# Patient Record
Sex: Female | Born: 1960 | Race: Black or African American | Hispanic: No | Marital: Single | Smoking: Current every day smoker
Health system: Southern US, Community
[De-identification: ages and names within clinical notes are randomized; demographics above are authoritative.]

## PROBLEM LIST (undated history)

## (undated) DIAGNOSIS — F99 Mental disorder, not otherwise specified: Secondary | ICD-10-CM

## (undated) DIAGNOSIS — J45909 Unspecified asthma, uncomplicated: Secondary | ICD-10-CM

## (undated) DIAGNOSIS — I10 Essential (primary) hypertension: Secondary | ICD-10-CM

## (undated) DIAGNOSIS — A0472 Enterocolitis due to Clostridium difficile, not specified as recurrent: Secondary | ICD-10-CM

## (undated) DIAGNOSIS — E119 Type 2 diabetes mellitus without complications: Secondary | ICD-10-CM

## (undated) HISTORY — PX: ABDOMINAL HYSTERECTOMY: SHX81

---

## 2008-09-29 ENCOUNTER — Inpatient Hospital Stay: Payer: Self-pay | Admitting: Unknown Physician Specialty

## 2008-12-18 ENCOUNTER — Emergency Department (HOSPITAL_COMMUNITY): Admission: EM | Admit: 2008-12-18 | Discharge: 2008-12-19 | Payer: Self-pay | Admitting: Emergency Medicine

## 2010-09-05 ENCOUNTER — Emergency Department (HOSPITAL_COMMUNITY): Payer: Self-pay

## 2010-09-05 ENCOUNTER — Emergency Department (HOSPITAL_COMMUNITY)
Admission: EM | Admit: 2010-09-05 | Discharge: 2010-09-05 | Disposition: A | Discharge status: Home / Self Care | Payer: Self-pay | Attending: Emergency Medicine | Admitting: Emergency Medicine

## 2010-09-05 DIAGNOSIS — F319 Bipolar disorder, unspecified: Secondary | ICD-10-CM | POA: Insufficient documentation

## 2010-09-05 DIAGNOSIS — R109 Unspecified abdominal pain: Secondary | ICD-10-CM | POA: Insufficient documentation

## 2010-09-05 DIAGNOSIS — R05 Cough: Secondary | ICD-10-CM | POA: Insufficient documentation

## 2010-09-05 DIAGNOSIS — R059 Cough, unspecified: Secondary | ICD-10-CM | POA: Insufficient documentation

## 2010-09-05 DIAGNOSIS — F17200 Nicotine dependence, unspecified, uncomplicated: Secondary | ICD-10-CM | POA: Insufficient documentation

## 2010-09-05 DIAGNOSIS — R0602 Shortness of breath: Secondary | ICD-10-CM | POA: Insufficient documentation

## 2010-09-05 DIAGNOSIS — R079 Chest pain, unspecified: Secondary | ICD-10-CM | POA: Insufficient documentation

## 2010-09-05 LAB — COMPREHENSIVE METABOLIC PANEL
ALT: 24 U/L (ref 0–35)
Alkaline Phosphatase: 83 U/L (ref 39–117)
CO2: 25 mEq/L (ref 19–32)
Glucose, Bld: 88 mg/dL (ref 70–99)
Potassium: 3.8 mEq/L (ref 3.5–5.1)
Sodium: 139 mEq/L (ref 135–145)
Total Protein: 7.7 g/dL (ref 6.0–8.3)

## 2010-09-05 LAB — POCT I-STAT, CHEM 8
Chloride: 112 mEq/L (ref 96–112)
HCT: 45 % (ref 36.0–46.0)
Potassium: 4 mEq/L (ref 3.5–5.1)

## 2010-09-05 LAB — DIFFERENTIAL
Lymphocytes Relative: 37 % (ref 12–46)
Lymphs Abs: 3 10*3/uL (ref 0.7–4.0)
Neutrophils Relative %: 53 % (ref 43–77)

## 2010-09-05 LAB — CBC
HCT: 42.8 % (ref 36.0–46.0)
MCV: 89.5 fL (ref 78.0–100.0)
RBC: 4.78 MIL/uL (ref 3.87–5.11)
WBC: 8 10*3/uL (ref 4.0–10.5)

## 2011-04-02 ENCOUNTER — Emergency Department (HOSPITAL_COMMUNITY)
Admission: EM | Admit: 2011-04-02 | Discharge: 2011-04-02 | Disposition: A | Discharge status: Other Acute Inpatient Hospital | Payer: Self-pay | Attending: Emergency Medicine | Admitting: Emergency Medicine

## 2011-04-02 ENCOUNTER — Inpatient Hospital Stay (HOSPITAL_COMMUNITY)
Admission: AD | Admit: 2011-04-02 | Discharge: 2011-04-06 | DRG: 885 | Disposition: A | Discharge status: Home / Self Care | Payer: Self-pay | Attending: Psychiatry | Admitting: Psychiatry

## 2011-04-02 DIAGNOSIS — F29 Unspecified psychosis not due to a substance or known physiological condition: Secondary | ICD-10-CM

## 2011-04-02 DIAGNOSIS — Z560 Unemployment, unspecified: Secondary | ICD-10-CM

## 2011-04-02 DIAGNOSIS — F319 Bipolar disorder, unspecified: Principal | ICD-10-CM

## 2011-04-02 DIAGNOSIS — R45850 Homicidal ideations: Secondary | ICD-10-CM

## 2011-04-02 DIAGNOSIS — Z8659 Personal history of other mental and behavioral disorders: Secondary | ICD-10-CM | POA: Insufficient documentation

## 2011-04-02 DIAGNOSIS — E669 Obesity, unspecified: Secondary | ICD-10-CM

## 2011-04-02 DIAGNOSIS — F259 Schizoaffective disorder, unspecified: Secondary | ICD-10-CM

## 2011-04-02 DIAGNOSIS — E876 Hypokalemia: Secondary | ICD-10-CM | POA: Insufficient documentation

## 2011-04-02 DIAGNOSIS — R45851 Suicidal ideations: Secondary | ICD-10-CM

## 2011-04-02 LAB — COMPREHENSIVE METABOLIC PANEL
ALT: 19 U/L (ref 0–35)
AST: 15 U/L (ref 0–37)
Albumin: 3.9 g/dL (ref 3.5–5.2)
BUN: 10 mg/dL (ref 6–23)
CO2: 26 mEq/L (ref 19–32)
Calcium: 9.3 mg/dL (ref 8.4–10.5)
Chloride: 103 mEq/L (ref 96–112)
Glucose, Bld: 93 mg/dL (ref 70–99)
Potassium: 3.4 mEq/L — ABNORMAL LOW (ref 3.5–5.1)
Total Protein: 7.4 g/dL (ref 6.0–8.3)

## 2011-04-02 LAB — DIFFERENTIAL
Basophils Relative: 0 % (ref 0–1)
Eosinophils Absolute: 0.2 10*3/uL (ref 0.0–0.7)
Monocytes Relative: 6 % (ref 3–12)
Neutrophils Relative %: 55 % (ref 43–77)

## 2011-04-02 LAB — CBC
MCH: 30.4 pg (ref 26.0–34.0)
MCHC: 33.7 g/dL (ref 30.0–36.0)
Platelets: 272 10*3/uL (ref 150–400)
RBC: 4.34 MIL/uL (ref 3.87–5.11)
RDW: 13.2 % (ref 11.5–15.5)

## 2011-04-02 LAB — RAPID URINE DRUG SCREEN, HOSP PERFORMED
Amphetamines: NOT DETECTED
Opiates: NOT DETECTED
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
## 2012-07-07 ENCOUNTER — Encounter (HOSPITAL_COMMUNITY): Payer: Self-pay | Admitting: *Deleted

## 2012-07-07 ENCOUNTER — Emergency Department (HOSPITAL_COMMUNITY)
Admission: EM | Admit: 2012-07-07 | Discharge: 2012-07-07 | Discharge status: Against Medical Advice | Payer: Self-pay | Attending: Emergency Medicine | Admitting: Emergency Medicine

## 2012-07-07 ENCOUNTER — Emergency Department (HOSPITAL_COMMUNITY): Payer: Self-pay

## 2012-07-07 DIAGNOSIS — R52 Pain, unspecified: Secondary | ICD-10-CM | POA: Insufficient documentation

## 2012-07-07 DIAGNOSIS — R112 Nausea with vomiting, unspecified: Secondary | ICD-10-CM | POA: Insufficient documentation

## 2012-07-07 DIAGNOSIS — R51 Headache: Secondary | ICD-10-CM | POA: Insufficient documentation

## 2012-07-07 DIAGNOSIS — R197 Diarrhea, unspecified: Secondary | ICD-10-CM | POA: Insufficient documentation

## 2012-07-07 HISTORY — DX: Mental disorder, not otherwise specified: F99

## 2012-07-07 LAB — CBC WITH DIFFERENTIAL/PLATELET
Eosinophils Relative: 0 % (ref 0–5)
HCT: 45.9 % (ref 36.0–46.0)
Hemoglobin: 15.7 g/dL — ABNORMAL HIGH (ref 12.0–15.0)
Lymphocytes Relative: 6 % — ABNORMAL LOW (ref 12–46)
Lymphs Abs: 0.6 10*3/uL — ABNORMAL LOW (ref 0.7–4.0)
MCV: 88.3 fL (ref 78.0–100.0)
Monocytes Absolute: 0.5 10*3/uL (ref 0.1–1.0)
Monocytes Relative: 6 % (ref 3–12)
Platelets: 247 10*3/uL (ref 150–400)
RBC: 5.2 MIL/uL — ABNORMAL HIGH (ref 3.87–5.11)
WBC: 9.6 10*3/uL (ref 4.0–10.5)

## 2012-07-07 LAB — COMPREHENSIVE METABOLIC PANEL
ALT: 17 U/L (ref 0–35)
BUN: 11 mg/dL (ref 6–23)
CO2: 22 mEq/L (ref 19–32)
Calcium: 9.6 mg/dL (ref 8.4–10.5)
GFR calc Af Amer: 65 mL/min — ABNORMAL LOW (ref >90)
GFR calc non Af Amer: 56 mL/min — ABNORMAL LOW (ref >90)
Glucose, Bld: 139 mg/dL — ABNORMAL HIGH (ref 70–99)
Sodium: 134 mEq/L — ABNORMAL LOW (ref 135–145)

## 2012-07-07 NOTE — ED Notes (Signed)
Pt reports N/V/D x 2 days.  Pt reports that she is having generalized body aches and HA.  Pt reports coughing up yellow sputum.  Pt crying, very poor historian.

## 2012-07-07 NOTE — ED Notes (Signed)
Unable to locate patient.

## 2013-08-31 ENCOUNTER — Emergency Department: Payer: Self-pay | Admitting: Emergency Medicine

## 2013-09-02 ENCOUNTER — Emergency Department: Payer: Self-pay | Admitting: Emergency Medicine

## 2013-09-04 LAB — WOUND CULTURE

## 2015-10-07 ENCOUNTER — Encounter: Payer: Self-pay | Admitting: Emergency Medicine

## 2015-10-07 ENCOUNTER — Emergency Department
Admission: EM | Admit: 2015-10-07 | Discharge: 2015-10-07 | Disposition: A | Discharge status: Home / Self Care | Payer: Self-pay | Attending: Emergency Medicine | Admitting: Emergency Medicine

## 2015-10-07 DIAGNOSIS — Y9389 Activity, other specified: Secondary | ICD-10-CM | POA: Insufficient documentation

## 2015-10-07 DIAGNOSIS — Y9289 Other specified places as the place of occurrence of the external cause: Secondary | ICD-10-CM | POA: Insufficient documentation

## 2015-10-07 DIAGNOSIS — S1096XA Insect bite of unspecified part of neck, initial encounter: Secondary | ICD-10-CM | POA: Insufficient documentation

## 2015-10-07 DIAGNOSIS — S80862A Insect bite (nonvenomous), left lower leg, initial encounter: Secondary | ICD-10-CM | POA: Insufficient documentation

## 2015-10-07 DIAGNOSIS — F17210 Nicotine dependence, cigarettes, uncomplicated: Secondary | ICD-10-CM | POA: Insufficient documentation

## 2015-10-07 DIAGNOSIS — S40861A Insect bite (nonvenomous) of right upper arm, initial encounter: Secondary | ICD-10-CM | POA: Insufficient documentation

## 2015-10-07 DIAGNOSIS — W57XXXA Bitten or stung by nonvenomous insect and other nonvenomous arthropods, initial encounter: Secondary | ICD-10-CM | POA: Insufficient documentation

## 2015-10-07 DIAGNOSIS — Y998 Other external cause status: Secondary | ICD-10-CM | POA: Insufficient documentation

## 2015-10-07 DIAGNOSIS — S40862A Insect bite (nonvenomous) of left upper arm, initial encounter: Secondary | ICD-10-CM | POA: Insufficient documentation

## 2015-10-07 DIAGNOSIS — I10 Essential (primary) hypertension: Secondary | ICD-10-CM | POA: Insufficient documentation

## 2015-10-07 DIAGNOSIS — S80861A Insect bite (nonvenomous), right lower leg, initial encounter: Secondary | ICD-10-CM | POA: Insufficient documentation

## 2015-10-07 HISTORY — DX: Essential (primary) hypertension: I10

## 2015-10-07 MED ORDER — HYDROXYZINE HCL 25 MG PO TABS
25.0000 mg | ORAL_TABLET | ORAL | Status: AC
  Administered 2015-10-07: 25 mg via ORAL
  Filled 2015-10-07: qty 1

## 2015-10-07 MED ORDER — HYDROXYZINE HCL 25 MG PO TABS
25.0000 mg | ORAL_TABLET | Freq: Three times a day (TID) | ORAL | Status: DC | PRN
Start: 2015-10-07 — End: 2016-01-14

## 2015-10-07 NOTE — ED Notes (Signed)
Patient presents to the ED with "bumps" on her arms, legs, and neck.  This RN had difficulty seeing bumps but patient reports itching and pain to areas.  Patient's skin is dark and it may be difficulty to visualize a rash.  Patient reports seeing a vision today where angels fought death for her.  Patient reports this is the only vision she has ever had.  Patient states, "nobody will believe me."  Patient denies auditory or visual hallucinations.  Patient denies any SI or HI.  Patient states, "I just want an exam."  This RN gave patient information regarding PCP.

## 2015-10-07 NOTE — ED Provider Notes (Signed)
[facility] Emergency Department Provider Note  ____________________________________________  Time seen: Approximately 8:26 PM  I have reviewed the triage vital signs and the nursing notes.   HISTORY  Chief Complaint Rash    HPI Pamela Lane is a 55 y.o. female with no specific past medical history except for unspecified mental disorder (when the patient is asked which one, she responded to the triage nurse "I have all of them mental disorders").  She presents today with a pruritic rash on her arms, legs, and neck.  She states that they came on acutely within the last day or 2.  Her husband is present and states that he has some bites as well and they are in the process of getting a new bed because they think that might be the problem.  She has had no fever/chills, chest pain, shortness of breath, abdominal pain, nausea, vomiting, diarrhea.  She states that the lesions are not painful, just itchy.  They are red and raised.  The itching is moderate and nothing makes it better or worse.  There are no lesions inside her mouth.  She has not started any new medications recently.   Past Medical History  Diagnosis Date  . Mental disorder     pt reports 'I have all of them mental disorders'  . Hypertension     There are no active problems to display for this patient.   Past Surgical History  Procedure Laterality Date  . Abdominal hysterectomy      Current Outpatient Rx  Name  Route  Sig  Dispense  Refill  . hydrOXYzine (ATARAX/VISTARIL) 25 MG tablet   Oral   Take 1 tablet (25 mg total) by mouth 3 (three) times daily as needed for itching.   30 tablet   0     Allergies Review of patient's allergies indicates no known allergies.  No family history on file.  Social History Social History  Substance Use Topics  . Smoking status: Current Every Day Smoker -- 1.00 packs/day    Types: Cigarettes  . Smokeless tobacco: None  . Alcohol Use: Yes   Comment: every once in a blue moon    Review of Systems Constitutional: No fever/chills Eyes: No visual changes. ENT: No sore throat. Cardiovascular: Denies chest pain. Respiratory: Denies shortness of breath. Gastrointestinal: No abdominal pain.  No nausea, no vomiting.  No diarrhea.  No constipation. Genitourinary: Negative for dysuria. Musculoskeletal: Negative for back pain. Skin: Itchy bumps to arms, legs, and neck Neurological: Negative for headaches, focal weakness or numbness.  10-point ROS otherwise negative.  ____________________________________________   PHYSICAL EXAM:  VITAL SIGNS: ED Triage Vitals  Enc Vitals Group     BP 10/07/15 1902 149/72 mmHg     Pulse Rate 10/07/15 1902 105     Resp 10/07/15 1902 20     Temp 10/07/15 1902 99.5 F (37.5 C)     Temp Source 10/07/15 1902 Oral     SpO2 10/07/15 1902 97 %     Weight 10/07/15 1902 235 lb (106.595 kg)     Height 10/07/15 1902 5\' 3"  (1.6 m)     Head Cir --      Peak Flow --      Pain Score 10/07/15 1906 8     Pain Loc --      Pain Edu? --      Excl. in GC? --     Constitutional: Alert and oriented. Well appearing and in no acute distress.  Eyes: Conjunctivae are normal. PERRL. EOMI. Head: Atraumatic. Nose: No congestion/rhinnorhea. Mouth/Throat: Mucous membranes are moist.  Oropharynx non-erythematous. Neck: No stridor.  No meningeal signs.   Cardiovascular: Normal rate, regular rhythm. Good peripheral circulation. Grossly normal heart sounds.   Respiratory: Normal respiratory effort.  No retractions. Lungs CTAB. Gastrointestinal: Soft and nontender. No distention.  Musculoskeletal: No lower extremity tenderness nor edema. No gross deformities of extremities. Neurologic:  Normal speech and language. No gross focal neurologic deficits are appreciated.  Skin:  Skin is warm, dry and intact. Individual erythematous, raised bumps that are most consistent with bug bites.  There is no cellulitis and no  purulence.  These are not abscesses.  There is no mucosal involvement. Psychiatric: Mood and affect are normal. Speech and behavior are normal.    ____________________________________________   LABS (all labs ordered are listed, but only abnormal results are displayed)  Labs Reviewed - No data to display ____________________________________________  EKG  None ____________________________________________  RADIOLOGY   No results found.  ____________________________________________   PROCEDURES  Procedure(s) performed: None  Critical Care performed: No ____________________________________________   INITIAL IMPRESSION / ASSESSMENT AND PLAN / ED COURSE  Pertinent labs & imaging results that were available during my care of the patient were reviewed by me and considered in my medical decision making (see chart for details).  Unremarkable pruritic rash most consistent with insect bites.  I gave my usual customary discussion about this type of rash, my usual recommendations, and follow-up advice.  The patient, although reporting some bizarre experiences to the triage nurse, did not bring this up with me and she is alert, appropriate, and exhibits no warning signs to me that we need to pursue a psychiatric workup.  ____________________________________________  FINAL CLINICAL IMPRESSION(S) / ED DIAGNOSES  Final diagnoses:  Insect bites      NEW MEDICATIONS STARTED DURING THIS VISIT:  New Prescriptions   HYDROXYZINE (ATARAX/VISTARIL) 25 MG TABLET    Take 1 tablet (25 mg total) by mouth 3 (three) times daily as needed for itching.      Note:  This document was prepared using Dragon voice recognition software and may include unintentional dictation errors.   Loleta Rose, MD 10/07/15 2053

## 2015-10-07 NOTE — Discharge Instructions (Signed)
We believe that your rash is likely a result of insect bites.  Please take her over-the-counter Benadryl or the prescribed Atarax, but not both.  Look for any source of infestation such as your bed which may be the source of the bugs.  Frequently it is a good idea to call a pest extermination service and have them evaluate your home for any potential issues.  Follow-up with the number listed below to establish a local primary care doctor.

## 2016-01-05 ENCOUNTER — Emergency Department: Payer: Self-pay

## 2016-01-05 ENCOUNTER — Other Ambulatory Visit: Payer: Self-pay

## 2016-01-05 ENCOUNTER — Emergency Department
Admission: EM | Admit: 2016-01-05 | Discharge: 2016-01-05 | Disposition: A | Discharge status: Home / Self Care | Payer: Self-pay | Attending: Student | Admitting: Student

## 2016-01-05 ENCOUNTER — Encounter: Payer: Self-pay | Admitting: Emergency Medicine

## 2016-01-05 DIAGNOSIS — N390 Urinary tract infection, site not specified: Secondary | ICD-10-CM | POA: Insufficient documentation

## 2016-01-05 DIAGNOSIS — F17210 Nicotine dependence, cigarettes, uncomplicated: Secondary | ICD-10-CM | POA: Insufficient documentation

## 2016-01-05 DIAGNOSIS — R739 Hyperglycemia, unspecified: Secondary | ICD-10-CM

## 2016-01-05 DIAGNOSIS — I10 Essential (primary) hypertension: Secondary | ICD-10-CM | POA: Insufficient documentation

## 2016-01-05 DIAGNOSIS — Z7984 Long term (current) use of oral hypoglycemic drugs: Secondary | ICD-10-CM | POA: Insufficient documentation

## 2016-01-05 DIAGNOSIS — F1290 Cannabis use, unspecified, uncomplicated: Secondary | ICD-10-CM | POA: Insufficient documentation

## 2016-01-05 DIAGNOSIS — E1165 Type 2 diabetes mellitus with hyperglycemia: Secondary | ICD-10-CM | POA: Insufficient documentation

## 2016-01-05 LAB — BASIC METABOLIC PANEL
ANION GAP: 13 (ref 5–15)
BUN: 12 mg/dL (ref 6–20)
CALCIUM: 9 mg/dL (ref 8.9–10.3)
CO2: 20 mmol/L — AB (ref 22–32)
Chloride: 95 mmol/L — ABNORMAL LOW (ref 101–111)
Creatinine, Ser: 0.91 mg/dL (ref 0.44–1.00)
GFR calc Af Amer: >60 mL/min (ref >60)
GLUCOSE: 562 mg/dL — AB (ref 65–99)
POTASSIUM: 3.6 mmol/L (ref 3.5–5.1)
Sodium: 128 mmol/L — ABNORMAL LOW (ref 135–145)

## 2016-01-05 LAB — BLOOD GAS, VENOUS
ACID-BASE DEFICIT: 4.4 mmol/L — AB (ref 0.0–2.0)
Bicarbonate: 21 mEq/L (ref 21.0–28.0)
O2 SAT: 64.7 %
PCO2 VEN: 39 mmHg — AB (ref 44.0–60.0)
Patient temperature: 37
pH, Ven: 7.34 (ref 7.320–7.430)
pO2, Ven: 36 mmHg (ref 31.0–45.0)

## 2016-01-05 LAB — CBC
HEMATOCRIT: 39.6 % (ref 35.0–47.0)
HEMOGLOBIN: 14 g/dL (ref 12.0–16.0)
MCH: 30.4 pg (ref 26.0–34.0)
MCHC: 35.3 g/dL (ref 32.0–36.0)
MCV: 86.1 fL (ref 80.0–100.0)
Platelets: 272 10*3/uL (ref 150–440)
RBC: 4.6 MIL/uL (ref 3.80–5.20)
RDW: 12.9 % (ref 11.5–14.5)
WBC: 9.4 10*3/uL (ref 3.6–11.0)

## 2016-01-05 LAB — HEPATIC FUNCTION PANEL
ALBUMIN: 4.2 g/dL (ref 3.5–5.0)
ALK PHOS: 109 U/L (ref 38–126)
ALT: 18 U/L (ref 14–54)
AST: 13 U/L — AB (ref 15–41)
BILIRUBIN TOTAL: 0.6 mg/dL (ref 0.3–1.2)
Bilirubin, Direct: <0.1 mg/dL — ABNORMAL LOW (ref 0.1–0.5)
TOTAL PROTEIN: 7.6 g/dL (ref 6.5–8.1)

## 2016-01-05 LAB — GLUCOSE, CAPILLARY
GLUCOSE-CAPILLARY: 398 mg/dL — AB (ref 65–99)
GLUCOSE-CAPILLARY: 344 mg/dL — AB (ref 65–99)

## 2016-01-05 LAB — TROPONIN I: Troponin I: <0.03 ng/mL (ref <0.031)

## 2016-01-05 MED ORDER — BLOOD GLUCOSE MONITOR KIT: PACK | Status: DC

## 2016-01-05 MED ORDER — INSULIN ASPART 100 UNIT/ML ~~LOC~~ SOLN
8.0000 [IU] | Freq: Once | SUBCUTANEOUS | Status: AC
  Administered 2016-01-05: 8 [IU] via SUBCUTANEOUS
  Filled 2016-01-05: qty 8

## 2016-01-05 MED ORDER — SODIUM CHLORIDE 0.9 % IV BOLUS (SEPSIS)
1000.0000 mL | Freq: Once | INTRAVENOUS | Status: AC
  Administered 2016-01-05: 1000 mL via INTRAVENOUS

## 2016-01-05 MED ORDER — METFORMIN HCL 500 MG PO TABS: 500.0000 mg | ORAL_TABLET | Freq: Every day | ORAL | Status: DC

## 2016-01-05 MED ORDER — NITROFURANTOIN MONOHYD MACRO 100 MG PO CAPS
100.0000 mg | ORAL_CAPSULE | Freq: Once | ORAL | Status: AC
  Administered 2016-01-05: 100 mg via ORAL

## 2016-01-05 MED ORDER — NITROFURANTOIN MONOHYD MACRO 100 MG PO CAPS
ORAL_CAPSULE | ORAL | Status: AC
  Administered 2016-01-05: 100 mg via ORAL
  Filled 2016-01-05: qty 1

## 2016-01-05 MED ORDER — NITROFURANTOIN MONOHYD MACRO 100 MG PO CAPS: 100.0000 mg | ORAL_CAPSULE | Freq: Two times a day (BID) | ORAL | Status: DC

## 2016-01-05 NOTE — ED Notes (Signed)
Blurred vision and frequent urination x 3 weeks.  [facility] CBG read "HI".

## 2016-01-05 NOTE — ED Provider Notes (Signed)
[facility] Emergency Department Provider Note   ____________________________________________  Time seen: Approximately 6:00 PM  I have reviewed the triage vital signs and the nursing notes.   HISTORY  Chief Complaint Hyperglycemia    HPI ARIAM MOL is a 55 y.o. female no specific past medical history except for HTN and unspecified mental disorder (when the patient is asked which one, she responds "I have all of them mental disorders" who presents for evaluation of several weeks of worsening vision, increased frequency of urination, gradual onset, constant, no modifying factors, moderate. The patient reports that over the past several weeks she has had these symptoms, she told her friend about them today and her friend checked her blood sugar which read as "high". They called EMS and her sugar again read "high". She denies any chest pain, vomiting, diarrhea, pain or burning with urination. She denies any prior history of diabetes. She has been sick with cough recently but denies any shortness of breath. No abdominal pain.   Past Medical History  Diagnosis Date  . Mental disorder     pt reports 'I have all of them mental disorders'  . Hypertension     There are no active problems to display for this patient.   Past Surgical History  Procedure Laterality Date  . Abdominal hysterectomy      Current Outpatient Rx  Name  Route  Sig  Dispense  Refill  . blood glucose meter kit and supplies KIT      Dispense based on patient and insurance preference. Use up to four times daily as directed. (FOR ICD-9 250.00, 250.01).   1 each   0   . hydrOXYzine (ATARAX/VISTARIL) 25 MG tablet   Oral   Take 1 tablet (25 mg total) by mouth 3 (three) times daily as needed for itching.   30 tablet   0   . metFORMIN (GLUCOPHAGE) 500 MG tablet   Oral   Take 1 tablet (500 mg total) by mouth daily.   30 tablet   0   . nitrofurantoin, macrocrystal-monohydrate,  (MACROBID) 100 MG capsule   Oral   Take 1 capsule (100 mg total) by mouth 2 (two) times daily.   14 capsule   0     Allergies Review of patient's allergies indicates no known allergies.  No family history on file.  Social History Social History  Substance Use Topics  . Smoking status: Current Every Day Smoker -- 1.00 packs/day    Types: Cigarettes  . Smokeless tobacco: None  . Alcohol Use: Yes     Comment: every once in a blue moon    Review of Systems Constitutional: No fever/chills Eyes: + visual changes. ENT: No sore throat. Cardiovascular: Denies chest pain. Respiratory: Denies shortness of breath. Gastrointestinal: No abdominal pain.  No nausea, no vomiting.  No diarrhea.  No constipation. Genitourinary: Negative for dysuria. Musculoskeletal: Negative for back pain. Skin: Negative for rash. Neurological: Negative for headaches, focal weakness or numbness.  10-point ROS otherwise negative.  ____________________________________________   PHYSICAL EXAM:  VITAL SIGNS: ED Triage Vitals  Enc Vitals Group     BP 01/05/16 1729 145/85 mmHg     Pulse Rate 01/05/16 1729 110     Resp 01/05/16 1729 20     Temp --      Temp src --      SpO2 01/05/16 1729 96 %     Weight 01/05/16 1729 229 lb (103.874 kg)     Height  01/05/16 1729 '5\' 3"'  (1.6 m)     Head Cir --      Peak Flow --      Pain Score 01/05/16 1728 0     Pain Loc --      Pain Edu? --      Excl. in [place]? --     Constitutional: Alert and oriented. Well appearing and in no acute distress. Eyes: Conjunctivae are normal. PERRL. EOMI. Head: Atraumatic. Nose: No congestion/rhinnorhea. Mouth/Throat: Mucous membranes are moist.  Oropharynx non-erythematous. Neck: No stridor.   Cardiovascular: mildly tachycardic rate, regular rhythm. Grossly normal heart sounds.  Good peripheral circulation. Respiratory: Normal respiratory effort.  No retractions. Lungs CTAB. Gastrointestinal: Soft and nontender. No distention.   No CVA tenderness. Genitourinary: deferred Musculoskeletal: No lower extremity tenderness nor edema.  No joint effusions. Neurologic:  Normal speech and language. No gross focal neurologic deficits are appreciated. No gait instability. Skin:  Skin is warm, dry and intact. No rash noted. Psychiatric: Mood and affect are normal. Speech and behavior are normal.  ____________________________________________   LABS (all labs ordered are listed, but only abnormal results are displayed)  Labs Reviewed  BASIC METABOLIC PANEL - Abnormal; Notable for the following:    Sodium 128 (*)    Chloride 95 (*)    CO2 20 (*)    Glucose, Bld 562 (*)    All other components within normal limits  URINALYSIS COMPLETEWITH MICROSCOPIC ([facility] ONLY) - Abnormal; Notable for the following:    Color, Urine STRAW (*)    APPearance CLEAR (*)    Glucose, UA >500 (*)    Ketones, ur 1+ (*)    Hgb urine dipstick 1+ (*)    Nitrite POSITIVE (*)    Leukocytes, UA TRACE (*)    All other components within normal limits  HEPATIC FUNCTION PANEL - Abnormal; Notable for the following:    AST 13 (*)    Bilirubin, Direct <0.1 (*)    All other components within normal limits  BLOOD GAS, VENOUS - Abnormal; Notable for the following:    pCO2, Ven 39 (*)    Acid-base deficit 4.4 (*)    All other components within normal limits  GLUCOSE, CAPILLARY - Abnormal; Notable for the following:    Glucose-Capillary 398 (*)    All other components within normal limits  GLUCOSE, CAPILLARY - Abnormal; Notable for the following:    Glucose-Capillary 344 (*)    All other components within normal limits  URINE CULTURE  CBC  TROPONIN I  CBG MONITORING, ED   ____________________________________________  EKG  ED ECG REPORT I, Joanne Gavel, the attending physician, personally viewed and interpreted this ECG.   Date: 01/05/2016  EKG Time: 19:25  Rate: 100  Rhythm: sinus tachycardia  Axis: normal  Intervals:none  ST&T Change:  No acute ST elevation. Normal QTC.  ____________________________________________  RADIOLOGY  CXR IMPRESSION: No active cardiopulmonary disease. ____________________________________________   PROCEDURES  Procedure(s) performed: None  Critical Care performed: No  ____________________________________________   INITIAL IMPRESSION / ASSESSMENT AND PLAN / ED COURSE  Pertinent labs & imaging results that were available during my care of the patient were reviewed by me and considered in my medical decision making (see chart for details).  BIANCE MONCRIEF is a 55 y.o. female no specific past medical history except for HTN and unspecified mental disorder (when the patient is asked which one, she responds "I have all of them mental disorders" who presents for evaluation of several weeks of  worsening vision, increased frequency of urination in the setting of very high blood glucose today. On exam, she is well-appearing and in no acute distress, she is mildly tachycardic with a rate of her vital signs are stable she is afebrile. She is a benign physical examination, intact neurological examination, suspect new onset diabetes, we'll obtain screening labs, chest x-ray, urinalysis, give IV fluids and insulin, reassess for disposition.  ----------------------------------------- 10:11 PM on 01/05/2016 ----------------------------------------- Patient reports she feels well, her vital signs have normalized. Sugar improved to 344. Her BMP on arrival showed glucose of 562 as well as pseudohyponatremia which corrects appropriately for her degree of hyperglycemia. VBG without evidence of DKA. Urinalysis is concerning for nitrite positive UTI. Normal CBC. Negative troponin. We'll give Macrobid. I discussed with her that I'm concerned that she has new onset diabetes. We discussed starting daily metformin and we discussed need for close primary care follow-up. We'll DC with return precautions, prescriptions  for Macrobid, glucose monitoring kit, and metformin. We discussed meticulous return precautions and she is comfortable with the discharge plan. DC home.  ____________________________________________   FINAL CLINICAL IMPRESSION(S) / ED DIAGNOSES  Final diagnoses:  UTI (lower urinary tract infection)  Hyperglycemia      NEW MEDICATIONS STARTED DURING THIS VISIT:  New Prescriptions   BLOOD GLUCOSE METER KIT AND SUPPLIES KIT    Dispense based on patient and insurance preference. Use up to four times daily as directed. (FOR ICD-9 250.00, 250.01).   METFORMIN (GLUCOPHAGE) 500 MG TABLET    Take 1 tablet (500 mg total) by mouth daily.   NITROFURANTOIN, MACROCRYSTAL-MONOHYDRATE, (MACROBID) 100 MG CAPSULE    Take 1 capsule (100 mg total) by mouth 2 (two) times daily.     Note:  This document was prepared using Dragon voice recognition software and may include unintentional dictation errors.    Joanne Gavel, MD 01/05/16 2220

## 2016-01-07 LAB — URINE CULTURE

## 2016-01-08 ENCOUNTER — Inpatient Hospital Stay
Admission: EM | Admit: 2016-01-08 | Discharge: 2016-01-09 | DRG: 638 | Disposition: A | Discharge status: Home / Self Care | Payer: Self-pay | Attending: Internal Medicine | Admitting: Internal Medicine

## 2016-01-08 ENCOUNTER — Encounter: Payer: Self-pay | Admitting: Emergency Medicine

## 2016-01-08 DIAGNOSIS — I10 Essential (primary) hypertension: Secondary | ICD-10-CM | POA: Diagnosis present

## 2016-01-08 DIAGNOSIS — E860 Dehydration: Secondary | ICD-10-CM

## 2016-01-08 DIAGNOSIS — R1010 Upper abdominal pain, unspecified: Secondary | ICD-10-CM

## 2016-01-08 DIAGNOSIS — F17210 Nicotine dependence, cigarettes, uncomplicated: Secondary | ICD-10-CM | POA: Diagnosis present

## 2016-01-08 DIAGNOSIS — Z7984 Long term (current) use of oral hypoglycemic drugs: Secondary | ICD-10-CM

## 2016-01-08 DIAGNOSIS — N179 Acute kidney failure, unspecified: Secondary | ICD-10-CM | POA: Diagnosis present

## 2016-01-08 DIAGNOSIS — F319 Bipolar disorder, unspecified: Secondary | ICD-10-CM | POA: Diagnosis present

## 2016-01-08 DIAGNOSIS — R112 Nausea with vomiting, unspecified: Secondary | ICD-10-CM

## 2016-01-08 DIAGNOSIS — Z9119 Patient's noncompliance with other medical treatment and regimen: Secondary | ICD-10-CM

## 2016-01-08 DIAGNOSIS — N390 Urinary tract infection, site not specified: Secondary | ICD-10-CM

## 2016-01-08 DIAGNOSIS — E1165 Type 2 diabetes mellitus with hyperglycemia: Principal | ICD-10-CM | POA: Diagnosis present

## 2016-01-08 DIAGNOSIS — E1110 Type 2 diabetes mellitus with ketoacidosis without coma: Secondary | ICD-10-CM

## 2016-01-08 DIAGNOSIS — Z79899 Other long term (current) drug therapy: Secondary | ICD-10-CM

## 2016-01-08 DIAGNOSIS — H538 Other visual disturbances: Secondary | ICD-10-CM | POA: Diagnosis present

## 2016-01-08 DIAGNOSIS — E871 Hypo-osmolality and hyponatremia: Secondary | ICD-10-CM

## 2016-01-08 DIAGNOSIS — E876 Hypokalemia: Secondary | ICD-10-CM | POA: Diagnosis present

## 2016-01-08 LAB — BASIC METABOLIC PANEL
ANION GAP: 14 (ref 5–15)
BUN: 14 mg/dL (ref 6–20)
CALCIUM: 9.9 mg/dL (ref 8.9–10.3)
CO2: 15 mmol/L — ABNORMAL LOW (ref 22–32)
Chloride: 102 mmol/L (ref 101–111)
Creatinine, Ser: 0.87 mg/dL (ref 0.44–1.00)
Glucose, Bld: 498 mg/dL — ABNORMAL HIGH (ref 65–99)
Potassium: 4.1 mmol/L (ref 3.5–5.1)
Sodium: 131 mmol/L — ABNORMAL LOW (ref 135–145)

## 2016-01-08 LAB — HEPATIC FUNCTION PANEL
ALBUMIN: 4.6 g/dL (ref 3.5–5.0)
ALK PHOS: 109 U/L (ref 38–126)
ALT: 16 U/L (ref 14–54)
AST: 12 U/L — ABNORMAL LOW (ref 15–41)
Bilirubin, Direct: <0.1 mg/dL — ABNORMAL LOW (ref 0.1–0.5)
TOTAL PROTEIN: 8.4 g/dL — AB (ref 6.5–8.1)
Total Bilirubin: 0.4 mg/dL (ref 0.3–1.2)

## 2016-01-08 LAB — URINALYSIS COMPLETE WITH MICROSCOPIC (ARMC ONLY)
BILIRUBIN URINE: NEGATIVE
Glucose, UA: >500 mg/dL — AB
Nitrite: NEGATIVE
PH: 6 (ref 5.0–8.0)
PROTEIN: NEGATIVE mg/dL
Specific Gravity, Urine: 1.027 (ref 1.005–1.030)

## 2016-01-08 LAB — CBC
HCT: 44.1 % (ref 35.0–47.0)
HEMOGLOBIN: 15.1 g/dL (ref 12.0–16.0)
MCH: 30.1 pg (ref 26.0–34.0)
MCHC: 34.2 g/dL (ref 32.0–36.0)
MCV: 87.9 fL (ref 80.0–100.0)
Platelets: 271 10*3/uL (ref 150–440)
RBC: 5.02 MIL/uL (ref 3.80–5.20)
RDW: 13 % (ref 11.5–14.5)
WBC: 9.7 10*3/uL (ref 3.6–11.0)

## 2016-01-08 LAB — GLUCOSE, CAPILLARY
GLUCOSE-CAPILLARY: 141 mg/dL — AB (ref 65–99)
GLUCOSE-CAPILLARY: 175 mg/dL — AB (ref 65–99)
GLUCOSE-CAPILLARY: 336 mg/dL — AB (ref 65–99)
GLUCOSE-CAPILLARY: 476 mg/dL — AB (ref 65–99)
Glucose-Capillary: 389 mg/dL — ABNORMAL HIGH (ref 65–99)
Glucose-Capillary: 139 mg/dL — ABNORMAL HIGH (ref 65–99)
Glucose-Capillary: 164 mg/dL — ABNORMAL HIGH (ref 65–99)
Glucose-Capillary: 159 mg/dL — ABNORMAL HIGH (ref 65–99)

## 2016-01-08 LAB — TSH: TSH: 0.519 u[IU]/mL (ref 0.350–4.500)

## 2016-01-08 LAB — MRSA PCR SCREENING: MRSA by PCR: NEGATIVE

## 2016-01-08 LAB — TROPONIN I

## 2016-01-08 LAB — LIPASE, BLOOD: LIPASE: 32 U/L (ref 11–51)

## 2016-01-08 MED ORDER — ACETAMINOPHEN 650 MG RE SUPP: 650.0000 mg | Freq: Four times a day (QID) | RECTAL | Status: DC | PRN

## 2016-01-08 MED ORDER — DEXTROSE 5 % IV SOLN
1.0000 g | INTRAVENOUS | Status: DC
  Filled 2016-01-08: qty 10

## 2016-01-08 MED ORDER — DEXTROSE 5 % IV SOLN
1.0000 g | Freq: Once | INTRAVENOUS | Status: AC
  Administered 2016-01-08: 1 g via INTRAVENOUS
  Filled 2016-01-08: qty 10

## 2016-01-08 MED ORDER — DEXTROSE-NACL 5-0.45 % IV SOLN
INTRAVENOUS | Status: DC
  Administered 2016-01-09 (×2): via INTRAVENOUS

## 2016-01-08 MED ORDER — DEXTROSE-NACL 5-0.45 % IV SOLN: INTRAVENOUS | Status: DC

## 2016-01-08 MED ORDER — LISINOPRIL 5 MG PO TABS
5.0000 mg | ORAL_TABLET | Freq: Every day | ORAL | Status: DC
  Administered 2016-01-09: 5 mg via ORAL
  Filled 2016-01-08: qty 1

## 2016-01-08 MED ORDER — ENOXAPARIN SODIUM 40 MG/0.4ML ~~LOC~~ SOLN
40.0000 mg | Freq: Two times a day (BID) | SUBCUTANEOUS | Status: DC
  Administered 2016-01-08 – 2016-01-09 (×2): 40 mg via SUBCUTANEOUS
  Filled 2016-01-08 (×2): qty 0.4

## 2016-01-08 MED ORDER — SODIUM CHLORIDE 0.9 % IV BOLUS (SEPSIS)
1000.0000 mL | Freq: Once | INTRAVENOUS | Status: AC
  Administered 2016-01-08: 1000 mL via INTRAVENOUS

## 2016-01-08 MED ORDER — SODIUM CHLORIDE 0.9 % IV SOLN
INTRAVENOUS | Status: DC
  Administered 2016-01-08: 3.2 [IU]/h via INTRAVENOUS
  Filled 2016-01-08: qty 2.5

## 2016-01-08 MED ORDER — SODIUM CHLORIDE 0.9 % IV SOLN
Freq: Once | INTRAVENOUS | Status: AC
  Administered 2016-01-08: 15:00:00 via INTRAVENOUS

## 2016-01-08 MED ORDER — NICOTINE 21 MG/24HR TD PT24
21.0000 mg | MEDICATED_PATCH | Freq: Every day | TRANSDERMAL | Status: DC
Start: 2016-01-08 — End: 2016-01-09
  Administered 2016-01-09: 21 mg via TRANSDERMAL
  Filled 2016-01-08: qty 1

## 2016-01-08 MED ORDER — SODIUM CHLORIDE 0.9% FLUSH
3.0000 mL | Freq: Two times a day (BID) | INTRAVENOUS | Status: DC
  Administered 2016-01-08 – 2016-01-09 (×2): 3 mL via INTRAVENOUS

## 2016-01-08 MED ORDER — ONDANSETRON HCL 4 MG/2ML IJ SOLN
4.0000 mg | Freq: Once | INTRAMUSCULAR | Status: AC
  Administered 2016-01-08: 4 mg via INTRAVENOUS
  Filled 2016-01-08: qty 2

## 2016-01-08 MED ORDER — PANTOPRAZOLE SODIUM 40 MG PO TBEC
40.0000 mg | DELAYED_RELEASE_TABLET | Freq: Every day | ORAL | Status: DC
  Administered 2016-01-09: 40 mg via ORAL
  Filled 2016-01-08: qty 1

## 2016-01-08 MED ORDER — ONDANSETRON HCL 4 MG/2ML IJ SOLN
4.0000 mg | Freq: Four times a day (QID) | INTRAMUSCULAR | Status: DC | PRN
  Administered 2016-01-08: 4 mg via INTRAVENOUS
  Filled 2016-01-08: qty 2

## 2016-01-08 MED ORDER — ACETAMINOPHEN 325 MG PO TABS
650.0000 mg | ORAL_TABLET | Freq: Four times a day (QID) | ORAL | Status: DC | PRN
  Administered 2016-01-09: 650 mg via ORAL
  Filled 2016-01-08: qty 2

## 2016-01-08 MED ORDER — ONDANSETRON HCL 4 MG PO TABS: 4.0000 mg | ORAL_TABLET | Freq: Four times a day (QID) | ORAL | Status: DC | PRN

## 2016-01-08 NOTE — ED Notes (Signed)
Assisted pt to toilet 

## 2016-01-08 NOTE — ED Notes (Addendum)
cbg 389; glucostabilizer started

## 2016-01-08 NOTE — ED Notes (Signed)
Triage was completed by this nurse; accidentally entered under Cates, L.

## 2016-01-08 NOTE — ED Notes (Signed)
Pt presents stating that she is feeling dizzy & her vision is blurry. States that she was seen here on the 26th and was told her blood sugar was over 500. She reports that it was 375 today. States that she does not take diabetes medication and that she does not have a PCP. Pt alert & oriented with NAD noted.

## 2016-01-08 NOTE — ED Provider Notes (Signed)
CSN: [id number]     Arrival date & time 01/08/16  1134 History   First MD Initiated Contact with Patient 01/08/16 1509     Chief Complaint  Patient presents with  . Dizziness  . Blurred Vision     (Consider location/radiation/quality/duration/timing/severity/associated sxs/prior Treatment) The history is provided by the patient.  MAKALEIGH REINARD is a 55 y.o. female hx of HTN, unspecified mental disorder, Here presenting with blurry vision, decreased urination, vomiting. Symptoms going on for several months and getting worse. He was seen here 3 days ago and was diagnosed with new-onset diabetes as well as UTI. Was described metformin but did not take it. She was prescribed Macrobid but states that she still having frequent urination And burning with urination. As intermittent abdominal pain as well and persistent vomiting and can't keep anything down. Has subjective chills but no fevers.        Past Medical History  Diagnosis Date  . Mental disorder     pt reports 'I have all of them mental disorders'  . Hypertension    Past Surgical History  Procedure Laterality Date  . Abdominal hysterectomy     History reviewed. No pertinent family history. Social History  Substance Use Topics  . Smoking status: Current Every Day Smoker -- 1.00 packs/day    Types: Cigarettes  . Smokeless tobacco: None  . Alcohol Use: No     Comment: every once in a blue moon   OB History    No data available     Review of Systems  Gastrointestinal: Positive for vomiting and abdominal pain.  Neurological: Positive for dizziness.  All other systems reviewed and are negative.     Allergies  Review of patient's allergies indicates no known allergies.  Home Medications   Prior to Admission medications   Medication Sig Start Date End Date Taking? Authorizing Provider  blood glucose meter kit and supplies KIT Dispense based on patient and insurance preference. Use up to four times daily as directed.  (FOR ICD-9 250.00, 250.01). 01/05/16   Joanne Gavel, MD  hydrOXYzine (ATARAX/VISTARIL) 25 MG tablet Take 1 tablet (25 mg total) by mouth 3 (three) times daily as needed for itching. 10/07/15   Hinda Kehr, MD  metFORMIN (GLUCOPHAGE) 500 MG tablet Take 1 tablet (500 mg total) by mouth daily. 01/05/16 01/04/17  Joanne Gavel, MD  nitrofurantoin, macrocrystal-monohydrate, (MACROBID) 100 MG capsule Take 1 capsule (100 mg total) by mouth 2 (two) times daily. 01/05/16   Joanne Gavel, MD   BP 154/93 mmHg  Pulse 97  Temp(Src) 97.3 F (36.3 C) (Oral)  Resp 21  Ht '5\' 3"'  (1.6 m)  Wt 229 lb (103.874 kg)  BMI 40.58 kg/m2  SpO2 99% Physical Exam  Constitutional: She is oriented to person, place, and time.  Dehydrated   HENT:  Head: Normocephalic.  MM dry   Eyes: Conjunctivae are normal. Pupils are equal, round, and reactive to light.  Neck: Normal range of motion. Neck supple.  Cardiovascular: Normal rate, regular rhythm and normal heart sounds.   Pulmonary/Chest: Effort normal and breath sounds normal. No respiratory distress. She has no wheezes. She has no rales.  Abdominal: Soft. Bowel sounds are normal.  + epigastric tenderness, no rebound. No CVAT   Musculoskeletal: Normal range of motion. She exhibits no edema or tenderness.  Neurological: She is alert and oriented to person, place, and time. No cranial nerve deficit. Coordination normal.  Skin: Skin is warm and dry.  Psychiatric: She  has a normal mood and affect. Her behavior is normal. Judgment and thought content normal.  Nursing note and vitals reviewed.   ED Course  Procedures (including critical care time)  CRITICAL CARE Performed by: Darl Householder, DAVID   Total critical care time: 30 minutes  Critical care time was exclusive of separately billable procedures and treating other patients.  Critical care was necessary to treat or prevent imminent or life-threatening deterioration.  Critical care was time spent personally by me on the  following activities: development of treatment plan with patient and/or surrogate as well as nursing, discussions with consultants, evaluation of patient's response to treatment, examination of patient, obtaining history from patient or surrogate, ordering and performing treatments and interventions, ordering and review of laboratory studies, ordering and review of radiographic studies, pulse oximetry and re-evaluation of patient's condition.   Labs Review Labs Reviewed  BASIC METABOLIC PANEL - Abnormal; Notable for the following:    Sodium 131 (*)    CO2 15 (*)    Glucose, Bld 498 (*)    All other components within normal limits  URINALYSIS COMPLETEWITH MICROSCOPIC ([facility] ONLY) - Abnormal; Notable for the following:    Color, Urine YELLOW (*)    APPearance HAZY (*)    Glucose, UA >500 (*)    Ketones, ur 2+ (*)    Hgb urine dipstick 2+ (*)    Leukocytes, UA 2+ (*)    Bacteria, UA RARE (*)    Squamous Epithelial / LPF 0-5 (*)    All other components within normal limits  GLUCOSE, CAPILLARY - Abnormal; Notable for the following:    Glucose-Capillary 476 (*)    All other components within normal limits  CBC  HEPATIC FUNCTION PANEL  LIPASE, BLOOD  CBG MONITORING, ED    Imaging Review No results found. I have personally reviewed and evaluated these images and lab results as part of my medical decision-making.   EKG Interpretation None       ED ECG REPORT I, YAO, DAVID, the attending physician, personally viewed and interpreted this ECG.   Date: 01/08/2016  EKG Time: 11:56 am  Rate: 99  Rhythm: normal EKG, normal sinus rhythm  Axis: normal  Intervals:none  ST&T Change: none  MDM   Final diagnoses:  None    KIARI HOSMER is a 56 y.o. female here with blurry vision, abdominal pain, vomiting. CBG was 476. Concerned for DKA vs hyperosmolar. Will check labs, UA. Will hydrate and reassess.   3:34 PM Labs showed bicarb 15 with AG 14. UA +UTI and ketones. Given 1 L  NS bolus and zofran and still nauseated. Started on insulin drip for DKA. Given rocephin, urine culture sent. Will admit.      Wandra Arthurs, MD 01/08/16 1535

## 2016-01-08 NOTE — ED Notes (Signed)
Pt reports that she has not had metformin prescription filled. States that she can't get [facility] until August and she doesn't want to wait that long. Explained to pt that she should go ahead and make appointment so she can get started on follow up care.

## 2016-01-08 NOTE — ED Notes (Signed)
Lab to add on hepatic function and lipase 

## 2016-01-08 NOTE — ED Notes (Signed)
Informed RN bed ready  1714

## 2016-01-08 NOTE — H&P (Addendum)
[place] [facility] [place] NAME: Pamela Lane    MR#:  [id number]  DATE OF BIRTH:  Mar 09, 1961  DATE OF ADMISSION:  01/08/2016  PRIMARY CARE PHYSICIAN: No PCP Per Patient   REQUESTING/REFERRING PHYSICIAN:   CHIEF COMPLAINT:   Chief Complaint  Patient presents with  . Dizziness  . Blurred Vision    HISTORY OF PRESENT ILLNESS: Pamela Lane  is a 55 y.o. female with a known history of Essential hypertension, mental disorder of unclear entity, recent diagnosis of diabetes mellitus about 3 days ago, also urinary tract infection,  for which she was discharged from the emergency room . He days ago with nitrofurantoin and metformin comes back to emergency room was complains of blurry vision, increased frequency of urination, dehydration, intermittent nausea and vomiting, upper abdominal pain. On arrival to emergency room, patient was hypertensive with systolic blood pressure is around 150-170s. Labs revealed a DKA, hyponatremia, pyuria. Hospitalist services were contacted for admission  PAST MEDICAL HISTORY:   Past Medical History  Diagnosis Date  . Mental disorder     pt reports 'I have all of them mental disorders'  . Hypertension     PAST SURGICAL HISTORY: Past Surgical History  Procedure Laterality Date  . Abdominal hysterectomy      SOCIAL HISTORY:  Social History  Substance Use Topics  . Smoking status: Current Every Day Smoker -- 1.00 packs/day    Types: Cigarettes  . Smokeless tobacco: Not on file  . Alcohol Use: No     Comment: every once in a blue moon    FAMILY HISTORY: No early coronary artery disease  DRUG ALLERGIES: No Known Allergies  Review of Systems  Constitutional: Positive for chills and malaise/fatigue. Negative for fever and weight loss.  HENT: Positive for congestion.   Eyes: Positive for blurred vision and double vision.  Respiratory: Positive for cough, sputum production and shortness of breath.  Negative for wheezing.   Cardiovascular: Positive for chest pain and palpitations. Negative for orthopnea, leg swelling and PND.  Gastrointestinal: Positive for nausea, vomiting, abdominal pain and diarrhea. Negative for constipation, blood in stool and melena.  Genitourinary: Positive for dysuria, urgency, frequency and hematuria.  Musculoskeletal: Negative for falls.  Skin: Positive for itching. Negative for rash.  Neurological: Positive for weakness. Negative for dizziness.  Psychiatric/Behavioral: Negative for depression and memory loss. The patient is not nervous/anxious.     MEDICATIONS AT HOME:  Prior to Admission medications   Medication Sig Start Date End Date Taking? Authorizing Provider  hydrOXYzine (ATARAX/VISTARIL) 25 MG tablet Take 1 tablet (25 mg total) by mouth 3 (three) times daily as needed for itching. 10/07/15  Yes Hinda Kehr, MD  metFORMIN (GLUCOPHAGE) 500 MG tablet Take 1 tablet (500 mg total) by mouth daily. 01/05/16 01/04/17 Yes Joanne Gavel, MD  nitrofurantoin, macrocrystal-monohydrate, (MACROBID) 100 MG capsule Take 1 capsule (100 mg total) by mouth 2 (two) times daily. 01/05/16  Yes Joanne Gavel, MD  blood glucose meter kit and supplies KIT Dispense based on patient and insurance preference. Use up to four times daily as directed. (FOR ICD-9 250.00, 250.01). 01/05/16   Joanne Gavel, MD      PHYSICAL EXAMINATION:   VITAL SIGNS: Blood pressure 154/93, pulse 97, temperature 97.3 F (36.3 C), temperature source Oral, resp. rate 21, height _0  (1.6 m), weight 103.874 kg (229 lb), SpO2 99 %.  GENERAL:  55 y.o.-year-old patient lying in the bed in mild to  moderate distress due to poor vision. Dry oral mucosa EYES: Pupils equal, round, reactive to light and accommodation. No scleral icterus. Extraocular muscles intact.  HEENT: Head atraumatic, normocephalic. Oropharynx and nasopharynx clear.  NECK:  Supple, no jugular venous distention. No thyroid enlargement, no  tenderness.  LUNGS: Some diminished breath sounds bilaterally, no wheezing, rales,rhonchi or crepitation. No use of accessory muscles of respiration.  CARDIOVASCULAR: S1, S2 normal. No murmurs, rubs, or gallops.  ABDOMEN: Soft, nontender, nondistended. Bowel sounds present. No organomegaly or mass.  EXTREMITIES: No pedal edema, cyanosis, or clubbing.  NEUROLOGIC: Cranial nerves II through XII are intact. Muscle strength 5/5 in all extremities. Sensation intact. Gait not checked.  PSYCHIATRIC: The patient is alert and oriented x 3, anxious appearance.   SKIN: No obvious rash, lesion, or ulcer.   LABORATORY PANEL:   CBC  Recent Labs Lab 01/05/16 1735 01/08/16 1158  WBC 9.4 9.7  HGB 14.0 15.1  HCT 39.6 44.1  PLT 272 271  MCV 86.1 87.9  MCH 30.4 30.1  MCHC 35.3 34.2  RDW 12.9 13.0   ------------------------------------------------------------------------------------------------------------------  Chemistries   Recent Labs Lab 01/05/16 1735 01/08/16 1158  NA 128* 131*  K 3.6 4.1  CL 95* 102  CO2 20* 15*  GLUCOSE 562* 498*  BUN 12 14  CREATININE 0.91 0.87  CALCIUM 9.0 9.9  AST 13*  --   ALT 18  --   ALKPHOS 109  --   BILITOT 0.6  --    ------------------------------------------------------------------------------------------------------------------  Cardiac Enzymes  Recent Labs Lab 01/05/16 1735  TROPONINI <0.03   ------------------------------------------------------------------------------------------------------------------  RADIOLOGY: No results found.  EKG: Orders placed or performed during the hospital encounter of 01/08/16  . EKG 12-Lead  . EKG 12-Lead  . ED EKG  . ED EKG  EKG in the emergency room revealed normal sinus rhythm at 99 bpm, normal axis, no acute ST-T changes   IMPRESSION AND PLAN:  Principal Problem:   DKA (diabetic ketoacidoses) (HCC) Active Problems:   Hyponatremia   Dehydration   Urinary tract infection   Upper abdominal  pain   Nausea & vomiting  #1 DKA in patient with diabetes mellitus type 2, new diagnosis, get hemoglobin A1c, initiate her on insulin IV drip, IV fluids, follow blood glucose levels closely . #2. Hyponatremia due to dehydration, continue IV fluids. Follow sodium level in the morning   #3. Dehydration as above. Continue IV fluids, follow urinary output closely #4 urinary tract infection, initiate patient on the Rocephin, get the urinary cultures, as culture, taken on 01/05/2016 revealed multiple bacterial organisms, recollection was suggested #5. Upper abdominal pain, get lipase level, LFTs, initiate patient on Protonix, get stool cultures, if recurrent diarrhea, get CT of abdomen and pelvis if her abdominal pain is relentless #6. Intermittent nausea and vomiting, IV fluids, supportive therapy, patient is nothing by mouth for now #7 tobacco abuse. Counseling, discussed this patient for approximately 4 minutes, nicotine replacement therapy is going to be initiated, patient is agreeable, calculated lifetime tobacco expenditure was more than $42,000 #8. Essential hypertension, poorly controlled, initiate patient lisinopril  All the records are reviewed and case discussed with ED provider. Management plans discussed with the patient, family and they are in agreement.  CODE STATUS: Code Status History    This patient does not have a recorded code status. Please follow your organizational policy for patients in this situation.       TOTAL Critical care TIME TAKING CARE OF THIS PATIENT 50 minutes.    Theodoro Grist  M.D on 01/08/2016 at 4:35 PM  Between 7am to 6pm - Pager - [phone] After 6pm go to [email or web address] - password [email or web address] [facility]  [facility]  Office  [phone]  CC: Primary care physician; No PCP Per Patient

## 2016-01-08 NOTE — Progress Notes (Signed)
Anticoagulation monitoring(Lovenox):  55 yo  ordered Lovenox 40 mg Q24h  Filed Weights   01/08/16 1150  Weight: 229 lb (103.874 kg)   BMI 40.3 Lab Results  Component Value Date   CREATININE 0.87 01/08/2016   CREATININE 0.91 01/05/2016   CREATININE 1.12* 07/07/2012   Estimated Creatinine Clearance: 85.2 mL/min (by C-G formula based on Cr of 0.87). Hemoglobin & Hematocrit     Component Value Date/Time   HGB 15.1 01/08/2016 1158   HCT 44.1 01/08/2016 1158     Per Protocol for Patient with estCrcl< 30 ml/min and BMI < 40, will transition to Lovenox 40 mg Q12h.

## 2016-01-09 LAB — URINALYSIS COMPLETE WITH MICROSCOPIC (ARMC ONLY)
BILIRUBIN URINE: NEGATIVE
NITRITE: POSITIVE — AB
Protein, ur: NEGATIVE mg/dL
Specific Gravity, Urine: 1.029 (ref 1.005–1.030)
pH: 5 (ref 5.0–8.0)

## 2016-01-09 LAB — BASIC METABOLIC PANEL
ANION GAP: 8 (ref 5–15)
ANION GAP: 8 (ref 5–15)
Anion gap: 8 (ref 5–15)
BUN: 9 mg/dL (ref 6–20)
BUN: 8 mg/dL (ref 6–20)
BUN: 8 mg/dL (ref 6–20)
CALCIUM: 8.5 mg/dL — AB (ref 8.9–10.3)
CALCIUM: 8.5 mg/dL — AB (ref 8.9–10.3)
CHLORIDE: 109 mmol/L (ref 101–111)
CHLORIDE: 109 mmol/L (ref 101–111)
CHLORIDE: 108 mmol/L (ref 101–111)
CO2: 19 mmol/L — AB (ref 22–32)
CO2: 18 mmol/L — AB (ref 22–32)
CO2: 19 mmol/L — AB (ref 22–32)
CREATININE: 0.68 mg/dL (ref 0.44–1.00)
Calcium: 8.5 mg/dL — ABNORMAL LOW (ref 8.9–10.3)
Creatinine, Ser: 0.7 mg/dL (ref 0.44–1.00)
Creatinine, Ser: 0.56 mg/dL (ref 0.44–1.00)
GFR calc non Af Amer: >60 mL/min (ref >60)
GFR calc non Af Amer: >60 mL/min (ref >60)
GFR calc non Af Amer: >60 mL/min (ref >60)
GLUCOSE: 166 mg/dL — AB (ref 65–99)
GLUCOSE: 211 mg/dL — AB (ref 65–99)
GLUCOSE: 131 mg/dL — AB (ref 65–99)
Potassium: 3.2 mmol/L — ABNORMAL LOW (ref 3.5–5.1)
Potassium: 3.2 mmol/L — ABNORMAL LOW (ref 3.5–5.1)
Potassium: 3 mmol/L — ABNORMAL LOW (ref 3.5–5.1)
Sodium: 136 mmol/L (ref 135–145)
Sodium: 135 mmol/L (ref 135–145)
Sodium: 135 mmol/L (ref 135–145)

## 2016-01-09 LAB — CBC
HEMATOCRIT: 39.1 % (ref 35.0–47.0)
HEMOGLOBIN: 13.7 g/dL (ref 12.0–16.0)
MCH: 30.1 pg (ref 26.0–34.0)
MCHC: 34.9 g/dL (ref 32.0–36.0)
MCV: 86.2 fL (ref 80.0–100.0)
Platelets: 225 10*3/uL (ref 150–440)
RBC: 4.54 MIL/uL (ref 3.80–5.20)
RDW: 13.1 % (ref 11.5–14.5)
WBC: 5.5 10*3/uL (ref 3.6–11.0)

## 2016-01-09 LAB — GLUCOSE, CAPILLARY
GLUCOSE-CAPILLARY: 179 mg/dL — AB (ref 65–99)
GLUCOSE-CAPILLARY: 212 mg/dL — AB (ref 65–99)
GLUCOSE-CAPILLARY: 155 mg/dL — AB (ref 65–99)
GLUCOSE-CAPILLARY: 127 mg/dL — AB (ref 65–99)
GLUCOSE-CAPILLARY: 113 mg/dL — AB (ref 65–99)
GLUCOSE-CAPILLARY: 185 mg/dL — AB (ref 65–99)
Glucose-Capillary: 119 mg/dL — ABNORMAL HIGH (ref 65–99)
Glucose-Capillary: 158 mg/dL — ABNORMAL HIGH (ref 65–99)
Glucose-Capillary: 172 mg/dL — ABNORMAL HIGH (ref 65–99)
Glucose-Capillary: 189 mg/dL — ABNORMAL HIGH (ref 65–99)

## 2016-01-09 LAB — TROPONIN I
Troponin I: <0.03 ng/mL (ref <0.03)
Troponin I: <0.03 ng/mL (ref <0.03)

## 2016-01-09 LAB — HEMOGLOBIN A1C: Hgb A1c MFr Bld: 10.7 % — ABNORMAL HIGH (ref 4.0–6.0)

## 2016-01-09 MED ORDER — CEPHALEXIN 500 MG PO CAPS
500.0000 mg | ORAL_CAPSULE | Freq: Two times a day (BID) | ORAL | Status: DC
  Administered 2016-01-09: 500 mg via ORAL
  Filled 2016-01-09: qty 1

## 2016-01-09 MED ORDER — METFORMIN HCL 500 MG PO TABS
500.0000 mg | ORAL_TABLET | Freq: Two times a day (BID) | ORAL | Status: DC
  Administered 2016-01-09: 500 mg via ORAL
  Filled 2016-01-09: qty 1

## 2016-01-09 MED ORDER — POTASSIUM CHLORIDE CRYS ER 20 MEQ PO TBCR
40.0000 meq | EXTENDED_RELEASE_TABLET | Freq: Once | ORAL | Status: AC
Start: 2016-01-09 — End: 2016-01-09
  Administered 2016-01-09: 40 meq via ORAL
  Filled 2016-01-09: qty 2

## 2016-01-09 MED ORDER — METFORMIN HCL 500 MG PO TABS: 500.0000 mg | ORAL_TABLET | Freq: Two times a day (BID) | ORAL | Status: DC

## 2016-01-09 MED ORDER — LISINOPRIL 5 MG PO TABS: 5.0000 mg | ORAL_TABLET | Freq: Every day | ORAL | Status: DC

## 2016-01-09 MED ORDER — LIVING WELL WITH DIABETES BOOK
Freq: Once | Status: AC
  Administered 2016-01-09: 12:00:00
  Filled 2016-01-09: qty 1

## 2016-01-09 MED ORDER — CEPHALEXIN 500 MG PO CAPS: 500.0000 mg | ORAL_CAPSULE | Freq: Two times a day (BID) | ORAL | Status: DC

## 2016-01-09 NOTE — Progress Notes (Signed)
Discharge instructions and new prescriptions were given to and reviewed with patient.  Patient verbalized understanding of all discharge instructions including prescriptions, diet and [facility]. Patient walked down to visitors entrance with this RN and left [facility] by car with her boyfriend.  No distress noted.

## 2016-01-09 NOTE — Care Management (Signed)
Received call from primary RN that patient needed assistance with [facility]. RNCM came to see patient and she had already left. It is stated in the DC summary that [facility]. Primary nurse reports patient had one but didn't show up for it. Discharge medications are on the $4 [facility] list.

## 2016-01-09 NOTE — Discharge Summary (Signed)
[place] [facility] [place] NAME: Pamela Lane    MR#:  [id number]  DATE OF BIRTH:  21-Nov-1960  DATE OF ADMISSION:  01/08/2016 ADMITTING PHYSICIAN: Theodoro Grist, MD  DATE OF DISCHARGE: 01/09/16  PRIMARY CARE PHYSICIAN: No PCP Per Patient    ADMISSION DIAGNOSIS:  Diabetic ketoacidosis without coma associated with type 2 diabetes mellitus (HCC) [E13.10]  DISCHARGE DIAGNOSIS:   Uncontrolled type 2 diabetes new onset Acute renal failure improved Hypokalemia History of bipolar disorder((from old records) Tobacco abuse SECONDARY DIAGNOSIS:   Past Medical History  Diagnosis Date  . Mental disorder     pt reports 'I have all of them mental disorders'  . Hypertension     HOSPITAL COURSE:  Nubia Ziesmer is a 55 y.o. female with a known history of Essential hypertension, mental disorder of unclear entity, recent diagnosis of diabetes mellitus about 3 days ago, also urinary tract infection, for which she was discharged from the emergency room . He days ago with nitrofurantoin and metformin comes back to emergency room was complains of blurry vision, increased frequency of urination, dehydration, intermittent nausea and vomiting, upper abdominal pain  #1 Uncontrolled diabetes mellitus type 2, new diagnosis, -Patient apparently had been noncompliant with her metformin that was prescribed to the emergency room. -She was on insulin IV drip by admitting physician., IV fluids, follow blood glucose levels closely -She was well under control.  -Lab results do not coincide with DKA. This appears to be uncontrolled type 2 diabetes. -Patient educated regarding resuming metformin and being compliant with her diet and check her sugars with the glucometer she has it keep log of it  . #2. Hyponatremia due to dehydration, continue IV fluids. -Improved  #3. Dehydration as above. Received IV fluids  #4 urinary tract infection -Keflex 500 twice a  day  #5 tobacco abuse. Counseling, discussed this patient for approximately 4 minutes, nicotine replacement therapy   # elevated BP w/o diagnosis of BP Start low dose lisinopril   Overall improved D/c home CM to see pt prior to d/c [facility] to her CONSULTS OBTAINED:     DRUG ALLERGIES:  No Known Allergies  DISCHARGE MEDICATIONS:   Current Discharge Medication List    START taking these medications   Details  cephALEXin (KEFLEX) 500 MG capsule Take 1 capsule (500 mg total) by mouth every 12 (twelve) hours. Qty: 10 capsule, Refills: 0    lisinopril (PRINIVIL,ZESTRIL) 5 MG tablet Take 1 tablet (5 mg total) by mouth daily. Qty: 30 tablet, Refills: 1      CONTINUE these medications which have CHANGED   Details  metFORMIN (GLUCOPHAGE) 500 MG tablet Take 1 tablet (500 mg total) by mouth 2 (two) times daily with a meal. Qty: 60 tablet, Refills: 2      CONTINUE these medications which have NOT CHANGED   Details  hydrOXYzine (ATARAX/VISTARIL) 25 MG tablet Take 1 tablet (25 mg total) by mouth 3 (three) times daily as needed for itching. Qty: 30 tablet, Refills: 0    blood glucose meter kit and supplies KIT Dispense based on patient and insurance preference. Use up to four times daily as directed. (FOR ICD-9 250.00, 250.01). Qty: 1 each, Refills: 0      STOP taking these medications     nitrofurantoin, macrocrystal-monohydrate, (MACROBID) 100 MG capsule         If you experience worsening of your admission symptoms, develop shortness of breath, life  threatening emergency, suicidal or homicidal thoughts you must seek medical attention immediately by calling 911 or calling your MD immediately  if symptoms less severe.  You Must read complete instructions/literature along with all the possible adverse reactions/side effects for all the Medicines you take and that have been prescribed to you. Take any new Medicines after you have completely  understood and accept all the possible adverse reactions/side effects.   Please note  You were cared for by a hospitalist during your hospital stay. If you have any questions about your discharge medications or the care you received while you were in the hospital after you are discharged, you can call the unit and asked to speak with the hospitalist on call if the hospitalist that took care of you is not available. Once you are discharged, your primary care physician will handle any further medical issues. Please note that NO REFILLS for any discharge medications will be authorized once you are discharged, as it is imperative that you return to your primary care physician (or establish a relationship with a primary care physician if you do not have one) for your aftercare needs so that they can reassess your need for medications and monitor your lab values. Today   SUBJECTIVE   i am hungry and i want to go home.  VITAL SIGNS:  Blood pressure 130/76, pulse 99, temperature 98.3 F (36.8 C), temperature source Oral, resp. rate 15, height '5\' 3"'  (1.6 m), weight 103.874 kg (229 lb), SpO2 97 %.  I/O:    Intake/Output Summary (Last 24 hours) at 01/09/16 1145 Last data filed at 01/09/16 1117  Gross per 24 hour  Intake 1127.52 ml  Output    230 ml  Net 897.52 ml    PHYSICAL EXAMINATION:  GENERAL:  55 y.o.-year-old patient lying in the bed with no acute distress.  EYES: Pupils equal, round, reactive to light and accommodation. No scleral icterus. Extraocular muscles intact.  HEENT: Head atraumatic, normocephalic. Oropharynx and nasopharynx clear.  NECK:  Supple, no jugular venous distention. No thyroid enlargement, no tenderness.  LUNGS: Normal breath sounds bilaterally, no wheezing, rales,rhonchi or crepitation. No use of accessory muscles of respiration.  CARDIOVASCULAR: S1, S2 normal. No murmurs, rubs, or gallops.  ABDOMEN: Soft, non-tender, non-distended. Bowel sounds present. No  organomegaly or mass.  EXTREMITIES: No pedal edema, cyanosis, or clubbing.  NEUROLOGIC: Cranial nerves II through XII are intact. Muscle strength 5/5 in all extremities. Sensation intact. Gait not checked.  PSYCHIATRIC: The patient is alert and oriented x 3.  SKIN: No obvious rash, lesion, or ulcer.   DATA REVIEW:   CBC   Recent Labs Lab 01/09/16 0753  WBC 5.5  HGB 13.7  HCT 39.1  PLT 225    Chemistries   Recent Labs Lab 01/08/16 1158  01/09/16 0753  NA 131*  < > 135  K 4.1  < > 3.0*  CL 102  < > 108  CO2 15*  < > 19*  GLUCOSE 498*  < > 131*  BUN 14  < > 8  CREATININE 0.87  < > 0.56  CALCIUM 9.9  < > 8.5*  AST 12*  --   --   ALT 16  --   --   ALKPHOS 109  --   --   BILITOT 0.4  --   --   < > = values in this interval not displayed.  Microbiology Results   Recent Results (from the past 240 hour(s))  Urine culture  Status: Abnormal   Collection Time: 01/05/16  8:02 PM  Result Value Ref Range Status   Specimen Description URINE, CLEAN CATCH  Final   Special Requests NONE  Final   Culture MULTIPLE SPECIES PRESENT, SUGGEST RECOLLECTION (A)  Final   Report Status 01/07/2016 FINAL  Final  MRSA PCR Screening     Status: None   Collection Time: 01/08/16  7:05 PM  Result Value Ref Range Status   MRSA by PCR NEGATIVE NEGATIVE Final    RADIOLOGY:  No results found.   Management plans discussed with the patient, family and they are in agreement.  CODE STATUS:     Code Status Orders        Start     Ordered   01/08/16 1917  Full code   Continuous     01/08/16 1916    Code Status History    Date Active Date Inactive Code Status Order ID Comments User Context   This patient has a current code status but no historical code status.      TOTAL TIME TAKING CARE OF THIS PATIENT:40* minutes.    Maheen Cwikla M.D on 01/09/2016 at 11:45 AM  Between 7am to 6pm - Pager - [phone] After 6pm go to [email or web address] - password [email or web address] [facility]  [place]  [facility]  Office  [phone]  CC: Primary care physician; No PCP Per Patient

## 2016-01-09 NOTE — Progress Notes (Addendum)
Inpatient Diabetes Program Recommendations  AACE/ADA: New Consensus Statement on Inpatient Glycemic Control (2015)  Target Ranges:  Prepandial:   less than 140 mg/dL      Peak postprandial:   less than 180 mg/dL (1-2 hours)      Critically ill patients:  140 - 180 mg/dL   Lab Results  Component Value Date   GLUCAP 155* 01/09/2016    Review of Glycemic Control  Results for Laurey MoraleSPRINGFIELD, Tenecia G (MRN [id number]) as of 01/09/2016 07:48  Ref. Range 01/08/2016 22:36 01/08/2016 23:46 01/09/2016 00:36 01/09/2016 01:49 01/09/2016 02:50 01/09/2016 04:28 01/09/2016 05:49 01/09/2016 06:51  Glucose-Capillary Latest Ref Range: 65-99 mg/dL 409159 (H) 811175 (H) 914172 (H) 158 (H) 179 (H) 212 (H) 189 (H) 155 (H)    Diabetes history: New onset x 1 week Outpatient Diabetes medications: Metformin 500mg /day- never filled it when she was diagnosed Current orders for Inpatient glycemic control: IV insulin via Glucostabilizer  Inpatient Diabetes Program Recommendations:  Patient can be transitioned to Phase 2 of the DKA order set when MD determines (venous CO2=20, anion gap 8-12, negative ketones)  When the patient is ready to transition to phase 2,  RN discontinue insulin drip 2 hours after subcutaneous basal insulin given and simultaneously give Novolog correction scale.  9:35am  Patient appears ready to come off insulin drip.  Please order basal (Lantus), Novolog correction and Novolog mealtime insulin as directed in the DKA Phase 2 order set.  Susette RacerJulie Koah Chisenhall, RN, BA, MHA, CDE Diabetes Coordinator Inpatient Diabetes Program  [phone] (Team Pager) [phone] [facility] Office) 01/09/2016 8:00 AM

## 2016-01-09 NOTE — Progress Notes (Signed)
[facility] for patient and was instructed by their staff for patient to call back next week to set up new appointment because they are not making appointments for new patients until next week.

## 2016-01-09 NOTE — Discharge Instructions (Signed)
Glucometer- keep log of your sugars

## 2016-01-10 LAB — URINE CULTURE

## 2016-01-12 ENCOUNTER — Emergency Department (HOSPITAL_COMMUNITY): Payer: Self-pay

## 2016-01-12 ENCOUNTER — Inpatient Hospital Stay (HOSPITAL_COMMUNITY)
Admission: EM | Admit: 2016-01-12 | Discharge: 2016-01-14 | DRG: 638 | Disposition: A | Discharge status: Home / Self Care | Payer: Self-pay | Attending: Internal Medicine | Admitting: Internal Medicine

## 2016-01-12 ENCOUNTER — Encounter (HOSPITAL_COMMUNITY): Payer: Self-pay | Admitting: *Deleted

## 2016-01-12 DIAGNOSIS — R1010 Upper abdominal pain, unspecified: Secondary | ICD-10-CM

## 2016-01-12 DIAGNOSIS — F1290 Cannabis use, unspecified, uncomplicated: Secondary | ICD-10-CM | POA: Diagnosis present

## 2016-01-12 DIAGNOSIS — E860 Dehydration: Secondary | ICD-10-CM | POA: Diagnosis present

## 2016-01-12 DIAGNOSIS — X58XXXA Exposure to other specified factors, initial encounter: Secondary | ICD-10-CM | POA: Diagnosis present

## 2016-01-12 DIAGNOSIS — Z79899 Other long term (current) drug therapy: Secondary | ICD-10-CM

## 2016-01-12 DIAGNOSIS — N390 Urinary tract infection, site not specified: Secondary | ICD-10-CM | POA: Diagnosis present

## 2016-01-12 DIAGNOSIS — E1165 Type 2 diabetes mellitus with hyperglycemia: Secondary | ICD-10-CM | POA: Diagnosis present

## 2016-01-12 DIAGNOSIS — N3001 Acute cystitis with hematuria: Secondary | ICD-10-CM

## 2016-01-12 DIAGNOSIS — E876 Hypokalemia: Secondary | ICD-10-CM | POA: Diagnosis present

## 2016-01-12 DIAGNOSIS — Z23 Encounter for immunization: Secondary | ICD-10-CM

## 2016-01-12 DIAGNOSIS — E1110 Type 2 diabetes mellitus with ketoacidosis without coma: Secondary | ICD-10-CM | POA: Insufficient documentation

## 2016-01-12 DIAGNOSIS — I10 Essential (primary) hypertension: Secondary | ICD-10-CM | POA: Diagnosis present

## 2016-01-12 DIAGNOSIS — Z7984 Long term (current) use of oral hypoglycemic drugs: Secondary | ICD-10-CM

## 2016-01-12 DIAGNOSIS — E871 Hypo-osmolality and hyponatremia: Secondary | ICD-10-CM | POA: Diagnosis present

## 2016-01-12 DIAGNOSIS — Z794 Long term (current) use of insulin: Secondary | ICD-10-CM

## 2016-01-12 DIAGNOSIS — T730XXA Starvation, initial encounter: Secondary | ICD-10-CM | POA: Diagnosis present

## 2016-01-12 DIAGNOSIS — E1310 Other specified diabetes mellitus with ketoacidosis without coma: Principal | ICD-10-CM | POA: Diagnosis present

## 2016-01-12 DIAGNOSIS — R112 Nausea with vomiting, unspecified: Secondary | ICD-10-CM

## 2016-01-12 DIAGNOSIS — N179 Acute kidney failure, unspecified: Secondary | ICD-10-CM | POA: Diagnosis present

## 2016-01-12 HISTORY — DX: Type 2 diabetes mellitus without complications: E11.9

## 2016-01-12 LAB — URINALYSIS, ROUTINE W REFLEX MICROSCOPIC
Glucose, UA: >1000 mg/dL — AB
Ketones, ur: 40 mg/dL — AB
NITRITE: NEGATIVE
PH: 5.5 (ref 5.0–8.0)
Protein, ur: 30 mg/dL — AB
SPECIFIC GRAVITY, URINE: 1.028 (ref 1.005–1.030)

## 2016-01-12 LAB — COMPREHENSIVE METABOLIC PANEL
ALT: 23 U/L (ref 14–54)
ANION GAP: 17 — AB (ref 5–15)
AST: 18 U/L (ref 15–41)
Albumin: 4.2 g/dL (ref 3.5–5.0)
Alkaline Phosphatase: 97 U/L (ref 38–126)
BILIRUBIN TOTAL: 1 mg/dL (ref 0.3–1.2)
BUN: 19 mg/dL (ref 6–20)
CHLORIDE: 90 mmol/L — AB (ref 101–111)
CO2: 17 mmol/L — ABNORMAL LOW (ref 22–32)
Calcium: 10.2 mg/dL (ref 8.9–10.3)
Creatinine, Ser: 1.73 mg/dL — ABNORMAL HIGH (ref 0.44–1.00)
GFR calc Af Amer: 37 mL/min — ABNORMAL LOW (ref >60)
GFR, EST NON AFRICAN AMERICAN: 32 mL/min — AB (ref >60)
GLUCOSE: 503 mg/dL — AB (ref 65–99)
POTASSIUM: 4.1 mmol/L (ref 3.5–5.1)
SODIUM: 124 mmol/L — AB (ref 135–145)
TOTAL PROTEIN: 8.2 g/dL — AB (ref 6.5–8.1)

## 2016-01-12 LAB — CBG MONITORING, ED
Glucose-Capillary: 465 mg/dL — ABNORMAL HIGH (ref 65–99)
Glucose-Capillary: 484 mg/dL — ABNORMAL HIGH (ref 65–99)
Glucose-Capillary: 519 mg/dL (ref 65–99)

## 2016-01-12 LAB — I-STAT VENOUS BLOOD GAS, ED
Acid-base deficit: 10 mmol/L — ABNORMAL HIGH (ref 0.0–2.0)
BICARBONATE: 14.5 meq/L — AB (ref 20.0–24.0)
O2 Saturation: 63 %
PCO2 VEN: 28.3 mmHg — AB (ref 45.0–50.0)
PH VEN: 7.318 — AB (ref 7.250–7.300)
PO2 VEN: 35 mmHg (ref 31.0–45.0)
TCO2: 15 mmol/L (ref 0–100)

## 2016-01-12 LAB — URINE MICROSCOPIC-ADD ON

## 2016-01-12 LAB — GLUCOSE, CAPILLARY: Glucose-Capillary: 281 mg/dL — ABNORMAL HIGH (ref 65–99)

## 2016-01-12 LAB — CBC
HEMATOCRIT: 42.6 % (ref 36.0–46.0)
HEMOGLOBIN: 15.1 g/dL — AB (ref 12.0–15.0)
MCH: 30.2 pg (ref 26.0–34.0)
MCHC: 35.4 g/dL (ref 30.0–36.0)
MCV: 85.2 fL (ref 78.0–100.0)
Platelets: 318 10*3/uL (ref 150–400)
RBC: 5 MIL/uL (ref 3.87–5.11)
RDW: 12.7 % (ref 11.5–15.5)
WBC: 13.1 10*3/uL — ABNORMAL HIGH (ref 4.0–10.5)

## 2016-01-12 MED ORDER — SODIUM CHLORIDE 0.9 % IV BOLUS (SEPSIS)
1000.0000 mL | Freq: Once | INTRAVENOUS | Status: AC
  Administered 2016-01-12: 1000 mL via INTRAVENOUS

## 2016-01-12 MED ORDER — ONDANSETRON HCL 4 MG/2ML IJ SOLN
4.0000 mg | Freq: Once | INTRAMUSCULAR | Status: AC
  Administered 2016-01-12: 4 mg via INTRAVENOUS
  Filled 2016-01-12: qty 2

## 2016-01-12 MED ORDER — SODIUM CHLORIDE 0.9 % IV SOLN
INTRAVENOUS | Status: DC
  Administered 2016-01-12: 4.1 [IU]/h via INTRAVENOUS
  Filled 2016-01-12: qty 2.5

## 2016-01-12 MED ORDER — DEXTROSE 5 % IV SOLN
1.0000 g | Freq: Once | INTRAVENOUS | Status: AC
  Administered 2016-01-12: 1 g via INTRAVENOUS
  Filled 2016-01-12: qty 10

## 2016-01-12 MED ORDER — POTASSIUM CHLORIDE IN NACL 20-0.9 MEQ/L-% IV SOLN
INTRAVENOUS | Status: DC
  Administered 2016-01-13: 01:00:00 via INTRAVENOUS
  Filled 2016-01-12 (×2): qty 1000

## 2016-01-12 MED ORDER — DEXTROSE-NACL 5-0.45 % IV SOLN: INTRAVENOUS | Status: DC

## 2016-01-12 MED ORDER — SODIUM CHLORIDE 0.9 % IV SOLN
INTRAVENOUS | Status: DC
  Administered 2016-01-12: 23:00:00 via INTRAVENOUS

## 2016-01-12 NOTE — ED Notes (Signed)
Patient presents stating her sugar is up. Find 6/26 that she was diabetic.  Was in the hospital for the same.   CBG 484

## 2016-01-12 NOTE — ED Notes (Signed)
Portable in room.  

## 2016-01-12 NOTE — ED Provider Notes (Signed)
CSN: [id number]     Arrival date & time 01/12/16  1933 History   First MD Initiated Contact with Patient 01/12/16 2209     Chief Complaint  Patient presents with  . Hyperglycemia     (Consider location/radiation/quality/duration/timing/severity/associated sxs/prior Treatment) Patient is a 55 y.o. female presenting with hyperglycemia. The history is provided by the patient.  Hyperglycemia Blood sugar level PTA:  450 Severity:  Moderate Onset quality:  Sudden Timing:  Constant Progression:  Worsening Chronicity:  Recurrent Diabetes status:  Controlled with oral medications Context: new diabetes diagnosis   Context: not change in medication and not noncompliance   Relieved by:  Nothing Associated symptoms: abdominal pain, blurred vision, dehydration, dizziness, fatigue, increased thirst, malaise, polyuria, shortness of breath, vomiting and weakness   Associated symptoms: no chest pain, no confusion, no dysuria, no fever and no nausea     Past Medical History  Diagnosis Date  . Mental disorder     pt reports 'I have all of them mental disorders'  . Hypertension   . Diabetes mellitus without complication [facility])    Past Surgical History  Procedure Laterality Date  . Abdominal hysterectomy     No family history on file. Social History  Substance Use Topics  . Smoking status: Current Every Day Smoker -- 1.00 packs/day    Types: Cigarettes  . Smokeless tobacco: None  . Alcohol Use: No     Comment: every once in a blue moon   OB History    No data available     Review of Systems  Constitutional: Positive for fatigue. Negative for fever and chills.  HENT: Negative for congestion and sore throat.   Eyes: Positive for blurred vision. Negative for pain.  Respiratory: Positive for shortness of breath. Negative for cough.   Cardiovascular: Negative for chest pain and palpitations.  Gastrointestinal: Positive for vomiting and abdominal pain. Negative for nausea and diarrhea.   Endocrine: Positive for polydipsia and polyuria.  Genitourinary: Negative for dysuria and flank pain.  Musculoskeletal: Negative for back pain and neck pain.  Skin: Negative for rash.  Allergic/Immunologic: Negative.   Neurological: Positive for dizziness and weakness. Negative for light-headedness.  Psychiatric/Behavioral: Negative for confusion.   Allergies  Review of patient's allergies indicates no known allergies.  Home Medications   Prior to Admission medications   Medication Sig Start Date End Date Taking? Authorizing Provider  cephALEXin (KEFLEX) 500 MG capsule Take 1 capsule (500 mg total) by mouth every 12 (twelve) hours. Patient taking differently: Take 500 mg by mouth every 12 (twelve) hours. 5 day course filled 01/09/16 01/09/16  Yes Fritzi Mandes, MD  lisinopril (PRINIVIL,ZESTRIL) 5 MG tablet Take 1 tablet (5 mg total) by mouth daily. 01/09/16  Yes Fritzi Mandes, MD  metFORMIN (GLUCOPHAGE) 500 MG tablet Take 1 tablet (500 mg total) by mouth 2 (two) times daily with a meal. 01/09/16  Yes Fritzi Mandes, MD  blood glucose meter kit and supplies KIT Dispense based on patient and insurance preference. Use up to four times daily as directed. (FOR ICD-9 250.00, 250.01). 01/05/16   Joanne Gavel, MD  hydrOXYzine (ATARAX/VISTARIL) 25 MG tablet Take 1 tablet (25 mg total) by mouth 3 (three) times daily as needed for itching. Patient not taking: Reported on 01/12/2016 10/07/15   Hinda Kehr, MD   BP 140/73 mmHg  Pulse 98  Temp(Src) 97.5 F (36.4 C) (Oral)  Resp 23  Ht _0  (1.6 m)  Wt 93.078 kg  BMI 36.36 kg/m2  SpO2  100% Physical Exam  Constitutional: She is oriented to person, place, and time. She appears well-developed and well-nourished. No distress.  HENT:  Head: Normocephalic and atraumatic.  Eyes: Conjunctivae and EOM are normal. Pupils are equal, round, and reactive to light.  Neck: Normal range of motion. Neck supple.  Cardiovascular: Regular rhythm and normal heart sounds.   Tachycardia present.   Pulmonary/Chest: Effort normal and breath sounds normal. Tachypnea noted. No respiratory distress.  Abdominal: Soft. Bowel sounds are normal. There is tenderness (diffuse). There is no rigidity, no rebound, no guarding, no tenderness at McBurney's point and negative Murphy's sign.  Musculoskeletal: Normal range of motion.  Neurological: She is alert and oriented to person, place, and time. She has normal reflexes. No cranial nerve deficit.  Skin: Skin is warm and dry. She is not diaphoretic.  Psychiatric: She has a normal mood and affect.    ED Course  Procedures (including critical care time) Labs Review Labs Reviewed  CBC - Abnormal; Notable for the following:    WBC 13.1 (*)    Hemoglobin 15.1 (*)    All other components within normal limits  URINALYSIS, ROUTINE W REFLEX MICROSCOPIC (NOT AT [facility]) - Abnormal; Notable for the following:    APPearance CLOUDY (*)    Glucose, UA >1000 (*)    Hgb urine dipstick SMALL (*)    Bilirubin Urine MODERATE (*)    Ketones, ur 40 (*)    Protein, ur 30 (*)    Leukocytes, UA MODERATE (*)    All other components within normal limits  COMPREHENSIVE METABOLIC PANEL - Abnormal; Notable for the following:    Sodium 124 (*)    Chloride 90 (*)    CO2 17 (*)    Glucose, Bld 503 (*)    Creatinine, Ser 1.73 (*)    Total Protein 8.2 (*)    GFR calc non Af Amer 32 (*)    GFR calc Af Amer 37 (*)    Anion gap 17 (*)    All other components within normal limits  URINE MICROSCOPIC-ADD ON - Abnormal; Notable for the following:    Squamous Epithelial / LPF 6-30 (*)    Bacteria, UA MANY (*)    Casts HYALINE CASTS (*)    All other components within normal limits  CBG MONITORING, ED - Abnormal; Notable for the following:    Glucose-Capillary 484 (*)    All other components within normal limits  CBG MONITORING, ED - Abnormal; Notable for the following:    Glucose-Capillary 519 (*)    All other components within normal limits  I-STAT  VENOUS BLOOD GAS, ED - Abnormal; Notable for the following:    pH, Ven 7.318 (*)    pCO2, Ven 28.3 (*)    Bicarbonate 14.5 (*)    Acid-base deficit 10.0 (*)    All other components within normal limits  CBG MONITORING, ED - Abnormal; Notable for the following:    Glucose-Capillary 465 (*)    All other components within normal limits  CULTURE, BLOOD (ROUTINE X 2)  CULTURE, BLOOD (ROUTINE X 2)  URINE CULTURE  BLOOD GAS, VENOUS  LACTIC ACID, PLASMA  LACTIC ACID, PLASMA  CBG MONITORING, ED    Imaging Review Dg Chest Portable 1 View  01/12/2016  CLINICAL DATA:  Cough and shortness of breath EXAM: PORTABLE CHEST 1 VIEW COMPARISON:  None. FINDINGS: The heart size and mediastinal contours are within normal limits. Both lungs are clear. The visualized skeletal structures are unremarkable. IMPRESSION: No  active disease. Electronically Signed   By: Nelson Chimes M.D.   On: 01/12/2016 23:03   I have personally reviewed and evaluated these images and lab results as part of my medical decision-making.   EKG Interpretation None      MDM   Final diagnoses:  Diabetic ketoacidosis without coma associated with type 2 diabetes mellitus (HCC)  Hyponatremia  Dehydration  Acute cystitis with hematuria  Nausea and vomiting, vomiting of unspecified type  AKI (acute kidney injury) ([place])    The pt is a 55 yo female with a hx of new onset DM presenting for n/v, abdominal pain, and hyperglycemia.  Reports BG has been controlled but spike to 400s earlier today.   On exam pt is tachypneic and tachycardic but mentating normally. CBC with luekocytosis.  BMP with hyponatremia, hypochloremia, with hyperglycemia. Elevated AG and decreased bicarb.  VBG with pH of 7.31.  Likely DKA.  Recent UTI with possible UTI now but contaminated.  Ordered blood cultures, urine cultures and repeat UA.  Given ceftriaxone for coverage of likely UTI. Diffuse abdominal pain without focality or signs of peritonitis.  Likely  associated with DKA and doubt surgical abdomen or pyelo. Cr elevated and given fluids in the ED.   Discussed with hospitalist who agrees to admission.   Labs were viewed by myself and incorporated into medical decision making.  Discussed pertinent finding with patient or caregiver prior to admission with no further questions.  Pt care supervised by my attending Dr. Junius Roads, MD PGY-3 Emergency Medicine     Geronimo Boot, MD 01/13/16 4652  Dorie Rank, MD 01/13/16 [phone]

## 2016-01-13 DIAGNOSIS — E871 Hypo-osmolality and hyponatremia: Secondary | ICD-10-CM

## 2016-01-13 DIAGNOSIS — R1010 Upper abdominal pain, unspecified: Secondary | ICD-10-CM

## 2016-01-13 DIAGNOSIS — R112 Nausea with vomiting, unspecified: Secondary | ICD-10-CM

## 2016-01-13 DIAGNOSIS — N390 Urinary tract infection, site not specified: Secondary | ICD-10-CM | POA: Diagnosis present

## 2016-01-13 DIAGNOSIS — E1165 Type 2 diabetes mellitus with hyperglycemia: Secondary | ICD-10-CM | POA: Diagnosis present

## 2016-01-13 LAB — CBC
HCT: 39.7 % (ref 36.0–46.0)
HEMOGLOBIN: 13.8 g/dL (ref 12.0–15.0)
MCH: 29.3 pg (ref 26.0–34.0)
MCHC: 34.8 g/dL (ref 30.0–36.0)
MCV: 84.3 fL (ref 78.0–100.0)
Platelets: 260 10*3/uL (ref 150–400)
RBC: 4.71 MIL/uL (ref 3.87–5.11)
RDW: 12.5 % (ref 11.5–15.5)
WBC: 10 10*3/uL (ref 4.0–10.5)

## 2016-01-13 LAB — BASIC METABOLIC PANEL
ANION GAP: 13 (ref 5–15)
ANION GAP: 12 (ref 5–15)
ANION GAP: 9 (ref 5–15)
BUN: 17 mg/dL (ref 6–20)
BUN: 15 mg/dL (ref 6–20)
BUN: 14 mg/dL (ref 6–20)
CALCIUM: 9.3 mg/dL (ref 8.9–10.3)
CALCIUM: 9 mg/dL (ref 8.9–10.3)
CHLORIDE: 101 mmol/L (ref 101–111)
CO2: 18 mmol/L — AB (ref 22–32)
CO2: 20 mmol/L — AB (ref 22–32)
CO2: 20 mmol/L — ABNORMAL LOW (ref 22–32)
CREATININE: 1.01 mg/dL — AB (ref 0.44–1.00)
Calcium: 9.5 mg/dL (ref 8.9–10.3)
Chloride: 99 mmol/L — ABNORMAL LOW (ref 101–111)
Chloride: 102 mmol/L (ref 101–111)
Creatinine, Ser: 1.2 mg/dL — ABNORMAL HIGH (ref 0.44–1.00)
Creatinine, Ser: 1.01 mg/dL — ABNORMAL HIGH (ref 0.44–1.00)
GFR calc non Af Amer: >60 mL/min (ref >60)
GFR, EST AFRICAN AMERICAN: 58 mL/min — AB (ref >60)
GFR, EST NON AFRICAN AMERICAN: 50 mL/min — AB (ref >60)
GLUCOSE: 116 mg/dL — AB (ref 65–99)
GLUCOSE: 268 mg/dL — AB (ref 65–99)
Glucose, Bld: 343 mg/dL — ABNORMAL HIGH (ref 65–99)
Potassium: 3.5 mmol/L (ref 3.5–5.1)
Potassium: 2.9 mmol/L — ABNORMAL LOW (ref 3.5–5.1)
Potassium: 3.7 mmol/L (ref 3.5–5.1)
Sodium: 130 mmol/L — ABNORMAL LOW (ref 135–145)
Sodium: 133 mmol/L — ABNORMAL LOW (ref 135–145)
Sodium: 131 mmol/L — ABNORMAL LOW (ref 135–145)

## 2016-01-13 LAB — GLUCOSE, CAPILLARY
GLUCOSE-CAPILLARY: 350 mg/dL — AB (ref 65–99)
GLUCOSE-CAPILLARY: 230 mg/dL — AB (ref 65–99)
GLUCOSE-CAPILLARY: 193 mg/dL — AB (ref 65–99)
GLUCOSE-CAPILLARY: 182 mg/dL — AB (ref 65–99)
GLUCOSE-CAPILLARY: 149 mg/dL — AB (ref 65–99)
GLUCOSE-CAPILLARY: 118 mg/dL — AB (ref 65–99)
GLUCOSE-CAPILLARY: 126 mg/dL — AB (ref 65–99)
GLUCOSE-CAPILLARY: 233 mg/dL — AB (ref 65–99)
GLUCOSE-CAPILLARY: 162 mg/dL — AB (ref 65–99)
GLUCOSE-CAPILLARY: 271 mg/dL — AB (ref 65–99)
Glucose-Capillary: 334 mg/dL — ABNORMAL HIGH (ref 65–99)
Glucose-Capillary: 270 mg/dL — ABNORMAL HIGH (ref 65–99)

## 2016-01-13 LAB — LACTIC ACID, PLASMA
LACTIC ACID, VENOUS: 1.9 mmol/L (ref 0.5–1.9)
LACTIC ACID, VENOUS: 1 mmol/L (ref 0.5–1.9)

## 2016-01-13 LAB — CBG MONITORING, ED: Glucose-Capillary: 381 mg/dL — ABNORMAL HIGH (ref 65–99)

## 2016-01-13 LAB — MAGNESIUM: Magnesium: 1.6 mg/dL — ABNORMAL LOW (ref 1.7–2.4)

## 2016-01-13 MED ORDER — ONDANSETRON HCL 4 MG PO TABS: 4.0000 mg | ORAL_TABLET | Freq: Four times a day (QID) | ORAL | Status: DC | PRN

## 2016-01-13 MED ORDER — SODIUM CHLORIDE 0.9 % IV SOLN: INTRAVENOUS | Status: DC

## 2016-01-13 MED ORDER — DEXTROSE-NACL 5-0.45 % IV SOLN
INTRAVENOUS | Status: DC
  Administered 2016-01-13: 04:00:00 via INTRAVENOUS

## 2016-01-13 MED ORDER — ONDANSETRON HCL 4 MG/2ML IJ SOLN: 4.0000 mg | Freq: Four times a day (QID) | INTRAMUSCULAR | Status: DC | PRN

## 2016-01-13 MED ORDER — PROCHLORPERAZINE EDISYLATE 5 MG/ML IJ SOLN
10.0000 mg | INTRAMUSCULAR | Status: DC | PRN
  Administered 2016-01-13: 10 mg via INTRAVENOUS
  Filled 2016-01-13 (×2): qty 2

## 2016-01-13 MED ORDER — ALBUTEROL SULFATE (2.5 MG/3ML) 0.083% IN NEBU: 2.5000 mg | INHALATION_SOLUTION | RESPIRATORY_TRACT | Status: DC | PRN

## 2016-01-13 MED ORDER — SODIUM CHLORIDE 0.9 % IV SOLN
INTRAVENOUS | Status: DC
  Filled 2016-01-13: qty 2.5

## 2016-01-13 MED ORDER — MORPHINE SULFATE (PF) 2 MG/ML IV SOLN
1.0000 mg | INTRAVENOUS | Status: DC | PRN
  Filled 2016-01-13: qty 1

## 2016-01-13 MED ORDER — ACETAMINOPHEN 325 MG PO TABS
650.0000 mg | ORAL_TABLET | Freq: Four times a day (QID) | ORAL | Status: DC | PRN
  Administered 2016-01-14: 650 mg via ORAL
  Filled 2016-01-13: qty 2

## 2016-01-13 MED ORDER — LIVING WELL WITH DIABETES BOOK
Freq: Once | Status: DC
Start: 2016-01-13 — End: 2016-01-14
  Filled 2016-01-13 (×2): qty 1

## 2016-01-13 MED ORDER — ACETAMINOPHEN 325 MG PO TABS: 650.0000 mg | ORAL_TABLET | Freq: Four times a day (QID) | ORAL | Status: DC | PRN

## 2016-01-13 MED ORDER — INSULIN ASPART PROT & ASPART (70-30 MIX) 100 UNIT/ML ~~LOC~~ SUSP
10.0000 [IU] | Freq: Two times a day (BID) | SUBCUTANEOUS | Status: DC
  Administered 2016-01-13 (×2): 10 [IU] via SUBCUTANEOUS
  Filled 2016-01-13: qty 10

## 2016-01-13 MED ORDER — POTASSIUM CHLORIDE CRYS ER 20 MEQ PO TBCR
40.0000 meq | EXTENDED_RELEASE_TABLET | ORAL | Status: AC
  Administered 2016-01-13 (×2): 40 meq via ORAL
  Filled 2016-01-13 (×2): qty 2

## 2016-01-13 MED ORDER — INSULIN REGULAR BOLUS VIA INFUSION
0.0000 [IU] | Freq: Three times a day (TID) | INTRAVENOUS | Status: DC
  Filled 2016-01-13: qty 10

## 2016-01-13 MED ORDER — NICOTINE 21 MG/24HR TD PT24
21.0000 mg | MEDICATED_PATCH | Freq: Every day | TRANSDERMAL | Status: DC
  Administered 2016-01-13 – 2016-01-14 (×2): 21 mg via TRANSDERMAL
  Filled 2016-01-13 (×2): qty 1

## 2016-01-13 MED ORDER — INSULIN ASPART 100 UNIT/ML ~~LOC~~ SOLN
0.0000 [IU] | Freq: Three times a day (TID) | SUBCUTANEOUS | Status: DC
  Administered 2016-01-13: 5 [IU] via SUBCUTANEOUS
  Administered 2016-01-13: 2 [IU] via SUBCUTANEOUS

## 2016-01-13 MED ORDER — INSULIN STARTER KIT- SYRINGES (ENGLISH)
1.0000 | Freq: Once | Status: DC
  Filled 2016-01-13: qty 1

## 2016-01-13 MED ORDER — PNEUMOCOCCAL VAC POLYVALENT 25 MCG/0.5ML IJ INJ
0.5000 mL | INJECTION | INTRAMUSCULAR | Status: AC
  Administered 2016-01-14: 0.5 mL via INTRAMUSCULAR
  Filled 2016-01-13: qty 0.5

## 2016-01-13 MED ORDER — POTASSIUM CHLORIDE IN NACL 20-0.9 MEQ/L-% IV SOLN
INTRAVENOUS | Status: DC
  Administered 2016-01-13 – 2016-01-14 (×2): via INTRAVENOUS
  Filled 2016-01-13 (×2): qty 1000

## 2016-01-13 MED ORDER — PANTOPRAZOLE SODIUM 40 MG PO TBEC
40.0000 mg | DELAYED_RELEASE_TABLET | Freq: Two times a day (BID) | ORAL | Status: DC
  Administered 2016-01-13 – 2016-01-14 (×4): 40 mg via ORAL
  Filled 2016-01-13 (×4): qty 1

## 2016-01-13 MED ORDER — ACETAMINOPHEN 650 MG RE SUPP: 650.0000 mg | Freq: Four times a day (QID) | RECTAL | Status: DC | PRN

## 2016-01-13 MED ORDER — DEXTROSE 50 % IV SOLN: 25.0000 mL | INTRAVENOUS | Status: DC | PRN

## 2016-01-13 MED ORDER — ENOXAPARIN SODIUM 40 MG/0.4ML ~~LOC~~ SOLN
40.0000 mg | SUBCUTANEOUS | Status: DC
  Administered 2016-01-13 – 2016-01-14 (×2): 40 mg via SUBCUTANEOUS
  Filled 2016-01-13 (×2): qty 0.4

## 2016-01-13 MED ORDER — DEXTROSE 5 % IV SOLN
1.0000 g | INTRAVENOUS | Status: DC
  Filled 2016-01-13: qty 10

## 2016-01-13 MED ORDER — SODIUM CHLORIDE 0.9% FLUSH
3.0000 mL | Freq: Two times a day (BID) | INTRAVENOUS | Status: DC
  Administered 2016-01-13 – 2016-01-14 (×4): 3 mL via INTRAVENOUS

## 2016-01-13 NOTE — Progress Notes (Signed)
Nutrition Consult/Brief Note  RD consulted for nutrition education regarding diabetes.  Patient sleeping and snoring upon visit >> unable to wake.  RD provided "Carbohydrate Counting for People with Diabetes" handout from the Academy of Nutrition and Dietetics.  Will return at later date for education follow-up.  Maureen ChattersKatie Huey Scalia, RD, LDN Pager #: [phone] After-Hours Pager #: [phone]

## 2016-01-13 NOTE — Progress Notes (Signed)
Inpatient Diabetes Program Recommendations  AACE/ADA: New Consensus Statement on Inpatient Glycemic Control (2015)  Target Ranges:  Prepandial:   less than 140 mg/dL      Peak postprandial:   less than 180 mg/dL (1-2 hours)      Critically ill patients:  140 - 180 mg/dL   Lab Results  Component Value Date   GLUCAP 162* 01/13/2016   HGBA1C 10.7* 01/08/2016  Results for Pamela MoraleSPRINGFIELD, Aijalon G (MRN [id number]) as of 01/13/2016 15:39  Ref. Range 01/13/2016 03:46 01/13/2016 04:35 01/13/2016 05:50 01/13/2016 06:46 01/13/2016 07:43 01/13/2016 08:55 01/13/2016 10:06 01/13/2016 11:09  Glucose-Capillary Latest Ref Range: 65-99 mg/dL 409230 (H) 811193 (H) 914182 (H) 149 (H) 118 (H) 126 (H) 233 (H) 162 (H)    Review of Glycemic Control  Diabetes history: DM2 Outpatient Diabetes medications: None - Had been prescribed metformin 500 mg bid. Never got prescription filled. Current orders for Inpatient glycemic control: 70/30 10 units bid, Novolog sensitive tidwc  Spoke to pt at length about importance of taking meds as prescribed. Discussed HgbA1C and goals. Stressed importance of diet, exercise daily and monitor blood sugars, and f/u with PCP. Discussed monitoring and hypoglycemia s/s and treatment. Pt states she drinks a lot of [employer]ountain [employer] and eats sweets. Does no exercise. "I just lay on the couch." Pt to view diabetes videos and RN to teach insulin administration. Pt will need affordable insulin at discharge and close f/u with PCP. Pt states she can afford low[facility]cost insulin at [employer].  Inpatient Diabetes Program Recommendations:    Will likely need titration of 70/30 insulin.  Continue with education regarding diabetes with pt actively participating in giving insulin injections, sticking fingers for glucose monitoring, watch diabetes videos, and write down any questions for Diabetes Coordinator to f/u with you.  Question how compliant pt will be at home - appears to be unmotivated to do anything.  F/U in am. Thank  you. Ailene Ardshonda Brunella Wileman, RD, LDN, CDE Inpatient Diabetes Coordinator [phone]

## 2016-01-13 NOTE — Progress Notes (Signed)
Ambulated pt in hallway by tech 500 ft- tolerated well. Back in room sitting at bedside says she's ready to start watching the diabetic videos. Will watch the "500"  series. Videos started for pt asked her to call me when ready for the next one.

## 2016-01-13 NOTE — ED Notes (Signed)
Patient shaking in the bed due to nausea.  Warm blanket given and started IV therapy.  MD made aware.  Will continue to monitor

## 2016-01-13 NOTE — Progress Notes (Addendum)
Pharmacy Antibiotic Note  Pamela Lane is a 55 y.o. female admitted on 01/12/2016 with UTI.  Pharmacy has been consulted for Rocephin dosing.  Plan: Rocephin 1g IV Q24H.  Height: 5\' 3"  (160 cm) Weight: 205 lb 3.2 oz (93.078 kg) IBW/kg (Calculated) : 52.4  Temp (24hrs), Avg:97.5 F (36.4 C), Min:97.5 F (36.4 C), Max:97.5 F (36.4 C)   Recent Labs Lab 01/08/16 1158 01/09/16 0116 01/09/16 0455 01/09/16 0753 01/12/16 2001 01/12/16 2258  WBC 9.7  --   --  5.5 13.1*  --   CREATININE 0.87 0.68 0.70 0.56 1.73*  --   LATICACIDVEN  --   --   --   --   --  1.9    Estimated Creatinine Clearance: 40.3 mL/min (by C-G formula based on Cr of 1.73).    No Known Allergies  Thank you for allowing pharmacy to be a part of this patient's care.  Pamela Lane, PharmD, BCPS  01/13/2016 1:03 AM   --------------------------------------------------------------------------------------------------------- Addendum:  Pharmacy will sign off of this protocol and monitor peripherally since no dose adjustments are expected at this time.   Please re-consult us if additional antibiotic dosing is needed.  Pamela Lane, PharmD, BCPS Clinical Pharmacist Pager: [phone] 01/13/2016 7:53 AM

## 2016-01-13 NOTE — Progress Notes (Signed)
PROGRESS NOTE  Pamela Lane  ZOX:[id number] DOB: 02/15/1961  DOA: 01/12/2016 PCP: No PCP Per Patient   Brief Narrative:  55 year old female, lives with her boyfriend and independent of activities of daily living, family history of DM in niece, PMH of unknown mental disorder, HTN, tobacco abuse, newly diagnosed type II DM during recent [facility] 01/08/16-01/09/16, claimed compliance to discharge medications, [facility] on 01/12/16 with complaints of elevated CBGs, nausea, vomiting, generalized abdominal pain, unable to keep food or liquids down and urinary frequency without dysuria, fever or chills. Workup revealed sodium 124, potassium 4.1, bicarbonate 17, creatinine 1.73, glucose 503 and anion gap of 17. Admitted to stepdown for uncontrolled DM 2 with mild DKA versus starvation ketoacidosis. Transitioned off insulin drip to 70/30 insulin on 7/4. Diabetes coordinator consulted. Monitor overnight and possible DC home on 7/5.   Assessment & Plan:   Principal Problem:   Hyperglycemia due to type 2 diabetes mellitus (HCC) Active Problems:   Hyponatremia   Upper abdominal pain   Nausea & vomiting   UTI (urinary tract infection)   Uncontrolled newly diagnosed DM 2 with mild ketoacidosis - diabetic Vs starvation - Admitted to step down and treated with aggressive IV fluid hydration, insulin drip, close monitoring of BMP. - Improved CBG control, improved bicarbonate, anion gap normalized. - Patient states that she does not have insurance but is willing to take insulin upon discharge. Transitioned to 70/30 insulin at 10 units subcutaneously twice a day and added NovoLog sensitive SSI. She states that she would be able to afford the [employer] brand ([facility] insulin. - Diabetes coordinator and dietitian consulted. - Patient complains of urinary frequency which is probably related to her diabetes but no dysuria or other symptoms suggestive of UTI. Discontinue empirically started  antibiotics and monitor. - Hemoglobin A1c 01/08/16:10.7.  Dehydration with hyponatremia - Improving. Continue IV fluids for additional 24 hours.  Acute kidney injury - Secondary to dehydration and poor oral intake. Resolved. Follow BMP.  Nausea, vomiting and abdominal pain - Possibly related to uncontrolled diabetes and ketoacidosis. Resolved. Start diet and monitor. Treat supportively. - Continue PPI  Hypo-kalemia and hypomagnesemia - Replace and follow.  Tobacco abuse - Cessation counseled. Nicotine patch.  Unknown mental disorder - Seems stable.  Essential hypertension  - Controlled. Lisinopril held d/t AKI.   DVT prophylaxis: Lovenox Code Status: Full Family Communication: None at bedside. Discussed with patient. Disposition Plan: Admitted to stepdown unit. Continue monitoring here for additional 24 hours. Possible DC home 7/5.   Consultants:   None  Procedures:   None  Antimicrobials:   IV Rocephin-discontinued 7/4    Subjective: Feels better. Denies nausea, vomiting, abdominal pain or diarrhea. As per RN, no acute issues.  Objective:  Filed Vitals:   01/13/16 0400 01/13/16 0645 01/13/16 0742 01/13/16 0900  BP: 120/93 105/78 105/78   Pulse: 101  92 86  Temp: 97.8 F (36.6 C)  98.2 F (36.8 C)   TempSrc: Oral  Oral   Resp: 17  19 26   Height:      Weight:      SpO2: 98%  99% 97%    Intake/Output Summary (Last 24 hours) at 01/13/16 1135 Last data filed at 01/13/16 1020  Gross per 24 hour  Intake 1919.67 ml  Output    350 ml  Net 1569.67 ml   Filed Weights   01/12/16 1954 01/13/16 0112  Weight: 93.078 kg (205 lb 3.2 oz) 95.5 kg (210 lb 8.6 oz)  Examination:  General exam: Pleasant middle-aged female lying comfortably supine in bed. Oral mucosa with borderline hydration. Examined with patient's female RN in room. Respiratory system: Clear to auscultation. Respiratory effort normal. Cardiovascular system: S1 & S2 heard, RRR. No JVD,  murmurs, rubs, gallops or clicks. No pedal edema. Telemetry: SR-ST in the 100s. Gastrointestinal system: Abdomen is nondistended, soft and nontender. No organomegaly or masses felt. Normal bowel sounds heard. Central nervous system: Alert and oriented. No focal neurological deficits. Extremities: Symmetric 5 x 5 power. Skin: No rashes, lesions or ulcers Psychiatry: Judgement and insight appear normal. Mood & affect appropriate.     Data Reviewed: I have personally reviewed following labs and imaging studies  CBC:  Recent Labs Lab 01/08/16 1158 01/09/16 0753 01/12/16 2001 01/13/16 0250  WBC 9.7 5.5 13.1* 10.0  HGB 15.1 13.7 15.1* 13.8  HCT 44.1 39.1 42.6 39.7  MCV 87.9 86.2 85.2 84.3  PLT 271 225 318 260   Basic Metabolic Panel:  Recent Labs Lab 01/09/16 0455 01/09/16 0753 01/12/16 2001 01/13/16 0251 01/13/16 0650  NA 135 135 124* 130* 133*  K 3.2* 3.0* 4.1 3.5 2.9*  CL 109 108 90* 99* 101  CO2 18* 19* 17* 18* 20*  GLUCOSE 211* 131* 503* 343* 116*  BUN 8 8 19 17 15   CREATININE 0.70 0.56 1.73* 1.20* 1.01*  CALCIUM 8.5* 8.5* 10.2 9.3 9.5  MG  --   --   --   --  1.6*   GFR: Estimated Creatinine Clearance: 70 mL/min (by C-G formula based on Cr of 1.01). Liver Function Tests:  Recent Labs Lab 01/08/16 1158 01/12/16 2001  AST 12* 18  ALT 16 23  ALKPHOS 109 97  BILITOT 0.4 1.0  PROT 8.4* 8.2*  ALBUMIN 4.6 4.2    Recent Labs Lab 01/08/16 1158  LIPASE 32   No results for input(s): AMMONIA in the last 168 hours. Coagulation Profile: No results for input(s): INR, PROTIME in the last 168 hours. Cardiac Enzymes:  Recent Labs Lab 01/08/16 1926 01/09/16 0116 01/09/16 0753  TROPONINI <0.03 <0.03 <0.03   BNP (last 3 results) No results for input(s): PROBNP in the last 8760 hours. HbA1C: No results for input(s): HGBA1C in the last 72 hours. CBG:  Recent Labs Lab 01/13/16 0646 01/13/16 0743 01/13/16 0855 01/13/16 1006 01/13/16 1109  GLUCAP 149*  118* 126* 233* 162*   Lipid Profile: No results for input(s): CHOL, HDL, LDLCALC, TRIG, CHOLHDL, LDLDIRECT in the last 72 hours. Thyroid Function Tests: No results for input(s): TSH, T4TOTAL, FREET4, T3FREE, THYROIDAB in the last 72 hours. Anemia Panel: No results for input(s): VITAMINB12, FOLATE, FERRITIN, TIBC, IRON, RETICCTPCT in the last 72 hours.  Sepsis Labs:  Recent Labs Lab 01/12/16 2258 01/13/16 0250  LATICACIDVEN 1.9 1.0    Recent Results (from the past 240 hour(s))  Urine culture     Status: Abnormal   Collection Time: 01/05/16  8:02 PM  Result Value Ref Range Status   Specimen Description URINE, CLEAN CATCH  Final   Special Requests NONE  Final   Culture MULTIPLE SPECIES PRESENT, SUGGEST RECOLLECTION (A)  Final   Report Status 01/07/2016 FINAL  Final  Urine culture     Status: Abnormal   Collection Time: 01/08/16 11:58 AM  Result Value Ref Range Status   Specimen Description URINE, CATHETERIZED  Final   Special Requests NONE  Final   Culture MULTIPLE SPECIES PRESENT, SUGGEST RECOLLECTION (A)  Final   Report Status 01/10/2016 FINAL  Final  MRSA PCR Screening     Status: None   Collection Time: 01/08/16  7:05 PM  Result Value Ref Range Status   MRSA by PCR NEGATIVE NEGATIVE Final  Blood culture (routine x 2)     Status: None (Preliminary result)   Collection Time: 01/12/16 10:53 PM  Result Value Ref Range Status   Specimen Description BLOOD LEFT ARM  Final   Special Requests BOTTLES DRAWN AEROBIC AND ANAEROBIC  Final   Culture NO GROWTH < 12 HOURS  Final   Report Status PENDING  Incomplete  Blood culture (routine x 2)     Status: None (Preliminary result)   Collection Time: 01/12/16 10:58 PM  Result Value Ref Range Status   Specimen Description BLOOD LEFT HAND  Final   Special Requests BOTTLES DRAWN AEROBIC AND ANAEROBIC  Final   Culture NO GROWTH < 12 HOURS  Final   Report Status PENDING  Incomplete         Radiology Studies: Dg Chest  Portable 1 View  01/12/2016  CLINICAL DATA:  Cough and shortness of breath EXAM: PORTABLE CHEST 1 VIEW COMPARISON:  None. FINDINGS: The heart size and mediastinal contours are within normal limits. Both lungs are clear. The visualized skeletal structures are unremarkable. IMPRESSION: No active disease. Electronically Signed   By: Paulina Fusi M.D.   On: 01/12/2016 23:03        Scheduled Meds: . enoxaparin (LOVENOX) injection  40 mg Subcutaneous Q24H  . insulin aspart  0-9 Units Subcutaneous TID WC  . insulin aspart protamine- aspart  10 Units Subcutaneous BID WC  . insulin regular  0-10 Units Intravenous TID WC  . nicotine  21 mg Transdermal Daily  . pantoprazole  40 mg Oral BID  . [START ON 01/14/2016] pneumococcal 23 valent vaccine  0.5 mL Intramuscular Tomorrow-1000  . potassium chloride  40 mEq Oral Q4H  . sodium chloride flush  3 mL Intravenous Q12H   Continuous Infusions: . sodium chloride    . 0.9 % NaCl with KCl 20 mEq / L Stopped (01/13/16 0347)  . dextrose 5 % and 0.45% NaCl 100 mL/hr at 01/13/16 0347  . insulin (NOVOLIN-R) infusion Stopped (01/13/16 1111)     LOS: 1 day    Time spent: 45 minutes.    [facility], MD [facility] Pager [phone]  If 7PM-7AM, please contact night-coverage [email or web address] Password [email or web address] 01/13/2016, 11:35 AM

## 2016-01-13 NOTE — Progress Notes (Signed)
Went over Diabetes Sick day rules and Crab count with pt and daughter - pt was not keen on self injecting herself for insulin injections. Wanted daughter to learn. Daughter put it back on her mother ( pt) - let pt examine the syringe. Injected pt in her stomach so she could watch. Taught about injecting with demo. Pt did not want to watch diabetic videos at this time.

## 2016-01-13 NOTE — H&P (Signed)
History and Physical    Pamela Lane YWV:[id number] DOB: 12/24/1960 DOA: 01/12/2016  Referring MD/NP/PA: Dr. Geronimo Boot resident PCP: No PCP Per Patient  Patient coming from:  Home  Chief Complaint: My blood sugars were high  HPI: Pamela Lane is a 55 y.o. female with medical history significant of HTN, mental disorder unknown, tobacco abuse, diabetes mellitus type 2; who presents with complaints of elevated blood sugars. She reports that she recently was diagnosed with diabetes on the 26th of last month. She had been given metformin and lisinopril at discharge, but notes that her blood sugars have remained elevated. Since being home she has had nausea, vomiting, and generalized abdominal pain. She describes the abdominal pain is sharp and has been unable to keep any food or liquids down. Associated symptoms include that feels like her chest is burning and urinary frequency. She denies any dysuria or discharge. She reports smoking 1-2 packs cigarettes per day since about the age of 36. Denies being a daily alcoholic drinker.   ED Course: Upon admission submersion department patient was evaluated and seen to be afebrile, respirations up to 23, heart rates up to 111, and all other vital signs within normal limits. Lab work revealed WBC of 13.1, hemoglobin 15.1, sodium 124, potassium 4.1, chloride 90, CO2 17, BUN 19, creatinine 1.73, glucose 503, lactic acid 1.9, and anion gap 17. Chest x-ray showed no acute abnormalities.  Review of Systems: As per HPI otherwise 10 point review of systems negative.   Past Medical History  Diagnosis Date  . Mental disorder     pt reports 'I have all of them mental disorders'  . Hypertension   . Diabetes mellitus without complication [facility])     Past Surgical History  Procedure Laterality Date  . Abdominal hysterectomy       reports that she has been smoking Cigarettes.  She has been smoking about 1.00 pack per day. She does not have any  smokeless tobacco history on file. She reports that she uses illicit drugs (Marijuana). She reports that she does not drink alcohol.  No Known Allergies  No family history on file.  Prior to Admission medications   Medication Sig Start Date End Date Taking? Authorizing Provider  cephALEXin (KEFLEX) 500 MG capsule Take 1 capsule (500 mg total) by mouth every 12 (twelve) hours. Patient taking differently: Take 500 mg by mouth every 12 (twelve) hours. 5 day course filled 01/09/16 01/09/16  Yes Fritzi Mandes, MD  lisinopril (PRINIVIL,ZESTRIL) 5 MG tablet Take 1 tablet (5 mg total) by mouth daily. 01/09/16  Yes Fritzi Mandes, MD  metFORMIN (GLUCOPHAGE) 500 MG tablet Take 1 tablet (500 mg total) by mouth 2 (two) times daily with a meal. 01/09/16  Yes Fritzi Mandes, MD  blood glucose meter kit and supplies KIT Dispense based on patient and insurance preference. Use up to four times daily as directed. (FOR ICD-9 250.00, 250.01). 01/05/16   Joanne Gavel, MD  hydrOXYzine (ATARAX/VISTARIL) 25 MG tablet Take 1 tablet (25 mg total) by mouth 3 (three) times daily as needed for itching. Patient not taking: Reported on 01/12/2016 10/07/15   Hinda Kehr, MD    Physical Exam:   Constitutional: older female in mild discomfort able to follow commands.  Filed Vitals:   01/12/16 1954 01/12/16 2345 01/13/16 0000 01/13/16 0015  BP: 117/81 140/73 148/69 143/65  Pulse: 111 98 93 93  Temp: 97.5 F (36.4 C)     TempSrc: Oral     Resp:  _0 Height: _1  (1.6 m)     Weight: 93.078 kg (205 lb 3.2 oz)     SpO2: 98% 100% 100% 100%   Eyes: PERRL, lids and conjunctivae normal ENMT: Mucous membranes are moist. Posterior pharynx clear of any exudate or lesions.Normal dentition.  Neck: normal, supple, no masses, no thyromegaly Respiratory: clear to auscultation bilaterally, no wheezing, no crackles. Normal respiratory effort. No accessory muscle use.  Cardiovascular: Regular rate and rhythm, no murmurs / rubs / gallops. No  extremity edema. 2+ pedal pulses. No carotid bruits.  Abdomen: Generalized tenderness to palpation of the abdomen, no masses palpated. No hepatosplenomegaly. Bowel sounds positive.  Musculoskeletal: no clubbing / cyanosis. No joint deformity upper and lower extremities. Good ROM, no contractures. Normal muscle tone.  Skin: no rashes, lesions, ulcers. No induration Neurologic: CN 2-12 grossly intact. Sensation intact, DTR normal. Strength 5/5 in all 4.  Psychiatric: Normal judgment and insight. Alert and oriented x 3. Normal mood.     Labs on Admission: I have personally reviewed following labs and imaging studies  CBC:  Recent Labs Lab 01/08/16 1158 01/09/16 0753 01/12/16 2001  WBC 9.7 5.5 13.1*  HGB 15.1 13.7 15.1*  HCT 44.1 39.1 42.6  MCV 87.9 86.2 85.2  PLT 271 225 549   Basic Metabolic Panel:  Recent Labs Lab 01/08/16 1158 01/09/16 0116 01/09/16 0455 01/09/16 0753 01/12/16 2001  NA 131* 136 135 135 124*  K 4.1 3.2* 3.2* 3.0* 4.1  CL 102 109 109 108 90*  CO2 15* 19* 18* 19* 17*  GLUCOSE 498* 166* 211* 131* 503*  BUN _2 CREATININE 0.87 0.68 0.70 0.56 1.73*  CALCIUM 9.9 8.5* 8.5* 8.5* 10.2   GFR: Estimated Creatinine Clearance: 40.3 mL/min (by C-G formula based on Cr of 1.73). Liver Function Tests:  Recent Labs Lab 01/08/16 1158 01/12/16 2001  AST 12* 18  ALT 16 23  ALKPHOS 109 97  BILITOT 0.4 1.0  PROT 8.4* 8.2*  ALBUMIN 4.6 4.2    Recent Labs Lab 01/08/16 1158  LIPASE 32   No results for input(s): AMMONIA in the last 168 hours. Coagulation Profile: No results for input(s): INR, PROTIME in the last 168 hours. Cardiac Enzymes:  Recent Labs Lab 01/08/16 1926 01/09/16 0116 01/09/16 0753  TROPONINI <0.03 <0.03 <0.03   BNP (last 3 results) No results for input(s): PROBNP in the last 8760 hours. HbA1C: No results for input(s): HGBA1C in the last 72 hours. CBG:  Recent Labs Lab 01/09/16 1014 01/09/16 1125 01/12/16 1953  01/12/16 2246 01/12/16 2330  GLUCAP 119* 185* 484* 519* 465*   Lipid Profile: No results for input(s): CHOL, HDL, LDLCALC, TRIG, CHOLHDL, LDLDIRECT in the last 72 hours. Thyroid Function Tests: No results for input(s): TSH, T4TOTAL, FREET4, T3FREE, THYROIDAB in the last 72 hours. Anemia Panel: No results for input(s): VITAMINB12, FOLATE, FERRITIN, TIBC, IRON, RETICCTPCT in the last 72 hours. Urine analysis:    Component Value Date/Time   COLORURINE YELLOW 01/12/2016 2003   APPEARANCEUR CLOUDY* 01/12/2016 2003   LABSPEC 1.028 01/12/2016 2003   PHURINE 5.5 01/12/2016 2003   GLUCOSEU >1000* 01/12/2016 2003   HGBUR SMALL* 01/12/2016 2003   BILIRUBINUR MODERATE* 01/12/2016 2003   KETONESUR 40* 01/12/2016 2003   PROTEINUR 30* 01/12/2016 2003   NITRITE NEGATIVE 01/12/2016 2003   LEUKOCYTESUR MODERATE* 01/12/2016 2003   Sepsis Labs: Recent Results (from the past 240 hour(s))  Urine culture     Status: Abnormal  Collection Time: 01/05/16  8:02 PM  Result Value Ref Range Status   Specimen Description URINE, CLEAN CATCH  Final   Special Requests NONE  Final   Culture MULTIPLE SPECIES PRESENT, SUGGEST RECOLLECTION (A)  Final   Report Status 01/07/2016 FINAL  Final  Urine culture     Status: Abnormal   Collection Time: 01/08/16 11:58 AM  Result Value Ref Range Status   Specimen Description URINE, CATHETERIZED  Final   Special Requests NONE  Final   Culture MULTIPLE SPECIES PRESENT, SUGGEST RECOLLECTION (A)  Final   Report Status 01/10/2016 FINAL  Final  MRSA PCR Screening     Status: None   Collection Time: 01/08/16  7:05 PM  Result Value Ref Range Status   MRSA by PCR NEGATIVE NEGATIVE Final     Radiological Exams on Admission: Dg Chest Portable 1 View  01/12/2016  CLINICAL DATA:  Cough and shortness of breath EXAM: PORTABLE CHEST 1 VIEW COMPARISON:  None. FINDINGS: The heart size and mediastinal contours are within normal limits. Both lungs are clear. The visualized skeletal  structures are unremarkable. IMPRESSION: No active disease. Electronically Signed   By: Nelson Chimes M.D.   On: 01/12/2016 23:03    Assessment/Plan Diabetes mellitus type 2 with hyperglycemia: Glucose elevated at 503 on admission. Hemoglobin A1c was noted to be 10.7 per her last admission.Patient also found to have elevated anion gap of 17 with ketones present. Initial venous pH noted to be 7.318 and therefore did not suspect DKA. - Admit to stepdown - Glucose stabilizer protocol, transition over to subcutaneous insulin when able - BMP every 4 hours 3 - Held metformin  - Diabetic educator  - Social work  Urinary tract infection: Seen on previous admission. - Follow-up urine culture - Continue Rocephin per pharmacy  Nausea and vomiting - Continue Zofran  Essential hypertension - Lisinopril held secondary to  Leukocytosis: Acute. WBC elevated at 13.1. - Follow repeat CBC in a.m.   Acute kidney injury: Baseline creatinine less than 1, but on admission creatinine 1.73 with a BUN 19. - IV Fluids - Repeat BMP in a.m.  Tobacco abuse - Nicotine patch - Counseled on the need of cessation of use  DVT prophylaxis: lovenox Code Status: Full Family Communication: No family present at bedside*  Disposition Plan: Possible discharge home in 2-3 days Consults called: None  Admission status: Observation MedSurg   Norval Morton MD [facility] Pager [phone]  If 7PM-7AM, please contact night-coverage [email or web address] Password [email or web address]  01/13/2016, 12:23 AM

## 2016-01-13 NOTE — ED Notes (Signed)
MD changed order to only have one bolus of fluids.  Patient received approximately of fluids in bag

## 2016-01-13 NOTE — Progress Notes (Signed)
Pt self injected first insulin dose of 10 u of 70/30 into Lt thigh. Needs reinforcement and learn how to draw up insulin from vial. Watched Diabetic videos 501 through 505 - says that's enough for today. Also not a morning person & would like to finish them tomorrow afternoon.

## 2016-01-14 DIAGNOSIS — N179 Acute kidney failure, unspecified: Secondary | ICD-10-CM | POA: Insufficient documentation

## 2016-01-14 DIAGNOSIS — E1110 Type 2 diabetes mellitus with ketoacidosis without coma: Secondary | ICD-10-CM | POA: Insufficient documentation

## 2016-01-14 DIAGNOSIS — E1165 Type 2 diabetes mellitus with hyperglycemia: Secondary | ICD-10-CM

## 2016-01-14 DIAGNOSIS — E860 Dehydration: Secondary | ICD-10-CM

## 2016-01-14 DIAGNOSIS — E1310 Other specified diabetes mellitus with ketoacidosis without coma: Principal | ICD-10-CM

## 2016-01-14 LAB — BASIC METABOLIC PANEL
Anion gap: 7 (ref 5–15)
BUN: 12 mg/dL (ref 6–20)
CALCIUM: 8.6 mg/dL — AB (ref 8.9–10.3)
CO2: 22 mmol/L (ref 22–32)
Chloride: 104 mmol/L (ref 101–111)
Creatinine, Ser: 0.92 mg/dL (ref 0.44–1.00)
GFR calc Af Amer: >60 mL/min (ref >60)
GLUCOSE: 270 mg/dL — AB (ref 65–99)
Potassium: 4.1 mmol/L (ref 3.5–5.1)
Sodium: 133 mmol/L — ABNORMAL LOW (ref 135–145)

## 2016-01-14 LAB — GLUCOSE, CAPILLARY: Glucose-Capillary: 263 mg/dL — ABNORMAL HIGH (ref 65–99)

## 2016-01-14 LAB — URINE CULTURE

## 2016-01-14 MED ORDER — METFORMIN HCL 500 MG PO TABS: 500.0000 mg | ORAL_TABLET | Freq: Two times a day (BID) | ORAL | Status: DC

## 2016-01-14 MED ORDER — INSULIN ASPART 100 UNIT/ML ~~LOC~~ SOLN: 0.0000 [IU] | Freq: Every day | SUBCUTANEOUS | Status: DC

## 2016-01-14 MED ORDER — INSULIN ASPART 100 UNIT/ML ~~LOC~~ SOLN
0.0000 [IU] | Freq: Three times a day (TID) | SUBCUTANEOUS | Status: DC
  Administered 2016-01-14: 8 [IU] via SUBCUTANEOUS

## 2016-01-14 MED ORDER — INSULIN ASPART PROT & ASPART (70-30 MIX) 100 UNIT/ML ~~LOC~~ SUSP: 15.0000 [IU] | Freq: Two times a day (BID) | SUBCUTANEOUS | Status: DC

## 2016-01-14 MED ORDER — INSULIN ASPART 100 UNIT/ML ~~LOC~~ SOLN: 0.0000 [IU] | Freq: Three times a day (TID) | SUBCUTANEOUS | Status: DC

## 2016-01-14 MED ORDER — INSULIN NPH ISOPHANE & REGULAR (70-30) 100 UNIT/ML ~~LOC~~ SUSP: 15.0000 [IU] | Freq: Two times a day (BID) | SUBCUTANEOUS | Status: DC

## 2016-01-14 MED ORDER — INSULIN ASPART PROT & ASPART (70-30 MIX) 100 UNIT/ML ~~LOC~~ SUSP
15.0000 [IU] | Freq: Two times a day (BID) | SUBCUTANEOUS | Status: DC
  Administered 2016-01-14: 15 [IU] via SUBCUTANEOUS
  Filled 2016-01-14: qty 10

## 2016-01-14 NOTE — Discharge Summary (Signed)
Physician Discharge Summary  Pamela Lane [facility] AGT:[id number] DOB: 07-01-1961 DOA: 01/12/2016  PCP: No PCP Per Patient  Admit date: 01/12/2016 Discharge date: 01/14/2016  Admitted From: Home Disposition:  Home  Recommendations for Outpatient Follow-up:  1. Follow up with PCP in 1-2 weeks 2. Please obtain BMP/CBC in one week   Home Health:No Equipment/Devices:None  Discharge Condition:stable CODE STATUS:FULL Diet recommendation: Heart Healthy / Carb Modified  Brief/Interim Summary:   Discharge Diagnoses:  Uncontrolled newly diagnosed DM 2 with mild ketoacidosis - diabetic Vs starvation - Admitted to step down and treated with aggressive IV fluid hydration, insulin drip, close monitoring of BMP. - Improved CBG control, improved bicarbonate, anion gap normalized. - Patient states that she does not have insurance but is willing to take insulin upon discharge. Transitioned to 70/30 insulin at 10 units subcutaneously twice a day and added NovoLog sensitive SSI. She states that she would be able to afford the Relion brand (at [facility]) insulin. -home with 70/30 insulin--15 units bid; restart metformin after d/c - Diabetes coordinator and dietitian consulted. - Patient complains of urinary frequency which is probably related to her diabetes but no dysuria or other symptoms suggestive of UTI. Discontinue empirically started antibiotics and monitor. - Hemoglobin A1c 01/08/16:10.7.  Dehydration with hyponatremia - Improve with IVF  Acute kidney injury - Secondary to dehydration and poor oral intake. Resolved. Follow BMP. -serum creatinine 0.92 on day of d/c  Nausea, vomiting and abdominal pain - Possibly related to uncontrolled diabetes and ketoacidosis. Resolved.  Treat supportively. - Continue PPI -tolerating carb modified diet   Hypo-kalemia and hypomagnesemia - Replaced and follow.  Tobacco abuse - Cessation counseled. Nicotine patch.  Unknown mental disorder -  stable.  Essential hypertension  - Controlled. Lisinopril held d/t AKI. -restart lisinopril after d/c   Discharge Instructions      Discharge Instructions    Diet Carb Modified    Complete by:  As directed      Increase activity slowly    Complete by:  As directed             Medication List    STOP taking these medications        cephALEXin 500 MG capsule  Commonly known as:  KEFLEX     hydrOXYzine 25 MG tablet  Commonly known as:  ATARAX/VISTARIL      TAKE these medications        blood glucose meter kit and supplies Kit  Dispense based on patient and insurance preference. Use up to four times daily as directed. (FOR ICD-9 250.00, 250.01).     insulin NPH-regular Human (70-30) 100 UNIT/ML injection  Commonly known as:  NOVOLIN 70/30  Inject 15 Units into the skin 2 (two) times daily with a meal.     lisinopril 5 MG tablet  Commonly known as:  PRINIVIL,ZESTRIL  Take 1 tablet (5 mg total) by mouth daily.     metFORMIN 500 MG tablet  Commonly known as:  GLUCOPHAGE  Take 1 tablet (500 mg total) by mouth 2 (two) times daily with a meal.        No Known Allergies  Consultations:  none   Procedures/Studies: Dg Chest 2 View  01/05/2016  CLINICAL DATA:  Shortness of breath, weakness and nausea 3 weeks. Smoker. EXAM: CHEST  2 VIEW COMPARISON:  None. FINDINGS: Lungs are adequately inflated without consolidation or effusion. Cardiomediastinal silhouette, bones and soft tissues are within normal. IMPRESSION: No active cardiopulmonary disease. Electronically Signed   By: Quillian Quince  Derrel Nip M.D.   On: 01/05/2016 18:51   Dg Chest Portable 1 View  01/12/2016  CLINICAL DATA:  Cough and shortness of breath EXAM: PORTABLE CHEST 1 VIEW COMPARISON:  None. FINDINGS: The heart size and mediastinal contours are within normal limits. Both lungs are clear. The visualized skeletal structures are unremarkable. IMPRESSION: No active disease. Electronically Signed   By: Nelson Chimes M.D.    On: 01/12/2016 23:03        Discharge Exam: Filed Vitals:   01/14/16 0400 01/14/16 0810  BP: 93/58 145/70  Pulse:  78  Temp:  97.4 F (36.3 C)  Resp: 14 14   Filed Vitals:   01/13/16 2350 01/14/16 0351 01/14/16 0400 01/14/16 0810  BP: 109/70  93/58 145/70  Pulse:    78  Temp: 98.4 F (36.9 C) 98.2 F (36.8 C)  97.4 F (36.3 C)  TempSrc: Oral Oral  Oral  Resp: '29  14 14  ' Height:      Weight:      SpO2: 100%  97% 100%    General: Pt is alert, awake, not in acute distress Cardiovascular: RRR, S1/S2 +, no rubs, no gallops Respiratory: CTA bilaterally, no wheezing, no rhonchi Abdominal: Soft, NT, ND, bowel sounds + Extremities: no edema, no cyanosis   The results of significant diagnostics from this hospitalization (including imaging, microbiology, ancillary and laboratory) are listed below for reference.    Significant Diagnostic Studies: Dg Chest 2 View  01/05/2016  CLINICAL DATA:  Shortness of breath, weakness and nausea 3 weeks. Smoker. EXAM: CHEST  2 VIEW COMPARISON:  None. FINDINGS: Lungs are adequately inflated without consolidation or effusion. Cardiomediastinal silhouette, bones and soft tissues are within normal. IMPRESSION: No active cardiopulmonary disease. Electronically Signed   By: Marin Olp M.D.   On: 01/05/2016 18:51   Dg Chest Portable 1 View  01/12/2016  CLINICAL DATA:  Cough and shortness of breath EXAM: PORTABLE CHEST 1 VIEW COMPARISON:  None. FINDINGS: The heart size and mediastinal contours are within normal limits. Both lungs are clear. The visualized skeletal structures are unremarkable. IMPRESSION: No active disease. Electronically Signed   By: Nelson Chimes M.D.   On: 01/12/2016 23:03     Microbiology: Recent Results (from the past 240 hour(s))  Urine culture     Status: Abnormal   Collection Time: 01/05/16  8:02 PM  Result Value Ref Range Status   Specimen Description URINE, CLEAN CATCH  Final   Special Requests NONE  Final   Culture  MULTIPLE SPECIES PRESENT, SUGGEST RECOLLECTION (A)  Final   Report Status 01/07/2016 FINAL  Final  Urine culture     Status: Abnormal   Collection Time: 01/08/16 11:58 AM  Result Value Ref Range Status   Specimen Description URINE, CATHETERIZED  Final   Special Requests NONE  Final   Culture MULTIPLE SPECIES PRESENT, SUGGEST RECOLLECTION (A)  Final   Report Status 01/10/2016 FINAL  Final  MRSA PCR Screening     Status: None   Collection Time: 01/08/16  7:05 PM  Result Value Ref Range Status   MRSA by PCR NEGATIVE NEGATIVE Final  Urine culture     Status: Abnormal   Collection Time: 01/12/16  8:03 PM  Result Value Ref Range Status   Specimen Description URINE, CLEAN CATCH  Final   Special Requests NONE  Final   Culture MULTIPLE SPECIES PRESENT, SUGGEST RECOLLECTION (A)  Final   Report Status 01/14/2016 FINAL  Final  Blood culture (routine x 2)  Status: None (Preliminary result)   Collection Time: 01/12/16 10:53 PM  Result Value Ref Range Status   Specimen Description BLOOD LEFT ARM  Final   Special Requests BOTTLES DRAWN AEROBIC AND ANAEROBIC 5ML  Final   Culture NO GROWTH < 12 HOURS  Final   Report Status PENDING  Incomplete  Blood culture (routine x 2)     Status: None (Preliminary result)   Collection Time: 01/12/16 10:58 PM  Result Value Ref Range Status   Specimen Description BLOOD LEFT HAND  Final   Special Requests BOTTLES DRAWN AEROBIC AND ANAEROBIC 5ML  Final   Culture NO GROWTH < 12 HOURS  Final   Report Status PENDING  Incomplete     Labs: Basic Metabolic Panel:  Recent Labs Lab 01/12/16 2001 01/13/16 0251 01/13/16 0650 01/13/16 1651 01/14/16 0554  NA 124* 130* 133* 131* 133*  K 4.1 3.5 2.9* 3.7 4.1  CL 90* 99* 101 102 104  CO2 17* 18* 20* 20* 22  GLUCOSE 503* 343* 116* 268* 270*  BUN '19 17 15 14 12  ' CREATININE 1.73* 1.20* 1.01* 1.01* 0.92  CALCIUM 10.2 9.3 9.5 9.0 8.6*  MG  --   --  1.6*  --   --    Liver Function Tests:  Recent Labs Lab  01/08/16 1158 01/12/16 2001  AST 12* 18  ALT 16 23  ALKPHOS 109 97  BILITOT 0.4 1.0  PROT 8.4* 8.2*  ALBUMIN 4.6 4.2    Recent Labs Lab 01/08/16 1158  LIPASE 32   No results for input(s): AMMONIA in the last 168 hours. CBC:  Recent Labs Lab 01/08/16 1158 01/09/16 0753 01/12/16 2001 01/13/16 0250  WBC 9.7 5.5 13.1* 10.0  HGB 15.1 13.7 15.1* 13.8  HCT 44.1 39.1 42.6 39.7  MCV 87.9 86.2 85.2 84.3  PLT 271 225 318 260   Cardiac Enzymes:  Recent Labs Lab 01/08/16 1926 01/09/16 0116 01/09/16 0753  TROPONINI <0.03 <0.03 <0.03   BNP: Invalid input(s): POCBNP CBG:  Recent Labs Lab 01/13/16 1006 01/13/16 1109 01/13/16 1627 01/13/16 2153 01/14/16 0809  GLUCAP 233* 162* 270* 271* 263*    Time coordinating discharge:  Greater than 30 minutes  Signed:  Prudy Candy, DO [facility] Pager: [phone] 01/14/2016, 9:52 AM

## 2016-01-14 NOTE — Progress Notes (Addendum)
01/14/2016 Rn went over patient discharge sheet, and she was told what medication to take when she goes home and to take her insulin. Rn reported to Rn Dr Tat had change her 70/30 to NPH she was told to  Make sure pick up her insulin prescription and she was shown how to read the insulin syringe and to give self. Patient reported to nurse unable to see the line on the syringe, Rn told patient daughter she will need help pull up her insulin. Patient was also given the living well book.[facility] PLL[facility] Maja Mccaffery RN.

## 2016-01-14 NOTE — Progress Notes (Signed)
01/14/2016 Pneumococcal vaccine given in left deltoid at 0905. Lot #: [id number] and Expire May 26, 2017. [facility] T[facility] Lec[facility] RN.

## 2016-01-17 LAB — CULTURE, BLOOD (ROUTINE X 2)
CULTURE: NO GROWTH
CULTURE: NO GROWTH

## 2016-05-11 ENCOUNTER — Encounter: Payer: Self-pay | Admitting: Emergency Medicine

## 2016-05-11 ENCOUNTER — Emergency Department: Payer: Self-pay

## 2016-05-11 ENCOUNTER — Emergency Department
Admission: EM | Admit: 2016-05-11 | Discharge: 2016-05-11 | Disposition: A | Discharge status: Home / Self Care | Payer: Self-pay | Attending: Emergency Medicine | Admitting: Emergency Medicine

## 2016-05-11 DIAGNOSIS — E119 Type 2 diabetes mellitus without complications: Secondary | ICD-10-CM | POA: Insufficient documentation

## 2016-05-11 DIAGNOSIS — F17210 Nicotine dependence, cigarettes, uncomplicated: Secondary | ICD-10-CM | POA: Insufficient documentation

## 2016-05-11 DIAGNOSIS — Z794 Long term (current) use of insulin: Secondary | ICD-10-CM | POA: Insufficient documentation

## 2016-05-11 DIAGNOSIS — K859 Acute pancreatitis without necrosis or infection, unspecified: Secondary | ICD-10-CM | POA: Insufficient documentation

## 2016-05-11 DIAGNOSIS — I10 Essential (primary) hypertension: Secondary | ICD-10-CM | POA: Insufficient documentation

## 2016-05-11 DIAGNOSIS — Z79899 Other long term (current) drug therapy: Secondary | ICD-10-CM | POA: Insufficient documentation

## 2016-05-11 DIAGNOSIS — R1013 Epigastric pain: Secondary | ICD-10-CM

## 2016-05-11 LAB — COMPREHENSIVE METABOLIC PANEL
ALBUMIN: 4.2 g/dL (ref 3.5–5.0)
ALT: 16 U/L (ref 14–54)
ANION GAP: 11 (ref 5–15)
AST: 15 U/L (ref 15–41)
Alkaline Phosphatase: 76 U/L (ref 38–126)
BUN: 15 mg/dL (ref 6–20)
CO2: 25 mmol/L (ref 22–32)
Calcium: 9.4 mg/dL (ref 8.9–10.3)
Chloride: 99 mmol/L — ABNORMAL LOW (ref 101–111)
Creatinine, Ser: 0.83 mg/dL (ref 0.44–1.00)
GFR calc Af Amer: >60 mL/min (ref >60)
GLUCOSE: 154 mg/dL — AB (ref 65–99)
POTASSIUM: 3.5 mmol/L (ref 3.5–5.1)
Sodium: 135 mmol/L (ref 135–145)
TOTAL PROTEIN: 8.6 g/dL — AB (ref 6.5–8.1)

## 2016-05-11 LAB — TYPE AND SCREEN
ABO/RH(D): A POS
ANTIBODY SCREEN: NEGATIVE

## 2016-05-11 LAB — URINALYSIS COMPLETE WITH MICROSCOPIC (ARMC ONLY)
BILIRUBIN URINE: NEGATIVE
GLUCOSE, UA: NEGATIVE mg/dL
HGB URINE DIPSTICK: NEGATIVE
Ketones, ur: NEGATIVE mg/dL
NITRITE: NEGATIVE
Protein, ur: NEGATIVE mg/dL
SPECIFIC GRAVITY, URINE: 1.01 (ref 1.005–1.030)
pH: 6 (ref 5.0–8.0)

## 2016-05-11 LAB — CBC
HEMATOCRIT: 42.8 % (ref 35.0–47.0)
HEMOGLOBIN: 14.8 g/dL (ref 12.0–16.0)
MCH: 30.9 pg (ref 26.0–34.0)
MCHC: 34.5 g/dL (ref 32.0–36.0)
MCV: 89.5 fL (ref 80.0–100.0)
Platelets: 315 10*3/uL (ref 150–440)
RBC: 4.79 MIL/uL (ref 3.80–5.20)
RDW: 11.9 % (ref 11.5–14.5)
WBC: 10.1 10*3/uL (ref 3.6–11.0)

## 2016-05-11 LAB — LIPASE, BLOOD: Lipase: 67 U/L — ABNORMAL HIGH (ref 11–51)

## 2016-05-11 MED ORDER — IOPAMIDOL (ISOVUE-300) INJECTION 61%
30.0000 mL | Freq: Once | INTRAVENOUS | Status: AC | PRN
  Administered 2016-05-11: 30 mL via ORAL

## 2016-05-11 MED ORDER — SODIUM CHLORIDE 0.9 % IV BOLUS (SEPSIS)
1000.0000 mL | Freq: Once | INTRAVENOUS | Status: AC
  Administered 2016-05-11: 1000 mL via INTRAVENOUS

## 2016-05-11 MED ORDER — IOPAMIDOL (ISOVUE-300) INJECTION 61%
100.0000 mL | Freq: Once | INTRAVENOUS | Status: AC | PRN
  Administered 2016-05-11: 100 mL via INTRAVENOUS

## 2016-05-11 MED ORDER — MORPHINE SULFATE (PF) 2 MG/ML IV SOLN: 4.0000 mg | Freq: Once | INTRAVENOUS | Status: DC

## 2016-05-11 MED ORDER — OXYCODONE-ACETAMINOPHEN 5-325 MG PO TABS: 1.0000 | ORAL_TABLET | ORAL | 0 refills | Status: DC | PRN

## 2016-05-11 MED ORDER — OXYCODONE-ACETAMINOPHEN 5-325 MG PO TABS
1.0000 | ORAL_TABLET | Freq: Once | ORAL | Status: AC
  Administered 2016-05-11: 1 via ORAL
  Filled 2016-05-11: qty 1

## 2016-05-11 MED ORDER — MORPHINE SULFATE (PF) 2 MG/ML IV SOLN
4.0000 mg | Freq: Once | INTRAVENOUS | Status: AC
  Administered 2016-05-11: 4 mg via INTRAVENOUS
  Filled 2016-05-11: qty 2

## 2016-05-11 MED ORDER — ONDANSETRON HCL 4 MG PO TABS: 4.0000 mg | ORAL_TABLET | Freq: Three times a day (TID) | ORAL | 0 refills | Status: DC | PRN

## 2016-05-11 MED ORDER — ONDANSETRON HCL 4 MG/2ML IJ SOLN
4.0000 mg | Freq: Once | INTRAMUSCULAR | Status: AC
  Administered 2016-05-11: 4 mg via INTRAVENOUS
  Filled 2016-05-11: qty 2

## 2016-05-11 NOTE — ED Provider Notes (Signed)
[facility] Emergency Department Provider Note   ____________________________________________   I have reviewed the triage vital signs and the nursing notes.   HISTORY  Chief Complaint Abdominal Pain   History limited by: Not Limited   HPI Pamela Lane is a 55 y.o. female who presents to the emergency department today because of concern for abdominal pain. It is located in the epigastric and right upper quadrant. It started roughly 2 weeks ago. It has been fairly constant. She has felt it is worse with po intake. She has had associated vomiting. The emesis has been non bloody. States she had one episode of black stool. She denies similar symptoms in the past. Denies any fevers.   Past Medical History:  Diagnosis Date  . Diabetes mellitus without complication (HCC)   . Hypertension   . Mental disorder    pt reports 'I have all of them mental disorders'    Patient Active Problem List   Diagnosis Date Noted  . AKI (acute kidney injury) (HCC)   . Diabetic ketoacidosis without coma associated with type 2 diabetes mellitus (HCC)   . Hyperglycemia due to type 2 diabetes mellitus (HCC) 01/13/2016  . UTI (urinary tract infection) 01/13/2016  . DKA (diabetic ketoacidoses) (HCC) 01/08/2016  . Hyponatremia 01/08/2016  . Dehydration 01/08/2016  . Urinary tract infection 01/08/2016  . Upper abdominal pain 01/08/2016  . Nausea & vomiting 01/08/2016    Past Surgical History:  Procedure Laterality Date  . ABDOMINAL HYSTERECTOMY      Prior to Admission medications   Medication Sig Start Date End Date Taking? Authorizing Provider  blood glucose meter kit and supplies KIT Dispense based on patient and insurance preference. Use up to four times daily as directed. (FOR ICD-9 250.00, 250.01). 01/05/16   Eryka A Gayle, MD  insulin NPH-regular Human (NOVOLIN 70/30) (70-30) 100 UNIT/ML injection Inject 15 Units into the skin 2 (two) times daily with a meal.  01/14/16   David Tat, MD  lisinopril (PRINIVIL,ZESTRIL) 5 MG tablet Take 1 tablet (5 mg total) by mouth daily. 01/09/16   Sona Patel, MD  metFORMIN (GLUCOPHAGE) 500 MG tablet Take 1 tablet (500 mg total) by mouth 2 (two) times daily with a meal. 01/14/16   David Tat, MD    Allergies Review of patient's allergies indicates no known allergies.  No family history on file.  Social History Social History  Substance Use Topics  . Smoking status: Current Every Day Smoker    Packs/day: 1.00    Types: Cigarettes  . Smokeless tobacco: Never Used  . Alcohol use No     Comment: every once in a blue moon    Review of Systems  Constitutional: Negative for fever. Cardiovascular: Negative for chest pain. Respiratory: Negative for shortness of breath. Gastrointestinal: Positive for abdominal pain, emesis. Genitourinary: Negative for dysuria. Musculoskeletal: Negative for back pain. Skin: Negative for rash. Neurological: Negative for headaches, focal weakness or numbness.  10-point ROS otherwise negative.  ____________________________________________   PHYSICAL EXAM:  VITAL SIGNS: ED Triage Vitals [05/11/16 1532]  Enc Vitals Group     BP 125/82     Pulse Rate (!) 105     Resp 18     Temp 98.4 F (36.9 C)     Temp Source Oral     SpO2 96 %     Weight 222 lb (100.7 kg)     Height 5' 3" (1.6 m)     Head Circumference        Peak Flow      Pain Score 7   Constitutional: Alert and oriented. Appears uncomfortable.  Eyes: Conjunctivae are normal. Normal extraocular movements. ENT   Head: Normocephalic and atraumatic.   Nose: No congestion/rhinnorhea.   Mouth/Throat: Mucous membranes are moist.   Neck: No stridor. Hematological/Lymphatic/Immunilogical: No cervical lymphadenopathy. Cardiovascular: Normal rate, regular rhythm.  No murmurs, rubs, or gallops.  Respiratory: Normal respiratory effort without tachypnea nor retractions. Breath sounds are clear and equal  bilaterally. No wheezes/rales/rhonchi. Gastrointestinal: Soft and tender to palpation in the epigastric and right upper quadrants.  Genitourinary: Deferred Musculoskeletal: Normal range of motion in all extremities. No lower extremity edema. Neurologic:  Normal speech and language. No gross focal neurologic deficits are appreciated.  Skin:  Skin is warm, dry and intact. No rash noted. Psychiatric: Mood and affect are normal. Speech and behavior are normal. Patient exhibits appropriate insight and judgment.  ____________________________________________    LABS (pertinent positives/negatives)  Labs Reviewed  COMPREHENSIVE METABOLIC PANEL - Abnormal; Notable for the following:       Result Value   Chloride 99 (*)    Glucose, Bld 154 (*)    Total Protein 8.6 (*)    Total Bilirubin <0.1 (*)    All other components within normal limits  URINALYSIS COMPLETEWITH MICROSCOPIC ([facility] ONLY) - Abnormal; Notable for the following:    Color, Urine YELLOW (*)    APPearance HAZY (*)    Leukocytes, UA 3+ (*)    Bacteria, UA RARE (*)    Squamous Epithelial / LPF 0-5 (*)    All other components within normal limits  LIPASE, BLOOD - Abnormal; Notable for the following:    Lipase 67 (*)    All other components within normal limits  CBC  POC OCCULT BLOOD, ED  TYPE AND SCREEN     ____________________________________________   EKG  I,  , attending physician, personally viewed and interpreted this EKG  EKG Time: 1546 Rate: 106 Rhythm: sinus tachycardia Axis: normal Intervals: qtc 470 QRS: narrow, q waves V1 ST changes: no st elevation Impression: abnormal ekg  ____________________________________________    RADIOLOGY  RUQ US IMPRESSION:  No acute abnormality noted.     CT abd/pel IMPRESSION:  Changes consistent with early pancreatitis involving the head and  uncinate process. No pancreatic pseudocyst or significant phlegmon  is noted at this time.    ____________________________________________   PROCEDURES  Procedures  ____________________________________________   INITIAL IMPRESSION / ASSESSMENT AND PLAN / ED COURSE  Pertinent labs & imaging results that were available during my care of the patient were reviewed by me and considered in my medical decision making (see chart for details).  Patient presented with abdominal pain. Work up consistent with pancreatitis. Gallbladder without any stones. Patient was treated with pain medication here. She was transitioned to oral pain medication and was able to tolerate PO intake. Patient felt comfortable going home with pain medication. Discussed clear liquid diet with the patient. Will give information on that as well as pancreatitis. Have patient follow up with PCP. Discussed return precautions.    ____________________________________________   FINAL CLINICAL IMPRESSION(S) / ED DIAGNOSES  Final diagnoses:  Epigastric pain  Acute pancreatitis, unspecified complication status, unspecified pancreatitis type     Note: This dictation was prepared with Dragon dictation. Any transcriptional errors that result from this process are unintentional     , MD 05/11/16 2307  

## 2016-05-11 NOTE — ED Notes (Signed)
Pt given gingerale for PO challenge 

## 2016-05-11 NOTE — Discharge Instructions (Signed)
As we discussed please stick to a clear liquid diet for the next 2 days, and then you can advance to a solid diet as tolerated. Please seek medical attention for any high fevers, chest pain, shortness of breath, change in behavior, persistent vomiting, bloody stool or any other new or concerning symptoms.

## 2016-05-11 NOTE — ED Triage Notes (Signed)
Pt comes into the ED via EMS from home c/o generalized abdominal pain that is causing N/V.  1 episode of emesis is the past 24 hours.  Denies any diarrhea at this time.  States this has been going on for 1 week.  Patient in NAD at this time with even and unlabored respirations. Patient states her stool was "black" today  And yesterday.  Denies the use of blood thinners or iron supplements.

## 2016-05-15 ENCOUNTER — Encounter: Payer: Self-pay | Admitting: Internal Medicine

## 2016-05-15 ENCOUNTER — Inpatient Hospital Stay
Admission: EM | Admit: 2016-05-15 | Discharge: 2016-05-16 | DRG: 440 | Disposition: A | Discharge status: Home / Self Care | Payer: Self-pay | Attending: Internal Medicine | Admitting: Internal Medicine

## 2016-05-15 DIAGNOSIS — E119 Type 2 diabetes mellitus without complications: Secondary | ICD-10-CM | POA: Diagnosis present

## 2016-05-15 DIAGNOSIS — Z79899 Other long term (current) drug therapy: Secondary | ICD-10-CM

## 2016-05-15 DIAGNOSIS — Z8249 Family history of ischemic heart disease and other diseases of the circulatory system: Secondary | ICD-10-CM

## 2016-05-15 DIAGNOSIS — Z794 Long term (current) use of insulin: Secondary | ICD-10-CM

## 2016-05-15 DIAGNOSIS — Z90710 Acquired absence of both cervix and uterus: Secondary | ICD-10-CM

## 2016-05-15 DIAGNOSIS — R109 Unspecified abdominal pain: Secondary | ICD-10-CM

## 2016-05-15 DIAGNOSIS — Z7984 Long term (current) use of oral hypoglycemic drugs: Secondary | ICD-10-CM

## 2016-05-15 DIAGNOSIS — I10 Essential (primary) hypertension: Secondary | ICD-10-CM | POA: Diagnosis present

## 2016-05-15 DIAGNOSIS — K859 Acute pancreatitis without necrosis or infection, unspecified: Principal | ICD-10-CM | POA: Diagnosis present

## 2016-05-15 DIAGNOSIS — F17210 Nicotine dependence, cigarettes, uncomplicated: Secondary | ICD-10-CM | POA: Diagnosis present

## 2016-05-15 LAB — COMPREHENSIVE METABOLIC PANEL
ALBUMIN: 4.2 g/dL (ref 3.5–5.0)
ALT: 44 U/L (ref 14–54)
ANION GAP: 11 (ref 5–15)
AST: 59 U/L — ABNORMAL HIGH (ref 15–41)
Alkaline Phosphatase: 117 U/L (ref 38–126)
BILIRUBIN TOTAL: 0.8 mg/dL (ref 0.3–1.2)
BUN: 8 mg/dL (ref 6–20)
CO2: 27 mmol/L (ref 22–32)
Calcium: 9.5 mg/dL (ref 8.9–10.3)
Chloride: 99 mmol/L — ABNORMAL LOW (ref 101–111)
Creatinine, Ser: 0.72 mg/dL (ref 0.44–1.00)
GFR calc non Af Amer: >60 mL/min (ref >60)
GLUCOSE: 157 mg/dL — AB (ref 65–99)
POTASSIUM: 3.6 mmol/L (ref 3.5–5.1)
SODIUM: 137 mmol/L (ref 135–145)
TOTAL PROTEIN: 8.7 g/dL — AB (ref 6.5–8.1)

## 2016-05-15 LAB — CBC
HEMATOCRIT: 41.5 % (ref 35.0–47.0)
HEMATOCRIT: 39.2 % (ref 35.0–47.0)
HEMOGLOBIN: 14.5 g/dL (ref 12.0–16.0)
Hemoglobin: 13.6 g/dL (ref 12.0–16.0)
MCH: 31.2 pg (ref 26.0–34.0)
MCH: 31 pg (ref 26.0–34.0)
MCHC: 34.9 g/dL (ref 32.0–36.0)
MCHC: 34.7 g/dL (ref 32.0–36.0)
MCV: 89.4 fL (ref 80.0–100.0)
MCV: 89.3 fL (ref 80.0–100.0)
Platelets: 336 10*3/uL (ref 150–440)
Platelets: 303 10*3/uL (ref 150–440)
RBC: 4.65 MIL/uL (ref 3.80–5.20)
RBC: 4.39 MIL/uL (ref 3.80–5.20)
RDW: 11.8 % (ref 11.5–14.5)
RDW: 11.7 % (ref 11.5–14.5)
WBC: 9.8 10*3/uL (ref 3.6–11.0)
WBC: 8.4 10*3/uL (ref 3.6–11.0)

## 2016-05-15 LAB — URINALYSIS COMPLETE WITH MICROSCOPIC (ARMC ONLY)
BILIRUBIN URINE: NEGATIVE
Glucose, UA: NEGATIVE mg/dL
Hgb urine dipstick: NEGATIVE
KETONES UR: NEGATIVE mg/dL
NITRITE: NEGATIVE
PH: 6 (ref 5.0–8.0)
PROTEIN: NEGATIVE mg/dL
SPECIFIC GRAVITY, URINE: 1.02 (ref 1.005–1.030)

## 2016-05-15 LAB — URINE DRUG SCREEN, QUALITATIVE (ARMC ONLY)
AMPHETAMINES, UR SCREEN: NOT DETECTED
BARBITURATES, UR SCREEN: NOT DETECTED
Benzodiazepine, Ur Scrn: NOT DETECTED
COCAINE METABOLITE, UR ~~LOC~~: POSITIVE — AB
Cannabinoid 50 Ng, Ur ~~LOC~~: POSITIVE — AB
MDMA (Ecstasy)Ur Screen: NOT DETECTED
METHADONE SCREEN, URINE: NOT DETECTED
OPIATE, UR SCREEN: POSITIVE — AB
PHENCYCLIDINE (PCP) UR S: NOT DETECTED
Tricyclic, Ur Screen: NOT DETECTED

## 2016-05-15 LAB — GLUCOSE, CAPILLARY: Glucose-Capillary: 147 mg/dL — ABNORMAL HIGH (ref 65–99)

## 2016-05-15 LAB — TROPONIN I

## 2016-05-15 LAB — CREATININE, SERUM
CREATININE: 0.74 mg/dL (ref 0.44–1.00)
GFR calc Af Amer: >60 mL/min (ref >60)

## 2016-05-15 LAB — ETHANOL: Alcohol, Ethyl (B): <5 mg/dL (ref <5)

## 2016-05-15 LAB — LIPASE, BLOOD: Lipase: 197 U/L — ABNORMAL HIGH (ref 11–51)

## 2016-05-15 MED ORDER — ACETAMINOPHEN 325 MG PO TABS: 650.0000 mg | ORAL_TABLET | Freq: Four times a day (QID) | ORAL | Status: DC | PRN

## 2016-05-15 MED ORDER — MORPHINE SULFATE (PF) 2 MG/ML IV SOLN
2.0000 mg | INTRAVENOUS | Status: DC | PRN
  Administered 2016-05-16: 2 mg via INTRAVENOUS
  Filled 2016-05-15: qty 1

## 2016-05-15 MED ORDER — ENOXAPARIN SODIUM 40 MG/0.4ML ~~LOC~~ SOLN
40.0000 mg | SUBCUTANEOUS | Status: DC
  Administered 2016-05-15: 40 mg via SUBCUTANEOUS
  Filled 2016-05-15: qty 0.4

## 2016-05-15 MED ORDER — ONDANSETRON HCL 4 MG/2ML IJ SOLN
4.0000 mg | INTRAMUSCULAR | Status: AC
  Administered 2016-05-15: 4 mg via INTRAVENOUS
  Filled 2016-05-15: qty 2

## 2016-05-15 MED ORDER — FENTANYL CITRATE (PF) 100 MCG/2ML IJ SOLN
50.0000 ug | INTRAMUSCULAR | Status: DC | PRN
  Administered 2016-05-15: 50 ug via INTRAVENOUS
  Filled 2016-05-15: qty 2

## 2016-05-15 MED ORDER — ONDANSETRON HCL 4 MG/2ML IJ SOLN: 4.0000 mg | Freq: Four times a day (QID) | INTRAMUSCULAR | Status: DC | PRN

## 2016-05-15 MED ORDER — ONDANSETRON HCL 4 MG PO TABS: 4.0000 mg | ORAL_TABLET | Freq: Four times a day (QID) | ORAL | Status: DC | PRN

## 2016-05-15 MED ORDER — SODIUM CHLORIDE 0.9 % IV BOLUS (SEPSIS)
1000.0000 mL | INTRAVENOUS | Status: AC
  Administered 2016-05-15: 1000 mL via INTRAVENOUS

## 2016-05-15 MED ORDER — INSULIN ASPART 100 UNIT/ML ~~LOC~~ SOLN
0.0000 [IU] | Freq: Three times a day (TID) | SUBCUTANEOUS | Status: DC
  Administered 2016-05-15 – 2016-05-16 (×2): 1 [IU] via SUBCUTANEOUS
  Filled 2016-05-15 (×2): qty 1

## 2016-05-15 MED ORDER — ONDANSETRON HCL 4 MG/2ML IJ SOLN
4.0000 mg | Freq: Once | INTRAMUSCULAR | Status: AC | PRN
  Administered 2016-05-15: 4 mg via INTRAVENOUS
  Filled 2016-05-15: qty 2

## 2016-05-15 MED ORDER — HYDROCODONE-ACETAMINOPHEN 5-325 MG PO TABS
1.0000 | ORAL_TABLET | ORAL | Status: DC | PRN
  Administered 2016-05-15 – 2016-05-16 (×4): 2 via ORAL
  Filled 2016-05-15 (×4): qty 2

## 2016-05-15 MED ORDER — HYDROMORPHONE HCL 1 MG/ML IJ SOLN
1.0000 mg | INTRAMUSCULAR | Status: AC
  Administered 2016-05-15: 1 mg via INTRAVENOUS
  Filled 2016-05-15: qty 1

## 2016-05-15 MED ORDER — NICOTINE 21 MG/24HR TD PT24
21.0000 mg | MEDICATED_PATCH | Freq: Every day | TRANSDERMAL | Status: DC
  Administered 2016-05-15 – 2016-05-16 (×2): 21 mg via TRANSDERMAL
  Filled 2016-05-15 (×2): qty 1

## 2016-05-15 MED ORDER — HYDRALAZINE HCL 20 MG/ML IJ SOLN: 10.0000 mg | Freq: Four times a day (QID) | INTRAMUSCULAR | Status: DC | PRN

## 2016-05-15 MED ORDER — SODIUM CHLORIDE 0.9 % IV SOLN
INTRAVENOUS | Status: DC
  Administered 2016-05-15 – 2016-05-16 (×4): via INTRAVENOUS

## 2016-05-15 MED ORDER — ACETAMINOPHEN 650 MG RE SUPP: 650.0000 mg | Freq: Four times a day (QID) | RECTAL | Status: DC | PRN

## 2016-05-15 NOTE — ED Triage Notes (Signed)
BIB EMS from home. Pt reports epigastric pain since that had gotten worse since she was last seen on 10/31. Denies vomiting. Unknown fever Reports multiple episodes of diarrhea.
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
## 2016-05-15 NOTE — H&P (Addendum)
[place] [facility] [place] NAME: Haasini Patnaude    MR#:  [id number]  DATE OF BIRTH:  September 13, 1960  DATE OF ADMISSION:  05/15/2016  PRIMARY CARE PHYSICIAN: [facility]   REQUESTING/REFERRING PHYSICIAN: Enid Skeens MD  CHIEF COMPLAINT:   Chief Complaint  Patient presents with  . Abdominal Pain    HISTORY OF PRESENT ILLNESS: Lilyanne Mcquown  is a 55 y.o. female with a known history of Diabetes type 2, hypertension, unspecified mental disorder who is presenting with abdominal pain ongoing for 2 weeks in duration. Patient reports that she started having abdominal pain 2 weeks ago and was seen 5 days ago in the ED at that time was diagnosed with early pancreatitis. CT scan showed early pancreatitis. She was given some pain medications and discharged home. She returns back with worsening abdominal pain in the epigastric region described it as a sharp type pain. She also complains of pain in the substernal region with burning type of sensation she complains of nausea and vomiting.  PAST MEDICAL HISTORY:   Past Medical History:  Diagnosis Date  . Diabetes mellitus without complication ([place])   . Hypertension   . Mental disorder    pt reports 'I have all of them mental disorders'    PAST SURGICAL HISTORY:  Past Surgical History:  Procedure Laterality Date  . ABDOMINAL HYSTERECTOMY      SOCIAL HISTORY:  Social History  Substance Use Topics  . Smoking status: Current Every Day Smoker    Packs/day: 1.00    Types: Cigarettes  . Smokeless tobacco: Never Used  . Alcohol use No     Comment: every once in a blue moon    FAMILY HISTORY:  Family History  Problem Relation Age of Onset  . Hypertension Mother     DRUG ALLERGIES: No Known Allergies  REVIEW OF SYSTEMS:   CONSTITUTIONAL: No fever, fatigue or weakness.  EYES: No blurred or double vision.  EARS, NOSE, AND THROAT: No tinnitus or ear pain.  RESPIRATORY: No cough,  shortness of breath, wheezing or hemoptysis.  CARDIOVASCULAR: No chest pain, orthopnea, edema.  GASTROINTESTINAL: Positive nausea, positive vomiting, no diarrhea positive abdominal pain.  GENITOURINARY: No dysuria, hematuria.  ENDOCRINE: No polyuria, nocturia,  HEMATOLOGY: No anemia, easy bruising or bleeding SKIN: No rash or lesion. MUSCULOSKELETAL: No joint pain or arthritis.   NEUROLOGIC: No tingling, numbness, weakness.  PSYCHIATRY: No anxiety or depression.   MEDICATIONS AT HOME:  Prior to Admission medications   Medication Sig Start Date End Date Taking? Authorizing Provider  insulin NPH-regular Human (NOVOLIN 70/30) (70-30) 100 UNIT/ML injection Inject 15 Units into the skin 2 (two) times daily with a meal. Patient taking differently: Inject 20 Units into the skin 2 (two) times daily with a meal.  01/14/16  Yes Orson Eva, MD  lisinopril (PRINIVIL,ZESTRIL) 5 MG tablet Take 1 tablet (5 mg total) by mouth daily. 01/09/16  Yes Fritzi Mandes, MD  metFORMIN (GLUCOPHAGE) 500 MG tablet Take 1 tablet (500 mg total) by mouth 2 (two) times daily with a meal. 01/14/16  Yes Orson Eva, MD  ondansetron (ZOFRAN) 4 MG tablet Take 1 tablet (4 mg total) by mouth every 8 (eight) hours as needed for nausea or vomiting. 05/11/16  Yes Nance Pear, MD  blood glucose meter kit and supplies KIT Dispense based on patient and insurance preference. Use up to four times daily as directed. (FOR ICD-9 250.00, 250.01). 01/05/16   Joanne Gavel, MD  oxyCODONE-acetaminophen (ROXICET) 5-325 MG tablet Take 1 tablet by mouth every 4 (four) hours as needed for severe pain. Patient not taking: Reported on 05/15/2016 05/11/16   Nance Pear, MD      PHYSICAL EXAMINATION:   VITAL SIGNS: Blood pressure (!) 175/104, pulse 94, temperature 98.5 F (36.9 C), temperature source Oral, resp. rate 16, height '5\' 3"'  (1.6 m), weight 222 lb (100.7 kg), SpO2 95 %.  GENERAL:  55 y.o.-year-old patient lying in the bed with no acute  distress.  EYES: Pupils equal, round, reactive to light and accommodation. No scleral icterus. Extraocular muscles intact.  HEENT: Head atraumatic, normocephalic. Oropharynx and nasopharynx clear.  NECK:  Supple, no jugular venous distention. No thyroid enlargement, no tenderness.  LUNGS: Normal breath sounds bilaterally, no wheezing, rales,rhonchi or crepitation. No use of accessory muscles of respiration.  CARDIOVASCULAR: S1, S2 normal. No murmurs, rubs, or gallops.  ABDOMEN: Soft, Positive epigastric tenderness, nondistended. Bowel sounds present. No organomegaly or mass.  EXTREMITIES: No pedal edema, cyanosis, or clubbing.  NEUROLOGIC: Cranial nerves II through XII are intact. Muscle strength 5/5 in all extremities. Sensation intact. Gait not checked.  PSYCHIATRIC: The patient is alert and oriented x 3.  SKIN: No obvious rash, lesion, or ulcer.   LABORATORY PANEL:   CBC  Recent Labs Lab 05/11/16 1536 05/15/16 1436  WBC 10.1 9.8  HGB 14.8 14.5  HCT 42.8 41.5  PLT 315 336  MCV 89.5 89.4  MCH 30.9 31.2  MCHC 34.5 34.9  RDW 11.9 11.8   ------------------------------------------------------------------------------------------------------------------  Chemistries   Recent Labs Lab 05/11/16 1536 05/15/16 1436  NA 135 137  K 3.5 3.6  CL 99* 99*  CO2 25 27  GLUCOSE 154* 157*  BUN 15 8  CREATININE 0.83 0.72  CALCIUM 9.4 9.5  AST 15 59*  ALT 16 44  ALKPHOS 76 117  BILITOT <0.1* 0.8   ------------------------------------------------------------------------------------------------------------------ estimated creatinine clearance is 91 mL/min (by C-G formula based on SCr of 0.72 mg/dL). ------------------------------------------------------------------------------------------------------------------ No results for input(s): TSH, T4TOTAL, T3FREE, THYROIDAB in the last 72 hours.  Invalid input(s): FREET3   Coagulation profile No results for input(s): INR, PROTIME in the  last 168 hours. ------------------------------------------------------------------------------------------------------------------- No results for input(s): DDIMER in the last 72 hours. -------------------------------------------------------------------------------------------------------------------  Cardiac Enzymes No results for input(s): CKMB, TROPONINI, MYOGLOBIN in the last 168 hours.  Invalid input(s): CK ------------------------------------------------------------------------------------------------------------------ Invalid input(s): POCBNP  ---------------------------------------------------------------------------------------------------------------  Urinalysis    Component Value Date/Time   COLORURINE YELLOW (A) 05/11/2016 1914   APPEARANCEUR HAZY (A) 05/11/2016 1914   LABSPEC 1.010 05/11/2016 1914   PHURINE 6.0 05/11/2016 1914   GLUCOSEU NEGATIVE 05/11/2016 1914   HGBUR NEGATIVE 05/11/2016 1914   BILIRUBINUR NEGATIVE 05/11/2016 1914   KETONESUR NEGATIVE 05/11/2016 1914   PROTEINUR NEGATIVE 05/11/2016 1914   NITRITE NEGATIVE 05/11/2016 1914   LEUKOCYTESUR 3+ (A) 05/11/2016 1914     RADIOLOGY: No results found.  EKG: Orders placed or performed during the hospital encounter of 05/15/16  . EKG 12-Lead  . EKG 12-Lead  . ED EKG  . ED EKG    IMPRESSION AND PLAN: Patient is a 55 year old presenting with epigastric pain  1. Acute pancreatitis I will keep patient nothing by mouth and control her pain, continue IV fluids Check a fasting lipid panel in the morning Patient had ultrasound of the abdomen few days ago showed no evidence of gallstones and I will not repeat No history of alcohol abuse Recent CT without evidence of gallstones  2. Diabetes type 2 I  will hold her insulin. Place her on sliding scale insulin  3. Essential hypertension I will hold her lisinopril place her on when necessary IV hydralazine  4. Nicotine addiction Recommend that she stop  smoking I will start patient on nicotine replacement    All the records are reviewed and case discussed with ED provider. Management plans discussed with the patient, family and they are in agreement.  CODE STATUS: Code Status History    Date Active Date Inactive Code Status Order ID Comments User Context   01/13/2016 12:51 AM 01/14/2016  1:14 PM Full Code [id number]  Norval Morton, MD ED   01/08/2016  7:16 PM 01/09/2016  5:04 PM Full Code [id number]  Theodoro Grist, MD Inpatient       TOTAL TIME TAKING CARE OF THIS PATIENT: 55 minutes.    Dustin Flock M.D on 05/15/2016 at 4:30 PM  Between 7am to 6pm - Pager - [phone]  After 6pm go to [email or web address] - password [email or web address] [facility]  [place] [facility]  Office  [phone]  CC: Primary care physician; [place]

## 2016-05-15 NOTE — ED Provider Notes (Signed)
[facility] Emergency Department Provider Note  ____________________________________________   First MD Initiated Contact with Patient 05/15/16 1552     (approximate)  I have reviewed the triage vital signs and the nursing notes.   HISTORY  Chief Complaint Abdominal Pain  The patient's relative provide a history is limited by her severe epigastric pain and inability to cooperate with the exam and history  HPI Pamela Lane is a 55 y.o. female who presents to the emergency department with severe epigastric abdominal pain.  She reports that the pain started in a much more mild fashion about 2 weeks ago and she was seen 5 days ago in the emergency department and diagnosed with early pancreatitis.  Her lipase is very slightly elevated and her CT scan was indicative of early pancreatitis.  She was feeling better after pain medicine and Dr. Archie Balboa discussed with her the plan and she preferred to go home and be treated as an outpatient.  Today she reports that she is much worse and that she has been getting worse and she left the emergency department several days ago.  She states that she has several episodes of vomiting a day and most recently vomited in triage.  Her abdominal pain is sharp, stabbing, constant, severe, and located in her upper abdomen.  Does not radiate.  She denies fever/chills, chest pain, shortness of breath, dysuria.  Nothing makes it better and movement makes it worse as does eating.  She has not been able to eat or drink much recently due to the pain.   Past Medical History:  Diagnosis Date  . Diabetes mellitus without complication ([place])   . Hypertension   . Mental disorder    pt reports 'I have all of them mental disorders'    Patient Active Problem List   Diagnosis Date Noted  . AKI (acute kidney injury) ([place])   . Diabetic ketoacidosis without coma associated with type 2 diabetes mellitus ([place])   . Hyperglycemia due to type 2  diabetes mellitus ([place]) 01/13/2016  . UTI (urinary tract infection) 01/13/2016  . DKA (diabetic ketoacidoses) ([place]) 01/08/2016  . Hyponatremia 01/08/2016  . Dehydration 01/08/2016  . Urinary tract infection 01/08/2016  . Upper abdominal pain 01/08/2016  . Nausea & vomiting 01/08/2016    Past Surgical History:  Procedure Laterality Date  . ABDOMINAL HYSTERECTOMY      Prior to Admission medications   Medication Sig Start Date End Date Taking? Authorizing Provider  blood glucose meter kit and supplies KIT Dispense based on patient and insurance preference. Use up to four times daily as directed. (FOR ICD-9 250.00, 250.01). 01/05/16   Joanne Gavel, MD  insulin NPH-regular Human (NOVOLIN 70/30) (70-30) 100 UNIT/ML injection Inject 15 Units into the skin 2 (two) times daily with a meal. 01/14/16   Orson Eva, MD  lisinopril (PRINIVIL,ZESTRIL) 5 MG tablet Take 1 tablet (5 mg total) by mouth daily. 01/09/16   Fritzi Mandes, MD  metFORMIN (GLUCOPHAGE) 500 MG tablet Take 1 tablet (500 mg total) by mouth 2 (two) times daily with a meal. 01/14/16   Orson Eva, MD  ondansetron (ZOFRAN) 4 MG tablet Take 1 tablet (4 mg total) by mouth every 8 (eight) hours as needed for nausea or vomiting. 05/11/16   Nance Pear, MD  oxyCODONE-acetaminophen (ROXICET) 5-325 MG tablet Take 1 tablet by mouth every 4 (four) hours as needed for severe pain. 05/11/16   Nance Pear, MD    Allergies Review of patient's allergies indicates  no known allergies.  No family history on file.  Social History Social History  Substance Use Topics  . Smoking status: Current Every Day Smoker    Packs/day: 1.00    Types: Cigarettes  . Smokeless tobacco: Never Used  . Alcohol use No     Comment: every once in a blue moon    Review of Systems Constitutional: No fever/chills Eyes: No visual changes. ENT: No sore throat. Cardiovascular: Denies chest pain. Respiratory: Denies shortness of breath. Gastrointestinal: Severe upper  abdominal pain with nausea and vomiting.  No diarrhea.  No constipation. Genitourinary: Negative for dysuria. Musculoskeletal: Negative for back pain. Skin: Negative for rash. Neurological: Negative for headaches, focal weakness or numbness.  10-point ROS otherwise negative.  ____________________________________________   PHYSICAL EXAM:  VITAL SIGNS: ED Triage Vitals  Enc Vitals Group     BP 05/15/16 1426 (!) 152/92     Pulse Rate 05/15/16 1426 (!) 115     Resp 05/15/16 1426 18     Temp 05/15/16 1426 98.5 F (36.9 C)     Temp Source 05/15/16 1426 Oral     SpO2 05/15/16 1426 100 %     Weight 05/15/16 1425 222 lb (100.7 kg)     Height 05/15/16 1425 _0  (1.6 m)     Head Circumference --      Peak Flow --      Pain Score 05/15/16 1426 10     Pain Loc --      Pain Edu? --      Excl. in [place]? --     Constitutional: Alert and oriented. Well appearing and in no acute distress. Eyes: Conjunctivae are normal. PERRL. EOMI. Head: Atraumatic. Nose: No congestion/rhinnorhea. Mouth/Throat: Mucous membranes are moist.  Oropharynx non-erythematous. Neck: No stridor.  No meningeal signs.   Cardiovascular: Normal rate, regular rhythm. Good peripheral circulation. Grossly normal heart sounds. Respiratory: Normal respiratory effort.  No retractions. Lungs CTAB. Gastrointestinal: Obese.   Soft with diffuse abdominal tenderness throughout her abdomen but with severe tenderness to palpation of the upper abdomen. Musculoskeletal: No lower extremity tenderness nor edema. No gross deformities of extremities. Neurologic:  Normal speech and language. No gross focal neurologic deficits are appreciated.  Skin:  Skin is warm, dry and intact. No rash noted.  ____________________________________________   LABS (all labs ordered are listed, but only abnormal results are displayed)  Labs Reviewed  LIPASE, BLOOD - Abnormal; Notable for the following:       Result Value   Lipase 197 (*)    All other  components within normal limits  COMPREHENSIVE METABOLIC PANEL - Abnormal; Notable for the following:    Chloride 99 (*)    Glucose, Bld 157 (*)    Total Protein 8.7 (*)    AST 59 (*)    All other components within normal limits  URINE CULTURE  CBC  URINALYSIS COMPLETEWITH MICROSCOPIC ([facility] ONLY)  ETHANOL  URINE DRUG SCREEN, QUALITATIVE ([facility] ONLY)   ____________________________________________  EKG  ED ECG REPORT I, Alanea Woolridge, the attending physician, personally viewed and interpreted this ECG.  Date: 05/15/2016 EKG Time: 2:28 PM Rate: 115 Rhythm: Sinus tachycardia QRS Axis: normal Intervals: normal ST/T Wave abnormalities: normal Conduction Disturbances: none Narrative Interpretation: unremarkable  ____________________________________________  RADIOLOGY   No results found.  ____________________________________________   PROCEDURES  Procedure(s) performed:   Procedures   Critical Care performed: No ____________________________________________   INITIAL IMPRESSION / ASSESSMENT AND PLAN / ED COURSE  Pertinent labs & imaging results  that were available during my care of the patient were reviewed by me and considered in my medical decision making (see chart for details).  Patient is in severe distress, writhing and moaning, with recent vomiting and an increased lipase compared to several days ago.  I reviewed the results of the CT scan before which showed early pancreatitis and gallbladder as that was within normal limits.  There is no indication for ultrasound at this point.  She denies any alcohol use.  I do not have a solid cause for pancreatitis at this time but will treat her pain and nausea and admit her for further management.  Urinalysis was strongly positive several days ago.  A repeat test today is still pending.  ____________________________________________  FINAL CLINICAL IMPRESSION(S) / ED DIAGNOSES  Final diagnoses:  Acute pancreatitis  without infection or necrosis, unspecified pancreatitis type     MEDICATIONS GIVEN DURING THIS VISIT:  Medications  fentaNYL (SUBLIMAZE) injection 50 mcg (50 mcg Intravenous Given 05/15/16 1444)  HYDROmorphone (DILAUDID) injection 1 mg   ondansetron (ZOFRAN) injection 4 mg   sodium chloride 0.9 % bolus 1,000 mL   ondansetron (ZOFRAN) injection 4 mg (4 mg Intravenous Given 05/15/16 1444)     NEW OUTPATIENT MEDICATIONS STARTED DURING THIS VISIT:  New Prescriptions   No medications on file    Modified Medications   No medications on file    Discontinued Medications   No medications on file     Note:  This document was prepared using Dragon voice recognition software and may include unintentional dictation errors.    Hinda Kehr, MD 05/15/16 [phone]

## 2016-05-15 NOTE — ED Notes (Signed)
Report to floor, pt to floor with tech.

## 2016-05-15 NOTE — ED Notes (Signed)
States cannot provide urine spec at this time.

## 2016-05-16 LAB — CBC
HCT: 38.6 % (ref 35.0–47.0)
Hemoglobin: 13.5 g/dL (ref 12.0–16.0)
MCH: 31.5 pg (ref 26.0–34.0)
MCHC: 34.9 g/dL (ref 32.0–36.0)
MCV: 90.3 fL (ref 80.0–100.0)
Platelets: 276 10*3/uL (ref 150–440)
RBC: 4.28 MIL/uL (ref 3.80–5.20)
RDW: 12 % (ref 11.5–14.5)
WBC: 6.4 10*3/uL (ref 3.6–11.0)

## 2016-05-16 LAB — BASIC METABOLIC PANEL
Anion gap: 5 (ref 5–15)
BUN: 6 mg/dL (ref 6–20)
CO2: 27 mmol/L (ref 22–32)
Calcium: 8.3 mg/dL — ABNORMAL LOW (ref 8.9–10.3)
Chloride: 105 mmol/L (ref 101–111)
Creatinine, Ser: 0.78 mg/dL (ref 0.44–1.00)
GFR calc Af Amer: >60 mL/min (ref >60)
GFR calc non Af Amer: >60 mL/min (ref >60)
Glucose, Bld: 122 mg/dL — ABNORMAL HIGH (ref 65–99)
Potassium: 3.6 mmol/L (ref 3.5–5.1)
Sodium: 137 mmol/L (ref 135–145)

## 2016-05-16 LAB — LIPID PANEL
Cholesterol: 153 mg/dL (ref 0–200)
HDL: 38 mg/dL — ABNORMAL LOW (ref >40)
LDL Cholesterol: 92 mg/dL (ref 0–99)
Total CHOL/HDL Ratio: 4 RATIO
Triglycerides: 117 mg/dL (ref <150)
VLDL: 23 mg/dL (ref 0–40)

## 2016-05-16 LAB — GLUCOSE, CAPILLARY
GLUCOSE-CAPILLARY: 117 mg/dL — AB (ref 65–99)
Glucose-Capillary: 121 mg/dL — ABNORMAL HIGH (ref 65–99)
Glucose-Capillary: 130 mg/dL — ABNORMAL HIGH (ref 65–99)
Glucose-Capillary: 130 mg/dL — ABNORMAL HIGH (ref 65–99)

## 2016-05-16 LAB — TROPONIN I
Troponin I: <0.03 ng/mL (ref <0.03)
Troponin I: <0.03 ng/mL (ref <0.03)

## 2016-05-16 MED ORDER — CEFTRIAXONE SODIUM-DEXTROSE 1-3.74 GM-% IV SOLR
1.0000 g | Freq: Every day | INTRAVENOUS | Status: DC
  Administered 2016-05-16: 1 g via INTRAVENOUS
  Filled 2016-05-16: qty 50

## 2016-05-16 MED ORDER — DEXTROSE 5 % IV SOLN: 1.0000 g | Freq: Once | INTRAVENOUS | Status: DC

## 2016-05-16 MED ORDER — DEXTROSE 5 % IV SOLN: 1.0000 g | INTRAVENOUS | Status: DC

## 2016-05-16 MED ORDER — INSULIN NPH ISOPHANE & REGULAR (70-30) 100 UNIT/ML ~~LOC~~ SUSP: 20.0000 [IU] | Freq: Two times a day (BID) | SUBCUTANEOUS | 0 refills | Status: DC

## 2016-05-16 MED ORDER — CEFUROXIME AXETIL 500 MG PO TABS: 500.0000 mg | ORAL_TABLET | Freq: Two times a day (BID) | ORAL | 0 refills | Status: DC

## 2016-05-16 NOTE — Progress Notes (Signed)
Pharmacy Antibiotic Note  Laurey MoraleMary G Savage is a 55 y.o. female admitted on 05/15/2016 with UTI.  Pharmacy has been consulted for ceftriaxone dosing.  Plan: Ordered Ceftriaxone 1g IV every 24 hours.  Height: 5\' 3"  (160 cm) Weight: 222 lb (100.7 kg) IBW/kg (Calculated) : 52.4  Temp (24hrs), Avg:98 F (36.7 C), Min:97.7 F (36.5 C), Max:98.5 F (36.9 C)   Recent Labs Lab 05/11/16 1536 05/15/16 1436 05/15/16 1753 05/16/16 0512  WBC 10.1 9.8 8.4 6.4  CREATININE 0.83 0.72 0.74 0.78    Estimated Creatinine Clearance: 91 mL/min (by C-G formula based on SCr of 0.78 mg/dL).    No Known Allergies  Antimicrobials this admission: Ceftriaxone  11/5 >>   Dose adjustments this admission: N/A  Microbiology results: 11/5 UCx: pending   Thank you for allowing pharmacy to be a part of this patient's care.  Stormy CardKatsoudas,Rayetta Veith K, RPh Clinical Pharmacist 05/16/2016 7:17 AM

## 2016-05-16 NOTE — Progress Notes (Signed)
Educated patient about being a moderate plus fall risk and what that entailed and she still refused the bed alarm.

## 2016-05-16 NOTE — Discharge Summary (Signed)
Pamela Lane at [place] NAME: Pamela Lane    MR#:  [id number]  DATE OF BIRTH:  10/30/1960  DATE OF ADMISSION:  05/15/2016 ADMITTING PHYSICIAN: Dustin Flock, MD  DATE OF DISCHARGE: 05/16/2016  PRIMARY CARE PHYSICIAN: [facility]    ADMISSION DIAGNOSIS:  Abdominal pain [R10.9] Acute pancreatitis without infection or necrosis, unspecified pancreatitis type [K85.90]  DISCHARGE DIAGNOSIS:  Active Problems:   Acute pancreatitis   SECONDARY DIAGNOSIS:   Past Medical History:  Diagnosis Date  . Diabetes mellitus without complication ([place])   . Hypertension   . Mental disorder    pt reports 'I have all of them mental disorders'    HOSPITAL COURSE:   55 year old female with diabetes who presents with acute pancreatitis.  1. Acute pancreatitis of unclear etiology: Patient was treated IV fluids, nothing by mouth and pain medications as well as antiemetics. She has no abdominal pain and tolerated her diet. She had a CT scan on October 31 when she was first diagnosed with mild pancreatitis which showed no evidence of gallstones. She does not drink EtOH.   2. Diabetes: Patient may resume outpatient medications and ADA diet 3. Essential hypertension: Patient may continue outpatient medications including lisinopril.  DISCHARGE CONDITIONS AND DIET:   Stable for discharge on diabetic diet  CONSULTS OBTAINED:    DRUG ALLERGIES:  No Known Allergies  DISCHARGE MEDICATIONS:   Current Discharge Medication List    START taking these medications   Details  cefUROXime (CEFTIN) 500 MG tablet Take 1 tablet (500 mg total) by mouth 2 (two) times daily with a meal. Qty: 12 tablet, Refills: 0      CONTINUE these medications which have CHANGED   Details  insulin NPH-regular Human (NOVOLIN 70/30) (70-30) 100 UNIT/ML injection Inject 20 Units into the skin 2 (two) times daily with a meal. Qty: 8.8 mL, Refills: 0      CONTINUE these  medications which have NOT CHANGED   Details  lisinopril (PRINIVIL,ZESTRIL) 5 MG tablet Take 1 tablet (5 mg total) by mouth daily. Qty: 30 tablet, Refills: 1    metFORMIN (GLUCOPHAGE) 500 MG tablet Take 1 tablet (500 mg total) by mouth 2 (two) times daily with a meal. Qty: 60 tablet, Refills: 1    ondansetron (ZOFRAN) 4 MG tablet Take 1 tablet (4 mg total) by mouth every 8 (eight) hours as needed for nausea or vomiting. Qty: 20 tablet, Refills: 0    blood glucose meter kit and supplies KIT Dispense based on patient and insurance preference. Use up to four times daily as directed. (FOR ICD-9 250.00, 250.01). Qty: 1 each, Refills: 0    oxyCODONE-acetaminophen (ROXICET) 5-325 MG tablet Take 1 tablet by mouth every 4 (four) hours as needed for severe pain. Qty: 15 tablet, Refills: 0              Today   CHIEF COMPLAINT:   Patient is hungry and wants to eat complains of no abdominal pain, nausea or vomiting   VITAL SIGNS:  Blood pressure (!) 159/90, pulse 94, temperature 97.7 F (36.5 C), temperature source Oral, resp. rate 18, height '5\' 3"'  (1.6 m), weight 100.7 kg (222 lb), SpO2 97 %.   REVIEW OF SYSTEMS:  Review of Systems  Constitutional: Negative.  Negative for chills, fever and malaise/fatigue.  HENT: Negative.  Negative for ear discharge, ear pain, hearing loss, nosebleeds and sore throat.   Eyes: Negative.  Negative for blurred vision and pain.  Respiratory:  Negative.  Negative for cough, hemoptysis, shortness of breath and wheezing.   Cardiovascular: Negative.  Negative for chest pain, palpitations and leg swelling.  Gastrointestinal: Negative.  Negative for abdominal pain, blood in stool, diarrhea, nausea and vomiting.  Genitourinary: Negative.  Negative for dysuria.  Musculoskeletal: Negative.  Negative for back pain.  Skin: Negative.   Neurological: Negative for dizziness, tremors, speech change, focal weakness, seizures and headaches.  Endo/Heme/Allergies:  Negative.  Does not bruise/bleed easily.  Psychiatric/Behavioral: Negative.  Negative for depression, hallucinations and suicidal ideas.     PHYSICAL EXAMINATION:  GENERAL:  55 y.o.-year-old patient lying in the bed with no acute distress.  NECK:  Supple, no jugular venous distention. No thyroid enlargement, no tenderness.  LUNGS: Normal breath sounds bilaterally, no wheezing, rales,rhonchi  No use of accessory muscles of respiration.  CARDIOVASCULAR: S1, S2 normal. No murmurs, rubs, or gallops.  ABDOMEN: Soft, non-tender, non-distended. Bowel sounds present. No organomegaly or mass.  EXTREMITIES: No pedal edema, cyanosis, or clubbing.  PSYCHIATRIC: The patient is alert and oriented x 3.  SKIN: No obvious rash, lesion, or ulcer.   DATA REVIEW:   CBC  Recent Labs Lab 05/16/16 0512  WBC 6.4  HGB 13.5  HCT 38.6  PLT 276    Chemistries   Recent Labs Lab 05/15/16 1436  05/16/16 0512  NA 137  --  137  K 3.6  --  3.6  CL 99*  --  105  CO2 27  --  27  GLUCOSE 157*  --  122*  BUN 8  --  6  CREATININE 0.72  < > 0.78  CALCIUM 9.5  --  8.3*  AST 59*  --   --   ALT 44  --   --   ALKPHOS 117  --   --   BILITOT 0.8  --   --   < > = values in this interval not displayed.  Cardiac Enzymes  Recent Labs Lab 05/15/16 1753 05/16/16 0028 05/16/16 0839  TROPONINI <0.03 <0.03 <0.03    Microbiology Results  '@[email or web address]' @  RADIOLOGY:  No results found.    Management plans discussed with the patient and she is in agreement. Stable for discharge home  Patient should follow up with pcp  CODE STATUS:     Code Status Orders        Start     Ordered   05/15/16 1746  Full code  Continuous     05/15/16 1745    Code Status History    Date Active Date Inactive Code Status Order ID Comments User Context   01/13/2016 12:51 AM 01/14/2016  1:14 PM Full Code [id number]  Norval Morton, MD ED   01/08/2016  7:16 PM 01/09/2016  5:04 PM Full Code [id number]  Theodoro Grist, MD  Inpatient      TOTAL TIME TAKING CARE OF THIS PATIENT: 33 minutes.    Note: This dictation was prepared with Dragon dictation along with smaller phrase technology. Any transcriptional errors that result from this process are unintentional.  Vonzell Lindblad M.D on 05/16/2016 at 11:29 AM  Between 7am to 6pm - Pager - [phone] After 6pm go to [email or web address] - password [email or web address] [facility]  Office  [phone]  CC: Primary care physician; [place]

## 2016-05-17 LAB — HEMOGLOBIN A1C
Hgb A1c MFr Bld: 6.5 % — ABNORMAL HIGH (ref 4.8–5.6)
MEAN PLASMA GLUCOSE: 140 mg/dL

## 2016-05-17 LAB — URINE CULTURE: Special Requests: NORMAL

## 2017-04-24 ENCOUNTER — Emergency Department: Payer: Medicaid Other

## 2017-04-24 ENCOUNTER — Emergency Department
Admission: EM | Admit: 2017-04-24 | Discharge: 2017-04-24 | Disposition: A | Discharge status: Home / Self Care | Payer: Medicaid Other | Attending: Emergency Medicine | Admitting: Emergency Medicine

## 2017-04-24 ENCOUNTER — Encounter: Payer: Self-pay | Admitting: Emergency Medicine

## 2017-04-24 DIAGNOSIS — F17210 Nicotine dependence, cigarettes, uncomplicated: Secondary | ICD-10-CM | POA: Insufficient documentation

## 2017-04-24 DIAGNOSIS — I6523 Occlusion and stenosis of bilateral carotid arteries: Secondary | ICD-10-CM | POA: Diagnosis not present

## 2017-04-24 DIAGNOSIS — I10 Essential (primary) hypertension: Secondary | ICD-10-CM | POA: Insufficient documentation

## 2017-04-24 DIAGNOSIS — Z7982 Long term (current) use of aspirin: Secondary | ICD-10-CM | POA: Diagnosis not present

## 2017-04-24 DIAGNOSIS — J45909 Unspecified asthma, uncomplicated: Secondary | ICD-10-CM | POA: Insufficient documentation

## 2017-04-24 DIAGNOSIS — M542 Cervicalgia: Secondary | ICD-10-CM | POA: Diagnosis present

## 2017-04-24 DIAGNOSIS — E119 Type 2 diabetes mellitus without complications: Secondary | ICD-10-CM | POA: Insufficient documentation

## 2017-04-24 DIAGNOSIS — Q289 Congenital malformation of circulatory system, unspecified: Secondary | ICD-10-CM

## 2017-04-24 DIAGNOSIS — Q288 Other specified congenital malformations of circulatory system: Secondary | ICD-10-CM

## 2017-04-24 HISTORY — DX: Unspecified asthma, uncomplicated: J45.909

## 2017-04-24 LAB — CBC WITH DIFFERENTIAL/PLATELET
Basophils Absolute: 0.1 10*3/uL (ref 0–0.1)
Basophils Relative: 1 %
Eosinophils Absolute: 0.1 10*3/uL (ref 0–0.7)
Eosinophils Relative: 2 %
HEMATOCRIT: 44.7 % (ref 35.0–47.0)
Hemoglobin: 15.2 g/dL (ref 12.0–16.0)
Lymphocytes Relative: 19 %
Lymphs Abs: 1.5 10*3/uL (ref 1.0–3.6)
MCH: 30.7 pg (ref 26.0–34.0)
MCHC: 34.1 g/dL (ref 32.0–36.0)
MCV: 90 fL (ref 80.0–100.0)
MONO ABS: 0.4 10*3/uL (ref 0.2–0.9)
MONOS PCT: 5 %
NEUTROS ABS: 5.6 10*3/uL (ref 1.4–6.5)
Neutrophils Relative %: 73 %
Platelets: 265 10*3/uL (ref 150–440)
RBC: 4.96 MIL/uL (ref 3.80–5.20)
RDW: 12.5 % (ref 11.5–14.5)
WBC: 7.6 10*3/uL (ref 3.6–11.0)

## 2017-04-24 LAB — COMPREHENSIVE METABOLIC PANEL
ALBUMIN: 4.1 g/dL (ref 3.5–5.0)
ALT: 21 U/L (ref 14–54)
ANION GAP: 10 (ref 5–15)
AST: 19 U/L (ref 15–41)
Alkaline Phosphatase: 83 U/L (ref 38–126)
BILIRUBIN TOTAL: 0.4 mg/dL (ref 0.3–1.2)
BUN: 10 mg/dL (ref 6–20)
CO2: 27 mmol/L (ref 22–32)
Calcium: 9.4 mg/dL (ref 8.9–10.3)
Chloride: 101 mmol/L (ref 101–111)
Creatinine, Ser: 0.72 mg/dL (ref 0.44–1.00)
GFR calc Af Amer: >60 mL/min (ref >60)
Glucose, Bld: 131 mg/dL — ABNORMAL HIGH (ref 65–99)
POTASSIUM: 4 mmol/L (ref 3.5–5.1)
Sodium: 138 mmol/L (ref 135–145)
TOTAL PROTEIN: 7.9 g/dL (ref 6.5–8.1)

## 2017-04-24 LAB — TROPONIN I: Troponin I: <0.03 ng/mL (ref <0.03)

## 2017-04-24 MED ORDER — LISINOPRIL 10 MG PO TABS: 10.0000 mg | ORAL_TABLET | Freq: Every day | ORAL | 0 refills | Status: DC

## 2017-04-24 MED ORDER — CYCLOBENZAPRINE HCL 10 MG PO TABS
10.0000 mg | ORAL_TABLET | Freq: Once | ORAL | Status: AC
  Administered 2017-04-24: 10 mg via ORAL
  Filled 2017-04-24: qty 1

## 2017-04-24 MED ORDER — LISINOPRIL 10 MG PO TABS
10.0000 mg | ORAL_TABLET | Freq: Once | ORAL | Status: AC
  Administered 2017-04-24: 10 mg via ORAL
  Filled 2017-04-24: qty 1

## 2017-04-24 MED ORDER — IBUPROFEN 800 MG PO TABS
800.0000 mg | ORAL_TABLET | ORAL | Status: AC
  Administered 2017-04-24: 800 mg via ORAL
  Filled 2017-04-24: qty 1

## 2017-04-24 MED ORDER — IOPAMIDOL (ISOVUE-370) INJECTION 76%
75.0000 mL | Freq: Once | INTRAVENOUS | Status: AC | PRN
  Administered 2017-04-24: 75 mL via INTRAVENOUS

## 2017-04-24 NOTE — ED Notes (Signed)
Patient transported to CT 

## 2017-04-24 NOTE — Discharge Instructions (Signed)
Follow-up with Pamela Lane on Wednesday as planned. Setup a neurology clinic follow-up as available.  Call your doctor or return to the ED if you have a worsening headache, sudden and severe headache, confusion, slurred speech, facial droop, weakness or numbness in any arm or leg, extreme fatigue, vision problems, or other symptoms that concern you.

## 2017-04-24 NOTE — ED Provider Notes (Signed)
[facility] Emergency Department Provider Note   ____________________________________________   First MD Initiated Contact with Patient 04/24/17 1223     (approximate)  I have reviewed the triage vital signs and the nursing notes.   HISTORY  Chief Complaint neck pain   HPI Pamela Lane is a 56 y.o. female reports about 2 AM she was sitting in chair, when she began experiencing a rather abrupt pain in the right mid to lower neck that radiated somewhat towards the base of her skull. It persisted for several hours, she reports it is severe and was sharp and lancinating and about "10 out of 10". No injury. No weakness headache numbness or tingling with it. She reports it seems radiate slightly out towards her right shoulder, but this is gone away. Currently her pain has gotten better in the last hour and a half, she reports is currently has very mild.  No chest pain. No nausea or vomiting. Does report she's been out of her insulin but has been taking her metformin, in addition she is out of her lisinopril for about 3 weeks. She has no appointment on Wednesday with [facility] to get her medications refilledand see her primary doctor.   No trouble breathing. No moving pain. Reports neck pain was worse with movement especially turning the head to the side.   Past Medical History:  Diagnosis Date  . Asthma   . Diabetes mellitus without complication ([place])   . Hypertension   . Mental disorder    pt reports 'I have all of them mental disorders'    Patient Active Problem List   Diagnosis Date Noted  . Acute pancreatitis 05/15/2016  . AKI (acute kidney injury) ([place])   . Diabetic ketoacidosis without coma associated with type 2 diabetes mellitus ([place])   . Hyperglycemia due to type 2 diabetes mellitus ([place]) 01/13/2016  . UTI (urinary tract infection) 01/13/2016  . DKA (diabetic ketoacidoses) ([place]) 01/08/2016  . Hyponatremia 01/08/2016  . Dehydration  01/08/2016  . Urinary tract infection 01/08/2016  . Upper abdominal pain 01/08/2016  . Nausea & vomiting 01/08/2016    Past Surgical History:  Procedure Laterality Date  . ABDOMINAL HYSTERECTOMY      Prior to Admission medications   Medication Sig Start Date End Date Taking? Authorizing Provider  blood glucose meter kit and supplies KIT Dispense based on patient and insurance preference. Use up to four times daily as directed. (FOR ICD-9 250.00, 250.01). 01/05/16   Joanne Gavel, MD  insulin NPH-regular Human (NOVOLIN 70/30) (70-30) 100 UNIT/ML injection Inject 20 Units into the skin 2 (two) times daily with a meal. 05/16/16 06/07/16  Bettey Costa, MD  lisinopril (PRINIVIL) 10 MG tablet Take 1 tablet (10 mg total) by mouth daily. 04/24/17   Delman Kitten, MD  metFORMIN (GLUCOPHAGE) 500 MG tablet Take 1 tablet (500 mg total) by mouth 2 (two) times daily with a meal. 01/14/16   Tat, Shanon Brow, MD  ondansetron (ZOFRAN) 4 MG tablet Take 1 tablet (4 mg total) by mouth every 8 (eight) hours as needed for nausea or vomiting. 05/11/16   Nance Pear, MD    Allergies Patient has no known allergies.  Family History  Problem Relation Age of Onset  . Hypertension Mother     Social History Social History  Substance Use Topics  . Smoking status: Current Every Day Smoker    Packs/day: 1.00    Types: Cigarettes  . Smokeless tobacco: Never Used  . Alcohol use  No     Comment: every once in a blue moon    Review of Systems Constitutional: No fever/chills Eyes: No visual changes. ENT: No sore throat.see history of present illness regarding neck pain Cardiovascular: Denies chest pain. Respiratory: Denies shortness of breath. Gastrointestinal: No abdominal pain.  No nausea, no vomiting.  No diarrhea.  No constipation. Genitourinary: Negative for dysuria. Musculoskeletal: Negative for back pain. Skin: Negative for rash. Neurological: Negative for headaches, focal weakness or  numbness.    ____________________________________________   PHYSICAL EXAM:  VITAL SIGNS: ED Triage Vitals  Enc Vitals Group     BP 04/24/17 1151 (!) 187/97     Pulse Rate 04/24/17 1147 97     Resp 04/24/17 1147 16     Temp 04/24/17 1147 98.5 F (36.9 C)     Temp src --      SpO2 04/24/17 1147 98 %     Weight 04/24/17 1148 222 lb (100.7 kg)     Height 04/24/17 1148 _0  (1.6 m)     Head Circumference --      Peak Flow --      Pain Score 04/24/17 1147 9     Pain Loc --      Pain Edu? --      Excl. in [place]? --     Constitutional: Alert and oriented. Well appearing and in no acute distress. Eyes: Conjunctivae are normal. Head: Atraumatic. Nose: No congestion/rhinnorhea. Mouth/Throat: Mucous membranes are moist. Neck: No stridor.  moderate tenderness across the posterior right cervical paraspinous region. Trapezius muscle is tender to palpation. she reports pain is worsened by turning the head to right or left. No abnormal masses, erythema or redness or edema of the neck. Minimal tenderness across the trapezius muscles of the right upper chest/right shoulder region without deformity. Cardiovascular: Normal rate, regular rhythm. Grossly normal heart sounds.  Good peripheral circulation. Respiratory: Normal respiratory effort.  No retractions. Lungs CTAB. Gastrointestinal: Soft and nontender. No distention. Musculoskeletal: No lower extremity tenderness nor edema. Neurologic:  Normal speech and language. No gross focal neurologic deficits are appreciated.  Skin:  Skin is warm, dry and intact. No rash noted. Psychiatric: Mood and affect are normal. Speech and behavior are normal.  ____________________________________________   LABS (all labs ordered are listed, but only abnormal results are displayed)  Labs Reviewed  COMPREHENSIVE METABOLIC PANEL - Abnormal; Notable for the following:       Result Value   Glucose, Bld 131 (*)    All other components within normal limits   CBC WITH DIFFERENTIAL/PLATELET  TROPONIN I   ____________________________________________  EKG  ED ECG REPORT I, Kattaleya Alia, the attending physician, personally viewed and interpreted this ECG.  Date: 04/24/2017 EKG Time: 12:15 Rate: 87 Rhythm: normal sinus rhythmoccasional PAC QRS Axis: normal Intervals: normal ST/T Wave abnormalities: normal Narrative Interpretation: no evidence of acute ischemia  ____________________________________________  RADIOLOGY  Dg Chest 2 View  Result Date: 04/24/2017 CLINICAL DATA:  Chest pain, shortness of breath EXAM: CHEST  2 VIEW COMPARISON:  01/12/2016 FINDINGS: Lungs are clear.  No pleural effusion or pneumothorax. The heart is normal in size. Visualized osseous structures are within normal limits. IMPRESSION: Normal chest radiographs. Electronically Signed   By: Julian Hy M.D.   On: 04/24/2017 13:53   Ct Angio Neck W And/or Wo Contrast  Result Date: 04/24/2017 CLINICAL DATA:  Bilateral shoulder pain that goes up the neck. No known injury. Neck pain and headache began this morning. EXAM: CT ANGIOGRAPHY  NECK TECHNIQUE: Multidetector CT imaging of the neck was performed using the standard protocol during bolus administration of intravenous contrast. Multiplanar CT image reconstructions and MIPs were obtained to evaluate the vascular anatomy. Carotid stenosis measurements (when applicable) are obtained utilizing NASCET criteria, using the distal internal carotid diameter as the denominator. CONTRAST:  75 cc Isovue 370 intravenous COMPARISON:  None. FINDINGS: Aortic arch: Normal.  Three vessel branching Right carotid system: The vessels are smooth and widely patent. There could be mild non calcified atheromatous plaque at the ICA bulb. Left carotid system: The vessels are smooth and widely patent. There could be mild non calcified atheromatous plaque at the ICA bulb. Vertebral arteries: No right vertebral artery is seen until the V4 segment  where there is reconstitution via unknown vessel, possibly retrograde. This has a chronic appearance as there is no visible stump or atherosclerosis proximal to the thyrocervical trunk. A right transverse foramen is present and presumably contains vertebral veins. There is a large left vertebral artery that is widely patent to the dura. Small atherosclerotic plaque at the origin of the left subclavian artery. Skeleton: No acute or aggressive finding.  Cervical spondylosis. Other neck: Symmetric prominent enhancement of mucosa in the nasopharynx with adenoid tonsil thickening. This may reflect a tonsillitis. Upper chest: No acute finding. Airway thickening which is likely related patient's history of smoking. Subtle lucent spaces in the upper lungs. IMPRESSION: 1. Nonvisualized right vertebral artery until the V4 segment, favored chronic/developmental as there is no visible stump or atheromatous changes at the expected origin. Reportedly the patient's neck pain is bilateral. 2. The carotid and left vertebral arteries show no stenosis or dissection. 3. Thickened adenoid with prominent mucosal enhancement, please correlate for tonsillitis symptoms. 4. Bronchial thickening and heterogeneous aeration or mild emphysema in this smoker. Electronically Signed   By: Monte Fantasia M.D.   On: 04/24/2017 14:10     no acute findings noted on carotid CT.he did review with on-call neurologist Dr. Irish Elders the patient's clinical history as well as findings about the right vertebral artery, he reports no intervention to be performed and appears to be a congenital or chronic abnormalities well. He advises discharge of the patient ____________________________________________   PROCEDURES  Procedure(s) performed: None  Procedures  Critical Care performed: No  ____________________________________________   INITIAL IMPRESSION / ASSESSMENT AND PLAN / ED COURSE  Pertinent labs & imaging results that were available  during my care of the patient were reviewed by me and considered in my medical decision making (see chart for details).  Patient presents for evaluation of neck pain that started at 2 AM. Appears to be primarily right sided, likely musculoskeletal in nature by my clinical assessment exam.however, given the patient's concerns of what was earlier of severe right-sided neck pain certainly consideration for radicular pain, slipped disc, and carotid dissection are considered. No history of trauma. In addition, the patient has no associated neurologic symptoms, reassuring and normal neurologic exam of the head and neck arms and legs. There is unlikely that this represents an acute emergent etiology, more likely represents muscular skeletal. Nonetheless we'll proceed by evaluation with CT.  No associated cardiac or respiratory symptoms. EKG and troponin normal.     ----------------------------------------- 2:50 PM on 04/24/2017 -----------------------------------------  Workup reassuring. Patient sitting on the bed reports pain and symptoms are essentially gone now. Comfortable with plan for discharge and will provide prescription for her antihypertensive until she can be seen in the clinic Wednesday as scheduled. Return precautions and treatment  recommendations and follow-up discussed with the patient who is agreeable with the plan.  ____________________________________________   FINAL CLINICAL IMPRESSION(S) / ED DIAGNOSES  Final diagnoses:  Congenital anomaly of carotid circulation  Cervicalgia of occipito-atlanto-axial region      NEW MEDICATIONS STARTED DURING THIS VISIT:  New Prescriptions   LISINOPRIL (PRINIVIL) 10 MG TABLET    Take 1 tablet (10 mg total) by mouth daily.     Note:  This document was prepared using Dragon voice recognition software and may include unintentional dictation errors.     Delman Kitten, MD 04/24/17 1451

## 2017-04-24 NOTE — ED Triage Notes (Signed)
FIRST NURSE NOTE-pain to both shoulders, worse when moving and laying down.

## 2017-04-24 NOTE — ED Notes (Signed)
See triage note. Pt c/o bil shoulder pain radiating into neck. Pt states she slept on an uncomfortable couch last night because she is in the process of moving. Pain has lessened some since waking up.   Pt also states she is out for lisinopril, metformin, and insulin 70/30. Has an appt Wednesday at [facility] to get refills but needs coverage until then.

## 2017-04-24 NOTE — ED Triage Notes (Signed)
Pt states out of asthma and diabetes medication and sees her dr on weds.

## 2017-04-24 NOTE — ED Notes (Signed)

## 2017-04-24 NOTE — ED Triage Notes (Signed)
Pt states awoke with bilateral shoulder pain that goes up into the neck on both sides. Denies injury - states "i just laid around all day yesterday".

## 2017-09-23 ENCOUNTER — Inpatient Hospital Stay
Admission: EM | Admit: 2017-09-23 | Discharge: 2017-09-24 | DRG: 638 | Disposition: A | Discharge status: Home / Self Care | Payer: Medicaid Other | Attending: Internal Medicine | Admitting: Internal Medicine

## 2017-09-23 ENCOUNTER — Encounter: Payer: Self-pay | Admitting: Emergency Medicine

## 2017-09-23 ENCOUNTER — Other Ambulatory Visit: Payer: Self-pay

## 2017-09-23 DIAGNOSIS — J4520 Mild intermittent asthma, uncomplicated: Secondary | ICD-10-CM | POA: Diagnosis present

## 2017-09-23 DIAGNOSIS — R739 Hyperglycemia, unspecified: Secondary | ICD-10-CM | POA: Diagnosis not present

## 2017-09-23 DIAGNOSIS — F1010 Alcohol abuse, uncomplicated: Secondary | ICD-10-CM | POA: Diagnosis present

## 2017-09-23 DIAGNOSIS — E876 Hypokalemia: Secondary | ICD-10-CM | POA: Diagnosis present

## 2017-09-23 DIAGNOSIS — I10 Essential (primary) hypertension: Secondary | ICD-10-CM | POA: Diagnosis present

## 2017-09-23 DIAGNOSIS — Z23 Encounter for immunization: Secondary | ICD-10-CM | POA: Diagnosis not present

## 2017-09-23 DIAGNOSIS — Z7984 Long term (current) use of oral hypoglycemic drugs: Secondary | ICD-10-CM | POA: Diagnosis not present

## 2017-09-23 DIAGNOSIS — N390 Urinary tract infection, site not specified: Secondary | ICD-10-CM | POA: Diagnosis present

## 2017-09-23 DIAGNOSIS — N179 Acute kidney failure, unspecified: Secondary | ICD-10-CM | POA: Diagnosis present

## 2017-09-23 DIAGNOSIS — E1110 Type 2 diabetes mellitus with ketoacidosis without coma: Principal | ICD-10-CM | POA: Diagnosis present

## 2017-09-23 DIAGNOSIS — Z79899 Other long term (current) drug therapy: Secondary | ICD-10-CM | POA: Diagnosis not present

## 2017-09-23 DIAGNOSIS — Z9114 Patient's other noncompliance with medication regimen: Secondary | ICD-10-CM

## 2017-09-23 DIAGNOSIS — F17210 Nicotine dependence, cigarettes, uncomplicated: Secondary | ICD-10-CM | POA: Diagnosis present

## 2017-09-23 DIAGNOSIS — F1910 Other psychoactive substance abuse, uncomplicated: Secondary | ICD-10-CM | POA: Diagnosis present

## 2017-09-23 DIAGNOSIS — Z794 Long term (current) use of insulin: Secondary | ICD-10-CM | POA: Diagnosis not present

## 2017-09-23 DIAGNOSIS — E1310 Other specified diabetes mellitus with ketoacidosis without coma: Secondary | ICD-10-CM

## 2017-09-23 DIAGNOSIS — E860 Dehydration: Secondary | ICD-10-CM | POA: Diagnosis present

## 2017-09-23 LAB — CBC WITH DIFFERENTIAL/PLATELET
BASOS PCT: 1 %
Basophils Absolute: 0.1 10*3/uL (ref 0–0.1)
EOS ABS: 0 10*3/uL (ref 0–0.7)
Eosinophils Relative: 0 %
HCT: 48.6 % — ABNORMAL HIGH (ref 35.0–47.0)
HEMOGLOBIN: 16 g/dL (ref 12.0–16.0)
LYMPHS ABS: 1.4 10*3/uL (ref 1.0–3.6)
Lymphocytes Relative: 12 %
MCH: 30.3 pg (ref 26.0–34.0)
MCHC: 32.9 g/dL (ref 32.0–36.0)
MCV: 92.1 fL (ref 80.0–100.0)
Monocytes Absolute: 0.9 10*3/uL (ref 0.2–0.9)
Monocytes Relative: 8 %
NEUTROS PCT: 79 %
Neutro Abs: 9.6 10*3/uL — ABNORMAL HIGH (ref 1.4–6.5)
Platelets: 260 10*3/uL (ref 150–440)
RBC: 5.28 MIL/uL — ABNORMAL HIGH (ref 3.80–5.20)
RDW: 13.3 % (ref 11.5–14.5)
WBC: 12 10*3/uL — AB (ref 3.6–11.0)

## 2017-09-23 LAB — URINALYSIS, COMPLETE (UACMP) WITH MICROSCOPIC
BILIRUBIN URINE: NEGATIVE
Glucose, UA: >=500 mg/dL — AB
Ketones, ur: 20 mg/dL — AB
Nitrite: NEGATIVE
Protein, ur: NEGATIVE mg/dL
SPECIFIC GRAVITY, URINE: 1.025 (ref 1.005–1.030)
pH: 5 (ref 5.0–8.0)

## 2017-09-23 LAB — BASIC METABOLIC PANEL
Anion gap: 16 — ABNORMAL HIGH (ref 5–15)
BUN: 20 mg/dL (ref 6–20)
CHLORIDE: 90 mmol/L — AB (ref 101–111)
CO2: 20 mmol/L — ABNORMAL LOW (ref 22–32)
Calcium: 9.7 mg/dL (ref 8.9–10.3)
Creatinine, Ser: 1.32 mg/dL — ABNORMAL HIGH (ref 0.44–1.00)
GFR calc non Af Amer: 44 mL/min — ABNORMAL LOW (ref >60)
GFR, EST AFRICAN AMERICAN: 51 mL/min — AB (ref >60)
Glucose, Bld: 830 mg/dL (ref 65–99)
Potassium: 5.1 mmol/L (ref 3.5–5.1)
SODIUM: 126 mmol/L — AB (ref 135–145)

## 2017-09-23 LAB — GLUCOSE, CAPILLARY

## 2017-09-23 MED ORDER — SODIUM CHLORIDE 0.9 % IV BOLUS (SEPSIS)
1000.0000 mL | Freq: Once | INTRAVENOUS | Status: AC
  Administered 2017-09-24: 1000 mL via INTRAVENOUS

## 2017-09-23 MED ORDER — ADULT MULTIVITAMIN W/MINERALS CH
1.0000 | ORAL_TABLET | Freq: Every day | ORAL | Status: DC
  Administered 2017-09-24: 1 via ORAL
  Filled 2017-09-23: qty 1

## 2017-09-23 MED ORDER — SODIUM CHLORIDE 0.9 % IV SOLN
INTRAVENOUS | Status: DC
  Filled 2017-09-23: qty 1

## 2017-09-23 MED ORDER — DEXTROSE-NACL 5-0.45 % IV SOLN: INTRAVENOUS | Status: DC

## 2017-09-23 MED ORDER — FOLIC ACID 1 MG PO TABS
1.0000 mg | ORAL_TABLET | Freq: Every day | ORAL | Status: DC
  Administered 2017-09-24: 1 mg via ORAL
  Filled 2017-09-23: qty 1

## 2017-09-23 MED ORDER — SODIUM CHLORIDE 0.9 % IV SOLN
INTRAVENOUS | Status: DC
  Administered 2017-09-23: 5.4 [IU]/h via INTRAVENOUS
  Filled 2017-09-23: qty 1

## 2017-09-23 MED ORDER — SODIUM CHLORIDE 0.9 % IV BOLUS (SEPSIS)
1000.0000 mL | Freq: Once | INTRAVENOUS | Status: AC
  Administered 2017-09-23: 1000 mL via INTRAVENOUS

## 2017-09-23 MED ORDER — FAMOTIDINE IN NACL 20-0.9 MG/50ML-% IV SOLN
20.0000 mg | Freq: Two times a day (BID) | INTRAVENOUS | Status: DC
  Administered 2017-09-24 (×2): 20 mg via INTRAVENOUS
  Filled 2017-09-23 (×2): qty 50

## 2017-09-23 MED ORDER — SODIUM CHLORIDE 0.9 % IV SOLN
INTRAVENOUS | Status: DC
  Administered 2017-09-24: via INTRAVENOUS

## 2017-09-23 MED ORDER — SODIUM CHLORIDE 0.9 % IV SOLN
1.0000 g | INTRAVENOUS | Status: DC
  Administered 2017-09-24: 1 g via INTRAVENOUS
  Filled 2017-09-23 (×2): qty 10

## 2017-09-23 MED ORDER — INFLUENZA VAC SPLIT QUAD 0.5 ML IM SUSY
0.5000 mL | PREFILLED_SYRINGE | INTRAMUSCULAR | Status: AC
  Administered 2017-09-24: 0.5 mL via INTRAMUSCULAR
  Filled 2017-09-23: qty 0.5

## 2017-09-23 MED ORDER — LISINOPRIL 5 MG PO TABS
10.0000 mg | ORAL_TABLET | Freq: Every day | ORAL | Status: DC
  Administered 2017-09-24: 10 mg via ORAL
  Filled 2017-09-23: qty 2

## 2017-09-23 MED ORDER — SODIUM CHLORIDE 0.9 % IV SOLN
INTRAVENOUS | Status: AC
  Administered 2017-09-23: 23:00:00 via INTRAVENOUS

## 2017-09-23 MED ORDER — LORAZEPAM 2 MG/ML IJ SOLN: 2.0000 mg | INTRAMUSCULAR | Status: DC | PRN

## 2017-09-23 MED ORDER — VITAMIN B-1 100 MG PO TABS
100.0000 mg | ORAL_TABLET | Freq: Every day | ORAL | Status: DC
  Administered 2017-09-24: 100 mg via ORAL
  Filled 2017-09-23: qty 1

## 2017-09-23 MED ORDER — HEPARIN SODIUM (PORCINE) 5000 UNIT/ML IJ SOLN
5000.0000 [IU] | Freq: Three times a day (TID) | INTRAMUSCULAR | Status: DC
  Administered 2017-09-24 (×2): 5000 [IU] via SUBCUTANEOUS
  Filled 2017-09-23 (×2): qty 1

## 2017-09-23 MED ORDER — DEXTROSE-NACL 5-0.45 % IV SOLN
INTRAVENOUS | Status: DC
  Administered 2017-09-24: 04:00:00 via INTRAVENOUS

## 2017-09-23 NOTE — ED Triage Notes (Signed)
Pt to ED via EMS from home c/o hyperglycemia, increased thirst and urinary frequency for a couple months.  States no meds for that time, insulin pens at home not working. EMS CBG [id number].

## 2017-09-23 NOTE — ED Provider Notes (Signed)
[facility] Emergency Department Provider Note  ___________________________________________   First MD Initiated Contact with Patient 09/23/17 1950     (approximate)  I have reviewed the triage vital signs and the nursing notes.   HISTORY  Chief Complaint Hyperglycemia   HPI Pamela Lane is a 57 y.o. female with history of diabetes as well as asthma who is presenting to the emergency department today with nausea and vomiting as well as elevated blood sugar.  The patient says that she has been out of her medication now, insulin, for 2 months.  She says that she is also developed increased thirst as well as frequent urination.  Says that she has been drinking Gatorade because she is very thirsty.  Past Medical History:  Diagnosis Date  . Asthma   . Diabetes mellitus without complication ([place])   . Hypertension   . Mental disorder    pt reports 'I have all of them mental disorders'    Patient Active Problem List   Diagnosis Date Noted  . Acute pancreatitis 05/15/2016  . AKI (acute kidney injury) ([place])   . Diabetic ketoacidosis without coma associated with type 2 diabetes mellitus ([place])   . Hyperglycemia due to type 2 diabetes mellitus ([place]) 01/13/2016  . UTI (urinary tract infection) 01/13/2016  . DKA (diabetic ketoacidoses) ([place]) 01/08/2016  . Hyponatremia 01/08/2016  . Dehydration 01/08/2016  . Urinary tract infection 01/08/2016  . Upper abdominal pain 01/08/2016  . Nausea & vomiting 01/08/2016    Past Surgical History:  Procedure Laterality Date  . ABDOMINAL HYSTERECTOMY      Prior to Admission medications   Medication Sig Start Date End Date Taking? Authorizing Provider  insulin NPH-regular Human (NOVOLIN 70/30) (70-30) 100 UNIT/ML injection Inject 20 Units into the skin 2 (two) times daily with a meal. 05/16/16 09/23/17 Yes Mody, Sital, MD  lisinopril (PRINIVIL) 10 MG tablet Take 1 tablet (10 mg total) by mouth daily. 04/24/17  Yes  Delman Kitten, MD  blood glucose meter kit and supplies KIT Dispense based on patient and insurance preference. Use up to four times daily as directed. (FOR ICD-9 250.00, 250.01). 01/05/16   Joanne Gavel, MD  metFORMIN (GLUCOPHAGE) 500 MG tablet Take 1 tablet (500 mg total) by mouth 2 (two) times daily with a meal. Patient not taking: Reported on 09/23/2017 01/14/16   Tat, Shanon Brow, MD  ondansetron (ZOFRAN) 4 MG tablet Take 1 tablet (4 mg total) by mouth every 8 (eight) hours as needed for nausea or vomiting. Patient not taking: Reported on 09/23/2017 05/11/16   Nance Pear, MD    Allergies Patient has no known allergies.  Family History  Problem Relation Age of Onset  . Hypertension Mother     Social History Social History   Tobacco Use  . Smoking status: Current Every Day Smoker    Packs/day: 3.00    Types: Cigarettes  . Smokeless tobacco: Never Used  Substance Use Topics  . Alcohol use: Yes    Alcohol/week: 24.0 oz    Types: 40 Cans of beer per week    Comment: 3 big bottles of liquor  . Drug use: Yes    Types: Marijuana, Cocaine    Review of Systems  Constitutional: No fever/chills Eyes: No visual changes. ENT: No sore throat. Cardiovascular: Denies chest pain. Respiratory: Denies shortness of breath. Gastrointestinal: No abdominal pain.  No diarrhea.  No constipation. Genitourinary: Negative for dysuria. Musculoskeletal: Negative for back pain. Skin: Negative for rash. Neurological: Negative for  headaches, focal weakness or numbness.   ____________________________________________   PHYSICAL EXAM:  VITAL SIGNS: ED Triage Vitals  Enc Vitals Group     BP 09/23/17 1957 (!) 155/102     Pulse Rate 09/23/17 1957 (!) 104     Resp 09/23/17 1957 18     Temp 09/23/17 1957 98.4 F (36.9 C)     Temp Source 09/23/17 1957 Oral     SpO2 09/23/17 1957 95 %     Weight 09/23/17 2000 230 lb (104.3 kg)     Height 09/23/17 2000 '5\' 3"'  (1.6 m)     Head Circumference --       Peak Flow --      Pain Score --      Pain Loc --      Pain Edu? --      Excl. in [place]? --     Constitutional: Alert and oriented. Well appearing and in no acute distress. Eyes: Conjunctivae are normal.  Head: Atraumatic. Nose: No congestion/rhinnorhea. Mouth/Throat: Mucous membranes are moist.  Neck: No stridor.   Cardiovascular: Normal rate, regular rhythm. Grossly normal heart sounds.  Respiratory: Normal respiratory effort.  No retractions. Lungs CTAB. Gastrointestinal: Soft and nontender. No distention.  Musculoskeletal: No lower extremity tenderness nor edema.  No joint effusions. Neurologic:  Normal speech and language. No gross focal neurologic deficits are appreciated. Skin:  Skin is warm, dry and intact. No rash noted. Psychiatric: Mood and affect are normal. Speech and behavior are normal.  ____________________________________________   LABS (all labs ordered are listed, but only abnormal results are displayed)  Labs Reviewed  URINALYSIS, COMPLETE (UACMP) WITH MICROSCOPIC - Abnormal; Notable for the following components:      Result Value   Color, Urine STRAW (*)    APPearance HAZY (*)    Glucose, UA >=500 (*)    Hgb urine dipstick SMALL (*)    Ketones, ur 20 (*)    Leukocytes, UA LARGE (*)    Bacteria, UA RARE (*)    Squamous Epithelial / LPF 0-5 (*)    All other components within normal limits  CBC WITH DIFFERENTIAL/PLATELET - Abnormal; Notable for the following components:   WBC 12.0 (*)    RBC 5.28 (*)    HCT 48.6 (*)    Neutro Abs 9.6 (*)    All other components within normal limits  BASIC METABOLIC PANEL - Abnormal; Notable for the following components:   Sodium 126 (*)    Chloride 90 (*)    CO2 20 (*)    Glucose, Bld 830 (*)    Creatinine, Ser 1.32 (*)    GFR calc non Af Amer 44 (*)    GFR calc Af Amer 51 (*)    Anion gap 16 (*)    All other components within normal limits  GLUCOSE, CAPILLARY - Abnormal; Notable for the following components:    Glucose-Capillary >600 (*)    All other components within normal limits   ____________________________________________  EKG   ____________________________________________  RADIOLOGY   ____________________________________________   PROCEDURES  Procedure(s) performed:   .Critical Care Performed by: Orbie Pyo, MD Authorized by: Orbie Pyo, MD   Critical care provider statement:    Critical care time (minutes):  35   Critical care was necessary to treat or prevent imminent or life-threatening deterioration of the following conditions:  Metabolic crisis   Critical care was time spent personally by me on the following activities:  Examination of patient, ordering and performing  treatments and interventions, ordering and review of laboratory studies, ordering and review of radiographic studies, pulse oximetry and re-evaluation of patient's condition    Critical Care performed:   ____________________________________________   INITIAL IMPRESSION / ASSESSMENT AND PLAN / ED COURSE  Pertinent labs & imaging results that were available during my care of the patient were reviewed by me and considered in my medical decision making (see chart for details).  DDX: Hyperglycemia, DKA, urinary frequency, renal failure, nausea and vomiting As part of my medical decision making, I reviewed the following data within the [facility] Notes from prior ED visits  ----------------------------------------- 9:21 PM on 09/23/2017 -----------------------------------------  Patient with a blood pressure in the 90s.  Heart rate in the low 100s.  Anion gap of 16 with a glucose of over 800 and ketones in the urine.  Likely mild DKA.  Patient will be admitted to the hospital.  She is aware of this.  Signed out to Dr. Prudence Davidson. ____________________________________________   FINAL CLINICAL IMPRESSION(S) / ED DIAGNOSES  DKA.    NEW MEDICATIONS STARTED  DURING THIS VISIT:  New Prescriptions   No medications on file     Note:  This document was prepared using Dragon voice recognition software and may include unintentional dictation errors.     Orbie Pyo, MD 09/23/17 2122

## 2017-09-23 NOTE — ED Notes (Signed)
[name] RN, aware of bed asssigned

## 2017-09-23 NOTE — ED Notes (Signed)
Stephen RN, aware of bed asssigned 

## 2017-09-23 NOTE — Consult Note (Signed)
Name: Pamela Lane MRN: [id number] DOB: [birth year]    ADMISSION DATE:  09/23/2017 CONSULTATION DATE: 09/23/2017  REFERRING MD : Dr. Duane Boston   CHIEF COMPLAINT: Hyperglycemia   BRIEF PATIENT DESCRIPTION:  57 yo female with hx of ETOH and Polysubstance Abuse admitted with UTI and DKA secondary to medication noncompliance requiring insulin gtt  SIGNIFICANT EVENTS  03/15-Pt admitted to stepdown unit   STUDIES:  None   HISTORY OF PRESENT ILLNESS:   This is a 57 yo female with a PMH of Mental Disorder, HTN, Diabetes Mellitus, ETOH and Polysubstance Abuse, and Asthma. She presented to [facility] ER via EMS on 03/15 with c/o hyperglycemia, increased thirst, and urinary frequency onset of symptoms 2 months prior to presentation.  Per ER notes the pt stated she has not been taking her diabetes medication, and her insulin pens at home are not working.  Upon EMS arrival pt had CBG of 477.  In the ER lab results ruled pt in for DKA, therefore insulin gtt ordered.  UA positive for UTI.  Pt states she has been on a drinking binge over the past 2 weeks on average drinking a 6 pack daily, her most recent drink was the morning of 03/15.  She also states she smokes marijuana daily and occasionally smokes crack cocaine.  She was subsequently admitted to the stepdown unit by hospitalist team for further workup and treatment.  PAST MEDICAL HISTORY :   has a past medical history of Asthma, Diabetes mellitus without complication ([place]), Hypertension, and Mental disorder.  has a past surgical history that includes Abdominal hysterectomy. Prior to Admission medications   Medication Sig Start Date End Date Taking? Authorizing Provider  insulin NPH-regular Human (NOVOLIN 70/30) (70-30) 100 UNIT/ML injection Inject 20 Units into the skin 2 (two) times daily with a meal. 05/16/16 09/23/17 Yes Mody, Sital, MD  lisinopril (PRINIVIL) 10 MG tablet Take 1 tablet (10 mg total) by mouth daily. 04/24/17  Yes Delman Kitten, MD    blood glucose meter kit and supplies KIT Dispense based on patient and insurance preference. Use up to four times daily as directed. (FOR ICD-9 250.00, 250.01). 01/05/16   Joanne Gavel, MD  metFORMIN (GLUCOPHAGE) 500 MG tablet Take 1 tablet (500 mg total) by mouth 2 (two) times daily with a meal. Patient not taking: Reported on 09/23/2017 01/14/16   Tat, Shanon Brow, MD  ondansetron (ZOFRAN) 4 MG tablet Take 1 tablet (4 mg total) by mouth every 8 (eight) hours as needed for nausea or vomiting. Patient not taking: Reported on 09/23/2017 05/11/16   Nance Pear, MD   No Known Allergies  FAMILY HISTORY:  family history includes Hypertension in her mother. SOCIAL HISTORY:  reports that she has been smoking cigarettes.  She has been smoking about 3.00 packs per day. she has never used smokeless tobacco. She reports that she drinks about 24.0 oz of alcohol per week. She reports that she uses drugs. Drugs: Marijuana and Cocaine.  REVIEW OF SYSTEMS: Positives in BOLD   Constitutional: Negative for fever, chills, weight loss, malaise/fatigue and diaphoresis.  HENT: Negative for hearing loss, ear pain, nosebleeds, congestion, sore throat, neck pain, tinnitus and ear discharge.   Eyes: Negative for blurred vision, double vision, photophobia, pain, discharge and redness.  Respiratory: Negative for cough, hemoptysis, sputum production, shortness of breath, wheezing and stridor.   Cardiovascular: Negative for chest pain, palpitations, orthopnea, claudication, leg swelling and PND.  Gastrointestinal: heartburn, nausea, vomiting, abdominal pain, diarrhea, constipation, blood in stool and melena.  Genitourinary: dysuria, urgency, frequency, hematuria and flank pain.  Musculoskeletal: Negative for myalgias, back pain, joint pain and falls.  Skin: Negative for itching and rash.  Neurological: Negative for dizziness, tingling, tremors, sensory change, speech change, focal weakness, seizures, loss of consciousness,  weakness and headaches.  Endo/Heme/Allergies: environmental allergies, hyperglycemia, and polydipsia. Does not bruise/bleed easily.  SUBJECTIVE:  c/o of thirst   VITAL SIGNS: Temp:  [98.4 F (36.9 C)] 98.4 F (36.9 C) (03/15 1957) Pulse Rate:  [104] 104 (03/15 1957) Resp:  [18] 18 (03/15 1957) BP: (155)/(102) 155/102 (03/15 1957) SpO2:  [95 %] 95 % (03/15 1957) Weight:  [104.3 kg (230 lb)] 104.3 kg (230 lb) (03/15 2000)  PHYSICAL EXAMINATION: General: well developed, well nourished, NAD  Neuro: alert and oriented, follows commands  HEENT: supple, no JVD  Cardiovascular: sinus tach, no M/R/G Lungs: clear throughout, even, non labored  Abdomen: upper epigastric tenderness, obese, soft, non distended  Musculoskeletal: normal bulk and tone, no edema  Skin: intact no rashes or lesions   Recent Labs  Lab 09/23/17 2031  NA 126*  K 5.1  CL 90*  CO2 20*  BUN 20  CREATININE 1.32*  GLUCOSE 830*   Recent Labs  Lab 09/23/17 2031  HGB 16.0  HCT 48.6*  WBC 12.0*  PLT 260   No results found.  ASSESSMENT / PLAN: Diabetic Ketoacidosis secondary to medication noncompliance Pseudohyponatremia in setting of DKA  Acute renal failure likely secondary to dehydration  UTI ETOH and Polysubstance Abuse  P: Continue insulin gtt until anion gap closed  BMP's q4hrs and CBG's q1hr while on insulin gtt  NS '@150'  ml/hr will transition to D5W1/2NS once CBG's <250  Continuous telemetry monitoring  Trend WBC and monitor fever curve  Will start ceftriaxone  Follow urine culture  Trend BMP Replace electrolytes as indicated  Monitor UOP Subq heparin for VTE prophylaxis  Trend CBC  Monitor for s/sx of bleeding and transfuse for hgb <7 Care management consulted to assist with medication needs  CIWA protocol  Thiamine, folic acid, and mvi  Will start pepcid  ETOH and polysubstance abuse counseling provided   Marda Stalker, [place] Pager [phone] (please enter  7 digits) PCCM Consult Pager [phone] (please enter 7 digits)

## 2017-09-23 NOTE — Progress Notes (Signed)
Pharmacy Antibiotic Note  Pamela Lane is a 57 y.o. female admitted on 09/23/2017 with UTI.  Pharmacy has been consulted for ceftiraxone dosing.  Plan: Will start patient on ceftriaxone 1g IV daily for UTI (recommended duration 3 - 5 days depending on clinical status)  Height: 5\' 3"  (160 cm) Weight: 230 lb (104.3 kg) IBW/kg (Calculated) : 52.4  Temp (24hrs), Avg:98.4 F (36.9 C), Min:98.4 F (36.9 C), Max:98.4 F (36.9 C)  Recent Labs  Lab 09/23/17 2031  WBC 12.0*  CREATININE 1.32*    Estimated Creatinine Clearance: 55 mL/min (A) (by C-G formula based on SCr of 1.32 mg/dL (H)).    No Known Allergies  Thank you for allowing pharmacy to be a part of this patient's care.  Thomasene Rippleavid Muhanad Torosyan, PharmD, BCPS Clinical Pharmacist 09/23/2017

## 2017-09-23 NOTE — H&P (Signed)
[facility] [place] NAME: Pamela Lane    MR#:  [id number]  DATE OF BIRTH:  13-Mar-1961  DATE OF ADMISSION:  09/23/2017  PRIMARY CARE PHYSICIAN: [facility] [place]   REQUESTING/REFERRING PHYSICIAN:   CHIEF COMPLAINT:   Chief Complaint  Patient presents with  . Hyperglycemia    HISTORY OF PRESENT ILLNESS: Pamela Lane  is a 57 y.o. female with a known history of asthma, hypertension and diabetes type 2. Patient presents to emergency room for nausea, vomiting and generalized weakness going on for the past 2-3 days, gradually getting worse.  She also complains of increased thirst and frequent urination in the past 24 hours.  Patient admits to being out of insulin for the past 2 months, as she was not able to refill it from the primary care office. Blood test done in emergency room were notable for blood sugar at 830, potassium level of 5.1, sodium of 126, creatinine of 1.32.  WBC is 12,000.  Anion gap is elevated at 16.  UA is positive for UTI.  Patient is admitted to intensive care unit for treatment of DKA.  PAST MEDICAL HISTORY:   Past Medical History:  Diagnosis Date  . Asthma   . Diabetes mellitus without complication ([place])   . Hypertension   . Mental disorder    pt reports 'I have all of them mental disorders'    PAST SURGICAL HISTORY:  Past Surgical History:  Procedure Laterality Date  . ABDOMINAL HYSTERECTOMY      SOCIAL HISTORY:  Social History   Tobacco Use  . Smoking status: Current Every Day Smoker    Packs/day: 3.00    Types: Cigarettes  . Smokeless tobacco: Never Used  Substance Use Topics  . Alcohol use: Yes    Alcohol/week: 24.0 oz    Types: 40 Cans of beer per week    Comment: 3 big bottles of liquor    FAMILY HISTORY:  Family History  Problem Relation Age of Onset  . Hypertension Mother     DRUG ALLERGIES: No Known Allergies  REVIEW OF SYSTEMS:   CONSTITUTIONAL:  No fever, but positive for fatigue and generalized weakness.  EYES: No vision changes.  EARS, NOSE, AND THROAT: No tinnitus or ear pain.  RESPIRATORY: No cough, shortness of breath, wheezing or hemoptysis.  CARDIOVASCULAR: No chest pain, orthopnea, edema.  GASTROINTESTINAL: Positive for nausea and vomiting.  No diarrhea or abdominal pain.  GENITOURINARY: Positive for frequent urination.  ENDOCRINE: Positive for polydipsia and polyuria  HEMATOLOGY: No bleeding SKIN: No rash or lesion. MUSCULOSKELETAL: No joint pain.   NEUROLOGIC: No focal weakness.  PSYCHIATRY: No anxiety or depression.   MEDICATIONS AT HOME:  Prior to Admission medications   Medication Sig Start Date End Date Taking? Authorizing Provider  insulin NPH-regular Human (NOVOLIN 70/30) (70-30) 100 UNIT/ML injection Inject 20 Units into the skin 2 (two) times daily with a meal. 05/16/16 09/23/17 Yes Mody, Sital, MD  lisinopril (PRINIVIL) 10 MG tablet Take 1 tablet (10 mg total) by mouth daily. 04/24/17  Yes Delman Kitten, MD  blood glucose meter kit and supplies KIT Dispense based on patient and insurance preference. Use up to four times daily as directed. (FOR ICD-9 250.00, 250.01). 01/05/16   Joanne Gavel, MD  metFORMIN (GLUCOPHAGE) 500 MG tablet Take 1 tablet (500 mg total) by mouth 2 (two) times daily with a meal. Patient not taking: Reported on 09/23/2017 01/14/16   Orson Eva, MD  ondansetron (ZOFRAN) 4 MG tablet Take 1 tablet (4 mg total) by mouth every 8 (eight) hours as needed for nausea or vomiting. Patient not taking: Reported on 09/23/2017 05/11/16   Nance Pear, MD      PHYSICAL EXAMINATION:   VITAL SIGNS: Blood pressure (!) 146/63, pulse (!) 104, temperature 98.9 F (37.2 C), temperature source Oral, resp. rate 18, height _0  (1.6 m), weight 102.5 kg (225 lb 15.5 oz), SpO2 95 %.  GENERAL:  57 y.o.-year-old patient lying in the bed, in moderate distress, secondary to nausea and generalized weakness.  Patient  looks acutely ill but nontoxic EYES: Pupils equal, round, reactive to light and accommodation. No scleral icterus. Extraocular muscles intact.  HEENT: Head atraumatic, normocephalic. Oropharynx and nasopharynx clear.  NECK:  Supple, no jugular venous distention. No thyroid enlargement, no tenderness.  LUNGS: Normal breath sounds bilaterally, no wheezing, rales,rhonchi or crepitation. No use of accessory muscles of respiration.  CARDIOVASCULAR: S1, S2 normal. No murmurs, rubs, or gallops.  ABDOMEN: Soft, nontender, nondistended. Bowel sounds present. No organomegaly or mass.  EXTREMITIES: No pedal edema, cyanosis, or clubbing.  NEUROLOGIC: No focal weakness.  Gait not checked, as patient is too sick to ambulate.  PSYCHIATRIC: The patient is alert and oriented x 3.  SKIN: No obvious rash, lesion, or ulcer.   LABORATORY PANEL:   CBC Recent Labs  Lab 09/23/17 2031  WBC 12.0*  HGB 16.0  HCT 48.6*  PLT 260  MCV 92.1  MCH 30.3  MCHC 32.9  RDW 13.3  LYMPHSABS 1.4  MONOABS 0.9  EOSABS 0.0  BASOSABS 0.1   ------------------------------------------------------------------------------------------------------------------  Chemistries  Recent Labs  Lab 09/23/17 2031  NA 126*  K 5.1  CL 90*  CO2 20*  GLUCOSE 830*  BUN 20  CREATININE 1.32*  CALCIUM 9.7   ------------------------------------------------------------------------------------------------------------------ estimated creatinine clearance is 54.4 mL/min (A) (by C-G formula based on SCr of 1.32 mg/dL (H)). ------------------------------------------------------------------------------------------------------------------ No results for input(s): TSH, T4TOTAL, T3FREE, THYROIDAB in the last 72 hours.  Invalid input(s): FREET3   Coagulation profile No results for input(s): INR, PROTIME in the last 168  hours. ------------------------------------------------------------------------------------------------------------------- No results for input(s): DDIMER in the last 72 hours. -------------------------------------------------------------------------------------------------------------------  Cardiac Enzymes No results for input(s): CKMB, TROPONINI, MYOGLOBIN in the last 168 hours.  Invalid input(s): CK ------------------------------------------------------------------------------------------------------------------ Invalid input(s): POCBNP  ---------------------------------------------------------------------------------------------------------------  Urinalysis    Component Value Date/Time   COLORURINE STRAW (A) 09/23/2017 2031   APPEARANCEUR HAZY (A) 09/23/2017 2031   LABSPEC 1.025 09/23/2017 2031   PHURINE 5.0 09/23/2017 2031   GLUCOSEU >=500 (A) 09/23/2017 2031   HGBUR SMALL (A) 09/23/2017 2031   BILIRUBINUR NEGATIVE 09/23/2017 2031   KETONESUR 20 (A) 09/23/2017 2031   PROTEINUR NEGATIVE 09/23/2017 2031   NITRITE NEGATIVE 09/23/2017 2031   LEUKOCYTESUR LARGE (A) 09/23/2017 2031     RADIOLOGY: No results found.  EKG: Orders placed or performed during the hospital encounter of 04/24/17  . ED EKG  . ED EKG  . EKG    IMPRESSION AND PLAN:   1.  DKA.  Will start patient on insulin drip and admit to intensive care unit for close monitoring. 2.  Acute UTI.  Will start Rocephin IV, while waiting for final urine culture result. 3.  History of asthma.  Patient is clinically stable, we will continue to monitor.  All the records are reviewed and case discussed with ED provider. Management plans discussed with the patient, family and they are in agreement.  CODE STATUS:  Code Status Orders  (From admission, onward)        Start     Ordered   09/23/17 2253  Full code  Continuous     09/23/17 2252    Code Status History    Date Active Date Inactive Code Status  Order ID Comments User Context   05/15/2016 17:45 05/16/2016 17:49 Full Code [id number]  Dustin Flock, MD Inpatient   01/13/2016 00:51 01/14/2016 13:14 Full Code [id number]  Norval Morton, MD ED   01/08/2016 19:16 01/09/2016 17:04 Full Code [id number]  Theodoro Grist, MD Inpatient       TOTAL TIME TAKING CARE OF THIS PATIENT: 45 minutes.    Amelia Jo M.D on 09/23/2017 at 11:36 PM  Between 7am to 6pm - Pager - [phone]  After 6pm go to [email or web address] - password [email or web address] [facility]  Office  [phone]  CC: Primary care physician; [facility] [place]

## 2017-09-24 DIAGNOSIS — R739 Hyperglycemia, unspecified: Secondary | ICD-10-CM

## 2017-09-24 LAB — MAGNESIUM: Magnesium: 2.1 mg/dL (ref 1.7–2.4)

## 2017-09-24 LAB — BASIC METABOLIC PANEL
Anion gap: 11 (ref 5–15)
Anion gap: 9 (ref 5–15)
BUN: 14 mg/dL (ref 6–20)
BUN: 16 mg/dL (ref 6–20)
CALCIUM: 8.5 mg/dL — AB (ref 8.9–10.3)
CALCIUM: 8.8 mg/dL — AB (ref 8.9–10.3)
CO2: 21 mmol/L — ABNORMAL LOW (ref 22–32)
CO2: 24 mmol/L (ref 22–32)
CREATININE: 0.76 mg/dL (ref 0.44–1.00)
CREATININE: 0.89 mg/dL (ref 0.44–1.00)
Chloride: 102 mmol/L (ref 101–111)
Chloride: 105 mmol/L (ref 101–111)
GFR calc Af Amer: >60 mL/min (ref >60)
Glucose, Bld: 390 mg/dL — ABNORMAL HIGH (ref 65–99)
Glucose, Bld: 178 mg/dL — ABNORMAL HIGH (ref 65–99)
Potassium: 4.1 mmol/L (ref 3.5–5.1)
Potassium: 3.2 mmol/L — ABNORMAL LOW (ref 3.5–5.1)
SODIUM: 134 mmol/L — AB (ref 135–145)
SODIUM: 138 mmol/L (ref 135–145)

## 2017-09-24 LAB — GLUCOSE, CAPILLARY
GLUCOSE-CAPILLARY: 301 mg/dL — AB (ref 65–99)
GLUCOSE-CAPILLARY: 201 mg/dL — AB (ref 65–99)
GLUCOSE-CAPILLARY: 151 mg/dL — AB (ref 65–99)
GLUCOSE-CAPILLARY: 152 mg/dL — AB (ref 65–99)
GLUCOSE-CAPILLARY: 185 mg/dL — AB (ref 65–99)
Glucose-Capillary: 166 mg/dL — ABNORMAL HIGH (ref 65–99)
Glucose-Capillary: 168 mg/dL — ABNORMAL HIGH (ref 65–99)
Glucose-Capillary: 196 mg/dL — ABNORMAL HIGH (ref 65–99)
Glucose-Capillary: 284 mg/dL — ABNORMAL HIGH (ref 65–99)
Glucose-Capillary: 319 mg/dL — ABNORMAL HIGH (ref 65–99)
Glucose-Capillary: 417 mg/dL — ABNORMAL HIGH (ref 65–99)
Glucose-Capillary: >600 mg/dL (ref 65–99)

## 2017-09-24 LAB — URINE DRUG SCREEN, QUALITATIVE (ARMC ONLY)
Amphetamines, Ur Screen: NOT DETECTED
Barbiturates, Ur Screen: NOT DETECTED
Benzodiazepine, Ur Scrn: NOT DETECTED
Cannabinoid 50 Ng, Ur ~~LOC~~: NOT DETECTED
Cocaine Metabolite,Ur ~~LOC~~: POSITIVE — AB
MDMA (Ecstasy)Ur Screen: NOT DETECTED
Methadone Scn, Ur: NOT DETECTED
Opiate, Ur Screen: NOT DETECTED
Phencyclidine (PCP) Ur S: NOT DETECTED
Tricyclic, Ur Screen: NOT DETECTED

## 2017-09-24 LAB — HEMOGLOBIN A1C
HEMOGLOBIN A1C: 11.3 % — AB (ref 4.8–5.6)
Mean Plasma Glucose: 277.61 mg/dL

## 2017-09-24 LAB — MRSA PCR SCREENING: MRSA BY PCR: NEGATIVE

## 2017-09-24 MED ORDER — ONDANSETRON HCL 4 MG/2ML IJ SOLN
4.0000 mg | Freq: Four times a day (QID) | INTRAMUSCULAR | Status: DC | PRN
  Administered 2017-09-24 (×2): 4 mg via INTRAVENOUS
  Filled 2017-09-24 (×2): qty 2

## 2017-09-24 MED ORDER — INSULIN NPH ISOPHANE & REGULAR (70-30) 100 UNIT/ML ~~LOC~~ SUSP: 22.0000 [IU] | Freq: Two times a day (BID) | SUBCUTANEOUS | 0 refills | Status: DC

## 2017-09-24 MED ORDER — METFORMIN HCL 500 MG PO TABS: 500.0000 mg | ORAL_TABLET | Freq: Two times a day (BID) | ORAL | 1 refills | Status: DC

## 2017-09-24 MED ORDER — INSULIN ASPART PROT & ASPART (70-30 MIX) 100 UNIT/ML ~~LOC~~ SUSP
12.0000 [IU] | Freq: Two times a day (BID) | SUBCUTANEOUS | Status: DC
  Administered 2017-09-24: 12 [IU] via SUBCUTANEOUS
  Filled 2017-09-24: qty 1

## 2017-09-24 MED ORDER — INSULIN REGULAR BOLUS VIA INFUSION
0.0000 [IU] | Freq: Three times a day (TID) | INTRAVENOUS | Status: DC
  Administered 2017-09-24: 7 [IU] via INTRAVENOUS
  Filled 2017-09-24: qty 10

## 2017-09-24 MED ORDER — SODIUM CHLORIDE 0.9 % IV SOLN: 1.0000 g | INTRAVENOUS | Status: DC

## 2017-09-24 MED ORDER — CEPHALEXIN 500 MG PO CAPS: 500.0000 mg | ORAL_CAPSULE | Freq: Three times a day (TID) | ORAL | 0 refills | Status: AC

## 2017-09-24 MED ORDER — POTASSIUM CHLORIDE CRYS ER 20 MEQ PO TBCR
20.0000 meq | EXTENDED_RELEASE_TABLET | ORAL | Status: AC
  Administered 2017-09-24 (×2): 20 meq via ORAL
  Filled 2017-09-24 (×2): qty 1

## 2017-09-24 NOTE — Progress Notes (Signed)
Inpatient Diabetes Program Recommendations  AACE/ADA: New Consensus Statement on Inpatient Glycemic Control (2015)  Target Ranges:  Prepandial:   less than 140 mg/dL      Peak postprandial:   less than 180 mg/dL (1-2 hours)      Critically ill patients:  140 - 180 mg/dL   Lab Results  Component Value Date   GLUCAP 152 (H) 09/24/2017   HGBA1C 6.5 (H) 05/16/2016    Review of Glycemic Control  Diabetes history: DM2 Outpatient Diabetes medications: Not taking x 2 months Prescribed 70/30 insulin mix 15 units bid + Metformin 500 mg bid Current orders for Inpatient glycemic control: IV insulin-to transition to 70/30 insulin mix 12 units bid  per vial. Inpatient Diabetes Program Recommendations:    Spoke with RN Okey Regalarol and spoke with patient by phone. Patient states " I don't want to talk-I'm hungry" Patient spoke with DM coordinator briefly and said she had not taken her insulin for 2 months due to not having a prescription. Reviewed with patient to followup with her PCP ahead of time regarding need for prescriptions and gave her information about [facility] relion Novolin insulin available for $25. Also reviewed hypoglycemia protocol with patient and she acknowledged understanding.  When transitioning to SQ regimen, allow 1-2 hrs overlap. Add Novolog sensitive correction tid + hs 0-5 units  Thank you, Darel HongJudy E. Bowie Delia, RN, MSN, CDE  Diabetes Coordinator Inpatient Glycemic Control Team Team Pager [phone] (8am-5pm) 09/24/2017 8:06 AM

## 2017-09-24 NOTE — Progress Notes (Signed)
Patient A&O x4, VSS, DKA resolved per labs.  Patient transitioned to SQ insulin.  Discharge instructions reviewed with patient and prescriptions given. 2 PIVs removed and patient dressed.  Belongings returned to patient and discharged via wheelchair to visitor's entrance.

## 2017-09-24 NOTE — Discharge Summary (Signed)
[facility] [place] NAME: Pamela Lane    MR#:  [id number]  DATE OF BIRTH:  09/17/1960  DATE OF ADMISSION:  09/23/2017 ADMITTING PHYSICIAN: Amelia Jo, MD  DATE OF DISCHARGE: 09/24/2017  PRIMARY CARE PHYSICIAN: [facility] [place]    ADMISSION DIAGNOSIS:  Diabetic ketoacidosis without coma associated with other specified diabetes mellitus ([place]) [E13.10]  DISCHARGE DIAGNOSIS:  Active Problems:   DKA (diabetic ketoacidoses) ([place])   SECONDARY DIAGNOSIS:   Past Medical History:  Diagnosis Date  . Asthma   . Diabetes mellitus without complication ([place])   . Hypertension   . Mental disorder    pt reports 'I have all of them mental disorders'    HOSPITAL COURSE:   57 year old female with Diabetes and tobacco dependence who presents with elevated blood sugar.  1. DKA: This resolved. She will continue with outpatient medicications and ADA diet. She will need close follow up with her PCP.  2. Tobacco dependence: Patient is encouraged to quit smoking. Counseling was provided for 4 minutes.   3. UTI: She will be discharged on Keflex for 2 more days  4. Hx of mild intermittant ASTHMA without exacerbation.  5.Essential HTN: Continue Lisnopril  6. Hypokalemia: replaced    DISCHARGE CONDITIONS AND DIET:  Stable Diabetic diet  CONSULTS OBTAINED:  Treatment Team:  Pccm, [facility], MD  DRUG ALLERGIES:  No Known Allergies  DISCHARGE MEDICATIONS:   Allergies as of 09/24/2017   No Known Allergies     Medication List    STOP taking these medications   ondansetron 4 MG tablet Commonly known as:  ZOFRAN     TAKE these medications   blood glucose meter kit and supplies Kit Dispense based on patient and insurance preference. Use up to four times daily as directed. (FOR ICD-9 250.00, 250.01).   cephALEXin 500 MG capsule Commonly known as:  KEFLEX Take 1 capsule (500 mg total) by mouth 3 (three)  times daily for 2 days.   insulin NPH-regular Human (70-30) 100 UNIT/ML injection Commonly known as:  NOVOLIN 70/30 Inject 22 Units into the skin 2 (two) times daily with a meal. What changed:  how much to take   lisinopril 10 MG tablet Commonly known as:  PRINIVIL Take 1 tablet (10 mg total) by mouth daily.   metFORMIN 500 MG tablet Commonly known as:  GLUCOPHAGE Take 1 tablet (500 mg total) by mouth 2 (two) times daily with a meal.         Today   CHIEF COMPLAINT:  Doing well  Eating wel   ready for discahrge   VITAL SIGNS:  Blood pressure (!) 145/77, pulse 97, temperature 98.1 F (36.7 C), temperature source Oral, resp. rate (!) 25, height '5\' 3"'  (1.6 m), weight 102.5 kg (225 lb 15.5 oz), SpO2 98 %.   REVIEW OF SYSTEMS:  Review of Systems  Constitutional: Negative.  Negative for chills, fever and malaise/fatigue.  HENT: Negative.  Negative for ear discharge, ear pain, hearing loss, nosebleeds and sore throat.   Eyes: Negative.  Negative for blurred vision and pain.  Respiratory: Negative.  Negative for cough, hemoptysis, shortness of breath and wheezing.   Cardiovascular: Negative.  Negative for chest pain, palpitations and leg swelling.  Gastrointestinal: Negative.  Negative for abdominal pain, blood in stool, diarrhea, nausea and vomiting.  Genitourinary: Negative.  Negative for dysuria.  Musculoskeletal: Negative.  Negative for back pain.  Skin: Negative.   Neurological: Negative for dizziness,  tremors, speech change, focal weakness, seizures and headaches.  Endo/Heme/Allergies: Negative.  Does not bruise/bleed easily.  Psychiatric/Behavioral: Negative.  Negative for depression, hallucinations and suicidal ideas.     PHYSICAL EXAMINATION:  GENERAL:  57 y.o.-year-old patient lying in the bed with no acute distress.  NECK:  Supple, no jugular venous distention. No thyroid enlargement, no tenderness.  LUNGS: Normal breath sounds bilaterally, no wheezing,  rales,rhonchi  No use of accessory muscles of respiration.  CARDIOVASCULAR: S1, S2 normal. No murmurs, rubs, or gallops.  ABDOMEN: Soft, non-tender, non-distended. Bowel sounds present. No organomegaly or mass.  EXTREMITIES: No pedal edema, cyanosis, or clubbing.  PSYCHIATRIC: The patient is alert and oriented x 3.  SKIN: No obvious rash, lesion, or ulcer.   DATA REVIEW:   CBC Recent Labs  Lab 09/23/17 2031  WBC 12.0*  HGB 16.0  HCT 48.6*  PLT 260    Chemistries  Recent Labs  Lab 09/24/17 0420  NA 138  K 3.2*  CL 105  CO2 24  GLUCOSE 178*  BUN 14  CREATININE 0.76  CALCIUM 8.5*    Cardiac Enzymes No results for input(s): TROPONINI in the last 168 hours.  Microbiology Results  '@[id number]' @  RADIOLOGY:  No results found.    Allergies as of 09/24/2017   No Known Allergies     Medication List    STOP taking these medications   ondansetron 4 MG tablet Commonly known as:  ZOFRAN     TAKE these medications   blood glucose meter kit and supplies Kit Dispense based on patient and insurance preference. Use up to four times daily as directed. (FOR ICD-9 250.00, 250.01).   cephALEXin 500 MG capsule Commonly known as:  KEFLEX Take 1 capsule (500 mg total) by mouth 3 (three) times daily for 2 days.   insulin NPH-regular Human (70-30) 100 UNIT/ML injection Commonly known as:  NOVOLIN 70/30 Inject 22 Units into the skin 2 (two) times daily with a meal. What changed:  how much to take   lisinopril 10 MG tablet Commonly known as:  PRINIVIL Take 1 tablet (10 mg total) by mouth daily.   metFORMIN 500 MG tablet Commonly known as:  GLUCOPHAGE Take 1 tablet (500 mg total) by mouth 2 (two) times daily with a meal.           Management plans discussed with the patient and she is in agreement. Stable for discharge home  Patient should follow up with pcp  CODE STATUS:     Code Status Orders  (From admission, onward)        Start     Ordered    09/23/17 2253  Full code  Continuous     09/23/17 2252    Code Status History    Date Active Date Inactive Code Status Order ID Comments User Context   05/15/2016 17:45 05/16/2016 17:49 Full Code [id number]  Dustin Flock, MD Inpatient   01/13/2016 00:51 01/14/2016 13:14 Full Code [id number]  Norval Morton, MD ED   01/08/2016 19:16 01/09/2016 17:04 Full Code [id number]  Theodoro Grist, MD Inpatient      TOTAL TIME TAKING CARE OF THIS PATIENT: 38 minutes.    Note: This dictation was prepared with Dragon dictation along with smaller phrase technology. Any transcriptional errors that result from this process are unintentional.  Belen Pesch M.D on 09/24/2017 at 8:00 AM  Between 7am to 6pm - Pager - [phone] After 6pm go to [email or web address] - password [email or web address]  [employer]  Office  [phone]  CC: Primary care physician; [facility] [place]

## 2017-09-25 LAB — HIV ANTIBODY (ROUTINE TESTING W REFLEX): HIV Screen 4th Generation wRfx: NONREACTIVE

## 2017-10-03 ENCOUNTER — Telehealth: Payer: Self-pay

## 2017-10-03 NOTE — Care Management (Signed)
RNCM spoke with patient by phone following up on discharge from ICU/EMMI call. Patient states she has established with outside community to "stay busy" and "is doing quite well".  She says, "I've been taking me medicines like I'm supposes to and really in good spirits".  "Food still don't really have a taste to it but I'm doing good".  I encouraged patient to keep up the good work and reach back out to us by phone if needed. She states she has already talked with a Child psychotherapistsocial worker and has been linked to group support. She is "thankful for her care".  She said her hopelessness was all because of her illness but is doing much better now and staying active.  She thanked me for calling.  ICU CSW updated.

## 2017-10-15 ENCOUNTER — Other Ambulatory Visit: Payer: Self-pay

## 2017-10-15 ENCOUNTER — Emergency Department: Payer: Medicaid Other

## 2017-10-15 ENCOUNTER — Encounter: Payer: Self-pay | Admitting: Emergency Medicine

## 2017-10-15 ENCOUNTER — Inpatient Hospital Stay
Admission: EM | Admit: 2017-10-15 | Discharge: 2017-10-17 | DRG: 638 | Disposition: A | Discharge status: Home / Self Care | Payer: Medicaid Other | Attending: Internal Medicine | Admitting: Internal Medicine

## 2017-10-15 DIAGNOSIS — D72829 Elevated white blood cell count, unspecified: Secondary | ICD-10-CM | POA: Diagnosis present

## 2017-10-15 DIAGNOSIS — Z9114 Patient's other noncompliance with medication regimen: Secondary | ICD-10-CM

## 2017-10-15 DIAGNOSIS — E860 Dehydration: Secondary | ICD-10-CM | POA: Diagnosis present

## 2017-10-15 DIAGNOSIS — E669 Obesity, unspecified: Secondary | ICD-10-CM | POA: Diagnosis present

## 2017-10-15 DIAGNOSIS — F17210 Nicotine dependence, cigarettes, uncomplicated: Secondary | ICD-10-CM | POA: Diagnosis present

## 2017-10-15 DIAGNOSIS — Z6837 Body mass index (BMI) 37.0-37.9, adult: Secondary | ICD-10-CM | POA: Diagnosis not present

## 2017-10-15 DIAGNOSIS — E871 Hypo-osmolality and hyponatremia: Secondary | ICD-10-CM | POA: Diagnosis present

## 2017-10-15 DIAGNOSIS — N179 Acute kidney failure, unspecified: Secondary | ICD-10-CM | POA: Diagnosis present

## 2017-10-15 DIAGNOSIS — Z79899 Other long term (current) drug therapy: Secondary | ICD-10-CM

## 2017-10-15 DIAGNOSIS — Z794 Long term (current) use of insulin: Secondary | ICD-10-CM | POA: Diagnosis not present

## 2017-10-15 DIAGNOSIS — E1110 Type 2 diabetes mellitus with ketoacidosis without coma: Secondary | ICD-10-CM | POA: Diagnosis present

## 2017-10-15 DIAGNOSIS — I10 Essential (primary) hypertension: Secondary | ICD-10-CM | POA: Diagnosis present

## 2017-10-15 DIAGNOSIS — J45909 Unspecified asthma, uncomplicated: Secondary | ICD-10-CM | POA: Diagnosis present

## 2017-10-15 DIAGNOSIS — L304 Erythema intertrigo: Secondary | ICD-10-CM | POA: Diagnosis present

## 2017-10-15 DIAGNOSIS — Z791 Long term (current) use of non-steroidal anti-inflammatories (NSAID): Secondary | ICD-10-CM | POA: Diagnosis not present

## 2017-10-15 DIAGNOSIS — Z7982 Long term (current) use of aspirin: Secondary | ICD-10-CM | POA: Diagnosis not present

## 2017-10-15 DIAGNOSIS — Z7984 Long term (current) use of oral hypoglycemic drugs: Secondary | ICD-10-CM | POA: Diagnosis not present

## 2017-10-15 LAB — CBC WITH DIFFERENTIAL/PLATELET
BASOS ABS: 0.1 10*3/uL (ref 0–0.1)
Basophils Relative: 0 %
EOS PCT: 0 %
Eosinophils Absolute: 0 10*3/uL (ref 0–0.7)
HCT: 52.1 % — ABNORMAL HIGH (ref 35.0–47.0)
Hemoglobin: 17.1 g/dL — ABNORMAL HIGH (ref 12.0–16.0)
LYMPHS ABS: 1.1 10*3/uL (ref 1.0–3.6)
LYMPHS PCT: 9 %
MCH: 30.3 pg (ref 26.0–34.0)
MCHC: 32.7 g/dL (ref 32.0–36.0)
MCV: 92.6 fL (ref 80.0–100.0)
Monocytes Absolute: 0.7 10*3/uL (ref 0.2–0.9)
Monocytes Relative: 6 %
Neutro Abs: 10.4 10*3/uL — ABNORMAL HIGH (ref 1.4–6.5)
Neutrophils Relative %: 85 %
PLATELETS: 344 10*3/uL (ref 150–440)
RBC: 5.63 MIL/uL — ABNORMAL HIGH (ref 3.80–5.20)
RDW: 13.9 % (ref 11.5–14.5)
WBC: 12.2 10*3/uL — ABNORMAL HIGH (ref 3.6–11.0)

## 2017-10-15 LAB — COMPREHENSIVE METABOLIC PANEL
ALBUMIN: 4.9 g/dL (ref 3.5–5.0)
ALK PHOS: 114 U/L (ref 38–126)
ALT: 22 U/L (ref 14–54)
ALT: 21 U/L (ref 14–54)
AST: 16 U/L (ref 15–41)
AST: 18 U/L (ref 15–41)
Albumin: 4.9 g/dL (ref 3.5–5.0)
Alkaline Phosphatase: 110 U/L (ref 38–126)
Anion gap: 25 — ABNORMAL HIGH (ref 5–15)
Anion gap: 16 — ABNORMAL HIGH (ref 5–15)
BILIRUBIN TOTAL: 1.1 mg/dL (ref 0.3–1.2)
BUN: 19 mg/dL (ref 6–20)
BUN: 18 mg/dL (ref 6–20)
CHLORIDE: 92 mmol/L — AB (ref 101–111)
CO2: 13 mmol/L — ABNORMAL LOW (ref 22–32)
CO2: 18 mmol/L — AB (ref 22–32)
Calcium: 9.7 mg/dL (ref 8.9–10.3)
Calcium: 9.8 mg/dL (ref 8.9–10.3)
Chloride: 107 mmol/L (ref 101–111)
Creatinine, Ser: 1.53 mg/dL — ABNORMAL HIGH (ref 0.44–1.00)
Creatinine, Ser: 1.16 mg/dL — ABNORMAL HIGH (ref 0.44–1.00)
GFR calc Af Amer: 60 mL/min — ABNORMAL LOW (ref >60)
GFR calc non Af Amer: 52 mL/min — ABNORMAL LOW (ref >60)
GFR, EST AFRICAN AMERICAN: 43 mL/min — AB (ref >60)
GFR, EST NON AFRICAN AMERICAN: 37 mL/min — AB (ref >60)
GLUCOSE: 171 mg/dL — AB (ref 65–99)
Glucose, Bld: 735 mg/dL (ref 65–99)
POTASSIUM: 4.5 mmol/L (ref 3.5–5.1)
POTASSIUM: 3.8 mmol/L (ref 3.5–5.1)
Sodium: 130 mmol/L — ABNORMAL LOW (ref 135–145)
Sodium: 141 mmol/L (ref 135–145)
TOTAL PROTEIN: 9 g/dL — AB (ref 6.5–8.1)
Total Bilirubin: 1.7 mg/dL — ABNORMAL HIGH (ref 0.3–1.2)
Total Protein: 8.8 g/dL — ABNORMAL HIGH (ref 6.5–8.1)

## 2017-10-15 LAB — BASIC METABOLIC PANEL
ANION GAP: 14 (ref 5–15)
Anion gap: 15 (ref 5–15)
BUN: 20 mg/dL (ref 6–20)
BUN: 21 mg/dL — ABNORMAL HIGH (ref 6–20)
CHLORIDE: 101 mmol/L (ref 101–111)
CHLORIDE: 99 mmol/L — AB (ref 101–111)
CO2: 18 mmol/L — AB (ref 22–32)
CO2: 18 mmol/L — AB (ref 22–32)
Calcium: 9.2 mg/dL (ref 8.9–10.3)
Calcium: 8.9 mg/dL (ref 8.9–10.3)
Creatinine, Ser: 1.3 mg/dL — ABNORMAL HIGH (ref 0.44–1.00)
Creatinine, Ser: 1.46 mg/dL — ABNORMAL HIGH (ref 0.44–1.00)
GFR calc Af Amer: 52 mL/min — ABNORMAL LOW (ref >60)
GFR calc non Af Amer: 39 mL/min — ABNORMAL LOW (ref >60)
GFR, EST AFRICAN AMERICAN: 45 mL/min — AB (ref >60)
GFR, EST NON AFRICAN AMERICAN: 45 mL/min — AB (ref >60)
GLUCOSE: 333 mg/dL — AB (ref 65–99)
Glucose, Bld: 400 mg/dL — ABNORMAL HIGH (ref 65–99)
POTASSIUM: 3.9 mmol/L (ref 3.5–5.1)
Potassium: 4.1 mmol/L (ref 3.5–5.1)
Sodium: 134 mmol/L — ABNORMAL LOW (ref 135–145)
Sodium: 131 mmol/L — ABNORMAL LOW (ref 135–145)

## 2017-10-15 LAB — GLUCOSE, CAPILLARY
GLUCOSE-CAPILLARY: 360 mg/dL — AB (ref 65–99)
GLUCOSE-CAPILLARY: 409 mg/dL — AB (ref 65–99)
GLUCOSE-CAPILLARY: 283 mg/dL — AB (ref 65–99)
Glucose-Capillary: 547 mg/dL (ref 65–99)
Glucose-Capillary: 58 mg/dL — ABNORMAL LOW (ref 65–99)
Glucose-Capillary: 208 mg/dL — ABNORMAL HIGH (ref 65–99)

## 2017-10-15 LAB — BETA-HYDROXYBUTYRIC ACID: Beta-Hydroxybutyric Acid: 7.72 mmol/L — ABNORMAL HIGH (ref 0.05–0.27)

## 2017-10-15 LAB — TROPONIN I

## 2017-10-15 LAB — LIPASE, BLOOD: LIPASE: 82 U/L — AB (ref 11–51)

## 2017-10-15 MED ORDER — INSULIN GLARGINE 100 UNIT/ML ~~LOC~~ SOLN
10.0000 [IU] | SUBCUTANEOUS | Status: DC
  Administered 2017-10-15: 10 [IU] via SUBCUTANEOUS
  Filled 2017-10-15 (×2): qty 0.1

## 2017-10-15 MED ORDER — INSULIN ASPART 100 UNIT/ML ~~LOC~~ SOLN
10.0000 [IU] | Freq: Once | SUBCUTANEOUS | Status: AC
  Administered 2017-10-15: 10 [IU] via INTRAVENOUS

## 2017-10-15 MED ORDER — ONDANSETRON HCL 4 MG/2ML IJ SOLN
4.0000 mg | Freq: Once | INTRAMUSCULAR | Status: AC
  Administered 2017-10-15: 4 mg via INTRAVENOUS
  Filled 2017-10-15: qty 2

## 2017-10-15 MED ORDER — MORPHINE SULFATE (PF) 4 MG/ML IV SOLN
4.0000 mg | Freq: Once | INTRAVENOUS | Status: AC
  Administered 2017-10-15: 4 mg via INTRAVENOUS
  Filled 2017-10-15: qty 1

## 2017-10-15 MED ORDER — METOPROLOL TARTRATE 5 MG/5ML IV SOLN
5.0000 mg | Freq: Once | INTRAVENOUS | Status: AC
  Administered 2017-10-15: 5 mg via INTRAVENOUS
  Filled 2017-10-15: qty 5

## 2017-10-15 MED ORDER — SODIUM CHLORIDE 0.9 % IV SOLN
INTRAVENOUS | Status: DC
  Administered 2017-10-15: 5.4 [IU]/h via INTRAVENOUS
  Filled 2017-10-15: qty 1

## 2017-10-15 MED ORDER — DEXTROSE 5 % IV BOLUS
1000.0000 mL | Freq: Once | INTRAVENOUS | Status: AC
  Administered 2017-10-15: 1000 mL via INTRAVENOUS

## 2017-10-15 MED ORDER — INSULIN ASPART 100 UNIT/ML ~~LOC~~ SOLN
0.0000 [IU] | Freq: Three times a day (TID) | SUBCUTANEOUS | Status: DC
  Administered 2017-10-16: 15 [IU] via SUBCUTANEOUS
  Administered 2017-10-16 (×2): 5 [IU] via SUBCUTANEOUS
  Administered 2017-10-17: 11 [IU] via SUBCUTANEOUS
  Filled 2017-10-15 (×5): qty 1

## 2017-10-15 MED ORDER — METOCLOPRAMIDE HCL 5 MG/ML IJ SOLN
10.0000 mg | Freq: Once | INTRAMUSCULAR | Status: AC
  Administered 2017-10-15: 10 mg via INTRAVENOUS
  Filled 2017-10-15: qty 2

## 2017-10-15 MED ORDER — SODIUM CHLORIDE 0.9 % IV SOLN
1000.0000 mL | Freq: Once | INTRAVENOUS | Status: AC
  Administered 2017-10-15: 1000 mL via INTRAVENOUS

## 2017-10-15 MED ORDER — LORAZEPAM 2 MG/ML IJ SOLN
1.0000 mg | Freq: Once | INTRAMUSCULAR | Status: AC
  Administered 2017-10-15: 1 mg via INTRAVENOUS
  Filled 2017-10-15: qty 1

## 2017-10-15 MED ORDER — INSULIN ASPART 100 UNIT/ML ~~LOC~~ SOLN: 6.0000 [IU] | Freq: Three times a day (TID) | SUBCUTANEOUS | Status: DC

## 2017-10-15 MED ORDER — KCL IN DEXTROSE-NACL 20-5-0.9 MEQ/L-%-% IV SOLN
INTRAVENOUS | Status: DC
  Administered 2017-10-15: 20:00:00 via INTRAVENOUS
  Filled 2017-10-15 (×3): qty 1000

## 2017-10-15 MED ORDER — HYDROCOD POLST-CPM POLST ER 10-8 MG/5ML PO SUER
5.0000 mL | Freq: Once | ORAL | Status: AC
  Administered 2017-10-15: 5 mL via ORAL
  Filled 2017-10-15: qty 5

## 2017-10-15 NOTE — ED Notes (Signed)
CBG 58 when checked by this nurse.  Dr. Allena KatzPatel informed, patient given apple juice, insulin drip paused at this time, and will recheck CBG in approx. 45 minutes per Dr. Elsie LincolnPatel VORB.

## 2017-10-15 NOTE — Progress Notes (Signed)
Patient ID: Pamela MoraleMary G Lane, female   DOB: 01/05/1961, 57 y.o.   MRN: [id number]  Patient's sugars are much improved. Sugars are down from 735 to 170s. She is continued on insulin drip and IV fluids. Anion gap is down to 16. Will repeat on the metabolic panel around 630. Once anion gap closes will DC insulin drip and put her on home does insulin. His coordinator consultation placed.

## 2017-10-15 NOTE — H&P (Addendum)
[facility] - [place] [facility]   PATIENT NAME: Pamela Lane    MR#:  [id number]  DATE OF BIRTH:  11/09/1960  DATE OF ADMISSION:  10/15/2017  PRIMARY CARE PHYSICIAN: [facility]   REQUESTING/REFERRING PHYSICIAN: Dr Williams  CHIEF COMPLAINT:   Uncontrolled sugars  HISTORY OF PRESENT ILLNESS:  Pamela Lane  is a 57 y.o. female with a known history of diabetes on insulin, hypertension comes to the emergency room with uncontrolled sugars. Patient was found to be in DKA with anion gap of 25. Patient appears dehydrated getting IV fluids and started on insulin drip. She is being admitted for DKA.  PAST MEDICAL HISTORY:   Past Medical History:  Diagnosis Date  . Asthma   . Diabetes mellitus without complication (HCC)   . Hypertension   . Mental disorder    pt reports 'I have all of them mental disorders'    PAST SURGICAL HISTOIRY:   Past Surgical History:  Procedure Laterality Date  . ABDOMINAL HYSTERECTOMY      SOCIAL HISTORY:   Social History   Tobacco Use  . Smoking status: Current Every Day Smoker    Packs/day: 3.00    Types: Cigarettes  . Smokeless tobacco: Never Used  Substance Use Topics  . Alcohol use: Yes    Alcohol/week: 24.0 oz    Types: 40 Cans of beer per week    Comment: 3 big bottles of liquor    FAMILY HISTORY:   Family History  Problem Relation Age of Onset  . Hypertension Mother     DRUG ALLERGIES:  No Known Allergies  REVIEW OF SYSTEMS:  Review of Systems  Unable to perform ROS: Medical condition     MEDICATIONS AT HOME:   Prior to Admission medications   Medication Sig Start Date End Date Taking? Authorizing Provider  albuterol (PROVENTIL HFA;VENTOLIN HFA) 108 (90 Base) MCG/ACT inhaler Inhale 2 puffs into the lungs every 6 (six) hours as needed for wheezing or shortness of breath.   Yes [provider]  aspirin EC 81 MG tablet Take 81 mg by mouth daily.   Yes  [provider]  buPROPion (WELLBUTRIN SR) 150 MG 12 hr tablet Take 150 mg by mouth 2 (two) times daily.   Yes [provider]  insulin NPH-regular Human (NOVOLIN 70/30) (70-30) 100 UNIT/ML injection Inject 22 Units into the skin 2 (two) times daily with a meal. 09/24/17 11/23/17 Yes Mody, Sital, MD  lisinopril (PRINIVIL) 10 MG tablet Take 1 tablet (10 mg total) by mouth daily. 04/24/17  Yes Quale, Mark, MD  metFORMIN (GLUCOPHAGE) 500 MG tablet Take 1 tablet (500 mg total) by mouth 2 (two) times daily with a meal. 09/24/17  Yes Mody, Sital, MD  naproxen (NAPROSYN) 500 MG tablet Take 500 mg by mouth 2 (two) times daily with a meal.   Yes [provider]  blood glucose meter kit and supplies KIT Dispense based on patient and insurance preference. Use up to four times daily as directed. (FOR ICD-9 250.00, 250.01). 01/05/16   Gayle, Eryka A, MD      VITAL SIGNS:  Blood pressure (!) 132/94, pulse (!) 116, temperature 98.4 F (36.9 C), temperature source Oral, resp. rate (!) 22, height 5' 3" (1.6 m), weight 102.1 kg (225 lb), SpO2 95 %.  PHYSICAL EXAMINATION:  GENERAL:  57 y.o.-year-old patient lying in the bed with no acute distress. obese EYES: Pupils equal, round, reactive to light and accommodation. No scleral   icterus. Extraocular muscles intact.  HEENT: Head atraumatic, normocephalic. Oropharynx and nasopharynx clear. Oral mucosa dry NECK:  Supple, no jugular venous distention. No thyroid enlargement, no tenderness.  LUNGS: Normal breath sounds bilaterally, no wheezing, rales,rhonchi or crepitation. No use of accessory muscles of respiration.  CARDIOVASCULAR: S1, S2 normal. No murmurs, rubs, or gallops. tachycardia ABDOMEN: Soft, nontender, nondistended. Bowel sounds present. No organomegaly or mass.  EXTREMITIES: No pedal edema, cyanosis, or clubbing.  NEUROLOGIC: quite lethargic. She moves all her extremities spontaneously. Limited exam PSYCHIATRIC:lethargic falls back  to sleep. Answers to basic questions. SKIN: No obvious rash, lesion, or ulcer.   LABORATORY PANEL:   CBC Recent Labs  Lab 10/15/17 0900  WBC 12.2*  HGB 17.1*  HCT 52.1*  PLT 344   ------------------------------------------------------------------------------------------------------------------  Chemistries  Recent Labs  Lab 10/15/17 0900  NA 130*  K 4.5  CL 92*  CO2 13*  GLUCOSE 735*  BUN 19  CREATININE 1.53*  CALCIUM 9.7  AST 16  ALT 22  ALKPHOS 110  BILITOT 1.7*   ------------------------------------------------------------------------------------------------------------------  Cardiac Enzymes Recent Labs  Lab 10/15/17 0900  TROPONINI <0.03   ------------------------------------------------------------------------------------------------------------------  RADIOLOGY:  Dg Chest 1 View  Result Date: 10/15/2017 CLINICAL DATA:  Cough and nausea/vomiting over the past several days. Current history of asthma, diabetes and hypertension. Current 3 pack per day smoker. EXAM: Portable CHEST 1 VIEW COMPARISON:  04/24/2017, 01/12/2016 and earlier. FINDINGS: Cardiomediastinal silhouette unremarkable, unchanged. Emphysematous changes are suspected in the upper lobes. Lungs clear. Bronchovascular markings normal. Pulmonary vascularity normal. No visible pleural effusions. No pneumothorax. 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  No acute cardiopulmonary disease. Electronically Signed   By: Thomas  Lawrence M.D.   On: 10/15/2017 08:51    EKG:    IMPRESSION AND PLAN:   Pamela Lane  is a 57 y.o. female with a known history of diabetes on insulin, hypertension comes to the emergency room with uncontrolled sugars. Patient was found to be in DKA with anion gap of 25.  1. DKA and type II diabetes -admit to step down CCU -IV fluids, IV insulin drip, metabolic panel every eight hours -with ICU attending Dr. Conforti  2. renal failure in the setting of 1. -IV hydration, input  output, avoid nephrotoxins  3. Pseudo-hyponatremia secondary to elevated sugars  4.  leukocytosis with elevated H&H appears him consultation secondary dehydration from DKA -should get better with IV fluids  5. Discussed with patient's boyfriend  Critically ill  6. DVT prophylaxis subcu Lovenox  All the records are reviewed and case discussed with ED provider. Management plans discussed with the patient, family and they are in agreement.  CODE STATUS: lovenox TOTAL  Critical TIME TAKING CARE OF THIS PATIENT: *50* minutes.      M.D on 10/15/2017 at 12:44 PM  Between 7am to 6pm - Pager - [phone]  After 6pm go to [email or web address] - password [email or web address]  [facility]  Office  [phone]  CC: Primary care physician; [facility]    

## 2017-10-15 NOTE — ED Notes (Signed)
[id number] 

## 2017-10-15 NOTE — ED Notes (Signed)
Pt insulin drip rate change to 9.7 according to glucostabilizer.  RN will monitor.

## 2017-10-15 NOTE — Progress Notes (Signed)
Patient's anion gap is at 15, no CC or stepdown beds available.  We will continue  hydration with IV fluids and recheck BMP in 2 hours.  If anion gap is  closed and bicarb in the normal range will transfer patient to floor

## 2017-10-15 NOTE — Consult Note (Signed)
[facility] Consultation   ASSESSMENT/PLAN   DKA secondary to noncompliance with insulin therapy. No clear evidence of sepsis, EKG does not reveal any ischemic changes.Complicating issues include a sodium 1:30 which will improve with glucose reduction. Acidosis noted with an anion gap of 25, delta gap of 13, calculated starting bicarbonate 26. With appropriate respiratory compensation consistent with a simple anion gap metabolic acidosis secondary to DKA. She will be placed on protocol to adjust for electrolyte abnormalities to include magnesium and potassium. Case management will need to be involved along with diabetic teaching so this does not recur   Name: Pamela Lane MRN: [id number] DOB: 06-26-1961    ADMISSION DATE:  10/15/2017 CONSULTATION DATE:  10/15/2017  REFERRING MD :  Dr. Posey Pronto  CHIEF COMPLAINT:  Nausea and abdominal pain   HISTORY OF PRESENT ILLNESS:  Pamela Lane is a 57 year old African-American female with a past medical history remarkable for hypertension, psychiatric disorder, asthma, diabetes mellitus who has stopped taking insulin for the last month. She presented with complaints of thirst, polyuria, nausea, abdominal pain, admit labs reveal a glucose of 735, sodium of 130, CO2 of 19, white count of 12.2, anion gap of 25. She was started on insulin infusion, fluid resuscitation and will be transferred to the intensive care unit for treatment of DKA secondary to noncompliance  PAST MEDICAL HISTORY :  Past Medical History:  Diagnosis Date  . Asthma   . Diabetes mellitus without complication ([place])   . Hypertension   . Mental disorder    pt reports 'I have all of them mental disorders'   Past Surgical History:  Procedure Laterality Date  . ABDOMINAL HYSTERECTOMY     Prior to Admission medications   Medication Sig Start Date End Date Taking? Authorizing Provider  albuterol (PROVENTIL HFA;VENTOLIN HFA) 108 (90 Base) MCG/ACT inhaler  Inhale 2 puffs into the lungs every 6 (six) hours as needed for wheezing or shortness of breath.   Yes [provider]  aspirin EC 81 MG tablet Take 81 mg by mouth daily.   Yes [provider]  buPROPion (WELLBUTRIN SR) 150 MG 12 hr tablet Take 150 mg by mouth 2 (two) times daily.   Yes [provider]  insulin NPH-regular Human (NOVOLIN 70/30) (70-30) 100 UNIT/ML injection Inject 22 Units into the skin 2 (two) times daily with a meal. 09/24/17 11/23/17 Yes Mody, Sital, MD  lisinopril (PRINIVIL) 10 MG tablet Take 1 tablet (10 mg total) by mouth daily. 04/24/17  Yes Delman Kitten, MD  metFORMIN (GLUCOPHAGE) 500 MG tablet Take 1 tablet (500 mg total) by mouth 2 (two) times daily with a meal. 09/24/17  Yes Mody, Sital, MD  naproxen (NAPROSYN) 500 MG tablet Take 500 mg by mouth 2 (two) times daily with a meal.   Yes [provider]  blood glucose meter kit and supplies KIT Dispense based on patient and insurance preference. Use up to four times daily as directed. (FOR ICD-9 250.00, 250.01). 01/05/16   Joanne Gavel, MD   No Known Allergies  FAMILY HISTORY:  Family History  Problem Relation Age of Onset  . Hypertension Mother    SOCIAL HISTORY:  reports that she has been smoking cigarettes.  She has been smoking about 3.00 packs per day. She has never used smokeless tobacco. She reports that she drinks about 24.0 oz of alcohol per week. She reports that she has current or past drug history. Drugs: Marijuana and Cocaine.  REVIEW OF SYSTEMS:  The remainder of systems were reviewed and were found to be negative other than what is documented in the HPI.   VITAL SIGNS: Temp:  [98.4 F (36.9 C)] 98.4 F (36.9 C) (04/06 0843) Pulse Rate:  [116-123] 116 (04/06 0900) Resp:  [20-22] 22 (04/06 0900) BP: (132-137)/(94-95) 132/94 (04/06 0900) SpO2:  [95 %-98 %] 95 % (04/06 0900) Weight:  [102.1 kg (225 lb)] 102.1 kg (225 lb) (04/06 0844) HEMODYNAMICS:   INTAKE /  OUTPUT: No intake or output data in the 24 hours ending 10/15/17 1144  Physical Examination:   VS: BP (!) 132/94   Pulse (!) 116   Temp 98.4 F (36.9 C) (Oral)   Resp (!) 22   Ht '5\' 3"'  (1.6 m)   Wt 102.1 kg (225 lb)   SpO2 95%   BMI 39.86 kg/m   General Appearance: patient is sleepy but responsive to questioning. States she is feeling somewhat better Neuro:without focal findings, mental status, speech normal,. HEENT: PERRLA, EOM intact, no ptosis, no other lesions noticed;  Pulmonary: normal breath sounds., diaphragmatic excursion normal. CardiovascularNormal S1,S2.  No m/r/g.    Abdomen: Benign, Soft, non-tender, No masses, hepatosplenomegaly, No lymphadenopathy Skin:   warm, no rashes, no ecchymosis  Extremities: normal, no cyanosis, clubbing, no edema, warm with normal capillary refill.    LABS: Reviewed   LABORATORY PANEL:   CBC Recent Labs  Lab 10/15/17 0900  WBC 12.2*  HGB 17.1*  HCT 52.1*  PLT 344    Chemistries  Recent Labs  Lab 10/15/17 0900  NA 130*  K 4.5  CL 92*  CO2 13*  GLUCOSE 735*  BUN 19  CREATININE 1.53*  CALCIUM 9.7  AST 16  ALT 22  ALKPHOS 110  BILITOT 1.7*    No results for input(s): GLUCAP in the last 168 hours. No results for input(s): PHART, PCO2ART, PO2ART in the last 168 hours. Recent Labs  Lab 10/15/17 0900  AST 16  ALT 22  ALKPHOS 110  BILITOT 1.7*  ALBUMIN 4.9    Cardiac Enzymes Recent Labs  Lab 10/15/17 0900  TROPONINI <0.03    RADIOLOGY:  Dg Chest 1 View  Result Date: 10/15/2017 CLINICAL DATA:  Cough and nausea/vomiting over the past several days. Current history of asthma, diabetes and hypertension. Current 3 pack per day smoker. EXAM: Portable CHEST 1 VIEW COMPARISON:  04/24/2017, 01/12/2016 and earlier. FINDINGS: Cardiomediastinal silhouette unremarkable, unchanged. Emphysematous changes are suspected in the upper lobes. Lungs clear. Bronchovascular markings normal. Pulmonary vascularity normal. No  visible pleural effusions. No pneumothorax. 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  No acute cardiopulmonary disease. Electronically Signed   By: Evangeline Dakin M.D.   On: 10/15/2017 08:51    Hermelinda Dellen, DO  10/15/2017, 11:44 AM

## 2017-10-15 NOTE — ED Triage Notes (Signed)
Pt presents to ER from home via [facility] with complaints of N/V/ and hyperglycemia for past 3 days. Not been able to eat well. Pt reports chest pain for past 3 days.

## 2017-10-15 NOTE — ED Notes (Signed)
This RN Brock Raassandra  Lucion Dilger received call from Lab stating the need for a redraw. Redraw completed NOW. RN will continue to monitor.

## 2017-10-15 NOTE — ED Provider Notes (Signed)
[facility] Emergency Department Provider Note       Time seen: ----------------------------------------- 8:30 AM on 10/15/2017 -----------------------------------------   I have reviewed the triage vital signs and the nursing notes.  HISTORY   Chief Complaint No chief complaint on file.    HPI Pamela Lane is a 57 y.o. female with a history of asthma, diabetes, hypertension, pancreatitis, DKA who presents to the ED for hyperglycemia.  Patient states she has not been feeling well for several days, has no glucometer at home to check her blood sugar.  Prehospital blood sugar was over 500.  She denies fevers or chills, has had persistent cough and posttussive emesis.  She is brought in coughing and gagging on arrival.  She is complaining of diffuse weakness and pain.  Nothing makes her symptoms better.  Past Medical History:  Diagnosis Date  . Asthma   . Diabetes mellitus without complication (HCC)   . Hypertension   . Mental disorder    pt reports 'I have all of them mental disorders'    Patient Active Problem List   Diagnosis Date Noted  . Acute pancreatitis 05/15/2016  . AKI (acute kidney injury) (HCC)   . Diabetic ketoacidosis without coma associated with type 2 diabetes mellitus (HCC)   . Hyperglycemia due to type 2 diabetes mellitus (HCC) 01/13/2016  . UTI (urinary tract infection) 01/13/2016  . DKA (diabetic ketoacidoses) (HCC) 01/08/2016  . Hyponatremia 01/08/2016  . Dehydration 01/08/2016  . Urinary tract infection 01/08/2016  . Upper abdominal pain 01/08/2016  . Nausea & vomiting 01/08/2016    Past Surgical History:  Procedure Laterality Date  . ABDOMINAL HYSTERECTOMY      Allergies Patient has no known allergies.  Social History Social History   Tobacco Use  . Smoking status: Current Every Day Smoker    Packs/day: 3.00    Types: Cigarettes  . Smokeless tobacco: Never Used  Substance Use Topics  . Alcohol use: Yes   Alcohol/week: 24.0 oz    Types: 40 Cans of beer per week    Comment: 3 big bottles of liquor  . Drug use: Yes    Types: Marijuana, Cocaine   Review of Systems Constitutional: Negative for fever. Cardiovascular: Negative for chest pain. Respiratory: Negative for shortness of breath.  Positive for cough Gastrointestinal: Negative for abdominal pain, positive for vomiting Musculoskeletal: Negative for back pain. Skin: Negative for rash. Neurological: Negative for headaches, focal weakness or numbness.  All systems negative/normal/unremarkable except as stated in the HPI  ____________________________________________   PHYSICAL EXAM:  VITAL SIGNS: ED Triage Vitals  Enc Vitals Group     BP      Pulse      Resp      Temp      Temp src      SpO2      Weight      Height      Head Circumference      Peak Flow      Pain Score      Pain Loc      Pain Edu?      Excl. in GC?    Constitutional: Alert and oriented.  Mild distress Eyes: Conjunctivae are normal. Normal extraocular movements. ENT   Head: Normocephalic and atraumatic.   Nose: No congestion/rhinnorhea.   Mouth/Throat: Mucous membranes are moist.   Neck: No stridor. Cardiovascular: Normal rate, regular rhythm. No murmurs, rubs, or gallops. Respiratory: Normal respiratory effort without tachypnea nor retractions. Breath sounds are  clear and equal bilaterally.  Positive for persistent coughing Gastrointestinal: Soft and nontender. Normal bowel sounds Musculoskeletal: Nontender with normal range of motion in extremities. No lower extremity tenderness nor edema. Neurologic:  Normal speech and language. No gross focal neurologic deficits are appreciated.  Skin:  Skin is warm, dry and intact. No rash noted. Psychiatric: Mood and affect are normal. Speech and behavior are normal.  ____________________________________________  EKG: Interpreted by me.  Sinus tachycardia with a rate of 110 bpm, PAC, borderline  long QT, normal axis  ____________________________________________  ED COURSE:  As part of my medical decision making, I reviewed the following data within the electronic MEDICAL RECORD NUMBER History obtained from family if available, nursing notes, old chart and ekg, as well as notes from prior ED visits. Patient presented for cough, vomiting and hyperglycemia with weakness, we will assess with labs and imaging as indicated at this time.   Procedures ____________________________________________   LABS (pertinent positives/negatives)  Labs Reviewed  CBC WITH DIFFERENTIAL/PLATELET - Abnormal; Notable for the following components:      Result Value   WBC 12.2 (*)    RBC 5.63 (*)    Hemoglobin 17.1 (*)    HCT 52.1 (*)    Neutro Abs 10.4 (*)    All other components within normal limits  COMPREHENSIVE METABOLIC PANEL - Abnormal; Notable for the following components:   Sodium 130 (*)    Chloride 92 (*)    CO2 13 (*)    Glucose, Bld 735 (*)    Creatinine, Ser 1.53 (*)    Total Protein 8.8 (*)    Total Bilirubin 1.7 (*)    GFR calc non Af Amer 37 (*)    GFR calc Af Amer 43 (*)    Anion gap 25 (*)    All other components within normal limits  BLOOD GAS, VENOUS - Abnormal; Notable for the following components:   pCO2, Ven 27 (*)    pO2, Ven 78.0 (*)    Bicarbonate 13.3 (*)    Acid-base deficit 11.1 (*)    All other components within normal limits  TROPONIN I  URINALYSIS, COMPLETE (UACMP) WITH MICROSCOPIC  BETA-HYDROXYBUTYRIC ACID  CBG MONITORING, ED   CRITICAL CARE Performed by: Ulice DashJohnathan E Williams   Total critical care time: 30 minutes  Critical care time was exclusive of separately billable procedures and treating other patients.  Critical care was necessary to treat or prevent imminent or life-threatening deterioration.  Critical care was time spent personally by me on the following activities: development of treatment plan with patient and/or surrogate as well as  nursing, discussions with consultants, evaluation of patient's response to treatment, examination of patient, obtaining history from patient or surrogate, ordering and performing treatments and interventions, ordering and review of laboratory studies, ordering and review of radiographic studies, pulse oximetry and re-evaluation of patient's condition.  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  No acute cardiopulmonary disease.  ____________________________________________  DIFFERENTIAL DIAGNOSIS   Dehydration, electrolyte abnormality, DKA, HH NK, renal failure  FINAL ASSESSMENT AND PLAN  DKA   Plan: The patient had presented for persistent hyperglycemia. Patient's labs do indicate that she is likely in DKA.  Started her on IV fluids and ordered an insulin drip. Patient's imaging was negative for any acute process.  It is unclear as to why she is in DKA, some of this may be related to noncompliance.  She also has had vomiting which may indicate some gastroparesis for  which she was given Reglan and she has anxiety as well.  I will discussed with the hospitalist for admission.   Ulice Dash, MD   Note: This note was generated in part or whole with voice recognition software. Voice recognition is usually quite accurate but there are transcription errors that can and very often do occur. I apologize for any typographical errors that were not detected and corrected.     Emily Filbert, MD 10/15/17 [phone]

## 2017-10-16 LAB — BASIC METABOLIC PANEL
ANION GAP: 10 (ref 5–15)
ANION GAP: 12 (ref 5–15)
Anion gap: 11 (ref 5–15)
Anion gap: 8 (ref 5–15)
BUN: 20 mg/dL (ref 6–20)
BUN: 14 mg/dL (ref 6–20)
BUN: 18 mg/dL (ref 6–20)
BUN: 20 mg/dL (ref 6–20)
CALCIUM: 8.3 mg/dL — AB (ref 8.9–10.3)
CALCIUM: 8.6 mg/dL — AB (ref 8.9–10.3)
CALCIUM: 8.8 mg/dL — AB (ref 8.9–10.3)
CALCIUM: 8.8 mg/dL — AB (ref 8.9–10.3)
CHLORIDE: 101 mmol/L (ref 101–111)
CO2: 23 mmol/L (ref 22–32)
CO2: 20 mmol/L — ABNORMAL LOW (ref 22–32)
CO2: 23 mmol/L (ref 22–32)
CO2: 21 mmol/L — AB (ref 22–32)
CREATININE: 0.97 mg/dL (ref 0.44–1.00)
CREATININE: 1.05 mg/dL — AB (ref 0.44–1.00)
CREATININE: 1.03 mg/dL — AB (ref 0.44–1.00)
Chloride: 104 mmol/L (ref 101–111)
Chloride: 101 mmol/L (ref 101–111)
Chloride: 101 mmol/L (ref 101–111)
Creatinine, Ser: 1.07 mg/dL — ABNORMAL HIGH (ref 0.44–1.00)
GFR calc Af Amer: >60 mL/min (ref >60)
GFR calc non Af Amer: >60 mL/min (ref >60)
GFR, EST NON AFRICAN AMERICAN: 58 mL/min — AB (ref >60)
GFR, EST NON AFRICAN AMERICAN: 57 mL/min — AB (ref >60)
GFR, EST NON AFRICAN AMERICAN: 60 mL/min — AB (ref >60)
Glucose, Bld: 248 mg/dL — ABNORMAL HIGH (ref 65–99)
Glucose, Bld: 234 mg/dL — ABNORMAL HIGH (ref 65–99)
Glucose, Bld: 309 mg/dL — ABNORMAL HIGH (ref 65–99)
Glucose, Bld: 309 mg/dL — ABNORMAL HIGH (ref 65–99)
Potassium: 3.6 mmol/L (ref 3.5–5.1)
Potassium: 3.4 mmol/L — ABNORMAL LOW (ref 3.5–5.1)
Potassium: 3.8 mmol/L (ref 3.5–5.1)
Potassium: 3.4 mmol/L — ABNORMAL LOW (ref 3.5–5.1)
SODIUM: 132 mmol/L — AB (ref 135–145)
SODIUM: 132 mmol/L — AB (ref 135–145)
SODIUM: 134 mmol/L — AB (ref 135–145)
SODIUM: 137 mmol/L (ref 135–145)

## 2017-10-16 LAB — CBC
HCT: 45.4 % (ref 35.0–47.0)
Hemoglobin: 15.6 g/dL (ref 12.0–16.0)
MCH: 31 pg (ref 26.0–34.0)
MCHC: 34.3 g/dL (ref 32.0–36.0)
MCV: 90.5 fL (ref 80.0–100.0)
PLATELETS: 253 10*3/uL (ref 150–440)
RBC: 5.01 MIL/uL (ref 3.80–5.20)
RDW: 13.4 % (ref 11.5–14.5)
WBC: 10.5 10*3/uL (ref 3.6–11.0)

## 2017-10-16 LAB — URINALYSIS, COMPLETE (UACMP) WITH MICROSCOPIC
BILIRUBIN URINE: NEGATIVE
HGB URINE DIPSTICK: NEGATIVE
KETONES UR: 80 mg/dL — AB
Nitrite: NEGATIVE
PH: 6 (ref 5.0–8.0)
PROTEIN: 30 mg/dL — AB
Specific Gravity, Urine: 1.02 (ref 1.005–1.030)

## 2017-10-16 LAB — BLOOD GAS, VENOUS
Acid-base deficit: 11.1 mmol/L — ABNORMAL HIGH (ref 0.0–2.0)
Bicarbonate: 13.3 mmol/L — ABNORMAL LOW (ref 20.0–28.0)
O2 SAT: 93.9 %
PCO2 VEN: 27 mmHg — AB (ref 44.0–60.0)
PH VEN: 7.3 (ref 7.250–7.430)
Patient temperature: 37
pO2, Ven: 78 mmHg — ABNORMAL HIGH (ref 32.0–45.0)

## 2017-10-16 LAB — GLUCOSE, CAPILLARY
GLUCOSE-CAPILLARY: 231 mg/dL — AB (ref 65–99)
GLUCOSE-CAPILLARY: 242 mg/dL — AB (ref 65–99)
GLUCOSE-CAPILLARY: 242 mg/dL — AB (ref 65–99)
GLUCOSE-CAPILLARY: 244 mg/dL — AB (ref 65–99)
Glucose-Capillary: 351 mg/dL — ABNORMAL HIGH (ref 65–99)
Glucose-Capillary: 409 mg/dL — ABNORMAL HIGH (ref 65–99)
Glucose-Capillary: 306 mg/dL — ABNORMAL HIGH (ref 65–99)

## 2017-10-16 MED ORDER — INSULIN ASPART 100 UNIT/ML ~~LOC~~ SOLN
10.0000 [IU] | Freq: Three times a day (TID) | SUBCUTANEOUS | Status: DC
Start: 2017-10-16 — End: 2017-10-16

## 2017-10-16 MED ORDER — SODIUM CHLORIDE 0.9 % IV SOLN
INTRAVENOUS | Status: DC
  Administered 2017-10-16 – 2017-10-17 (×3): via INTRAVENOUS

## 2017-10-16 MED ORDER — LISINOPRIL 10 MG PO TABS
10.0000 mg | ORAL_TABLET | Freq: Every day | ORAL | Status: DC
  Administered 2017-10-17: 10 mg via ORAL
  Filled 2017-10-16: qty 1

## 2017-10-16 MED ORDER — INSULIN ASPART PROT & ASPART (70-30 MIX) 100 UNIT/ML ~~LOC~~ SUSP
20.0000 [IU] | Freq: Two times a day (BID) | SUBCUTANEOUS | Status: DC
  Administered 2017-10-16 – 2017-10-17 (×2): 20 [IU] via SUBCUTANEOUS
  Filled 2017-10-16 (×2): qty 1

## 2017-10-16 MED ORDER — ALBUTEROL SULFATE (2.5 MG/3ML) 0.083% IN NEBU: 3.0000 mL | INHALATION_SOLUTION | Freq: Four times a day (QID) | RESPIRATORY_TRACT | Status: DC | PRN

## 2017-10-16 MED ORDER — INSULIN GLARGINE 100 UNIT/ML ~~LOC~~ SOLN
14.0000 [IU] | SUBCUTANEOUS | Status: DC
Start: 2017-10-16 — End: 2017-10-16
  Filled 2017-10-16: qty 0.14

## 2017-10-16 MED ORDER — ACETAMINOPHEN 325 MG PO TABS: 650.0000 mg | ORAL_TABLET | Freq: Four times a day (QID) | ORAL | Status: DC | PRN

## 2017-10-16 MED ORDER — ONDANSETRON HCL 4 MG/2ML IJ SOLN: 4.0000 mg | Freq: Four times a day (QID) | INTRAMUSCULAR | Status: DC | PRN

## 2017-10-16 MED ORDER — NYSTATIN 100000 UNIT/GM EX POWD
Freq: Two times a day (BID) | CUTANEOUS | Status: DC
  Administered 2017-10-16 – 2017-10-17 (×3): via TOPICAL
  Filled 2017-10-16: qty 15

## 2017-10-16 MED ORDER — POLYETHYLENE GLYCOL 3350 17 G PO PACK: 17.0000 g | PACK | Freq: Every day | ORAL | Status: DC | PRN

## 2017-10-16 MED ORDER — INSULIN ASPART 100 UNIT/ML ~~LOC~~ SOLN
18.0000 [IU] | Freq: Once | SUBCUTANEOUS | Status: AC
  Administered 2017-10-16: 18 [IU] via SUBCUTANEOUS
  Filled 2017-10-16: qty 1

## 2017-10-16 MED ORDER — ASPIRIN EC 81 MG PO TBEC
81.0000 mg | DELAYED_RELEASE_TABLET | Freq: Every day | ORAL | Status: DC
  Administered 2017-10-16 – 2017-10-17 (×2): 81 mg via ORAL
  Filled 2017-10-16 (×2): qty 1

## 2017-10-16 MED ORDER — PANTOPRAZOLE SODIUM 40 MG PO TBEC
40.0000 mg | DELAYED_RELEASE_TABLET | Freq: Every day | ORAL | Status: DC
  Administered 2017-10-16 – 2017-10-17 (×2): 40 mg via ORAL
  Filled 2017-10-16 (×2): qty 1

## 2017-10-16 MED ORDER — BUPROPION HCL ER (SR) 150 MG PO TB12
150.0000 mg | ORAL_TABLET | Freq: Two times a day (BID) | ORAL | Status: DC
Start: 2017-10-16 — End: 2017-10-17
  Administered 2017-10-16 – 2017-10-17 (×3): 150 mg via ORAL
  Filled 2017-10-16 (×5): qty 1

## 2017-10-16 MED ORDER — TRAMADOL HCL 50 MG PO TABS
50.0000 mg | ORAL_TABLET | Freq: Four times a day (QID) | ORAL | Status: DC | PRN
  Administered 2017-10-16: 50 mg via ORAL
  Filled 2017-10-16: qty 1

## 2017-10-16 MED ORDER — ENOXAPARIN SODIUM 40 MG/0.4ML ~~LOC~~ SOLN
40.0000 mg | SUBCUTANEOUS | Status: DC
  Administered 2017-10-16: 40 mg via SUBCUTANEOUS
  Filled 2017-10-16: qty 0.4

## 2017-10-16 MED ORDER — ACETAMINOPHEN 650 MG RE SUPP: 650.0000 mg | Freq: Four times a day (QID) | RECTAL | Status: DC | PRN

## 2017-10-16 MED ORDER — ONDANSETRON HCL 4 MG PO TABS: 4.0000 mg | ORAL_TABLET | Freq: Four times a day (QID) | ORAL | Status: DC | PRN

## 2017-10-16 MED FILL — Dextrose Inj 5%: INTRAVENOUS | Qty: 1000 | Status: AC

## 2017-10-16 NOTE — Progress Notes (Signed)
[employer] [facility] - [place] at [facility]                                                                                                                                                                                  Patient Demographics   Pamela Lane, is a 57 y.o. female, DOB - 10-09-1960, ZOX:[id number]  Admit date - 10/15/2017   Admitting Physician Enedina Finner, MD  Outpatient Primary MD for the patient is Center, [facility]   LOS - 1  Subjective: Patient seen and evaluated today Still has nausea Has decreased oral intake Has a lot of dry heaves and burps No complaints of any chest pain Has itching in the groin area  Review of Systems:   CONSTITUTIONAL: No documented fever. No fatigue, weakness. No weight gain, no weight loss.  EYES: No blurry or double vision.  ENT: No tinnitus. No postnasal drip. No redness of the oropharynx.  RESPIRATORY: No cough, no wheeze, no hemoptysis. No dyspnea.  CARDIOVASCULAR: No chest pain. No orthopnea. No palpitations. No syncope.  GASTROINTESTINAL: Has nausea, no vomiting or diarrhea. No abdominal pain. No melena or hematochezia.  Dry heaves GENITOURINARY: No dysuria or hematuria.  ENDOCRINE: No polyuria or nocturia. No heat or cold intolerance.  HEMATOLOGY: No anemia. No bruising. No bleeding.  INTEGUMENTARY: No rashes. No lesions.  Itching in groin area MUSCULOSKELETAL: No arthritis. No swelling. No gout.  NEUROLOGIC: No numbness, tingling, or ataxia. No seizure-type activity.  PSYCHIATRIC: No anxiety. No insomnia. No ADD.    Vitals:   Vitals:   10/16/17 0100 10/16/17 0130 10/16/17 0215 10/16/17 0614  BP: 137/77 (!) 145/72 (!) 135/91 (!) 146/67  Pulse: (!) 102 (!) 101 (!) 102 (!) 101  Resp: 17 14 20 18   Temp:   98 F (36.7 C) 98.3 F (36.8 C)  TempSrc:   Oral Oral  SpO2: 96% 98% 100% 100%  Weight:   95.8 kg (211 lb 3.2 oz)   Height:   5\' 3"  (1.6 m)     Wt Readings from Last 3 Encounters:   10/16/17 95.8 kg (211 lb 3.2 oz)  09/23/17 102.5 kg (225 lb 15.5 oz)  04/24/17 100.7 kg (222 lb)     Intake/Output Summary (Last 24 hours) at 10/16/2017 1227 Last data filed at 10/16/2017 1001 Gross per 24 hour  Intake 435 ml  Output 500 ml  Net -65 ml    Physical Exam:   GENERAL: Pleasant-appearing female patient in no apparent distress.  HEAD, EYES, EARS, NOSE AND THROAT: Atraumatic, normocephalic. Extraocular muscles are intact. Pupils equal and reactive to light. Sclerae anicteric. No conjunctival injection. No oro-pharyngeal erythema.  Oropharynx dry NECK: Supple.  There is no jugular venous distention. No bruits, no lymphadenopathy, no thyromegaly.  HEART: Regular rate and rhythm,. No murmurs, no rubs, no clicks.  LUNGS: Clear to auscultation bilaterally. No rales or rhonchi. No wheezes.  ABDOMEN: Soft, flat, nontender, nondistended. Has good bowel sounds. No hepatosplenomegaly appreciated.  EXTREMITIES: No evidence of any cyanosis, clubbing, or peripheral edema.  +2 pedal and radial pulses bilaterally.  NEUROLOGIC: The patient is alert, awake, and oriented x3 with no focal motor or sensory deficits appreciated bilaterally.  SKIN: Itching in groin area Intertrigo noted Psych: Not anxious, depressed LN: No inguinal LN enlargement    Antibiotics   Anti-infectives (From admission, onward)   None      Medications   Scheduled Meds: . aspirin EC  81 mg Oral Daily  . buPROPion  150 mg Oral BID  . enoxaparin (LOVENOX) injection  40 mg Subcutaneous Q24H  . insulin aspart  0-15 Units Subcutaneous TID AC & HS  . insulin aspart  10 Units Subcutaneous TID WC  . insulin glargine  14 Units Subcutaneous Q24H  . nystatin   Topical BID  . pantoprazole  40 mg Oral Daily   Continuous Infusions: . sodium chloride 125 mL/hr at 10/16/17 1026   PRN Meds:.acetaminophen **OR** acetaminophen, albuterol, ondansetron **OR** ondansetron (ZOFRAN) IV, polyethylene glycol, traMADol   Data  Review:   Micro Results No results found for this or any previous visit (from the past 240 hour(s)).  Radiology Reports Dg Chest 1 View  Result Date: 10/15/2017 CLINICAL DATA:  Cough and nausea/vomiting over the past several days. Current history of asthma, diabetes and hypertension. Current 3 pack per day smoker. EXAM: Portable CHEST 1 VIEW COMPARISON:  04/24/2017, 01/12/2016 and earlier. FINDINGS: Cardiomediastinal silhouette unremarkable, unchanged. Emphysematous changes are suspected in the upper lobes. Lungs clear. Bronchovascular markings normal. Pulmonary vascularity normal. No visible pleural effusions. No pneumothorax. 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  No acute cardiopulmonary disease. Electronically Signed   By: Hulan Saashomas  Lawrence M.D.   On: 10/15/2017 08:51     CBC Recent Labs  Lab 10/15/17 0900 10/16/17 0243  WBC 12.2* 10.5  HGB 17.1* 15.6  HCT 52.1* 45.4  PLT 344 253  MCV 92.6 90.5  MCH 30.3 31.0  MCHC 32.7 34.3  RDW 13.9 13.4  LYMPHSABS 1.1  --   MONOABS 0.7  --   EOSABS 0.0  --   BASOSABS 0.1  --     Chemistries  Recent Labs  Lab 10/15/17 0900 10/15/17 1434 10/15/17 1854 10/15/17 2140 10/16/17 0051 10/16/17 0243 10/16/17 1014  NA 130* 141 134* 131* 137 134* 132*  K 4.5 3.8 3.9 4.1 3.6 3.4* 3.8  CL 92* 107 101 99* 104 101 101  CO2 13* 18* 18* 18* 23 21* 20*  GLUCOSE 735* 171* 333* 400* 248* 234* 309*  BUN 19 18 20  21* 20 20 18   CREATININE 1.53* 1.16* 1.30* 1.46* 1.03* 1.07* 1.05*  CALCIUM 9.7 9.8 9.2 8.9 8.8* 8.8* 8.6*  AST 16 18  --   --   --   --   --   ALT 22 21  --   --   --   --   --   ALKPHOS 110 114  --   --   --   --   --   BILITOT 1.7* 1.1  --   --   --   --   --    ------------------------------------------------------------------------------------------------------------------ estimated creatinine clearance is 65.9 mL/min (A) (by C-G formula based on SCr of  1.05 mg/dL  (H)). ------------------------------------------------------------------------------------------------------------------ No results for input(s): HGBA1C in the last 72 hours. ------------------------------------------------------------------------------------------------------------------ No results for input(s): CHOL, HDL, LDLCALC, TRIG, CHOLHDL, LDLDIRECT in the last 72 hours. ------------------------------------------------------------------------------------------------------------------ No results for input(s): TSH, T4TOTAL, T3FREE, THYROIDAB in the last 72 hours.  Invalid input(s): FREET3 ------------------------------------------------------------------------------------------------------------------ No results for input(s): VITAMINB12, FOLATE, FERRITIN, TIBC, IRON, RETICCTPCT in the last 72 hours.  Coagulation profile No results for input(s): INR, PROTIME in the last 168 hours.  No results for input(s): DDIMER in the last 72 hours.  Cardiac Enzymes Recent Labs  Lab 10/15/17 0900  TROPONINI <0.03   ------------------------------------------------------------------------------------------------------------------ Invalid input(s): POCBNP    Assessment & Plan   57 year old female patient with history of diabetes mellitus, bronchial asthma, hypertension admitted under hospitalist service for diabetic ketoacidosis, renal failure secondary to dehydration and pseudohyponatremia.  1.  Diabetic ketoacidosis improved Off insulin drip  2.  Uncontrolled diabetes mellitus Controlled blood sugars with increased dose of Lantus insulin and mealtime insulin will be increased Diabetic coordinator consultation for patient education on diet and control of blood sugars  3.  Dehydration IV fluids  4.  Hyponatremia improving  5.  Renal insufficiency secondary to dehydration improving Continue IV fluids  6.  Intertrigo Nystatin powder  7.  Disposition Charge in a.m. once sugars are  controlled and diabetic teaching done , glucometer to be arranged     Code Status Orders  (From admission, onward)        Start     Ordered   10/16/17 0213  Full code  Continuous     10/16/17 0212    Code Status History    Date Active Date Inactive Code Status Order ID Comments User Context   09/23/2017 2252 09/24/2017 1505 Full Code [id number]  Cammy Copa, MD Inpatient   05/15/2016 1745 05/16/2016 1749 Full Code [id number]  Auburn Bilberry, MD Inpatient   01/13/2016 0051 01/14/2016 1314 Full Code [id number]  Clydie Braun, MD ED   01/08/2016 1916 01/09/2016 1704 Full Code [id number]  Katharina Caper, MD Inpatient      Time Spent in minutes   35  Greater than 50% of time spent in care coordination and counseling patient regarding the condition and plan of care.   Ihor Austin M.D on 10/16/2017 at 12:27 PM  Between 7am to 6pm - Pager - [phone]  After 6pm go to [email or web address] - Social research officer, government  [employer]   Office  [phone]

## 2017-10-16 NOTE — Clinical Social Work Note (Signed)
CSW received a consult for assistance with a glucose meter. The RNCM handles this role. Please consult care management. CSW is signing off. Please consult should needs arise.  Argentina PonderKaren Martha Chritopher Coster, MSW, Theresia MajorsLCSWA [phone]

## 2017-10-16 NOTE — Progress Notes (Signed)
Patient's anion gap closed, transitioned off insulin drip, changed admit order to floor bed.  [name], Pamela Lane [facility] 10/16/2017, 1:26 AM

## 2017-10-16 NOTE — Progress Notes (Signed)
Appreciate diabetes program coordinator input. Insulin 70 3020 units subcu twice daily sliding scale coverage with insulin to continue Diabetic teaching

## 2017-10-16 NOTE — Progress Notes (Signed)
Inpatient Diabetes Program Recommendations  AACE/ADA: New Consensus Statement on Inpatient Glycemic Control (2015)  Target Ranges:  Prepandial:   less than 140 mg/dL      Peak postprandial:   less than 180 mg/dL (1-2 hours)      Critically ill patients:  140 - 180 mg/dL   Lab Results  Component Value Date   GLUCAP 409 (H) 10/16/2017   HGBA1C 11.3 (H) 09/24/2017    Review of Glycemic ControlResults for Pamela MoraleSPRINGFIELD, Pamela Lane (MRN [id number]) as of 10/16/2017 12:30  Ref. Range 10/15/2017 12:06 10/15/2017 16:39 10/15/2017 17:39 10/15/2017 20:14 10/15/2017 21:30 10/15/2017 23:19 10/16/2017 00:54 10/16/2017 02:38 10/16/2017 07:45 10/16/2017 11:41  Glucose-Capillary Latest Ref Range: 65 - 99 mg/dL 409547 (HH) 58 (L) 811208 (H) 360 (H) 409 (H) 283 (H) 242 (H) 231 (H) 351 (H) 409 (H)    Diabetes history: DKA- type 2 DM per problem list Outpatient Diabetes medications: 70/30 22 units bid, Metformin 500 mg bid Current orders for Inpatient glycemic control:  Lantus 14 units q HS, Novolog moderate tid with meals, Novolog 10 units tid with meals  Inpatient Diabetes Program Recommendations:    Note that patient was just in the hospital on 09/23/17.  She was supposed to be taking 70/30 22 units bid.  It is not clear if she was taking this prior to readmit.   May consider transition to 70/30 regimen as she was supposed to be taking at home.  Consider 70/30 20 units bid starting at supper/dinner time 1700.  Will have on-site DM coordinator see patient in the AM of 10/17/17.  Text page sent to MD.  Thanks,   Beryl MeagerJenny Lidwina Kaner, RN, BC-ADM Inpatient Diabetes Coordinator Pager [phone] (8a-5p)

## 2017-10-17 LAB — BASIC METABOLIC PANEL
Anion gap: 6 (ref 5–15)
BUN: 10 mg/dL (ref 6–20)
CALCIUM: 7.8 mg/dL — AB (ref 8.9–10.3)
CO2: 23 mmol/L (ref 22–32)
Chloride: 104 mmol/L (ref 101–111)
Creatinine, Ser: 0.74 mg/dL (ref 0.44–1.00)
GFR calc Af Amer: >60 mL/min (ref >60)
GLUCOSE: 294 mg/dL — AB (ref 65–99)
POTASSIUM: 3 mmol/L — AB (ref 3.5–5.1)
Sodium: 133 mmol/L — ABNORMAL LOW (ref 135–145)

## 2017-10-17 LAB — GLUCOSE, CAPILLARY
Glucose-Capillary: 292 mg/dL — ABNORMAL HIGH (ref 65–99)
Glucose-Capillary: 326 mg/dL — ABNORMAL HIGH (ref 65–99)

## 2017-10-17 MED ORDER — POTASSIUM CHLORIDE CRYS ER 20 MEQ PO TBCR
40.0000 meq | EXTENDED_RELEASE_TABLET | Freq: Once | ORAL | Status: AC
  Administered 2017-10-17: 40 meq via ORAL
  Filled 2017-10-17: qty 2

## 2017-10-17 MED ORDER — BLOOD GLUCOSE MONITOR KIT: PACK | 0 refills | Status: DC

## 2017-10-17 MED ORDER — LIVING WELL WITH DIABETES BOOK
Freq: Once | Status: AC
  Administered 2017-10-17: 10:00:00
  Filled 2017-10-17: qty 1

## 2017-10-17 NOTE — Progress Notes (Signed)
IV's  removed. Discharge instructions, follow-up appointments, and prescriptions were provided to the pt. All questions answered. The pt refused a wheelchair and walked downstairs.

## 2017-10-17 NOTE — Discharge Summary (Signed)
[employer] [facility] - [place] at [facility], [place] y.o., DOB 03/22/1961, MRN [id number]. Admission date: 10/15/2017 Discharge Date 10/17/2017 Primary MD [facility] Admitting Physician Fritzi Mandes, MD  Admission Diagnosis   1.  Diabetic ketoacidosis 2.  Acute renal failure 3 pseudohyponatremia. 4.Leukocytosis secondary to dehydration  Discharge Diagnosis       1.  Diabetic ketoacidosis resolved 2.  Acute renal failure resolved 3.Hyponatremia improved  Hospital Course   57 year old female patient with history of insulin-dependent diabetes mellitus was admitted on 10/15/2017 for elevated blood sugar and diabetic ketoacidosis.  Patient was also in renal failure at the time of admission she was started on IV insulin drip to control the blood sugars.  She was treated with IV fluids Dehydration improved.  Her renal failure improved.  Patient was switched to subcu insulin once blood sugars were controlled.  Diabetic teaching and education was done regarding diet and insulin medication patient will be discharged home follow-up with primary care physician in the clinic.  Patient was also treated with nystatin powder for intertrigo during her hospitalization DVT prophylaxis was given with Lovenox subcutaneously.  Consults  None  Significant Tests:  See full reports for all details    Dg Chest 1 View  Result Date: 10/15/2017 CLINICAL DATA:  Cough and nausea/vomiting over the past several days. Current history of asthma, diabetes and hypertension. Current 3 pack per day smoker. EXAM: Portable CHEST 1 VIEW COMPARISON:  04/24/2017, 01/12/2016 and earlier. FINDINGS: Cardiomediastinal silhouette unremarkable, unchanged. Emphysematous changes are suspected in the upper lobes. Lungs clear. Bronchovascular markings normal. Pulmonary vascularity normal. No visible pleural effusions. No pneumothorax. 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  No acute cardiopulmonary disease.  Electronically Signed   By: Evangeline Dakin M.D.   On: 10/15/2017 08:51       Today   Subjective:   Pamela Lane is a middle-aged female patient lying on the bed in no acute distress No chest pain, shortness of breath No fever and chills Has good appetite  Objective:   Blood pressure (!) 128/91, pulse 89, temperature 98.1 F (36.7 C), temperature source Oral, resp. rate 19, height '5\' 3"'  (1.6 m), weight 95.8 kg (211 lb 3.2 oz), SpO2 100 %.  .  Intake/Output Summary (Last 24 hours) at 10/17/2017 1518 Last data filed at 10/17/2017 1028 Gross per 24 hour  Intake 4871 ml  Output 1150 ml  Net 3721 ml    Exam VITAL SIGNS: Blood pressure (!) 128/91, pulse 89, temperature 98.1 F (36.7 C), temperature source Oral, resp. rate 19, height '5\' 3"'  (1.6 m), weight 95.8 kg (211 lb 3.2 oz), SpO2 100 %.  GENERAL:  57 y.o.-year-old patient lying in the bed with no acute distress.  EYES: Pupils equal, round, reactive to light and accommodation. No scleral icterus. Extraocular muscles intact.  HEENT: Head atraumatic, normocephalic. Oropharynx and nasopharynx clear.  NECK:  Supple, no jugular venous distention. No thyroid enlargement, no tenderness.  LUNGS: Normal breath sounds bilaterally, no wheezing, rales,rhonchi or crepitation. No use of accessory muscles of respiration.  CARDIOVASCULAR: S1, S2 normal. No murmurs, rubs, or gallops.  ABDOMEN: Soft, nontender, nondistended. Bowel sounds present. No organomegaly or mass.  EXTREMITIES: No pedal edema, cyanosis, or clubbing.  NEUROLOGIC: Cranial nerves II through XII are intact. Muscle strength 5/5 in all extremities. Sensation intact. Gait not checked.  PSYCHIATRIC: The patient is alert and oriented x 3.  SKIN: No obvious rash, lesion, or ulcer.   Data Review  CBC w Diff:  Lab Results  Component Value Date   WBC 10.5 10/16/2017   HGB 15.6 10/16/2017   HCT 45.4 10/16/2017   PLT 253 10/16/2017   LYMPHOPCT 9 10/15/2017   MONOPCT 6  10/15/2017   EOSPCT 0 10/15/2017   BASOPCT 0 10/15/2017   CMP:  Lab Results  Component Value Date   NA 133 (L) 10/17/2017   K 3.0 (L) 10/17/2017   CL 104 10/17/2017   CO2 23 10/17/2017   BUN 10 10/17/2017   CREATININE 0.74 10/17/2017   PROT 9.0 (H) 10/15/2017   ALBUMIN 4.9 10/15/2017   BILITOT 1.1 10/15/2017   ALKPHOS 114 10/15/2017   AST 18 10/15/2017   ALT 21 10/15/2017  .  Micro Results No results found for this or any previous visit (from the past 240 hour(s)).   Code Status History    Date Active Date Inactive Code Status Order ID Comments User Context   10/16/2017 0212 10/17/2017 1427 Full Code [id number]  Fritzi Mandes, MD Inpatient   09/23/2017 2252 09/24/2017 1505 Full Code [id number]  Amelia Jo, MD Inpatient   05/15/2016 1745 05/16/2016 1749 Full Code [id number]  Dustin Flock, MD Inpatient   01/13/2016 0051 01/14/2016 1314 Full Code [id number]  Norval Morton, MD ED   01/08/2016 1916 01/09/2016 1704 Full Code [id number]  Theodoro Grist, MD Inpatient          Follow-up [place], [facility]. Go on 10/20/2017.   Specialty:  General Practice Why:  Thursday at 1:20pm for hospital follow-up Contact information: [address] [place] [address] [phone]           Discharge Medications   Allergies as of 10/17/2017   No Known Allergies     Medication List    TAKE these medications   albuterol 108 (90 Base) MCG/ACT inhaler Commonly known as:  PROVENTIL HFA;VENTOLIN HFA Inhale 2 puffs into the lungs every 6 (six) hours as needed for wheezing or shortness of breath.   aspirin EC 81 MG tablet Take 81 mg by mouth daily.   blood glucose meter kit and supplies Kit Dispense based on patient and insurance preference. Use up to four times daily as directed. (FOR ICD-9 250.00, 250.01).   buPROPion 150 MG 12 hr tablet Commonly known as:  WELLBUTRIN SR Take 150 mg by mouth 2 (two) times daily.   insulin NPH-regular  Human (70-30) 100 UNIT/ML injection Commonly known as:  NOVOLIN 70/30 Inject 22 Units into the skin 2 (two) times daily with a meal.   lisinopril 10 MG tablet Commonly known as:  PRINIVIL Take 1 tablet (10 mg total) by mouth daily.   metFORMIN 500 MG tablet Commonly known as:  GLUCOPHAGE Take 1 tablet (500 mg total) by mouth 2 (two) times daily with a meal.   naproxen 500 MG tablet Commonly known as:  NAPROSYN Take 500 mg by mouth 2 (two) times daily with a meal.          Total Time in preparing paper work, data evaluation and todays exam - 35 minutes  Saundra Shelling M.D on 10/17/2017 at 3:18 PM [place]  [phone]

## 2017-10-17 NOTE — Progress Notes (Addendum)
Inpatient Diabetes Program Recommendations  AACE/ADA: New Consensus Statement on Inpatient Glycemic Control (2015)  Target Ranges:  Prepandial:   less than 140 mg/dL      Peak postprandial:   less than 180 mg/dL (1-2 hours)      Critically ill patients:  140 - 180 mg/dL   Results for AYSEL, GILCHREST (MRN [id number]) as of 10/17/2017 07:45  Ref. Range 10/16/2017 00:54 10/16/2017 02:38 10/16/2017 07:45 10/16/2017 11:41 10/16/2017 13:08 10/16/2017 16:50 10/16/2017 21:19 10/17/2017 03:24  Glucose-Capillary Latest Ref Range: 65 - 99 mg/dL 409 (H) 811 (H) 914 (H) 409 (H) 306 (H) 242 (H) 244 (H) 292 (H)  Results for LATAUNYA, RUUD (MRN [id number]) as of 10/17/2017 07:45  Ref. Range 09/24/2017 04:20  Hemoglobin A1C Latest Ref Range: 4.8 - 5.6 % 11.3 (H)   Review of Glycemic Control  Outpatient Diabetes medications: 70/30 22 units BID, Metformin 500 mg BID Current orders for Inpatient glycemic control: 70/30 20 units BID, Novolog 0-15 units ACHS  Inpatient Diabetes Program Recommendations: Insulin - Basal: Please consider increasing70/30 to 24 units BID. HgbA1C: A1C 11.3% on 09/24/2017 indicating an average glucose of 278 mg/dl over the past 2-3 months.   NOTE: In reviewing chart, noted J. Hanks, RN, Inpatient Diabetes Coordinator spoke with patient over phone on 09/24/2017 during previous hospitalization. Patient was admitted with hyperglycemia on 09/23/17 to 09/24/17 and with DKA this admission. Will plan to follow back up with patient today regarding readmission with DKA and A1C.  Addendum 10/17/17@12 :59-Went by to see patient at 11:15 and patient had already been discharged home. Called patient over the phone to discuss DM control. Patient reports that she is followed by [facility] for diabetes management and she takes 70/30 20 units BID and Metformin 500 mg BID as an outpatient for diabetes control. Patient reports that she is taking 70/30 and Metformin as prescribed. Patient reported that she had not  been checking her glucose at home because she did not have a glucometer. Patient states that she was provided with a prescription for a glucometer at discharge today and when she got it filled today if cost her $87 dollars. Patient inquired about Medicaid reimbursing her glucometer and testing supplies. Encouraged patient to call Medicaid office to inquire about what is covered and/or reimbursed. Informed patient that she could also purchase over the counter glucometer and testing supplies at [employer] (Reli-On Prime Glucometer $9 and box of 50 strips $9). Patient stated that if the glucometer she already bought is not reimbursed under Medicaid she may return it and go purchase a more affordable one at [employer].  Inquired about prior A1C and patient reports that she does not know what an A1C is. Explained what an A1C is and discussed last A1C results (11.3% on 09/24/2017) and explained that A1C indicates an average glucose of 278 mg/dl over the prior 2-3 months. Discussed glucose and A1C goals. Discussed importance of checking CBGs and maintaining good CBG control to prevent long-term and short-term complications.  Stressed to the patient the importance of improving glycemic control to prevent further complications from uncontrolled diabetes. Discussed impact of nutrition, exercise, stress, sickness, and medications on diabetes control. Encouraged patient to check her glucose 4 times per day (before meals and at bedtime) and to keep a log book of glucose readings and insulin taken which she will need to take to doctor appointments. Explained how the doctor she follows up with can use the log book to continue to make insulin adjustments if needed.  Patient reports [facility]  Patient verbalized understanding of information discussed and she states that she has no further questions at this time related to diabetes.  Thanks, Orlando PennerMarie Reda Citron, RN, MSN,  CDE Diabetes Coordinator Inpatient Diabetes Program [phone] (Team Pager from 8am to 5pm)

## 2017-11-18 NOTE — Telephone Encounter (Signed)
Note to close encounter.  

## 2017-11-26 ENCOUNTER — Inpatient Hospital Stay
Admission: EM | Admit: 2017-11-26 | Discharge: 2017-11-27 | DRG: 638 | Disposition: A | Discharge status: Home / Self Care | Payer: Medicaid Other | Attending: Specialist | Admitting: Specialist

## 2017-11-26 ENCOUNTER — Other Ambulatory Visit: Payer: Self-pay

## 2017-11-26 ENCOUNTER — Encounter: Payer: Self-pay | Admitting: Oncology

## 2017-11-26 DIAGNOSIS — F17210 Nicotine dependence, cigarettes, uncomplicated: Secondary | ICD-10-CM | POA: Diagnosis present

## 2017-11-26 DIAGNOSIS — Z794 Long term (current) use of insulin: Secondary | ICD-10-CM | POA: Diagnosis not present

## 2017-11-26 DIAGNOSIS — Z9119 Patient's noncompliance with other medical treatment and regimen: Secondary | ICD-10-CM | POA: Diagnosis not present

## 2017-11-26 DIAGNOSIS — J069 Acute upper respiratory infection, unspecified: Secondary | ICD-10-CM | POA: Diagnosis present

## 2017-11-26 DIAGNOSIS — Z8659 Personal history of other mental and behavioral disorders: Secondary | ICD-10-CM

## 2017-11-26 DIAGNOSIS — E860 Dehydration: Secondary | ICD-10-CM | POA: Diagnosis present

## 2017-11-26 DIAGNOSIS — D72829 Elevated white blood cell count, unspecified: Secondary | ICD-10-CM | POA: Diagnosis present

## 2017-11-26 DIAGNOSIS — E871 Hypo-osmolality and hyponatremia: Secondary | ICD-10-CM | POA: Diagnosis present

## 2017-11-26 DIAGNOSIS — K922 Gastrointestinal hemorrhage, unspecified: Secondary | ICD-10-CM

## 2017-11-26 DIAGNOSIS — T383X6A Underdosing of insulin and oral hypoglycemic [antidiabetic] drugs, initial encounter: Secondary | ICD-10-CM | POA: Diagnosis present

## 2017-11-26 DIAGNOSIS — Z79899 Other long term (current) drug therapy: Secondary | ICD-10-CM

## 2017-11-26 DIAGNOSIS — J45909 Unspecified asthma, uncomplicated: Secondary | ICD-10-CM | POA: Diagnosis present

## 2017-11-26 DIAGNOSIS — I10 Essential (primary) hypertension: Secondary | ICD-10-CM | POA: Diagnosis present

## 2017-11-26 DIAGNOSIS — Z9111 Patient's noncompliance with dietary regimen: Secondary | ICD-10-CM | POA: Diagnosis not present

## 2017-11-26 DIAGNOSIS — E1110 Type 2 diabetes mellitus with ketoacidosis without coma: Principal | ICD-10-CM

## 2017-11-26 DIAGNOSIS — E1310 Other specified diabetes mellitus with ketoacidosis without coma: Secondary | ICD-10-CM

## 2017-11-26 DIAGNOSIS — N179 Acute kidney failure, unspecified: Secondary | ICD-10-CM | POA: Diagnosis present

## 2017-11-26 DIAGNOSIS — Z791 Long term (current) use of non-steroidal anti-inflammatories (NSAID): Secondary | ICD-10-CM

## 2017-11-26 DIAGNOSIS — Z7982 Long term (current) use of aspirin: Secondary | ICD-10-CM | POA: Diagnosis not present

## 2017-11-26 DIAGNOSIS — E875 Hyperkalemia: Secondary | ICD-10-CM | POA: Diagnosis not present

## 2017-11-26 DIAGNOSIS — R739 Hyperglycemia, unspecified: Secondary | ICD-10-CM

## 2017-11-26 LAB — TYPE AND SCREEN
ABO/RH(D): A POS
ANTIBODY SCREEN: NEGATIVE

## 2017-11-26 LAB — BASIC METABOLIC PANEL
ANION GAP: 15 (ref 5–15)
Anion gap: 12 (ref 5–15)
BUN: 33 mg/dL — ABNORMAL HIGH (ref 6–20)
BUN: 33 mg/dL — AB (ref 6–20)
CALCIUM: 8.5 mg/dL — AB (ref 8.9–10.3)
CHLORIDE: 96 mmol/L — AB (ref 101–111)
CO2: 18 mmol/L — AB (ref 22–32)
CO2: 21 mmol/L — AB (ref 22–32)
CREATININE: 1.28 mg/dL — AB (ref 0.44–1.00)
Calcium: 8 mg/dL — ABNORMAL LOW (ref 8.9–10.3)
Chloride: 97 mmol/L — ABNORMAL LOW (ref 101–111)
Creatinine, Ser: 1.68 mg/dL — ABNORMAL HIGH (ref 0.44–1.00)
GFR calc non Af Amer: 33 mL/min — ABNORMAL LOW (ref >60)
GFR calc non Af Amer: 46 mL/min — ABNORMAL LOW (ref >60)
GFR, EST AFRICAN AMERICAN: 38 mL/min — AB (ref >60)
GFR, EST AFRICAN AMERICAN: 53 mL/min — AB (ref >60)
GLUCOSE: 239 mg/dL — AB (ref 65–99)
Glucose, Bld: 258 mg/dL — ABNORMAL HIGH (ref 65–99)
POTASSIUM: 3.8 mmol/L (ref 3.5–5.1)
Potassium: 3.2 mmol/L — ABNORMAL LOW (ref 3.5–5.1)
Sodium: 130 mmol/L — ABNORMAL LOW (ref 135–145)
Sodium: 129 mmol/L — ABNORMAL LOW (ref 135–145)

## 2017-11-26 LAB — COMPREHENSIVE METABOLIC PANEL
ALT: 23 U/L (ref 14–54)
AST: 19 U/L (ref 15–41)
Albumin: 4.7 g/dL (ref 3.5–5.0)
Alkaline Phosphatase: 95 U/L (ref 38–126)
Anion gap: 25 — ABNORMAL HIGH (ref 5–15)
BUN: 31 mg/dL — ABNORMAL HIGH (ref 6–20)
CHLORIDE: 81 mmol/L — AB (ref 101–111)
CO2: 13 mmol/L — ABNORMAL LOW (ref 22–32)
CREATININE: 1.88 mg/dL — AB (ref 0.44–1.00)
Calcium: 9.3 mg/dL (ref 8.9–10.3)
GFR, EST AFRICAN AMERICAN: 33 mL/min — AB (ref >60)
GFR, EST NON AFRICAN AMERICAN: 29 mL/min — AB (ref >60)
Glucose, Bld: 893 mg/dL (ref 65–99)
Potassium: 6.1 mmol/L — ABNORMAL HIGH (ref 3.5–5.1)
Sodium: 119 mmol/L — CL (ref 135–145)
Total Bilirubin: 1.5 mg/dL — ABNORMAL HIGH (ref 0.3–1.2)
Total Protein: 8.5 g/dL — ABNORMAL HIGH (ref 6.5–8.1)

## 2017-11-26 LAB — GLUCOSE, CAPILLARY
GLUCOSE-CAPILLARY: 559 mg/dL — AB (ref 65–99)
GLUCOSE-CAPILLARY: 202 mg/dL — AB (ref 65–99)
GLUCOSE-CAPILLARY: 271 mg/dL — AB (ref 65–99)
Glucose-Capillary: >600 mg/dL (ref 65–99)
Glucose-Capillary: 326 mg/dL — ABNORMAL HIGH (ref 65–99)
Glucose-Capillary: 204 mg/dL — ABNORMAL HIGH (ref 65–99)
Glucose-Capillary: 221 mg/dL — ABNORMAL HIGH (ref 65–99)

## 2017-11-26 LAB — CBC
HCT: 49.5 % — ABNORMAL HIGH (ref 35.0–47.0)
Hemoglobin: 16.6 g/dL — ABNORMAL HIGH (ref 12.0–16.0)
MCH: 31.8 pg (ref 26.0–34.0)
MCHC: 33.5 g/dL (ref 32.0–36.0)
MCV: 95.1 fL (ref 80.0–100.0)
PLATELETS: 375 10*3/uL (ref 150–440)
RBC: 5.2 MIL/uL (ref 3.80–5.20)
RDW: 13.4 % (ref 11.5–14.5)
WBC: 15.1 10*3/uL — AB (ref 3.6–11.0)

## 2017-11-26 LAB — URINALYSIS, COMPLETE (UACMP) WITH MICROSCOPIC
Bacteria, UA: NONE SEEN
Bilirubin Urine: NEGATIVE
Glucose, UA: >=500 mg/dL — AB
HGB URINE DIPSTICK: NEGATIVE
Ketones, ur: 20 mg/dL — AB
Nitrite: NEGATIVE
Protein, ur: NEGATIVE mg/dL
SPECIFIC GRAVITY, URINE: 1.021 (ref 1.005–1.030)
pH: 5 (ref 5.0–8.0)

## 2017-11-26 LAB — LIPASE, BLOOD: LIPASE: 44 U/L (ref 11–51)

## 2017-11-26 LAB — MRSA PCR SCREENING: MRSA by PCR: NEGATIVE

## 2017-11-26 LAB — BLOOD GAS, VENOUS
Acid-base deficit: 10.8 mmol/L — ABNORMAL HIGH (ref 0.0–2.0)
Bicarbonate: 13.8 mmol/L — ABNORMAL LOW (ref 20.0–28.0)
O2 Saturation: 89.9 %
PH VEN: 7.3 (ref 7.250–7.430)
Patient temperature: 37
pCO2, Ven: 28 mmHg — ABNORMAL LOW (ref 44.0–60.0)
pO2, Ven: 65 mmHg — ABNORMAL HIGH (ref 32.0–45.0)

## 2017-11-26 LAB — POCT PREGNANCY, URINE: PREG TEST UR: NEGATIVE

## 2017-11-26 LAB — BETA-HYDROXYBUTYRIC ACID: Beta-Hydroxybutyric Acid: 5.77 mmol/L — ABNORMAL HIGH (ref 0.05–0.27)

## 2017-11-26 MED ORDER — ONDANSETRON HCL 4 MG/2ML IJ SOLN
INTRAMUSCULAR | Status: AC
  Administered 2017-11-26: 4 mg via INTRAVENOUS
  Filled 2017-11-26: qty 2

## 2017-11-26 MED ORDER — SODIUM CHLORIDE 0.9 % IV SOLN
INTRAVENOUS | Status: AC
  Administered 2017-11-26: 15:00:00 via INTRAVENOUS

## 2017-11-26 MED ORDER — ONDANSETRON HCL 4 MG/2ML IJ SOLN
4.0000 mg | Freq: Once | INTRAMUSCULAR | Status: AC
  Administered 2017-11-26: 4 mg via INTRAVENOUS

## 2017-11-26 MED ORDER — INSULIN ASPART PROT & ASPART (70-30 MIX) 100 UNIT/ML ~~LOC~~ SUSP: 22.0000 [IU] | Freq: Two times a day (BID) | SUBCUTANEOUS | Status: DC

## 2017-11-26 MED ORDER — MORPHINE SULFATE (PF) 4 MG/ML IV SOLN
INTRAVENOUS | Status: AC
  Filled 2017-11-26: qty 2

## 2017-11-26 MED ORDER — FAMOTIDINE IN NACL 20-0.9 MG/50ML-% IV SOLN
20.0000 mg | Freq: Two times a day (BID) | INTRAVENOUS | Status: DC
  Administered 2017-11-26 – 2017-11-27 (×3): 20 mg via INTRAVENOUS
  Filled 2017-11-26 (×3): qty 50

## 2017-11-26 MED ORDER — SODIUM CHLORIDE 0.9 % IV BOLUS: 1000.0000 mL | Freq: Once | INTRAVENOUS | Status: AC

## 2017-11-26 MED ORDER — ORAL CARE MOUTH RINSE
15.0000 mL | Freq: Two times a day (BID) | OROMUCOSAL | Status: DC
  Administered 2017-11-26: 15 mL via OROMUCOSAL

## 2017-11-26 MED ORDER — ASPIRIN EC 81 MG PO TBEC
81.0000 mg | DELAYED_RELEASE_TABLET | Freq: Every day | ORAL | Status: DC
  Administered 2017-11-26 – 2017-11-27 (×2): 81 mg via ORAL
  Filled 2017-11-26 (×2): qty 1

## 2017-11-26 MED ORDER — ALBUTEROL SULFATE (2.5 MG/3ML) 0.083% IN NEBU
15.0000 mg | INHALATION_SOLUTION | Freq: Once | RESPIRATORY_TRACT | Status: AC
  Administered 2017-11-26: 15 mg via RESPIRATORY_TRACT
  Filled 2017-11-26 (×2): qty 18

## 2017-11-26 MED ORDER — SODIUM CHLORIDE 0.9 % IV SOLN
INTRAVENOUS | Status: DC
  Filled 2017-11-26: qty 1

## 2017-11-26 MED ORDER — INSULIN ASPART PROT & ASPART (70-30 MIX) 100 UNIT/ML ~~LOC~~ SUSP
22.0000 [IU] | Freq: Two times a day (BID) | SUBCUTANEOUS | Status: DC
  Administered 2017-11-26 – 2017-11-27 (×2): 22 [IU] via SUBCUTANEOUS
  Filled 2017-11-26 (×3): qty 1

## 2017-11-26 MED ORDER — PANTOPRAZOLE SODIUM 40 MG PO TBEC
80.0000 mg | DELAYED_RELEASE_TABLET | Freq: Once | ORAL | Status: AC
  Administered 2017-11-26: 80 mg via ORAL
  Filled 2017-11-26: qty 2

## 2017-11-26 MED ORDER — ENOXAPARIN SODIUM 40 MG/0.4ML ~~LOC~~ SOLN
40.0000 mg | SUBCUTANEOUS | Status: DC
  Administered 2017-11-26: 40 mg via SUBCUTANEOUS
  Filled 2017-11-26: qty 0.4

## 2017-11-26 MED ORDER — ALBUTEROL SULFATE (2.5 MG/3ML) 0.083% IN NEBU: 3.0000 mL | INHALATION_SOLUTION | Freq: Four times a day (QID) | RESPIRATORY_TRACT | Status: DC | PRN

## 2017-11-26 MED ORDER — SODIUM CHLORIDE 0.9 % IV SOLN
INTRAVENOUS | Status: DC
  Administered 2017-11-26: 16:00:00 via INTRAVENOUS

## 2017-11-26 MED ORDER — SODIUM CHLORIDE 0.9 % IV SOLN
INTRAVENOUS | Status: DC
  Administered 2017-11-26: 5.4 [IU]/h via INTRAVENOUS
  Filled 2017-11-26: qty 1

## 2017-11-26 MED ORDER — DEXTROSE-NACL 5-0.45 % IV SOLN
INTRAVENOUS | Status: DC
  Administered 2017-11-26: 18:00:00 via INTRAVENOUS

## 2017-11-26 MED ORDER — SODIUM CHLORIDE 0.9 % IV BOLUS
1000.0000 mL | Freq: Once | INTRAVENOUS | Status: AC
  Administered 2017-11-26: 1000 mL via INTRAVENOUS

## 2017-11-26 MED ORDER — BUPROPION HCL ER (SR) 150 MG PO TB12
150.0000 mg | ORAL_TABLET | Freq: Two times a day (BID) | ORAL | Status: DC
  Administered 2017-11-26 – 2017-11-27 (×3): 150 mg via ORAL
  Filled 2017-11-26 (×5): qty 1

## 2017-11-26 MED ORDER — POTASSIUM CHLORIDE CRYS ER 10 MEQ PO TBCR
30.0000 meq | EXTENDED_RELEASE_TABLET | ORAL | Status: AC
  Administered 2017-11-27 (×2): 30 meq via ORAL
  Filled 2017-11-26: qty 2
  Filled 2017-11-26: qty 1

## 2017-11-26 NOTE — Progress Notes (Signed)
Pt arrived to unit from ED. She is alert and oriented x 3 with boyfriend at bedside. She is being admitted for DKA, current CBG reading high on glucometer. Insulin drip infusing. Pt denies pain, nausea at this time, but does complain of dry mouth and thirst. VSS: BP (!) 132/59   Pulse (!) 118   Temp 98.3 F (36.8 C) (Oral)   Resp 20   Ht  (1.6 m)   Wt 94.5 kg (208 lb 5.4 oz)   SpO2 (!) 19%   BMI 36.90 kg/m . Dr. Lonn Georgia rounded on pt in ED. Pt resting in bed with family at bedside.

## 2017-11-26 NOTE — Consult Note (Signed)
Reason for Consult: To assist with critical care management for DKA Referring Physician: Dr. Karma Greaser is an 57 y.o. female.  HPI: Pamela Lane is a 57 year old Afro-American female with a past medical history remarkable for hypertension, diabetes, asthma, psychiatric disorder unspecified, has had multiple episodes of DKA, presents after having the flu with complaints of nausea, weakness presented to the emergency room, laboratory evaluation was consistent with DKA. Pertinent labs revealed a blood sugar greater than 600, beta hydroxybutyrate is 5.77, sodium of 110, potassium 6.1, initial glucose of 893, anion gap Of 25 with a serum bicarbonate 13. Presently she is awake, alert in no acute distress is receiving fluid rehydration and insulin infusion. Is being admitted to the intensive care unit for DKA   Past Medical History:  Diagnosis Date  . Asthma   . Diabetes mellitus without complication ([place])   . Hypertension   . Mental disorder    pt reports 'I have all of them mental disorders'    Past Surgical History:  Procedure Laterality Date  . ABDOMINAL HYSTERECTOMY      Family History  Problem Relation Age of Onset  . Hypertension Mother     Social History:  reports that she has been smoking cigarettes.  She has been smoking about 3.00 packs per day. She has never used smokeless tobacco. She reports that she drinks about 24.0 oz of alcohol per week. She reports that she has current or past drug history. Drugs: Marijuana and Cocaine.  Allergies: No Known Allergies  Medications: I have reviewed the patient's current medications.  Results for orders placed or performed during the hospital encounter of 11/26/17 (from the past 48 hour(s))  Glucose, capillary     Status: Abnormal   Collection Time: 11/26/17 10:53 AM  Result Value Ref Range   Glucose-Capillary >600 (HH) 65 - 99 mg/dL  Comprehensive metabolic panel     Status: Abnormal   Collection Time: 11/26/17  11:06 AM  Result Value Ref Range   Sodium 119 (LL) 135 - 145 mmol/L    Comment: CRITICAL RESULT CALLED TO, READ BACK BY AND VERIFIED WITH Pamela Lane_0  ON 11/26/17 BY HKP RESULTS VERIFIED BY REPEAT TESTING/HKP    Potassium 6.1 (H) 3.5 - 5.1 mmol/L    Comment: CRITICAL RESULT CALLED TO, READ BACK BY AND VERIFIED WITH Pamela Lane_1  ON 11/26/17 BY HKP    Chloride 81 (L) 101 - 111 mmol/L   CO2 13 (L) 22 - 32 mmol/L   Glucose, Bld 893 (HH) 65 - 99 mg/dL    Comment: CRITICAL RESULT CALLED TO, READ BACK BY AND VERIFIED WITH Pamela Lane_2  ON 11/26/17 BY HKP    BUN 31 (H) 6 - 20 mg/dL   Creatinine, Ser 1.88 (H) 0.44 - 1.00 mg/dL   Calcium 9.3 8.9 - 10.3 mg/dL   Total Protein 8.5 (H) 6.5 - 8.1 g/dL   Albumin 4.7 3.5 - 5.0 g/dL   AST 19 15 - 41 U/L   ALT 23 14 - 54 U/L   Alkaline Phosphatase 95 38 - 126 U/L   Total Bilirubin 1.5 (H) 0.3 - 1.2 mg/dL   GFR calc non Af Amer 29 (L) >60 mL/min   GFR calc Af Amer 33 (L) >60 mL/min    Comment: (NOTE) The eGFR has been calculated using the CKD EPI equation. This calculation has not been validated in all clinical situations. eGFR's persistently <60 mL/min signify possible Chronic Kidney Disease.    Anion gap 25 (H) 5 -  15    Comment: Performed at [facility], [address] [place], [place] [address]  CBC     Status: Abnormal   Collection Time: 11/26/17 11:06 AM  Result Value Ref Range   WBC 15.1 (H) 3.6 - 11.0 K/uL   RBC 5.20 3.80 - 5.20 MIL/uL   Hemoglobin 16.6 (H) 12.0 - 16.0 g/dL   HCT 49.5 (H) 35.0 - 47.0 %   MCV 95.1 80.0 - 100.0 fL   MCH 31.8 26.0 - 34.0 pg   MCHC 33.5 32.0 - 36.0 g/dL   RDW 13.4 11.5 - 14.5 %   Platelets 375 150 - 440 K/uL    Comment: Performed at [facility], [address] [place], [place] [address]  Type and screen [place]     Status: None   Collection Time: 11/26/17 11:06 AM  Result Value Ref Range   ABO/RH(D) A POS    Antibody Screen NEG     Sample Expiration      11/29/2017 Performed at [facility], [address] [place], [place] [address]   Lipase, blood     Status: None   Collection Time: 11/26/17 11:06 AM  Result Value Ref Range   Lipase 44 11 - 51 U/L    Comment: Performed at [facility], [address] [place], [place] [address]  Blood gas, venous     Status: Abnormal   Collection Time: 11/26/17 11:44 AM  Result Value Ref Range   pH, Ven 7.30 7.250 - 7.430   pCO2, Ven 28 (L) 44.0 - 60.0 mmHg   pO2, Ven 65.0 (H) 32.0 - 45.0 mmHg   Bicarbonate 13.8 (L) 20.0 - 28.0 mmol/L   Acid-base deficit 10.8 (H) 0.0 - 2.0 mmol/L   O2 Saturation 89.9 %   Patient temperature 37.0    Sample type VENOUS     Comment: Performed at [facility], [address] [place], [place] [address]  Beta-hydroxybutyric acid     Status: Abnormal   Collection Time: 11/26/17 11:44 AM  Result Value Ref Range   Beta-Hydroxybutyric Acid 5.77 (H) 0.05 - 0.27 mmol/L    Comment: RESULT CONFIRMED BY MANUAL DILUTION.MSS Performed at [facility], [address] [place], [place] [address]   Urinalysis, Complete w Microscopic     Status: Abnormal   Collection Time: 11/26/17 12:26 PM  Result Value Ref Range   Color, Urine YELLOW (A) YELLOW   APPearance CLOUDY (A) CLEAR   Specific Gravity, Urine 1.021 1.005 - 1.030   pH 5.0 5.0 - 8.0   Glucose, UA >=500 (A) NEGATIVE mg/dL   Hgb urine dipstick NEGATIVE NEGATIVE   Bilirubin Urine NEGATIVE NEGATIVE   Ketones, ur 20 (A) NEGATIVE mg/dL   Protein, ur NEGATIVE NEGATIVE mg/dL   Nitrite NEGATIVE NEGATIVE   Leukocytes, UA LARGE (A) NEGATIVE   RBC / HPF 11-20 0 - 5 RBC/hpf   WBC, UA 21-50 0 - 5 WBC/hpf   Bacteria, UA NONE SEEN NONE SEEN   Squamous Epithelial / LPF 6-10 0 - 5   Mucus PRESENT    Budding Yeast PRESENT     Comment: Performed at [facility], [address] [place], [place] [address]  Pregnancy, urine POC     Status: None   Collection  Time: 11/26/17 12:53 PM  Result Value Ref Range   Preg Test, Ur NEGATIVE NEGATIVE    Comment:        THE SENSITIVITY OF THIS  METHODOLOGY IS >24 mIU/mL   Glucose, capillary     Status: Abnormal   Collection Time: 11/26/17  1:14 PM  Result Value Ref Range   Glucose-Capillary >600 (HH) 65 - 99 mg/dL    No results found.  ROS Blood pressure 118/73, pulse (!) 118, temperature 98.5 F (36.9 C), temperature source Oral, resp. rate (!) 24, height _0  (1.6 m), weight 240 lb (108.9 kg), SpO2 99 %. Physical Exam Patient is awake, alert in no acute distress Vital signs: Please see the above listed vital signs HEENT: Trachea midline, no thyromegaly appreciated, no accessory muscle utilization, edentulous Cardiovascular: Tachycardia, sinus rhythm noted on monitor Pulmonary: Clear to auscultation Abdominal: Positive bowel sounds, soft exam Extremities: No clubbing, cyanosis or edema Neurologic: No focal deficits appreciated Cutaneous: No rashes or lesions noted  Assessment/Plan:  DKA most likely precipitated by URI, patient states has been taking insulin at home along with metformin. Her home regimen consists of 7030 22 units twice a day. Patient is being admitted, started on DKA protocol  Hyperkalemia. Should come down with insulin and transcellular shift along with hydration  Hyponatremia will improve as glucoses brought down  Acid-based derangement. Anion gap is 25, delta gap of 13, calculated starting bicarbonate 26 consistent with a simple anion gap metabolic acidosis.    Pamela Lane 11/26/2017, 1:55 PM

## 2017-11-26 NOTE — Progress Notes (Signed)
Pt's most recent BMP has resulted and results called to Dr. Lonn [place]. In addition, pt has not voided since admission. Bladder scan shows of urine in bladder. Dr. Lonn [place] notified of decreased urine output as well. No new orders received at this time.

## 2017-11-26 NOTE — ED Triage Notes (Signed)
Pt reports not feeling well starting yesterday, pt states that she is vomiting dark stuff, coffee grounds emesis noted in triage, pt fsbs at hi in triage also

## 2017-11-26 NOTE — ED Provider Notes (Addendum)
[facility] Emergency Department Provider Note  ____________________________________________   First MD Initiated Contact with Patient 11/26/17 1115     (approximate)  I have reviewed the triage vital signs and the nursing notes.   HISTORY  Chief Complaint Abdominal Pain; Emesis; and Hyperglycemia   HPI Pamela Lane is a 57 y.o. female who self presents to the emergency department with 3 or 4 days of diffuse cramping upper abdominal pain nausea and vomiting.  Her vomiting became worse this morning and she vomited up dark black material which concerned her so she came to the emergency department.  She checked her blood sugar at home and it was "high" which concerned her.  She has had diabetes for the past 2 years and is insulin-dependent.  She has a remote history of hysterectomy.  She is passing stool and flatus.  Pain is intermittent moderate to severe cramping upper abdominal nonradiating.  Nothing seems to make it better or worse.  Past Medical History:  Diagnosis Date  . Asthma   . Diabetes mellitus without complication ([place])   . Hypertension   . Mental disorder    pt reports 'I have all of them mental disorders'    Patient Active Problem List   Diagnosis Date Noted  . Acute pancreatitis 05/15/2016  . AKI (acute kidney injury) ([place])   . Diabetic ketoacidosis without coma associated with type 2 diabetes mellitus ([place])   . Hyperglycemia due to type 2 diabetes mellitus ([place]) 01/13/2016  . UTI (urinary tract infection) 01/13/2016  . DKA (diabetic ketoacidoses) ([place]) 01/08/2016  . Hyponatremia 01/08/2016  . Dehydration 01/08/2016  . Urinary tract infection 01/08/2016  . Upper abdominal pain 01/08/2016  . Nausea & vomiting 01/08/2016    Past Surgical History:  Procedure Laterality Date  . ABDOMINAL HYSTERECTOMY      Prior to Admission medications   Medication Sig Start Date End Date Taking? Authorizing Provider  albuterol (PROVENTIL  HFA;VENTOLIN HFA) 108 (90 Base) MCG/ACT inhaler Inhale 2 puffs into the lungs every 6 (six) hours as needed for wheezing or shortness of breath.   Yes [provider]  aspirin EC 81 MG tablet Take 81 mg by mouth daily.   Yes [provider]  buPROPion (WELLBUTRIN SR) 150 MG 12 hr tablet Take 150 mg by mouth 2 (two) times daily.   Yes [provider]  lisinopril (PRINIVIL) 10 MG tablet Take 1 tablet (10 mg total) by mouth daily. 04/24/17  Yes Delman Kitten, MD  metFORMIN (GLUCOPHAGE) 500 MG tablet Take 1 tablet (500 mg total) by mouth 2 (two) times daily with a meal. 09/24/17  Yes Mody, Sital, MD  naproxen (NAPROSYN) 500 MG tablet Take 500 mg by mouth 2 (two) times daily with a meal.   Yes [provider]  blood glucose meter kit and supplies KIT Dispense based on patient and insurance preference. Use up to four times daily as directed. (FOR ICD-9 250.00, 250.01). 10/17/17   Saundra Shelling, MD  insulin NPH-regular Human (NOVOLIN 70/30) (70-30) 100 UNIT/ML injection Inject 22 Units into the skin 2 (two) times daily with a meal. 09/24/17 11/23/17  Bettey Costa, MD    Allergies Patient has no known allergies.  Family History  Problem Relation Age of Onset  . Hypertension Mother     Social History Social History   Tobacco Use  . Smoking status: Current Every Day Smoker    Packs/day: 3.00    Types: Cigarettes  . Smokeless tobacco: Never Used  Substance Use Topics  . Alcohol use: Yes    Alcohol/week: 24.0 oz    Types: 40 Cans of beer per week    Comment: 3 big bottles of liquor  . Drug use: Yes    Types: Marijuana, Cocaine    Review of Systems Constitutional: No fever/chills Eyes: No visual changes. ENT: No sore throat. Cardiovascular: Denies chest pain. Respiratory: Denies shortness of breath. Gastrointestinal: Positive for abdominal pain.  Positive for nausea, positive for vomiting.  No diarrhea.  No constipation. Genitourinary: Negative for  dysuria. Musculoskeletal: Negative for back pain. Skin: Negative for rash. Neurological: Negative for headaches, focal weakness or numbness.   ____________________________________________   PHYSICAL EXAM:  VITAL SIGNS: ED Triage Vitals  Enc Vitals Group     BP 11/26/17 1056 118/73     Pulse Rate 11/26/17 1056 (!) 118     Resp 11/26/17 1056 (!) 24     Temp 11/26/17 1056 98.5 F (36.9 C)     Temp Source 11/26/17 1056 Oral     SpO2 11/26/17 1056 99 %     Weight 11/26/17 1058 240 lb (108.9 kg)     Height 11/26/17 1058 '5\' 3"'  (1.6 m)     Head Circumference --      Peak Flow --      Pain Score 11/26/17 1057 10     Pain Loc --      Pain Edu? --      Excl. in [place]? --     Constitutional: Alert and oriented x4 appears uncomfortable curled on her side tearful Eyes: PERRL EOMI. Head: Atraumatic. Nose: No congestion/rhinnorhea. Mouth/Throat: No trismus Neck: No stridor.   Cardiovascular: Tachycardic rate, regular rhythm. Grossly normal heart sounds.  Good peripheral circulation. Respiratory: Increased respiratory effort.  No retractions. Lungs CTAB and moving good air Gastrointestinal: Soft abdomen diffusely tender no focality no rebound or guarding no peritonitis Guaiac positive control positive brown stool Musculoskeletal: No lower extremity edema   Neurologic:  Normal speech and language. No gross focal neurologic deficits are appreciated. Skin:  Skin is warm, dry and intact. No rash noted. Psychiatric: Mood and affect are normal. Speech and behavior are normal.    ____________________________________________   DIFFERENTIAL includes but not limited to  DKA, HHS, upper GI bleed, lower GI bleed, pancreatitis ____________________________________________   LABS (all labs ordered are listed, but only abnormal results are displayed)  Labs Reviewed  GLUCOSE, CAPILLARY - Abnormal; Notable for the following components:      Result Value   Glucose-Capillary >600 (*)    All  other components within normal limits  COMPREHENSIVE METABOLIC PANEL - Abnormal; Notable for the following components:   Sodium 119 (*)    Potassium 6.1 (*)    Chloride 81 (*)    CO2 13 (*)    Glucose, Bld 893 (*)    BUN 31 (*)    Creatinine, Ser 1.88 (*)    Total Protein 8.5 (*)    Total Bilirubin 1.5 (*)    GFR calc non Af Amer 29 (*)    GFR calc Af Amer 33 (*)    Anion gap 25 (*)    All other components within normal limits  CBC - Abnormal; Notable for the following components:   WBC 15.1 (*)    Hemoglobin 16.6 (*)    HCT 49.5 (*)    All other components within normal limits  BLOOD GAS, VENOUS - Abnormal; Notable for the following components:   pCO2, Ven 28 (*)  pO2, Ven 65.0 (*)    Bicarbonate 13.8 (*)    Acid-base deficit 10.8 (*)    All other components within normal limits  BETA-HYDROXYBUTYRIC ACID  URINALYSIS, COMPLETE (UACMP) WITH MICROSCOPIC  LIPASE, BLOOD  POC OCCULT BLOOD, ED  TYPE AND SCREEN    Lab work reviewed by me with a number of abnormalities including acute kidney injury, extremely high sugar, high anion gap, low pH __________________________________________  EKG   ____________________________________________  RADIOLOGY   ____________________________________________   PROCEDURES  Procedure(s) performed: yes  Angiocath insertion Performed by: Darel Hong  Consent: Verbal consent obtained. Risks and benefits: risks, benefits and alternatives were discussed Time out: Immediately prior to procedure a "time out" was called to verify the correct patient, procedure, equipment, support staff and site/side marked as required.  Preparation: Patient was prepped and draped in the usual sterile fashion.  Vein Location: left upper arm  Ultrasound Guided  Gauge: 20  Normal blood return and flush without difficulty Patient tolerance: Patient tolerated the procedure well with no immediate complications.     .Critical Care Performed by:  Darel Hong, MD Authorized by: Darel Hong, MD   Critical care provider statement:    Critical care time (minutes):  30   Critical care time was exclusive of:  Separately billable procedures and treating other patients   Critical care was necessary to treat or prevent imminent or life-threatening deterioration of the following conditions:  Endocrine crisis   Critical care was time spent personally by me on the following activities:  Development of treatment plan with patient or surrogate, discussions with consultants, evaluation of patient's response to treatment, examination of patient, obtaining history from patient or surrogate, ordering and performing treatments and interventions, ordering and review of laboratory studies, ordering and review of radiographic studies, pulse oximetry, re-evaluation of patient's condition and review of old charts    Critical Care performed: Yes  Observation: no ____________________________________________   INITIAL IMPRESSION / ASSESSMENT AND PLAN / ED COURSE  Pertinent labs & imaging results that were available during my care of the patient were reviewed by me and considered in my medical decision making (see chart for details).  On arrival the patient is tachycardic and uncomfortable appearing.  Diffusely tender abdomen although not peritonitis.  She is guaiac positive although with brown stool.  I have a high clinical suspicion for DKA versus HHS along with upper GI bleed.  N.p.o. with normal saline and oral Protonix now given the critical national shortage of IV Protonix.     ----------------------------------------- 12:06 PM on 11/26/2017 ----------------------------------------- Notified by lab that the patient's blood sugars 800 and K is 6.1.  Albuterol and insulin drip now.  ____________________________________________ ----------------------------------------- 12:29 PM on 11/26/2017 -----------------------------------------  The  patient's chemistry is now back concerning for diabetic ketoacidosis.  Her anion gap is 25 with a CO2 of 13 and the pH is 7.3 along with a blood sugar of 893.  We will continue the insulin drip and bolus her another liter of fluid for her acute kidney injury.  Her sodium of 119 corrects to 138.  At this point she requires inpatient admission for continued IV insulin, fluid resuscitation, and treatment of her multiple metabolic abnormalities.  I discussed with the hospitalist who has graciously agreed to admit the patient to his service.  FINAL CLINICAL IMPRESSION(S) / ED DIAGNOSES  Final diagnoses:  Gastrointestinal hemorrhage, unspecified gastrointestinal hemorrhage type  Hyperglycemia  Hyperkalemia  Diabetic ketoacidosis without coma associated with other specified diabetes mellitus ([place])  NEW MEDICATIONS STARTED DURING THIS VISIT:  New Prescriptions   No medications on file     Note:  This document was prepared using Dragon voice recognition software and may include unintentional dictation errors.     Darel Hong, MD 11/26/17 1230    Darel Hong, MD 11/26/17 1300

## 2017-11-26 NOTE — H&P (Signed)
[name] NAME: [name]    MR#:  [id number]  DATE OF BIRTH:  1961-01-10  DATE OF ADMISSION:  11/26/2017  PRIMARY CARE PHYSICIAN: [facility]   REQUESTING/REFERRING PHYSICIAN: Darel Hong, MD  CHIEF COMPLAINT:   Chief Complaint  Patient presents with  . Abdominal Pain  . Emesis  . Hyperglycemia    HISTORY OF PRESENT ILLNESS: Pamela Lane  is a 57 y.o. female with a known history of asthma, diabetes type 2, essential hypertension, nicotine abuse and heavy alcohol use in the past presenting with abdominal pain ongoing for the past 2 weeks.  Patient states that the abdominal pain is all over in her abdomen.  Describes it as being constant.  She reports that over the past few days is progressively gotten worse she has been nauseous and throwing up.  She states that she has not been taking her insulin for the past few days as well.  She denies any fevers chills.  She did in the emergency room is noted to have DKA.  And hyperkalemia. PAST MEDICAL HISTORY:   Past Medical History:  Diagnosis Date  . Asthma   . Diabetes mellitus without complication ([place])   . Hypertension   . Mental disorder    pt reports 'I have all of them mental disorders'    PAST SURGICAL HISTORY:  Past Surgical History:  Procedure Laterality Date  . ABDOMINAL HYSTERECTOMY      SOCIAL HISTORY:  Social History   Tobacco Use  . Smoking status: Current Every Day Smoker    Packs/day: 3.00    Types: Cigarettes  . Smokeless tobacco: Never Used  Substance Use Topics  . Alcohol use: Yes    Alcohol/week: 24.0 oz    Types: 40 Cans of beer per week    Comment: 3 big bottles of liquor    FAMILY HISTORY:  Family History  Problem Relation Age of Onset  . Hypertension Mother     DRUG ALLERGIES: No Known Allergies  REVIEW OF SYSTEMS:   CONSTITUTIONAL: No fever, positive fatigue or positive weakness.  EYES: No blurred  or double vision.  EARS, NOSE, AND THROAT: No tinnitus or ear pain.  RESPIRATORY: No cough, shortness of breath, wheezing or hemoptysis.  CARDIOVASCULAR: No chest pain, orthopnea, edema.  GASTROINTESTINAL: Positive nausea, positive vomiting, no diarrhea or positive abdominal pain.  GENITOURINARY: No dysuria, hematuria.  ENDOCRINE: No polyuria, nocturia,  HEMATOLOGY: No anemia, easy bruising or bleeding SKIN: No rash or lesion. MUSCULOSKELETAL: No joint pain or arthritis.   NEUROLOGIC: No tingling, numbness, weakness.  PSYCHIATRY: No anxiety or depression.   MEDICATIONS AT HOME:  Prior to Admission medications   Medication Sig Start Date End Date Taking? Authorizing Provider  albuterol (PROVENTIL HFA;VENTOLIN HFA) 108 (90 Base) MCG/ACT inhaler Inhale 2 puffs into the lungs every 6 (six) hours as needed for wheezing or shortness of breath.   Yes [provider]  aspirin EC 81 MG tablet Take 81 mg by mouth daily.   Yes [provider]  buPROPion (WELLBUTRIN SR) 150 MG 12 hr tablet Take 150 mg by mouth 2 (two) times daily.   Yes [provider]  lisinopril (PRINIVIL) 10 MG tablet Take 1 tablet (10 mg total) by mouth daily. 04/24/17  Yes Delman Kitten, MD  metFORMIN (GLUCOPHAGE) 500 MG tablet Take 1 tablet (500 mg total) by mouth 2 (two) times daily with a meal. 09/24/17  Yes Bettey Costa, MD  naproxen (NAPROSYN) 500 MG tablet Take 500 mg by mouth 2 (two) times daily with a meal.   Yes [provider]  blood glucose meter kit and supplies KIT Dispense based on patient and insurance preference. Use up to four times daily as directed. (FOR ICD-9 250.00, 250.01). 10/17/17   Saundra Shelling, MD  insulin NPH-regular Human (NOVOLIN 70/30) (70-30) 100 UNIT/ML injection Inject 22 Units into the skin 2 (two) times daily with a meal. 09/24/17 11/23/17  Bettey Costa, MD      PHYSICAL EXAMINATION:   VITAL SIGNS: Blood pressure 118/73, pulse (!) 118, temperature 98.5 F (36.9  C), temperature source Oral, resp. rate (!) 24, height '5\' 3"'  (1.6 m), weight 108.9 kg (240 lb), SpO2 99 %.  GENERAL:  57 y.o.-year-old patient lying in the bed with no acute distress.  EYES: Pupils equal, round, reactive to light and accommodation. No scleral icterus. Extraocular muscles intact.  HEENT: Head atraumatic, normocephalic. Oropharynx and nasopharynx clear.  NECK:  Supple, no jugular venous distention. No thyroid enlargement, no tenderness.  LUNGS: Normal breath sounds bilaterally, no wheezing, rales,rhonchi or crepitation. No use of accessory muscles of respiration.  CARDIOVASCULAR: S1, S2 normal. No murmurs, rubs, or gallops.  ABDOMEN: Soft, nontender, nondistended. Bowel sounds present. No organomegaly or mass.  EXTREMITIES: No pedal edema, cyanosis, or clubbing.  NEUROLOGIC: Cranial nerves II through XII are intact. Muscle strength 5/5 in all extremities. Sensation intact. Gait not checked.  PSYCHIATRIC: The patient is alert and oriented x 3.  SKIN: No obvious rash, lesion, or ulcer.   LABORATORY PANEL:   CBC Recent Labs  Lab 11/26/17 1106  WBC 15.1*  HGB 16.6*  HCT 49.5*  PLT 375  MCV 95.1  MCH 31.8  MCHC 33.5  RDW 13.4   ------------------------------------------------------------------------------------------------------------------  Chemistries  Recent Labs  Lab 11/26/17 1106  NA 119*  K 6.1*  CL 81*  CO2 13*  GLUCOSE 893*  BUN 31*  CREATININE 1.88*  CALCIUM 9.3  AST 19  ALT 23  ALKPHOS 95  BILITOT 1.5*   ------------------------------------------------------------------------------------------------------------------ estimated creatinine clearance is 39.6 mL/min (A) (by C-G formula based on SCr of 1.88 mg/dL (H)). ------------------------------------------------------------------------------------------------------------------ No results for input(s): TSH, T4TOTAL, T3FREE, THYROIDAB in the last 72 hours.  Invalid input(s):  FREET3   Coagulation profile No results for input(s): INR, PROTIME in the last 168 hours. ------------------------------------------------------------------------------------------------------------------- No results for input(s): DDIMER in the last 72 hours. -------------------------------------------------------------------------------------------------------------------  Cardiac Enzymes No results for input(s): CKMB, TROPONINI, MYOGLOBIN in the last 168 hours.  Invalid input(s): CK ------------------------------------------------------------------------------------------------------------------ Invalid input(s): POCBNP  ---------------------------------------------------------------------------------------------------------------  Urinalysis    Component Value Date/Time   COLORURINE YELLOW (A) 10/16/2017 0627   APPEARANCEUR CLOUDY (A) 10/16/2017 0627   LABSPEC 1.020 10/16/2017 0627   PHURINE 6.0 10/16/2017 0627   GLUCOSEU >=500 (A) 10/16/2017 0627   HGBUR NEGATIVE 10/16/2017 0627   BILIRUBINUR NEGATIVE 10/16/2017 0627   KETONESUR 80 (A) 10/16/2017 0627   PROTEINUR 30 (A) 10/16/2017 0627   NITRITE NEGATIVE 10/16/2017 0627   LEUKOCYTESUR LARGE (A) 10/16/2017 0627     RADIOLOGY: No results found.  EKG: Orders placed or performed during the hospital encounter of 10/15/17  . EKG 12-Lead  . EKG 12-Lead  . EKG    IMPRESSION AND PLAN: Patient is 57 year old African-American female with diabetes presenting with nausea vomiting abdominal  1.  DKA Patient will be started on insulin drip IV fluid Monitor anion gap, once anion gap closes can be switched over to home regimen Check a  hemoglobin A1c  2.  Acute renal failure Discontinue lisinopril, and naproxen IV fluid This is due to dehydration  3.  Hypertension we will use as needed hydralazine  4.  Hyperkalemia patient will be receiving insulin that should bring the potassium down  5.  Abdominal pain likely due to  DKA if symptoms persist once DKA resolves will need a CT of the abdomen May have gastritis will place on IV Protonix  6.  Leukocytosis likely reactive due to #1  7.  Nicotine abuse smoking cessation provided, 4 minutes spent, I strongly recommended she stop smoking, patient refuses nicotine patch            All the records are reviewed and case discussed with ED provider. Management plans discussed with the patient, family and they are in agreement.  CODE STATUS: Code Status History    Date Active Date Inactive Code Status Order ID Comments User Context   10/16/2017 0212 10/17/2017 1427 Full Code [id number]  Fritzi Mandes, MD Inpatient   09/23/2017 2252 09/24/2017 1505 Full Code [id number]  Amelia Jo, MD Inpatient   05/15/2016 1745 05/16/2016 1749 Full Code [id number]  Dustin Flock, MD Inpatient   01/13/2016 0051 01/14/2016 1314 Full Code [id number]  Norval Morton, MD ED   01/08/2016 1916 01/09/2016 1704 Full Code [id number]  Theodoro Grist, MD Inpatient       TOTAL TIME TAKING CARE OF THIS PATIENT:55 minutes.    Dustin Flock M.D on 11/26/2017 at 12:42 PM  Between 7am to 6pm - Pager - [phone]  After 6pm go to [email or web address] - Proofreader  [facility]  [phone]  CC: Primary care physician; [facility] [place]

## 2017-11-27 LAB — CBC WITH DIFFERENTIAL/PLATELET
BASOS ABS: 0 10*3/uL (ref 0–0.1)
BASOS PCT: 1 %
EOS PCT: 1 %
Eosinophils Absolute: 0 10*3/uL (ref 0–0.7)
HCT: 40.9 % (ref 35.0–47.0)
Hemoglobin: 14.3 g/dL (ref 12.0–16.0)
Lymphocytes Relative: 19 %
Lymphs Abs: 1.3 10*3/uL (ref 1.0–3.6)
MCH: 32.4 pg (ref 26.0–34.0)
MCHC: 35 g/dL (ref 32.0–36.0)
MCV: 92.5 fL (ref 80.0–100.0)
MONO ABS: 0.5 10*3/uL (ref 0.2–0.9)
MONOS PCT: 7 %
NEUTROS ABS: 5 10*3/uL (ref 1.4–6.5)
Neutrophils Relative %: 72 %
PLATELETS: 244 10*3/uL (ref 150–440)
RBC: 4.42 MIL/uL (ref 3.80–5.20)
RDW: 13.5 % (ref 11.5–14.5)
WBC: 6.9 10*3/uL (ref 3.6–11.0)

## 2017-11-27 LAB — BASIC METABOLIC PANEL
ANION GAP: 11 (ref 5–15)
BUN: 25 mg/dL — ABNORMAL HIGH (ref 6–20)
CALCIUM: 8.3 mg/dL — AB (ref 8.9–10.3)
CO2: 21 mmol/L — AB (ref 22–32)
CREATININE: 1.15 mg/dL — AB (ref 0.44–1.00)
Chloride: 95 mmol/L — ABNORMAL LOW (ref 101–111)
GFR, EST NON AFRICAN AMERICAN: 52 mL/min — AB (ref >60)
GLUCOSE: 351 mg/dL — AB (ref 65–99)
Potassium: 4.1 mmol/L (ref 3.5–5.1)
Sodium: 127 mmol/L — ABNORMAL LOW (ref 135–145)

## 2017-11-27 LAB — GLUCOSE, CAPILLARY
Glucose-Capillary: 367 mg/dL — ABNORMAL HIGH (ref 65–99)
Glucose-Capillary: 351 mg/dL — ABNORMAL HIGH (ref 65–99)

## 2017-11-27 NOTE — Discharge Summary (Signed)
[facility] NAME: Pamela Lane    MR#:  [id number]  DATE OF BIRTH:  Dec 07, 1960  DATE OF ADMISSION:  11/26/2017 ADMITTING PHYSICIAN: Dustin Flock, MD  DATE OF DISCHARGE: 11/27/2017  PRIMARY CARE PHYSICIAN: [facility] [place]    ADMISSION DIAGNOSIS:  Hyperkalemia [E87.5] Hyperglycemia [R73.9] Gastrointestinal hemorrhage, unspecified gastrointestinal hemorrhage type [K92.2] Diabetic ketoacidosis without coma associated with other specified diabetes mellitus ([place]) [E13.10]  DISCHARGE DIAGNOSIS:  Active Problems:   DKA (diabetic ketoacidoses) ([place])   SECONDARY DIAGNOSIS:   Past Medical History:  Diagnosis Date  . Asthma   . Diabetes mellitus without complication ([place])   . Hypertension   . Mental disorder    pt reports 'I have all of them mental disorders'    HOSPITAL COURSE:   57 year old female with past medical history of diabetes, hypertension, history of previous DKA, medical noncompliance, ongoing tobacco abuse who presents to the hospital due to abdominal pain and was noted to be in acute diabetic ketoacidosis.  1.  Acute diabetic ketoacidosis-patient presented to the hospital with blood sugars greater than 900, elevated anion gap.  This is secondary to patient's noncompliance with a carb controlled diet.  She says she is compliant with her insulin. - Patient was initially admitted to the ICU placed on a insulin drip, given IV fluids.  Her anion gap is now closed.  Her blood sugars are stable.  Her electrolytes are stable.  She is tolerating p.o. well.  She is being discharged back on her insulin 70/30 along with her metformin.  She should have follow-up with her primary care physician in the next 1 to 2 weeks.  2.  Acute kidney injury-secondary to the acute diabetic ketoacidosis.  Much improved as blood sugars have corrected and also with IV fluid hydration.  Creatinine is close to baseline now.  3.   Hyperkalemia-secondary to the acidosis.  It is also improved and corrected now with IV fluid hydration and as her blood sugars and acidosis is corrected.  4.  Tobacco abuse-while in the hospital patient was placed in a teen patch, and strongly advised to quit smoking.  5.  Essential hypertension-patient will resume her lisinopril.  DISCHARGE CONDITIONS:   Stable  CONSULTS OBTAINED:    DRUG ALLERGIES:  No Known Allergies  DISCHARGE MEDICATIONS:   Allergies as of 11/27/2017   No Known Allergies     Medication List    TAKE these medications   albuterol 108 (90 Base) MCG/ACT inhaler Commonly known as:  PROVENTIL HFA;VENTOLIN HFA Inhale 2 puffs into the lungs every 6 (six) hours as needed for wheezing or shortness of breath.   aspirin EC 81 MG tablet Take 81 mg by mouth daily.   blood glucose meter kit and supplies Kit Dispense based on patient and insurance preference. Use up to four times daily as directed. (FOR ICD-9 250.00, 250.01).   buPROPion 150 MG 12 hr tablet Commonly known as:  WELLBUTRIN SR Take 150 mg by mouth 2 (two) times daily.   insulin NPH-regular Human (70-30) 100 UNIT/ML injection Commonly known as:  NOVOLIN 70/30 Inject 22 Units into the skin 2 (two) times daily with a meal.   lisinopril 10 MG tablet Commonly known as:  PRINIVIL Take 1 tablet (10 mg total) by mouth daily.   metFORMIN 500 MG tablet Commonly known as:  GLUCOPHAGE Take 1 tablet (500 mg total) by mouth 2 (two) times daily with a meal.   naproxen 500  MG tablet Commonly known as:  NAPROSYN Take 500 mg by mouth 2 (two) times daily with a meal.         DISCHARGE INSTRUCTIONS:   DIET:  Cardiac diet and Diabetic diet  DISCHARGE CONDITION:  Stable  ACTIVITY:  Activity as tolerated  OXYGEN:  Home Oxygen: No.   Oxygen Delivery: room air  DISCHARGE LOCATION:  home   If you experience worsening of your admission symptoms, develop shortness of breath, life threatening  emergency, suicidal or homicidal thoughts you must seek medical attention immediately by calling 911 or calling your MD immediately  if symptoms less severe.  You Must read complete instructions/literature along with all the possible adverse reactions/side effects for all the Medicines you take and that have been prescribed to you. Take any new Medicines after you have completely understood and accpet all the possible adverse reactions/side effects.   Please note  You were cared for by a hospitalist during your hospital stay. If you have any questions about your discharge medications or the care you received while you were in the hospital after you are discharged, you can call the unit and asked to speak with the hospitalist on call if the hospitalist that took care of you is not available. Once you are discharged, your primary care physician will handle any further medical issues. Please note that NO REFILLS for any discharge medications will be authorized once you are discharged, as it is imperative that you return to your primary care physician (or establish a relationship with a primary care physician if you do not have one) for your aftercare needs so that they can reassess your need for medications and monitor your lab values.     Today   No further abdominal pain, nausea, vomiting.  Blood sugar stable.  Patient tolerating p.o. well without any issues.  Will discharge home later today.  VITAL SIGNS:  Blood pressure 138/76, pulse (!) 105, temperature (!) 97.5 F (36.4 C), temperature source Oral, resp. rate 17, height '5\' 3"'  (1.6 m), weight 94.5 kg (208 lb 5.4 oz), SpO2 100 %.  I/O:    Intake/Output Summary (Last 24 hours) at 11/27/2017 1305 Last data filed at 11/27/2017 0900 Gross per 24 hour  Intake 1702.2 ml  Output 375 ml  Net 1327.2 ml    PHYSICAL EXAMINATION:  GENERAL:  57 y.o.-year-old patient lying in the bed with no acute distress.  EYES: Pupils equal, round, reactive to  light and accommodation. No scleral icterus. Extraocular muscles intact.  HEENT: Head atraumatic, normocephalic. Oropharynx and nasopharynx clear.  NECK:  Supple, no jugular venous distention. No thyroid enlargement, no tenderness.  LUNGS: Normal breath sounds bilaterally, no wheezing, rales,rhonchi. No use of accessory muscles of respiration.  CARDIOVASCULAR: S1, S2 normal. No murmurs, rubs, or gallops.  ABDOMEN: Soft, non-tender, non-distended. Bowel sounds present. No organomegaly or mass.  EXTREMITIES: No pedal edema, cyanosis, or clubbing.  NEUROLOGIC: Cranial nerves II through XII are intact. No focal motor or sensory defecits b/l.  PSYCHIATRIC: The patient is alert and oriented x 3.  SKIN: No obvious rash, lesion, or ulcer.   DATA REVIEW:   CBC Recent Labs  Lab 11/27/17 0703  WBC 6.9  HGB 14.3  HCT 40.9  PLT 244    Chemistries  Recent Labs  Lab 11/26/17 1106  11/27/17 0703  NA 119*   < > 127*  K 6.1*   < > 4.1  CL 81*   < > 95*  CO2 13*   < >  21*  GLUCOSE 893*   < > 351*  BUN 31*   < > 25*  CREATININE 1.88*   < > 1.15*  CALCIUM 9.3   < > 8.3*  AST 19  --   --   ALT 23  --   --   ALKPHOS 95  --   --   BILITOT 1.5*  --   --    < > = values in this interval not displayed.    Cardiac Enzymes No results for input(s): TROPONINI in the last 168 hours.  Microbiology Results  Results for orders placed or performed during the hospital encounter of 11/26/17  MRSA PCR Screening     Status: None   Collection Time: 11/26/17  2:28 PM  Result Value Ref Range Status   MRSA by PCR NEGATIVE NEGATIVE Final    Comment:        The GeneXpert MRSA Assay (FDA approved for NASAL specimens only), is one component of a comprehensive MRSA colonization surveillance program. It is not intended to diagnose MRSA infection nor to guide or monitor treatment for MRSA infections. Performed at [facility], [address], [place], [place] [address]     RADIOLOGY:  No  results found.    Management plans discussed with the patient, family and they are in agreement.  CODE STATUS:     Code Status Orders  (From admission, onward)        Start     Ordered   11/26/17 1422  Full code  Continuous     11/26/17 1421  ] TOTAL TIME TAKING CARE OF THIS PATIENT: 40 minutes.    Henreitta Leber M.D on 11/27/2017 at 1:05 PM  Between 7am to 6pm - Pager - [phone]  After 6pm go to [email or web address] - [employer] [facility]  Office  [phone]  CC: Primary care physician; [facility], [place]

## 2017-11-27 NOTE — Progress Notes (Signed)
Report called to Bobby Rumpf.  Pt is going to room 226.  She remains alert and oriented, no complaints of pain.

## 2017-11-28 LAB — GLUCOSE, CAPILLARY: GLUCOSE-CAPILLARY: 279 mg/dL — AB (ref 65–99)

## 2018-02-16 ENCOUNTER — Emergency Department: Payer: Medicaid Other

## 2018-02-16 ENCOUNTER — Other Ambulatory Visit: Payer: Self-pay

## 2018-02-16 ENCOUNTER — Inpatient Hospital Stay: Payer: Medicaid Other

## 2018-02-16 ENCOUNTER — Encounter: Payer: Self-pay | Admitting: Emergency Medicine

## 2018-02-16 ENCOUNTER — Inpatient Hospital Stay
Admission: EM | Admit: 2018-02-16 | Discharge: 2018-02-18 | DRG: 638 | Disposition: A | Discharge status: Home / Self Care | Payer: Medicaid Other | Attending: Internal Medicine | Admitting: Internal Medicine

## 2018-02-16 DIAGNOSIS — E0810 Diabetes mellitus due to underlying condition with ketoacidosis without coma: Secondary | ICD-10-CM

## 2018-02-16 DIAGNOSIS — F419 Anxiety disorder, unspecified: Secondary | ICD-10-CM | POA: Diagnosis not present

## 2018-02-16 DIAGNOSIS — J45909 Unspecified asthma, uncomplicated: Secondary | ICD-10-CM | POA: Diagnosis present

## 2018-02-16 DIAGNOSIS — N3000 Acute cystitis without hematuria: Secondary | ICD-10-CM

## 2018-02-16 DIAGNOSIS — Z91128 Patient's intentional underdosing of medication regimen for other reason: Secondary | ICD-10-CM

## 2018-02-16 DIAGNOSIS — Y92009 Unspecified place in unspecified non-institutional (private) residence as the place of occurrence of the external cause: Secondary | ICD-10-CM | POA: Diagnosis not present

## 2018-02-16 DIAGNOSIS — Z794 Long term (current) use of insulin: Secondary | ICD-10-CM

## 2018-02-16 DIAGNOSIS — T383X6A Underdosing of insulin and oral hypoglycemic [antidiabetic] drugs, initial encounter: Secondary | ICD-10-CM | POA: Diagnosis present

## 2018-02-16 DIAGNOSIS — E1010 Type 1 diabetes mellitus with ketoacidosis without coma: Secondary | ICD-10-CM

## 2018-02-16 DIAGNOSIS — N3001 Acute cystitis with hematuria: Secondary | ICD-10-CM | POA: Diagnosis not present

## 2018-02-16 DIAGNOSIS — N179 Acute kidney failure, unspecified: Secondary | ICD-10-CM | POA: Diagnosis present

## 2018-02-16 DIAGNOSIS — F1490 Cocaine use, unspecified, uncomplicated: Secondary | ICD-10-CM | POA: Diagnosis present

## 2018-02-16 DIAGNOSIS — Z7982 Long term (current) use of aspirin: Secondary | ICD-10-CM

## 2018-02-16 DIAGNOSIS — Z9114 Patient's other noncompliance with medication regimen: Secondary | ICD-10-CM | POA: Diagnosis not present

## 2018-02-16 DIAGNOSIS — E1110 Type 2 diabetes mellitus with ketoacidosis without coma: Principal | ICD-10-CM | POA: Diagnosis present

## 2018-02-16 DIAGNOSIS — R0789 Other chest pain: Secondary | ICD-10-CM | POA: Diagnosis not present

## 2018-02-16 DIAGNOSIS — Z90710 Acquired absence of both cervix and uterus: Secondary | ICD-10-CM

## 2018-02-16 DIAGNOSIS — E860 Dehydration: Secondary | ICD-10-CM | POA: Diagnosis present

## 2018-02-16 DIAGNOSIS — Z9111 Patient's noncompliance with dietary regimen: Secondary | ICD-10-CM

## 2018-02-16 DIAGNOSIS — F17210 Nicotine dependence, cigarettes, uncomplicated: Secondary | ICD-10-CM | POA: Diagnosis present

## 2018-02-16 DIAGNOSIS — E870 Hyperosmolality and hypernatremia: Secondary | ICD-10-CM | POA: Diagnosis present

## 2018-02-16 DIAGNOSIS — B379 Candidiasis, unspecified: Secondary | ICD-10-CM

## 2018-02-16 DIAGNOSIS — Z8249 Family history of ischemic heart disease and other diseases of the circulatory system: Secondary | ICD-10-CM

## 2018-02-16 DIAGNOSIS — I10 Essential (primary) hypertension: Secondary | ICD-10-CM | POA: Diagnosis present

## 2018-02-16 DIAGNOSIS — R2241 Localized swelling, mass and lump, right lower limb: Secondary | ICD-10-CM | POA: Diagnosis present

## 2018-02-16 LAB — BASIC METABOLIC PANEL
ANION GAP: 22 — AB (ref 5–15)
ANION GAP: 17 — AB (ref 5–15)
ANION GAP: 8 (ref 5–15)
BUN: 22 mg/dL — ABNORMAL HIGH (ref 6–20)
BUN: 19 mg/dL (ref 6–20)
BUN: 20 mg/dL (ref 6–20)
CALCIUM: 9.3 mg/dL (ref 8.9–10.3)
CALCIUM: 9.1 mg/dL (ref 8.9–10.3)
CO2: 14 mmol/L — AB (ref 22–32)
CO2: 16 mmol/L — ABNORMAL LOW (ref 22–32)
CO2: 21 mmol/L — ABNORMAL LOW (ref 22–32)
CREATININE: 1.6 mg/dL — AB (ref 0.44–1.00)
Calcium: 9.3 mg/dL (ref 8.9–10.3)
Chloride: 89 mmol/L — ABNORMAL LOW (ref 98–111)
Chloride: 96 mmol/L — ABNORMAL LOW (ref 98–111)
Chloride: 104 mmol/L (ref 98–111)
Creatinine, Ser: 1.19 mg/dL — ABNORMAL HIGH (ref 0.44–1.00)
Creatinine, Ser: 0.88 mg/dL (ref 0.44–1.00)
GFR calc Af Amer: 58 mL/min — ABNORMAL LOW (ref >60)
GFR calc Af Amer: >60 mL/min (ref >60)
GFR calc non Af Amer: 50 mL/min — ABNORMAL LOW (ref >60)
GFR, EST AFRICAN AMERICAN: 41 mL/min — AB (ref >60)
GFR, EST NON AFRICAN AMERICAN: 35 mL/min — AB (ref >60)
GLUCOSE: 500 mg/dL — AB (ref 70–99)
GLUCOSE: 222 mg/dL — AB (ref 70–99)
Glucose, Bld: 716 mg/dL (ref 70–99)
Potassium: 4.7 mmol/L (ref 3.5–5.1)
Potassium: 5.1 mmol/L (ref 3.5–5.1)
Potassium: 4.8 mmol/L (ref 3.5–5.1)
SODIUM: 125 mmol/L — AB (ref 135–145)
Sodium: 129 mmol/L — ABNORMAL LOW (ref 135–145)
Sodium: 133 mmol/L — ABNORMAL LOW (ref 135–145)

## 2018-02-16 LAB — RAPID HIV SCREEN (HIV 1/2 AB+AG)
HIV 1/2 Antibodies: NONREACTIVE
HIV-1 P24 Antigen - HIV24: NONREACTIVE

## 2018-02-16 LAB — URINALYSIS, COMPLETE (UACMP) WITH MICROSCOPIC
BILIRUBIN URINE: NEGATIVE
KETONES UR: 80 mg/dL — AB
NITRITE: NEGATIVE
Protein, ur: NEGATIVE mg/dL
SPECIFIC GRAVITY, URINE: 1.025 (ref 1.005–1.030)
pH: 5 (ref 5.0–8.0)

## 2018-02-16 LAB — URINE DRUG SCREEN, QUALITATIVE (ARMC ONLY)
AMPHETAMINES, UR SCREEN: NOT DETECTED
Barbiturates, Ur Screen: NOT DETECTED
CANNABINOID 50 NG, UR ~~LOC~~: NOT DETECTED
Cocaine Metabolite,Ur ~~LOC~~: POSITIVE — AB
MDMA (ECSTASY) UR SCREEN: NOT DETECTED
Methadone Scn, Ur: NOT DETECTED
OPIATE, UR SCREEN: NOT DETECTED
PHENCYCLIDINE (PCP) UR S: NOT DETECTED
Tricyclic, Ur Screen: NOT DETECTED

## 2018-02-16 LAB — TROPONIN I: Troponin I: <0.03 ng/mL (ref <0.03)

## 2018-02-16 LAB — GLUCOSE, CAPILLARY
GLUCOSE-CAPILLARY: 312 mg/dL — AB (ref 70–99)
Glucose-Capillary: 207 mg/dL — ABNORMAL HIGH (ref 70–99)
Glucose-Capillary: 207 mg/dL — ABNORMAL HIGH (ref 70–99)
Glucose-Capillary: 231 mg/dL — ABNORMAL HIGH (ref 70–99)
Glucose-Capillary: 252 mg/dL — ABNORMAL HIGH (ref 70–99)
Glucose-Capillary: 304 mg/dL — ABNORMAL HIGH (ref 70–99)
Glucose-Capillary: 321 mg/dL — ABNORMAL HIGH (ref 70–99)
Glucose-Capillary: 584 mg/dL (ref 70–99)
Glucose-Capillary: >600 mg/dL (ref 70–99)
Glucose-Capillary: >600 mg/dL (ref 70–99)

## 2018-02-16 LAB — BLOOD GAS, VENOUS
ACID-BASE DEFICIT: 12.5 mmol/L — AB (ref 0.0–2.0)
BICARBONATE: 13.2 mmol/L — AB (ref 20.0–28.0)
O2 SAT: 53.8 %
PH VEN: 7.25 (ref 7.250–7.430)
Patient temperature: 37
pCO2, Ven: 30 mmHg — ABNORMAL LOW (ref 44.0–60.0)
pO2, Ven: 34 mmHg (ref 32.0–45.0)

## 2018-02-16 LAB — CBC
HCT: 50.3 % — ABNORMAL HIGH (ref 35.0–47.0)
HEMOGLOBIN: 17.2 g/dL — AB (ref 12.0–16.0)
MCH: 32.4 pg (ref 26.0–34.0)
MCHC: 34.2 g/dL (ref 32.0–36.0)
MCV: 94.7 fL (ref 80.0–100.0)
PLATELETS: 293 10*3/uL (ref 150–440)
RBC: 5.31 MIL/uL — ABNORMAL HIGH (ref 3.80–5.20)
RDW: 12.4 % (ref 11.5–14.5)
WBC: 10.1 10*3/uL (ref 3.6–11.0)

## 2018-02-16 LAB — MRSA PCR SCREENING: MRSA BY PCR: NEGATIVE

## 2018-02-16 LAB — BETA-HYDROXYBUTYRIC ACID: Beta-Hydroxybutyric Acid: 4.82 mmol/L — ABNORMAL HIGH (ref 0.05–0.27)

## 2018-02-16 LAB — MAGNESIUM: MAGNESIUM: 2.2 mg/dL (ref 1.7–2.4)

## 2018-02-16 MED ORDER — POTASSIUM CHLORIDE CRYS ER 20 MEQ PO TBCR
80.0000 meq | EXTENDED_RELEASE_TABLET | Freq: Once | ORAL | Status: AC
  Administered 2018-02-16: 80 meq via ORAL
  Filled 2018-02-16: qty 4

## 2018-02-16 MED ORDER — INSULIN ASPART 100 UNIT/ML ~~LOC~~ SOLN
0.0000 [IU] | Freq: Three times a day (TID) | SUBCUTANEOUS | Status: DC
  Administered 2018-02-17: 15 [IU] via SUBCUTANEOUS
  Administered 2018-02-17: 5 [IU] via SUBCUTANEOUS
  Administered 2018-02-17: 11 [IU] via SUBCUTANEOUS
  Filled 2018-02-16 (×3): qty 1

## 2018-02-16 MED ORDER — LISINOPRIL 20 MG PO TABS
10.0000 mg | ORAL_TABLET | Freq: Every day | ORAL | Status: DC
  Administered 2018-02-17: 10 mg via ORAL
  Filled 2018-02-16 (×2): qty 1

## 2018-02-16 MED ORDER — ONDANSETRON HCL 4 MG/2ML IJ SOLN
4.0000 mg | Freq: Four times a day (QID) | INTRAMUSCULAR | Status: DC | PRN
Start: 2018-02-16 — End: 2018-02-18

## 2018-02-16 MED ORDER — FLUCONAZOLE 50 MG PO TABS
150.0000 mg | ORAL_TABLET | Freq: Once | ORAL | Status: AC
  Administered 2018-02-16: 150 mg via ORAL
  Filled 2018-02-16: qty 1

## 2018-02-16 MED ORDER — SULFAMETHOXAZOLE-TRIMETHOPRIM 800-160 MG PO TABS: 1.0000 | ORAL_TABLET | Freq: Once | ORAL | Status: DC

## 2018-02-16 MED ORDER — DEXTROSE-NACL 5-0.45 % IV SOLN
INTRAVENOUS | Status: DC
  Administered 2018-02-16 (×2): via INTRAVENOUS

## 2018-02-16 MED ORDER — ALBUTEROL SULFATE (2.5 MG/3ML) 0.083% IN NEBU: 2.5000 mg | INHALATION_SOLUTION | Freq: Four times a day (QID) | RESPIRATORY_TRACT | Status: DC | PRN

## 2018-02-16 MED ORDER — INSULIN ASPART 100 UNIT/ML ~~LOC~~ SOLN: 22.0000 [IU] | Freq: Two times a day (BID) | SUBCUTANEOUS | Status: DC

## 2018-02-16 MED ORDER — SODIUM CHLORIDE 0.9 % IV SOLN
INTRAVENOUS | Status: DC
  Administered 2018-02-16: 5.4 [IU]/h via INTRAVENOUS
  Filled 2018-02-16 (×2): qty 1

## 2018-02-16 MED ORDER — INSULIN REGULAR HUMAN 100 UNIT/ML IJ SOLN: INTRAMUSCULAR | Status: DC

## 2018-02-16 MED ORDER — ACETAMINOPHEN 325 MG PO TABS: 650.0000 mg | ORAL_TABLET | Freq: Four times a day (QID) | ORAL | Status: DC | PRN

## 2018-02-16 MED ORDER — POTASSIUM CHLORIDE 10 MEQ/100ML IV SOLN
10.0000 meq | INTRAVENOUS | Status: DC
  Filled 2018-02-16 (×2): qty 100

## 2018-02-16 MED ORDER — INSULIN ASPART 100 UNIT/ML ~~LOC~~ SOLN
0.0000 [IU] | Freq: Every day | SUBCUTANEOUS | Status: DC
  Administered 2018-02-16: 4 [IU] via SUBCUTANEOUS
  Administered 2018-02-17: 2 [IU] via SUBCUTANEOUS
  Filled 2018-02-16 (×3): qty 1

## 2018-02-16 MED ORDER — CEFTRIAXONE SODIUM 1 G IJ SOLR
1.0000 g | INTRAMUSCULAR | Status: DC
  Administered 2018-02-17: 1 g via INTRAVENOUS
  Filled 2018-02-16: qty 10
  Filled 2018-02-16: qty 1

## 2018-02-16 MED ORDER — MAGNESIUM SULFATE 2 GM/50ML IV SOLN
2.0000 g | Freq: Once | INTRAVENOUS | Status: DC
  Filled 2018-02-16: qty 50

## 2018-02-16 MED ORDER — LORAZEPAM 2 MG/ML IJ SOLN
2.0000 mg | Freq: Once | INTRAMUSCULAR | Status: AC
  Administered 2018-02-16: 2 mg via INTRAVENOUS
  Filled 2018-02-16: qty 1

## 2018-02-16 MED ORDER — INSULIN ASPART PROT & ASPART (70-30 MIX) 100 UNIT/ML ~~LOC~~ SUSP
22.0000 [IU] | Freq: Two times a day (BID) | SUBCUTANEOUS | Status: DC
  Administered 2018-02-16: 22 [IU] via SUBCUTANEOUS
  Filled 2018-02-16: qty 10

## 2018-02-16 MED ORDER — SODIUM CHLORIDE 0.9 % IV SOLN
INTRAVENOUS | Status: DC
  Administered 2018-02-16: 15:00:00 via INTRAVENOUS

## 2018-02-16 MED ORDER — ALPRAZOLAM 0.5 MG PO TABS
0.5000 mg | ORAL_TABLET | Freq: Once | ORAL | Status: AC
  Administered 2018-02-16: 0.5 mg via ORAL
  Filled 2018-02-16: qty 1

## 2018-02-16 MED ORDER — ASPIRIN EC 81 MG PO TBEC
81.0000 mg | DELAYED_RELEASE_TABLET | Freq: Every day | ORAL | Status: DC
  Administered 2018-02-17: 81 mg via ORAL
  Filled 2018-02-16: qty 1

## 2018-02-16 MED ORDER — SODIUM CHLORIDE 0.9 % IV BOLUS
1000.0000 mL | Freq: Once | INTRAVENOUS | Status: AC
  Administered 2018-02-16: 1000 mL via INTRAVENOUS

## 2018-02-16 MED ORDER — INSULIN GLARGINE 100 UNIT/ML ~~LOC~~ SOLN
20.0000 [IU] | Freq: Once | SUBCUTANEOUS | Status: DC
  Filled 2018-02-16: qty 0.2

## 2018-02-16 MED ORDER — BUPROPION HCL ER (SR) 150 MG PO TB12
150.0000 mg | ORAL_TABLET | Freq: Two times a day (BID) | ORAL | Status: DC
  Administered 2018-02-17 (×2): 150 mg via ORAL
  Filled 2018-02-16 (×6): qty 1

## 2018-02-16 MED ORDER — SODIUM CHLORIDE 0.9 % IV SOLN
1.0000 g | Freq: Once | INTRAVENOUS | Status: AC
  Administered 2018-02-16: 1 g via INTRAVENOUS
  Filled 2018-02-16: qty 10

## 2018-02-16 MED ORDER — ALBUTEROL SULFATE HFA 108 (90 BASE) MCG/ACT IN AERS: 2.0000 | INHALATION_SPRAY | Freq: Four times a day (QID) | RESPIRATORY_TRACT | Status: DC | PRN

## 2018-02-16 MED ORDER — INSULIN ASPART PROT & ASPART (70-30 MIX) 100 UNIT/ML ~~LOC~~ SUSP
24.0000 [IU] | Freq: Two times a day (BID) | SUBCUTANEOUS | Status: DC
  Administered 2018-02-17: 24 [IU] via SUBCUTANEOUS
  Filled 2018-02-16 (×2): qty 10

## 2018-02-16 MED ORDER — ENOXAPARIN SODIUM 40 MG/0.4ML ~~LOC~~ SOLN
40.0000 mg | SUBCUTANEOUS | Status: DC
  Administered 2018-02-16 – 2018-02-17 (×2): 40 mg via SUBCUTANEOUS
  Filled 2018-02-16 (×2): qty 0.4

## 2018-02-16 MED ORDER — TRAZODONE HCL 100 MG PO TABS
100.0000 mg | ORAL_TABLET | Freq: Every evening | ORAL | Status: DC | PRN
  Administered 2018-02-17: 100 mg via ORAL
  Filled 2018-02-16: qty 1

## 2018-02-16 MED ORDER — DEXTROSE-NACL 5-0.45 % IV SOLN: INTRAVENOUS | Status: DC

## 2018-02-16 MED ORDER — SODIUM CHLORIDE 0.9 % IV SOLN: INTRAVENOUS | Status: AC

## 2018-02-16 NOTE — ED Triage Notes (Signed)
Pt arrived via POV with reports of elevated blood sugars and an abscess to right side.  Pt was sent here by [facility] and was supposed to come  Yesterday but states she was too tired.  Pt states "everything is bothering me"   Pt states her sugar readings were elevated yesterday. Pt currently drinking gingerale in triage and eating chick-fil-a.  Pt did not take any insulin today.

## 2018-02-16 NOTE — Consult Note (Signed)
Name: Pamela Lane MRN: [id number] DOB: 11-23-1960    ADMISSION DATE:  02/16/2018 CONSULTATION DATE:  02/16/2018  REFERRING MD :  Dr. Jerelyn Charles  CHIEF COMPLAINT:  Hyperglycemia, DKA, UTI, Abscess  BRIEF PATIENT DESCRIPTION:  57 y.o. Female with DKA in setting of UTI and med noncompliance, AKI in setting of dehydration, and ? Abscess of Right hip.  SIGNIFICANT EVENTS  02/16/18>> Admitted to [facility] Stepdown unit  STUDIES:  CXR 8/8>> Chronic bronchitic-reactive airway changes, stable. No pneumonia, CHF, nor other acute cardiopulmonary abnormality.    HISTORY OF PRESENT ILLNESS:   Pamela Lane is a 56 y.o. Female with PMH as listed below, who presented to [facility] ED on 02/16/18 with c/o elevated blood sugars and general malaise.  She reports being noncompliant with her home insulin regimen, and being nonadherent to a diabetic diet.  She denies any sick contacts.  Initial workup in the ED revealed venous pH 7/25, serum CO2 14, Anion gap 22, glucose 716, serum Beta-Hydroxybutyric acid 4.82, Na 125, Creatinine 1.6.  UA is positive for ketones and positive for UTI. CXR negative for acute disease.  She is being admitted to [place] unit for treatment of DKA, UTI, and AKI.  PCCM is consulted for further management.  PAST MEDICAL HISTORY :   has a past medical history of Asthma, Diabetes mellitus without complication ([place]), Hypertension, and Mental disorder.  has a past surgical history that includes Abdominal hysterectomy. Prior to Admission medications   Medication Sig Start Date End Date Taking? Authorizing Provider  albuterol (PROVENTIL HFA;VENTOLIN HFA) 108 (90 Base) MCG/ACT inhaler Inhale 2 puffs into the lungs every 6 (six) hours as needed for wheezing or shortness of breath.   Yes [provider]  aspirin EC 81 MG tablet Take 81 mg by mouth daily.   Yes [provider]  buPROPion (WELLBUTRIN SR) 150 MG 12 hr tablet Take 150 mg by mouth 2 (two) times daily.   Yes  [provider]  lisinopril (PRINIVIL) 10 MG tablet Take 1 tablet (10 mg total) by mouth daily. 04/24/17  Yes Delman Kitten, MD  metFORMIN (GLUCOPHAGE) 500 MG tablet Take 1 tablet (500 mg total) by mouth 2 (two) times daily with a meal. 09/24/17  Yes Mody, Sital, MD  blood glucose meter kit and supplies KIT Dispense based on patient and insurance preference. Use up to four times daily as directed. (FOR ICD-9 250.00, 250.01). 10/17/17   Saundra Shelling, MD  insulin NPH-regular Human (NOVOLIN 70/30) (70-30) 100 UNIT/ML injection Inject 22 Units into the skin 2 (two) times daily with a meal. 09/24/17 11/23/17  Bettey Costa, MD   No Known Allergies  FAMILY HISTORY:  family history includes Hypertension in her mother. SOCIAL HISTORY:  reports that she has been smoking cigarettes. She has been smoking about 3.00 packs per day. She has never used smokeless tobacco. She reports that she drinks about 40.0 standard drinks of alcohol per week. She reports that she has current or past drug history. Drugs: Marijuana and Cocaine.  REVIEW OF SYSTEMS:  Positives in BOLD Constitutional: Negative for fever, chills, weight loss, +malaise/fatigue and diaphoresis.  HENT: Negative for hearing loss, ear pain, nosebleeds, congestion, sore throat, neck pain, tinnitus and ear discharge.   Eyes: Negative for blurred vision, double vision, photophobia, pain, discharge and redness.  Respiratory: Negative for cough, hemoptysis, sputum production, +shortness of breath, wheezing and stridor.   Cardiovascular: Negative for chest pain, palpitations, orthopnea, claudication, leg swelling and PND.  Gastrointestinal: Negative for heartburn, +  nausea, vomiting, abdominal pain, diarrhea, constipation, blood in stool and melena.  Genitourinary: Negative for dysuria, urgency, frequency, hematuria and flank pain.  Musculoskeletal: Negative for myalgias, back pain, joint pain and falls.  Skin: Negative for itching and rash.    Neurological: Negative for dizziness, tingling, tremors, sensory change, speech change, focal weakness, seizures, loss of consciousness, weakness and headaches.  Endo/Heme/Allergies: Negative for environmental allergies and polydipsia. Does not bruise/bleed easily.  SUBJECTIVE:  Pt states she feels slightly SOB, no wheezing Reports Nausea On Room air  VITAL SIGNS: Temp:  [98.2 F (36.8 C)-98.6 F (37 C)] 98.6 F (37 C) (08/08 1322) Pulse Rate:  [104-124] 118 (08/08 1322) Resp:  [18-19] 18 (08/08 1322) BP: (127-158)/(84-103) 158/84 (08/08 1322) SpO2:  [98 %-100 %] 100 % (08/08 1322) Weight:  [84.9 kg-85.2 kg] 85.2 kg (08/08 1322)  PHYSICAL EXAMINATION: General:  Acutely ill appearing female, laying in bed, on room air, in no acute ditress Neuro:  A&O x4, follows commands, no focal deficits HEENT:  Atraumatic, normocephalic, No JVD Cardiovascular:  Tachycardia, RRR, s1s2, no M/R/G Lungs:  Tachypnea, coarse breath sounds bilaterally, even, symmetrical Abdomen:  Soft, non-tender, non-distended, BS x4 Musculoskeletal:  Normal bulk and tone, soft mass to R hip Skin:  No obvious rashes, lesions, or ulcerations  Recent Labs  Lab 02/16/18 1017  NA 125*  K 4.7  CL 89*  CO2 14*  BUN 22*  CREATININE 1.60*  GLUCOSE 716*   Recent Labs  Lab 02/16/18 1017  HGB 17.2*  HCT 50.3*  WBC 10.1  PLT 293   Dg Chest 2 View  Result Date: 02/16/2018 CLINICAL DATA:  Elevated blood sugar, fatigue. Current smoker. History of asthma. EXAM: CHEST - 2 VIEW COMPARISON:  Portable chest x-ray of October 15, 2017 FINDINGS: The lungs are adequately inflated. The interstitial markings are mildly prominent. There is no alveolar infiltrate or pleural effusion. The heart and pulmonary vascularity are normal. The trachea is midline. The bony thorax exhibits no acute abnormality. IMPRESSION: Chronic bronchitic-reactive airway changes, stable. No pneumonia, CHF, nor other acute cardiopulmonary abnormality.  Electronically Signed   By: David  Martinique M.D.   On: 02/16/2018 12:20    ASSESSMENT / PLAN:  A: Acute DKA in setting of med noncompliance & UTI UTI -UA + for large leukocytes, bacteria, budding yeast AKI in setting of Dehydration Hypertonic Hypernatremia -Serum Na 125, glucose 716 Chronic asthma, no acute exacerbation Right hip mass/abscess Hx: HTN  P: Aggressive IVF IV Insulin gtt Follow BMP q4h DKA protocol Empiric Rocephin Received one time dose Fluconazole Follow urine culture Consult Diabetes coordinator, appreciate input Monitor I&O's / urinary output Ensure adequate renal perfusion Avoid nephrotoxic agents as able Replace electrolytes as indicated Corrected Na based on Glucose is 134.6>> Continue IVF Prn Bronchodilators Ultrasound R hip pending Continue home lisinopril   VTE prophylaxis: Lovenox Disposition: Stepdown Goals of care: Full code     Darel Hong, [facility] Pager: [phone]   02/16/2018, 1:49 PM

## 2018-02-16 NOTE — H&P (Signed)
[place] [place] NAME: Pamela Lane    MR#:  [id number]  DATE OF BIRTH:  [birth year]  DATE OF ADMISSION:  02/16/2018  PRIMARY CARE PHYSICIAN: [facility] [place]   REQUESTING/REFERRING PHYSICIAN:   CHIEF COMPLAINT:   Chief Complaint  Patient presents with  . Hyperglycemia  . Abscess    HISTORY OF PRESENT ILLNESS: Pamela Lane  is a 57 y.o. female with a known history per below presented to the emergency room with high blood sugars,, patient admits to being noncompliant with her insulin regiment as well as drinking sugary drinks, in the emergency room patient was found to be in DKA with pH of 7.2, creatinine 1.6, sodium 125, chloride 89, urinalysis suspicious for UTI, patient evaluated emergency room, no apparent distress, resting comfortably in bed, patient now be admitted for acute DKA secondary to noncompliance of medical management and dietary indiscretion, acute UTI.  PAST MEDICAL HISTORY:   Past Medical History:  Diagnosis Date  . Asthma   . Diabetes mellitus without complication ([place])   . Hypertension   . Mental disorder    pt reports 'I have all of them mental disorders'    PAST SURGICAL HISTORY:  Past Surgical History:  Procedure Laterality Date  . ABDOMINAL HYSTERECTOMY      SOCIAL HISTORY:  Social History   Tobacco Use  . Smoking status: Current Every Day Smoker    Packs/day: 3.00    Types: Cigarettes  . Smokeless tobacco: Never Used  Substance Use Topics  . Alcohol use: Yes    Alcohol/week: 40.0 standard drinks    Types: 40 Cans of beer per week    Comment: 3 big bottles of liquor    FAMILY HISTORY:  Family History  Problem Relation Age of Onset  . Hypertension Mother     DRUG ALLERGIES: No Known Allergies  REVIEW OF SYSTEMS:   CONSTITUTIONAL: No fever, fatigue or weakness.  EYES: No blurred or double vision.  EARS, NOSE, AND THROAT: No tinnitus or ear pain.  RESPIRATORY: No  cough, shortness of breath, wheezing or hemoptysis.  CARDIOVASCULAR: No chest pain, orthopnea, edema.  GASTROINTESTINAL: No nausea, vomiting, diarrhea or abdominal pain.  GENITOURINARY: No dysuria, hematuria.  ENDOCRINE: No polyuria, nocturia,  HEMATOLOGY: No anemia, easy bruising or bleeding SKIN: No rash or lesion. MUSCULOSKELETAL: No joint pain or arthritis.   NEUROLOGIC: No tingling, numbness, weakness.  PSYCHIATRY: No anxiety or depression.   MEDICATIONS AT HOME:  Prior to Admission medications   Medication Sig Start Date End Date Taking? Authorizing Provider  albuterol (PROVENTIL HFA;VENTOLIN HFA) 108 (90 Base) MCG/ACT inhaler Inhale 2 puffs into the lungs every 6 (six) hours as needed for wheezing or shortness of breath.   Yes [provider]  aspirin EC 81 MG tablet Take 81 mg by mouth daily.   Yes [provider]  buPROPion (WELLBUTRIN SR) 150 MG 12 hr tablet Take 150 mg by mouth 2 (two) times daily.   Yes [provider]  lisinopril (PRINIVIL) 10 MG tablet Take 1 tablet (10 mg total) by mouth daily. 04/24/17  Yes Delman Kitten, MD  metFORMIN (GLUCOPHAGE) 500 MG tablet Take 1 tablet (500 mg total) by mouth 2 (two) times daily with a meal. 09/24/17  Yes Mody, Sital, MD  blood glucose meter kit and supplies KIT Dispense based on patient and insurance preference. Use up to four times daily as directed. (FOR ICD-9 250.00, 250.01). 10/17/17   Saundra Shelling, MD  insulin NPH-regular Human (NOVOLIN 70/30) (70-30) 100 UNIT/ML injection Inject 22 Units into the skin 2 (two) times daily with a meal. 09/24/17 11/23/17  Bettey Costa, MD      PHYSICAL EXAMINATION:   VITAL SIGNS: Blood pressure (!) 143/86, pulse (!) 124, temperature 98.2 F (36.8 C), temperature source Oral, resp. rate 18, height _0  (1.6 m), weight 84.9 kg, SpO2 100 %.  GENERAL:  57 y.o.-year-old patient lying in the bed with no acute distress.  EYES: Pupils equal, round, reactive to light and  accommodation. No scleral icterus. Extraocular muscles intact.  HEENT: Head atraumatic, normocephalic. Oropharynx and nasopharynx clear.  NECK:  Supple, no jugular venous distention. No thyroid enlargement, no tenderness.  LUNGS: Normal breath sounds bilaterally, no wheezing, rales,rhonchi or crepitation. No use of accessory muscles of respiration.  CARDIOVASCULAR: S1, S2 normal. No murmurs, rubs, or gallops.  ABDOMEN: Soft, nontender, nondistended. Bowel sounds present. No organomegaly or mass.  EXTREMITIES: No pedal edema, cyanosis, or clubbing.  NEUROLOGIC: Cranial nerves II through XII are intact. Muscle strength 5/5 in all extremities. Sensation intact. Gait not checked.  PSYCHIATRIC: The patient is alert and oriented x 3.  SKIN: No obvious rash, lesion, or ulcer.   LABORATORY PANEL:   CBC Recent Labs  Lab 02/16/18 1017  WBC 10.1  HGB 17.2*  HCT 50.3*  PLT 293  MCV 94.7  MCH 32.4  MCHC 34.2  RDW 12.4   ------------------------------------------------------------------------------------------------------------------  Chemistries  Recent Labs  Lab 02/16/18 1017  NA 125*  K 4.7  CL 89*  CO2 14*  GLUCOSE 716*  BUN 22*  CREATININE 1.60*  CALCIUM 9.3   ------------------------------------------------------------------------------------------------------------------ estimated creatinine clearance is 40.5 mL/min (A) (by C-G formula based on SCr of 1.6 mg/dL (H)). ------------------------------------------------------------------------------------------------------------------ No results for input(s): TSH, T4TOTAL, T3FREE, THYROIDAB in the last 72 hours.  Invalid input(s): FREET3   Coagulation profile No results for input(s): INR, PROTIME in the last 168 hours. ------------------------------------------------------------------------------------------------------------------- No results for input(s): DDIMER in the last 72  hours. -------------------------------------------------------------------------------------------------------------------  Cardiac Enzymes No results for input(s): CKMB, TROPONINI, MYOGLOBIN in the last 168 hours.  Invalid input(s): CK ------------------------------------------------------------------------------------------------------------------ Invalid input(s): POCBNP  ---------------------------------------------------------------------------------------------------------------  Urinalysis    Component Value Date/Time   COLORURINE YELLOW (A) 02/16/2018 1017   APPEARANCEUR CLOUDY (A) 02/16/2018 1017   LABSPEC 1.025 02/16/2018 1017   PHURINE 5.0 02/16/2018 1017   GLUCOSEU >=500 (A) 02/16/2018 1017   HGBUR MODERATE (A) 02/16/2018 1017   BILIRUBINUR NEGATIVE 02/16/2018 1017   KETONESUR 80 (A) 02/16/2018 1017   PROTEINUR NEGATIVE 02/16/2018 1017   NITRITE NEGATIVE 02/16/2018 1017   LEUKOCYTESUR LARGE (A) 02/16/2018 1017     RADIOLOGY: No results found.  EKG: Orders placed or performed during the hospital encounter of 11/26/17  . EKG 12-Lead  . EKG 12-Lead  . EKG    IMPRESSION AND PLAN: *Acute DKA due to noncompliance and dietary indiscretion *Acute probable UTI *Acute kidney injury due to dehydration *Chronic asthma without exacerbation  Admit to ICU on DKA protocol, IV fluids for rehydration, empiric Rocephin, follow-up on cultures, will need diabetic education, and continue close medical monitoring  All the records are reviewed and case discussed with ED provider. Management plans discussed with the patient, family and they are in agreement.  CODE STATUS:full Code Status History    Date Active Date Inactive Code Status Order ID Comments User Context   11/26/2017 1421 11/27/2017 1812 Full Code [id number]  Dustin Flock, MD Inpatient   10/16/[id number] 10/17/2017 1427 Full Code  [id number]  Fritzi Mandes, MD Inpatient   09/23/2017 2252 09/24/2017 1505 Full Code [id number]   Amelia Jo, MD Inpatient   05/15/2016 1745 05/16/2016 1749 Full Code [id number]  Dustin Flock, MD Inpatient   01/13/2016 0051 01/14/2016 1314 Full Code [id number]  Norval Morton, MD ED   01/08/2016 1916 01/09/2016 1704 Full Code [id number]  Theodoro Grist, MD Inpatient       TOTAL TIME TAKING CARE OF THIS PATIENT: 45 minutes.    Avel Peace Valicia Rief M.D on 02/16/2018   Between 7am to 6pm - Pager - [phone]  After 6pm go to [email or web address] - password [email or web address] [facility]  Office  [phone]  CC: Primary care physician; [facility] [place]   Note: This dictation was prepared with Diplomatic Services operational officer dictation along with smaller phrase technology. Any transcriptional errors that result from this process are unintentional.

## 2018-02-16 NOTE — ED Notes (Signed)
Pt heard screaming and cursing in [place]wait stating that she has been left back there for hours and hours and no one is checking on her and that she cannot breath.  Oxygen saturation at this time is 100% pt is tachycardic at 124 but during the time she was yelling and cursing.  Pt was encouraged to calm down but continued to curse at staff and be irrate.  Dr. Don PerkingVeronese notified, informed her that a CXR will be ordered and that the patient has already received fluids.

## 2018-02-16 NOTE — ED Provider Notes (Signed)
[facility] 2 Emergency Department Provider Note  ____________________________________________   First MD Initiated Contact with Patient 02/16/18 1128     (approximate)  I have reviewed the triage vital signs and the nursing notes.   HISTORY  Chief Complaint Hyperglycemia and Abscess    HPI Pamela Lane is a 57 y.o. female who self presents to the emergency department with elevated blood sugar polyuria and polydipsia.  She saw her primary care physician at [facility] yesterday who recommended she come to the emergency department however she felt to "tired" and waited until today.  She does report some dysuria but no fevers or chills.  No back pain.  She is concerned about a painful lump in her right hip that she is worried is infected.  Her symptoms have been insidious onset slowly progressive are now moderate to severe.  Nothing seems to make them better or worse.  She has a long-standing history of intermittent compliance with her insulin.    Past Medical History:  Diagnosis Date  . Asthma   . Diabetes mellitus without complication ([place])   . Hypertension   . Mental disorder    pt reports 'I have all of them mental disorders'    Patient Active Problem List   Diagnosis Date Noted  . Acute pancreatitis 05/15/2016  . AKI (acute kidney injury) ([place])   . Diabetic ketoacidosis without coma associated with type 2 diabetes mellitus ([place])   . Hyperglycemia due to type 2 diabetes mellitus ([place]) 01/13/2016  . UTI (urinary tract infection) 01/13/2016  . DKA (diabetic ketoacidoses) ([place]) 01/08/2016  . Hyponatremia 01/08/2016  . Dehydration 01/08/2016  . Urinary tract infection 01/08/2016  . Upper abdominal pain 01/08/2016  . Nausea & vomiting 01/08/2016    Past Surgical History:  Procedure Laterality Date  . ABDOMINAL HYSTERECTOMY      Prior to Admission medications   Medication Sig Start Date End Date Taking? Authorizing Provider    albuterol (PROVENTIL HFA;VENTOLIN HFA) 108 (90 Base) MCG/ACT inhaler Inhale 2 puffs into the lungs every 6 (six) hours as needed for wheezing or shortness of breath.   Yes [provider]  aspirin EC 81 MG tablet Take 81 mg by mouth daily.   Yes [provider]  buPROPion (WELLBUTRIN SR) 150 MG 12 hr tablet Take 150 mg by mouth 2 (two) times daily.   Yes [provider]  lisinopril (PRINIVIL) 10 MG tablet Take 1 tablet (10 mg total) by mouth daily. 04/24/17  Yes Delman Kitten, MD  metFORMIN (GLUCOPHAGE) 500 MG tablet Take 1 tablet (500 mg total) by mouth 2 (two) times daily with a meal. 09/24/17  Yes Mody, Sital, MD  blood glucose meter kit and supplies KIT Dispense based on patient and insurance preference. Use up to four times daily as directed. (FOR ICD-9 250.00, 250.01). 10/17/17   Saundra Shelling, MD  insulin NPH-regular Human (NOVOLIN 70/30) (70-30) 100 UNIT/ML injection Inject 22 Units into the skin 2 (two) times daily with a meal. 09/24/17 11/23/17  Bettey Costa, MD    Allergies Patient has no known allergies.  Family History  Problem Relation Age of Onset  . Hypertension Mother     Social History Social History   Tobacco Use  . Smoking status: Current Every Day Smoker    Packs/day: 3.00    Types: Cigarettes  . Smokeless tobacco: Never Used  Substance Use Topics  . Alcohol use: Yes    Alcohol/week: 40.0 standard drinks  Types: 40 Cans of beer per week    Comment: 3 big bottles of liquor  . Drug use: Yes    Types: Marijuana, Cocaine    Review of Systems Constitutional: No fever/chills Eyes: No visual changes. ENT: No sore throat. Cardiovascular: Denies chest pain. Respiratory: Denies shortness of breath. Gastrointestinal: No abdominal pain.  No nausea, no vomiting.  No diarrhea.  No constipation. Genitourinary: Negative for dysuria. Musculoskeletal: Negative for back pain. Skin: Negative for rash. Neurological: Negative for headaches, focal  weakness or numbness.   ____________________________________________   PHYSICAL EXAM:  VITAL SIGNS: ED Triage Vitals  Enc Vitals Group     BP 02/16/18 1015 (!) 148/103     Pulse Rate 02/16/18 1015 (!) 122     Resp 02/16/18 1015 19     Temp 02/16/18 1015 98.2 F (36.8 C)     Temp Source 02/16/18 1015 Oral     SpO2 02/16/18 1015 99 %     Weight 02/16/18 1015 187 lb 4 oz (84.9 kg)     Height 02/16/18 1015 '5\' 3"'  (1.6 m)     Head Circumference --      Peak Flow --      Pain Score 02/16/18 1026 10     Pain Loc --      Pain Edu? --      Excl. in [place]? --     Constitutional: Alert and oriented x4 appears uncomfortable screaming in pain and heavy ketones on her breath Eyes: PERRL EOMI. Head: Atraumatic. Nose: No congestion/rhinnorhea. Mouth/Throat: No trismus Neck: No stridor.   Cardiovascular: Tachycardic rate, regular rhythm. Grossly normal heart sounds.  Good peripheral circulation. Respiratory: Increased respiratory effort.  No retractions. Lungs CTAB and moving good air Gastrointestinal: Soft nontender Musculoskeletal: She does have about a 4 cm deep mass to her right hip but is not warm and not particularly tender and is most consistent with lipoma Neurologic:  Normal speech and language. No gross focal neurologic deficits are appreciated. Skin:  Skin is warm, dry and intact. No rash noted. Psychiatric: Appears quite angry   ____________________________________________   DIFFERENTIAL includes but not limited to  DKA, HHS, dehydration, abscess ____________________________________________   LABS (all labs ordered are listed, but only abnormal results are displayed)  Labs Reviewed  URINE CULTURE - Abnormal; Notable for the following components:      Result Value   Culture MULTIPLE SPECIES PRESENT, SUGGEST RECOLLECTION (*)    All other components within normal limits  BASIC METABOLIC PANEL - Abnormal; Notable for the following components:   Sodium 125 (*)     Chloride 89 (*)    CO2 14 (*)    Glucose, Bld 716 (*)    BUN 22 (*)    Creatinine, Ser 1.60 (*)    GFR calc non Af Amer 35 (*)    GFR calc Af Amer 41 (*)    Anion gap 22 (*)    All other components within normal limits  CBC - Abnormal; Notable for the following components:   RBC 5.31 (*)    Hemoglobin 17.2 (*)    HCT 50.3 (*)    All other components within normal limits  URINALYSIS, COMPLETE (UACMP) WITH MICROSCOPIC - Abnormal; Notable for the following components:   Color, Urine YELLOW (*)    APPearance CLOUDY (*)    Glucose, UA >=500 (*)    Hgb urine dipstick MODERATE (*)    Ketones, ur 80 (*)    Leukocytes, UA LARGE (*)  Bacteria, UA RARE (*)    All other components within normal limits  GLUCOSE, CAPILLARY - Abnormal; Notable for the following components:   Glucose-Capillary >600 (*)    All other components within normal limits  BLOOD GAS, VENOUS - Abnormal; Notable for the following components:   pCO2, Ven 30 (*)    Bicarbonate 13.2 (*)    Acid-base deficit 12.5 (*)    All other components within normal limits  BETA-HYDROXYBUTYRIC ACID - Abnormal; Notable for the following components:   Beta-Hydroxybutyric Acid 4.82 (*)    All other components within normal limits  GLUCOSE, CAPILLARY - Abnormal; Notable for the following components:   Glucose-Capillary >600 (*)    All other components within normal limits  GLUCOSE, CAPILLARY - Abnormal; Notable for the following components:   Glucose-Capillary 584 (*)    All other components within normal limits  BASIC METABOLIC PANEL - Abnormal; Notable for the following components:   Sodium 129 (*)    Chloride 96 (*)    CO2 16 (*)    Glucose, Bld 500 (*)    Creatinine, Ser 1.19 (*)    GFR calc non Af Amer 50 (*)    GFR calc Af Amer 58 (*)    Anion gap 17 (*)    All other components within normal limits  BASIC METABOLIC PANEL - Abnormal; Notable for the following components:   Sodium 133 (*)    CO2 21 (*)    Glucose, Bld  222 (*)    All other components within normal limits  GLUCOSE, CAPILLARY - Abnormal; Notable for the following components:   Glucose-Capillary 321 (*)    All other components within normal limits  GLUCOSE, CAPILLARY - Abnormal; Notable for the following components:   Glucose-Capillary 252 (*)    All other components within normal limits  URINE DRUG SCREEN, QUALITATIVE ([facility] ONLY) - Abnormal; Notable for the following components:   Cocaine Metabolite,Ur [place] POSITIVE (*)    Benzodiazepine, Ur Scrn TEST NOT PERFORMED, REAGENT NOT AVAILABLE (*)    All other components within normal limits  GLUCOSE, CAPILLARY - Abnormal; Notable for the following components:   Glucose-Capillary 231 (*)    All other components within normal limits  GLUCOSE, CAPILLARY - Abnormal; Notable for the following components:   Glucose-Capillary 207 (*)    All other components within normal limits  BASIC METABOLIC PANEL - Abnormal; Notable for the following components:   Sodium 132 (*)    CO2 21 (*)    Glucose, Bld 334 (*)    Calcium 8.8 (*)    All other components within normal limits  GLUCOSE, CAPILLARY - Abnormal; Notable for the following components:   Glucose-Capillary 207 (*)    All other components within normal limits  GLUCOSE, CAPILLARY - Abnormal; Notable for the following components:   Glucose-Capillary 312 (*)    All other components within normal limits  HEMOGLOBIN A1C - Abnormal; Notable for the following components:   Hgb A1c MFr Bld 14.7 (*)    All other components within normal limits  GLUCOSE, CAPILLARY - Abnormal; Notable for the following components:   Glucose-Capillary 304 (*)    All other components within normal limits  GLUCOSE, CAPILLARY - Abnormal; Notable for the following components:   Glucose-Capillary 319 (*)    All other components within normal limits  GLUCOSE, CAPILLARY - Abnormal; Notable for the following components:   Glucose-Capillary 302 (*)    All other components within  normal limits  GLUCOSE, CAPILLARY -  Abnormal; Notable for the following components:   Glucose-Capillary 226 (*)    All other components within normal limits  GLUCOSE, CAPILLARY - Abnormal; Notable for the following components:   Glucose-Capillary 385 (*)    All other components within normal limits  BASIC METABOLIC PANEL - Abnormal; Notable for the following components:   Sodium 132 (*)    CO2 21 (*)    Glucose, Bld 349 (*)    Calcium 8.7 (*)    All other components within normal limits  GLUCOSE, CAPILLARY - Abnormal; Notable for the following components:   Glucose-Capillary 234 (*)    All other components within normal limits  MRSA PCR SCREENING  TROPONIN I  TROPONIN I  MAGNESIUM  RAPID HIV SCREEN (HIV 1/2 AB+AG)  CBC WITH DIFFERENTIAL/PLATELET  TROPONIN I  TROPONIN I  TROPONIN I  HEMOGLOBIN A1C    Lab work reviewed by me consistent with diabetic ketoacidosis __________________________________________  EKG  ED ECG REPORT I, Darel Hong, the attending physician, personally viewed and interpreted this ECG.  Date: 02/18/2018 EKG Time:  Rate: 102 Rhythm: Sinus tachycardia QRS Axis: normal Intervals: normal ST/T Wave abnormalities: normal Narrative Interpretation: no evidence of acute ischemia  ____________________________________________  RADIOLOGY   ____________________________________________   PROCEDURES  Procedure(s) performed: yes  Angiocath insertion Performed by: Darel Hong  Consent: Verbal consent obtained. Risks and benefits: risks, benefits and alternatives were discussed Time out: Immediately prior to procedure a "time out" was called to verify the correct patient, procedure, equipment, support staff and site/side marked as required.  Preparation: Patient was prepped and draped in the usual sterile fashion.  Vein Location: right EJ  Gauge: 20/22 twin cath  Normal blood return and flush without difficulty Patient tolerance: Patient  tolerated the procedure well with no immediate complications.     .Critical Care Performed by: Darel Hong, MD Authorized by: Darel Hong, MD   Critical care provider statement:    Critical care time (minutes):  35   Critical care time was exclusive of:  Separately billable procedures and treating other patients   Critical care was necessary to treat or prevent imminent or life-threatening deterioration of the following conditions:  Endocrine crisis and dehydration   Critical care was time spent personally by me on the following activities:  Development of treatment plan with patient or surrogate, discussions with consultants, evaluation of patient's response to treatment, examination of patient, obtaining history from patient or surrogate, ordering and performing treatments and interventions, ordering and review of laboratory studies, ordering and review of radiographic studies, pulse oximetry, re-evaluation of patient's condition and review of old charts    Critical Care performed: yes  ____________________________________________   INITIAL IMPRESSION / ASSESSMENT AND PLAN / ED COURSE  Pertinent labs & imaging results that were available during my care of the patient were reviewed by me and considered in my medical decision making (see chart for details).   As part of my medical decision making, I reviewed the following data within the [facility] History obtained from family if available, nursing notes, old chart and ekg, as well as notes from prior ED visits.  Patient arrives in the emergency department hyperglycemic with elevated anion gap low bicarbonate and ketones which is all consistent with diabetic ketoacidosis.  Begin IV fluid resuscitation and as her potassium is high enough we will start an insulin drip.  Regarding her "abscess" in her right hip I do not believe it is an abscess and is more consistent with a  lipoma.  No warmth and no particular  tenderness.  No fevers or chills.  Her DKA is likely secondary to medication noncompliance as she is eating [employer] and drinking soda as I enter the room.  I discussed with the hospitalist Dr. Jerelyn Charles who has graciously agreed to admit the patient to his service.  Her urinalysis does appear to show Candida and bacterial infections of ceftriaxone and Diflucan now.      ____________________________________________   FINAL CLINICAL IMPRESSION(S) / ED DIAGNOSES  Final diagnoses:  Diabetic ketoacidosis without coma associated with type 2 diabetes mellitus ([place])  Acute cystitis without hematuria  Candida albicans infection      NEW MEDICATIONS STARTED DURING THIS VISIT:  Current Discharge Medication List       Note:  This document was prepared using Dragon voice recognition software and may include unintentional dictation errors.     Darel Hong, MD 02/18/18 [phone]

## 2018-02-16 NOTE — Progress Notes (Signed)
Family Meeting Note  Advance Directive:yes  Today a meeting took place with the Patient.  Patient is able to participate   The following clinical team members were present during this meeting:MD  The following were discussed:Patient's diagnosis: dka, Patient's progosis: Unable to determine and Goals for treatment: Full Code  Additional follow-up to be provided: prn  Time spent during discussion:20 minutes  Pamela Fertig D Jaythen Hamme, MD  

## 2018-02-16 NOTE — Consult Note (Signed)
PHARMACY - CRITICAL CARE PROGRESS NOTE  Pharmacy Consult for K+ Monitoring and Replacement  Indication: DKA   Patient Measurements: Height: 5\' 3"  (160 cm) Weight: 187 lb 13.3 oz (85.2 kg) IBW/kg (Calculated) : 52.4   Labs: Estimated Creatinine Clearance: 73.8 mL/min (by C-G formula based on SCr of 0.88 mg/dL).  Recent Labs    02/16/18 1602 02/16/18 1706 02/16/18 1826  GLUCAP 252* 231* 207*    Lab Results  Component Value Date   K 4.8 02/16/2018    Assessment: Pharmacy consulted for K+ monitoring and replacement for 57 yo female admitted with DKA. Potassium on admission was 4.7. Patient received KCL 80mEq x 1. Magnesium 2g IV and KCL 10mEq IV x 2   8/8 @1400  K+ 5.1 8/8 @1743  K+ 4.8  Goal of Therapy:  K+:  >4 mmol/L  Plan: no replacement potassium needed at this time  BMPs ordered every 4 hours x 3. Will continue to follow labs and replace as needed to maintain K+ >4 while on insulin drip.    Lowella Bandyodney D Beckham Capistran, PharmD Clinical Pharmacist 02/16/2018 7:05 PM

## 2018-02-16 NOTE — Progress Notes (Signed)
Dr. Belia HemanKasa gave verbal order that it's okay for patient to travel off floor to ultrasound without nurse.

## 2018-02-16 NOTE — Progress Notes (Signed)
Patient agitated walking around her room cussing at staff and stating "she needs to get the fuck up out of here."  Patient mad she couldn't find her glasses and Mark, NT found her glasses in cabinet.  Shontel, NT changed patient's bed linens from where she moved and purwick dislodged out of correct position and patient peed on bed pad.  RN called and made Harlon DittyJeremiah Keene, NP aware of patient's agitation and that patient wants to eat.  NP stated he would come to unit.

## 2018-02-16 NOTE — Consult Note (Signed)
PHARMACY - CRITICAL CARE PROGRESS NOTE  Pharmacy Consult for K+ Monitoring and Replacement  Indication: DKA   Patient Measurements: Height: 5\' 3"  (160 cm) Weight: 187 lb 13.3 oz (85.2 kg) IBW/kg (Calculated) : 52.4   Labs: Estimated Creatinine Clearance: 40.6 mL/min (A) (by C-G formula based on SCr of 1.6 mg/dL (H)).  Recent Labs    02/16/18 1030 02/16/18 1245 02/16/18 1323  GLUCAP >600* >600* 584*    Lab Results  Component Value Date   K 5.1 02/16/2018    Assessment: Pharmacy consulted for K+ monitoring and replacement for 57 yo female admitted with DKA. Potassium on admission was 4.7. Patient received KCL 80mEq x 1. Magnesium 2g IV and KCL 10mEq IV x 2 ordered.   Goal of Therapy:  K+:  >4 mmol/L  Plan:  8/8 @1400  K+ 5.1. No additional replacement needed at this time.   BMPs ordered every 4 hours x 3. Will continue to follow labs and replace as needed to maintain K+ >4 while on insulin drip.    Gardner CandleSheema M Danaya Geddis, PharmD, BCPS Clinical Pharmacist 02/16/2018 2:11 PM

## 2018-02-17 ENCOUNTER — Inpatient Hospital Stay: Payer: Medicaid Other

## 2018-02-17 ENCOUNTER — Encounter: Payer: Self-pay | Admitting: Radiology

## 2018-02-17 ENCOUNTER — Other Ambulatory Visit: Payer: Self-pay

## 2018-02-17 LAB — CBC WITH DIFFERENTIAL/PLATELET
BASOS PCT: 1 %
Basophils Absolute: 0 10*3/uL (ref 0–0.1)
EOS ABS: 0.1 10*3/uL (ref 0–0.7)
Eosinophils Relative: 1 %
HCT: 42.7 % (ref 35.0–47.0)
HEMOGLOBIN: 14.8 g/dL (ref 12.0–16.0)
Lymphocytes Relative: 31 %
Lymphs Abs: 1.8 10*3/uL (ref 1.0–3.6)
MCH: 32.2 pg (ref 26.0–34.0)
MCHC: 34.7 g/dL (ref 32.0–36.0)
MCV: 92.7 fL (ref 80.0–100.0)
MONOS PCT: 9 %
Monocytes Absolute: 0.6 10*3/uL (ref 0.2–0.9)
Neutro Abs: 3.4 10*3/uL (ref 1.4–6.5)
Neutrophils Relative %: 58 %
PLATELETS: 217 10*3/uL (ref 150–440)
RBC: 4.6 MIL/uL (ref 3.80–5.20)
RDW: 12.7 % (ref 11.5–14.5)
WBC: 5.9 10*3/uL (ref 3.6–11.0)

## 2018-02-17 LAB — GLUCOSE, CAPILLARY
GLUCOSE-CAPILLARY: 302 mg/dL — AB (ref 70–99)
Glucose-Capillary: 319 mg/dL — ABNORMAL HIGH (ref 70–99)
Glucose-Capillary: 226 mg/dL — ABNORMAL HIGH (ref 70–99)
Glucose-Capillary: 385 mg/dL — ABNORMAL HIGH (ref 70–99)
Glucose-Capillary: 234 mg/dL — ABNORMAL HIGH (ref 70–99)

## 2018-02-17 LAB — BASIC METABOLIC PANEL
ANION GAP: 10 (ref 5–15)
BUN: 18 mg/dL (ref 6–20)
CO2: 21 mmol/L — ABNORMAL LOW (ref 22–32)
Calcium: 8.8 mg/dL — ABNORMAL LOW (ref 8.9–10.3)
Chloride: 101 mmol/L (ref 98–111)
Creatinine, Ser: 0.79 mg/dL (ref 0.44–1.00)
Glucose, Bld: 334 mg/dL — ABNORMAL HIGH (ref 70–99)
Potassium: 4.1 mmol/L (ref 3.5–5.1)
SODIUM: 132 mmol/L — AB (ref 135–145)

## 2018-02-17 LAB — HEMOGLOBIN A1C
HEMOGLOBIN A1C: 14.7 % — AB (ref 4.8–5.6)
Mean Plasma Glucose: 375.19 mg/dL

## 2018-02-17 LAB — TROPONIN I
Troponin I: <0.03 ng/mL (ref <0.03)
Troponin I: <0.03 ng/mL (ref <0.03)
Troponin I: <0.03 ng/mL (ref <0.03)

## 2018-02-17 LAB — URINE CULTURE

## 2018-02-17 MED ORDER — INSULIN ASPART PROT & ASPART (70-30 MIX) 100 UNIT/ML ~~LOC~~ SUSP
28.0000 [IU] | Freq: Two times a day (BID) | SUBCUTANEOUS | Status: DC
  Administered 2018-02-17: 28 [IU] via SUBCUTANEOUS
  Filled 2018-02-17: qty 10

## 2018-02-17 MED ORDER — ALUM & MAG HYDROXIDE-SIMETH 200-200-20 MG/5ML PO SUSP
30.0000 mL | Freq: Four times a day (QID) | ORAL | Status: DC | PRN
  Administered 2018-02-17: 30 mL via ORAL
  Filled 2018-02-17: qty 30

## 2018-02-17 MED ORDER — IOPAMIDOL (ISOVUE-300) INJECTION 61%
30.0000 mL | Freq: Once | INTRAVENOUS | Status: AC | PRN
  Administered 2018-02-17: 30 mL via ORAL

## 2018-02-17 MED ORDER — MORPHINE SULFATE (PF) 2 MG/ML IV SOLN
2.0000 mg | Freq: Once | INTRAVENOUS | Status: AC
  Administered 2018-02-17: 2 mg via INTRAVENOUS
  Filled 2018-02-17: qty 1

## 2018-02-17 MED ORDER — NITROGLYCERIN 0.4 MG SL SUBL
0.4000 mg | SUBLINGUAL_TABLET | SUBLINGUAL | Status: DC | PRN
  Administered 2018-02-17 (×2): 0.4 mg via SUBLINGUAL
  Filled 2018-02-17 (×3): qty 1

## 2018-02-17 MED ORDER — PREMIER PROTEIN SHAKE
11.0000 [oz_av] | Freq: Two times a day (BID) | ORAL | Status: DC
  Administered 2018-02-17: 11 [oz_av] via ORAL

## 2018-02-17 MED ORDER — IOHEXOL 300 MG/ML  SOLN
100.0000 mL | Freq: Once | INTRAMUSCULAR | Status: AC | PRN
  Administered 2018-02-17: 100 mL via INTRAVENOUS

## 2018-02-17 MED ORDER — INSULIN ASPART PROT & ASPART (70-30 MIX) 100 UNIT/ML ~~LOC~~ SUSP
5.0000 [IU] | SUBCUTANEOUS | Status: AC
  Administered 2018-02-17: 5 [IU] via SUBCUTANEOUS
  Filled 2018-02-17: qty 10

## 2018-02-17 MED ORDER — FAMOTIDINE IN NACL 20-0.9 MG/50ML-% IV SOLN
20.0000 mg | Freq: Two times a day (BID) | INTRAVENOUS | Status: DC
  Administered 2018-02-17 (×2): 20 mg via INTRAVENOUS
  Filled 2018-02-17 (×2): qty 50

## 2018-02-17 MED ORDER — OCUVITE-LUTEIN PO CAPS
1.0000 | ORAL_CAPSULE | Freq: Every day | ORAL | Status: DC
  Administered 2018-02-17: 1 via ORAL
  Filled 2018-02-17 (×2): qty 1

## 2018-02-17 MED ORDER — ALPRAZOLAM 0.5 MG PO TABS
0.5000 mg | ORAL_TABLET | Freq: Two times a day (BID) | ORAL | Status: DC | PRN
  Administered 2018-02-17: 0.5 mg via ORAL
  Filled 2018-02-17: qty 1

## 2018-02-17 NOTE — Progress Notes (Signed)
Patient ID: Pamela Lane, female   DOB: [birth year], 57 y.o.   MRN: [id number]  [employer] [facility] PROGRESS NOTE  Pamela Lane:[id number] DOB: 03/09/1961 DOA: 02/16/2018 PCP: [facility]  HPI/Subjective: Patient was sent out of the ICU today.  Patient's complained of some abdominal pain when I saw her.  Then complained of chest pain after I saw her.  She is complaining of her hip having a knot on it for 1 year.  Objective: Vitals:   02/17/18 1011 02/17/18 1416  BP: (!) 142/85 109/64  Pulse: 100 (!) 107  Resp:  (!) 22  Temp: 98.2 F (36.8 C)   SpO2: 100% 98%    Filed Weights   02/16/18 1015 02/16/18 1322  Weight: 84.9 kg 85.2 kg    ROS: Review of Systems  Constitutional: Negative for chills and fever.  Eyes: Negative for blurred vision.  Respiratory: Negative for cough and shortness of breath.   Cardiovascular: Positive for chest pain.  Gastrointestinal: Positive for abdominal pain and nausea. Negative for constipation, diarrhea and vomiting.  Genitourinary: Negative for dysuria.  Musculoskeletal: Negative for joint pain.  Neurological: Negative for dizziness and headaches.   Exam: Physical Exam  HENT:  Nose: No mucosal edema.  Mouth/Throat: No oropharyngeal exudate or posterior oropharyngeal edema.  Eyes: Pupils are equal, round, and reactive to light. Conjunctivae, EOM and lids are normal.  Neck: No JVD present. Carotid bruit is not present. No edema present. No thyroid mass and no thyromegaly present.  Cardiovascular: S1 normal and S2 normal. Exam reveals no gallop.  No murmur heard. Pulses:      Dorsalis pedis pulses are 2+ on the right side, and 2+ on the left side.  Respiratory: No respiratory distress. She has no wheezes. She has no rhonchi. She has no rales.  GI: Soft. Bowel sounds are normal. There is no tenderness.  Musculoskeletal:       Right ankle: She exhibits no swelling.       Left ankle: She exhibits no swelling.   Right hip mobile area that almost feels like a lipoma  Lymphadenopathy:    She has no cervical adenopathy.  Neurological: She is alert. No cranial nerve deficit.  Skin: Skin is warm. No rash noted. Nails show no clubbing.  Psychiatric: She has a normal mood and affect.      Data Reviewed: Basic Metabolic Panel: Recent Labs  Lab 02/16/18 1017 02/16/18 1350 02/16/18 1743 02/17/18 0440  NA 125* 129* 133* 132*  K 4.7 5.1 4.8 4.1  CL 89* 96* 104 101  CO2 14* 16* 21* 21*  GLUCOSE 716* 500* 222* 334*  BUN 22* 19 20 18   CREATININE 1.60* 1.19* 0.88 0.79  CALCIUM 9.3 9.1 9.3 8.8*  MG  --  2.2  --   --    CBC: Recent Labs  Lab 02/16/18 1017 02/17/18 0440  WBC 10.1 5.9  NEUTROABS  --  3.4  HGB 17.2* 14.8  HCT 50.3* 42.7  MCV 94.7 92.7  PLT 293 217   Cardiac Enzymes: Recent Labs  Lab 02/16/18 1350 02/16/18 1925 02/17/18 1433  TROPONINI <0.03 <0.03 <0.03    CBG: Recent Labs  Lab 02/16/18 2139 02/16/18 2355 02/17/18 0154 02/17/18 0726 02/17/18 1147  GLUCAP 312* 304* 319* 302* 226*    Recent Results (from the past 240 hour(s))  Urine culture     Status: Abnormal   Collection Time: 02/16/18 12:00 PM  Result Value Ref Range Status   Specimen Description  Final    URINE, RANDOM Performed at [facility], [address] [place], [place] [address]    Special Requests   Final    NONE Performed at [facility], [address] [place], [place] [address]    Culture MULTIPLE SPECIES PRESENT, SUGGEST RECOLLECTION (A)  Final   Report Status 02/17/2018 FINAL  Final  MRSA PCR Screening     Status: None   Collection Time: 02/16/18  1:34 PM  Result Value Ref Range Status   MRSA by PCR NEGATIVE NEGATIVE Final    Comment:        The GeneXpert MRSA Assay (FDA approved for NASAL specimens only), is one component of a comprehensive MRSA colonization surveillance program. It is not intended to diagnose MRSA infection nor to guide or monitor  treatment for MRSA infections. Performed at [facility], [address], [place], [place] [address]      Studies: Dg Chest 2 View  Result Date: 02/16/2018 CLINICAL DATA:  Elevated blood sugar, fatigue. Current smoker. History of asthma. EXAM: CHEST - 2 VIEW COMPARISON:  Portable chest x-ray of October 15, 2017 FINDINGS: The lungs are adequately inflated. The interstitial markings are mildly prominent. There is no alveolar infiltrate or pleural effusion. The heart and pulmonary vascularity are normal. The trachea is midline. The bony thorax exhibits no acute abnormality. IMPRESSION: Chronic bronchitic-reactive airway changes, stable. No pneumonia, CHF, nor other acute cardiopulmonary abnormality. Electronically Signed   By: David  [place] M.D.   On: 02/16/2018 12:20   [place] Rt Lower Extrem Ltd Soft Tissue Non Vascular  Result Date: 02/16/2018 CLINICAL DATA:  57 y/o F; right hip mass for 2-3 months. Patient reports no recent fall or injury. EXAM: ULTRASOUND RIGHT LOWER EXTREMITY LIMITED TECHNIQUE: Ultrasound examination of the lower extremity soft tissues was performed in the area of clinical concern. COMPARISON:  None. FINDINGS: Within the area of interest in the right hip there is a lobulated well-circumscribed mass measuring approximately 2.2 x 3.4 x 5.5 cm (AP x ML x CC). The mass demonstrates heterogeneous echotexture and internal septations. There is no blood flow within the mass on color doppler identified. There is mildly increased enhanced through transmission. The mass appears to be centered within the underlying musculature. The mass is painful to touch, nonmobile, and margin per ultrasound tech. IMPRESSION: Well-circumscribed mass which appears centered within the musculature of the right hip. Features suggest a complex cystic lesion, likely hematoma. Follow-up to resolution is recommended with CT or ultrasound. Electronically Signed   By: Mitzi Hansen M.D.   On: 02/16/2018 16:20     Scheduled Meds: . aspirin EC  81 mg Oral Daily  . buPROPion  150 mg Oral BID  . enoxaparin (LOVENOX) injection  40 mg Subcutaneous Q24H  . insulin aspart  0-15 Units Subcutaneous TID WC  . insulin aspart  0-5 Units Subcutaneous QHS  . insulin aspart protamine- aspart  28 Units Subcutaneous BID WC  . lisinopril  10 mg Oral Daily  . multivitamin-lutein  1 capsule Oral Daily  . protein supplement shake  11 oz Oral BID BM   Continuous Infusions: . cefTRIAXone (ROCEPHIN)  IV Stopped (02/17/18 1237)  . famotidine (PEPCID) IV Stopped (02/17/18 1501)    Assessment/Plan:  1. Diabetic ketoacidosis from noncompliance with medication.  Family is supposed to bring in her insulin and see if it is expired or not.  Likely will need a new prescription of insulin.  Diabetes coordinator recommended 30 units of 70/30 insulin  twice a day.  Patient believes that the nighttime insulin may kill her.  Hopefully she will take 28 units twice a day. 2. Abdominal pain.  We will get a CT scan of the abdomen and pelvis.  This will also evaluate the right hip.  3. Chest pain.  EKG reviewed.  First troponin negative.  Nitroglycerin as needed.  Patient already on an aspirin.  Anxiety could be playing a role.  PRN anxiety medication.  I also ordered Pepcid IV since the patient was complaining of abdominal pain prior. 4. Essential hypertension on lisinopril 5. Cocaine on positive on the last 3 urine toxicology is that I see.  This is not helping things. 6. Acute cystitis with hematuria.  On Rocephin.  Urine culture contamination 7. History of psychiatric issues on Wellbutrin  Code Status:     Code Status Orders  (From admission, onward)         Start     Ordered   02/16/18 1331  Full code  Continuous     02/16/18 1330        Code Status History    Date Active Date Inactive Code Status Order ID Comments User Context   11/26/2017 1421 11/27/2017 1812 Full Code [id number]  Auburn BilberryPatel, Shreyang, MD Inpatient    10/16/2017 0212 10/17/2017 1427 Full Code [id number]  Enedina FinnerPatel, Sona, MD Inpatient   09/23/2017 2252 09/24/2017 1505 Full Code [id number]  Cammy CopaMaier, Angela, MD Inpatient   05/15/2016 1745 05/16/2016 1749 Full Code [id number]  Auburn BilberryPatel, Shreyang, MD Inpatient   01/13/2016 0051 01/14/2016 1314 Full Code [id number]  Clydie BraunSmith, Rondell A, MD ED   01/08/2016 1916 01/09/2016 1704 Full Code [id number]  Katharina CaperVaickute, Rima, MD Inpatient     Family Communication: Patient made a phone call while is in the room and gave me the phone to speak with the person on the phone with her Disposition Plan: Hopefully home tomorrow if feeling better.  Antibiotics:  Rocephin  Time spent: 28 minutes  Drake Wuertz [employer]  [employer]

## 2018-02-17 NOTE — Consult Note (Signed)
PHARMACY - CRITICAL CARE PROGRESS NOTE  Pharmacy Consult for K+ Monitoring and Replacement  Indication: DKA   Patient Measurements: Height: 5\' 3"  (160 cm) Weight: 187 lb 13.3 oz (85.2 kg) IBW/kg (Calculated) : 52.4   Labs: Estimated Creatinine Clearance: 81.2 mL/min (by C-G formula based on SCr of 0.79 mg/dL).  Recent Labs    02/16/18 2355 02/17/18 0154 02/17/18 0726  GLUCAP 304* 319* 302*    Lab Results  Component Value Date   K 4.1 02/17/2018    Assessment: Pharmacy consulted for K+ monitoring and replacement for 57 yo female admitted with DKA. Potassium on admission was 4.7. Patient received KCL 80mEq x 1. Magnesium 2g IV and KCL 10mEq IV x 2   8/8 @1400  K+ 5.1 8/8 @1743  K+ 4.8  Goal of Therapy:  K+:  >4 mmol/L  Plan:  K:4.1 this morning. Patient has been transitioned off drip and is now receiving Novolog 70/30 BID. No electrolyte replacement needed at this time.   Pharmacy will sign off.   Gardner CandleSheema M Tabita Corbo, PharmD, BCPS Clinical Pharmacist 02/17/2018 8:39 AM

## 2018-02-17 NOTE — Progress Notes (Signed)
02/17/2018 12:38 PM  Pt asked to walk in the hall.  I accompanied pt and she asked to go outside.  I walked outside with the patient where she said she wanted to smoke.  Explained this was a tobacco free campus.  Pt became agitated, yelling, cursing but agreed to return to room.  Pt refused to surrender cigarettes and lighter on her person at this time.  Offered nicotine patch and pt refused.  Will continue to monitor pt.  Bradly Chrisougherty, Dayle Sherpa E, RN

## 2018-02-17 NOTE — Progress Notes (Addendum)
Inpatient Diabetes Program Recommendations  AACE/ADA: New Consensus Statement on Inpatient Glycemic Control (2015)  Target Ranges:  Prepandial:   less than 140 mg/dL      Peak postprandial:   less than 180 mg/dL (1-2 hours)      Critically ill patients:  140 - 180 mg/dL   Results for Pamela Lane, Pamela Lane (MRN [id number]) as of 02/17/2018 07:56  Ref. Range 02/16/2018 10:30 02/16/2018 12:45 02/16/2018 13:23 02/16/2018 14:40 02/16/2018 16:02 02/16/2018 17:06 02/16/2018 18:26 02/16/2018 20:28 02/16/2018 21:39 02/16/2018 23:55  Glucose-Capillary Latest Ref Range: 70 - 99 mg/dL >782>600 (HH) >956>600 (HH)  IV Insulin Drip 584 (HH)  IV Insulin Drip 321 (H)  IV Insulin Drip 252 (H)  IV Insulin Drip 231 (H)  IV Insulin Drip 207 (H)  IV Insulin Drip 207 (H)  22 units 70/30 Insulin 312 (H)  4 units NOVOLOG  304 (H)  5 units NOVOLOG     Results for Pamela Lane, Pamela Lane (MRN [id number]) as of 02/17/2018 07:56  Ref. Range 02/17/2018 01:54 02/17/2018 07:26  Glucose-Capillary Latest Ref Range: 70 - 99 mg/dL 469319 (H) 629302 (H)    Admit with: DKA  History: DM  Home DM Meds: 70/30 Insulin- 22 units BID       Metformin 500 mg BID  Current Orders: 70/30 Insulin- 24 units BID      Novolog Moderate Correction Scale/ SSI (0-15 units) TID AC + HS     Counseled by the Diabetes Coordinator during previous admission on 10/17/2017.  This is patient's 4th admission so far this year.  Has Medicaid coverage [facility]  Note from nursing notes from last evening that patient was agitated and was cursing at staff.     MD- Please consider the following in-hospital insulin adjustments:  Increase 70/30 Insulin to 30 units BID with meals  This would give patient about 42 units total basal insulin (split between the 2 doses) and 9 units rapid-acting insulin with both breakfast and dinner.      --Will follow patient during hospitalization--  Ambrose FinlandJeannine Johnston Abreanna Drawdy RN, MSN,  CDE Diabetes Coordinator Inpatient Glycemic Control Team Team Pager: [phone] (8a-5p)

## 2018-02-17 NOTE — Progress Notes (Signed)
Initial Nutrition Assessment  DOCUMENTATION CODES:   Obesity unspecified  INTERVENTION:   Premier Protein BID, each supplement provides 160 kcal and 30 grams of protein.   Ocuvite daily for wound healing (provides zinc, vitamin A, vitamin C, Vitamin E, copper, and selenium)  RD will attempt diabetes education at follow up  NUTRITION DIAGNOSIS:   Increased nutrient needs related to wound healing as evidenced by increased estimated needs.  GOAL:   Patient will meet greater than or equal to 90% of their needs  MONITOR:   PO intake, Supplement acceptance, Labs, Weight trends, Skin, I & O's  REASON FOR ASSESSMENT:   Malnutrition Screening Tool    ASSESSMENT:   57 y/o female with h/o DM admitted with DKA, AKI and hip abscess    Unable to see pt today x 2 visits. Per chart, pt eating well in the ED; pt eating chic fila and gingerale. Apparently, pt has been non compliant with medications and diabetic diet. Per chart, pt has lost 38lbs(17%) over the past 5 months; this is severe weight loss. RD is unsure if weight loss was intentional or secondary to her uncontrolled diabetes. Pt on carbohydrate controlled diet; RD will add Premier Protein to encourage wound healing and prevent loss of lean muscle. RD will obtain nutrition related history and exam at follow up. RD will also attempt diabetes education at follow up.   Medications reviewed and include: aspirin, lovenox, insulin, ceftriaxone   Labs reviewed: Na 132(L) cbgs- 319, 302, 226 x 24 hrs AIC 14.7(H)- 8/8  Unable to complete Nutrition-Focused physical exam at this time.   Diet Order:   Diet Order            Diet Carb Modified Fluid consistency: Thin; Room service appropriate? Yes  Diet effective now             EDUCATION NEEDS:   Not appropriate for education at this time  Skin:  Skin Assessment: Reviewed RN Assessment  Last BM:  8/8  Height:   Ht Readings from Last 1 Encounters:  02/16/18 5\' 3"  (1.6 m)     Weight:   Wt Readings from Last 1 Encounters:  02/16/18 85.2 kg    Ideal Body Weight:  52.3 kg  BMI:  Body mass index is 33.27 kg/m.  Estimated Nutritional Needs:   Kcal:  1600-1800kcal/day   Protein:  85-94g/day   Fluid:  >1.6L/day   Pamela Holidayasey Ailen Strauch MS, RD, LDN Pager #- [phone] Office#- [phone] After Hours Pager: [phone]

## 2018-02-18 LAB — BASIC METABOLIC PANEL
Anion gap: 11 (ref 5–15)
BUN: 14 mg/dL (ref 6–20)
CO2: 21 mmol/L — AB (ref 22–32)
Calcium: 8.7 mg/dL — ABNORMAL LOW (ref 8.9–10.3)
Chloride: 100 mmol/L (ref 98–111)
Creatinine, Ser: 0.84 mg/dL (ref 0.44–1.00)
GFR calc Af Amer: >60 mL/min (ref >60)
GLUCOSE: 349 mg/dL — AB (ref 70–99)
Potassium: 3.9 mmol/L (ref 3.5–5.1)
Sodium: 132 mmol/L — ABNORMAL LOW (ref 135–145)

## 2018-02-18 LAB — GLUCOSE, RANDOM: GLUCOSE: 460 mg/dL — AB (ref 70–99)

## 2018-02-18 LAB — HEMOGLOBIN A1C
HEMOGLOBIN A1C: 14.6 % — AB (ref 4.8–5.6)
MEAN PLASMA GLUCOSE: 372.32 mg/dL

## 2018-02-18 LAB — GLUCOSE, CAPILLARY: Glucose-Capillary: 413 mg/dL — ABNORMAL HIGH (ref 70–99)

## 2018-02-18 MED ORDER — INSULIN ASPART 100 UNIT/ML ~~LOC~~ SOLN
20.0000 [IU] | Freq: Once | SUBCUTANEOUS | Status: AC
  Administered 2018-02-18: 20 [IU] via SUBCUTANEOUS
  Filled 2018-02-18: qty 1

## 2018-02-18 MED ORDER — LISINOPRIL 10 MG PO TABS: 10.0000 mg | ORAL_TABLET | Freq: Every day | ORAL | 0 refills | Status: DC

## 2018-02-18 MED ORDER — INSULIN ASPART PROT & ASPART (70-30 MIX) 100 UNIT/ML ~~LOC~~ SUSP: 32.0000 [IU] | Freq: Two times a day (BID) | SUBCUTANEOUS | 0 refills | Status: DC

## 2018-02-18 MED ORDER — ALBUTEROL SULFATE HFA 108 (90 BASE) MCG/ACT IN AERS: 2.0000 | INHALATION_SPRAY | Freq: Four times a day (QID) | RESPIRATORY_TRACT | 2 refills | Status: DC | PRN

## 2018-02-18 MED ORDER — INSULIN ASPART PROT & ASPART (70-30 MIX) 100 UNIT/ML ~~LOC~~ SUSP: 32.0000 [IU] | Freq: Two times a day (BID) | SUBCUTANEOUS | Status: DC

## 2018-02-18 MED ORDER — BLOOD GLUCOSE MONITOR KIT: PACK | 0 refills | Status: DC

## 2018-02-18 MED ORDER — METFORMIN HCL 500 MG PO TABS: 500.0000 mg | ORAL_TABLET | Freq: Two times a day (BID) | ORAL | 1 refills | Status: DC

## 2018-02-18 MED ORDER — INSULIN ASPART PROT & ASPART (70-30 MIX) 100 UNIT/ML ~~LOC~~ SUSP
32.0000 [IU] | Freq: Two times a day (BID) | SUBCUTANEOUS | Status: DC
  Administered 2018-02-18: 32 [IU] via SUBCUTANEOUS
  Filled 2018-02-18: qty 10

## 2018-02-18 MED ORDER — CEFUROXIME AXETIL 500 MG PO TABS: 500.0000 mg | ORAL_TABLET | Freq: Two times a day (BID) | ORAL | 0 refills | Status: AC

## 2018-02-18 MED ORDER — BUPROPION HCL ER (SR) 150 MG PO TB12: 150.0000 mg | ORAL_TABLET | Freq: Two times a day (BID) | ORAL | 0 refills | Status: DC

## 2018-02-18 NOTE — Progress Notes (Signed)
Pt discharged per MD order. IVs removed. Discharge instructions reviewed with pt. Prescriptions given to pt. Pt verbalized understanding of instructions with all questions answered to pt satisfaction. Pt declined wheelchair and walked downstairs.

## 2018-02-18 NOTE — Discharge Summary (Signed)
[facility] - [place] at [facility], [place] y.o., DOB 1961-01-26, MRN [id number]. Admission date: 02/16/2018 Discharge Date 02/18/2018 Primary [facility], [facility] Admitting Physician Gorden Harms, MD  Admission Diagnosis  Acute cystitis without hematuria [N30.00] Candida albicans infection [B37.9] Diabetic ketoacidosis without coma associated with type 2 diabetes mellitus ([place]) [E11.10]  Discharge Diagnosis   Active Problems:   DKA (diabetic ketoacidoses) ([place]) from noncompliance with medication Right hip likely benign mass needs follow-up as needed per primary care provider Chest pain musculoskeletal in nature Essential hypertension Acute cystitis with hematuria Medication noncompliance Cocaine use   Hospital Course  Patient is a 57 year old with diabetes who is had multiple admissions presented with hyperglycemia.  Patient was evaluated in the ED and was noted to be in DKA.  She was started on IV fluids and IV insulin.  Patient's anion gap closed.  Subsequently switched to subcu insulin.  Patient also complained of abdominal pain and a lesion in her hip.  Therefore had a CT scan which showed what appeared to be a benign mass.  Per radiology if this starts increasing in size recommend MRI this is her primary care provider will have to do this if needed.  Patient strongly recommended to be compliant with her medications I have given her prescription for all her medications she states that she can buy these these are $3 because she has Medicaid.          Consults  None  Significant Tests:  See full reports for all details     Dg Chest 2 View  Result Date: 02/16/2018 CLINICAL DATA:  Elevated blood sugar, fatigue. Current smoker. History of asthma. EXAM: CHEST - 2 VIEW COMPARISON:  Portable chest x-ray of October 15, 2017 FINDINGS: The lungs are adequately inflated. The interstitial markings are mildly prominent. There is no  alveolar infiltrate or pleural effusion. The heart and pulmonary vascularity are normal. The trachea is midline. The bony thorax exhibits no acute abnormality. IMPRESSION: Chronic bronchitic-reactive airway changes, stable. No pneumonia, CHF, nor other acute cardiopulmonary abnormality. Electronically Signed   By: David  Martinique M.D.   On: 02/16/2018 12:20   Ct Abdomen Pelvis W Contrast  Result Date: 02/17/2018 CLINICAL DATA:  Patient's complained of generalized abdominal pain when and also she is complaining of her RT hip having a knot on it for 1 year. EXAM: CT ABDOMEN AND PELVIS WITH CONTRAST TECHNIQUE: Multidetector CT imaging of the abdomen and pelvis was performed using the standard protocol following bolus administration of intravenous contrast. CONTRAST:  187m OMNIPAQUE IOHEXOL 300 MG/ML  SOLN COMPARISON:  05/11/2016 FINDINGS: Lower chest: Clear lung bases.  Heart normal in size. Hepatobiliary: No focal liver abnormality is seen. No gallstones, gallbladder wall thickening, or biliary dilatation. Pancreas: Unremarkable. No pancreatic ductal dilatation or surrounding inflammatory changes. Spleen: Normal in size without focal abnormality. Adrenals/Urinary Tract: Adrenal glands are unremarkable. Kidneys are normal, without renal calculi, focal lesion, or hydronephrosis. Bladder is unremarkable. Stomach/Bowel: Stomach is within normal limits. Appendix appears normal. No evidence of bowel wall thickening, distention, or inflammatory changes. Vascular/Lymphatic: Mild aortic atherosclerosis. No aneurysm. No pathologically enlarged lymph nodes. Reproductive: Status post hysterectomy. No adnexal masses. Other: There is a soft tissue mass in the subcutaneous fat of the right lower posterior back, the level of the iliac crest, measuring 11.7 x 7.6 x 6.8 cm. This mass is mostly of fat attenuation. It has a thin. There is reticular and hazy soft tissue attenuation within  this as well as 2 areas of ossification. This  lesion was present but incompletely visualized on the prior CT. It is unclear whether it has changed in size or characterization. No abdominal hernia.  No ascites. Musculoskeletal: No fracture or acute finding. No osteoblastic or osteolytic lesions. IMPRESSION: 1. The knot on the right hip is a primarily fat containing mass measuring 11.7 cm in greatest dimension. It is complicated by internal areas of soft tissue attenuation as well as 2 areas ossification. This has a benign/nonaggressive appearance, but a well-differentiated liposarcoma is not excluded. If the patient has pain associated with this or there has been any increase in size, follow-up MRI with and without contrast would be recommended. 2. No acute findings within the abdomen or pelvis. No findings to account for generalized abdominal pain. 3. Mild aortic atherosclerosis. Electronically Signed   By: Lajean Manes M.D.   On: 02/17/2018 18:44   [place] Rt Lower Extrem Ltd Soft Tissue Non Vascular  Result Date: 02/16/2018 CLINICAL DATA:  57 y/o F; right hip mass for 2-3 months. Patient reports no recent fall or injury. EXAM: ULTRASOUND RIGHT LOWER EXTREMITY LIMITED TECHNIQUE: Ultrasound examination of the lower extremity soft tissues was performed in the area of clinical concern. COMPARISON:  None. FINDINGS: Within the area of interest in the right hip there is a lobulated well-circumscribed mass measuring approximately 2.2 x 3.4 x 5.5 cm (AP x ML x CC). The mass demonstrates heterogeneous echotexture and internal septations. There is no blood flow within the mass on color doppler identified. There is mildly increased enhanced through transmission. The mass appears to be centered within the underlying musculature. The mass is painful to touch, nonmobile, and margin per ultrasound tech. IMPRESSION: Well-circumscribed mass which appears centered within the musculature of the right hip. Features suggest a complex cystic lesion, likely hematoma. Follow-up to  resolution is recommended with CT or ultrasound. Electronically Signed   By: Kristine Garbe M.D.   On: 02/16/2018 16:20       Today   Subjective:   [facility] [place] feeling much better wants to go home Objective:   Blood pressure 99/71, pulse (!) 105, temperature 97.6 F (36.4 C), temperature source Oral, resp. rate 18, height '5\' 3"'  (1.6 m), weight 85.2 kg, SpO2 100 %.  .  Intake/Output Summary (Last 24 hours) at 02/18/2018 1457 Last data filed at 02/18/2018 0601 Gross per 24 hour  Intake 690 ml  Output 800 ml  Net -110 ml    Exam VITAL SIGNS: Blood pressure 99/71, pulse (!) 105, temperature 97.6 F (36.4 C), temperature source Oral, resp. rate 18, height '5\' 3"'  (1.6 m), weight 85.2 kg, SpO2 100 %.  GENERAL:  57 y.o.-year-old patient lying in the bed with no acute distress.  EYES: Pupils equal, round, reactive to light and accommodation. No scleral icterus. Extraocular muscles intact.  HEENT: Head atraumatic, normocephalic. Oropharynx and nasopharynx clear.  NECK:  Supple, no jugular venous distention. No thyroid enlargement, no tenderness.  LUNGS: Normal breath sounds bilaterally, no wheezing, rales,rhonchi or crepitation. No use of accessory muscles of respiration.  CARDIOVASCULAR: S1, S2 normal. No murmurs, rubs, or gallops.  ABDOMEN: Soft, nontender, nondistended. Bowel sounds present. No organomegaly or mass.  EXTREMITIES: No pedal edema, cyanosis, or clubbing.  NEUROLOGIC: Cranial nerves II through XII are intact. Muscle strength 5/5 in all extremities. Sensation intact. Gait not checked.  PSYCHIATRIC: The patient is alert and oriented x 3.  SKIN: No obvious rash, lesion, or ulcer.   Data Review  CBC w Diff:  Lab Results  Component Value Date   WBC 5.9 02/17/2018   HGB 14.8 02/17/2018   HCT 42.7 02/17/2018   PLT 217 02/17/2018   LYMPHOPCT 31 02/17/2018   MONOPCT 9 02/17/2018   EOSPCT 1 02/17/2018   BASOPCT 1 02/17/2018   CMP:  Lab Results   Component Value Date   NA 132 (L) 02/18/2018   K 3.9 02/18/2018   CL 100 02/18/2018   CO2 21 (L) 02/18/2018   BUN 14 02/18/2018   CREATININE 0.84 02/18/2018   PROT 8.5 (H) 11/26/2017   ALBUMIN 4.7 11/26/2017   BILITOT 1.5 (H) 11/26/2017   ALKPHOS 95 11/26/2017   AST 19 11/26/2017   ALT 23 11/26/2017  .  Micro Results Recent Results (from the past 240 hour(s))  Urine culture     Status: Abnormal   Collection Time: 02/16/18 12:00 PM  Result Value Ref Range Status   Specimen Description   Final    URINE, RANDOM Performed at [facility], [address], [place], [place] [address]    Special Requests   Final    NONE Performed at [facility], [address] [place], [place] [address]    Culture MULTIPLE SPECIES PRESENT, SUGGEST RECOLLECTION (A)  Final   Report Status 02/17/2018 FINAL  Final  MRSA PCR Screening     Status: None   Collection Time: 02/16/18  1:34 PM  Result Value Ref Range Status   MRSA by PCR NEGATIVE NEGATIVE Final    Comment:        The GeneXpert MRSA Assay (FDA approved for NASAL specimens only), is one component of a comprehensive MRSA colonization surveillance program. It is not intended to diagnose MRSA infection nor to guide or monitor treatment for MRSA infections. Performed at [facility], [address] [place], [place] [address]      Code Status History    Date Active Date Inactive Code Status Order ID Comments User Context   02/16/2018 1330 02/18/2018 1412 Full Code [id number]  Gorden Harms, MD Inpatient   11/26/2017 1421 11/27/2017 1812 Full Code [id number]  Dustin Flock, MD Inpatient   10/16/2017 0212 10/17/2017 1427 Full Code [id number]  Fritzi Mandes, MD Inpatient   09/23/2017 2252 09/24/2017 1505 Full Code [id number]  Amelia Jo, MD Inpatient   05/15/2016 1745 05/16/2016 1749 Full Code [id number]  Dustin Flock, MD Inpatient   01/13/2016 0051 01/14/2016 1314 Full Code [id number]  Norval Morton, MD ED    01/08/2016 1916 01/09/2016 1704 Full Code [id number]  Theodoro Grist, MD Inpatient          Follow-up [place], [place] Follow up on 02/28/2018.   Specialty:  General Practice Contact information: [place] [address] [phone]           Discharge Medications   Allergies as of 02/18/2018   No Known Allergies     Medication List    STOP taking these medications   insulin NPH-regular Human (70-30) 100 UNIT/ML injection Commonly known as:  NOVOLIN 70/30 Replaced by:  insulin aspart protamine- aspart (70-30) 100 UNIT/ML injection     TAKE these medications   albuterol 108 (90 Base) MCG/ACT inhaler Commonly known as:  PROVENTIL HFA;VENTOLIN HFA Inhale 2 puffs into the lungs every 6 (six) hours as needed for wheezing or shortness of breath.   aspirin EC 81 MG tablet Take 81 mg by mouth daily.   blood  glucose meter kit and supplies Kit Dispense based on patient and insurance preference. Use up to four times daily as directed. (FOR ICD-9 250.00, 250.01).   buPROPion 150 MG 12 hr tablet Commonly known as:  WELLBUTRIN SR Take 1 tablet (150 mg total) by mouth 2 (two) times daily.   cefUROXime 500 MG tablet Commonly known as:  CEFTIN Take 1 tablet (500 mg total) by mouth 2 (two) times daily for 3 days.   insulin aspart protamine- aspart (70-30) 100 UNIT/ML injection Commonly known as:  NOVOLOG MIX 70/30 Inject 0.32 mLs (32 Units total) into the skin 2 (two) times daily with a meal. Replaces:  insulin NPH-regular Human (70-30) 100 UNIT/ML injection   lisinopril 10 MG tablet Commonly known as:  PRINIVIL,ZESTRIL Take 1 tablet (10 mg total) by mouth daily.   metFORMIN 500 MG tablet Commonly known as:  GLUCOPHAGE Take 1 tablet (500 mg total) by mouth 2 (two) times daily with a meal.          Total Time in preparing paper work, data evaluation and todays exam - 84 minutes  Dustin Flock M.D on  02/18/2018 at [place]  [phone]

## 2018-02-28 ENCOUNTER — Other Ambulatory Visit: Payer: Self-pay | Admitting: Family Medicine

## 2018-02-28 DIAGNOSIS — R1903 Right lower quadrant abdominal swelling, mass and lump: Secondary | ICD-10-CM

## 2018-03-04 ENCOUNTER — Inpatient Hospital Stay
Admission: EM | Admit: 2018-03-04 | Discharge: 2018-03-07 | DRG: 440 | Disposition: A | Discharge status: Home / Self Care | Payer: Medicaid Other | Attending: Internal Medicine | Admitting: Internal Medicine

## 2018-03-04 ENCOUNTER — Emergency Department: Payer: Medicaid Other

## 2018-03-04 ENCOUNTER — Other Ambulatory Visit: Payer: Self-pay

## 2018-03-04 ENCOUNTER — Encounter: Payer: Self-pay | Admitting: Emergency Medicine

## 2018-03-04 DIAGNOSIS — E876 Hypokalemia: Secondary | ICD-10-CM | POA: Diagnosis present

## 2018-03-04 DIAGNOSIS — I10 Essential (primary) hypertension: Secondary | ICD-10-CM | POA: Diagnosis present

## 2018-03-04 DIAGNOSIS — E119 Type 2 diabetes mellitus without complications: Secondary | ICD-10-CM | POA: Diagnosis present

## 2018-03-04 DIAGNOSIS — J45909 Unspecified asthma, uncomplicated: Secondary | ICD-10-CM | POA: Diagnosis present

## 2018-03-04 DIAGNOSIS — F1010 Alcohol abuse, uncomplicated: Secondary | ICD-10-CM | POA: Diagnosis present

## 2018-03-04 DIAGNOSIS — K852 Alcohol induced acute pancreatitis without necrosis or infection: Principal | ICD-10-CM | POA: Diagnosis present

## 2018-03-04 DIAGNOSIS — Z794 Long term (current) use of insulin: Secondary | ICD-10-CM | POA: Diagnosis not present

## 2018-03-04 DIAGNOSIS — R945 Abnormal results of liver function studies: Secondary | ICD-10-CM | POA: Diagnosis present

## 2018-03-04 DIAGNOSIS — Z7982 Long term (current) use of aspirin: Secondary | ICD-10-CM | POA: Diagnosis not present

## 2018-03-04 DIAGNOSIS — R52 Pain, unspecified: Secondary | ICD-10-CM

## 2018-03-04 DIAGNOSIS — F17210 Nicotine dependence, cigarettes, uncomplicated: Secondary | ICD-10-CM | POA: Diagnosis present

## 2018-03-04 DIAGNOSIS — Z7951 Long term (current) use of inhaled steroids: Secondary | ICD-10-CM

## 2018-03-04 DIAGNOSIS — Z79899 Other long term (current) drug therapy: Secondary | ICD-10-CM | POA: Diagnosis not present

## 2018-03-04 DIAGNOSIS — K859 Acute pancreatitis without necrosis or infection, unspecified: Secondary | ICD-10-CM | POA: Diagnosis present

## 2018-03-04 LAB — GLUCOSE, CAPILLARY: Glucose-Capillary: 137 mg/dL — ABNORMAL HIGH (ref 70–99)

## 2018-03-04 LAB — COMPREHENSIVE METABOLIC PANEL
ALK PHOS: 174 U/L — AB (ref 38–126)
ALT: 319 U/L — ABNORMAL HIGH (ref 0–44)
ANION GAP: 9 (ref 5–15)
AST: 299 U/L — ABNORMAL HIGH (ref 15–41)
Albumin: 3.4 g/dL — ABNORMAL LOW (ref 3.5–5.0)
BUN: 8 mg/dL (ref 6–20)
CALCIUM: 8.7 mg/dL — AB (ref 8.9–10.3)
CO2: 24 mmol/L (ref 22–32)
Chloride: 106 mmol/L (ref 98–111)
Creatinine, Ser: 0.64 mg/dL (ref 0.44–1.00)
GFR calc non Af Amer: >60 mL/min (ref >60)
Glucose, Bld: 109 mg/dL — ABNORMAL HIGH (ref 70–99)
Potassium: 3.3 mmol/L — ABNORMAL LOW (ref 3.5–5.1)
SODIUM: 139 mmol/L (ref 135–145)
Total Bilirubin: 1.2 mg/dL (ref 0.3–1.2)
Total Protein: 6.8 g/dL (ref 6.5–8.1)

## 2018-03-04 LAB — PROTIME-INR
INR: 0.99
Prothrombin Time: 13 seconds (ref 11.4–15.2)

## 2018-03-04 LAB — LIPASE, BLOOD: LIPASE: 53 U/L — AB (ref 11–51)

## 2018-03-04 LAB — TROPONIN I

## 2018-03-04 LAB — CBC
HCT: 37.6 % (ref 35.0–47.0)
HEMOGLOBIN: 13.1 g/dL (ref 12.0–16.0)
MCH: 32.9 pg (ref 26.0–34.0)
MCHC: 34.9 g/dL (ref 32.0–36.0)
MCV: 94.3 fL (ref 80.0–100.0)
Platelets: 276 10*3/uL (ref 150–440)
RBC: 3.98 MIL/uL (ref 3.80–5.20)
RDW: 12.7 % (ref 11.5–14.5)
WBC: 6.4 10*3/uL (ref 3.6–11.0)

## 2018-03-04 LAB — ETHANOL

## 2018-03-04 MED ORDER — LORAZEPAM 2 MG/ML IJ SOLN: 1.0000 mg | Freq: Four times a day (QID) | INTRAMUSCULAR | Status: DC | PRN

## 2018-03-04 MED ORDER — HYDRALAZINE HCL 20 MG/ML IJ SOLN: 10.0000 mg | Freq: Four times a day (QID) | INTRAMUSCULAR | Status: DC | PRN

## 2018-03-04 MED ORDER — FOLIC ACID 1 MG PO TABS
1.0000 mg | ORAL_TABLET | Freq: Every day | ORAL | Status: DC
  Administered 2018-03-05 – 2018-03-07 (×3): 1 mg via ORAL
  Filled 2018-03-04 (×3): qty 1

## 2018-03-04 MED ORDER — QUETIAPINE FUMARATE 200 MG PO TABS
200.0000 mg | ORAL_TABLET | Freq: Every day | ORAL | Status: DC
  Administered 2018-03-04 – 2018-03-06 (×3): 200 mg via ORAL
  Filled 2018-03-04 (×4): qty 1

## 2018-03-04 MED ORDER — FENTANYL CITRATE (PF) 100 MCG/2ML IJ SOLN
100.0000 ug | Freq: Once | INTRAMUSCULAR | Status: AC
  Administered 2018-03-04: 100 ug via INTRAVENOUS
  Filled 2018-03-04: qty 2

## 2018-03-04 MED ORDER — INSULIN ASPART 100 UNIT/ML ~~LOC~~ SOLN: 0.0000 [IU] | Freq: Every day | SUBCUTANEOUS | Status: DC

## 2018-03-04 MED ORDER — ENOXAPARIN SODIUM 40 MG/0.4ML ~~LOC~~ SOLN
40.0000 mg | SUBCUTANEOUS | Status: DC
  Administered 2018-03-04 – 2018-03-06 (×3): 40 mg via SUBCUTANEOUS
  Filled 2018-03-04 (×3): qty 0.4

## 2018-03-04 MED ORDER — BUPROPION HCL ER (SR) 150 MG PO TB12
150.0000 mg | ORAL_TABLET | Freq: Two times a day (BID) | ORAL | Status: DC
  Administered 2018-03-04 – 2018-03-07 (×6): 150 mg via ORAL
  Filled 2018-03-04 (×7): qty 1

## 2018-03-04 MED ORDER — VITAMIN B-1 100 MG PO TABS
100.0000 mg | ORAL_TABLET | Freq: Every day | ORAL | Status: DC
  Administered 2018-03-05 – 2018-03-07 (×3): 100 mg via ORAL
  Filled 2018-03-04 (×3): qty 1

## 2018-03-04 MED ORDER — ONDANSETRON HCL 4 MG/2ML IJ SOLN
4.0000 mg | Freq: Four times a day (QID) | INTRAMUSCULAR | Status: DC | PRN
  Administered 2018-03-04: 4 mg via INTRAVENOUS
  Filled 2018-03-04: qty 2

## 2018-03-04 MED ORDER — ADULT MULTIVITAMIN W/MINERALS CH
1.0000 | ORAL_TABLET | Freq: Every day | ORAL | Status: DC
  Administered 2018-03-05 – 2018-03-07 (×3): 1 via ORAL
  Filled 2018-03-04 (×3): qty 1

## 2018-03-04 MED ORDER — HYDROCODONE-ACETAMINOPHEN 5-325 MG PO TABS
1.0000 | ORAL_TABLET | ORAL | Status: DC | PRN
  Administered 2018-03-05: 2 via ORAL
  Administered 2018-03-05 (×2): 1 via ORAL
  Filled 2018-03-04 (×2): qty 2
  Filled 2018-03-04: qty 1

## 2018-03-04 MED ORDER — ACETAMINOPHEN 325 MG PO TABS
650.0000 mg | ORAL_TABLET | Freq: Four times a day (QID) | ORAL | Status: DC | PRN
  Administered 2018-03-05 – 2018-03-06 (×2): 650 mg via ORAL
  Filled 2018-03-04 (×2): qty 2

## 2018-03-04 MED ORDER — LISINOPRIL 10 MG PO TABS
10.0000 mg | ORAL_TABLET | Freq: Every day | ORAL | Status: DC
  Administered 2018-03-05 – 2018-03-07 (×3): 10 mg via ORAL
  Filled 2018-03-04 (×3): qty 1

## 2018-03-04 MED ORDER — POTASSIUM CHLORIDE CRYS ER 20 MEQ PO TBCR
40.0000 meq | EXTENDED_RELEASE_TABLET | Freq: Once | ORAL | Status: AC
  Administered 2018-03-04: 40 meq via ORAL
  Filled 2018-03-04: qty 2

## 2018-03-04 MED ORDER — LORAZEPAM 1 MG PO TABS: 1.0000 mg | ORAL_TABLET | Freq: Four times a day (QID) | ORAL | Status: DC | PRN

## 2018-03-04 MED ORDER — ONDANSETRON HCL 4 MG/2ML IJ SOLN
4.0000 mg | Freq: Once | INTRAMUSCULAR | Status: AC
  Administered 2018-03-04: 4 mg via INTRAVENOUS
  Filled 2018-03-04: qty 2

## 2018-03-04 MED ORDER — ACETAMINOPHEN 650 MG RE SUPP: 650.0000 mg | Freq: Four times a day (QID) | RECTAL | Status: DC | PRN

## 2018-03-04 MED ORDER — THIAMINE HCL 100 MG/ML IJ SOLN: 100.0000 mg | Freq: Every day | INTRAMUSCULAR | Status: DC

## 2018-03-04 MED ORDER — ASPIRIN EC 81 MG PO TBEC
81.0000 mg | DELAYED_RELEASE_TABLET | Freq: Every day | ORAL | Status: DC
  Administered 2018-03-05 – 2018-03-07 (×3): 81 mg via ORAL
  Filled 2018-03-04 (×3): qty 1

## 2018-03-04 MED ORDER — ONDANSETRON HCL 4 MG PO TABS: 4.0000 mg | ORAL_TABLET | Freq: Four times a day (QID) | ORAL | Status: DC | PRN

## 2018-03-04 MED ORDER — ALBUTEROL SULFATE (2.5 MG/3ML) 0.083% IN NEBU
2.5000 mg | INHALATION_SOLUTION | RESPIRATORY_TRACT | Status: DC | PRN
  Administered 2018-03-05 – 2018-03-06 (×2): 2.5 mg via RESPIRATORY_TRACT
  Filled 2018-03-04 (×2): qty 3

## 2018-03-04 MED ORDER — IOPAMIDOL (ISOVUE-300) INJECTION 61%
100.0000 mL | Freq: Once | INTRAVENOUS | Status: AC | PRN
  Administered 2018-03-04: 100 mL via INTRAVENOUS

## 2018-03-04 MED ORDER — SENNOSIDES-DOCUSATE SODIUM 8.6-50 MG PO TABS: 1.0000 | ORAL_TABLET | Freq: Every evening | ORAL | Status: DC | PRN

## 2018-03-04 MED ORDER — MORPHINE SULFATE (PF) 4 MG/ML IV SOLN
4.0000 mg | INTRAVENOUS | Status: DC | PRN
  Administered 2018-03-04: 4 mg via INTRAVENOUS
  Filled 2018-03-04: qty 1

## 2018-03-04 MED ORDER — INSULIN ASPART 100 UNIT/ML ~~LOC~~ SOLN
0.0000 [IU] | Freq: Three times a day (TID) | SUBCUTANEOUS | Status: DC
  Administered 2018-03-05: 1 [IU] via SUBCUTANEOUS
  Administered 2018-03-05: 3 [IU] via SUBCUTANEOUS
  Administered 2018-03-06: 2 [IU] via SUBCUTANEOUS
  Administered 2018-03-06 – 2018-03-07 (×2): 1 [IU] via SUBCUTANEOUS
  Filled 2018-03-04 (×5): qty 1

## 2018-03-04 MED ORDER — SODIUM CHLORIDE 0.9 % IV SOLN
INTRAVENOUS | Status: DC
  Administered 2018-03-04 – 2018-03-06 (×4): via INTRAVENOUS
  Administered 2018-03-06: 100 mL/h via INTRAVENOUS
  Administered 2018-03-07: 03:00:00 via INTRAVENOUS

## 2018-03-04 NOTE — ED Triage Notes (Signed)
Epigastric pain x2days

## 2018-03-04 NOTE — ED Provider Notes (Signed)
[facility] Emergency Department Provider Note  ____________________________________________   First MD Initiated Contact with Patient 03/04/18 1853     (approximate)  I have reviewed the triage vital signs and the nursing notes.   HISTORY  Chief Complaint Abdominal Pain   HPI Pamela Lane is a 57 y.o. female comes to the emergency department with roughly 24 hours of gradual onset slowly progressive moderate to severe epigastric pain radiating straight to her back associated with nausea and vomiting.  She is unable to keep any food down.  She has a past medical history of alcohol abuse along with poorly controlled diabetes mellitus and I actually admitted her to the hospital 2 weeks ago for DKA.  She says she has been controlling her sugar better however the pain has made it more difficult.  She denies a history of abdominal surgeries aside from hysterectomy.  She is passing flatus although says she has not had a bowel movement and "a long time".  Her pain is currently moderate to severe worse when trying to eat and improved when not.    Past Medical History:  Diagnosis Date  . Asthma   . Diabetes mellitus without complication ([place])   . Hypertension   . Mental disorder    pt reports 'I have all of them mental disorders'    Patient Active Problem List   Diagnosis Date Noted  . Acute pancreatitis 05/15/2016  . AKI (acute kidney injury) ([place])   . Diabetic ketoacidosis without coma associated with type 2 diabetes mellitus ([place])   . Hyperglycemia due to type 2 diabetes mellitus ([place]) 01/13/2016  . UTI (urinary tract infection) 01/13/2016  . DKA (diabetic ketoacidoses) ([place]) 01/08/2016  . Hyponatremia 01/08/2016  . Dehydration 01/08/2016  . Urinary tract infection 01/08/2016  . Upper abdominal pain 01/08/2016  . Nausea & vomiting 01/08/2016    Past Surgical History:  Procedure Laterality Date  . ABDOMINAL HYSTERECTOMY      Prior to Admission  medications   Medication Sig Start Date End Date Taking? Authorizing Provider  albuterol (PROVENTIL HFA;VENTOLIN HFA) 108 (90 Base) MCG/ACT inhaler Inhale 2 puffs into the lungs every 6 (six) hours as needed for wheezing or shortness of breath. 02/18/18  Yes Dustin Flock, MD  aspirin EC 81 MG tablet Take 81 mg by mouth daily.   Yes [provider]  blood glucose meter kit and supplies KIT Dispense based on patient and insurance preference. Use up to four times daily as directed. (FOR ICD-9 250.00, 250.01). 02/18/18  Yes Dustin Flock, MD  buPROPion [facility] SR) 150 MG 12 hr tablet Take 1 tablet (150 mg total) by mouth 2 (two) times daily. 02/18/18 03/20/18 Yes Dustin Flock, MD  insulin aspart protamine- aspart (NOVOLOG MIX 70/30) (70-30) 100 UNIT/ML injection Inject 0.32 mLs (32 Units total) into the skin 2 (two) times daily with a meal. 02/18/18 03/20/18 Yes Dustin Flock, MD  ipratropium-albuterol (DUONEB) 0.5-2.5 (3) MG/3ML SOLN Take 3 mLs by nebulization every 6 (six) hours as needed for wheezing, shortness of breath or cough. 02/23/18  Yes [provider]  lisinopril (PRINIVIL) 10 MG tablet Take 1 tablet (10 mg total) by mouth daily. 02/18/18  Yes Dustin Flock, MD  metFORMIN (GLUCOPHAGE) 500 MG tablet Take 1 tablet (500 mg total) by mouth 2 (two) times daily with a meal. 02/18/18  Yes Dustin Flock, MD  QUEtiapine (SEROQUEL) 200 MG tablet Take 200 mg by mouth at bedtime. 03/02/18  Yes [provider]  QUEtiapine (  SEROQUEL) 25 MG tablet Take 25 mg by mouth every 4 (four) hours as needed. 03/02/18  Yes [provider]    Allergies Patient has no known allergies.  Family History  Problem Relation Age of Onset  . Hypertension Mother     Social History Social History   Tobacco Use  . Smoking status: Current Every Day Smoker    Packs/day: 0.10    Types: Cigarettes  . Smokeless tobacco: Never Used  Substance Use Topics  . Alcohol use: Yes     Alcohol/week: 40.0 standard drinks    Types: 40 Cans of beer per week    Comment: 3 big bottles of liquor  . Drug use: Yes    Types: Marijuana, Cocaine    Review of Systems Constitutional: No fever/chills Eyes: No visual changes. ENT: No sore throat. Cardiovascular: Denies chest pain. Respiratory: Denies shortness of breath. Gastrointestinal: Positive for abdominal pain.  Positive for nausea, positive for vomiting.  No diarrhea.  Positive for constipation. Genitourinary: Negative for dysuria. Musculoskeletal: Negative for back pain. Skin: Negative for rash. Neurological: Negative for headaches, focal weakness or numbness.   ____________________________________________   PHYSICAL EXAM:  VITAL SIGNS: ED Triage Vitals  Enc Vitals Group     BP 03/04/18 1554 (!) 172/99     Pulse Rate 03/04/18 1554 92     Resp 03/04/18 1554 18     Temp 03/04/18 1554 98.5 F (36.9 C)     Temp Source 03/04/18 1554 Oral     SpO2 03/04/18 1554 99 %     Weight 03/04/18 1555 199 lb (90.3 kg)     Height 03/04/18 1555 '5\' 2"'$  (1.575 m)     Head Circumference --      Peak Flow --      Pain Score 03/04/18 1558 10     Pain Loc --      Pain Edu? --      Excl. in [place]? --     Constitutional: Alert and oriented x4 obviously uncomfortable holding her abdomen and tearful Eyes: PERRL EOMI. Head: Atraumatic. Nose: No congestion/rhinnorhea. Mouth/Throat: No trismus Neck: No stridor.   Cardiovascular: Normal rate, regular rhythm. Grossly normal heart sounds.  Good peripheral circulation. Respiratory: Normal respiratory effort.  No retractions. Lungs CTAB and moving good air Gastrointestinal: Soft diffuse upper abdominal tenderness with no rebound or guarding no peritonitis Musculoskeletal: No lower extremity edema   Neurologic:  Normal speech and language. No gross focal neurologic deficits are appreciated. Skin:  Skin is warm, dry and intact. No rash noted. Psychiatric: Mood and affect are normal. Speech  and behavior are normal.    ____________________________________________   DIFFERENTIAL includes but not limited to  Appendicitis, bowel obstruction, diverticulitis, cholelithiasis, pancreatitis ____________________________________________   LABS (all labs ordered are listed, but only abnormal results are displayed)  Labs Reviewed  LIPASE, BLOOD - Abnormal; Notable for the following components:      Result Value   Lipase 53 (*)    All other components within normal limits  COMPREHENSIVE METABOLIC PANEL - Abnormal; Notable for the following components:   Potassium 3.3 (*)    Glucose, Bld 109 (*)    Calcium 8.7 (*)    Albumin 3.4 (*)    AST 299 (*)    ALT 319 (*)    Alkaline Phosphatase 174 (*)    All other components within normal limits  CBC  TROPONIN I  ETHANOL  PROTIME-INR    Lab work reviewed by me shows slightly elevated  lipase but significant transaminitis __________________________________________  EKG  ED ECG REPORT I, Darel Hong, the attending physician, personally viewed and interpreted this ECG.  Date: 03/04/2018 EKG Time:  Rate: 91 Rhythm: normal sinus rhythm QRS Axis: normal Intervals: First-degree AV block and prolonged QTC ST/T Wave abnormalities: normal Narrative Interpretation: no evidence of acute ischemia  ____________________________________________  RADIOLOGY  Right upper quadrant ultrasound reviewed by me consistent with fatty liver otherwise unremarkable CT scan abdomen pelvis reviewed by me consistent with early pancreatitis ____________________________________________   PROCEDURES  Procedure(s) performed: no  Procedures  Critical Care performed: no  ____________________________________________   INITIAL IMPRESSION / ASSESSMENT AND PLAN / ED COURSE  Pertinent labs & imaging results that were available during my care of the patient were reviewed by me and considered in my medical decision making (see chart for  details).   As part of my medical decision making, I reviewed the following data within the [place] History obtained from family if available, nursing notes, old chart and ekg, as well as notes from prior ED visits.  Patient arrives obviously uncomfortable with upper abdominal pain nausea and vomiting.  Of establish an IV will give fluids make her n.p.o. and give IV fentanyl 100 mcg and 4 mg of IV ondansetron for pain and nausea.  Right upper quadrant ultrasound is pending given the abnormal LFTs however she has not had a bowel movement and has a history of abdominal surgery social require a CT scan with IV contrast as well.  Patient's ultrasound is reassuring with only fatty liver but no evidence of biliary disease.  CT scan is concerning for new onset pancreatitis.  Pancreatitis requires 2 of the 3: Abnormal lipase 3 times greater than normal, a good history, imaging results.  She has the latter 2 and as such she will require inpatient admission for acute pancreatitis as she is unable to keep down food and requires IV pain control and IV antiemetics.  I discussed with the hospitalist Dr. Bridgett Larsson who has graciously agreed to admit the patient to his service.      ____________________________________________   FINAL CLINICAL IMPRESSION(S) / ED DIAGNOSES  Final diagnoses:  Pain  Acute pancreatitis, unspecified complication status, unspecified pancreatitis type      NEW MEDICATIONS STARTED DURING THIS VISIT:  New Prescriptions   No medications on file     Note:  This document was prepared using Dragon voice recognition software and may include unintentional dictation errors.     Darel Hong, MD 03/04/18 2103

## 2018-03-04 NOTE — ED Notes (Signed)
First Nurse Note:  Abdominal pain x 3 days.  Hypertensive.

## 2018-03-04 NOTE — H&P (Signed)
[facility] [place] NAME: Pamela Lane    MR#:  [id number]  DATE OF BIRTH:  [birth year]  DATE OF ADMISSION:  03/04/2018  PRIMARY CARE PHYSICIAN: [facility], [place]   REQUESTING/REFERRING PHYSICIAN: Darel Hong, MD  CHIEF COMPLAINT:   Chief Complaint  Patient presents with  . Abdominal Pain   Abdominal pain for 3 days. HISTORY OF PRESENT ILLNESS:  Pamela Lane  is a 57 y.o. female with a known history of multiple medical problems as below.  The patient was just discharged after treatment of DKA in the hospital several days ago.  After discharge, she continued to drink alcohol and smoke.  She said he drank 2 cans of beer yesterday.  She started to have abdominal pain 3 days ago, which is in the middle part of the abdomen, constant, 8 out of 10 without radiation.  The patient also has a nausea and vomiting but no diarrhea, melena or bloody stool.  Lipase is 53 but the CAT scan of the abdomen show edema surrounding the pancreatic head.  PAST MEDICAL HISTORY:   Past Medical History:  Diagnosis Date  . Asthma   . Diabetes mellitus without complication ([place])   . Hypertension   . Mental disorder    pt reports 'I have all of them mental disorders'    PAST SURGICAL HISTORY:   Past Surgical History:  Procedure Laterality Date  . ABDOMINAL HYSTERECTOMY      SOCIAL HISTORY:   Social History   Tobacco Use  . Smoking status: Current Every Day Smoker    Packs/day: 0.10    Types: Cigarettes  . Smokeless tobacco: Never Used  Substance Use Topics  . Alcohol use: Yes    Alcohol/week: 40.0 standard drinks    Types: 40 Cans of beer per week    Comment: 3 big bottles of liquor    FAMILY HISTORY:   Family History  Problem Relation Age of Onset  . Hypertension Mother     DRUG ALLERGIES:  No Known Allergies  REVIEW OF SYSTEMS:   Review of Systems  Constitutional: Negative for chills, fever and  malaise/fatigue.  HENT: Negative for sore throat.   Eyes: Negative for blurred vision and double vision.  Respiratory: Negative for cough, hemoptysis, shortness of breath, wheezing and stridor.   Cardiovascular: Negative for chest pain, palpitations, orthopnea and leg swelling.  Gastrointestinal: Positive for abdominal pain, nausea and vomiting. Negative for blood in stool, diarrhea and melena.  Genitourinary: Negative for dysuria, flank pain and hematuria.  Musculoskeletal: Negative for back pain and joint pain.  Skin: Negative for rash.  Neurological: Negative for dizziness, sensory change, focal weakness, seizures, loss of consciousness, weakness and headaches.  Endo/Heme/Allergies: Negative for polydipsia.  Psychiatric/Behavioral: Negative for depression. The patient is not nervous/anxious.     MEDICATIONS AT HOME:   Prior to Admission medications   Medication Sig Start Date End Date Taking? Authorizing Provider  albuterol (PROVENTIL HFA;VENTOLIN HFA) 108 (90 Base) MCG/ACT inhaler Inhale 2 puffs into the lungs every 6 (six) hours as needed for wheezing or shortness of breath. 02/18/18  Yes Dustin Flock, MD  aspirin EC 81 MG tablet Take 81 mg by mouth daily.   Yes [provider]  blood glucose meter kit and supplies KIT Dispense based on patient and insurance preference. Use up to four times daily as directed. (FOR ICD-9 250.00, 250.01). 02/18/18  Yes Dustin Flock, MD  buPROPion [facility] SR) 150 MG 12  hr tablet Take 1 tablet (150 mg total) by mouth 2 (two) times daily. 02/18/18 03/20/18 Yes Dustin Flock, MD  insulin aspart protamine- aspart (NOVOLOG MIX 70/30) (70-30) 100 UNIT/ML injection Inject 0.32 mLs (32 Units total) into the skin 2 (two) times daily with a meal. 02/18/18 03/20/18 Yes Dustin Flock, MD  ipratropium-albuterol (DUONEB) 0.5-2.5 (3) MG/3ML SOLN Take 3 mLs by nebulization every 6 (six) hours as needed for wheezing, shortness of breath or cough. 02/23/18  Yes  [provider]  lisinopril (PRINIVIL) 10 MG tablet Take 1 tablet (10 mg total) by mouth daily. 02/18/18  Yes Dustin Flock, MD  metFORMIN (GLUCOPHAGE) 500 MG tablet Take 1 tablet (500 mg total) by mouth 2 (two) times daily with a meal. 02/18/18  Yes Dustin Flock, MD  QUEtiapine (SEROQUEL) 200 MG tablet Take 200 mg by mouth at bedtime. 03/02/18  Yes [provider]  QUEtiapine (SEROQUEL) 25 MG tablet Take 25 mg by mouth every 4 (four) hours as needed. 03/02/18  Yes [provider]      VITAL SIGNS:  Blood pressure (!) 182/100, pulse 90, temperature 98.5 F (36.9 C), temperature source Oral, resp. rate 16, height _0  (1.575 m), weight 90.3 kg, SpO2 99 %.  PHYSICAL EXAMINATION:  Physical Exam  GENERAL:  57 y.o.-year-old patient lying in the bed with no acute distress.  Obesity. EYES: Pupils equal, round, reactive to light and accommodation. No scleral icterus. Extraocular muscles intact.  HEENT: Head atraumatic, normocephalic. Oropharynx and nasopharynx clear.  NECK:  Supple, no jugular venous distention. No thyroid enlargement, no tenderness.  LUNGS: Normal breath sounds bilaterally, no wheezing, rales,rhonchi or crepitation. No use of accessory muscles of respiration.  CARDIOVASCULAR: S1, S2 normal. No murmurs, rubs, or gallops.  ABDOMEN: Soft, tenderness, nondistended. Bowel sounds present. No organomegaly or mass.  EXTREMITIES: No pedal edema, cyanosis, or clubbing.  NEUROLOGIC: Cranial nerves II through XII are intact. Muscle strength 5/5 in all extremities. Sensation intact. Gait not checked.  PSYCHIATRIC: The patient is alert and oriented x 3.  SKIN: No obvious rash, lesion, or ulcer.   LABORATORY PANEL:   CBC Recent Labs  Lab 03/04/18 1600  WBC 6.4  HGB 13.1  HCT 37.6  PLT 276   ------------------------------------------------------------------------------------------------------------------  Chemistries  Recent Labs  Lab 03/04/18 1600    NA 139  K 3.3*  CL 106  CO2 24  GLUCOSE 109*  BUN 8  CREATININE 0.64  CALCIUM 8.7*  AST 299*  ALT 319*  ALKPHOS 174*  BILITOT 1.2   ------------------------------------------------------------------------------------------------------------------  Cardiac Enzymes Recent Labs  Lab 03/04/18 1600  TROPONINI <0.03   ------------------------------------------------------------------------------------------------------------------  RADIOLOGY:  Ct Abdomen Pelvis W Contrast  Result Date: 03/04/2018 CLINICAL DATA:  Abdominal pain for 3 days. EXAM: CT ABDOMEN AND PELVIS WITH CONTRAST TECHNIQUE: Multidetector CT imaging of the abdomen and pelvis was performed using the standard protocol following bolus administration of intravenous contrast. CONTRAST:  180m ISOVUE-300 IOPAMIDOL (ISOVUE-300) INJECTION 61% COMPARISON:  02/17/2018 FINDINGS: Lower chest: Lung bases are clear. No effusions. Heart is normal size. Hepatobiliary: No focal hepatic abnormality. Gallbladder unremarkable. Pancreas: There is haziness noted around the pancreatic head and proximal duodenum. No focal pancreatic abnormality or ductal dilatation. Spleen: No focal abnormality.  Normal size. Adrenals/Urinary Tract: No adrenal abnormality. No focal renal abnormality. No stones or hydronephrosis. Urinary bladder is unremarkable. Stomach/Bowel: No visible duodenal wall thickening. Stomach, large and small bowel grossly unremarkable. Vascular/Lymphatic: Aortic atherosclerosis. No enlarged abdominal or pelvic lymph nodes. Reproductive: Prior hysterectomy.  No  adnexal masses. Other: Small amount of free fluid in the right anterior pararenal space. No free peritoneal fluid or free air. Musculoskeletal: No acute bony abnormality. Predominately fat density mass noted in the right flank/lateral hip region as seen on prior CT, unchanged. IMPRESSION: Haziness noted around the pancreatic head, proximal duodenum, with fluid in the  retroperitoneum/anterior pararenal space on the right. Findings concerning for early acute pancreatitis. Recommend correlation with laboratory values. Stable fat containing mass in the subcutaneous soft tissues of the right hip/flank. Electronically Signed   By: Rolm Baptise M.D.   On: 03/04/2018 19:19   US Abdomen Limited Ruq  Result Date: 03/04/2018 CLINICAL DATA:  Abdominal pain for 3 days EXAM: ULTRASOUND ABDOMEN LIMITED RIGHT UPPER QUADRANT COMPARISON:  CT 02/17/2018 FINDINGS: Gallbladder: No gallstones or wall thickening visualized. No sonographic Murphy sign noted by sonographer. Common bile duct: Diameter: Normal caliber, 4 mm. Liver: No focal lesion identified. Within normal limits in parenchymal echogenicity. Portal vein is patent on color Doppler imaging with normal direction of blood flow towards the liver. IMPRESSION: Fatty infiltration of the liver.  No acute findings. Electronically Signed   By: Rolm Baptise M.D.   On: 03/04/2018 19:42      IMPRESSION AND PLAN:   Acute pancreatitis The patient will be admitted to medical floor. N.p.o. except medication, IV fluid support, pain control.  Hypokalemia.  Potassium supplement, follow-up potassium and magnesium level.  Abnormal liver function test, possible due to alcohol abuse.  Diabetes.  Start sliding scale, hold metformin.  Alcohol abuse.  Start CIWA protocol.  Tobacco abuse.  Smoking cessation was counseled for 3 to 4 minutes.  She does not want nicotine patch.  All the records are reviewed and case discussed with ED provider. Management plans discussed with the patient, family and they are in agreement.  CODE STATUS: Full code.  TOTAL TIME TAKING CARE OF THIS PATIENT: 42 minutes.    Demetrios Loll M.D on 03/04/2018 at 8:35 PM  Between 7am to 6pm - Pager - [phone]  After 6pm go to [email or web address] - Technical brewer [place] [facility]  Office  [phone]  CC: Primary care physician; [facility],  [place]   Note: This dictation was prepared with Diplomatic Services operational officer dictation along with smaller phrase technology. Any transcriptional errors that result from this process are unin

## 2018-03-05 ENCOUNTER — Encounter: Payer: Self-pay | Admitting: *Deleted

## 2018-03-05 ENCOUNTER — Other Ambulatory Visit: Payer: Self-pay

## 2018-03-05 LAB — MAGNESIUM: Magnesium: 1.7 mg/dL (ref 1.7–2.4)

## 2018-03-05 LAB — BASIC METABOLIC PANEL
Anion gap: 6 (ref 5–15)
BUN: 6 mg/dL (ref 6–20)
CHLORIDE: 110 mmol/L (ref 98–111)
CO2: 25 mmol/L (ref 22–32)
Calcium: 7.9 mg/dL — ABNORMAL LOW (ref 8.9–10.3)
Creatinine, Ser: 0.58 mg/dL (ref 0.44–1.00)
GFR calc Af Amer: >60 mL/min (ref >60)
GFR calc non Af Amer: >60 mL/min (ref >60)
Glucose, Bld: 149 mg/dL — ABNORMAL HIGH (ref 70–99)
POTASSIUM: 3.5 mmol/L (ref 3.5–5.1)
SODIUM: 141 mmol/L (ref 135–145)

## 2018-03-05 LAB — CBC
HEMATOCRIT: 34.5 % — AB (ref 35.0–47.0)
Hemoglobin: 12.1 g/dL (ref 12.0–16.0)
MCH: 32.9 pg (ref 26.0–34.0)
MCHC: 35.2 g/dL (ref 32.0–36.0)
MCV: 93.5 fL (ref 80.0–100.0)
Platelets: 237 10*3/uL (ref 150–440)
RBC: 3.69 MIL/uL — ABNORMAL LOW (ref 3.80–5.20)
RDW: 12.5 % (ref 11.5–14.5)
WBC: 4.5 10*3/uL (ref 3.6–11.0)

## 2018-03-05 LAB — GLUCOSE, CAPILLARY
GLUCOSE-CAPILLARY: 148 mg/dL — AB (ref 70–99)
GLUCOSE-CAPILLARY: 117 mg/dL — AB (ref 70–99)
Glucose-Capillary: 216 mg/dL — ABNORMAL HIGH (ref 70–99)
Glucose-Capillary: 110 mg/dL — ABNORMAL HIGH (ref 70–99)

## 2018-03-05 LAB — LIPASE, BLOOD: Lipase: 27 U/L (ref 11–51)

## 2018-03-05 MED ORDER — MORPHINE SULFATE (PF) 2 MG/ML IV SOLN
2.0000 mg | INTRAVENOUS | Status: DC | PRN
  Administered 2018-03-05: 2 mg via INTRAVENOUS
  Filled 2018-03-05: qty 1

## 2018-03-05 MED ORDER — INSULIN ASPART PROT & ASPART (70-30 MIX) 100 UNIT/ML ~~LOC~~ SUSP: 32.0000 [IU] | Freq: Two times a day (BID) | SUBCUTANEOUS | Status: DC

## 2018-03-05 MED ORDER — METFORMIN HCL 500 MG PO TABS: 500.0000 mg | ORAL_TABLET | Freq: Two times a day (BID) | ORAL | Status: DC

## 2018-03-05 NOTE — Progress Notes (Signed)
[facility] [place] at [facility]   PATIENT NAME: Keyauna Graefe    MR#:  [id number]  DATE OF BIRTH:  [birth year]  SUBJECTIVE:   Came in with abdominal pain and  Now frustrated since she wants to eat she is hungry. REVIEW OF SYSTEMS:   Review of Systems  Constitutional: Negative for chills, fever and weight loss.  HENT: Negative for ear discharge, ear pain and nosebleeds.   Eyes: Negative for blurred vision, pain and discharge.  Respiratory: Negative for sputum production, shortness of breath, wheezing and stridor.   Cardiovascular: Negative for chest pain, palpitations, orthopnea and PND.  Gastrointestinal: Positive for abdominal pain. Negative for diarrhea, nausea and vomiting.  Genitourinary: Negative for frequency and urgency.  Musculoskeletal: Negative for back pain and joint pain.  Neurological: Negative for sensory change, speech change, focal weakness and weakness.  Psychiatric/Behavioral: Negative for depression and hallucinations. The patient is not nervous/anxious.    Tolerating Diet: no has abd pain  DRUG ALLERGIES:  No Known Allergies  VITALS:  Blood pressure (!) 141/110, pulse 99, temperature 98.9 F (37.2 C), temperature source Oral, resp. rate 20, height 5\' 2"  (1.575 m), weight 90.3 kg, SpO2 97 %.  PHYSICAL EXAMINATION:   Physical Exam  GENERAL:  57 y.o.-year-old patient lying in the bed with no acute distress.  EYES: Pupils equal, round, reactive to light and accommodation. No scleral icterus. Extraocular muscles intact.  HEENT: Head atraumatic, normocephalic. Oropharynx and nasopharynx clear.  NECK:  Supple, no jugular venous distention. No thyroid enlargement, no tenderness.  LUNGS: Normal breath sounds bilaterally, no wheezing, rales, rhonchi. No use of accessory muscles of respiration.  CARDIOVASCULAR: S1, S2 normal. No murmurs, rubs, or gallops.  ABDOMEN: Soft, diffusely tender, nondistended. Bowel sounds present. No  organomegaly or mass.  EXTREMITIES: No cyanosis, clubbing or edema b/l.    NEUROLOGIC: Cranial nerves II through XII are intact. No focal Motor or sensory deficits b/l.   PSYCHIATRIC:  patient is alert and oriented x 3.  SKIN: No obvious rash, lesion, or ulcer.   LABORATORY PANEL:  CBC Recent Labs  Lab 03/05/18 0608  WBC 4.5  HGB 12.1  HCT 34.5*  PLT 237    Chemistries  Recent Labs  Lab 03/04/18 1600 03/05/18 0608  NA 139 141  K 3.3* 3.5  CL 106 110  CO2 24 25  GLUCOSE 109* 149*  BUN 8 6  CREATININE 0.64 0.58  CALCIUM 8.7* 7.9*  MG  --  1.7  AST 299*  --   ALT 319*  --   ALKPHOS 174*  --   BILITOT 1.2  --    Cardiac Enzymes Recent Labs  Lab 03/04/18 1600  TROPONINI <0.03   RADIOLOGY:  Ct Abdomen Pelvis W Contrast  Result Date: 03/04/2018 CLINICAL DATA:  Abdominal pain for 3 days. EXAM: CT ABDOMEN AND PELVIS WITH CONTRAST TECHNIQUE: Multidetector CT imaging of the abdomen and pelvis was performed using the standard protocol following bolus administration of intravenous contrast. CONTRAST:  ISOVUE-300 IOPAMIDOL (ISOVUE-300) INJECTION 61% COMPARISON:  02/17/2018 FINDINGS: Lower chest: Lung bases are clear. No effusions. Heart is normal size. Hepatobiliary: No focal hepatic abnormality. Gallbladder unremarkable. Pancreas: There is haziness noted around the pancreatic head and proximal duodenum. No focal pancreatic abnormality or ductal dilatation. Spleen: No focal abnormality.  Normal size. Adrenals/Urinary Tract: No adrenal abnormality. No focal renal abnormality. No stones or hydronephrosis. Urinary bladder is unremarkable. Stomach/Bowel: No visible duodenal wall thickening. Stomach, large and small bowel grossly unremarkable.  Vascular/Lymphatic: Aortic atherosclerosis. No enlarged abdominal or pelvic lymph nodes. Reproductive: Prior hysterectomy.  No adnexal masses. Other: Small amount of free fluid in the right anterior pararenal space. No free peritoneal fluid or  free air. Musculoskeletal: No acute bony abnormality. Predominately fat density mass noted in the right flank/lateral hip region as seen on prior CT, unchanged. IMPRESSION: Haziness noted around the pancreatic head, proximal duodenum, with fluid in the retroperitoneum/anterior pararenal space on the right. Findings concerning for early acute pancreatitis. Recommend correlation with laboratory values. Stable fat containing mass in the subcutaneous soft tissues of the right hip/flank. Electronically Signed   By: Charlett NoseKevin  Dover M.D.   On: 03/04/2018 19:19   [place] Abdomen Limited Ruq  Result Date: 03/04/2018 CLINICAL DATA:  Abdominal pain for 3 days EXAM: ULTRASOUND ABDOMEN LIMITED RIGHT UPPER QUADRANT COMPARISON:  CT 02/17/2018 FINDINGS: Gallbladder: No gallstones or wall thickening visualized. No sonographic Murphy sign noted by sonographer. Common bile duct: Diameter: Normal caliber, 4 mm. Liver: No focal lesion identified. Within normal limits in parenchymal echogenicity. Portal vein is patent on color Doppler imaging with normal direction of blood flow towards the liver. IMPRESSION: Fatty infiltration of the liver.  No acute findings. Electronically Signed   By: Charlett NoseKevin  Dover M.D.   On: 03/04/2018 19:42   ASSESSMENT AND PLAN:   Eulogio BearMary Boerner  is a 57 y.o. female with a known history of multiple medical problems as below.  The patient was just discharged after treatment of DKA in the hospital several days ago.  After discharge, she continued to drink alcohol and smoke.  She said he drank 2 cans of beer yesterday.  She started to have abdominal pain 3 days ago  *Acute pancreatitis suspected ETOH induced with h/o drinking N.p.o. except medication, IV fluid support, pain control.  *Hypokalemia.  Potassium supplement, follow-up potassium and magnesium level.  *Abnormal liver function test, possible due to alcohol abuse.  *Diabetes.  Start sliding scale, hold metformin. And insulin since pt  npo  *Alcohol abuse.  Start CIWA protocol.  *Tobacco abuse.  Smoking cessation was counseled for 3 to 4 minutes.  She does not want nicotine patch. Case discussed with Care Management/Social Worker. Management plans discussed with the patient, family and they are in agreement.  CODE STATUS: full  DVT Prophylaxis: *lovenox  TOTAL TIME TAKING CARE OF THIS PATIENT: *30* minutes.  >50% time spent on counselling and coordination of care  POSSIBLE D/C IN *1-2* DAYS, DEPENDING ON CLINICAL CONDITION.  Note: This dictation was prepared with Dragon dictation along with smaller phrase technology. Any transcriptional errors that result from this process are unintentional.  Enedina FinnerSona Sharina Petre M.D on 03/05/2018 at 1:34 PM  Between 7am to 6pm - Pager - [phone]  After 6pm go to [email or web address] - Social research officer, governmentpassword EPAS [facility]  [facility] [place] [facility]  Office  [phone]  CC: Primary care physician; [facility] HealthPatient ID: Laurey MoraleMary G Urias, female   DOB: 04/28/1961, 57 y.o.   MRN: [id number]

## 2018-03-06 LAB — GLUCOSE, CAPILLARY
GLUCOSE-CAPILLARY: 112 mg/dL — AB (ref 70–99)
GLUCOSE-CAPILLARY: 158 mg/dL — AB (ref 70–99)
Glucose-Capillary: 131 mg/dL — ABNORMAL HIGH (ref 70–99)
Glucose-Capillary: 152 mg/dL — ABNORMAL HIGH (ref 70–99)

## 2018-03-06 NOTE — Progress Notes (Signed)
[facility] - [place] at [facility]   PATIENT NAME: Pamela Lane    MR#:  [id number]  DATE OF BIRTH:  1961/05/23  SUBJECTIVE:   Came in with abdominal pain and failed clear liquid diet yesterday Currently abdominal pain is better and wants to try clear liquids very hungry REVIEW OF SYSTEMS:   Review of Systems  Constitutional: Negative for chills, fever and weight loss.  HENT: Negative for ear discharge, ear pain and nosebleeds.   Eyes: Negative for blurred vision, pain and discharge.  Respiratory: Negative for sputum production, shortness of breath, wheezing and stridor.   Cardiovascular: Negative for chest pain, palpitations, orthopnea and PND.  Gastrointestinal: Negative for abdominal pain, diarrhea, nausea and vomiting.  Genitourinary: Negative for frequency and urgency.  Musculoskeletal: Negative for back pain and joint pain.  Neurological: Negative for sensory change, speech change, focal weakness and weakness.  Psychiatric/Behavioral: Negative for depression and hallucinations. The patient is not nervous/anxious.    Tolerating Diet: no has abd pain  DRUG ALLERGIES:  No Known Allergies  VITALS:  Blood pressure 136/84, pulse 100, temperature 98.5 F (36.9 C), temperature source Oral, resp. rate 20, height 5\' 2"  (1.575 m), weight 90.3 kg, SpO2 100 %.  PHYSICAL EXAMINATION:   Physical Exam  GENERAL:  57 y.o.-year-old patient lying in the bed with no acute distress.  EYES: Pupils equal, round, reactive to light and accommodation. No scleral icterus. Extraocular muscles intact.  HEENT: Head atraumatic, normocephalic. Oropharynx and nasopharynx clear.  NECK:  Supple, no jugular venous distention. No thyroid enlargement, no tenderness.  LUNGS: Normal breath sounds bilaterally, no wheezing, rales, rhonchi. No use of accessory muscles of respiration.  CARDIOVASCULAR: S1, S2 normal. No murmurs, rubs, or gallops.  ABDOMEN: Soft, nontender,  nondistended. Bowel sounds present.  EXTREMITIES: No cyanosis, clubbing or edema b/l.    NEUROLOGIC: Cranial nerves II through XII are intact. No focal Motor or sensory deficits b/l.   PSYCHIATRIC:  patient is alert and oriented x 3.  SKIN: No obvious rash, lesion, or ulcer.   LABORATORY PANEL:  CBC Recent Labs  Lab 03/05/18 0608  WBC 4.5  HGB 12.1  HCT 34.5*  PLT 237    Chemistries  Recent Labs  Lab 03/04/18 1600 03/05/18 0608  NA 139 141  K 3.3* 3.5  CL 106 110  CO2 24 25  GLUCOSE 109* 149*  BUN 8 6  CREATININE 0.64 0.58  CALCIUM 8.7* 7.9*  MG  --  1.7  AST 299*  --   ALT 319*  --   ALKPHOS 174*  --   BILITOT 1.2  --    Cardiac Enzymes Recent Labs  Lab 03/04/18 1600  TROPONINI <0.03   RADIOLOGY:  Ct Abdomen Pelvis W Contrast  Result Date: 03/04/2018 CLINICAL DATA:  Abdominal pain for 3 days. EXAM: CT ABDOMEN AND PELVIS WITH CONTRAST TECHNIQUE: Multidetector CT imaging of the abdomen and pelvis was performed using the standard protocol following bolus administration of intravenous contrast. CONTRAST:  ISOVUE-300 IOPAMIDOL (ISOVUE-300) INJECTION 61% COMPARISON:  02/17/2018 FINDINGS: Lower chest: Lung bases are clear. No effusions. Heart is normal size. Hepatobiliary: No focal hepatic abnormality. Gallbladder unremarkable. Pancreas: There is haziness noted around the pancreatic head and proximal duodenum. No focal pancreatic abnormality or ductal dilatation. Spleen: No focal abnormality.  Normal size. Adrenals/Urinary Tract: No adrenal abnormality. No focal renal abnormality. No stones or hydronephrosis. Urinary bladder is unremarkable. Stomach/Bowel: No visible duodenal wall thickening. Stomach, large and small bowel grossly unremarkable. Vascular/Lymphatic:  Aortic atherosclerosis. No enlarged abdominal or pelvic lymph nodes. Reproductive: Prior hysterectomy.  No adnexal masses. Other: Small amount of free fluid in the right anterior pararenal space. No free  peritoneal fluid or free air. Musculoskeletal: No acute bony abnormality. Predominately fat density mass noted in the right flank/lateral hip region as seen on prior CT, unchanged. IMPRESSION: Haziness noted around the pancreatic head, proximal duodenum, with fluid in the retroperitoneum/anterior pararenal space on the right. Findings concerning for early acute pancreatitis. Recommend correlation with laboratory values. Stable fat containing mass in the subcutaneous soft tissues of the right hip/flank. Electronically Signed   By: Charlett NoseKevin  Dover M.D.   On: 03/04/2018 19:19   [place] Abdomen Limited Ruq  Result Date: 03/04/2018 CLINICAL DATA:  Abdominal pain for 3 days EXAM: ULTRASOUND ABDOMEN LIMITED RIGHT UPPER QUADRANT COMPARISON:  CT 02/17/2018 FINDINGS: Gallbladder: No gallstones or wall thickening visualized. No sonographic Murphy sign noted by sonographer. Common bile duct: Diameter: Normal caliber, 4 mm. Liver: No focal lesion identified. Within normal limits in parenchymal echogenicity. Portal vein is patent on color Doppler imaging with normal direction of blood flow towards the liver. IMPRESSION: Fatty infiltration of the liver.  No acute findings. Electronically Signed   By: Charlett NoseKevin  Dover M.D.   On: 03/04/2018 19:42   ASSESSMENT AND PLAN:   Pamela Lane  is a 57 y.o. female with a known history of multiple medical problems as below.  The patient was just discharged after treatment of DKA in the hospital several days ago.  After discharge, she continued to drink alcohol and smoke.  She said he drank 2 cans of beer yesterday.  She started to have abdominal pain 3 days ago  *Acute pancreatitis suspected ETOH induced with h/o drinking Tolerating clear liquid diet today.  Will advance to full liquids tonight and advance as tolerated  IV fluid support, pain control as needed  *Hypokalemia.  Potassium supplement, follow-up potassium at 3.5 and magnesium at 1.7  *Abnormal liver function test, possible  due to alcohol abuse.  *Diabetes.  Start sliding scale, hold metformin. And insulin since pt npo  *Alcohol abuse.   CIWA protocol.  *Tobacco abuse.  Smoking cessation  She does not want nicotine patch. Case discussed with Care Management/Social Worker. Management plans discussed with the patient, family and they are in agreement.  CODE STATUS: full  DVT Prophylaxis: *lovenox  TOTAL TIME TAKING CARE OF THIS PATIENT: *33 minutes.  >50% time spent on counselling and coordination of care  POSSIBLE D/C IN *1-2* DAYS, DEPENDING ON CLINICAL CONDITION.  Note: This dictation was prepared with Dragon dictation along with smaller phrase technology. Any transcriptional errors that result from this process are unintentional.  Ramonita LabAruna Jermie Hippe M.D on 03/06/2018 at 1:40 PM  Between 7am to 6pm - Pager - [phone]  After 6pm go to [email or web address] - Social research officer, governmentpassword EPAS [facility]  [facility] [place] [facility]  Office  [phone]  CC: Primary care physician; [facility] ID: Laurey MoraleMary G Metsker, female   DOB: 09/23/1960, 57 y.o.   MRN: [id number]

## 2018-03-06 NOTE — Progress Notes (Signed)
Pt is tol clear liquid diet well. Ambulating on floor with no complaints. NAD.

## 2018-03-07 LAB — COMPREHENSIVE METABOLIC PANEL
ALT: 145 U/L — AB (ref 0–44)
AST: 74 U/L — ABNORMAL HIGH (ref 15–41)
Albumin: 3.8 g/dL (ref 3.5–5.0)
Alkaline Phosphatase: 162 U/L — ABNORMAL HIGH (ref 38–126)
Anion gap: 6 (ref 5–15)
CHLORIDE: 109 mmol/L (ref 98–111)
CO2: 26 mmol/L (ref 22–32)
CREATININE: 0.69 mg/dL (ref 0.44–1.00)
Calcium: 8.6 mg/dL — ABNORMAL LOW (ref 8.9–10.3)
GFR calc Af Amer: >60 mL/min (ref >60)
Glucose, Bld: 151 mg/dL — ABNORMAL HIGH (ref 70–99)
Potassium: 3.6 mmol/L (ref 3.5–5.1)
Sodium: 141 mmol/L (ref 135–145)
Total Bilirubin: 1.8 mg/dL — ABNORMAL HIGH (ref 0.3–1.2)
Total Protein: 7.3 g/dL (ref 6.5–8.1)

## 2018-03-07 LAB — GLUCOSE, CAPILLARY: Glucose-Capillary: 127 mg/dL — ABNORMAL HIGH (ref 70–99)

## 2018-03-07 MED ORDER — SENNOSIDES-DOCUSATE SODIUM 8.6-50 MG PO TABS: 1.0000 | ORAL_TABLET | Freq: Every evening | ORAL | Status: DC | PRN

## 2018-03-07 MED ORDER — THIAMINE HCL 100 MG PO TABS: 100.0000 mg | ORAL_TABLET | Freq: Every day | ORAL | Status: DC

## 2018-03-07 MED ORDER — FOLIC ACID 1 MG PO TABS: 1.0000 mg | ORAL_TABLET | Freq: Every day | ORAL | 0 refills | Status: DC

## 2018-03-07 MED ORDER — ADULT MULTIVITAMIN W/MINERALS CH: 1.0000 | ORAL_TABLET | Freq: Every day | ORAL | Status: DC

## 2018-03-07 NOTE — Discharge Summary (Signed)
Pamela Lane at [place] NAME: Pamela Lane    MR#:  [id number]  DATE OF BIRTH:  12-16-1960  DATE OF ADMISSION:  03/04/2018 ADMITTING PHYSICIAN: Demetrios Loll, MD  DATE OF DISCHARGE:  03/07/18   PRIMARY CARE PHYSICIAN: [facility] [place]    ADMISSION DIAGNOSIS:  Pain [R52] Acute pancreatitis, unspecified complication status, unspecified pancreatitis type [K85.90]  DISCHARGE DIAGNOSIS:  Active Problems:   Acute pancreatitis   SECONDARY DIAGNOSIS:   Past Medical History:  Diagnosis Date  . Asthma   . Diabetes mellitus without complication ([place])   . Hypertension   . Mental disorder    pt reports 'I have all of them mental disorders'    HOSPITAL COURSE:  [name] a known history of multiple medical problems as below. The patient was just discharged after treatment of DKA in the hospital several days ago. After discharge, she continued to drink alcohol and smoke. She said he drank 2 cans of beer yesterday. She started to have abdominal pain 3 days ago  *Acute pancreatitis 2/2 ETOH induced with h/o drinking Tolerating advanced diet  pain completely improved and does not need any prescriptions for the pain  *Hypokalemia. Potassium supplement, follow-up potassium at 3.6 and magnesium at 1.7  *Abnormal liver function test, possible due to alcohol abuse. LFTs are trending down PCP to consider repeating them during the follow-up visit  *Diabetes. Resume metformin holding NovoLog 70/30 until she sees primary care physician as Accu-Cheks are running at around 120-1 50  *Alcohol abuse.  CIWA protocol.  *Tobacco abuse. Smoking cessation She does not want nicotine patch.  Discharge patient home  DISCHARGE CONDITIONS:  Stable   CONSULTS OBTAINED:     PROCEDURES  None   DRUG ALLERGIES:  No Known Allergies  DISCHARGE MEDICATIONS:   Allergies as of 03/07/2018    No Known Allergies     Medication List    STOP taking these medications   insulin aspart protamine- aspart (70-30) 100 UNIT/ML injection Commonly known as:  NOVOLOG MIX 70/30     TAKE these medications   albuterol 108 (90 Base) MCG/ACT inhaler Commonly known as:  PROVENTIL HFA;VENTOLIN HFA Inhale 2 puffs into the lungs every 6 (six) hours as needed for wheezing or shortness of breath.   aspirin EC 81 MG tablet Take 81 mg by mouth daily.   blood glucose meter kit and supplies Kit Dispense based on patient and insurance preference. Use up to four times daily as directed. (FOR ICD-9 250.00, 250.01).   buPROPion 150 MG 12 hr tablet Commonly known as:  WELLBUTRIN SR Take 1 tablet (150 mg total) by mouth 2 (two) times daily.   folic acid 1 MG tablet Commonly known as:  FOLVITE Take 1 tablet (1 mg total) by mouth daily. Start taking on:  03/08/2018   ipratropium-albuterol 0.5-2.5 (3) MG/3ML Soln Commonly known as:  DUONEB Take 3 mLs by nebulization every 6 (six) hours as needed for wheezing, shortness of breath or cough.   lisinopril 10 MG tablet Commonly known as:  PRINIVIL,ZESTRIL Take 1 tablet (10 mg total) by mouth daily.   metFORMIN 500 MG tablet Commonly known as:  GLUCOPHAGE Take 1 tablet (500 mg total) by mouth 2 (two) times daily with a meal.   multivitamin with minerals Tabs tablet Take 1 tablet by mouth daily. Start taking on:  03/08/2018   QUEtiapine 200 MG tablet Commonly known as:  SEROQUEL Take 200 mg by mouth at  bedtime.   QUEtiapine 25 MG tablet Commonly known as:  SEROQUEL Take 25 mg by mouth every 4 (four) hours as needed.   senna-docusate 8.6-50 MG tablet Commonly known as:  Senokot-S Take 1 tablet by mouth at bedtime as needed for mild constipation.   thiamine 100 MG tablet Take 1 tablet (100 mg total) by mouth daily. Start taking on:  03/08/2018        DISCHARGE INSTRUCTIONS:   Follow-up with primary care physician in 3 days.  PCP to  consider repeating CMP during the follow-up visit Outpatient alcohol Anonymous  DIET:  Diabetic diet  DISCHARGE CONDITION:  Stable  ACTIVITY:  Activity as tolerated  OXYGEN:  Home Oxygen: No.   Oxygen Delivery: room air  DISCHARGE LOCATION:  home   If you experience worsening of your admission symptoms, develop shortness of breath, life threatening emergency, suicidal or homicidal thoughts you must seek medical attention immediately by calling 911 or calling your MD immediately  if symptoms less severe.  You Must read complete instructions/literature along with all the possible adverse reactions/side effects for all the Medicines you take and that have been prescribed to you. Take any new Medicines after you have completely understood and accpet all the possible adverse reactions/side effects.   Please note  You were cared for by a hospitalist during your hospital stay. If you have any questions about your discharge medications or the care you received while you were in the hospital after you are discharged, you can call the unit and asked to speak with the hospitalist on call if the hospitalist that took care of you is not available. Once you are discharged, your primary care physician will handle any further medical issues. Please note that NO REFILLS for any discharge medications will be authorized once you are discharged, as it is imperative that you return to your primary care physician (or establish a relationship with a primary care physician if you do not have one) for your aftercare needs so that they can reassess your need for medications and monitor your lab values.     Today  Chief Complaint  Patient presents with  . Abdominal Pain    Patient is tolerating advanced diet and denies any abdominal pain and does not want any pain medications to go home.  Promising that she will stop drinking alcohol ROS:  CONSTITUTIONAL: Denies fevers, chills. Denies any fatigue,  weakness.  EYES: Denies blurry vision, double vision, eye pain. EARS, NOSE, THROAT: Denies tinnitus, ear pain, hearing loss. RESPIRATORY: Denies cough, wheeze, shortness of breath.  CARDIOVASCULAR: Denies chest pain, palpitations, edema.  GASTROINTESTINAL: Denies nausea, vomiting, diarrhea, abdominal pain. Denies bright red blood per rectum. GENITOURINARY: Denies dysuria, hematuria. ENDOCRINE: Denies nocturia or thyroid problems. HEMATOLOGIC AND LYMPHATIC: Denies easy bruising or bleeding. SKIN: Denies rash or lesion. MUSCULOSKELETAL: Denies pain in neck, back, shoulder, knees, hips or arthritic symptoms.  NEUROLOGIC: Denies paralysis, paresthesias.  PSYCHIATRIC: Denies anxiety or depressive symptoms.   VITAL SIGNS:  Blood pressure 131/78, pulse 86, temperature 98.2 F (36.8 C), temperature source Oral, resp. rate 16, height '5\' 2"'  (1.575 m), weight 90.3 kg, SpO2 100 %.  I/O:    Intake/Output Summary (Last 24 hours) at 03/07/2018 1050 Last data filed at 03/07/2018 1031 Gross per 24 hour  Intake 3750.97 ml  Output -  Net 3750.97 ml    PHYSICAL EXAMINATION:  GENERAL:  57 y.o.-year-old patient lying in the bed with no acute distress.  EYES: Pupils equal, round, reactive to light  and accommodation. No scleral icterus. Extraocular muscles intact.  HEENT: Head atraumatic, normocephalic. Oropharynx and nasopharynx clear.  NECK:  Supple, no jugular venous distention. No thyroid enlargement, no tenderness.  LUNGS: Normal breath sounds bilaterally, no wheezing, rales,rhonchi or crepitation. No use of accessory muscles of respiration.  CARDIOVASCULAR: S1, S2 normal. No murmurs, rubs, or gallops.  ABDOMEN: Soft, non-tender, non-distended. Bowel sounds present. No organomegaly or mass.  EXTREMITIES: No pedal edema, cyanosis, or clubbing.  NEUROLOGIC: Cranial nerves II through XII are intact. Muscle strength 5/5 in all extremities. Sensation intact. Gait not checked.  PSYCHIATRIC: The patient  is alert and oriented x 3.  SKIN: No obvious rash, lesion, or ulcer.   DATA REVIEW:   CBC Recent Labs  Lab 03/05/18 0608  WBC 4.5  HGB 12.1  HCT 34.5*  PLT 237    Chemistries  Recent Labs  Lab 03/05/18 0608 03/07/18 0320  NA 141 141  K 3.5 3.6  CL 110 109  CO2 25 26  GLUCOSE 149* 151*  BUN 6 <5*  CREATININE 0.58 0.69  CALCIUM 7.9* 8.6*  MG 1.7  --   AST  --  74*  ALT  --  145*  ALKPHOS  --  162*  BILITOT  --  1.8*    Cardiac Enzymes Recent Labs  Lab 03/04/18 1600  TROPONINI <0.03    Microbiology Results  Results for orders placed or performed during the hospital encounter of 02/16/18  Urine culture     Status: Abnormal   Collection Time: 02/16/18 12:00 PM  Result Value Ref Range Status   Specimen Description   Final    URINE, RANDOM Performed at [facility], [address], [place], [place] [address]    Special Requests   Final    NONE Performed at [facility], [address] [place], [place] [address]    Culture MULTIPLE SPECIES PRESENT, SUGGEST RECOLLECTION (A)  Final   Report Status 02/17/2018 FINAL  Final  MRSA PCR Screening     Status: None   Collection Time: 02/16/18  1:34 PM  Result Value Ref Range Status   MRSA by PCR NEGATIVE NEGATIVE Final    Comment:        The GeneXpert MRSA Assay (FDA approved for NASAL specimens only), is one component of a comprehensive MRSA colonization surveillance program. It is not intended to diagnose MRSA infection nor to guide or monitor treatment for MRSA infections. Performed at [facility], [address], [place],  [address]     RADIOLOGY:  Ct Abdomen Pelvis W Contrast  Result Date: 03/04/2018 CLINICAL DATA:  Abdominal pain for 3 days. EXAM: CT ABDOMEN AND PELVIS WITH CONTRAST TECHNIQUE: Multidetector CT imaging of the abdomen and pelvis was performed using the standard protocol following bolus administration of intravenous contrast. CONTRAST:  110m  ISOVUE-300 IOPAMIDOL (ISOVUE-300) INJECTION 61% COMPARISON:  02/17/2018 FINDINGS: Lower chest: Lung bases are clear. No effusions. Heart is normal size. Hepatobiliary: No focal hepatic abnormality. Gallbladder unremarkable. Pancreas: There is haziness noted around the pancreatic head and proximal duodenum. No focal pancreatic abnormality or ductal dilatation. Spleen: No focal abnormality.  Normal size. Adrenals/Urinary Tract: No adrenal abnormality. No focal renal abnormality. No stones or hydronephrosis. Urinary bladder is unremarkable. Stomach/Bowel: No visible duodenal wall thickening. Stomach, large and small bowel grossly unremarkable. Vascular/Lymphatic: Aortic atherosclerosis. No enlarged abdominal or pelvic lymph nodes. Reproductive: Prior hysterectomy.  No adnexal masses. Other: Small amount of free fluid in the right anterior pararenal space. No  free peritoneal fluid or free air. Musculoskeletal: No acute bony abnormality. Predominately fat density mass noted in the right flank/lateral hip region as seen on prior CT, unchanged. IMPRESSION: Haziness noted around the pancreatic head, proximal duodenum, with fluid in the retroperitoneum/anterior pararenal space on the right. Findings concerning for early acute pancreatitis. Recommend correlation with laboratory values. Stable fat containing mass in the subcutaneous soft tissues of the right hip/flank. Electronically Signed   By: Rolm Baptise M.D.   On: 03/04/2018 19:19   US Abdomen Limited Ruq  Result Date: 03/04/2018 CLINICAL DATA:  Abdominal pain for 3 days EXAM: ULTRASOUND ABDOMEN LIMITED RIGHT UPPER QUADRANT COMPARISON:  CT 02/17/2018 FINDINGS: Gallbladder: No gallstones or wall thickening visualized. No sonographic Murphy sign noted by sonographer. Common bile duct: Diameter: Normal caliber, 4 mm. Liver: No focal lesion identified. Within normal limits in parenchymal echogenicity. Portal vein is patent on color Doppler imaging with normal direction  of blood flow towards the liver. IMPRESSION: Fatty infiltration of the liver.  No acute findings. Electronically Signed   By: Rolm Baptise M.D.   On: 03/04/2018 19:42    EKG:   Orders placed or performed during the hospital encounter of 03/04/18  . ED EKG  . ED EKG      Management plans discussed with the patient, family and they are in agreement.  CODE STATUS:     Code Status Orders  (From admission, onward)         Start     Ordered   03/04/18 2204  Full code  Continuous     03/04/18 2203        Code Status History    Date Active Date Inactive Code Status Order ID Comments User Context   02/16/2018 1330 02/18/2018 1412 Full Code [id number]  Gorden Harms, MD Inpatient   11/26/2017 1421 11/27/2017 1812 Full Code [id number]  Dustin Flock, MD Inpatient   10/16/2017 0212 10/17/2017 1427 Full Code [id number]  Fritzi Mandes, MD Inpatient   09/23/2017 2252 09/24/2017 1505 Full Code [id number]  Amelia Jo, MD Inpatient   05/15/2016 1745 05/16/2016 1749 Full Code [id number]  Dustin Flock, MD Inpatient   01/13/2016 0051 01/14/2016 1314 Full Code [id number]  Norval Morton, MD ED   01/08/2016 1916 01/09/2016 1704 Full Code [id number]  Theodoro Grist, MD Inpatient      TOTAL TIME TAKING CARE OF THIS PATIENT: 41mnutes.   Note: This dictation was prepared with Dragon dictation along with smaller phrase technology. Any transcriptional errors that result from this process are unintentional.   '@M[facility]' @  on 03/07/2018 at 10:50 AM  Between 7am to 6pm - Pager - [phone] After 6pm go to [email or web address] - password [email or web address]  Office  [phone] CC: Primary care physician; [facility] [place]

## 2018-03-07 NOTE — Discharge Instructions (Signed)
Acute Pancreatitis Acute pancreatitis is a condition in which the pancreas suddenly becomes irritated and swollen (has inflammation). The pancreas is a gland that is located behind the stomach. It produces enzymes that help to digest food. The pancreas also releases the hormones glucagon and insulin, which help to regulate blood sugar. Damage to the pancreas occurs when the digestive enzymes from the pancreas are activated before they are released into the intestine. Most acute attacks last a couple of days and can cause serious problems. Some people become dehydrated and develop low blood pressure. In severe cases, bleeding into the pancreas can lead to shock and can be life-threatening. The lungs, heart, and kidneys may fail. What are the causes? The most common causes of this condition are:  Alcohol abuse.  Gallstones.  Other causes include:  Certain medicines.  Exposure to certain chemicals.  Infection.  Damage caused by an accident (trauma).  Abdominal surgery.  In some cases, the cause may not be known. What are the signs or symptoms? Symptoms of this condition include:  Pain in the upper abdomen that may radiate to the back.  Tenderness and swelling of the abdomen.  Nausea and vomiting.  How is this diagnosed? This condition may be diagnosed based on:  A physical exam.  Blood tests.  Imaging tests, such as X-rays, CT scans, or an ultrasound of the abdomen.  How is this treated? Treatment for this condition usually requires a stay in the hospital. Treatment may include:  Pain medicine.  Fluid replacement through an IV tube.  Placing a tube in the stomach to remove stomach contents and to control vomiting (NG tube, or nasogastric tube).  Not eating for 3-4 days. This gives the pancreas a rest, because enzymes are not being produced that can cause further damage.  Antibiotic medicines, if your condition is caused by an infection.  Surgery on the pancreas  or gallbladder.  Follow these instructions at home: Eating and drinking  Follow instructions from your health care provider about diet. This may involve avoiding alcohol and decreasing the amount of fat in your diet.  Eat smaller, more frequent meals. This reduces the amount of digestive fluids that the pancreas produces.  Drink enough fluid to keep your urine clear or pale yellow.  Do not drink alcohol if it caused your condition. General instructions  Take over-the-counter and prescription medicines only as told by your health care provider.  Do not use any tobacco products, such as cigarettes, chewing tobacco, and e-cigarettes. If you need help quitting, ask your health care provider.  Get plenty of rest.  If directed, check your blood sugar at home as told by your health care provider.  Keep all follow-up visits as told by your health care provider. This is important. Contact a health care provider if:  You do not recover as quickly as expected.  You develop new or worsening symptoms.  You have persistent pain, weakness, or nausea.  You recover and then have another episode of pain.  You have a fever. Get help right away if:  You cannot eat or keep fluids down.  Your pain becomes severe.  Your skin or the white part of your eyes turns yellow (jaundice).  You vomit.  You feel dizzy or you faint.  Your blood sugar is high (over 300 mg/dL). This information is not intended to replace advice given to you by your health care provider. Make sure you discuss any questions you have with your health care provider. Document  Released: 06/28/2005 Document Revised: 11/05/2015 Document Reviewed: 04/01/2015 Elsevier Interactive Patient Education  2018 [employer] Follow-up with primary care physician in 3 days.  PCP to consider repeating CMP during the follow-up visit Outpatient alcohol Anonymous

## 2018-03-07 NOTE — Progress Notes (Signed)
Discharge instructions reviewed with patient including followup visits and new medications as well as stopping insulin until seen by Dr. Letta PateAycock.  Understanding was verbalized and all questions were answered.  IV removed without complication; patient tolerated well.  Patient discharged home ambulatory in stable condition.

## 2018-03-15 ENCOUNTER — Ambulatory Visit
Admission: RE | Admit: 2018-03-15 | Discharge: 2018-03-15 | Disposition: A | Discharge status: Home / Self Care | Payer: Medicaid Other | Source: Ambulatory Visit | Attending: Family Medicine | Admitting: Family Medicine

## 2018-03-15 DIAGNOSIS — E882 Lipomatosis, not elsewhere classified: Secondary | ICD-10-CM | POA: Insufficient documentation

## 2018-03-15 DIAGNOSIS — R1903 Right lower quadrant abdominal swelling, mass and lump: Secondary | ICD-10-CM

## 2018-03-15 MED ORDER — GADOBENATE DIMEGLUMINE 529 MG/ML IV SOLN
20.0000 mL | Freq: Once | INTRAVENOUS | Status: AC | PRN
  Administered 2018-03-15: 20 mL via INTRAVENOUS

## 2018-12-18 ENCOUNTER — Other Ambulatory Visit: Payer: Self-pay

## 2018-12-18 ENCOUNTER — Emergency Department
Admission: EM | Admit: 2018-12-18 | Discharge: 2018-12-18 | Disposition: A | Discharge status: Home / Self Care | Payer: Medicaid Other | Attending: Emergency Medicine | Admitting: Emergency Medicine

## 2018-12-18 DIAGNOSIS — R739 Hyperglycemia, unspecified: Secondary | ICD-10-CM

## 2018-12-18 DIAGNOSIS — Z79899 Other long term (current) drug therapy: Secondary | ICD-10-CM | POA: Insufficient documentation

## 2018-12-18 DIAGNOSIS — F17210 Nicotine dependence, cigarettes, uncomplicated: Secondary | ICD-10-CM | POA: Insufficient documentation

## 2018-12-18 DIAGNOSIS — Z9119 Patient's noncompliance with other medical treatment and regimen: Secondary | ICD-10-CM | POA: Insufficient documentation

## 2018-12-18 DIAGNOSIS — Z7982 Long term (current) use of aspirin: Secondary | ICD-10-CM | POA: Insufficient documentation

## 2018-12-18 DIAGNOSIS — I10 Essential (primary) hypertension: Secondary | ICD-10-CM | POA: Insufficient documentation

## 2018-12-18 DIAGNOSIS — E860 Dehydration: Secondary | ICD-10-CM

## 2018-12-18 DIAGNOSIS — R531 Weakness: Secondary | ICD-10-CM | POA: Diagnosis present

## 2018-12-18 DIAGNOSIS — E1165 Type 2 diabetes mellitus with hyperglycemia: Secondary | ICD-10-CM | POA: Insufficient documentation

## 2018-12-18 DIAGNOSIS — Z794 Long term (current) use of insulin: Secondary | ICD-10-CM | POA: Insufficient documentation

## 2018-12-18 DIAGNOSIS — J45909 Unspecified asthma, uncomplicated: Secondary | ICD-10-CM | POA: Insufficient documentation

## 2018-12-18 LAB — BASIC METABOLIC PANEL
Anion gap: 13 (ref 5–15)
BUN: 13 mg/dL (ref 6–20)
CO2: 20 mmol/L — ABNORMAL LOW (ref 22–32)
Calcium: 9 mg/dL (ref 8.9–10.3)
Chloride: 100 mmol/L (ref 98–111)
Creatinine, Ser: 0.86 mg/dL (ref 0.44–1.00)
GFR calc Af Amer: >60 mL/min (ref >60)
GFR calc non Af Amer: >60 mL/min (ref >60)
Glucose, Bld: 668 mg/dL (ref 70–99)
Potassium: 3.9 mmol/L (ref 3.5–5.1)
Sodium: 133 mmol/L — ABNORMAL LOW (ref 135–145)

## 2018-12-18 LAB — URINALYSIS, COMPLETE (UACMP) WITH MICROSCOPIC
Bacteria, UA: NONE SEEN
Bilirubin Urine: NEGATIVE
Glucose, UA: >=500 mg/dL — AB
Hgb urine dipstick: NEGATIVE
Ketones, ur: 20 mg/dL — AB
Leukocytes,Ua: NEGATIVE
Nitrite: NEGATIVE
Protein, ur: NEGATIVE mg/dL
Specific Gravity, Urine: 1.029 (ref 1.005–1.030)
pH: 6 (ref 5.0–8.0)

## 2018-12-18 LAB — GLUCOSE, CAPILLARY
Glucose-Capillary: 473 mg/dL — ABNORMAL HIGH (ref 70–99)
Glucose-Capillary: 377 mg/dL — ABNORMAL HIGH (ref 70–99)

## 2018-12-18 LAB — CBC
HCT: 41.3 % (ref 36.0–46.0)
Hemoglobin: 14.6 g/dL (ref 12.0–15.0)
MCH: 30.8 pg (ref 26.0–34.0)
MCHC: 35.4 g/dL (ref 30.0–36.0)
MCV: 87.1 fL (ref 80.0–100.0)
Platelets: 311 10*3/uL (ref 150–400)
RBC: 4.74 MIL/uL (ref 3.87–5.11)
RDW: 11.8 % (ref 11.5–15.5)
WBC: 8.7 10*3/uL (ref 4.0–10.5)
nRBC: 0 % (ref 0.0–0.2)

## 2018-12-18 LAB — BLOOD GAS, VENOUS
Patient temperature: 37
pCO2, Ven: 41 mmHg — ABNORMAL LOW (ref 44.0–60.0)
pH, Ven: 7.42 (ref 7.250–7.430)
pO2, Ven: 33 mmHg (ref 32.0–45.0)

## 2018-12-18 LAB — HEPATIC FUNCTION PANEL
ALT: 14 U/L (ref 0–44)
AST: 11 U/L — ABNORMAL LOW (ref 15–41)
Albumin: 4.1 g/dL (ref 3.5–5.0)
Alkaline Phosphatase: 93 U/L (ref 38–126)
Bilirubin, Direct: <0.1 mg/dL (ref 0.0–0.2)
Total Bilirubin: 0.8 mg/dL (ref 0.3–1.2)
Total Protein: 7.5 g/dL (ref 6.5–8.1)

## 2018-12-18 LAB — LIPASE, BLOOD: Lipase: 26 U/L (ref 11–51)

## 2018-12-18 MED ORDER — SODIUM CHLORIDE 0.9 % IV BOLUS: 1000.0000 mL | Freq: Once | INTRAVENOUS | Status: DC

## 2018-12-18 MED ORDER — INSULIN ASPART 100 UNIT/ML ~~LOC~~ SOLN
10.0000 [IU] | Freq: Once | SUBCUTANEOUS | Status: AC
  Administered 2018-12-18: 10 [IU] via INTRAVENOUS
  Filled 2018-12-18: qty 1

## 2018-12-18 MED ORDER — SODIUM CHLORIDE 0.9 % IV BOLUS
1000.0000 mL | Freq: Once | INTRAVENOUS | Status: AC
  Administered 2018-12-18: 17:00:00 1000 mL via INTRAVENOUS

## 2018-12-18 NOTE — ED Triage Notes (Signed)
Pt arrives to ED from home via [facility] with c/c of nausea and hyperglycemia. Pt states she gave herself 10 units of insulin this morning but cant say what type of insulin she is using. Transport vitals reported as 124/80, p95, O2 sat 96%, temp 97.3, CBG reported as "high". Upon arrival, pt A&Ox4, NAD, no respiratory distress evident.

## 2018-12-18 NOTE — ED Provider Notes (Signed)
[facility] Emergency Department Provider Note  ____________________________________________   First MD Initiated Contact with Patient 12/18/18 1440     (approximate)  I have reviewed the triage vital signs and the nursing notes.   HISTORY  Chief Complaint Hyperglycemia    HPI Pamela Lane is a 58 y.o. female with past medical history as below here with polyuria and polydipsia.  The patient states that over the last several weeks, she has not been taking her insulin.  She states she is had some dry mouth, sore throat, and generalized weakness.  She denies any shortness of breath.  No nausea.  No vomiting.  She is otherwise been taking her medication as prescribed.  She states she felt just so weak today, so she presents for evaluation.  She states she has a history of DKA and feels somewhat better than she did during those times.  No recent fevers or chills.  No dysuria, only polyuria.  No other complaints.        Past Medical History:  Diagnosis Date  . Asthma   . Diabetes mellitus without complication ([place])   . Hypertension   . Mental disorder    pt reports 'I have all of them mental disorders'    Patient Active Problem List   Diagnosis Date Noted  . Acute pancreatitis 05/15/2016  . AKI (acute kidney injury) ([place])   . Diabetic ketoacidosis without coma associated with type 2 diabetes mellitus ([place])   . Hyperglycemia due to type 2 diabetes mellitus ([place]) 01/13/2016  . UTI (urinary tract infection) 01/13/2016  . DKA (diabetic ketoacidoses) ([place]) 01/08/2016  . Hyponatremia 01/08/2016  . Dehydration 01/08/2016  . Urinary tract infection 01/08/2016  . Upper abdominal pain 01/08/2016  . Nausea & vomiting 01/08/2016    Past Surgical History:  Procedure Laterality Date  . ABDOMINAL HYSTERECTOMY      Prior to Admission medications   Medication Sig Start Date End Date Taking? Authorizing Provider  albuterol (PROVENTIL HFA;VENTOLIN HFA) 108  (90 Base) MCG/ACT inhaler Inhale 2 puffs into the lungs every 6 (six) hours as needed for wheezing or shortness of breath. 02/18/18   Dustin Flock, MD  aspirin EC 81 MG tablet Take 81 mg by mouth daily.    [provider]  blood glucose meter kit and supplies KIT Dispense based on patient and insurance preference. Use up to four times daily as directed. (FOR ICD-9 250.00, 250.01). 02/18/18   Dustin Flock, MD  buPROPion [facility] SR) 150 MG 12 hr tablet Take 1 tablet (150 mg total) by mouth 2 (two) times daily. 02/18/18 03/20/18  Dustin Flock, MD  folic acid (FOLVITE) 1 MG tablet Take 1 tablet (1 mg total) by mouth daily. 03/08/18   Gouru, Illene Silver, MD  ipratropium-albuterol (DUONEB) 0.5-2.5 (3) MG/3ML SOLN Take 3 mLs by nebulization every 6 (six) hours as needed for wheezing, shortness of breath or cough. 02/23/18   [provider]  lisinopril (PRINIVIL) 10 MG tablet Take 1 tablet (10 mg total) by mouth daily. 02/18/18   Dustin Flock, MD  metFORMIN (GLUCOPHAGE) 500 MG tablet Take 1 tablet (500 mg total) by mouth 2 (two) times daily with a meal. 02/18/18   Dustin Flock, MD  Multiple Vitamin (MULTIVITAMIN WITH MINERALS) TABS tablet Take 1 tablet by mouth daily. 03/08/18   Gouru, Illene Silver, MD  QUEtiapine (SEROQUEL) 200 MG tablet Take 200 mg by mouth at bedtime. 03/02/18   [provider]  QUEtiapine (SEROQUEL) 25 MG tablet Take 25  mg by mouth every 4 (four) hours as needed. 03/02/18   [provider]  senna-docusate (SENOKOT-S) 8.6-50 MG tablet Take 1 tablet by mouth at bedtime as needed for mild constipation. 03/07/18   Nicholes Mango, MD  thiamine 100 MG tablet Take 1 tablet (100 mg total) by mouth daily. 03/08/18   Nicholes Mango, MD    Allergies Patient has no known allergies.  Family History  Problem Relation Age of Onset  . Hypertension Mother     Social History Social History   Tobacco Use  . Smoking status: Current Every Day Smoker    Packs/day: 0.10     Types: Cigarettes  . Smokeless tobacco: Never Used  Substance Use Topics  . Alcohol use: Yes    Alcohol/week: 40.0 standard drinks    Types: 40 Cans of beer per week    Comment: 3 big bottles of liquor  . Drug use: Yes    Types: Marijuana, Cocaine    Review of Systems Review of Systems  Constitutional: Positive for fatigue. Negative for chills and fever.  HENT: Negative for congestion and rhinorrhea.   Eyes: Negative for visual disturbance.  Respiratory: Negative for cough and shortness of breath.   Cardiovascular: Negative for chest pain.  Gastrointestinal: Positive for nausea. Negative for vomiting.  Endocrine: Positive for polydipsia and polyuria.  Musculoskeletal: Negative for neck pain.  Skin: Negative for rash and wound.  Allergic/Immunologic: Negative for immunocompromised state.  Neurological: Positive for weakness. Negative for numbness.  Hematological: Does not bruise/bleed easily.  All other systems reviewed and are negative.    ____________________________________________  PHYSICAL EXAM:  VITAL SIGNS: ED Triage Vitals  Enc Vitals Group     BP 12/18/18 1437 132/80     Pulse Rate 12/18/18 1437 95     Resp 12/18/18 1437 16     Temp 12/18/18 1437 97.7 F (36.5 C)     Temp Source 12/18/18 1437 Oral     SpO2 12/18/18 1437 94 %     Weight 12/18/18 1440 208 lb 8.9 oz (94.6 kg)     Height 12/18/18 1440 _0  (1.6 m)     Head Circumference --      Peak Flow --      Pain Score 12/18/18 1439 4     Pain Loc --      Pain Edu? --      Excl. in [place]? --     Physical Exam Vitals signs and nursing note reviewed.  Constitutional:      General: She is not in acute distress.    Appearance: She is well-developed.  HENT:     Head: Normocephalic and atraumatic.     Comments: Moderately dry mucous membranes Eyes:     Conjunctiva/sclera: Conjunctivae normal.  Neck:     Musculoskeletal: Neck supple.  Cardiovascular:     Rate and Rhythm: Regular rhythm. Tachycardia  present.     Heart sounds: Normal heart sounds. No murmur. No friction rub.     Comments: Mild tachycardia when sitting upright Pulmonary:     Effort: Pulmonary effort is normal. No respiratory distress.     Breath sounds: Normal breath sounds. No wheezing or rales.  Abdominal:     General: There is no distension.     Palpations: Abdomen is soft.     Tenderness: There is no abdominal tenderness.  Skin:    General: Skin is warm.     Capillary Refill: Capillary refill takes less than 2 seconds.  Neurological:  Mental Status: She is alert and oriented to person, place, and time.     Motor: No abnormal muscle tone.       ____________________________________________   LABS (all labs ordered are listed, but only abnormal results are displayed)  Labs Reviewed  BASIC METABOLIC PANEL - Abnormal; Notable for the following components:      Result Value   Sodium 133 (*)    CO2 20 (*)    Glucose, Bld 668 (*)    All other components within normal limits  URINALYSIS, COMPLETE (UACMP) WITH MICROSCOPIC - Abnormal; Notable for the following components:   Color, Urine STRAW (*)    APPearance CLEAR (*)    Glucose, UA >=500 (*)    Ketones, ur 20 (*)    All other components within normal limits  GLUCOSE, CAPILLARY - Abnormal; Notable for the following components:   Glucose-Capillary 473 (*)    All other components within normal limits  BLOOD GAS, VENOUS - Abnormal; Notable for the following components:   pCO2, Ven 41 (*)    All other components within normal limits  HEPATIC FUNCTION PANEL - Abnormal; Notable for the following components:   AST 11 (*)    All other components within normal limits  GLUCOSE, CAPILLARY - Abnormal; Notable for the following components:   Glucose-Capillary 377 (*)    All other components within normal limits  CBC  LIPASE, BLOOD  CBG MONITORING, ED  CBG MONITORING, ED    ____________________________________________  RADIOLOGY All imaging, including  plain films, CT scans, and ultrasounds, independently reviewed by me, and interpretations confirmed via formal radiology reads.  ED MD interpretation: None  Official radiology report(s): No results found.  ____________________________________________  PROCEDURES   Procedure(s) performed (including Critical Care):  Procedures  ____________________________________________  INITIAL IMPRESSION / MDM / [place] / ED COURSE  As part of my medical decision making, I reviewed the following data within the electronic MEDICAL RECORD NUMBER Notes from prior ED visits and [place] Controlled Substance Database      *Pamela Lane was evaluated in Emergency Department on 12/18/2018 for the symptoms described in the history of present illness. She was evaluated in the context of the global COVID-19 pandemic, which necessitated consideration that the patient might be at risk for infection with the SARS-CoV-2 virus that causes COVID-19. Institutional protocols and algorithms that pertain to the evaluation of patients at risk for COVID-19 are in a state of rapid change based on information released by regulatory bodies including the CDC and federal and state organizations. These policies and algorithms were followed during the patient's care in the ED.  Some ED evaluations and interventions may be delayed as a result of limited staffing during the pandemic.*      Medical Decision Making: 58 year old female here with hyperglycemia and generalized weakness.  No infectious symptoms.  Lab work today shows significant hyperglycemia but normal anion gap.  Her pH is normal.  LFTs and lipase are normal.  Renal function is at baseline.  Patient has been given IVF, glu now <400 and she feels better, requesting to go home. She's tolerating PO without difficulty. Will d/c with instructions to call her PCP in AM. Return precautions given. Importance of med adherence reiterated.  ____________________________________________  FINAL CLINICAL IMPRESSION(S) / ED DIAGNOSES  Final diagnoses:  Hyperglycemia  Dehydration     MEDICATIONS GIVEN DURING THIS VISIT:  Medications  sodium chloride 0.9 % bolus 1,000 mL (has no administration in time range)  sodium chloride  0.9 % bolus 1,000 mL (0 mLs Intravenous Stopped 12/18/18 1859)  insulin aspart (novoLOG) injection 10 Units (10 Units Intravenous Given 12/18/18 1625)     ED Discharge Orders    None       Note:  This document was prepared using Dragon voice recognition software and may include unintentional dictation errors.   Duffy Bruce, MD 12/18/18 [phone]

## 2018-12-18 NOTE — ED Notes (Signed)
Pt given cup of water 

## 2018-12-18 NOTE — ED Notes (Signed)
HOB adjusted for pt; pt given cup of water per request.

## 2018-12-18 NOTE — ED Notes (Addendum)
Pt denies any needs. Pt is watching tv and resting in bed.

## 2018-12-26 ENCOUNTER — Inpatient Hospital Stay
Admission: EM | Admit: 2018-12-26 | Discharge: 2018-12-31 | DRG: 637 | Disposition: A | Discharge status: Home Health | Payer: Medicaid Other | Attending: Internal Medicine | Admitting: Internal Medicine

## 2018-12-26 ENCOUNTER — Other Ambulatory Visit: Payer: Self-pay

## 2018-12-26 ENCOUNTER — Emergency Department: Payer: Medicaid Other

## 2018-12-26 ENCOUNTER — Encounter: Payer: Self-pay | Admitting: Emergency Medicine

## 2018-12-26 DIAGNOSIS — E860 Dehydration: Secondary | ICD-10-CM | POA: Diagnosis present

## 2018-12-26 DIAGNOSIS — Z7982 Long term (current) use of aspirin: Secondary | ICD-10-CM

## 2018-12-26 DIAGNOSIS — F17210 Nicotine dependence, cigarettes, uncomplicated: Secondary | ICD-10-CM | POA: Diagnosis present

## 2018-12-26 DIAGNOSIS — B964 Proteus (mirabilis) (morganii) as the cause of diseases classified elsewhere: Secondary | ICD-10-CM | POA: Diagnosis present

## 2018-12-26 DIAGNOSIS — E876 Hypokalemia: Secondary | ICD-10-CM | POA: Diagnosis not present

## 2018-12-26 DIAGNOSIS — F10239 Alcohol dependence with withdrawal, unspecified: Secondary | ICD-10-CM | POA: Diagnosis not present

## 2018-12-26 DIAGNOSIS — G92 Toxic encephalopathy: Secondary | ICD-10-CM | POA: Diagnosis present

## 2018-12-26 DIAGNOSIS — K859 Acute pancreatitis without necrosis or infection, unspecified: Secondary | ICD-10-CM | POA: Diagnosis present

## 2018-12-26 DIAGNOSIS — J9601 Acute respiratory failure with hypoxia: Secondary | ICD-10-CM | POA: Diagnosis not present

## 2018-12-26 DIAGNOSIS — F1410 Cocaine abuse, uncomplicated: Secondary | ICD-10-CM | POA: Diagnosis present

## 2018-12-26 DIAGNOSIS — N390 Urinary tract infection, site not specified: Secondary | ICD-10-CM | POA: Diagnosis present

## 2018-12-26 DIAGNOSIS — N179 Acute kidney failure, unspecified: Secondary | ICD-10-CM | POA: Diagnosis present

## 2018-12-26 DIAGNOSIS — E875 Hyperkalemia: Secondary | ICD-10-CM | POA: Diagnosis present

## 2018-12-26 DIAGNOSIS — K852 Alcohol induced acute pancreatitis without necrosis or infection: Secondary | ICD-10-CM | POA: Diagnosis not present

## 2018-12-26 DIAGNOSIS — F10230 Alcohol dependence with withdrawal, uncomplicated: Secondary | ICD-10-CM | POA: Diagnosis not present

## 2018-12-26 DIAGNOSIS — Z01818 Encounter for other preprocedural examination: Secondary | ICD-10-CM | POA: Diagnosis not present

## 2018-12-26 DIAGNOSIS — E870 Hyperosmolality and hypernatremia: Secondary | ICD-10-CM | POA: Diagnosis not present

## 2018-12-26 DIAGNOSIS — Z4659 Encounter for fitting and adjustment of other gastrointestinal appliance and device: Secondary | ICD-10-CM

## 2018-12-26 DIAGNOSIS — Z1159 Encounter for screening for other viral diseases: Secondary | ICD-10-CM

## 2018-12-26 DIAGNOSIS — F329 Major depressive disorder, single episode, unspecified: Secondary | ICD-10-CM | POA: Diagnosis present

## 2018-12-26 DIAGNOSIS — E878 Other disorders of electrolyte and fluid balance, not elsewhere classified: Secondary | ICD-10-CM | POA: Diagnosis present

## 2018-12-26 DIAGNOSIS — E873 Alkalosis: Secondary | ICD-10-CM | POA: Diagnosis present

## 2018-12-26 DIAGNOSIS — E871 Hypo-osmolality and hyponatremia: Secondary | ICD-10-CM | POA: Diagnosis present

## 2018-12-26 DIAGNOSIS — E0810 Diabetes mellitus due to underlying condition with ketoacidosis without coma: Secondary | ICD-10-CM

## 2018-12-26 DIAGNOSIS — I10 Essential (primary) hypertension: Secondary | ICD-10-CM | POA: Diagnosis present

## 2018-12-26 DIAGNOSIS — J9600 Acute respiratory failure, unspecified whether with hypoxia or hypercapnia: Secondary | ICD-10-CM

## 2018-12-26 DIAGNOSIS — Z9911 Dependence on respirator [ventilator] status: Secondary | ICD-10-CM | POA: Diagnosis not present

## 2018-12-26 DIAGNOSIS — G908 Other disorders of autonomic nervous system: Secondary | ICD-10-CM | POA: Diagnosis not present

## 2018-12-26 DIAGNOSIS — Z79899 Other long term (current) drug therapy: Secondary | ICD-10-CM

## 2018-12-26 DIAGNOSIS — T4275XA Adverse effect of unspecified antiepileptic and sedative-hypnotic drugs, initial encounter: Secondary | ICD-10-CM | POA: Diagnosis present

## 2018-12-26 DIAGNOSIS — Y92009 Unspecified place in unspecified non-institutional (private) residence as the place of occurrence of the external cause: Secondary | ICD-10-CM | POA: Diagnosis not present

## 2018-12-26 DIAGNOSIS — I878 Other specified disorders of veins: Secondary | ICD-10-CM | POA: Diagnosis not present

## 2018-12-26 DIAGNOSIS — E1110 Type 2 diabetes mellitus with ketoacidosis without coma: Principal | ICD-10-CM | POA: Diagnosis present

## 2018-12-26 DIAGNOSIS — Z8249 Family history of ischemic heart disease and other diseases of the circulatory system: Secondary | ICD-10-CM

## 2018-12-26 DIAGNOSIS — Z87440 Personal history of urinary (tract) infections: Secondary | ICD-10-CM

## 2018-12-26 DIAGNOSIS — F10231 Alcohol dependence with withdrawal delirium: Secondary | ICD-10-CM | POA: Diagnosis not present

## 2018-12-26 DIAGNOSIS — Z90710 Acquired absence of both cervix and uterus: Secondary | ICD-10-CM

## 2018-12-26 DIAGNOSIS — J45909 Unspecified asthma, uncomplicated: Secondary | ICD-10-CM | POA: Diagnosis present

## 2018-12-26 DIAGNOSIS — Z7984 Long term (current) use of oral hypoglycemic drugs: Secondary | ICD-10-CM | POA: Diagnosis not present

## 2018-12-26 DIAGNOSIS — N10 Acute tubulo-interstitial nephritis: Secondary | ICD-10-CM | POA: Diagnosis not present

## 2018-12-26 LAB — URINALYSIS, COMPLETE (UACMP) WITH MICROSCOPIC
Bacteria, UA: NONE SEEN
Bilirubin Urine: NEGATIVE
Glucose, UA: >=500 mg/dL — AB
Ketones, ur: 80 mg/dL — AB
Leukocytes,Ua: NEGATIVE
Nitrite: NEGATIVE
Protein, ur: 30 mg/dL — AB
Specific Gravity, Urine: 1.021 (ref 1.005–1.030)
pH: 5 (ref 5.0–8.0)

## 2018-12-26 LAB — BASIC METABOLIC PANEL
Anion gap: 20 — ABNORMAL HIGH (ref 5–15)
Anion gap: 11 (ref 5–15)
Anion gap: 12 (ref 5–15)
Anion gap: 14 (ref 5–15)
BUN: 40 mg/dL — ABNORMAL HIGH (ref 6–20)
BUN: 38 mg/dL — ABNORMAL HIGH (ref 6–20)
BUN: 37 mg/dL — ABNORMAL HIGH (ref 6–20)
BUN: 41 mg/dL — ABNORMAL HIGH (ref 6–20)
CO2: 9 mmol/L — ABNORMAL LOW (ref 22–32)
CO2: 15 mmol/L — ABNORMAL LOW (ref 22–32)
CO2: 16 mmol/L — ABNORMAL LOW (ref 22–32)
CO2: 16 mmol/L — ABNORMAL LOW (ref 22–32)
Calcium: 9.5 mg/dL (ref 8.9–10.3)
Calcium: 9.3 mg/dL (ref 8.9–10.3)
Calcium: 9.2 mg/dL (ref 8.9–10.3)
Calcium: 9.1 mg/dL (ref 8.9–10.3)
Chloride: 108 mmol/L (ref 98–111)
Chloride: 117 mmol/L — ABNORMAL HIGH (ref 98–111)
Chloride: 115 mmol/L — ABNORMAL HIGH (ref 98–111)
Chloride: 113 mmol/L — ABNORMAL HIGH (ref 98–111)
Creatinine, Ser: 2.39 mg/dL — ABNORMAL HIGH (ref 0.44–1.00)
Creatinine, Ser: 1.54 mg/dL — ABNORMAL HIGH (ref 0.44–1.00)
Creatinine, Ser: 1.77 mg/dL — ABNORMAL HIGH (ref 0.44–1.00)
Creatinine, Ser: 2.04 mg/dL — ABNORMAL HIGH (ref 0.44–1.00)
GFR calc Af Amer: 25 mL/min — ABNORMAL LOW (ref >60)
GFR calc Af Amer: 43 mL/min — ABNORMAL LOW (ref >60)
GFR calc Af Amer: 36 mL/min — ABNORMAL LOW (ref >60)
GFR calc Af Amer: 31 mL/min — ABNORMAL LOW (ref >60)
GFR calc non Af Amer: 22 mL/min — ABNORMAL LOW (ref >60)
GFR calc non Af Amer: 37 mL/min — ABNORMAL LOW (ref >60)
GFR calc non Af Amer: 31 mL/min — ABNORMAL LOW (ref >60)
GFR calc non Af Amer: 26 mL/min — ABNORMAL LOW (ref >60)
Glucose, Bld: 585 mg/dL (ref 70–99)
Glucose, Bld: 123 mg/dL — ABNORMAL HIGH (ref 70–99)
Glucose, Bld: 120 mg/dL — ABNORMAL HIGH (ref 70–99)
Glucose, Bld: 153 mg/dL — ABNORMAL HIGH (ref 70–99)
Potassium: 4 mmol/L (ref 3.5–5.1)
Potassium: 3.4 mmol/L — ABNORMAL LOW (ref 3.5–5.1)
Potassium: 4 mmol/L (ref 3.5–5.1)
Potassium: 4.3 mmol/L (ref 3.5–5.1)
Sodium: 137 mmol/L (ref 135–145)
Sodium: 143 mmol/L (ref 135–145)
Sodium: 143 mmol/L (ref 135–145)
Sodium: 143 mmol/L (ref 135–145)

## 2018-12-26 LAB — GLUCOSE, CAPILLARY
Glucose-Capillary: 106 mg/dL — ABNORMAL HIGH (ref 70–99)
Glucose-Capillary: 141 mg/dL — ABNORMAL HIGH (ref 70–99)
Glucose-Capillary: 168 mg/dL — ABNORMAL HIGH (ref 70–99)
Glucose-Capillary: 171 mg/dL — ABNORMAL HIGH (ref 70–99)
Glucose-Capillary: 217 mg/dL — ABNORMAL HIGH (ref 70–99)
Glucose-Capillary: 226 mg/dL — ABNORMAL HIGH (ref 70–99)
Glucose-Capillary: 236 mg/dL — ABNORMAL HIGH (ref 70–99)
Glucose-Capillary: 281 mg/dL — ABNORMAL HIGH (ref 70–99)
Glucose-Capillary: 300 mg/dL — ABNORMAL HIGH (ref 70–99)
Glucose-Capillary: 333 mg/dL — ABNORMAL HIGH (ref 70–99)
Glucose-Capillary: 445 mg/dL — ABNORMAL HIGH (ref 70–99)
Glucose-Capillary: 567 mg/dL (ref 70–99)
Glucose-Capillary: 70 mg/dL (ref 70–99)
Glucose-Capillary: 75 mg/dL (ref 70–99)
Glucose-Capillary: 93 mg/dL (ref 70–99)
Glucose-Capillary: >600 mg/dL (ref 70–99)
Glucose-Capillary: >600 mg/dL (ref 70–99)
Glucose-Capillary: >600 mg/dL (ref 70–99)
Glucose-Capillary: >600 mg/dL (ref 70–99)

## 2018-12-26 LAB — BLOOD GAS, VENOUS
Acid-base deficit: 22.5 mmol/L — ABNORMAL HIGH (ref 0.0–2.0)
Bicarbonate: 7.6 mmol/L — ABNORMAL LOW (ref 20.0–28.0)
O2 Saturation: 40.3 %
Patient temperature: 37
pCO2, Ven: 30 mmHg — ABNORMAL LOW (ref 44.0–60.0)
pH, Ven: 7.01 — CL (ref 7.250–7.430)
pO2, Ven: 37 mmHg (ref 32.0–45.0)

## 2018-12-26 LAB — COMPREHENSIVE METABOLIC PANEL
ALT: 12 U/L (ref 0–44)
AST: 14 U/L — ABNORMAL LOW (ref 15–41)
Albumin: 4.1 g/dL (ref 3.5–5.0)
Alkaline Phosphatase: 108 U/L (ref 38–126)
BUN: 46 mg/dL — ABNORMAL HIGH (ref 6–20)
CO2: <7 mmol/L — ABNORMAL LOW (ref 22–32)
Calcium: 9.7 mg/dL (ref 8.9–10.3)
Chloride: 91 mmol/L — ABNORMAL LOW (ref 98–111)
Creatinine, Ser: 2.74 mg/dL — ABNORMAL HIGH (ref 0.44–1.00)
GFR calc Af Amer: 21 mL/min — ABNORMAL LOW (ref >60)
GFR calc non Af Amer: 18 mL/min — ABNORMAL LOW (ref >60)
Glucose, Bld: 1081 mg/dL (ref 70–99)
Potassium: 5.7 mmol/L — ABNORMAL HIGH (ref 3.5–5.1)
Sodium: 128 mmol/L — ABNORMAL LOW (ref 135–145)
Total Bilirubin: 1.4 mg/dL — ABNORMAL HIGH (ref 0.3–1.2)
Total Protein: 7.7 g/dL (ref 6.5–8.1)

## 2018-12-26 LAB — HEMOGLOBIN A1C
Hgb A1c MFr Bld: 14.2 % — ABNORMAL HIGH (ref 4.8–5.6)
Mean Plasma Glucose: 360.84 mg/dL

## 2018-12-26 LAB — CBC
HCT: 48.3 % — ABNORMAL HIGH (ref 36.0–46.0)
Hemoglobin: 15.5 g/dL — ABNORMAL HIGH (ref 12.0–15.0)
MCH: 30.4 pg (ref 26.0–34.0)
MCHC: 32.1 g/dL (ref 30.0–36.0)
MCV: 94.7 fL (ref 80.0–100.0)
Platelets: 321 10*3/uL (ref 150–400)
RBC: 5.1 MIL/uL (ref 3.87–5.11)
RDW: 12.7 % (ref 11.5–15.5)
WBC: 21.6 10*3/uL — ABNORMAL HIGH (ref 4.0–10.5)
nRBC: 0 % (ref 0.0–0.2)

## 2018-12-26 LAB — URINE DRUG SCREEN, QUALITATIVE (ARMC ONLY)
Amphetamines, Ur Screen: NOT DETECTED
Barbiturates, Ur Screen: NOT DETECTED
Benzodiazepine, Ur Scrn: NOT DETECTED
Cannabinoid 50 Ng, Ur ~~LOC~~: NOT DETECTED
Cocaine Metabolite,Ur ~~LOC~~: NOT DETECTED
MDMA (Ecstasy)Ur Screen: NOT DETECTED
Methadone Scn, Ur: NOT DETECTED
Opiate, Ur Screen: POSITIVE — AB
Phencyclidine (PCP) Ur S: NOT DETECTED
Tricyclic, Ur Screen: NOT DETECTED

## 2018-12-26 LAB — MAGNESIUM: Magnesium: 2.7 mg/dL — ABNORMAL HIGH (ref 1.7–2.4)

## 2018-12-26 LAB — PROCALCITONIN: Procalcitonin: 0.57 ng/mL

## 2018-12-26 LAB — LIPASE, BLOOD: Lipase: 1120 U/L — ABNORMAL HIGH (ref 11–51)

## 2018-12-26 LAB — ETHANOL: Alcohol, Ethyl (B): <10 mg/dL (ref <10)

## 2018-12-26 LAB — TROPONIN I: Troponin I: 0.04 ng/mL (ref <0.03)

## 2018-12-26 LAB — PHOSPHORUS: Phosphorus: 3 mg/dL (ref 2.5–4.6)

## 2018-12-26 LAB — AMYLASE: Amylase: 2042 U/L — ABNORMAL HIGH (ref 28–100)

## 2018-12-26 LAB — POC URINE PREG, ED: Preg Test, Ur: NEGATIVE

## 2018-12-26 LAB — MRSA PCR SCREENING: MRSA by PCR: NEGATIVE

## 2018-12-26 MED ORDER — IOHEXOL 300 MG/ML  SOLN
100.0000 mL | Freq: Once | INTRAMUSCULAR | Status: AC | PRN
  Administered 2018-12-26: 100 mL via INTRAVENOUS

## 2018-12-26 MED ORDER — ALBUTEROL SULFATE (2.5 MG/3ML) 0.083% IN NEBU: 2.5000 mg | INHALATION_SOLUTION | RESPIRATORY_TRACT | Status: DC | PRN

## 2018-12-26 MED ORDER — MORPHINE SULFATE (PF) 4 MG/ML IV SOLN
4.0000 mg | Freq: Once | INTRAVENOUS | Status: AC
  Administered 2018-12-26: 4 mg via INTRAVENOUS
  Filled 2018-12-26: qty 1

## 2018-12-26 MED ORDER — SODIUM CHLORIDE 0.9 % IV BOLUS: 1000.0000 mL | Freq: Once | INTRAVENOUS | Status: DC

## 2018-12-26 MED ORDER — SODIUM CHLORIDE 0.9 % IV SOLN
INTRAVENOUS | Status: DC
  Administered 2018-12-26: 04:00:00 via INTRAVENOUS

## 2018-12-26 MED ORDER — INSULIN GLARGINE 100 UNIT/ML ~~LOC~~ SOLN
14.0000 [IU] | Freq: Every day | SUBCUTANEOUS | Status: DC
  Administered 2018-12-26: 14 [IU] via SUBCUTANEOUS
  Filled 2018-12-26 (×2): qty 0.14

## 2018-12-26 MED ORDER — INSULIN REGULAR(HUMAN) IN NACL 100-0.9 UT/100ML-% IV SOLN
INTRAVENOUS | Status: DC
  Administered 2018-12-26: 5.4 [IU]/h via INTRAVENOUS
  Filled 2018-12-26 (×3): qty 100

## 2018-12-26 MED ORDER — ENOXAPARIN SODIUM 30 MG/0.3ML ~~LOC~~ SOLN: 30.0000 mg | SUBCUTANEOUS | Status: DC

## 2018-12-26 MED ORDER — VITAMIN B-1 100 MG PO TABS: 100.0000 mg | ORAL_TABLET | Freq: Every day | ORAL | Status: DC

## 2018-12-26 MED ORDER — FOLIC ACID 1 MG PO TABS: 1.0000 mg | ORAL_TABLET | Freq: Every day | ORAL | Status: DC

## 2018-12-26 MED ORDER — DEXMEDETOMIDINE HCL IN NACL 400 MCG/100ML IV SOLN
0.4000 ug/kg/h | INTRAVENOUS | Status: DC
  Administered 2018-12-26: 1 ug/kg/h via INTRAVENOUS
  Administered 2018-12-26: 0.4 ug/kg/h via INTRAVENOUS
  Filled 2018-12-26 (×2): qty 100

## 2018-12-26 MED ORDER — DEXTROSE-NACL 5-0.45 % IV SOLN
INTRAVENOUS | Status: DC
  Administered 2018-12-26: 12:00:00 via INTRAVENOUS

## 2018-12-26 MED ORDER — INSULIN ASPART 100 UNIT/ML ~~LOC~~ SOLN: 0.0000 [IU] | Freq: Three times a day (TID) | SUBCUTANEOUS | Status: DC

## 2018-12-26 MED ORDER — ADULT MULTIVITAMIN W/MINERALS CH: 1.0000 | ORAL_TABLET | Freq: Every day | ORAL | Status: DC

## 2018-12-26 MED ORDER — SODIUM CHLORIDE 0.9 % IV SOLN
0.0000 ug/min | INTRAVENOUS | Status: DC
  Filled 2018-12-26: qty 1

## 2018-12-26 MED ORDER — POTASSIUM CHLORIDE 10 MEQ/100ML IV SOLN
10.0000 meq | INTRAVENOUS | Status: AC
  Filled 2018-12-26 (×2): qty 100

## 2018-12-26 MED ORDER — INSULIN GLARGINE 100 UNIT/ML ~~LOC~~ SOLN
14.0000 [IU] | SUBCUTANEOUS | Status: DC
  Filled 2018-12-26 (×2): qty 0.14

## 2018-12-26 MED ORDER — LORAZEPAM 2 MG/ML IJ SOLN
2.0000 mg | INTRAMUSCULAR | Status: DC | PRN
  Administered 2018-12-26 – 2018-12-27 (×5): 2 mg via INTRAVENOUS
  Filled 2018-12-26 (×5): qty 1

## 2018-12-26 MED ORDER — CHLORHEXIDINE GLUCONATE CLOTH 2 % EX PADS
6.0000 | MEDICATED_PAD | Freq: Every day | CUTANEOUS | Status: DC
  Administered 2018-12-26 – 2018-12-29 (×4): 6 via TOPICAL

## 2018-12-26 MED ORDER — LACTATED RINGERS IV SOLN
INTRAVENOUS | Status: DC
  Administered 2018-12-26: 23:00:00 via INTRAVENOUS

## 2018-12-26 MED ORDER — INSULIN ASPART 100 UNIT/ML ~~LOC~~ SOLN: 4.0000 [IU] | Freq: Three times a day (TID) | SUBCUTANEOUS | Status: DC

## 2018-12-26 MED ORDER — INSULIN ASPART 100 UNIT/ML ~~LOC~~ SOLN
0.0000 [IU] | SUBCUTANEOUS | Status: DC
  Administered 2018-12-27: 5 [IU] via SUBCUTANEOUS
  Administered 2018-12-27: 8 [IU] via SUBCUTANEOUS
  Administered 2018-12-27: 11 [IU] via SUBCUTANEOUS
  Filled 2018-12-26 (×3): qty 1

## 2018-12-26 MED ORDER — MORPHINE SULFATE (PF) 2 MG/ML IV SOLN
1.0000 mg | INTRAVENOUS | Status: DC | PRN
  Administered 2018-12-27: 2 mg via INTRAVENOUS
  Filled 2018-12-26: qty 1

## 2018-12-26 MED ORDER — INSULIN ASPART 100 UNIT/ML ~~LOC~~ SOLN: 0.0000 [IU] | Freq: Every day | SUBCUTANEOUS | Status: DC

## 2018-12-26 MED ORDER — LACTATED RINGERS IV BOLUS
500.0000 mL | Freq: Once | INTRAVENOUS | Status: AC
  Administered 2018-12-26: 500 mL via INTRAVENOUS

## 2018-12-26 MED ORDER — ENOXAPARIN SODIUM 40 MG/0.4ML ~~LOC~~ SOLN: 40.0000 mg | SUBCUTANEOUS | Status: DC

## 2018-12-26 NOTE — Consult Note (Signed)
Name: Pamela Lane MRN: [id number] DOB: [birth year]    ADMISSION DATE:  12/26/2018 CONSULTATION DATE: 12/26/2018  REFERRING MD : Dr. Marcille Blanco   CHIEF COMPLAINT: Hyperglycemia   BRIEF PATIENT DESCRIPTION:  58 yo female admitted with DKA requiring insulin gtt   SIGNIFICANT EVENTS/STUDIES:  06/16-Pt admitted to the stepdown unit on insulin gtt   HISTORY OF PRESENT ILLNESS:   This is a 58 yo female with a PMH of HTN, Type II Diabetes Mellitus, Asthma, Acute Pancreatitis,  ETOH Abuse, Tobacco Abuse, Cocaine Abuse, and Chronic Mental Illness.  She presented to [facility] ER on 06/16 via EMS with hyperglycemia and abdominal pain.  Per ER notes EMS reported pts glucose reading read "high."  Lab results revealed Na+ 128, K+ 5.7, chloride 91, CO2 <7, glucose 1,081, BUN 46, creatinine 2.74, wbc 21.6, hgb 15.5, UA revealed ketones, urine drug screen positive for opiates, alcohol level <10,and vbg pH 7.01/pCO2 30.  Pt ruled in for DKA therefore insulin gtt initiated and pt received 2L NS bolus.  She was subsequently admitted to the stepdown unit by hospitalist team for additional workup and treatment.    PAST MEDICAL HISTORY :   has a past medical history of Asthma, Diabetes mellitus without complication ([place]), Hypertension, and Mental disorder.  has a past surgical history that includes Abdominal hysterectomy. Prior to Admission medications   Medication Sig Start Date End Date Taking? Authorizing Provider  albuterol (PROVENTIL HFA;VENTOLIN HFA) 108 (90 Base) MCG/ACT inhaler Inhale 2 puffs into the lungs every 6 (six) hours as needed for wheezing or shortness of breath. 02/18/18   Dustin Flock, MD  aspirin EC 81 MG tablet Take 81 mg by mouth daily.    [provider]  blood glucose meter kit and supplies KIT Dispense based on patient and insurance preference. Use up to four times daily as directed. (FOR ICD-9 250.00, 250.01). 02/18/18   Dustin Flock, MD  buPROPion [facility] SR) 150 MG 12  hr tablet Take 1 tablet (150 mg total) by mouth 2 (two) times daily. 02/18/18 03/20/18  Dustin Flock, MD  folic acid (FOLVITE) 1 MG tablet Take 1 tablet (1 mg total) by mouth daily. 03/08/18   Gouru, Illene Silver, MD  ipratropium-albuterol (DUONEB) 0.5-2.5 (3) MG/3ML SOLN Take 3 mLs by nebulization every 6 (six) hours as needed for wheezing, shortness of breath or cough. 02/23/18   [provider]  lisinopril (PRINIVIL) 10 MG tablet Take 1 tablet (10 mg total) by mouth daily. 02/18/18   Dustin Flock, MD  metFORMIN (GLUCOPHAGE) 500 MG tablet Take 1 tablet (500 mg total) by mouth 2 (two) times daily with a meal. 02/18/18   Dustin Flock, MD  Multiple Vitamin (MULTIVITAMIN WITH MINERALS) TABS tablet Take 1 tablet by mouth daily. 03/08/18   Gouru, Illene Silver, MD  QUEtiapine (SEROQUEL) 200 MG tablet Take 200 mg by mouth at bedtime. 03/02/18   [provider]  QUEtiapine (SEROQUEL) 25 MG tablet Take 25 mg by mouth every 4 (four) hours as needed. 03/02/18   [provider]  senna-docusate (SENOKOT-S) 8.6-50 MG tablet Take 1 tablet by mouth at bedtime as needed for mild constipation. 03/07/18   Nicholes Mango, MD  thiamine 100 MG tablet Take 1 tablet (100 mg total) by mouth daily. 03/08/18   Nicholes Mango, MD   No Known Allergies  FAMILY HISTORY:  family history includes Hypertension in her mother. SOCIAL HISTORY:  reports that she has been smoking cigarettes. She has been smoking about 0.10 packs per day. She has  never used smokeless tobacco. She reports current alcohol use of about 40.0 standard drinks of alcohol per week. She reports current drug use. Drugs: Marijuana and Cocaine.  REVIEW OF SYSTEMS: Positives in BOLD  Constitutional: Negative for fever, chills, weight loss, malaise/fatigue and diaphoresis.  HENT: Negative for hearing loss, ear pain, nosebleeds, congestion, sore throat, neck pain, tinnitus and ear discharge.   Eyes: Negative for blurred vision, double vision, photophobia,  pain, discharge and redness.  Respiratory: Negative for cough, hemoptysis, sputum production, shortness of breath, wheezing and stridor.   Cardiovascular: Negative for chest pain, palpitations, orthopnea, claudication, leg swelling and PND.  Gastrointestinal: heartburn, nausea, vomiting, abdominal pain, diarrhea, constipation, blood in stool and melena.  Genitourinary: Negative for dysuria, urgency, frequency, hematuria and flank pain.  Musculoskeletal: Negative for myalgias, back pain, joint pain and falls.  Skin: Negative for itching and rash.  Neurological: Negative for dizziness, tingling, tremors, sensory change, speech change, focal weakness, seizures, loss of consciousness, weakness and headaches.  Endo/Heme/Allergies: hyperglycemia, environmental allergies and polydipsia. Does not bruise/bleed easily.  SUBJECTIVE:  No complaints at this time   VITAL SIGNS: Temp:  [97.4 F (36.3 C)-97.6 F (36.4 C)] 97.6 F (36.4 C) (06/16 0405) Pulse Rate:  [99-112] 100 (06/16 0405) Resp:  [18-27] 18 (06/16 0405) BP: (129-166)/(58-101) 160/65 (06/16 0405) SpO2:  [92 %-100 %] 100 % (06/16 0405) Weight:  [91.3 kg-92 kg] 91.3 kg (06/16 0405)  PHYSICAL EXAMINATION: General: well developed, well nourished female, NAD  Neuro: alert intermittently confused, follows commands  HEENT: supple, no JVD  Cardiovascular: nsr, rrr, no R/G  Lungs: clear throughout, even, non labored  Abdomen: +BS x4, obese, soft, tender  Musculoskeletal: normal bulk and tone, no edema  Skin: intact no rashes or lesions present   Recent Labs  Lab 12/26/18 0128  NA 128*  K 5.7*  CL 91*  CO2 <7*  BUN 46*  CREATININE 2.74*  GLUCOSE 1,081*   Recent Labs  Lab 12/26/18 0128  HGB 15.5*  HCT 48.3*  WBC 21.6*  PLT 321   Ct Abdomen Pelvis W Contrast  Result Date: 12/26/2018 CLINICAL DATA:  58 y/o F; hyperglycemia and abdominal pain. History of diabetes. EXAM: CT ABDOMEN AND PELVIS WITH CONTRAST TECHNIQUE:  Multidetector CT imaging of the abdomen and pelvis was performed using the standard protocol following bolus administration of intravenous contrast. CONTRAST:  19m OMNIPAQUE IOHEXOL 300 MG/ML  SOLN COMPARISON:  03/15/2018 MRI of the abdomen. 03/04/2018 CT abdomen and pelvis. FINDINGS: Lower chest: No acute abnormality. Hepatobiliary: No focal liver abnormality is seen. No gallstones, gallbladder wall thickening, or biliary dilatation. Pancreas: Edema surrounds the pancreas diffusely. No discrete acute peripancreatic collection. No main duct dilatation of the pancreas. Pancreas homogeneously enhances. Spleen: Normal in size without focal abnormality. Adrenals/Urinary Tract: Adrenal glands are unremarkable. Kidneys are normal, without renal calculi, focal lesion, or hydronephrosis. Bladder is unremarkable. Stomach/Bowel: Stomach is within normal limits. Appendix appears normal. No evidence of bowel wall thickening, distention, or inflammatory changes. Vascular/Lymphatic: Aortic atherosclerosis. No enlarged abdominal or pelvic lymph nodes. Reproductive: Status post hysterectomy. No adnexal masses. Other: No abdominal wall hernia or abnormality. No abdominopelvic ascites. Musculoskeletal: No fracture is seen. Interval resection of right flank fatty mass. Moderate loss of height of the L5-S1 intervertebral disc space. IMPRESSION: Extensive edema surrounding the pancreas compatible with acute pancreatitis. No findings of pancreatic necrosis or acute peripancreatic collection at this time. Electronically Signed   By: LKristine GarbeM.D.   On: 12/26/2018 02:46    ASSESSMENT /  PLAN:  Diabetic ketoacidosis  Continue insulin gtt until anion gap closed and serum CO2 >20 CBG q1hr and BMP q4hrs while on insulin gtt  Diabetes coordinator consulted appreciate input   Acute renal failure likely secondary to dehydration in setting of DKA  Pseudohyponatremia secondary to DKA  Hyperkalemia and Hypochloridemia  Trend BMP Replace electrolytes as indicated  Monitor UOP  Avoid nephrotoxic medications    Leukocytosis no obvious signs of infection  Trend WBC and monitor fever curve  Will check pct if elevated will start empiric abx  Follow cultures  COVID-19 results pending   Abdominal pain  Amylase and lipase results pending  Prn morphine for pain management   ETOH Abuse  Current everyday smoker  CIWA protocol initiated-ativan per protocol  Smoking and ETOH abuse cessation counseling provided   VTE px: subq lovenox   Marda Stalker, [place] Pager [phone] (please enter 7 digits) PCCM Consult Pager [phone] (please enter 7 digits)

## 2018-12-26 NOTE — Progress Notes (Signed)
eLink Physician-Brief Progress Note Patient Name: JEZELLE GULLICK DOB: 28-Sep-1960 MRN: [id number]   Date of Service  12/26/2018  HPI/Events of Note  58 y/o F DM, HTN, cocaine abuse, presents with elevate sugar and abdominal pain consistent with DKA. Abdominal CT without significant findings.  eICU Interventions   Fluids and insulin as per DKA protocol.     Intervention Category Major Interventions: Hyperglycemia - active titration of insulin therapy;Acid-Base disturbance - evaluation and management Evaluation Type: New Patient Evaluation  Judd Lien 12/26/2018, 5:30 AM

## 2018-12-26 NOTE — H&P (Signed)
Pamela Lane is an 58 y.o. female.   Chief Complaint: Abdominal pain HPI: The patient with past medical history of diabetes and hypertension presents to the emergency department via EMS due to abdominal pain.  The patient's boyfriend called 911 because he was concerned the patient was not acting right.  She was found to have a blood sugar greater than 1000.  General distress prompted ABG (actually venous) which showed metabolic acidosis.  The patient had other electrolyte abnormalities consistent with diabetic ketoacidosis.  An insulin drip was started in the emergency department staff call the hospitalist service for admission.  Past Medical History:  Diagnosis Date  . Asthma   . Diabetes mellitus without complication ([place])   . Hypertension   . Mental disorder    pt reports 'I have all of them mental disorders'    Past Surgical History:  Procedure Laterality Date  . ABDOMINAL HYSTERECTOMY      Family History  Problem Relation Age of Onset  . Hypertension Mother    Social History:  reports that she has been smoking cigarettes. She has been smoking about 0.10 packs per day. She has never used smokeless tobacco. She reports current alcohol use of about 40.0 standard drinks of alcohol per week. She reports current drug use. Drugs: Marijuana and Cocaine.  Allergies: No Known Allergies  Medications Prior to Admission  Medication Sig Dispense Refill  . albuterol (PROVENTIL HFA;VENTOLIN HFA) 108 (90 Base) MCG/ACT inhaler Inhale 2 puffs into the lungs every 6 (six) hours as needed for wheezing or shortness of breath. 1 Inhaler 2  . aspirin EC 81 MG tablet Take 81 mg by mouth daily.    . blood glucose meter kit and supplies KIT Dispense based on patient and insurance preference. Use up to four times daily as directed. (FOR ICD-9 250.00, 250.01). 1 each 0  . buPROPion (WELLBUTRIN SR) 150 MG 12 hr tablet Take 1 tablet (150 mg total) by mouth 2 (two) times daily. 60 tablet 0  . folic acid  (FOLVITE) 1 MG tablet Take 1 tablet (1 mg total) by mouth daily. 30 tablet 0  . ipratropium-albuterol (DUONEB) 0.5-2.5 (3) MG/3ML SOLN Take 3 mLs by nebulization every 6 (six) hours as needed for wheezing, shortness of breath or cough.  2  . lisinopril (PRINIVIL) 10 MG tablet Take 1 tablet (10 mg total) by mouth daily. 30 tablet 0  . metFORMIN (GLUCOPHAGE) 500 MG tablet Take 1 tablet (500 mg total) by mouth 2 (two) times daily with a meal. 60 tablet 1  . Multiple Vitamin (MULTIVITAMIN WITH MINERALS) TABS tablet Take 1 tablet by mouth daily.    . QUEtiapine (SEROQUEL) 200 MG tablet Take 200 mg by mouth at bedtime.  0  . QUEtiapine (SEROQUEL) 25 MG tablet Take 25 mg by mouth every 4 (four) hours as needed.  0  . senna-docusate (SENOKOT-S) 8.6-50 MG tablet Take 1 tablet by mouth at bedtime as needed for mild constipation.    . thiamine 100 MG tablet Take 1 tablet (100 mg total) by mouth daily.      Results for orders placed or performed during the hospital encounter of 12/26/18 (from the past 48 hour(s))  Glucose, capillary     Status: Abnormal   Collection Time: 12/26/18  1:25 AM  Result Value Ref Range   Glucose-Capillary >600 (HH) 70 - 99 mg/dL  CBC     Status: Abnormal   Collection Time: 12/26/18  1:28 AM  Result Value Ref Range  WBC 21.6 (H) 4.0 - 10.5 K/uL   RBC 5.10 3.87 - 5.11 MIL/uL   Hemoglobin 15.5 (H) 12.0 - 15.0 g/dL   HCT 48.3 (H) 36.0 - 46.0 %   MCV 94.7 80.0 - 100.0 fL   MCH 30.4 26.0 - 34.0 pg   MCHC 32.1 30.0 - 36.0 g/dL   RDW 12.7 11.5 - 15.5 %   Platelets 321 150 - 400 K/uL   nRBC 0.0 0.0 - 0.2 %    Comment: Performed at [facility], [address] [place], [place] [address]  Comprehensive metabolic panel     Status: Abnormal   Collection Time: 12/26/18  1:28 AM  Result Value Ref Range   Sodium 128 (L) 135 - 145 mmol/L   Potassium 5.7 (H) 3.5 - 5.1 mmol/L   Chloride 91 (L) 98 - 111 mmol/L   CO2 <7 (L) 22 - 32 mmol/L   Glucose, Bld 1,081 (HH) 70 - 99  mg/dL    Comment: CRITICAL RESULT CALLED TO, READ BACK BY AND VERIFIED WITH RACHEL HAYDEN 0225 ON 12/26/2018 JJB    BUN 46 (H) 6 - 20 mg/dL   Creatinine, Ser 2.74 (H) 0.44 - 1.00 mg/dL   Calcium 9.7 8.9 - 10.3 mg/dL   Total Protein 7.7 6.5 - 8.1 g/dL   Albumin 4.1 3.5 - 5.0 g/dL   AST 14 (L) 15 - 41 U/L   ALT 12 0 - 44 U/L   Alkaline Phosphatase 108 38 - 126 U/L   Total Bilirubin 1.4 (H) 0.3 - 1.2 mg/dL   GFR calc non Af Amer 18 (L) >60 mL/min   GFR calc Af Amer 21 (L) >60 mL/min   Anion gap NOT CALCULATED 5 - 15    Comment: Performed at [facility], [address] [place], [place] [address]  Blood gas, venous     Status: Abnormal   Collection Time: 12/26/18  1:30 AM  Result Value Ref Range   pH, Ven 7.01 (LL) 7.250 - 7.430    Comment: CRITICAL RESULT, NOTIFIED PHYSICIAN BROWN, MD AT 0215 ON 12/26/2018 CH, RRT    pCO2, Ven 30 (L) 44.0 - 60.0 mmHg   pO2, Ven 37.0 32.0 - 45.0 mmHg   Bicarbonate 7.6 (L) 20.0 - 28.0 mmol/L   Acid-base deficit 22.5 (H) 0.0 - 2.0 mmol/L   O2 Saturation 40.3 %   Patient temperature 37.0    Collection site VEIN    Sample type VENOUS     Comment: Performed at [facility], [address] [place], [place] [address]  Urinalysis, Complete w Microscopic     Status: Abnormal   Collection Time: 12/26/18  3:02 AM  Result Value Ref Range   Color, Urine STRAW (A) YELLOW   APPearance HAZY (A) CLEAR   Specific Gravity, Urine 1.021 1.005 - 1.030   pH 5.0 5.0 - 8.0   Glucose, UA >=500 (A) NEGATIVE mg/dL   Hgb urine dipstick SMALL (A) NEGATIVE   Bilirubin Urine NEGATIVE NEGATIVE   Ketones, ur 80 (A) NEGATIVE mg/dL   Protein, ur 30 (A) NEGATIVE mg/dL   Nitrite NEGATIVE NEGATIVE   Leukocytes,Ua NEGATIVE NEGATIVE   RBC / HPF 0-5 0 - 5 RBC/hpf   WBC, UA 0-5 0 - 5 WBC/hpf   Bacteria, UA NONE SEEN NONE SEEN   Squamous Epithelial / LPF 0-5 0 - 5   Mucus PRESENT     Comment: Performed at [facility], [address]  [place], [place]  [address]  Urine Drug Screen, Qualitative ([place] only)     Status: Abnormal   Collection Time: 12/26/18  3:02 AM  Result Value Ref Range   Tricyclic, Ur Screen NONE DETECTED NONE DETECTED   Amphetamines, Ur Screen NONE DETECTED NONE DETECTED   MDMA (Ecstasy)Ur Screen NONE DETECTED NONE DETECTED   Cocaine Metabolite,Ur [place] NONE DETECTED NONE DETECTED   Opiate, Ur Screen POSITIVE (A) NONE DETECTED   Phencyclidine (PCP) Ur S NONE DETECTED NONE DETECTED   Cannabinoid 50 Ng, Ur [place] NONE DETECTED NONE DETECTED   Barbiturates, Ur Screen NONE DETECTED NONE DETECTED   Benzodiazepine, Ur Scrn NONE DETECTED NONE DETECTED   Methadone Scn, Ur NONE DETECTED NONE DETECTED    Comment: (NOTE) Tricyclics + metabolites, urine    Cutoff 1000 ng/mL Amphetamines + metabolites, urine  Cutoff 1000 ng/mL MDMA (Ecstasy), urine              Cutoff 500 ng/mL Cocaine Metabolite, urine          Cutoff 300 ng/mL Opiate + metabolites, urine        Cutoff 300 ng/mL Phencyclidine (PCP), urine         Cutoff 25 ng/mL Cannabinoid, urine                 Cutoff 50 ng/mL Barbiturates + metabolites, urine  Cutoff 200 ng/mL Benzodiazepine, urine              Cutoff 200 ng/mL Methadone, urine                   Cutoff 300 ng/mL The urine drug screen provides only a preliminary, unconfirmed analytical test result and should not be used for non-medical purposes. Clinical consideration and professional judgment should be applied to any positive drug screen result due to possible interfering substances. A more specific alternate chemical method must be used in order to obtain a confirmed analytical result. Gas chromatography / mass spectrometry (GC/MS) is the preferred confirmat ory method. Performed at [facility], [address] [place], [place] [address]   Ethanol     Status: None   Collection Time: 12/26/18  3:12 AM  Result Value Ref Range   Alcohol, Ethyl (B) <10 <10 mg/dL    Comment:  (NOTE) Lowest detectable limit for serum alcohol is 10 mg/dL. For medical purposes only. Performed at [facility], [address] [place], [place] [address]   POC urine preg, ED     Status: None   Collection Time: 12/26/18  3:22 AM  Result Value Ref Range   Preg Test, Ur Negative Negative  Glucose, capillary     Status: Abnormal   Collection Time: 12/26/18  4:02 AM  Result Value Ref Range   Glucose-Capillary >600 (HH) 70 - 99 mg/dL  MRSA PCR Screening     Status: None   Collection Time: 12/26/18  4:12 AM   Specimen: Nasopharyngeal  Result Value Ref Range   MRSA by PCR NEGATIVE NEGATIVE    Comment:        The GeneXpert MRSA Assay (FDA approved for NASAL specimens only), is one component of a comprehensive MRSA colonization surveillance program. It is not intended to diagnose MRSA infection nor to guide or monitor treatment for MRSA infections. Performed at [facility], [address] [place], [place] [address]   Glucose, capillary     Status: Abnormal   Collection Time: 12/26/18  6:08 AM  Result Value Ref Range   Glucose-Capillary >600 (  HH) 70 - 99 mg/dL   Ct Abdomen Pelvis W Contrast  Result Date: 12/26/2018 CLINICAL DATA:  58 y/o F; hyperglycemia and abdominal pain. History of diabetes. EXAM: CT ABDOMEN AND PELVIS WITH CONTRAST TECHNIQUE: Multidetector CT imaging of the abdomen and pelvis was performed using the standard protocol following bolus administration of intravenous contrast. CONTRAST:  149m OMNIPAQUE IOHEXOL 300 MG/ML  SOLN COMPARISON:  03/15/2018 MRI of the abdomen. 03/04/2018 CT abdomen and pelvis. FINDINGS: Lower chest: No acute abnormality. Hepatobiliary: No focal liver abnormality is seen. No gallstones, gallbladder wall thickening, or biliary dilatation. Pancreas: Edema surrounds the pancreas diffusely. No discrete acute peripancreatic collection. No main duct dilatation of the pancreas. Pancreas homogeneously enhances. Spleen: Normal in  size without focal abnormality. Adrenals/Urinary Tract: Adrenal glands are unremarkable. Kidneys are normal, without renal calculi, focal lesion, or hydronephrosis. Bladder is unremarkable. Stomach/Bowel: Stomach is within normal limits. Appendix appears normal. No evidence of bowel wall thickening, distention, or inflammatory changes. Vascular/Lymphatic: Aortic atherosclerosis. No enlarged abdominal or pelvic lymph nodes. Reproductive: Status post hysterectomy. No adnexal masses. Other: No abdominal wall hernia or abnormality. No abdominopelvic ascites. Musculoskeletal: No fracture is seen. Interval resection of right flank fatty mass. Moderate loss of height of the L5-S1 intervertebral disc space. IMPRESSION: Extensive edema surrounding the pancreas compatible with acute pancreatitis. No findings of pancreatic necrosis or acute peripancreatic collection at this time. Electronically Signed   By: LKristine GarbeM.D.   On: 12/26/2018 02:46    Review of Systems  Constitutional: Negative for chills and fever.  HENT: Negative for sore throat and tinnitus.   Eyes: Negative for blurred vision and redness.  Respiratory: Negative for cough and shortness of breath.   Cardiovascular: Negative for chest pain, palpitations, orthopnea and PND.  Gastrointestinal: Positive for abdominal pain. Negative for diarrhea, nausea and vomiting.  Genitourinary: Negative for dysuria, frequency and urgency.  Musculoskeletal: Negative for joint pain and myalgias.  Skin: Negative for rash.       No lesions  Neurological: Negative for speech change, focal weakness and weakness.  Endo/Heme/Allergies: Does not bruise/bleed easily.       No temperature intolerance  Psychiatric/Behavioral: Negative for depression and suicidal ideas.    Blood pressure (!) 144/72, pulse (!) 102, temperature 97.6 F (36.4 C), temperature source Axillary, resp. rate (!) 31, height '5\' 6"'  (1.676 m), weight 91.3 kg, SpO2 99 %. Physical Exam   Vitals reviewed. Constitutional: She is oriented to person, place, and time. She appears well-developed and well-nourished. No distress.  HENT:  Head: Normocephalic and atraumatic.  Mouth/Throat: Oropharynx is clear and moist.  Eyes: Pupils are equal, round, and reactive to light. Conjunctivae and EOM are normal.  Neck: Normal range of motion. Neck supple. No JVD present. No tracheal deviation present. No thyromegaly present.  Cardiovascular: Normal rate, regular rhythm and normal heart sounds. Exam reveals no gallop and no friction rub.  No murmur heard. Respiratory: Effort normal and breath sounds normal.  GI: Soft. Bowel sounds are normal. She exhibits no distension. There is no abdominal tenderness.  Genitourinary:    Genitourinary Comments: Deferred   Musculoskeletal: Normal range of motion.        General: No edema.  Lymphadenopathy:    She has no cervical adenopathy.  Neurological: She is alert and oriented to person, place, and time. No cranial nerve deficit. She exhibits normal muscle tone.  Skin: Skin is warm and dry. No rash noted. No erythema.  Psychiatric: She has a normal mood and affect. Her  behavior is normal. Judgment and thought content normal.     Assessment/Plan This is a 58 year old female admitted for DKA. 1.  DKA: Hyperglycemia with severe acidosis and enlarged anion gap.  Initiate insulin drip until anion gap is closed.  Then start long-acting insulin as well as glucose.  Monitor potassium 2.  Pancreatitis: Seen on CT scan without evidence of necrosis.  (Lipase not calculated).  Concomitant pancreatitis and DKA significantly increases mortality.  Aggressive IV hydration.  Hold lisinopril and metformin 3.  Hyponatremia: Secondary to hyperglycemia.  Normal saline as above 4.  Hyperkalemia: Monitor telemetry.  No arrhythmias at this time. 5.  DVT prophylaxis: Lovenox 6.  GI prophylaxis: None The patient is a full code.  I have personally spent 45 minutes in  critical care time with this patient.  Harrie Foreman, MD 12/26/2018, 6:48 AM

## 2018-12-26 NOTE — ED Triage Notes (Signed)
Pt presents from home via acems with c/o hyperglycemia. hx of diabetes. 22G IV placed by ems. Pt c/o abdominal pain at this time. Pt reports she takes insulin at home.

## 2018-12-26 NOTE — Progress Notes (Addendum)
[facility] - [place] [facility]   PATIENT NAME: Pamela Lane    MR#:  [id number]  DATE OF BIRTH:  06/09/1961  SUBJECTIVE:  CHIEF COMPLAINT:   Chief Complaint  Patient presents with  . Hyperglycemia  Patient seen today Lethargic and sleepy Unable to give any history  REVIEW OF SYSTEMS:    ROS  Could not be obtained as patient is lethargic  DRUG ALLERGIES:  No Known Allergies  VITALS:  Blood pressure 137/80, pulse (!) 104, temperature 97.6 F (36.4 C), temperature source Axillary, resp. rate 19, height 5\' 6"  (1.676 m), weight 91.3 kg, SpO2 99 %.  PHYSICAL EXAMINATION:   Physical Exam  GENERAL:  58 y.o.-year-old patient lying in the bed  EYES: Pupils equal, round, reactive to light and accommodation. No scleral icterus. Extraocular muscles intact.  HEENT: Head atraumatic, normocephalic. Oropharynx and nasopharynx clear.  NECK:  Supple, no jugular venous distention. No thyroid enlargement, no tenderness.  LUNGS: Normal breath sounds bilaterally, no wheezing, rales, rhonchi. No use of accessory muscles of respiration.  CARDIOVASCULAR: S1, S2 normal. No murmurs, rubs, or gallops.  ABDOMEN: Soft, nontender, nondistended. Bowel sounds present. No organomegaly or mass.  EXTREMITIES: No cyanosis, clubbing or edema b/l.    NEUROLOGIC: Lethargic and sleepy Moves all extremities PSYCHIATRIC: Could not be assessed SKIN: No obvious rash, lesion, or ulcer.   LABORATORY PANEL:   CBC Recent Labs  Lab 12/26/18 0128  WBC 21.6*  HGB 15.5*  HCT 48.3*  PLT 321   ------------------------------------------------------------------------------------------------------------------ Chemistries  Recent Labs  Lab 12/26/18 0128 12/26/18 0627  NA 128* 137  K 5.7* 4.0  CL 91* 108  CO2 <7* 9*  GLUCOSE 1,081* 585*  BUN 46* 40*  CREATININE 2.74* 2.39*  CALCIUM 9.7 9.5  MG  --  2.7*  AST 14*  --   ALT 12  --   ALKPHOS 108  --   BILITOT 1.4*  --     ------------------------------------------------------------------------------------------------------------------  Cardiac Enzymes Recent Labs  Lab 12/26/18 0627  TROPONINI 0.04*   ------------------------------------------------------------------------------------------------------------------  RADIOLOGY:  Ct Abdomen Pelvis W Contrast  Result Date: 12/26/2018 CLINICAL DATA:  58 y/o F; hyperglycemia and abdominal pain. History of diabetes. EXAM: CT ABDOMEN AND PELVIS WITH CONTRAST TECHNIQUE: Multidetector CT imaging of the abdomen and pelvis was performed using the standard protocol following bolus administration of intravenous contrast. CONTRAST:  100mL OMNIPAQUE IOHEXOL 300 MG/ML  SOLN COMPARISON:  03/15/2018 MRI of the abdomen. 03/04/2018 CT abdomen and pelvis. FINDINGS: Lower chest: No acute abnormality. Hepatobiliary: No focal liver abnormality is seen. No gallstones, gallbladder wall thickening, or biliary dilatation. Pancreas: Edema surrounds the pancreas diffusely. No discrete acute peripancreatic collection. No main duct dilatation of the pancreas. Pancreas homogeneously enhances. Spleen: Normal in size without focal abnormality. Adrenals/Urinary Tract: Adrenal glands are unremarkable. Kidneys are normal, without renal calculi, focal lesion, or hydronephrosis. Bladder is unremarkable. Stomach/Bowel: Stomach is within normal limits. Appendix appears normal. No evidence of bowel wall thickening, distention, or inflammatory changes. Vascular/Lymphatic: Aortic atherosclerosis. No enlarged abdominal or pelvic lymph nodes. Reproductive: Status post hysterectomy. No adnexal masses. Other: No abdominal wall hernia or abnormality. No abdominopelvic ascites. Musculoskeletal: No fracture is seen. Interval resection of right flank fatty mass. Moderate loss of height of the L5-S1 intervertebral disc space. IMPRESSION: Extensive edema surrounding the pancreas compatible with acute pancreatitis. No  findings of pancreatic necrosis or acute peripancreatic collection at this time. Electronically Signed   By: Mitzi HansenLance  Furusawa-Stratton M.D.   On: 12/26/2018 02:46  ASSESSMENT AND PLAN:  58 year old female patient with history of diabetes mellitus, hypertension, bronchial asthma currently in the stepdown unit  -Acute diabetic ketoacidosis IV insulin drip Monitor blood sugars closely Monitor electrolytes NPO for now  -Hyponatremia IV fluid hydration Probably secondary to hyperglycemia  -Acute pancreatitis No evidence of necrosis on CT scan N.p.o. for now IV fluids ACE inhibitor on hold F/U Lipase levels  -DVT prophylaxis subcu Lovenox daily  -Hyperkalemia Imroved  All the records are reviewed and case discussed with Care Management/Social Worker. Management plans discussed with the patient, family and they are in agreement.  CODE STATUS: Full code  DVT Prophylaxis: SCDs  TOTAL CRITICAL CARE TIME TAKING CARE OF THIS PATIENT: 35 minutes.   POSSIBLE D/C IN 2 to 3 DAYS, DEPENDING ON CLINICAL CONDITION.  Saundra Shelling M.D on 12/26/2018 at 10:57 AM  Between 7am to 6pm - Pager - [phone]  After 6pm go to [email or web address] - password [email or web address] [facility]  Office  [phone]  CC: Primary care physician; Center, [place]  Note: This dictation was prepared with Diplomatic Services operational officer dictation along with smaller phrase technology. Any transcriptional errors that result from this process are unintentional.

## 2018-12-26 NOTE — ED Notes (Signed)
.. ED TO INPATIENT HANDOFF REPORT  ED Nurse Name and Phone #: Governor Rooksnnie [phone]  S Name/Age/Gender Pamela Lane 58 y.o. female Room/Bed: ED24A/ED24A  Code Status   Code Status: Prior  Home/SNF/Other Home Patient oriented to: self and place Is this baseline? No   Triage Complete: Triage complete  Chief Complaint hyperglycemia  Triage Note Pt presents from home via acems with c/o hyperglycemia. hx of diabetes. 22G IV placed by ems. Pt c/o abdominal pain at this time. Pt reports she takes insulin at home.   Allergies No Known Allergies  Level of Care/Admitting Diagnosis ED Disposition    ED Disposition Condition Comment   Admit  Hospital Area: [facility] [[id number]]  Level of Care: ICU [6]  Covid Evaluation: Screening Protocol (No Symptoms)  Diagnosis: DKA (diabetic ketoacidoses) [facility](HCC) [[id number]][[id number]]  Admitting Physician: Arnaldo NatalIAMOND, MICHAEL S [[id number]]  Attending Physician: Arnaldo NatalDIAMOND, MICHAEL S [[id number]]  Estimated length of stay: past midnight tomorrow  Certification:: I certify this patient will need inpatient services for at least 2 midnights  PT Class (Do Not Modify): Inpatient [101]  PT Acc Code (Do Not Modify): Private [1]       B Medical/Surgery History Past Medical History:  Diagnosis Date  . Asthma   . Diabetes mellitus without complication (HCC)   . Hypertension   . Mental disorder    pt reports 'I have all of them mental disorders'   Past Surgical History:  Procedure Laterality Date  . ABDOMINAL HYSTERECTOMY       A IV Location/Drains/Wounds Patient Lines/Drains/Airways Status   Active Line/Drains/Airways    Name:   Placement date:   Placement time:   Site:   Days:   Peripheral IV 12/26/18 Left Hand   12/26/18    0131    Hand   less than 1   Peripheral IV 12/26/18 Left;Posterior Forearm   12/26/18    0207    Forearm   less than 1          Intake/Output Last 24 hours No intake or output data in the 24 hours ending 12/26/18  0322  Labs/Imaging Results for orders placed or performed during the hospital encounter of 12/26/18 (from the past 48 hour(s))  Glucose, capillary     Status: Abnormal   Collection Time: 12/26/18  1:25 AM  Result Value Ref Range   Glucose-Capillary >600 (HH) 70 - 99 mg/dL  CBC     Status: Abnormal   Collection Time: 12/26/18  1:28 AM  Result Value Ref Range   WBC 21.6 (H) 4.0 - 10.5 K/uL   RBC 5.10 3.87 - 5.11 MIL/uL   Hemoglobin 15.5 (H) 12.0 - 15.0 g/dL   HCT 29.548.3 (H) 62.136.0 - 30.846.0 %   MCV 94.7 80.0 - 100.0 fL   MCH 30.4 26.0 - 34.0 pg   MCHC 32.1 30.0 - 36.0 g/dL   RDW 65.712.7 84.611.5 - 96.215.5 %   Platelets 321 150 - 400 K/uL   nRBC 0.0 0.0 - 0.2 %    Comment: Performed at [facility], [address] [place], [place] [address]  Comprehensive metabolic panel     Status: Abnormal   Collection Time: 12/26/18  1:28 AM  Result Value Ref Range   Sodium 128 (L) 135 - 145 mmol/L   Potassium 5.7 (H) 3.5 - 5.1 mmol/L   Chloride 91 (L) 98 - 111 mmol/L   CO2 <7 (L) 22 - 32 mmol/L   Glucose, Bld 1,081 (HH) 70 - 99  mg/dL    Comment: CRITICAL RESULT CALLED TO, READ BACK BY AND VERIFIED WITH RACHEL HAYDEN 0225 ON 12/26/2018 JJB    BUN 46 (H) 6 - 20 mg/dL   Creatinine, Ser 2.74 (H) 0.44 - 1.00 mg/dL   Calcium 9.7 8.9 - 10.3 mg/dL   Total Protein 7.7 6.5 - 8.1 g/dL   Albumin 4.1 3.5 - 5.0 g/dL   AST 14 (L) 15 - 41 U/L   ALT 12 0 - 44 U/L   Alkaline Phosphatase 108 38 - 126 U/L   Total Bilirubin 1.4 (H) 0.3 - 1.2 mg/dL   GFR calc non Af Amer 18 (L) >60 mL/min   GFR calc Af Amer 21 (L) >60 mL/min   Anion gap NOT CALCULATED 5 - 15    Comment: Performed at [facility], [address] [place], [place] [address]  Blood gas, venous     Status: Abnormal   Collection Time: 12/26/18  1:30 AM  Result Value Ref Range   pH, Ven 7.01 (LL) 7.250 - 7.430    Comment: CRITICAL RESULT, NOTIFIED PHYSICIAN BROWN, MD AT 0215 ON 12/26/2018 CH, RRT    pCO2, Ven 30 (L) 44.0 - 60.0 mmHg    pO2, Ven 37.0 32.0 - 45.0 mmHg   Bicarbonate 7.6 (L) 20.0 - 28.0 mmol/L   Acid-base deficit 22.5 (H) 0.0 - 2.0 mmol/L   O2 Saturation 40.3 %   Patient temperature 37.0    Collection site VEIN    Sample type VENOUS     Comment: Performed at [facility], [address] [place], [place] [address]  Urinalysis, Complete w Microscopic     Status: Abnormal   Collection Time: 12/26/18  3:02 AM  Result Value Ref Range   Color, Urine STRAW (A) YELLOW   APPearance HAZY (A) CLEAR   Specific Gravity, Urine 1.021 1.005 - 1.030   pH 5.0 5.0 - 8.0   Glucose, UA >=500 (A) NEGATIVE mg/dL   Hgb urine dipstick SMALL (A) NEGATIVE   Bilirubin Urine NEGATIVE NEGATIVE   Ketones, ur 80 (A) NEGATIVE mg/dL   Protein, ur 30 (A) NEGATIVE mg/dL   Nitrite NEGATIVE NEGATIVE   Leukocytes,Ua NEGATIVE NEGATIVE   RBC / HPF 0-5 0 - 5 RBC/hpf   WBC, UA 0-5 0 - 5 WBC/hpf   Bacteria, UA NONE SEEN NONE SEEN   Squamous Epithelial / LPF 0-5 0 - 5   Mucus PRESENT     Comment: Performed at [facility], [address] [place], [place] [address]  POC urine preg, ED     Status: None   Collection Time: 12/26/18  3:22 AM  Result Value Ref Range   Preg Test, Ur Negative Negative   Ct Abdomen Pelvis W Contrast  Result Date: 12/26/2018 CLINICAL DATA:  58 y/o F; hyperglycemia and abdominal pain. History of diabetes. EXAM: CT ABDOMEN AND PELVIS WITH CONTRAST TECHNIQUE: Multidetector CT imaging of the abdomen and pelvis was performed using the standard protocol following bolus administration of intravenous contrast. CONTRAST:  180mL OMNIPAQUE IOHEXOL 300 MG/ML  SOLN COMPARISON:  03/15/2018 MRI of the abdomen. 03/04/2018 CT abdomen and pelvis. FINDINGS: Lower chest: No acute abnormality. Hepatobiliary: No focal liver abnormality is seen. No gallstones, gallbladder wall thickening, or biliary dilatation. Pancreas: Edema surrounds the pancreas diffusely. No discrete acute peripancreatic collection. No main duct  dilatation of the pancreas. Pancreas homogeneously enhances. Spleen: Normal in size without focal abnormality. Adrenals/Urinary Tract: Adrenal glands are unremarkable. Kidneys are normal, without renal  calculi, focal lesion, or hydronephrosis. Bladder is unremarkable. Stomach/Bowel: Stomach is within normal limits. Appendix appears normal. No evidence of bowel wall thickening, distention, or inflammatory changes. Vascular/Lymphatic: Aortic atherosclerosis. No enlarged abdominal or pelvic lymph nodes. Reproductive: Status post hysterectomy. No adnexal masses. Other: No abdominal wall hernia or abnormality. No abdominopelvic ascites. Musculoskeletal: No fracture is seen. Interval resection of right flank fatty mass. Moderate loss of height of the L5-S1 intervertebral disc space. IMPRESSION: Extensive edema surrounding the pancreas compatible with acute pancreatitis. No findings of pancreatic necrosis or acute peripancreatic collection at this time. Electronically Signed   By: Mitzi HansenLance  Furusawa-Stratton M.D.   On: 12/26/2018 02:46    Pending Labs Unresulted Labs (From admission, onward)    Start     Ordered   12/26/18 0259  Ethanol  Add-on,   AD     12/26/18 0258   12/26/18 0258  Urine Drug Screen, Qualitative ([facility] only)  Once,   STAT     12/26/18 0257   Signed and Held  Basic metabolic panel  STAT Now then every 4 hours ,   STAT     Signed and Held   Signed and Held  Creatinine, serum  (enoxaparin (LOVENOX)    CrCl >/= 30 ml/min)  Weekly,   R    Comments: while on enoxaparin therapy    Signed and Held   Signed and Held  Culture, blood (routine x 2)  BLOOD CULTURE X 2,   R     Signed and Held          Vitals/Pain Today's Vitals   12/26/18 0125 12/26/18 0127 12/26/18 0205 12/26/18 0206  BP:  129/85  (!) 158/101  Pulse:  (!) 112  (!) 111  Resp:  (!) 27  20  Temp:  (!) 97.4 F (36.3 C)    TempSrc:  Axillary    SpO2:  93%  100%  Weight: 92 kg     Height: 5\' 5"  (1.651 m)     PainSc: 8   8       Isolation Precautions No active isolations  Medications Medications  insulin regular, human (MYXREDLIN) 100 units/ 100 mL infusion (5.4 Units/hr Intravenous New Bag/Given 12/26/18 0250)  potassium chloride 10 mEq in 100 mL IVPB (10 mEq Intravenous New Bag/Given 12/26/18 0303)  morphine 4 MG/ML injection 4 mg (4 mg Intravenous Given 12/26/18 0135)  sodium chloride 0.9 % bolus 1,000 mL (1,000 mLs Intravenous New Bag/Given 12/26/18 0137)  sodium chloride 0.9 % bolus 1,000 mL (1,000 mLs Intravenous New Bag/Given 12/26/18 0137)  iohexol (OMNIPAQUE) 300 MG/ML solution 100 mL (100 mLs Intravenous Contrast Given 12/26/18 0225)    Mobility walks with device Moderate fall risk   Focused Assessments Neuro Assessment Handoff:  Swallow screen pass? Yes  Cardiac Rhythm: Sinus tachycardia       Neuro Assessment:   Neuro Checks:      Last Documented NIHSS Modified Score:   Has TPA been given?  If patient is a Neuro Trauma and patient is going to OR before floor call report to 4N Charge nurse: [phone] or [phone]     R Recommendations: See Admitting Provider Note  Report given to:   Additional Notes:

## 2018-12-26 NOTE — ED Provider Notes (Signed)
[facility] Emergency Department Provider Note    First MD Initiated Contact with Patient 12/26/18 [phone]     (approximate)  I have reviewed the triage vital signs and the nursing notes.  Level 5 caveat history physical exam and review of system limited secondary to altered mental status  HISTORY  Chief Complaint Hyperglycemia    HPI Pamela Lane is a 58 y.o. female with below list of previous medical conditions including hypertension diabetes cocaine abuse presents to the emergency department via EMS secondary to abdominal pain and hyperglycemia.  Per EMS the patient's glucose read "high".  Patient markedly unpleasant on arrival cursing at myself and staff.       Past Medical History:  Diagnosis Date  . Asthma   . Diabetes mellitus without complication ([place])   . Hypertension   . Mental disorder    pt reports 'I have all of them mental disorders'    Patient Active Problem List   Diagnosis Date Noted  . Acute pancreatitis 05/15/2016  . AKI (acute kidney injury) ([place])   . Diabetic ketoacidosis without coma associated with type 2 diabetes mellitus ([place])   . Hyperglycemia due to type 2 diabetes mellitus ([place]) 01/13/2016  . UTI (urinary tract infection) 01/13/2016  . DKA (diabetic ketoacidoses) ([place]) 01/08/2016  . Hyponatremia 01/08/2016  . Dehydration 01/08/2016  . Urinary tract infection 01/08/2016  . Upper abdominal pain 01/08/2016  . Nausea & vomiting 01/08/2016    Past Surgical History:  Procedure Laterality Date  . ABDOMINAL HYSTERECTOMY      Prior to Admission medications   Medication Sig Start Date End Date Taking? Authorizing Provider  albuterol (PROVENTIL HFA;VENTOLIN HFA) 108 (90 Base) MCG/ACT inhaler Inhale 2 puffs into the lungs every 6 (six) hours as needed for wheezing or shortness of breath. 02/18/18   Dustin Flock, MD  aspirin EC 81 MG tablet Take 81 mg by mouth daily.    [provider]  blood glucose meter  kit and supplies KIT Dispense based on patient and insurance preference. Use up to four times daily as directed. (FOR ICD-9 250.00, 250.01). 02/18/18   Dustin Flock, MD  buPROPion [facility] SR) 150 MG 12 hr tablet Take 1 tablet (150 mg total) by mouth 2 (two) times daily. 02/18/18 03/20/18  Dustin Flock, MD  folic acid (FOLVITE) 1 MG tablet Take 1 tablet (1 mg total) by mouth daily. 03/08/18   Gouru, Illene Silver, MD  ipratropium-albuterol (DUONEB) 0.5-2.5 (3) MG/3ML SOLN Take 3 mLs by nebulization every 6 (six) hours as needed for wheezing, shortness of breath or cough. 02/23/18   [provider]  lisinopril (PRINIVIL) 10 MG tablet Take 1 tablet (10 mg total) by mouth daily. 02/18/18   Dustin Flock, MD  metFORMIN (GLUCOPHAGE) 500 MG tablet Take 1 tablet (500 mg total) by mouth 2 (two) times daily with a meal. 02/18/18   Dustin Flock, MD  Multiple Vitamin (MULTIVITAMIN WITH MINERALS) TABS tablet Take 1 tablet by mouth daily. 03/08/18   Gouru, Illene Silver, MD  QUEtiapine (SEROQUEL) 200 MG tablet Take 200 mg by mouth at bedtime. 03/02/18   [provider]  QUEtiapine (SEROQUEL) 25 MG tablet Take 25 mg by mouth every 4 (four) hours as needed. 03/02/18   [provider]  senna-docusate (SENOKOT-S) 8.6-50 MG tablet Take 1 tablet by mouth at bedtime as needed for mild constipation. 03/07/18   Nicholes Mango, MD  thiamine 100 MG tablet Take 1 tablet (100 mg total) by mouth daily. 03/08/18  Nicholes Mango, MD    Allergies Patient has no known allergies.  Family History  Problem Relation Age of Onset  . Hypertension Mother     Social History Social History   Tobacco Use  . Smoking status: Current Every Day Smoker    Packs/day: 0.10    Types: Cigarettes  . Smokeless tobacco: Never Used  Substance Use Topics  . Alcohol use: Yes    Alcohol/week: 40.0 standard drinks    Types: 40 Cans of beer per week    Comment: 3 big bottles of liquor  . Drug use: Yes    Types: Marijuana, Cocaine     Review of Systems Constitutional: No fever/chills Eyes: No visual changes. ENT: No sore throat. Cardiovascular: Denies chest pain. Respiratory: Denies shortness of breath. Gastrointestinal: Positive for abdominal pain and nausea Genitourinary: Negative for dysuria. Musculoskeletal: Negative for neck pain.  Negative for back pain. Integumentary: Negative for rash. Neurological: Negative for headaches, focal weakness or numbness. Psychiatric:  Unpleasant agitated and cursing at staff Endocrine: Positive for hyperglycemia ____________________________________________   PHYSICAL EXAM:  VITAL SIGNS: ED Triage Vitals  Enc Vitals Group     BP 12/26/18 0127 129/85     Pulse Rate 12/26/18 0127 (!) 112     Resp 12/26/18 0127 (!) 27     Temp 12/26/18 0127 (!) 97.4 F (36.3 C)     Temp Source 12/26/18 0127 Axillary     SpO2 12/26/18 0124 92 %     Weight 12/26/18 0125 92 kg (202 lb 13.2 oz)     Height 12/26/18 0125 1.651 m ('5\' 5"' )     Head Circumference --      Peak Flow --      Pain Score 12/26/18 0125 8     Pain Loc --      Pain Edu? --      Excl. in [place]? --     Constitutional: Alert but verbally abusive to staff. Eyes: Conjunctivae are normal. Head: Atraumatic. Mouth/Throat: Mucous membranes are dry.  Oropharynx non-erythematous. Neck: No stridor.   Cardiovascular: Tachycardia, regular rhythm. Good peripheral circulation. Grossly normal heart sounds. Respiratory: Normal respiratory effort.  No retractions. No audible wheezing. Gastrointestinal: Soft and nontender. No distention.  Musculoskeletal: No lower extremity tenderness nor edema. No gross deformities of extremities. Neurologic: No gross focal neurologic deficits are appreciated.  Skin:  Skin is warm, dry and intact. No rash noted. Psychiatric: Agitated verbally abusive to staff  ____________________________________________   LABS (all labs ordered are listed, but only abnormal results are displayed)  Labs  Reviewed  GLUCOSE, CAPILLARY - Abnormal; Notable for the following components:      Result Value   Glucose-Capillary >600 (*)    All other components within normal limits  CBC - Abnormal; Notable for the following components:   WBC 21.6 (*)    Hemoglobin 15.5 (*)    HCT 48.3 (*)    All other components within normal limits  COMPREHENSIVE METABOLIC PANEL - Abnormal; Notable for the following components:   Sodium 128 (*)    Potassium 5.7 (*)    Chloride 91 (*)    CO2 <7 (*)    Glucose, Bld 1,081 (*)    BUN 46 (*)    Creatinine, Ser 2.74 (*)    AST 14 (*)    Total Bilirubin 1.4 (*)    GFR calc non Af Amer 18 (*)    GFR calc Af Amer 21 (*)    All other components within normal  limits  URINALYSIS, COMPLETE (UACMP) WITH MICROSCOPIC - Abnormal; Notable for the following components:   Color, Urine STRAW (*)    APPearance HAZY (*)    Glucose, UA >=500 (*)    Hgb urine dipstick SMALL (*)    Ketones, ur 80 (*)    Protein, ur 30 (*)    All other components within normal limits  BLOOD GAS, VENOUS - Abnormal; Notable for the following components:   pH, Ven 7.01 (*)    pCO2, Ven 30 (*)    Bicarbonate 7.6 (*)    Acid-base deficit 22.5 (*)    All other components within normal limits  ETHANOL  URINE DRUG SCREEN, QUALITATIVE ([facility] ONLY)  POC URINE PREG, ED   ____________________________________________  EKG  ED ECG REPORT I, [place] N BROWN, the attending physician, personally viewed and interpreted this ECG.   Date: 12/26/2018  EKG Time: 1:31 AM  Rate: 169  Rhythm: Sinus tachycardia  Axis: Normal   Intervals: Normal  ST&T Change: None  ____________________________________________  RADIOLOGY I, [place] N BROWN, personally viewed and evaluated these images (plain radiographs) as part of my medical decision making, as well as reviewing the written report by the radiologist.  ED MD interpretation: Extensive edema surrounding pancreas compatible with acute pancreatitis on CT  abdomen pelvis.  Official radiology report(s): Ct Abdomen Pelvis W Contrast  Result Date: 12/26/2018 CLINICAL DATA:  58 y/o F; hyperglycemia and abdominal pain. History of diabetes. EXAM: CT ABDOMEN AND PELVIS WITH CONTRAST TECHNIQUE: Multidetector CT imaging of the abdomen and pelvis was performed using the standard protocol following bolus administration of intravenous contrast. CONTRAST:  177m OMNIPAQUE IOHEXOL 300 MG/ML  SOLN COMPARISON:  03/15/2018 MRI of the abdomen. 03/04/2018 CT abdomen and pelvis. FINDINGS: Lower chest: No acute abnormality. Hepatobiliary: No focal liver abnormality is seen. No gallstones, gallbladder wall thickening, or biliary dilatation. Pancreas: Edema surrounds the pancreas diffusely. No discrete acute peripancreatic collection. No main duct dilatation of the pancreas. Pancreas homogeneously enhances. Spleen: Normal in size without focal abnormality. Adrenals/Urinary Tract: Adrenal glands are unremarkable. Kidneys are normal, without renal calculi, focal lesion, or hydronephrosis. Bladder is unremarkable. Stomach/Bowel: Stomach is within normal limits. Appendix appears normal. No evidence of bowel wall thickening, distention, or inflammatory changes. Vascular/Lymphatic: Aortic atherosclerosis. No enlarged abdominal or pelvic lymph nodes. Reproductive: Status post hysterectomy. No adnexal masses. Other: No abdominal wall hernia or abnormality. No abdominopelvic ascites. Musculoskeletal: No fracture is seen. Interval resection of right flank fatty mass. Moderate loss of height of the L5-S1 intervertebral disc space. IMPRESSION: Extensive edema surrounding the pancreas compatible with acute pancreatitis. No findings of pancreatic necrosis or acute peripancreatic collection at this time. Electronically Signed   By: LKristine GarbeM.D.   On: 12/26/2018 02:46     .Critical Care Performed by: BGregor Hams MD Authorized by: BGregor Hams MD   Critical care  provider statement:    Critical care time (minutes):  45   Critical care time was exclusive of:  Separately billable procedures and treating other patients   Critical care was necessary to treat or prevent imminent or life-threatening deterioration of the following conditions:  Metabolic crisis   Critical care was time spent personally by me on the following activities:  Development of treatment plan with patient or surrogate, discussions with consultants, evaluation of patient's response to treatment, examination of patient, obtaining history from patient or surrogate, ordering and performing treatments and interventions, ordering and review of laboratory studies, ordering and review of radiographic studies,  pulse oximetry, re-evaluation of patient's condition and review of old charts     ____________________________________________   INITIAL IMPRESSION / MDM / [place] / ED COURSE  As part of my medical decision making, I reviewed the following data within the electronic MEDICAL RECORD NUMBER  58 year old female presented with above-stated history and physical exam concerning for diabetic ketoacidosis.  Patient's glucose 1081 CO2 less than 7.  White blood cell count 21.6.  VBG revealed a pH of 7.01 CO2 30 bicarb of 7.6.  Patient received 2 L IV normal saline insulin infusion initiated.  Patient also given IV morphine secondary to abdominal pain.     *Kiana Hollar Herrin was evaluated in Emergency Department on 12/26/2018 for the symptoms described in the history of present illness. She was evaluated in the context of the global COVID-19 pandemic, which necessitated consideration that the patient might be at risk for infection with the SARS-CoV-2 virus that causes COVID-19. Institutional protocols and algorithms that pertain to the evaluation of patients at risk for COVID-19 are in a state of rapid change based on information released by regulatory bodies including the CDC and federal and  state organizations. These policies and algorithms were followed during the patient's care in the ED.  Some ED evaluations and interventions may be delayed as a result of limited staffing during the pandemic.*    ____________________________________________  FINAL CLINICAL IMPRESSION(S) / ED DIAGNOSES  Final diagnoses:  Diabetic ketoacidosis without coma associated with type 2 diabetes mellitus (HCC)     MEDICATIONS GIVEN DURING THIS VISIT:  Medications  sodium chloride 0.9 % bolus 1,000 mL (1,000 mLs Intravenous Transfusing/Transfer 12/26/18 0137)  sodium chloride 0.9 % bolus 1,000 mL (1,000 mLs Intravenous Transfusing/Transfer 12/26/18 0137)  insulin regular, human (MYXREDLIN) 100 units/ 100 mL infusion (5.4 Units/hr Intravenous New Bag/Given 12/26/18 0250)  potassium chloride 10 mEq in 100 mL IVPB (10 mEq Intravenous Transfusing/Transfer 12/26/18 0303)  morphine 4 MG/ML injection 4 mg (4 mg Intravenous Given 12/26/18 0135)  iohexol (OMNIPAQUE) 300 MG/ML solution 100 mL (100 mLs Intravenous Contrast Given 12/26/18 0225)     ED Discharge Orders    None       Note:  This document was prepared using Dragon voice recognition software and may include unintentional dictation errors.   Gregor Hams, MD 12/26/18 [phone]

## 2018-12-26 NOTE — Progress Notes (Signed)
Inpatient Diabetes Program Recommendations  AACE/ADA: New Consensus Statement on Inpatient Glycemic Control (2015)  Target Ranges:  Prepandial:   less than 140 mg/dL      Peak postprandial:   less than 180 mg/dL (1-2 hours)      Critically ill patients:  140 - 180 mg/dL   Lab Results  Component Value Date   GLUCAP 333 (H) 12/26/2018   HGBA1C 14.6 (H) 02/17/2018    Review of Glycemic Control Results for Pamela Lane, Pamela Lane (MRN [id number]) as of 12/26/2018 09:55  Ref. Range 12/26/2018 07:14 12/26/2018 08:01 12/26/2018 09:11  Glucose-Capillary Latest Ref Range: 70 - 99 mg/dL 567 (HH) 445 (H) 333 (H)   Diabetes history: DM 2 Outpatient Diabetes medications:  Metformin 500 mg bid Current orders for Inpatient glycemic control:  IV insulin-DKA order set  Inpatient Diabetes Program Recommendations:    It does not appear that patient was taking insulin prior to admit, however admission at [facility] from February [facility], indicates that she was ordered Lantus 14 units daily.  Agree with current orders.  Will follow.  Patient will need insulin at d/c.  Please recheck A1C.   Thanks  Adah Perl, RN, BC-ADM Inpatient Diabetes Coordinator Pager [phone] (8a-5p)

## 2018-12-26 NOTE — Progress Notes (Signed)
PHARMACIST - PHYSICIAN COMMUNICATION  CONCERNING:  Enoxaparin (Lovenox) for DVT Prophylaxis    RECOMMENDATION: Patient was prescribed enoxaprin 30mg  q24 hours for VTE prophylaxis.   Filed Weights   12/26/18 0125 12/26/18 0405  Weight: 202 lb 13.2 oz (92 kg) 201 lb 4.5 oz (91.3 kg)    Body mass index is 32.49 kg/m.  Estimated Creatinine Clearance: 39.9 mL/min (A) (by C-G formula based on SCr of 1.77 mg/dL (H)).   Patient is candidate for enoxaparin 40mg  every 24 hours based on CrCl >61ml/min AND Weight > 45kg for female   DESCRIPTION: Pharmacy has adjusted enoxaparin dose per [facility] policy.  Patient is now receiving enoxaparin 40mg  every 24 hours.   Lu Duffel, PharmD, BCPS Clinical Pharmacist 12/26/2018 7:02 PM

## 2018-12-27 DIAGNOSIS — N10 Acute tubulo-interstitial nephritis: Secondary | ICD-10-CM

## 2018-12-27 DIAGNOSIS — F10230 Alcohol dependence with withdrawal, uncomplicated: Secondary | ICD-10-CM

## 2018-12-27 DIAGNOSIS — G908 Other disorders of autonomic nervous system: Secondary | ICD-10-CM

## 2018-12-27 LAB — BASIC METABOLIC PANEL
Anion gap: 14 (ref 5–15)
Anion gap: 11 (ref 5–15)
Anion gap: 14 (ref 5–15)
BUN: 40 mg/dL — ABNORMAL HIGH (ref 6–20)
BUN: 29 mg/dL — ABNORMAL HIGH (ref 6–20)
BUN: 30 mg/dL — ABNORMAL HIGH (ref 6–20)
CO2: 11 mmol/L — ABNORMAL LOW (ref 22–32)
CO2: 16 mmol/L — ABNORMAL LOW (ref 22–32)
CO2: 12 mmol/L — ABNORMAL LOW (ref 22–32)
Calcium: 8.9 mg/dL (ref 8.9–10.3)
Calcium: 8.4 mg/dL — ABNORMAL LOW (ref 8.9–10.3)
Calcium: 8.2 mg/dL — ABNORMAL LOW (ref 8.9–10.3)
Chloride: 115 mmol/L — ABNORMAL HIGH (ref 98–111)
Chloride: 118 mmol/L — ABNORMAL HIGH (ref 98–111)
Chloride: 118 mmol/L — ABNORMAL HIGH (ref 98–111)
Creatinine, Ser: 1.82 mg/dL — ABNORMAL HIGH (ref 0.44–1.00)
Creatinine, Ser: 1.36 mg/dL — ABNORMAL HIGH (ref 0.44–1.00)
Creatinine, Ser: 1.46 mg/dL — ABNORMAL HIGH (ref 0.44–1.00)
GFR calc Af Amer: 35 mL/min — ABNORMAL LOW (ref >60)
GFR calc Af Amer: 50 mL/min — ABNORMAL LOW (ref >60)
GFR calc Af Amer: 46 mL/min — ABNORMAL LOW (ref >60)
GFR calc non Af Amer: 30 mL/min — ABNORMAL LOW (ref >60)
GFR calc non Af Amer: 43 mL/min — ABNORMAL LOW (ref >60)
GFR calc non Af Amer: 40 mL/min — ABNORMAL LOW (ref >60)
Glucose, Bld: 364 mg/dL — ABNORMAL HIGH (ref 70–99)
Glucose, Bld: 148 mg/dL — ABNORMAL HIGH (ref 70–99)
Glucose, Bld: 238 mg/dL — ABNORMAL HIGH (ref 70–99)
Potassium: 4.2 mmol/L (ref 3.5–5.1)
Potassium: 3.6 mmol/L (ref 3.5–5.1)
Potassium: 3.5 mmol/L (ref 3.5–5.1)
Sodium: 140 mmol/L (ref 135–145)
Sodium: 145 mmol/L (ref 135–145)
Sodium: 144 mmol/L (ref 135–145)

## 2018-12-27 LAB — BLOOD GAS, ARTERIAL
Acid-base deficit: 11.8 mmol/L — ABNORMAL HIGH (ref 0.0–2.0)
Bicarbonate: 11 mmol/L — ABNORMAL LOW (ref 20.0–28.0)
FIO2: 0.21
O2 Saturation: 95.3 %
Patient temperature: 37
pCO2 arterial: 19 mmHg — CL (ref 32.0–48.0)
pH, Arterial: 7.37 (ref 7.350–7.450)
pO2, Arterial: 80 mmHg — ABNORMAL LOW (ref 83.0–108.0)

## 2018-12-27 LAB — GLUCOSE, CAPILLARY
Glucose-Capillary: 327 mg/dL — ABNORMAL HIGH (ref 70–99)
Glucose-Capillary: 266 mg/dL — ABNORMAL HIGH (ref 70–99)
Glucose-Capillary: 264 mg/dL — ABNORMAL HIGH (ref 70–99)
Glucose-Capillary: 261 mg/dL — ABNORMAL HIGH (ref 70–99)
Glucose-Capillary: 233 mg/dL — ABNORMAL HIGH (ref 70–99)
Glucose-Capillary: 175 mg/dL — ABNORMAL HIGH (ref 70–99)
Glucose-Capillary: 134 mg/dL — ABNORMAL HIGH (ref 70–99)
Glucose-Capillary: 123 mg/dL — ABNORMAL HIGH (ref 70–99)
Glucose-Capillary: 128 mg/dL — ABNORMAL HIGH (ref 70–99)
Glucose-Capillary: 145 mg/dL — ABNORMAL HIGH (ref 70–99)
Glucose-Capillary: 192 mg/dL — ABNORMAL HIGH (ref 70–99)
Glucose-Capillary: 216 mg/dL — ABNORMAL HIGH (ref 70–99)
Glucose-Capillary: 229 mg/dL — ABNORMAL HIGH (ref 70–99)
Glucose-Capillary: 167 mg/dL — ABNORMAL HIGH (ref 70–99)

## 2018-12-27 LAB — URINALYSIS, COMPLETE (UACMP) WITH MICROSCOPIC
Bilirubin Urine: NEGATIVE
Glucose, UA: >=500 mg/dL — AB
Ketones, ur: 20 mg/dL — AB
Nitrite: NEGATIVE
Protein, ur: 100 mg/dL — AB
Specific Gravity, Urine: 1.017 (ref 1.005–1.030)
WBC, UA: >50 WBC/hpf — ABNORMAL HIGH (ref 0–5)
pH: 7 (ref 5.0–8.0)

## 2018-12-27 LAB — BLOOD GAS, VENOUS

## 2018-12-27 LAB — CBC
HCT: 36.4 % (ref 36.0–46.0)
Hemoglobin: 12.9 g/dL (ref 12.0–15.0)
MCH: 30.4 pg (ref 26.0–34.0)
MCHC: 35.4 g/dL (ref 30.0–36.0)
MCV: 85.6 fL (ref 80.0–100.0)
Platelets: 170 10*3/uL (ref 150–400)
RBC: 4.25 MIL/uL (ref 3.87–5.11)
RDW: 12.6 % (ref 11.5–15.5)
WBC: 12.9 10*3/uL — ABNORMAL HIGH (ref 4.0–10.5)
nRBC: 0 % (ref 0.0–0.2)

## 2018-12-27 LAB — LIPASE, BLOOD: Lipase: 164 U/L — ABNORMAL HIGH (ref 11–51)

## 2018-12-27 LAB — NOVEL CORONAVIRUS, NAA (HOSP ORDER, SEND-OUT TO REF LAB; TAT 18-24 HRS): SARS-CoV-2, NAA: NOT DETECTED

## 2018-12-27 LAB — MAGNESIUM: Magnesium: 2.3 mg/dL (ref 1.7–2.4)

## 2018-12-27 LAB — BETA-HYDROXYBUTYRIC ACID: Beta-Hydroxybutyric Acid: 5 mmol/L — ABNORMAL HIGH (ref 0.05–0.27)

## 2018-12-27 MED ORDER — DEXMEDETOMIDINE HCL IN NACL 400 MCG/100ML IV SOLN
0.4000 ug/kg/h | INTRAVENOUS | Status: DC
  Administered 2018-12-27: 0.2 ug/kg/h via INTRAVENOUS
  Administered 2018-12-28: 1.2 ug/kg/h via INTRAVENOUS
  Administered 2018-12-28: 0.8 ug/kg/h via INTRAVENOUS
  Administered 2018-12-28: 1.2 ug/kg/h via INTRAVENOUS
  Administered 2018-12-28: 0.8 ug/kg/h via INTRAVENOUS
  Administered 2018-12-28: 1 ug/kg/h via INTRAVENOUS
  Administered 2018-12-29: 0.7 ug/kg/h via INTRAVENOUS
  Filled 2018-12-27 (×7): qty 100

## 2018-12-27 MED ORDER — POTASSIUM CHLORIDE 10 MEQ/100ML IV SOLN
10.0000 meq | INTRAVENOUS | Status: AC
  Administered 2018-12-28 (×2): 10 meq via INTRAVENOUS
  Filled 2018-12-27 (×3): qty 100

## 2018-12-27 MED ORDER — STERILE WATER FOR INJECTION IV SOLN
INTRAVENOUS | Status: DC
  Administered 2018-12-27: 13:00:00 via INTRAVENOUS
  Filled 2018-12-27 (×7): qty 850

## 2018-12-27 MED ORDER — SODIUM CHLORIDE 0.9 % IV SOLN
1.0000 g | Freq: Every day | INTRAVENOUS | Status: DC
  Filled 2018-12-27: qty 10

## 2018-12-27 MED ORDER — LACTATED RINGERS IV BOLUS
500.0000 mL | Freq: Once | INTRAVENOUS | Status: AC
  Administered 2018-12-27: 500 mL via INTRAVENOUS

## 2018-12-27 MED ORDER — LACTATED RINGERS IV BOLUS
1000.0000 mL | Freq: Once | INTRAVENOUS | Status: AC
  Administered 2018-12-27: 1000 mL via INTRAVENOUS

## 2018-12-27 MED ORDER — FAMOTIDINE IN NACL 20-0.9 MG/50ML-% IV SOLN
20.0000 mg | Freq: Every day | INTRAVENOUS | Status: DC
  Administered 2018-12-27 – 2018-12-28 (×2): 20 mg via INTRAVENOUS
  Filled 2018-12-27 (×2): qty 50

## 2018-12-27 MED ORDER — ENOXAPARIN SODIUM 40 MG/0.4ML ~~LOC~~ SOLN
40.0000 mg | SUBCUTANEOUS | Status: DC
  Administered 2018-12-28 – 2018-12-30 (×3): 40 mg via SUBCUTANEOUS
  Filled 2018-12-27 (×3): qty 0.4

## 2018-12-27 MED ORDER — HALOPERIDOL LACTATE 5 MG/ML IJ SOLN
2.0000 mg | Freq: Once | INTRAMUSCULAR | Status: AC
  Administered 2018-12-27: 2 mg via INTRAVENOUS

## 2018-12-27 MED ORDER — SODIUM CHLORIDE 0.9 % IV SOLN
INTRAVENOUS | Status: DC
  Administered 2018-12-30: 04:00:00 200 mL via INTRAVENOUS

## 2018-12-27 MED ORDER — SODIUM CHLORIDE 0.9 % IV SOLN
INTRAVENOUS | Status: AC
  Administered 2018-12-27: 10:00:00 via INTRAVENOUS

## 2018-12-27 MED ORDER — POTASSIUM CHLORIDE 10 MEQ/100ML IV SOLN
10.0000 meq | INTRAVENOUS | Status: AC
  Administered 2018-12-27 (×2): 10 meq via INTRAVENOUS
  Filled 2018-12-27 (×2): qty 100

## 2018-12-27 MED ORDER — IPRATROPIUM-ALBUTEROL 0.5-2.5 (3) MG/3ML IN SOLN
3.0000 mL | RESPIRATORY_TRACT | Status: DC | PRN
  Filled 2018-12-27: qty 3

## 2018-12-27 MED ORDER — INSULIN REGULAR(HUMAN) IN NACL 100-0.9 UT/100ML-% IV SOLN
INTRAVENOUS | Status: DC
  Administered 2018-12-27: 2 [IU]/h via INTRAVENOUS

## 2018-12-27 MED ORDER — DEXTROSE-NACL 5-0.45 % IV SOLN
INTRAVENOUS | Status: DC
  Administered 2018-12-27: 10:00:00 via INTRAVENOUS

## 2018-12-27 MED ORDER — THIAMINE HCL 100 MG/ML IJ SOLN
INTRAVENOUS | Status: DC
  Administered 2018-12-27 – 2018-12-28 (×2): via INTRAVENOUS
  Filled 2018-12-27 (×4): qty 1000

## 2018-12-27 MED ORDER — SODIUM CHLORIDE 0.9 % IV SOLN
2.0000 g | Freq: Every day | INTRAVENOUS | Status: DC
  Administered 2018-12-27 – 2018-12-29 (×3): 2 g via INTRAVENOUS
  Filled 2018-12-27 (×3): qty 2

## 2018-12-27 MED ORDER — PYRIDOXINE HCL 100 MG/ML IJ SOLN
50.0000 mg | Freq: Every day | INTRAMUSCULAR | Status: DC
  Administered 2018-12-27 – 2018-12-28 (×2): 50 mg via INTRAVENOUS
  Filled 2018-12-27 (×3): qty 0.5

## 2018-12-27 MED ORDER — FENTANYL CITRATE (PF) 100 MCG/2ML IJ SOLN
50.0000 ug | INTRAMUSCULAR | Status: DC | PRN
  Administered 2018-12-27 – 2018-12-28 (×3): 50 ug via INTRAVENOUS
  Filled 2018-12-27 (×4): qty 2

## 2018-12-27 NOTE — Progress Notes (Signed)
Pharmacy Electrolyte Monitoring Consult:  Pharmacy consulted to assist in monitoring and replacing electrolytes in this 58 y.o. female admitted on 12/26/2018 with Hyperglycemia   Labs:  Sodium (mmol/L)  Date Value  12/27/2018 145   Potassium (mmol/L)  Date Value  12/27/2018 3.6   Magnesium (mg/dL)  Date Value  12/27/2018 2.3   Phosphorus (mg/dL)  Date Value  12/26/2018 3.0   Calcium (mg/dL)  Date Value  12/27/2018 8.4 (L)   Albumin (g/dL)  Date Value  12/26/2018 4.1    Assessment/Plan: Patient is back on insulin drip.   Pt K = 3.6 - will give KCL IV 42meq x 2  Will monitor BMPs Q4hr until patient converts off of insulin infusion.   Will replace to maintain potassium >/= 4 while insulin infusion.   Pharmacy will continue to monitor and adjust per consult.   Lu Duffel, PharmD, BCPS Clinical Pharmacist 12/27/2018 7:01 PM

## 2018-12-27 NOTE — Progress Notes (Signed)
Pt anxious , agitated. Moaning and yelling loudly. Unable to follow commands. Bath, pt care complete. Foley catheter inserted as ordered. Precedex restarted as ordered. See MAR for medication details.

## 2018-12-27 NOTE — Progress Notes (Signed)
Scanned bilateral arms with [place], no suitable vein to insert a midline, veins are deep and small. Also patient is agitated, unable to lay still. RN at bedside and aware re: this matter.  Sandie Ano RN IV VAST team

## 2018-12-27 NOTE — Progress Notes (Signed)
[place] [facility] [place] NAME: Pamela Lane    MR#:  [id number]  DATE OF BIRTH:  [birth year]  SUBJECTIVE:  CHIEF COMPLAINT:   Chief Complaint  Patient presents with  . Hyperglycemia  Patient seen today On precedex drip Unable to give any history  REVIEW OF SYSTEMS:    ROS  Could not be obtained as patient is on precedex drip  DRUG ALLERGIES:  No Known Allergies  VITALS:  Blood pressure 130/62, pulse (!) 109, temperature 98.4 F (36.9 C), temperature source Axillary, resp. rate (!) 23, height 5\' 6"  (1.676 m), weight 91.3 kg, SpO2 98 %.  PHYSICAL EXAMINATION:   Physical Exam  GENERAL:  58 y.o.-year-old patient lying in the bed  EYES: Pupils equal, round, reactive to light and accommodation. No scleral icterus. Extraocular muscles intact.  HEENT: Head atraumatic, normocephalic. Oropharynx and nasopharynx clear.  NECK:  Supple, no jugular venous distention. No thyroid enlargement, no tenderness.  LUNGS: Normal breath sounds bilaterally, no wheezing, rales, rhonchi. No use of accessory muscles of respiration.  CARDIOVASCULAR: S1, S2 normal. No murmurs, rubs, or gallops.  ABDOMEN: Soft, nontender, nondistended. Bowel sounds present. No organomegaly or mass.  EXTREMITIES: No cyanosis, clubbing or edema b/l.    NEUROLOGIC: Lethargic and sleepy Moves all extremities PSYCHIATRIC: Could not be assessed SKIN: No obvious rash, lesion, or ulcer.   LABORATORY PANEL:   CBC Recent Labs  Lab 12/27/18 0436  WBC 12.9*  HGB 12.9  HCT 36.4  PLT 170   ------------------------------------------------------------------------------------------------------------------ Chemistries  Recent Labs  Lab 12/26/18 0128  12/27/18 0436  NA 128*   < > 140  K 5.7*   < > 4.2  CL 91*   < > 115*  CO2 <7*   < > 11*  GLUCOSE 1,081*   < > 364*  BUN 46*   < > 40*  CREATININE 2.74*   < > 1.82*  CALCIUM 9.7   < > 8.9  MG  --    < > 2.3  AST 14*  --   --    ALT 12  --   --   ALKPHOS 108  --   --   BILITOT 1.4*  --   --    < > = values in this interval not displayed.   ------------------------------------------------------------------------------------------------------------------  Cardiac Enzymes Recent Labs  Lab 12/26/18 0627  TROPONINI 0.04*   ------------------------------------------------------------------------------------------------------------------  RADIOLOGY:  Ct Abdomen Pelvis W Contrast  Result Date: 12/26/2018 CLINICAL DATA:  58 y/o F; hyperglycemia and abdominal pain. History of diabetes. EXAM: CT ABDOMEN AND PELVIS WITH CONTRAST TECHNIQUE: Multidetector CT imaging of the abdomen and pelvis was performed using the standard protocol following bolus administration of intravenous contrast. CONTRAST:  125mL OMNIPAQUE IOHEXOL 300 MG/ML  SOLN COMPARISON:  03/15/2018 MRI of the abdomen. 03/04/2018 CT abdomen and pelvis. FINDINGS: Lower chest: No acute abnormality. Hepatobiliary: No focal liver abnormality is seen. No gallstones, gallbladder wall thickening, or biliary dilatation. Pancreas: Edema surrounds the pancreas diffusely. No discrete acute peripancreatic collection. No main duct dilatation of the pancreas. Pancreas homogeneously enhances. Spleen: Normal in size without focal abnormality. Adrenals/Urinary Tract: Adrenal glands are unremarkable. Kidneys are normal, without renal calculi, focal lesion, or hydronephrosis. Bladder is unremarkable. Stomach/Bowel: Stomach is within normal limits. Appendix appears normal. No evidence of bowel wall thickening, distention, or inflammatory changes. Vascular/Lymphatic: Aortic atherosclerosis. No enlarged abdominal or pelvic lymph nodes. Reproductive: Status post hysterectomy. No adnexal masses. Other: No abdominal wall hernia or abnormality.  No abdominopelvic ascites. Musculoskeletal: No fracture is seen. Interval resection of right flank fatty mass. Moderate loss of height of the L5-S1  intervertebral disc space. IMPRESSION: Extensive edema surrounding the pancreas compatible with acute pancreatitis. No findings of pancreatic necrosis or acute peripancreatic collection at this time. Electronically Signed   By: Mitzi HansenLance  Furusawa-Stratton M.D.   On: 12/26/2018 02:46     ASSESSMENT AND PLAN:  58 year old female patient with history of diabetes mellitus, hypertension, bronchial asthma currently in the stepdown unit  -Acute diabetic ketoacidosis IV insulin drip Monitor blood sugars closely Monitor electrolytes NPO for now Intensivist f/u  -Delirium tremens Wean precedex drip  -Hyponatremia IV fluid hydration Probably secondary to hyperglycemia  -Acute pancreatitis No evidence of necrosis on CT scan N.p.o. for now IV fluids ACE inhibitor on hold F/U Lipase levels  -DVT prophylaxis subcu Lovenox daily  -Hyperkalemia Imroved  All the records are reviewed and case discussed with Care Management/Social Worker. Management plans discussed with the patient, family and they are in agreement.  CODE STATUS: Full code  DVT Prophylaxis: SCDs  TOTAL CRITICAL CARE TIME TAKING CARE OF THIS PATIENT: 34 minutes.   POSSIBLE D/C IN 2 to 3 DAYS, DEPENDING ON CLINICAL CONDITION.  Ihor AustinPavan Pyreddy M.D on 12/27/2018 at 10:48 AM  Between 7am to 6pm - Pager - [phone]  After 6pm go to [email or web address] - password [email or web address] [facility]  [facility] [place]  Office  [phone]  CC: Primary care physician; Center, [facility]  Note: This dictation was prepared with Nurse, children'sDragon dictation along with smaller phrase technology. Any transcriptional errors that result from this process are unintentional.

## 2018-12-27 NOTE — Progress Notes (Signed)
Follow up - Critical Care Medicine Note  Patient Details:    Pamela Lane is an 58 y.o. female with a history of ethanol and cocaine abuse, who presented with DKA and encephalopathy.  Appears also to have DTs.  Admitted to stepdown for management of the same.  Lines, Airways, Drains: Urethral Catheter [name], RN 14 Fr. (Active)  Site Assessment Clean;Intact 12/27/18 1500  Catheter Maintenance Bag below level of bladder;Catheter secured;Drainage bag/tubing not touching floor;No dependent loops;Seal intact;Bag emptied prior to transport 12/27/18 1500  Collection Container Standard drainage bag 12/27/18 1500  Input (mL) 250 mL 12/27/18 1800    Anti-infectives:  Anti-infectives (From admission, onward)   Start     Dose/Rate Route Frequency Ordered Stop   12/27/18 1345  cefTRIAXone (ROCEPHIN) 1 g in sodium chloride 0.9 % 100 mL IVPB  Status:  Discontinued     1 g 200 mL/hr over 30 Minutes Intravenous Daily 12/27/18 1335 12/27/18 1336   12/27/18 1345  cefTRIAXone (ROCEPHIN) 2 g in sodium chloride 0.9 % 100 mL IVPB     2 g 200 mL/hr over 30 Minutes Intravenous Daily 12/27/18 1336        Microbiology: Results for orders placed or performed during the hospital encounter of 12/26/18  Novel Coronavirus,NAA,(SEND-OUT TO REF LAB - TAT 24-48 hrs); Hosp Order     Status: None   Collection Time: 12/26/18  3:26 AM   Specimen: Nasopharyngeal Swab; Respiratory  Result Value Ref Range Status   SARS-CoV-2, NAA NOT DETECTED NOT DETECTED Final    Comment: (NOTE) This test was developed and its performance characteristics determined by [facility]. This test has not been FDA cleared or approved. This test has been authorized by FDA under an Emergency Use Authorization (EUA). This test is only authorized for the duration of time the declaration that circumstances exist justifying the authorization of the emergency use of in vitro diagnostic tests for detection of SARS-CoV-2 virus and/or  diagnosis of COVID-19 infection under section 564(b)(1) of the Act, 21 U.S.C. 161WRU-0(A)(5360bbb-3(b)(1), unless the authorization is terminated or revoked sooner. When diagnostic testing is negative, the possibility of a false negative result should be considered in the context of a patient's recent exposures and the presence of clinical signs and symptoms consistent with COVID-19. An individual without symptoms of COVID-19 and who is not shedding SARS-CoV-2 virus would expect to have a negative (not detected) result in this assay. Performed  At: [facility] [address] [place], [place] [id number] Jolene SchimkeNagendra Sanjai MD NW:[phone]:[phone]    Coronavirus Source NASOPHARYNGEAL  Final    Comment: Performed at [facility], [address] [place], [place] [address]  MRSA PCR Screening     Status: None   Collection Time: 12/26/18  4:12 AM   Specimen: Nasopharyngeal  Result Value Ref Range Status   MRSA by PCR NEGATIVE NEGATIVE Final    Comment:        The GeneXpert MRSA Assay (FDA approved for NASAL specimens only), is one component of a comprehensive MRSA colonization surveillance program. It is not intended to diagnose MRSA infection nor to guide or monitor treatment for MRSA infections. Performed at [facility], [address] [place], [place] [address]   Culture, blood (routine x 2)     Status: None (Preliminary result)   Collection Time: 12/26/18  6:27 AM   Specimen: BLOOD  Result Value Ref Range Status   Specimen Description BLOOD RIGHT ANTECUBITAL  Final   Special Requests   Final  BOTTLES DRAWN AEROBIC AND ANAEROBIC Blood Culture adequate volume   Culture   Final    NO GROWTH 1 DAY Performed at [facility], [address] [place], [place] [address]    Report Status PENDING  Incomplete  Culture, blood (routine x 2)     Status: None (Preliminary result)   Collection Time: 12/26/18  6:38 AM   Specimen: BLOOD  Result Value Ref Range Status    Specimen Description BLOOD BLOOD RIGHT FOREARM  Final   Special Requests   Final    BOTTLES DRAWN AEROBIC AND ANAEROBIC Blood Culture adequate volume   Culture   Final    NO GROWTH 1 DAY Performed at [facility], [address] [place], [place] [address]    Report Status PENDING  Incomplete    Best Practice/Protocols:  VTE Prophylaxis: Lovenox (prophylaxtic dose) GI Prophylaxis: Antihistamine CIWA  Hyperglycemia: DKA protocol  Events:   Studies: Ct Abdomen Pelvis W Contrast  Result Date: 12/26/2018 CLINICAL DATA:  58 y/o F; hyperglycemia and abdominal pain. History of diabetes. EXAM: CT ABDOMEN AND PELVIS WITH CONTRAST TECHNIQUE: Multidetector CT imaging of the abdomen and pelvis was performed using the standard protocol following bolus administration of intravenous contrast. CONTRAST:  100mL OMNIPAQUE IOHEXOL 300 MG/ML  SOLN COMPARISON:  03/15/2018 MRI of the abdomen. 03/04/2018 CT abdomen and pelvis. FINDINGS: Lower chest: No acute abnormality. Hepatobiliary: No focal liver abnormality is seen. No gallstones, gallbladder wall thickening, or biliary dilatation. Pancreas: Edema surrounds the pancreas diffusely. No discrete acute peripancreatic collection. No main duct dilatation of the pancreas. Pancreas homogeneously enhances. Spleen: Normal in size without focal abnormality. Adrenals/Urinary Tract: Adrenal glands are unremarkable. Kidneys are normal, without renal calculi, focal lesion, or hydronephrosis. Bladder is unremarkable. Stomach/Bowel: Stomach is within normal limits. Appendix appears normal. No evidence of bowel wall thickening, distention, or inflammatory changes. Vascular/Lymphatic: Aortic atherosclerosis. No enlarged abdominal or pelvic lymph nodes. Reproductive: Status post hysterectomy. No adnexal masses. Other: No abdominal wall hernia or abnormality. No abdominopelvic ascites. Musculoskeletal: No fracture is seen. Interval resection of right flank fatty mass.  Moderate loss of height of the L5-S1 intervertebral disc space. IMPRESSION: Extensive edema surrounding the pancreas compatible with acute pancreatitis. No findings of pancreatic necrosis or acute peripancreatic collection at this time. Electronically Signed   By: Mitzi HansenLance  Furusawa-Stratton M.D.   On: 12/26/2018 02:46    Consults: Treatment Team:  [facility], [facility]   Subjective:    Overnight Issues: Continues to require significant amounts of benzodiazepine for withdrawal type symptoms.  Developed recurrent acidosis last night and now has required resumption of insulin infusion.  She has become quite agitated and appears to be writhing in pain.  Has developed some urinary retention.  Required placement of Foley which produced turbid foul-smelling urine.  Objective:  Vital signs for last 24 hours: Temp:  [97.6 F (36.4 C)-98.4 F (36.9 C)] 97.6 F (36.4 C) (06/17 1945) Pulse Rate:  [97-115] 97 (06/17 1945) Resp:  [22-35] 35 (06/17 1945) BP: (97-154)/(44-105) 97/47 (06/17 1945) SpO2:  [96 %-100 %] 100 % (06/17 1945)  Hemodynamic parameters for last 24 hours:    Intake/Output from previous day: 06/16 0701 - 06/17 0700 In: 3236.7 [I.V.:2585.1; IV Piggyback:651.6] Out: 1200 [Urine:1200]  Intake/Output this shift: No intake/output data recorded.  Vent settings for last 24 hours:    Physical Exam:  General: well developed, obese female, confused, agitated appears to be in pain cannot specify where.  Somewhat tachypneic Neuro:confused, agitated, will not follow commands.  HEENT: supple, no JVD, trachea midline, no stridor. Oral mucosa dry Cardiovascular:  Tachycardic, regular rate, no M/R/G  Lungs: clear throughout, even, non labored  Abdomen: +BS x4, obese, soft, tender  at epigastrium Musculoskeletal: normal bulk and tone, no edema  Skin: intact no rashes or lesions present   Assessment/Plan:   Diabetic ketoacidosis, recurrent acidosis with compensatory respiratory  alkalosis Resume insulin gtt until anion gap closed and serum CO2 >20 Will require low-dose bicarb drip due to associated tachypnea and respiratory distress due to acidosis CBG q1hr and BMP q4hrs while on insulin gtt  Query element of alcoholic ketoacidosis as well, supplement thiamine IV as has not been reliable taking p.o.   Acute renal failure likely secondary to volume depletion in setting of DKA  Metabolic derangements due to DKA Trend BMP Replace electrolytes as indicated  Monitor UOP  Avoid nephrotoxic medications Received volume challenge x2 L of crystalloid today Low-dose bicarb drip as buffer   Leukocytosis on downward trend  Urinary tract infection Urinalysis suggestive of UTI this precedes insertion of Foley catheter Urine culture Trend WBC and monitor fever curve  Start Rocephin COVID-19 results: NEGATIVE  Abdominal pain  Amylase was elevated on downward trend likely low-grade pancreatitis PRN fentanyl, avoid morphine due to effects on sphincter of Oddi  ETOH Abuse  Current everyday smoker  CIWA protocol initiated-ativan per protocol  PRN Haldol    LOS: 1 day   Additional comments: Discussed that ICU multidisciplinary rounds.  Critical Care Total Time*: 45 Minutes  C. Derrill Kay, MD [facility] 12/27/2018  *Care during the described time interval was provided by me and/or other providers on the critical care team.  I have reviewed this patient's available data, including medical history, events of note, physical examination and test results as part of my evaluation.

## 2018-12-27 NOTE — Progress Notes (Signed)
Pharmacy Electrolyte Monitoring Consult:  Pharmacy consulted to assist in monitoring and replacing electrolytes in this 58 y.o. female admitted on 12/26/2018 with Hyperglycemia   Labs:  Sodium (mmol/L)  Date Value  12/27/2018 144   Potassium (mmol/L)  Date Value  12/27/2018 3.5   Magnesium (mg/dL)  Date Value  12/27/2018 2.3   Phosphorus (mg/dL)  Date Value  12/26/2018 3.0   Calcium (mg/dL)  Date Value  12/27/2018 8.2 (L)   Albumin (g/dL)  Date Value  12/26/2018 4.1    Assessment/Plan: 06/17 @ 2130 will give another KCl 10 mEq IV x 2 for a total of 40 mEq IV since patient's renal function is improving. Will recheck electrolytes w/ am labs.  Pharmacy will continue to monitor and adjust per consult.   Tobie Lords, PharmD, BCPS Clinical Pharmacist 12/27/2018 10:16 PM

## 2018-12-27 NOTE — Progress Notes (Signed)
Pharmacy Electrolyte Monitoring Consult:  Pharmacy consulted to assist in monitoring and replacing electrolytes in this 58 y.o. female admitted on 12/26/2018 with Hyperglycemia   Labs:  Sodium (mmol/L)  Date Value  12/27/2018 140   Potassium (mmol/L)  Date Value  12/27/2018 4.2   Magnesium (mg/dL)  Date Value  12/27/2018 2.3   Phosphorus (mg/dL)  Date Value  12/26/2018 3.0   Calcium (mg/dL)  Date Value  12/27/2018 8.9   Albumin (g/dL)  Date Value  12/26/2018 4.1    Assessment/Plan: Patient is back on insulin drip.   Will monitor BMPs Q4hr until patient converts off of insulin infusion.   Will replace to maintain potassium >/= 4 while insulin infusion.   Pharmacy will continue to monitor and adjust per consult.   Amy Belloso L 12/27/2018 5:13 PM

## 2018-12-28 ENCOUNTER — Inpatient Hospital Stay: Payer: Medicaid Other

## 2018-12-28 DIAGNOSIS — Z01818 Encounter for other preprocedural examination: Secondary | ICD-10-CM

## 2018-12-28 DIAGNOSIS — I878 Other specified disorders of veins: Secondary | ICD-10-CM

## 2018-12-28 DIAGNOSIS — K852 Alcohol induced acute pancreatitis without necrosis or infection: Secondary | ICD-10-CM

## 2018-12-28 DIAGNOSIS — J9601 Acute respiratory failure with hypoxia: Secondary | ICD-10-CM

## 2018-12-28 DIAGNOSIS — E1110 Type 2 diabetes mellitus with ketoacidosis without coma: Principal | ICD-10-CM

## 2018-12-28 LAB — BLOOD GAS, ARTERIAL
Acid-base deficit: 10.7 mmol/L — ABNORMAL HIGH (ref 0.0–2.0)
Acid-base deficit: 6 mmol/L — ABNORMAL HIGH (ref 0.0–2.0)
Bicarbonate: 11 mmol/L — ABNORMAL LOW (ref 20.0–28.0)
Bicarbonate: 17.6 mmol/L — ABNORMAL LOW (ref 20.0–28.0)
FIO2: 0.32
FIO2: 0.4
MECHVT: 450 mL
Mechanical Rate: 24
O2 Saturation: 96.8 %
O2 Saturation: 94.9 %
PEEP: 5 cmH2O
Patient temperature: 37
Patient temperature: 37
RATE: 24 resp/min
pCO2 arterial: <19 mmHg — CL (ref 32.0–48.0)
pCO2 arterial: 29 mmHg — ABNORMAL LOW (ref 32.0–48.0)
pH, Arterial: 7.42 (ref 7.350–7.450)
pH, Arterial: 7.39 (ref 7.350–7.450)
pO2, Arterial: 87 mmHg (ref 83.0–108.0)
pO2, Arterial: 76 mmHg — ABNORMAL LOW (ref 83.0–108.0)

## 2018-12-28 LAB — CBC WITH DIFFERENTIAL/PLATELET
Abs Immature Granulocytes: 0.28 10*3/uL — ABNORMAL HIGH (ref 0.00–0.07)
Abs Immature Granulocytes: 0.19 10*3/uL — ABNORMAL HIGH (ref 0.00–0.07)
Basophils Absolute: 0 10*3/uL (ref 0.0–0.1)
Basophils Absolute: 0.1 10*3/uL (ref 0.0–0.1)
Basophils Relative: 0 %
Basophils Relative: 0 %
Eosinophils Absolute: 0 10*3/uL (ref 0.0–0.5)
Eosinophils Absolute: 0.1 10*3/uL (ref 0.0–0.5)
Eosinophils Relative: 0 %
Eosinophils Relative: 1 %
HCT: 31.3 % — ABNORMAL LOW (ref 36.0–46.0)
HCT: 32.9 % — ABNORMAL LOW (ref 36.0–46.0)
Hemoglobin: 11 g/dL — ABNORMAL LOW (ref 12.0–15.0)
Hemoglobin: 11.5 g/dL — ABNORMAL LOW (ref 12.0–15.0)
Immature Granulocytes: 3 %
Immature Granulocytes: 1 %
Lymphocytes Relative: 12 %
Lymphocytes Relative: 7 %
Lymphs Abs: 1.3 10*3/uL (ref 0.7–4.0)
Lymphs Abs: 1.1 10*3/uL (ref 0.7–4.0)
MCH: 30.2 pg (ref 26.0–34.0)
MCH: 30.7 pg (ref 26.0–34.0)
MCHC: 35.1 g/dL (ref 30.0–36.0)
MCHC: 35 g/dL (ref 30.0–36.0)
MCV: 86 fL (ref 80.0–100.0)
MCV: 88 fL (ref 80.0–100.0)
Monocytes Absolute: 1 10*3/uL (ref 0.1–1.0)
Monocytes Absolute: 1.4 10*3/uL — ABNORMAL HIGH (ref 0.1–1.0)
Monocytes Relative: 9 %
Monocytes Relative: 9 %
Neutro Abs: 8.5 10*3/uL — ABNORMAL HIGH (ref 1.7–7.7)
Neutro Abs: 12.6 10*3/uL — ABNORMAL HIGH (ref 1.7–7.7)
Neutrophils Relative %: 76 %
Neutrophils Relative %: 82 %
Platelets: 148 10*3/uL — ABNORMAL LOW (ref 150–400)
Platelets: 164 10*3/uL (ref 150–400)
RBC: 3.64 MIL/uL — ABNORMAL LOW (ref 3.87–5.11)
RBC: 3.74 MIL/uL — ABNORMAL LOW (ref 3.87–5.11)
RDW: 12.1 % (ref 11.5–15.5)
RDW: 13.4 % (ref 11.5–15.5)
Smear Review: NORMAL
Smear Review: NORMAL
WBC Morphology: INCREASED
WBC: 11.1 10*3/uL — ABNORMAL HIGH (ref 4.0–10.5)
WBC: 15.4 10*3/uL — ABNORMAL HIGH (ref 4.0–10.5)
nRBC: 0 % (ref 0.0–0.2)
nRBC: 0 % (ref 0.0–0.2)

## 2018-12-28 LAB — GLUCOSE, CAPILLARY
Glucose-Capillary: 124 mg/dL — ABNORMAL HIGH (ref 70–99)
Glucose-Capillary: 125 mg/dL — ABNORMAL HIGH (ref 70–99)
Glucose-Capillary: 127 mg/dL — ABNORMAL HIGH (ref 70–99)
Glucose-Capillary: 142 mg/dL — ABNORMAL HIGH (ref 70–99)
Glucose-Capillary: 145 mg/dL — ABNORMAL HIGH (ref 70–99)
Glucose-Capillary: 145 mg/dL — ABNORMAL HIGH (ref 70–99)
Glucose-Capillary: 151 mg/dL — ABNORMAL HIGH (ref 70–99)
Glucose-Capillary: 154 mg/dL — ABNORMAL HIGH (ref 70–99)
Glucose-Capillary: 158 mg/dL — ABNORMAL HIGH (ref 70–99)
Glucose-Capillary: 167 mg/dL — ABNORMAL HIGH (ref 70–99)
Glucose-Capillary: 187 mg/dL — ABNORMAL HIGH (ref 70–99)
Glucose-Capillary: 214 mg/dL — ABNORMAL HIGH (ref 70–99)
Glucose-Capillary: 222 mg/dL — ABNORMAL HIGH (ref 70–99)
Glucose-Capillary: 224 mg/dL — ABNORMAL HIGH (ref 70–99)
Glucose-Capillary: 231 mg/dL — ABNORMAL HIGH (ref 70–99)
Glucose-Capillary: 263 mg/dL — ABNORMAL HIGH (ref 70–99)

## 2018-12-28 LAB — COMPREHENSIVE METABOLIC PANEL
ALT: 11 U/L (ref 0–44)
AST: 20 U/L (ref 15–41)
Albumin: 2.4 g/dL — ABNORMAL LOW (ref 3.5–5.0)
Alkaline Phosphatase: 63 U/L (ref 38–126)
Anion gap: 13 (ref 5–15)
BUN: 29 mg/dL — ABNORMAL HIGH (ref 6–20)
CO2: 17 mmol/L — ABNORMAL LOW (ref 22–32)
Calcium: 8 mg/dL — ABNORMAL LOW (ref 8.9–10.3)
Chloride: 116 mmol/L — ABNORMAL HIGH (ref 98–111)
Creatinine, Ser: 1.45 mg/dL — ABNORMAL HIGH (ref 0.44–1.00)
GFR calc Af Amer: 46 mL/min — ABNORMAL LOW (ref >60)
GFR calc non Af Amer: 40 mL/min — ABNORMAL LOW (ref >60)
Glucose, Bld: 156 mg/dL — ABNORMAL HIGH (ref 70–99)
Potassium: 3.5 mmol/L (ref 3.5–5.1)
Sodium: 146 mmol/L — ABNORMAL HIGH (ref 135–145)
Total Bilirubin: 0.7 mg/dL (ref 0.3–1.2)
Total Protein: 5.6 g/dL — ABNORMAL LOW (ref 6.5–8.1)

## 2018-12-28 LAB — BASIC METABOLIC PANEL
Anion gap: 16 — ABNORMAL HIGH (ref 5–15)
Anion gap: 10 (ref 5–15)
BUN: 26 mg/dL — ABNORMAL HIGH (ref 6–20)
BUN: 22 mg/dL — ABNORMAL HIGH (ref 6–20)
CO2: 16 mmol/L — ABNORMAL LOW (ref 22–32)
CO2: 25 mmol/L (ref 22–32)
Calcium: 8.2 mg/dL — ABNORMAL LOW (ref 8.9–10.3)
Calcium: 7.6 mg/dL — ABNORMAL LOW (ref 8.9–10.3)
Chloride: 116 mmol/L — ABNORMAL HIGH (ref 98–111)
Chloride: 113 mmol/L — ABNORMAL HIGH (ref 98–111)
Creatinine, Ser: 1.58 mg/dL — ABNORMAL HIGH (ref 0.44–1.00)
Creatinine, Ser: 1.29 mg/dL — ABNORMAL HIGH (ref 0.44–1.00)
GFR calc Af Amer: 42 mL/min — ABNORMAL LOW (ref >60)
GFR calc Af Amer: 53 mL/min — ABNORMAL LOW (ref >60)
GFR calc non Af Amer: 36 mL/min — ABNORMAL LOW (ref >60)
GFR calc non Af Amer: 46 mL/min — ABNORMAL LOW (ref >60)
Glucose, Bld: 256 mg/dL — ABNORMAL HIGH (ref 70–99)
Glucose, Bld: 157 mg/dL — ABNORMAL HIGH (ref 70–99)
Potassium: 3.7 mmol/L (ref 3.5–5.1)
Potassium: 3.1 mmol/L — ABNORMAL LOW (ref 3.5–5.1)
Sodium: 148 mmol/L — ABNORMAL HIGH (ref 135–145)
Sodium: 148 mmol/L — ABNORMAL HIGH (ref 135–145)

## 2018-12-28 LAB — PROCALCITONIN: Procalcitonin: 1.92 ng/mL

## 2018-12-28 LAB — LACTIC ACID, PLASMA: Lactic Acid, Venous: 1 mmol/L (ref 0.5–1.9)

## 2018-12-28 LAB — MAGNESIUM: Magnesium: 2.2 mg/dL (ref 1.7–2.4)

## 2018-12-28 MED ORDER — SODIUM BICARBONATE 8.4 % IV SOLN
100.0000 meq | Freq: Once | INTRAVENOUS | Status: AC
  Administered 2018-12-28: 100 meq via INTRAVENOUS

## 2018-12-28 MED ORDER — ROCURONIUM BROMIDE 50 MG/5ML IV SOLN
50.0000 mg | Freq: Once | INTRAVENOUS | Status: AC
  Administered 2018-12-28: 50 mg via INTRAVENOUS

## 2018-12-28 MED ORDER — MIDAZOLAM HCL 2 MG/2ML IJ SOLN
2.0000 mg | INTRAMUSCULAR | Status: DC | PRN
  Administered 2018-12-28 (×2): 2 mg via INTRAVENOUS
  Filled 2018-12-28: qty 2

## 2018-12-28 MED ORDER — INSULIN GLARGINE 100 UNIT/ML ~~LOC~~ SOLN
18.0000 [IU] | Freq: Every day | SUBCUTANEOUS | Status: DC
  Administered 2018-12-28 – 2018-12-29 (×2): 18 [IU] via SUBCUTANEOUS
  Filled 2018-12-28 (×2): qty 0.18

## 2018-12-28 MED ORDER — ORAL CARE MOUTH RINSE
15.0000 mL | OROMUCOSAL | Status: DC
  Administered 2018-12-28 – 2018-12-30 (×13): 15 mL via OROMUCOSAL

## 2018-12-28 MED ORDER — FOLIC ACID 1 MG PO TABS
1.0000 mg | ORAL_TABLET | Freq: Every day | ORAL | Status: DC
  Administered 2018-12-28 – 2018-12-29 (×2): 1 mg
  Filled 2018-12-28 (×2): qty 1

## 2018-12-28 MED ORDER — VITAMIN B-1 100 MG PO TABS
100.0000 mg | ORAL_TABLET | Freq: Every day | ORAL | Status: DC
  Administered 2018-12-28 – 2018-12-29 (×2): 100 mg
  Filled 2018-12-28 (×2): qty 1

## 2018-12-28 MED ORDER — FAMOTIDINE 20 MG PO TABS
20.0000 mg | ORAL_TABLET | Freq: Every day | ORAL | Status: DC
  Administered 2018-12-29: 20 mg
  Filled 2018-12-28: qty 1

## 2018-12-28 MED ORDER — POTASSIUM CHLORIDE 20 MEQ PO PACK
40.0000 meq | PACK | ORAL | Status: AC
  Administered 2018-12-28 (×2): 40 meq
  Filled 2018-12-28 (×2): qty 2

## 2018-12-28 MED ORDER — DEXAMETHASONE SODIUM PHOSPHATE 4 MG/ML IJ SOLN
10.0000 mg | Freq: Once | INTRAMUSCULAR | Status: AC
  Administered 2018-12-28: 10 mg via INTRAVENOUS
  Filled 2018-12-28: qty 3

## 2018-12-28 MED ORDER — SODIUM CHLORIDE 0.9% FLUSH: 10.0000 mL | INTRAVENOUS | Status: DC | PRN

## 2018-12-28 MED ORDER — CHLORHEXIDINE GLUCONATE 0.12% ORAL RINSE (MEDLINE KIT)
15.0000 mL | Freq: Two times a day (BID) | OROMUCOSAL | Status: DC
  Administered 2018-12-28 – 2018-12-29 (×4): 15 mL via OROMUCOSAL

## 2018-12-28 MED ORDER — SENNOSIDES-DOCUSATE SODIUM 8.6-50 MG PO TABS: 2.0000 | ORAL_TABLET | Freq: Two times a day (BID) | ORAL | Status: DC

## 2018-12-28 MED ORDER — ETOMIDATE 2 MG/ML IV SOLN
INTRAVENOUS | Status: AC
  Filled 2018-12-28: qty 10

## 2018-12-28 MED ORDER — SODIUM BICARBONATE 8.4 % IV SOLN
INTRAVENOUS | Status: AC
  Administered 2018-12-28: 100 meq
  Filled 2018-12-28: qty 100

## 2018-12-28 MED ORDER — ADULT MULTIVITAMIN LIQUID CH
15.0000 mL | Freq: Every day | ORAL | Status: DC
  Administered 2018-12-29: 15 mL
  Filled 2018-12-28: qty 15

## 2018-12-28 MED ORDER — PANTOPRAZOLE SODIUM 40 MG IV SOLR: 40.0000 mg | Freq: Every day | INTRAVENOUS | Status: DC

## 2018-12-28 MED ORDER — INSULIN ASPART 100 UNIT/ML ~~LOC~~ SOLN
0.0000 [IU] | SUBCUTANEOUS | Status: DC
  Administered 2018-12-28: 3 [IU] via SUBCUTANEOUS
  Administered 2018-12-28: 8 [IU] via SUBCUTANEOUS
  Administered 2018-12-29 (×5): 5 [IU] via SUBCUTANEOUS
  Filled 2018-12-28 (×8): qty 1

## 2018-12-28 MED ORDER — MIDAZOLAM HCL 2 MG/2ML IJ SOLN
2.0000 mg | INTRAMUSCULAR | Status: DC | PRN
  Administered 2018-12-28 – 2018-12-29 (×2): 2 mg via INTRAVENOUS
  Filled 2018-12-28 (×3): qty 2

## 2018-12-28 MED ORDER — SENNOSIDES-DOCUSATE SODIUM 8.6-50 MG PO TABS
2.0000 | ORAL_TABLET | Freq: Two times a day (BID) | ORAL | Status: DC
  Administered 2018-12-28 – 2018-12-29 (×2): 2
  Filled 2018-12-28 (×2): qty 2

## 2018-12-28 MED ORDER — RACEPINEPHRINE HCL 2.25 % IN NEBU
0.5000 mL | INHALATION_SOLUTION | Freq: Once | RESPIRATORY_TRACT | Status: AC
  Administered 2018-12-28: 0.5 mL via RESPIRATORY_TRACT
  Filled 2018-12-28: qty 0.5

## 2018-12-28 MED ORDER — FENTANYL BOLUS VIA INFUSION
50.0000 ug | INTRAVENOUS | Status: DC | PRN
  Filled 2018-12-28: qty 50

## 2018-12-28 MED ORDER — ETOMIDATE 2 MG/ML IV SOLN
20.0000 mg | Freq: Once | INTRAVENOUS | Status: AC
  Administered 2018-12-28: 20 mg via INTRAVENOUS

## 2018-12-28 MED ORDER — FENTANYL CITRATE (PF) 100 MCG/2ML IJ SOLN
100.0000 ug | Freq: Once | INTRAMUSCULAR | Status: AC
  Administered 2018-12-28: 06:00:00 100 ug via INTRAVENOUS

## 2018-12-28 MED ORDER — VITAMIN B-6 50 MG PO TABS
50.0000 mg | ORAL_TABLET | Freq: Every day | ORAL | Status: DC
  Administered 2018-12-29: 50 mg
  Filled 2018-12-28: qty 1

## 2018-12-28 MED ORDER — SODIUM CHLORIDE 0.9% FLUSH
10.0000 mL | Freq: Two times a day (BID) | INTRAVENOUS | Status: DC
  Administered 2018-12-28 – 2018-12-30 (×4): 10 mL

## 2018-12-28 MED ORDER — ADULT MULTIVITAMIN LIQUID CH
15.0000 mL | Freq: Every day | ORAL | Status: DC
  Filled 2018-12-28: qty 15

## 2018-12-28 MED ORDER — FENTANYL CITRATE (PF) 100 MCG/2ML IJ SOLN: 50.0000 ug | Freq: Once | INTRAMUSCULAR | Status: DC

## 2018-12-28 MED ORDER — NOREPINEPHRINE BITARTRATE 1 MG/ML IV SOLN
0.0000 ug/min | INTRAVENOUS | Status: DC
  Administered 2018-12-28: 10 ug/min via INTRAVENOUS
  Filled 2018-12-28: qty 16

## 2018-12-28 MED ORDER — ROCURONIUM BROMIDE 50 MG/5ML IV SOLN
INTRAVENOUS | Status: AC
  Filled 2018-12-28: qty 1

## 2018-12-28 MED ORDER — FENTANYL 2500MCG IN NS 250ML (10MCG/ML) PREMIX INFUSION
50.0000 ug/h | INTRAVENOUS | Status: DC
  Administered 2018-12-28: 200 ug/h via INTRAVENOUS
  Administered 2018-12-28: 100 ug/h via INTRAVENOUS
  Administered 2018-12-29: 175 ug/h via INTRAVENOUS
  Filled 2018-12-28 (×3): qty 250

## 2018-12-28 NOTE — Procedures (Signed)
Intubation Procedure Note Pamela Lane [id number] March 30, 1961  Procedure: Intubation Indications: Respiratory insufficiency  Procedure Details Consent: Unable to obtain consent because of emergent medical necessity. Time Out: Verified patient identification, verified procedure, site/side was marked, verified correct patient position, special equipment/implants available, medications/allergies/relevent history reviewed, required imaging and test results available.  Performed  Maximum sterile technique was used including gloves, gown, hand hygiene and mask.  4    Evaluation Hemodynamic Status: BP stable throughout; O2 sats: stable throughout and currently acceptable Patient's Current Condition: stable Complications: No apparent complications Patient did tolerate procedure well. Chest X-ray ordered to verify placement.  CXR: pending.  Glidescope was for direct visualization of passage of ETT through the vocal cords.  Condensation was noted in ETT, Color change noted on CO2 detector, and bilateral breath sounds auscultated.  Tube secured at 22cm at the lip.  Rapid sequence intubation performed with 100 mcg fentanyl, 20 mg Etomidate, and 50 mg Rocuronium.   Pamela Lane, AGACNP-BC [facility] Pager: [phone] Cell: [phone]   [name] 12/28/2018

## 2018-12-28 NOTE — Progress Notes (Signed)
Patient with continued metabolic acidosis with compensatory respiratory alkalosis (as noted on ABG) despite insulin drip for DKA and Bicarb drip.  Patient's respiratory rate remains in the mid to upper 30's.  Have increased bicarb infusion and have given two additional bicarb pushes with minimal improvement in her metabolic acidosis.  Given patient's work of breathing secondary to respiratory compensation, concern for impending respiratory arrest.  We will proceed with intubation.  Have called and updated pt's significant other Thurmon Fair about her status and need for intubation.  Mr. Hubert Azure is in agreement with proceeding with intubation.   Darel Hong, AGACNP-BC [place] [facility] Pager: [phone] Cell: [phone]

## 2018-12-28 NOTE — Progress Notes (Signed)
[employer] - [place] [facility]   PATIENT NAME: Pamela Lane    MR#:  [id number]  DATE OF BIRTH:  08/10/1960  SUBJECTIVE:  CHIEF COMPLAINT:   Chief Complaint  Patient presents with  . Hyperglycemia  Patient seen today Intubated on ventilator [place] volume 450 FiO2 40% PEEP 5 Rate 24  REVIEW OF SYSTEMS:    ROS  Could not be obtained as patient on ventilator  DRUG ALLERGIES:  No Known Allergies  VITALS:  Blood pressure 130/76, pulse 77, temperature 98.4 F (36.9 C), temperature source Oral, resp. rate (!) 24, height 5\' 6"  (1.676 m), weight 91.3 kg, SpO2 97 %.  PHYSICAL EXAMINATION:   Physical Exam  GENERAL:  58 y.o.-year-old patient lying in the bed  EYES: Pupils equal, round, reactive to light and accommodation. No scleral icterus. Extraocular muscles intact.  HEENT: Head atraumatic, normocephalic. Oropharynx : Endotracheal tube noted NECK:  Supple, no jugular venous distention. No thyroid enlargement, no tenderness.  LUNGS: Adequate air flow in both lungs On ventilator CARDIOVASCULAR: S1, S2 normal. No murmurs, rubs, or gallops.  ABDOMEN: Soft, nontender, nondistended. Bowel sounds present. No organomegaly or mass.  EXTREMITIES: No cyanosis, clubbing or edema b/l.    NEUROLOGIC: could not be assesed PSYCHIATRIC: Could not be assessed SKIN: No obvious rash, lesion, or ulcer.   LABORATORY PANEL:   CBC Recent Labs  Lab 12/28/18 0430  WBC 15.4*  HGB 11.5*  HCT 32.9*  PLT 164   ------------------------------------------------------------------------------------------------------------------ Chemistries  Recent Labs  Lab 12/28/18 0059  12/28/18 1216  NA 146*   < > 148*  K 3.5   < > 3.1*  CL 116*   < > 113*  CO2 17*   < > 25  GLUCOSE 156*   < > 157*  BUN 29*   < > 22*  CREATININE 1.45*   < > 1.29*  CALCIUM 8.0*   < > 7.6*  MG  --   --  2.2  AST 20  --   --   ALT 11  --   --   ALKPHOS 63  --   --   BILITOT 0.7  --   --    <  > = values in this interval not displayed.   ------------------------------------------------------------------------------------------------------------------  Cardiac Enzymes Recent Labs  Lab 12/26/18 0627  TROPONINI 0.04*   ------------------------------------------------------------------------------------------------------------------  RADIOLOGY:  Ct Abdomen Pelvis Wo Contrast  Result Date: 12/28/2018 CLINICAL DATA:  Acute pancreatitis, SIRS, leukocytosis, fever EXAM: CT ABDOMEN AND PELVIS WITHOUT CONTRAST TECHNIQUE: Multidetector CT imaging of the abdomen and pelvis was performed following the standard protocol without IV contrast. Sagittal and coronal MPR images reconstructed from axial data set. No GI contrast administered. COMPARISON:  12/26/2018 FINDINGS: Lower chest: Bibasilar atelectasis and minimal pleural effusions. Hepatobiliary: New high-density material dependently within gallbladder since 12/26/2018 consistent with vicarious excretion of previously administered contrast. No patent mass lesion. Pancreas: Enlarged and ill-defined consistent with acute pancreatitis. Slightly increased peripancreatic edema. No discrete pancreatic mass or hemorrhage. No abnormal fluid collections. Spleen: Normal appearance.  Tiny splenule inferior to spleen Adrenals/Urinary Tract: Thickening of adrenal glands without discrete mass. Kidneys and ureters normal appearance. Foley catheter decompresses urinary bladder. Stomach/Bowel: Normal appendix. Nasogastric tube in stomach with, which is decompressed. Bowel loops otherwise unremarkable Vascular/Lymphatic: Atherosclerotic calcifications aorta and iliac arteries. RIGHT femoral line present tip at confluence of the RIGHT common iliac vein. No adenopathy. Reproductive: Uterus surgically absent. No definite ovaries visualized Other: Free fluid in pelvis, small volume.  No free air or hernia. Musculoskeletal: Osseous demineralization. No acute bony findings.  Chronic subchondral cystic and sclerotic changes at the RIGHT femoral head could reflect prior avascular necrosis or degenerative changes, unchanged since 05/11/2016. IMPRESSION: Acute pancreatitis changes with slightly increased peripancreatic edema since previous exam. No evidence of hemorrhage or abscess. Unable to assess vascular patency due to lack of IV contrast administration. Bibasilar atelectasis and minimal pleural effusions. Small amount of free fluid in pelvis. Electronically Signed   By: Lavonia Dana M.D.   On: 12/28/2018 11:24   Dg Chest 1 View  Result Date: 12/28/2018 CLINICAL DATA:  Intubation EXAM: CHEST  1 VIEW COMPARISON:  12/28/2018 FINDINGS: Endotracheal tube terminates 2.2 cm above the carina. Mild left lower lung opacity, progressive, favoring lingular pneumonia. Right lung is clear. No pleural effusion or pneumothorax. The heart is normal in size. IMPRESSION: Progressive left lower lung opacity, favoring lingular pneumonia. Endotracheal tube terminates 2.2 cm above the carina. Electronically Signed   By: Julian Hy M.D.   On: 12/28/2018 07:03   Dg Chest Port 1 View  Result Date: 12/28/2018 CLINICAL DATA:  Acute respiratory failure. EXAM: PORTABLE CHEST 1 VIEW COMPARISON:  Two-view chest x-ray 02/16/2018 FINDINGS: The heart size is normal. Minimal asymmetric airspace opacities are present at the left base. Lung volumes are low at this is there is no edema or effusion. No significant consolidation is present. The visualized soft tissues and bony thorax are unremarkable. IMPRESSION: 1. Low lung volumes with mild basilar airspace opacity on the left. While this likely reflects atelectasis, infection is not excluded. Electronically Signed   By: San Morelle M.D.   On: 12/28/2018 04:13   Dg Abd Portable 1v  Result Date: 12/28/2018 CLINICAL DATA:  Status post NG tube placement EXAM: PORTABLE ABDOMEN - 1 VIEW COMPARISON:  None. FINDINGS: The NG tube terminates in the  stomach.  No other abnormalities. IMPRESSION: The NG tube is in good position, terminating in the stomach. Electronically Signed   By: Dorise Bullion III M.D   On: 12/28/2018 10:45     ASSESSMENT AND PLAN:  58 year old female patient with history of diabetes mellitus, hypertension, bronchial asthma currently in the stepdown unit  -Acute hypoxic respiratory failure Continue mechanical ventilation  -Acute diabetic ketoacidosis IV insulin drip Monitor blood sugars closely Monitor electrolytes NPO for now Intensivist f/u  -Delirium tremens Sedation  -Hyponatremia IV fluid hydration Probably secondary to hyperglycemia  -Acute pancreatitis Follow-up CT abdomen N.p.o. for now IV fluids ACE inhibitor on hold F/U Lipase levels  -DVT prophylaxis subcu Lovenox daily  -Hypokalemia Replace potassium  All the records are reviewed and case discussed with Care Management/Social Worker. Management plans discussed with the patient, family and they are in agreement.  CODE STATUS: Full code  DVT Prophylaxis: SCDs  TOTAL CRITICAL CARE TIME TAKING CARE OF THIS PATIENT: 35 minutes.   POSSIBLE D/C IN 2 to 3 DAYS, DEPENDING ON CLINICAL CONDITION.  Saundra Shelling M.D on 12/28/2018 at 1:28 PM  Between 7am to 6pm - Pager - [phone]  After 6pm go to [email or web address] - password [email or web address]  Office  [phone]  CC: Primary care physician; Center, [place]  Note: This dictation was prepared with Diplomatic Services operational officer dictation along with smaller phrase technology. Any transcriptional errors that result from this process are unintentional.

## 2018-12-28 NOTE — Progress Notes (Signed)
Abg results shown to keene .j np

## 2018-12-28 NOTE — Progress Notes (Signed)
Inpatient Diabetes Program Recommendations  AACE/ADA: New Consensus Statement on Inpatient Glycemic Control (2015)  Target Ranges:  Prepandial:   less than 140 mg/dL      Peak postprandial:   less than 180 mg/dL (1-2 hours)      Critically ill patients:  140 - 180 mg/dL   Results for Pamela Lane, Pamela Lane (MRN [id number]) as of 12/28/2018 07:38  Ref. Range 12/28/2018 04:30  Sodium Latest Ref Range: 135 - 145 mmol/L 148 (H)  Potassium Latest Ref Range: 3.5 - 5.1 mmol/L 3.7  Chloride Latest Ref Range: 98 - 111 mmol/L 116 (H)  CO2 Latest Ref Range: 22 - 32 mmol/L 16 (L)  Glucose Latest Ref Range: 70 - 99 mg/dL 256 (H)  BUN Latest Ref Range: 6 - 20 mg/dL 26 (H)  Creatinine Latest Ref Range: 0.44 - 1.00 mg/dL 1.58 (H)  Calcium Latest Ref Range: 8.9 - 10.3 mg/dL 8.2 (L)  Anion gap Latest Ref Range: 5 - 15  16 (H)    Results for Pamela Lane, Pamela Lane (MRN [id number]) as of 12/28/2018 07:38  Ref. Range 02/17/2018 04:40 12/26/2018 12:30  Hemoglobin A1C Latest Ref Range: 4.8 - 5.6 % 14.6 (H) 14.2 (H)  (360 mg/dl)    Admit with: DKA with Severe Acidosis/ Pancreatitis  History: DM, ETOH Abuse  Home DM Meds: Metformin 500 mg BID  Current Orders: IV Insulin Drip      Intubated this AM.  Remains Confused per Nursing Assessment.  Patient was discharged from [facility] back in February and it looks like she was told to stop taking her Metformin and start taking Glipizide XL 5 mg Daily in the AM and Lantus 14 units QAM as well.     MD- Note that Anion Gap remains elevated this AM at 16 and CO2 still low at 16.  Please keep patient on the IV Insulin drip until both values have resolved.  When patient ready to transition to SQ Insulin, please make sure patient receives basal insulin at least 1 hour prior to d/c of the IV Insulin drip.  Will try to speak with pt about her home DM regimen once extubated and appropriate.    Patient will need Insulin at time of d/c given her  Current  A1c of 14.2%     --Will follow patient during hospitalization--  Wyn Quaker RN, MSN, CDE Diabetes Coordinator Inpatient Glycemic Control Team Team Pager: [phone] (8a-5p)

## 2018-12-28 NOTE — Progress Notes (Signed)
Pt in resp distress with increase WOF ; placed on non-rebreather, breathing tx given per RT. See detail charting in Flow sheet. Will continue to monitor pt closely.

## 2018-12-28 NOTE — Procedures (Signed)
Central Venous Catheter Insertion Procedure Note MIGUELINA FORE [id number] [birth year]  Procedure: Insertion of Central Venous Catheter Indications: Assessment of intravascular volume, Drug and/or fluid administration and Frequent blood sampling  Procedure Details Consent: Unable to obtain consent because of emergent medical necessity. Time Out: Verified patient identification, verified procedure, site/side was marked, verified correct patient position, special equipment/implants available, medications/allergies/relevent history reviewed, required imaging and test results available.  Performed  Maximum sterile technique was used including antiseptics, cap, gloves, gown, hand hygiene, mask and sheet. Skin prep: Chlorhexidine; local anesthetic administered A antimicrobial bonded/coated triple lumen catheter was placed in the right femoral vein due to emergent situation using the Seldinger technique.  Evaluation Blood flow good Complications: No apparent complications Patient did tolerate procedure well. Chest X-ray ordered to verify placement.  CXR: Not needed, placed in right femoral vein.   Procedure was performed using Ultrasound for direct visualization of cannulization of Right Femoral Vein.     Darel Hong, AGACNP-BC [facility] Pager: [phone] Cell: [phone]   [name] 12/28/2018, 12:17 AM

## 2018-12-28 NOTE — Progress Notes (Addendum)
Placed pt back on n/c 4 lpm per Dewaine Conger .J sat 100%

## 2018-12-28 NOTE — Progress Notes (Signed)
Follow up - Critical Care Medicine Note  Patient Details:    Pamela Lane is an 58 y.o. female with a history of ethanol and cocaine abuse, who presented with DKA and encephalopathy.  Appears also to have DTs.  Admitted to stepdown for management of the same.  Lines, Airways, Drains: Urethral Catheter [name], RN 14 Fr. (Active)  Site Assessment Clean;Intact 12/27/18 1500  Catheter Maintenance Bag below level of bladder;Catheter secured;Drainage bag/tubing not touching floor;No dependent loops;Seal intact;Bag emptied prior to transport 12/27/18 1500  Collection Container Standard drainage bag 12/27/18 1500  Input (mL) 250 mL 12/27/18 1800    Anti-infectives:  Anti-infectives (From admission, onward)   Start     Dose/Rate Route Frequency Ordered Stop   12/27/18 1345  cefTRIAXone (ROCEPHIN) 1 g in sodium chloride 0.9 % 100 mL IVPB  Status:  Discontinued     1 g 200 mL/hr over 30 Minutes Intravenous Daily 12/27/18 1335 12/27/18 1336   12/27/18 1345  cefTRIAXone (ROCEPHIN) 2 g in sodium chloride 0.9 % 100 mL IVPB     2 g 200 mL/hr over 30 Minutes Intravenous Daily 12/27/18 1336        Microbiology: Results for orders placed or performed during the hospital encounter of 12/26/18  Novel Coronavirus,NAA,(SEND-OUT TO REF LAB - TAT 24-48 hrs); Hosp Order     Status: None   Collection Time: 12/26/18  3:26 AM   Specimen: Nasopharyngeal Swab; Respiratory  Result Value Ref Range Status   SARS-CoV-2, NAA NOT DETECTED NOT DETECTED Final    Comment: (NOTE) This test was developed and its performance characteristics determined by [facility]. This test has not been FDA cleared or approved. This test has been authorized by FDA under an Emergency Use Authorization (EUA). This test is only authorized for the duration of time the declaration that circumstances exist justifying the authorization of the emergency use of in vitro diagnostic tests for detection of SARS-CoV-2 virus and/or  diagnosis of COVID-19 infection under section 564(b)(1) of the Act, 21 U.S.C. 454UJW-1(X)(9360bbb-3(b)(1), unless the authorization is terminated or revoked sooner. When diagnostic testing is negative, the possibility of a false negative result should be considered in the context of a patient's recent exposures and the presence of clinical signs and symptoms consistent with COVID-19. An individual without symptoms of COVID-19 and who is not shedding SARS-CoV-2 virus would expect to have a negative (not detected) result in this assay. Performed  At: [facility] [address] [place], [place] [id number] Jolene SchimkeNagendra Sanjai MD ZH:[id number]:[phone]    Coronavirus Source NASOPHARYNGEAL  Final    Comment: Performed at [facility], [address] [place], [place] [address]  MRSA PCR Screening     Status: None   Collection Time: 12/26/18  4:12 AM   Specimen: Nasopharyngeal  Result Value Ref Range Status   MRSA by PCR NEGATIVE NEGATIVE Final    Comment:        The GeneXpert MRSA Assay (FDA approved for NASAL specimens only), is one component of a comprehensive MRSA colonization surveillance program. It is not intended to diagnose MRSA infection nor to guide or monitor treatment for MRSA infections. Performed at [facility], [address] [place], [place] [address]   Culture, blood (routine x 2)     Status: None (Preliminary result)   Collection Time: 12/26/18  6:27 AM   Specimen: BLOOD  Result Value Ref Range Status   Specimen Description BLOOD RIGHT ANTECUBITAL  Final   Special Requests   Final  BOTTLES DRAWN AEROBIC AND ANAEROBIC Blood Culture adequate volume   Culture   Final    NO GROWTH 2 DAYS Performed at [facility], [address] [place], [place] [address]    Report Status PENDING  Incomplete  Culture, blood (routine x 2)     Status: None (Preliminary result)   Collection Time: 12/26/18  6:38 AM   Specimen: BLOOD  Result Value Ref Range Status    Specimen Description BLOOD BLOOD RIGHT FOREARM  Final   Special Requests   Final    BOTTLES DRAWN AEROBIC AND ANAEROBIC Blood Culture adequate volume   Culture   Final    NO GROWTH 2 DAYS Performed at [facility], [address] [place], [place] [address]    Report Status PENDING  Incomplete  Urine Culture     Status: Abnormal (Preliminary result)   Collection Time: 12/27/18 12:15 PM   Specimen: Urine, Catheterized  Result Value Ref Range Status   Specimen Description   Final    URINE, CATHETERIZED Performed at [facility], [address], [place], [place] [address]    Special Requests   Final    NONE Performed at [facility], [address], [place], [place] [address]    Culture >=100,000 COLONIES/mL PROTEUS MIRABILIS (A)  Final   Report Status PENDING  Incomplete    Best Practice/Protocols:  VTE Prophylaxis: Lovenox (prophylaxtic dose) GI Prophylaxis: Antihistamine CIWA  Hyperglycemia: DKA protocol Ventilator management/continuous sedation  Events:   Studies: Ct Abdomen Pelvis Wo Contrast  Result Date: 12/28/2018 CLINICAL DATA:  Acute pancreatitis, SIRS, leukocytosis, fever EXAM: CT ABDOMEN AND PELVIS WITHOUT CONTRAST TECHNIQUE: Multidetector CT imaging of the abdomen and pelvis was performed following the standard protocol without IV contrast. Sagittal and coronal MPR images reconstructed from axial data set. No GI contrast administered. COMPARISON:  12/26/2018 FINDINGS: Lower chest: Bibasilar atelectasis and minimal pleural effusions. Hepatobiliary: New high-density material dependently within gallbladder since 12/26/2018 consistent with vicarious excretion of previously administered contrast. No patent mass lesion. Pancreas: Enlarged and ill-defined consistent with acute pancreatitis. Slightly increased peripancreatic edema. No discrete pancreatic mass or hemorrhage. No abnormal fluid collections. Spleen: Normal appearance.  Tiny splenule  inferior to spleen Adrenals/Urinary Tract: Thickening of adrenal glands without discrete mass. Kidneys and ureters normal appearance. Foley catheter decompresses urinary bladder. Stomach/Bowel: Normal appendix. Nasogastric tube in stomach with, which is decompressed. Bowel loops otherwise unremarkable Vascular/Lymphatic: Atherosclerotic calcifications aorta and iliac arteries. RIGHT femoral line present tip at confluence of the RIGHT common iliac vein. No adenopathy. Reproductive: Uterus surgically absent. No definite ovaries visualized Other: Free fluid in pelvis, small volume.  No free air or hernia. Musculoskeletal: Osseous demineralization. No acute bony findings. Chronic subchondral cystic and sclerotic changes at the RIGHT femoral head could reflect prior avascular necrosis or degenerative changes, unchanged since 05/11/2016. IMPRESSION: Acute pancreatitis changes with slightly increased peripancreatic edema since previous exam. No evidence of hemorrhage or abscess. Unable to assess vascular patency due to lack of IV contrast administration. Bibasilar atelectasis and minimal pleural effusions. Small amount of free fluid in pelvis. Electronically Signed   By: Lavonia Dana M.D.   On: 12/28/2018 11:24   Dg Chest 1 View  Result Date: 12/28/2018 CLINICAL DATA:  Intubation EXAM: CHEST  1 VIEW COMPARISON:  12/28/2018 FINDINGS: Endotracheal tube terminates 2.2 cm above the carina. Mild left lower lung opacity, progressive, favoring lingular pneumonia. Right lung is clear. No pleural effusion or pneumothorax. The heart is normal in size. IMPRESSION: Progressive  left lower lung opacity, favoring lingular pneumonia. Endotracheal tube terminates 2.2 cm above the carina. Electronically Signed   By: Charline BillsSriyesh  Krishnan M.D.   On: 12/28/2018 07:03   Ct Abdomen Pelvis W Contrast  Result Date: 12/26/2018 CLINICAL DATA:  58 y/o F; hyperglycemia and abdominal pain. History of diabetes. EXAM: CT ABDOMEN AND PELVIS WITH  CONTRAST TECHNIQUE: Multidetector CT imaging of the abdomen and pelvis was performed using the standard protocol following bolus administration of intravenous contrast. CONTRAST:  100mL OMNIPAQUE IOHEXOL 300 MG/ML  SOLN COMPARISON:  03/15/2018 MRI of the abdomen. 03/04/2018 CT abdomen and pelvis. FINDINGS: Lower chest: No acute abnormality. Hepatobiliary: No focal liver abnormality is seen. No gallstones, gallbladder wall thickening, or biliary dilatation. Pancreas: Edema surrounds the pancreas diffusely. No discrete acute peripancreatic collection. No main duct dilatation of the pancreas. Pancreas homogeneously enhances. Spleen: Normal in size without focal abnormality. Adrenals/Urinary Tract: Adrenal glands are unremarkable. Kidneys are normal, without renal calculi, focal lesion, or hydronephrosis. Bladder is unremarkable. Stomach/Bowel: Stomach is within normal limits. Appendix appears normal. No evidence of bowel wall thickening, distention, or inflammatory changes. Vascular/Lymphatic: Aortic atherosclerosis. No enlarged abdominal or pelvic lymph nodes. Reproductive: Status post hysterectomy. No adnexal masses. Other: No abdominal wall hernia or abnormality. No abdominopelvic ascites. Musculoskeletal: No fracture is seen. Interval resection of right flank fatty mass. Moderate loss of height of the L5-S1 intervertebral disc space. IMPRESSION: Extensive edema surrounding the pancreas compatible with acute pancreatitis. No findings of pancreatic necrosis or acute peripancreatic collection at this time. Electronically Signed   By: Mitzi HansenLance  Furusawa-Stratton M.D.   On: 12/26/2018 02:46   Dg Chest Port 1 View  Result Date: 12/28/2018 CLINICAL DATA:  Acute respiratory failure. EXAM: PORTABLE CHEST 1 VIEW COMPARISON:  Two-view chest x-ray 02/16/2018 FINDINGS: The heart size is normal. Minimal asymmetric airspace opacities are present at the left base. Lung volumes are low at this is there is no edema or effusion. No  significant consolidation is present. The visualized soft tissues and bony thorax are unremarkable. IMPRESSION: 1. Low lung volumes with mild basilar airspace opacity on the left. While this likely reflects atelectasis, infection is not excluded. Electronically Signed   By: Marin Robertshristopher  Mattern M.D.   On: 12/28/2018 04:13   Dg Abd Portable 1v  Result Date: 12/28/2018 CLINICAL DATA:  Status post NG tube placement EXAM: PORTABLE ABDOMEN - 1 VIEW COMPARISON:  None. FINDINGS: The NG tube terminates in the stomach.  No other abnormalities. IMPRESSION: The NG tube is in good position, terminating in the stomach. Electronically Signed   By: Gerome Samavid  Williams III M.D   On: 12/28/2018 10:45    Consults: Treatment Team:  Pccm, Raymond [facility], MD   Subjective:    Overnight Issues: Required intubation this morning due to respiratory distress from acidosis and agitation from DTs.  Has acute pancreatitis however lipase on downward trend.  Objective:  Vital signs for last 24 hours: Temp:  [97.6 F (36.4 C)-100 F (37.8 C)] 97.9 F (36.6 C) (06/18 1600) Pulse Rate:  [31-115] 68 (06/18 1600) Resp:  [16-40] 24 (06/18 1600) BP: (80-175)/(41-99) 141/97 (06/18 1600) SpO2:  [89 %-100 %] 99 % (06/18 1600) FiO2 (%):  [40 %] 40 % (06/18 1530)  Hemodynamic parameters for last 24 hours:    Intake/Output from previous day: 06/17 0701 - 06/18 0700 In: 4038.2 [I.V.:3088.2; IV Piggyback:700] Out: 1800 [Urine:1800]  Intake/Output this shift: Total I/O In: 2119.5 [I.V.:1846; IV Piggyback:273.5] Out: -   Vent settings for last 24 hours: Vent Mode:  PRVC FiO2 (%):  [40 %] 40 % Set Rate:  [24 bmp] 24 bmp Vt Set:  [450 mL] 450 mL PEEP:  [5 cmH20] 5 cmH20 Plateau Pressure:  [17 cmH20] 17 cmH20  Physical Exam:  General: well developed, obese female, sedated mechanically ventilated.   Neuro:sedated, synchronous with the vent, no further assessment possible due to sedation HEENT: supple, no JVD, trachea midline,  mucosal membranes moist  Cardiovascular:  Regular rate and rhythm, no M/R/G  Lungs: clear throughout, encouraged with the ventilator  Abdomen: +BS x4, obese, soft, no distention. Musculoskeletal: normal bulk and tone, no edema  Skin: intact no rashes or lesions present   Assessment/Plan:   Acute respiratory failure with hypoxia, suspect due to the need for benzodiazepines to manage DTs and ongoing metabolic acidosis due to DKA Mechanical ventilator support Possible small infiltrate in the lingula, sputum for C&S. Precedex and fentanyl for ventilator synchrony Aim for RASS -1 to -2 Daily wake-up assessment and BT as tolerates   Diabetic ketoacidosis, recurrent acidosis with compensatory respiratory alkalosis Continue insulin gtt until anion gap closed and serum CO2 >20 CBG q1hr and BMP q4hrs while on insulin gtt  Query element of alcoholic ketoacidosis as well, continue to supplement thiamine  Acute renal failure likely secondary to volume depletion in setting of DKA  Metabolic derangements due to DKA Trend BMP Replace electrolytes as indicated  Monitor UOP  Avoid nephrotoxic medications Received volume challenge x2 L of crystalloid today Low-dose bicarb drip as buffer   Leukocytosis on downward trend  Urinary tract infection Urine culture positive for Proteus mirabilis Trend WBC and monitor fever curve  Continue Rocephin COVID-19 results: NEGATIVE   Pancreatitis Abdominal pain  Amylase was elevated on downward trend which is good prognostic sign CT scan of the abdomen pelvis shows pancreatic edema and stranding consistent with pancreatitis Pain control with fentanyl  ETOH Abuse/withdrawal Current everyday smoker  CIWA protocol-ativan per protocol  Currently on sedation for mechanical ventilation main sedative: Precedex    LOS: 2 days   Additional comments: Discussed that ICU multidisciplinary rounds.  Critical Care Total Time*: 45 Minutes  C. Danice GoltzLaura  Luise Yamamoto, MD  PCCM 12/28/2018  *Care during the described time interval was provided by me and/or other providers on the critical care team.  I have reviewed this patient's available data, including medical history, events of note, physical examination and test results as part of my evaluation.

## 2018-12-28 NOTE — Progress Notes (Addendum)
Pharmacy Electrolyte Monitoring Consult:  Pharmacy consulted to assist in monitoring and replacing electrolytes in this 58 y.o. female admitted on 12/26/2018 with Hyperglycemia   Labs:  Sodium (mmol/L)  Date Value  12/28/2018 148 (H)   Potassium (mmol/L)  Date Value  12/28/2018 3.1 (L)   Magnesium (mg/dL)  Date Value  12/28/2018 2.2   Phosphorus (mg/dL)  Date Value  12/26/2018 3.0   Calcium (mg/dL)  Date Value  12/28/2018 7.6 (L)   Albumin (g/dL)  Date Value  12/28/2018 2.4 (L)   Corrected Calcium 8.9   Assessment: Mg & P both at goal Patient remains hypernatremic at 148.   Plan: -Replace potassium with KCl 40 mEq via tube every 4 hours x2   -Will check electrolytes with am labs  -Goal postassium is 4, goal magnesium is 2  Pharmacy will continue to monitor and adjust per consult.   Marisa Cyphers, PharmD Candidate 12/28/2018 2:33 PM

## 2018-12-28 NOTE — Progress Notes (Signed)
Patient transported to CT on transport ventilator.  Patient tolerated well.

## 2018-12-28 NOTE — Progress Notes (Signed)
Pt hypotensive 86/46 (56) , NP notified. LR 500cc bolus x 1 ordered & given. 4 runs of potassium given. 2l/ [place] initiated; Will continue to monitor pt closely.

## 2018-12-29 ENCOUNTER — Inpatient Hospital Stay: Payer: Medicaid Other

## 2018-12-29 DIAGNOSIS — J9600 Acute respiratory failure, unspecified whether with hypoxia or hypercapnia: Secondary | ICD-10-CM

## 2018-12-29 DIAGNOSIS — Z9911 Dependence on respirator [ventilator] status: Secondary | ICD-10-CM

## 2018-12-29 LAB — BLOOD GAS, ARTERIAL
Acid-base deficit: 0.9 mmol/L (ref 0.0–2.0)
Bicarbonate: 20.7 mmol/L (ref 20.0–28.0)
FIO2: 0.4
MECHVT: 450 mL
O2 Saturation: 98.6 %
PEEP: 5 cmH2O
Patient temperature: 37
RATE: 24 resp/min
pCO2 arterial: 26 mmHg — ABNORMAL LOW (ref 32.0–48.0)
pH, Arterial: 7.51 — ABNORMAL HIGH (ref 7.350–7.450)
pO2, Arterial: 106 mmHg (ref 83.0–108.0)

## 2018-12-29 LAB — CBC WITH DIFFERENTIAL/PLATELET
Abs Immature Granulocytes: 0.1 10*3/uL — ABNORMAL HIGH (ref 0.00–0.07)
Basophils Absolute: 0.1 10*3/uL (ref 0.0–0.1)
Basophils Relative: 1 %
Eosinophils Absolute: 0.1 10*3/uL (ref 0.0–0.5)
Eosinophils Relative: 0 %
HCT: 33.2 % — ABNORMAL LOW (ref 36.0–46.0)
Hemoglobin: 11.7 g/dL — ABNORMAL LOW (ref 12.0–15.0)
Immature Granulocytes: 1 %
Lymphocytes Relative: 6 %
Lymphs Abs: 0.9 10*3/uL (ref 0.7–4.0)
MCH: 30.7 pg (ref 26.0–34.0)
MCHC: 35.2 g/dL (ref 30.0–36.0)
MCV: 87.1 fL (ref 80.0–100.0)
Monocytes Absolute: 1 10*3/uL (ref 0.1–1.0)
Monocytes Relative: 6 %
Neutro Abs: 14.2 10*3/uL — ABNORMAL HIGH (ref 1.7–7.7)
Neutrophils Relative %: 86 %
Platelets: 168 10*3/uL (ref 150–400)
RBC: 3.81 MIL/uL — ABNORMAL LOW (ref 3.87–5.11)
RDW: 13.3 % (ref 11.5–15.5)
WBC: 16.4 10*3/uL — ABNORMAL HIGH (ref 4.0–10.5)
nRBC: 0 % (ref 0.0–0.2)

## 2018-12-29 LAB — BLOOD GAS, VENOUS
Acid-base deficit: 11.3 mmol/L — ABNORMAL HIGH (ref 0.0–2.0)
Bicarbonate: 14.9 mmol/L — ABNORMAL LOW (ref 20.0–28.0)
FIO2: 0.21
O2 Saturation: 17.1 %
Patient temperature: 37
pCO2, Ven: 34 mmHg — ABNORMAL LOW (ref 44.0–60.0)
pH, Ven: 7.25 (ref 7.250–7.430)
pO2, Ven: <31 mmHg — CL (ref 32.0–45.0)

## 2018-12-29 LAB — BASIC METABOLIC PANEL
Anion gap: 11 (ref 5–15)
BUN: 28 mg/dL — ABNORMAL HIGH (ref 6–20)
CO2: 23 mmol/L (ref 22–32)
Calcium: 7.7 mg/dL — ABNORMAL LOW (ref 8.9–10.3)
Chloride: 115 mmol/L — ABNORMAL HIGH (ref 98–111)
Creatinine, Ser: 1.28 mg/dL — ABNORMAL HIGH (ref 0.44–1.00)
GFR calc Af Amer: 54 mL/min — ABNORMAL LOW (ref >60)
GFR calc non Af Amer: 46 mL/min — ABNORMAL LOW (ref >60)
Glucose, Bld: 265 mg/dL — ABNORMAL HIGH (ref 70–99)
Potassium: 3.8 mmol/L (ref 3.5–5.1)
Sodium: 149 mmol/L — ABNORMAL HIGH (ref 135–145)

## 2018-12-29 LAB — URINE CULTURE: Culture: >=100000 — AB

## 2018-12-29 LAB — GLUCOSE, CAPILLARY
Glucose-Capillary: 193 mg/dL — ABNORMAL HIGH (ref 70–99)
Glucose-Capillary: 224 mg/dL — ABNORMAL HIGH (ref 70–99)
Glucose-Capillary: 229 mg/dL — ABNORMAL HIGH (ref 70–99)
Glucose-Capillary: 233 mg/dL — ABNORMAL HIGH (ref 70–99)
Glucose-Capillary: 234 mg/dL — ABNORMAL HIGH (ref 70–99)
Glucose-Capillary: 243 mg/dL — ABNORMAL HIGH (ref 70–99)

## 2018-12-29 LAB — MAGNESIUM: Magnesium: 2.4 mg/dL (ref 1.7–2.4)

## 2018-12-29 LAB — PHOSPHORUS: Phosphorus: 2.9 mg/dL (ref 2.5–4.6)

## 2018-12-29 LAB — PROCALCITONIN: Procalcitonin: 1.48 ng/mL

## 2018-12-29 MED ORDER — FOLIC ACID 5 MG/ML IJ SOLN
1.0000 mg | Freq: Every day | INTRAMUSCULAR | Status: DC
  Administered 2018-12-30: 1 mg via INTRAVENOUS
  Filled 2018-12-29: qty 0.2

## 2018-12-29 MED ORDER — INSULIN ASPART 100 UNIT/ML ~~LOC~~ SOLN
0.0000 [IU] | SUBCUTANEOUS | Status: DC
  Administered 2018-12-29 – 2018-12-30 (×2): 4 [IU] via SUBCUTANEOUS
  Administered 2018-12-30: 7 [IU] via SUBCUTANEOUS
  Administered 2018-12-30: 4 [IU] via SUBCUTANEOUS
  Filled 2018-12-29 (×3): qty 1

## 2018-12-29 MED ORDER — POTASSIUM CL IN DEXTROSE 5% 20 MEQ/L IV SOLN
20.0000 meq | INTRAVENOUS | Status: DC
  Administered 2018-12-29 – 2018-12-30 (×2): 20 meq via INTRAVENOUS
  Filled 2018-12-29 (×3): qty 1000

## 2018-12-29 MED ORDER — VITAMIN B-6 50 MG PO TABS
50.0000 mg | ORAL_TABLET | Freq: Every day | ORAL | Status: DC
  Administered 2018-12-30: 50 mg via ORAL
  Filled 2018-12-29 (×2): qty 1

## 2018-12-29 MED ORDER — SENNOSIDES-DOCUSATE SODIUM 8.6-50 MG PO TABS
2.0000 | ORAL_TABLET | Freq: Two times a day (BID) | ORAL | Status: DC
  Administered 2018-12-29 – 2018-12-30 (×2): 2 via ORAL
  Filled 2018-12-29 (×2): qty 2

## 2018-12-29 MED ORDER — FAMOTIDINE IN NACL 20-0.9 MG/50ML-% IV SOLN: 20.0000 mg | INTRAVENOUS | Status: DC

## 2018-12-29 MED ORDER — INSULIN GLARGINE 100 UNIT/ML ~~LOC~~ SOLN
22.0000 [IU] | Freq: Every day | SUBCUTANEOUS | Status: DC
  Administered 2018-12-30 – 2018-12-31 (×2): 22 [IU] via SUBCUTANEOUS
  Filled 2018-12-29 (×4): qty 0.22

## 2018-12-29 MED ORDER — THIAMINE HCL 100 MG/ML IJ SOLN
100.0000 mg | Freq: Every day | INTRAMUSCULAR | Status: DC
  Administered 2018-12-30: 09:00:00 100 mg via INTRAVENOUS
  Filled 2018-12-29: qty 2

## 2018-12-29 MED ORDER — FOLIC ACID 1 MG PO TABS: 1.0000 mg | ORAL_TABLET | Freq: Every day | ORAL | Status: DC

## 2018-12-29 MED ORDER — CEFAZOLIN SODIUM-DEXTROSE 1-4 GM/50ML-% IV SOLN
1.0000 g | Freq: Three times a day (TID) | INTRAVENOUS | Status: DC
  Administered 2018-12-30 – 2018-12-31 (×4): 1 g via INTRAVENOUS
  Filled 2018-12-29 (×7): qty 50

## 2018-12-29 MED ORDER — ADULT MULTIVITAMIN W/MINERALS CH
1.0000 | ORAL_TABLET | Freq: Every day | ORAL | Status: DC
  Administered 2018-12-30 – 2018-12-31 (×2): 1 via ORAL
  Filled 2018-12-29 (×2): qty 1

## 2018-12-29 MED ORDER — INSULIN GLARGINE 100 UNIT/ML ~~LOC~~ SOLN
4.0000 [IU] | Freq: Once | SUBCUTANEOUS | Status: AC
  Administered 2018-12-29: 4 [IU] via SUBCUTANEOUS
  Filled 2018-12-29: qty 0.04

## 2018-12-29 NOTE — Progress Notes (Signed)
Cuff leak noted.  Patient extubated and placed on 2lpm [place].  Tolerated well.

## 2018-12-29 NOTE — Progress Notes (Addendum)
Follow up - Critical Care Medicine Note  Patient Details:    Pamela MoraleMary G Enslin is an 58 y.o. female with a history of ethanol and cocaine abuse, who presented with DKA and encephalopathy. Admitted to stepdown for management for insulin gtt.  Developed ETOH withdrawal symptoms and acute respiratory failure secondary to metabolic derangements requiring mechanical intubation.  Lines, Airways, Drains: Urethral Catheter [name], RN 14 Fr. (Active)  Site Assessment Clean;Intact 12/27/18 1500  Catheter Maintenance Bag below level of bladder;Catheter secured;Drainage bag/tubing not touching floor;No dependent loops;Seal intact;Bag emptied prior to transport 12/27/18 1500  Collection Container Standard drainage bag 12/27/18 1500  Input (mL) 250 mL 12/27/18 1800    Anti-infectives:  Anti-infectives (From admission, onward)   Start     Dose/Rate Route Frequency Ordered Stop   12/30/18 0600  ceFAZolin (ANCEF) IVPB 1 g/50 mL premix     1 g 100 mL/hr over 30 Minutes Intravenous Every 8 hours 12/29/18 1036 01/01/19 0559   12/27/18 1345  cefTRIAXone (ROCEPHIN) 1 g in sodium chloride 0.9 % 100 mL IVPB  Status:  Discontinued     1 g 200 mL/hr over 30 Minutes Intravenous Daily 12/27/18 1335 12/27/18 1336   12/27/18 1345  cefTRIAXone (ROCEPHIN) 2 g in sodium chloride 0.9 % 100 mL IVPB  Status:  Discontinued     2 g 200 mL/hr over 30 Minutes Intravenous Daily 12/27/18 1336 12/29/18 1036      Microbiology: Results for orders placed or performed during the hospital encounter of 12/26/18  Novel Coronavirus,NAA,(SEND-OUT TO REF LAB - TAT 24-48 hrs); Hosp Order     Status: None   Collection Time: 12/26/18  3:26 AM   Specimen: Nasopharyngeal Swab; Respiratory  Result Value Ref Range Status   SARS-CoV-2, NAA NOT DETECTED NOT DETECTED Final    Comment: (NOTE) This test was developed and its performance characteristics determined by [facility]. This test has not been FDA cleared or approved. This  test has been authorized by FDA under an Emergency Use Authorization (EUA). This test is only authorized for the duration of time the declaration that circumstances exist justifying the authorization of the emergency use of in vitro diagnostic tests for detection of SARS-CoV-2 virus and/or diagnosis of COVID-19 infection under section 564(b)(1) of the Act, 21 U.S.C. 161WRU-0(A)(5360bbb-3(b)(1), unless the authorization is terminated or revoked sooner. When diagnostic testing is negative, the possibility of a false negative result should be considered in the context of a patient's recent exposures and the presence of clinical signs and symptoms consistent with COVID-19. An individual without symptoms of COVID-19 and who is not shedding SARS-CoV-2 virus would expect to have a negative (not detected) result in this assay. Performed  At: [facility] [address] [place], [place] [id number] Jolene SchimkeNagendra Sanjai MD NW:[id number]:[phone]    Coronavirus Source NASOPHARYNGEAL  Final    Comment: Performed at [facility], [address] [place], [place] [address]  MRSA PCR Screening     Status: None   Collection Time: 12/26/18  4:12 AM   Specimen: Nasopharyngeal  Result Value Ref Range Status   MRSA by PCR NEGATIVE NEGATIVE Final    Comment:        The GeneXpert MRSA Assay (FDA approved for NASAL specimens only), is one component of a comprehensive MRSA colonization surveillance program. It is not intended to diagnose MRSA infection nor to guide or monitor treatment for MRSA infections. Performed at [facility], [address] [place], [place] [address]   Culture, blood (routine  x 2)     Status: None (Preliminary result)   Collection Time: 12/26/18  6:27 AM   Specimen: BLOOD  Result Value Ref Range Status   Specimen Description BLOOD RIGHT ANTECUBITAL  Final   Special Requests   Final    BOTTLES DRAWN AEROBIC AND ANAEROBIC Blood Culture adequate volume   Culture   Final     NO GROWTH 3 DAYS Performed at [facility], [address] [place], [place] [address]    Report Status PENDING  Incomplete  Culture, blood (routine x 2)     Status: None (Preliminary result)   Collection Time: 12/26/18  6:38 AM   Specimen: BLOOD  Result Value Ref Range Status   Specimen Description BLOOD BLOOD RIGHT FOREARM  Final   Special Requests   Final    BOTTLES DRAWN AEROBIC AND ANAEROBIC Blood Culture adequate volume   Culture   Final    NO GROWTH 3 DAYS Performed at [facility], [address] [place], [place] [address]    Report Status PENDING  Incomplete  Urine Culture     Status: Abnormal   Collection Time: 12/27/18 12:15 PM   Specimen: Urine, Catheterized  Result Value Ref Range Status   Specimen Description   Final    URINE, CATHETERIZED Performed at [facility], [address] [place], [place] [address]    Special Requests   Final    NONE Performed at [facility], [address] [place], [place] [address]    Culture >=100,000 COLONIES/mL PROTEUS MIRABILIS (A)  Final   Report Status 12/29/2018 FINAL  Final   Organism ID, Bacteria PROTEUS MIRABILIS (A)  Final      Susceptibility   Proteus mirabilis - MIC*    AMPICILLIN <=2 SENSITIVE Sensitive     CEFAZOLIN <=4 SENSITIVE Sensitive     CEFTRIAXONE <=1 SENSITIVE Sensitive     CIPROFLOXACIN <=0.25 SENSITIVE Sensitive     GENTAMICIN <=1 SENSITIVE Sensitive     IMIPENEM 4 SENSITIVE Sensitive     NITROFURANTOIN 128 RESISTANT Resistant     TRIMETH/SULFA <=20 SENSITIVE Sensitive     AMPICILLIN/SULBACTAM <=2 SENSITIVE Sensitive     PIP/TAZO <=4 SENSITIVE Sensitive     * >=100,000 COLONIES/mL PROTEUS MIRABILIS  Culture, respiratory (non-expectorated)     Status: None (Preliminary result)   Collection Time: 12/28/18 12:16 PM   Specimen: Tracheal Aspirate; Respiratory  Result Value Ref Range Status   Specimen Description   Final    TRACHEAL ASPIRATE Performed at [facility], [address] [place], [place] [address]    Special Requests   Final    NONE Performed at [facility], [address] [place], [place] [address]    Gram Stain   Final    MODERATE WBC PRESENT,BOTH PMN AND MONONUCLEAR NO ORGANISMS SEEN    Culture   Final    NO GROWTH < 24 HOURS Performed at [facility], [address] [place], [place] [address]    Report Status PENDING  Incomplete    Best Practice/Protocols:  VTE Prophylaxis: Lovenox (prophylaxtic dose) GI Prophylaxis: Antihistamine CIWA   SIGNIFICANT EVENTS: 06/16-Pt admitted to the stepdown unit with DKA requiring insulin gtt  06/18-Pt developed worsening acute respiratory failure requiring mechanical intubation  06/19-Pt successfully extubated  Studies: Ct Abdomen Pelvis Wo Contrast  Result Date: 12/28/2018 CLINICAL DATA:  Acute pancreatitis, SIRS, leukocytosis, fever EXAM: CT ABDOMEN AND PELVIS WITHOUT CONTRAST TECHNIQUE: Multidetector CT imaging of the  abdomen and pelvis was performed following the standard protocol without IV contrast. Sagittal and coronal MPR images reconstructed from axial data set. No GI contrast administered. COMPARISON:  12/26/2018 FINDINGS: Lower chest: Bibasilar atelectasis and minimal pleural effusions. Hepatobiliary: New high-density material dependently within gallbladder since 12/26/2018 consistent with vicarious excretion of previously administered contrast. No patent mass lesion. Pancreas: Enlarged and ill-defined consistent with acute pancreatitis. Slightly increased peripancreatic edema. No discrete pancreatic mass or hemorrhage. No abnormal fluid collections. Spleen: Normal appearance.  Tiny splenule inferior to spleen Adrenals/Urinary Tract: Thickening of adrenal glands without discrete mass. Kidneys and ureters normal appearance. Foley catheter decompresses urinary bladder. Stomach/Bowel: Normal appendix. Nasogastric tube in stomach with, which is decompressed.  Bowel loops otherwise unremarkable Vascular/Lymphatic: Atherosclerotic calcifications aorta and iliac arteries. RIGHT femoral line present tip at confluence of the RIGHT common iliac vein. No adenopathy. Reproductive: Uterus surgically absent. No definite ovaries visualized Other: Free fluid in pelvis, small volume.  No free air or hernia. Musculoskeletal: Osseous demineralization. No acute bony findings. Chronic subchondral cystic and sclerotic changes at the RIGHT femoral head could reflect prior avascular necrosis or degenerative changes, unchanged since 05/11/2016. IMPRESSION: Acute pancreatitis changes with slightly increased peripancreatic edema since previous exam. No evidence of hemorrhage or abscess. Unable to assess vascular patency due to lack of IV contrast administration. Bibasilar atelectasis and minimal pleural effusions. Small amount of free fluid in pelvis. Electronically Signed   By: Ulyses Southward M.D.   On: 12/28/2018 11:24   Dg Chest 1 View  Result Date: 12/28/2018 CLINICAL DATA:  Intubation EXAM: CHEST  1 VIEW COMPARISON:  12/28/2018 FINDINGS: Endotracheal tube terminates 2.2 cm above the carina. Mild left lower lung opacity, progressive, favoring lingular pneumonia. Right lung is clear. No pleural effusion or pneumothorax. The heart is normal in size. IMPRESSION: Progressive left lower lung opacity, favoring lingular pneumonia. Endotracheal tube terminates 2.2 cm above the carina. Electronically Signed   By: Charline Bills M.D.   On: 12/28/2018 07:03   Ct Abdomen Pelvis W Contrast  Result Date: 12/26/2018 CLINICAL DATA:  58 y/o F; hyperglycemia and abdominal pain. History of diabetes. EXAM: CT ABDOMEN AND PELVIS WITH CONTRAST TECHNIQUE: Multidetector CT imaging of the abdomen and pelvis was performed using the standard protocol following bolus administration of intravenous contrast. CONTRAST:  OMNIPAQUE IOHEXOL 300 MG/ML  SOLN COMPARISON:  03/15/2018 MRI of the abdomen.  03/04/2018 CT abdomen and pelvis. FINDINGS: Lower chest: No acute abnormality. Hepatobiliary: No focal liver abnormality is seen. No gallstones, gallbladder wall thickening, or biliary dilatation. Pancreas: Edema surrounds the pancreas diffusely. No discrete acute peripancreatic collection. No main duct dilatation of the pancreas. Pancreas homogeneously enhances. Spleen: Normal in size without focal abnormality. Adrenals/Urinary Tract: Adrenal glands are unremarkable. Kidneys are normal, without renal calculi, focal lesion, or hydronephrosis. Bladder is unremarkable. Stomach/Bowel: Stomach is within normal limits. Appendix appears normal. No evidence of bowel wall thickening, distention, or inflammatory changes. Vascular/Lymphatic: Aortic atherosclerosis. No enlarged abdominal or pelvic lymph nodes. Reproductive: Status post hysterectomy. No adnexal masses. Other: No abdominal wall hernia or abnormality. No abdominopelvic ascites. Musculoskeletal: No fracture is seen. Interval resection of right flank fatty mass. Moderate loss of height of the L5-S1 intervertebral disc space. IMPRESSION: Extensive edema surrounding the pancreas compatible with acute pancreatitis. No findings of pancreatic necrosis or acute peripancreatic collection at this time. Electronically Signed   By: Mitzi Hansen M.D.   On: 12/26/2018 02:46   Portable Chest Xray  Result Date: 12/29/2018 CLINICAL DATA:  Acute respiratory  failure. EXAM: PORTABLE CHEST 1 VIEW COMPARISON:  12/28/2018. FINDINGS: Endotracheal tube in stable position. NG tube noted tip below left hemidiaphragm. Heart size normal. Progressive left base infiltrate. Developing right base infiltrate. No pleural effusion or pneumothorax. IMPRESSION: 1. Endotracheal tube stable position. NG tube noted with tip below left hemidiaphragm. 2.  Progressive left base infiltrate.  New right base infiltrate. Electronically Signed   By: Maisie Fushomas  Register   On: 12/29/2018 07:28   Dg  Chest Port 1 View  Result Date: 12/28/2018 CLINICAL DATA:  Acute respiratory failure. EXAM: PORTABLE CHEST 1 VIEW COMPARISON:  Two-view chest x-ray 02/16/2018 FINDINGS: The heart size is normal. Minimal asymmetric airspace opacities are present at the left base. Lung volumes are low at this is there is no edema or effusion. No significant consolidation is present. The visualized soft tissues and bony thorax are unremarkable. IMPRESSION: 1. Low lung volumes with mild basilar airspace opacity on the left. While this likely reflects atelectasis, infection is not excluded. Electronically Signed   By: Marin Robertshristopher  Mattern M.D.   On: 12/28/2018 04:13   Dg Abd Portable 1v  Result Date: 12/28/2018 CLINICAL DATA:  Status post NG tube placement EXAM: PORTABLE ABDOMEN - 1 VIEW COMPARISON:  None. FINDINGS: The NG tube terminates in the stomach.  No other abnormalities. IMPRESSION: The NG tube is in good position, terminating in the stomach. Electronically Signed   By: Gerome Samavid  Williams III M.D   On: 12/28/2018 10:45    Consults: Treatment Team:  [facility], [facility], [place]   Subjective:    Pt remains lethargic with intermittent agitation post extubation   Objective:  Vital signs for last 24 hours: Temp:  [97.5 F (36.4 C)-97.9 F (36.6 C)] 97.7 F (36.5 C) (06/19 0800) Pulse Rate:  [61-75] 61 (06/19 0800) Resp:  [20-24] 20 (06/19 0800) BP: (100-146)/(70-97) 125/79 (06/19 0800) SpO2:  [97 %-100 %] 99 % (06/19 0800) FiO2 (%):  [40 %] 40 % (06/19 0840)  Hemodynamic parameters for last 24 hours:    Intake/Output from previous day: 06/18 0701 - 06/19 0700 In: 2642 [I.V.:2368.5; IV Piggyback:273.5] Out: 925 [Urine:925]  Intake/Output this shift: Total I/O In: -  Out: 200 [Urine:200]  Vent settings for last 24 hours: Vent Mode: PSV FiO2 (%):  [40 %] 40 % Set Rate:  [10 bmp-24 bmp] 10 bmp Vt Set:  [400 mL-450 mL] 400 mL PEEP:  [5 cmH20] 5 cmH20 Pressure Support:  [5 cmH20] 5 cmH20 Plateau  Pressure:  [15 cmH20-18 cmH20] 18 cmH20  Physical Exam:  General: well developed, well nourished female resting in bed, NAD  Neuro: lethargic with intermittent agitation, moves all extremities  HEENT: supple, no JVD, trachea midline, mucosal membranes moist  Cardiovascular: regular rate and rhythm, no M/R/G  Lungs: clear throughout, even non labored   Abdomen: +BS x4, obese, soft, no distention Musculoskeletal: normal bulk and tone, no edema  Skin: intact no rashes or lesions present   Assessment/Plan:   Acute respiratory failure with hypoxia, suspected due to metabolic derangements-improved   Prn supplemental O2 for dyspne and/or hypoxia  Prn bronchodilator therapy  Acute renal failure likely secondary to volume depletion in setting of DKA  Metabolic acidosis secondary to DKA-resolved  Hypernatremia  Will start D5W with KCl 20 meq/L @50  ml/hr  Trend BMP Replace electrolytes as indicated  Monitor UOP  Avoid nephrotoxic medications  Urinary tract infection (urine culture 06/17>>proteus mirabilis) Acute pancreatitis  Trend WBC and monitor fever curve  Trend PCT and lipase  Follow cultures  Continue cefazolin  COVID-19 results: Negative  SUP: continue pepcid   Uncontrolled type II diabetes mellitus  CBG's q4hrs  Continue SSI and scheduled lantus   Acute encephalopathy likely secondary to sedating medications  Hx: Current everyday smoker, Cocaine and ETOH Abuse  Attempt to avoid sedating medications Continue folic acid, thiamine, and mvi  Once mentation improves will need smoking, cocaine, and ETOH abuse cessation counseling   -Updated pts daughter Jethro Bastos via telephone regarding pt condition and plan of care all questions were answered.  Marda Stalker, [place]anna Pager [phone] (please enter 7 digits) PCCM Consult Pager [phone] (please enter 7 digits)  .

## 2018-12-29 NOTE — Progress Notes (Signed)
Tried to talk w/ pt today, however, pt was very sleepy post-extubation.  Called pt's daughter Jethro Bastos to get information on patient.  Ms. Simona Huh could not provide me with pt's home DM medication regimen, however, she was able to tell mw that the patient was supposed to be taking insulin.  Daughter told me that her Mom (the patient) frequently complained of uncontrolled CBGs at home.  Per daughter, pt has also been under a lot of emotional stress (death in the family).  Discussed with pt's daughter all the treatments we have given pt in the hospital (treatmemt of DKA, transition to SQ insulin, frequent CBG monitoring, adjustments of insulin).  Explained to daughter that we will continue to make insulin adjustments and that pt will likely need more intensive Diabetes medication regimen at time of discharge (likely including insulin).    Called pt's RN Hardie Pulley today to discuss the above info.  Asked RN to please evaluate pt's ability to give herself insulin when more appropriate.  Will follow up with pt on Monday if still here.     --Will follow patient during hospitalization--  Wyn Quaker RN, MSN, CDE Diabetes Coordinator Inpatient Glycemic Control Team Team Pager: [phone] (8a-5p)

## 2018-12-29 NOTE — Progress Notes (Addendum)
Inpatient Diabetes Program Recommendations  AACE/ADA: New Consensus Statement on Inpatient Glycemic Control (2015)  Target Ranges:  Prepandial:   less than 140 mg/dL      Peak postprandial:   less than 180 mg/dL (1-2 hours)      Critically ill patients:  140 - 180 mg/dL   Results for EMONII, WIENKE (MRN [id number]) as of 12/29/2018 10:02  Ref. Range 12/28/2018 12:39 12/28/2018 13:42 12/28/2018 14:52 12/28/2018 18:14 12/28/2018 19:27  Glucose-Capillary Latest Ref Range: 70 - 99 mg/dL 142 (H)  IV Insulin Drip 145 (H)  IV Insulin Drip 154 (H)  IV Insulin Drip +   18 units Lantus +  3 units Novolog 214 (H) 263 (H)  8 units NOVOLOG    Results for JOLYNE, LAYE (MRN [id number]) as of 12/29/2018 10:02  Ref. Range 12/29/2018 00:16 12/29/2018 04:15 12/29/2018 08:09  Glucose-Capillary Latest Ref Range: 70 - 99 mg/dL 234 (H)  5 units NOVOLOG  233 (H)  5 units NOVOLOG  229 (H)  5 units NOVOLOG +  18 units LANTUS    Results for AMAMDA, CURBOW (MRN [id number]) as of 12/28/2018 07:38  Ref. Range 02/17/2018 04:40 12/26/2018 12:30  Hemoglobin A1C Latest Ref Range: 4.8 - 5.6 % 14.6 (H) 14.2 (H)  (360 mg/dl)    Admit with: DKA with Severe Acidosis/ Pancreatitis  History: DM, ETOH Abuse  Home DM Meds: Metformin 500 mg BID  Current Orders: Lantus 18 units Daily      Novolog Moderate Correction Scale/ SSI (0-15 units) Q4 hours      Extubated this AM.  CBGs remain elevated.  Patient was discharged from [facility] back in February and it looks like she was told to stop taking her Metformin and start taking Glipizide XL 5 mg Daily in the AM and Lantus 14 units QAM as well.     MD- Please consider the following in-hospital insulin adjustments:   1. Increase Lantus to 22 units Daily (0.25 units/kg)  If dose already given this AM, please also give pt an extra 4 units Lantus X 1 dose this AM   2. Patient will need Insulin at time of d/c given her  Current  A1c of 14.2%     --Will follow patient during hospitalization--  Wyn Quaker RN, MSN, CDE Diabetes Coordinator Inpatient Glycemic Control Team Team Pager: [phone] (8a-5p)

## 2018-12-29 NOTE — Progress Notes (Signed)
[facility] [place] NAME: Pamela Lane    MR#:  [id number]  DATE OF BIRTH:  Nov 15, 1960  SUBJECTIVE:  CHIEF COMPLAINT:   Chief Complaint  Patient presents with  . Hyperglycemia  Patient seen today Has been extubated Off ventilator Lethargic and sleepy Not responding to verbal commands   REVIEW OF SYSTEMS:    ROS  Could not be obtained as patient is lethargic and sleepy  DRUG ALLERGIES:  No Known Allergies  VITALS:  Blood pressure 125/79, pulse 61, temperature 97.7 F (36.5 C), temperature source Oral, resp. rate 20, height 5\' 6"  (1.676 m), weight 91.3 kg, SpO2 99 %.  PHYSICAL EXAMINATION:   Physical Exam  GENERAL:  58 y.o.-year-old patient lying in the bed  EYES: Pupils equal, round, reactive to light and accommodation. No scleral icterus. Extraocular muscles intact.  HEENT: Head atraumatic, normocephalic. Oropharynx : Moist NECK:  Supple, no jugular venous distention. No thyroid enlargement, no tenderness.  LUNGS: Adequate air flow in both lungs On ventilator CARDIOVASCULAR: S1, S2 normal. No murmurs, rubs, or gallops.  ABDOMEN: Soft, nontender, nondistended. Bowel sounds present. No organomegaly or mass.  EXTREMITIES: No cyanosis, clubbing or edema b/l.    NEUROLOGIC: could not be assesed PSYCHIATRIC: Could not be assessed SKIN: No obvious rash, lesion, or ulcer.   LABORATORY PANEL:   CBC Recent Labs  Lab 12/29/18 0440  WBC 16.4*  HGB 11.7*  HCT 33.2*  PLT 168   ------------------------------------------------------------------------------------------------------------------ Chemistries  Recent Labs  Lab 12/28/18 0059  12/29/18 0440  NA 146*   < > 149*  K 3.5   < > 3.8  CL 116*   < > 115*  CO2 17*   < > 23  GLUCOSE 156*   < > 265*  BUN 29*   < > 28*  CREATININE 1.45*   < > 1.28*  CALCIUM 8.0*   < > 7.7*  MG  --    < > 2.4  AST 20  --   --   ALT 11  --   --   ALKPHOS 63  --   --   BILITOT 0.7   --   --    < > = values in this interval not displayed.   ------------------------------------------------------------------------------------------------------------------  Cardiac Enzymes Recent Labs  Lab 12/26/18 0627  TROPONINI 0.04*   ------------------------------------------------------------------------------------------------------------------  RADIOLOGY:  Ct Abdomen Pelvis Wo Contrast  Result Date: 12/28/2018 CLINICAL DATA:  Acute pancreatitis, SIRS, leukocytosis, fever EXAM: CT ABDOMEN AND PELVIS WITHOUT CONTRAST TECHNIQUE: Multidetector CT imaging of the abdomen and pelvis was performed following the standard protocol without IV contrast. Sagittal and coronal MPR images reconstructed from axial data set. No GI contrast administered. COMPARISON:  12/26/2018 FINDINGS: Lower chest: Bibasilar atelectasis and minimal pleural effusions. Hepatobiliary: New high-density material dependently within gallbladder since 12/26/2018 consistent with vicarious excretion of previously administered contrast. No patent mass lesion. Pancreas: Enlarged and ill-defined consistent with acute pancreatitis. Slightly increased peripancreatic edema. No discrete pancreatic mass or hemorrhage. No abnormal fluid collections. Spleen: Normal appearance.  Tiny splenule inferior to spleen Adrenals/Urinary Tract: Thickening of adrenal glands without discrete mass. Kidneys and ureters normal appearance. Foley catheter decompresses urinary bladder. Stomach/Bowel: Normal appendix. Nasogastric tube in stomach with, which is decompressed. Bowel loops otherwise unremarkable Vascular/Lymphatic: Atherosclerotic calcifications aorta and iliac arteries. RIGHT femoral line present tip at confluence of the RIGHT common iliac vein. No adenopathy. Reproductive: Uterus surgically absent. No definite ovaries visualized Other: Free fluid in pelvis, small  volume.  No free air or hernia. Musculoskeletal: Osseous demineralization. No acute  bony findings. Chronic subchondral cystic and sclerotic changes at the RIGHT femoral head could reflect prior avascular necrosis or degenerative changes, unchanged since 05/11/2016. IMPRESSION: Acute pancreatitis changes with slightly increased peripancreatic edema since previous exam. No evidence of hemorrhage or abscess. Unable to assess vascular patency due to lack of IV contrast administration. Bibasilar atelectasis and minimal pleural effusions. Small amount of free fluid in pelvis. Electronically Signed   By: Ulyses SouthwardMark  Boles M.D.   On: 12/28/2018 11:24   Dg Chest 1 View  Result Date: 12/28/2018 CLINICAL DATA:  Intubation EXAM: CHEST  1 VIEW COMPARISON:  12/28/2018 FINDINGS: Endotracheal tube terminates 2.2 cm above the carina. Mild left lower lung opacity, progressive, favoring lingular pneumonia. Right lung is clear. No pleural effusion or pneumothorax. The heart is normal in size. IMPRESSION: Progressive left lower lung opacity, favoring lingular pneumonia. Endotracheal tube terminates 2.2 cm above the carina. Electronically Signed   By: Charline BillsSriyesh  Krishnan M.D.   On: 12/28/2018 07:03   Portable Chest Xray  Result Date: 12/29/2018 CLINICAL DATA:  Acute respiratory failure. EXAM: PORTABLE CHEST 1 VIEW COMPARISON:  12/28/2018. FINDINGS: Endotracheal tube in stable position. NG tube noted tip below left hemidiaphragm. Heart size normal. Progressive left base infiltrate. Developing right base infiltrate. No pleural effusion or pneumothorax. IMPRESSION: 1. Endotracheal tube stable position. NG tube noted with tip below left hemidiaphragm. 2.  Progressive left base infiltrate.  New right base infiltrate. Electronically Signed   By: Maisie Fushomas  Register   On: 12/29/2018 07:28   Dg Chest Port 1 View  Result Date: 12/28/2018 CLINICAL DATA:  Acute respiratory failure. EXAM: PORTABLE CHEST 1 VIEW COMPARISON:  Two-view chest x-ray 02/16/2018 FINDINGS: The heart size is normal. Minimal asymmetric airspace opacities are  present at the left base. Lung volumes are low at this is there is no edema or effusion. No significant consolidation is present. The visualized soft tissues and bony thorax are unremarkable. IMPRESSION: 1. Low lung volumes with mild basilar airspace opacity on the left. While this likely reflects atelectasis, infection is not excluded. Electronically Signed   By: Marin Robertshristopher  Mattern M.D.   On: 12/28/2018 04:13   Dg Abd Portable 1v  Result Date: 12/28/2018 CLINICAL DATA:  Status post NG tube placement EXAM: PORTABLE ABDOMEN - 1 VIEW COMPARISON:  None. FINDINGS: The NG tube terminates in the stomach.  No other abnormalities. IMPRESSION: The NG tube is in good position, terminating in the stomach. Electronically Signed   By: Gerome Samavid  Williams III M.D   On: 12/28/2018 10:45     ASSESSMENT AND PLAN:  58 year old female patient with history of diabetes mellitus, hypertension, bronchial asthma currently in the stepdown unit  -Status post hypoxic respiratory failure Off mechanical ventilator  -Acute diabetic ketoacidosis With respiratory alkalosis IV insulin drip Monitor blood sugars closely Monitor electrolytes NPO for now Intensivist f/u  -Delirium tremens Sedation  -Hyponatremia IV fluid hydration Probably secondary to hyperglycemia  -Acute pancreatitis CT abdomen reveals pancreatitis N.p.o. for now IV fluids ACE inhibitor on hold F/U Lipase levels   -Urinary tract infection Urine culture growing Proteus mirabilis Continue Rocephin antibiotic  -DVT prophylaxis subcu Lovenox daily  -Hypokalemia Replace potassium  -COVID-19 test negative  All the records are reviewed and case discussed with Care Management/Social Worker. Management plans discussed with the patient, family and they are in agreement.  CODE STATUS: Full code  DVT Prophylaxis: SCDs  TOTAL CRITICAL CARE TIME TAKING CARE OF THIS  PATIENT: 35 minutes.   POSSIBLE D/C IN 2 to 3 DAYS, DEPENDING ON CLINICAL  CONDITION.  Ihor AustinPavan  M.D on 12/29/2018 at 12:36 PM  Between 7am to 6pm - Pager - [phone]  After 6pm go to [email or web address] - password [email or web address] [facility]  [facility] [place] [facility]  Office  [phone]  CC: Primary care physician; [facility]  Note: This dictation was prepared with Nurse, children'sDragon dictation along with smaller phrase technology. Any transcriptional errors that result from this process are unintentional.

## 2018-12-29 NOTE — Progress Notes (Signed)
Tried to reach out to patient's daughter, several times, to update regarding patient's progress, unfortunately unable to speak with daughter

## 2018-12-30 LAB — BASIC METABOLIC PANEL
Anion gap: 10 (ref 5–15)
BUN: 18 mg/dL (ref 6–20)
CO2: 24 mmol/L (ref 22–32)
Calcium: 7.7 mg/dL — ABNORMAL LOW (ref 8.9–10.3)
Chloride: 110 mmol/L (ref 98–111)
Creatinine, Ser: 0.9 mg/dL (ref 0.44–1.00)
GFR calc Af Amer: >60 mL/min (ref >60)
GFR calc non Af Amer: >60 mL/min (ref >60)
Glucose, Bld: 234 mg/dL — ABNORMAL HIGH (ref 70–99)
Potassium: 3 mmol/L — ABNORMAL LOW (ref 3.5–5.1)
Sodium: 144 mmol/L (ref 135–145)

## 2018-12-30 LAB — GLUCOSE, CAPILLARY
Glucose-Capillary: 137 mg/dL — ABNORMAL HIGH (ref 70–99)
Glucose-Capillary: 146 mg/dL — ABNORMAL HIGH (ref 70–99)
Glucose-Capillary: 169 mg/dL — ABNORMAL HIGH (ref 70–99)
Glucose-Capillary: 188 mg/dL — ABNORMAL HIGH (ref 70–99)
Glucose-Capillary: 216 mg/dL — ABNORMAL HIGH (ref 70–99)
Glucose-Capillary: 261 mg/dL — ABNORMAL HIGH (ref 70–99)

## 2018-12-30 LAB — CULTURE, RESPIRATORY W GRAM STAIN: Culture: NORMAL

## 2018-12-30 LAB — MAGNESIUM: Magnesium: 1.9 mg/dL (ref 1.7–2.4)

## 2018-12-30 LAB — CBC
HCT: 32.5 % — ABNORMAL LOW (ref 36.0–46.0)
Hemoglobin: 11.4 g/dL — ABNORMAL LOW (ref 12.0–15.0)
MCH: 30.7 pg (ref 26.0–34.0)
MCHC: 35.1 g/dL (ref 30.0–36.0)
MCV: 87.6 fL (ref 80.0–100.0)
Platelets: 185 10*3/uL (ref 150–400)
RBC: 3.71 MIL/uL — ABNORMAL LOW (ref 3.87–5.11)
RDW: 13.3 % (ref 11.5–15.5)
WBC: 15.2 10*3/uL — ABNORMAL HIGH (ref 4.0–10.5)
nRBC: 0 % (ref 0.0–0.2)

## 2018-12-30 LAB — PHOSPHORUS: Phosphorus: 2.7 mg/dL (ref 2.5–4.6)

## 2018-12-30 LAB — PROCALCITONIN: Procalcitonin: 0.75 ng/mL

## 2018-12-30 MED ORDER — ACETAMINOPHEN 325 MG PO TABS
650.0000 mg | ORAL_TABLET | Freq: Four times a day (QID) | ORAL | Status: DC | PRN
  Administered 2018-12-30: 650 mg via ORAL
  Filled 2018-12-30: qty 2

## 2018-12-30 MED ORDER — LORAZEPAM 2 MG/ML IJ SOLN
0.5000 mg | INTRAMUSCULAR | Status: DC | PRN
  Administered 2018-12-30 (×2): 0.5 mg via INTRAVENOUS
  Filled 2018-12-30 (×2): qty 1

## 2018-12-30 MED ORDER — ENOXAPARIN SODIUM 40 MG/0.4ML ~~LOC~~ SOLN: 40.0000 mg | SUBCUTANEOUS | Status: DC

## 2018-12-30 MED ORDER — SERTRALINE HCL 50 MG PO TABS
25.0000 mg | ORAL_TABLET | Freq: Every day | ORAL | Status: DC
  Administered 2018-12-30 – 2018-12-31 (×2): 25 mg via ORAL
  Filled 2018-12-30 (×2): qty 1

## 2018-12-30 MED ORDER — INSULIN ASPART 100 UNIT/ML ~~LOC~~ SOLN
4.0000 [IU] | Freq: Three times a day (TID) | SUBCUTANEOUS | Status: DC
  Administered 2018-12-30 – 2018-12-31 (×4): 4 [IU] via SUBCUTANEOUS
  Filled 2018-12-30 (×4): qty 1

## 2018-12-30 MED ORDER — LORAZEPAM 2 MG/ML IJ SOLN
0.5000 mg | INTRAMUSCULAR | Status: DC | PRN
  Administered 2018-12-30 (×3): 1 mg via INTRAVENOUS
  Filled 2018-12-30 (×3): qty 1

## 2018-12-30 MED ORDER — INSULIN ASPART 100 UNIT/ML ~~LOC~~ SOLN
0.0000 [IU] | Freq: Three times a day (TID) | SUBCUTANEOUS | Status: DC
  Administered 2018-12-30: 2 [IU] via SUBCUTANEOUS
  Administered 2018-12-30: 8 [IU] via SUBCUTANEOUS
  Administered 2018-12-31 (×2): 3 [IU] via SUBCUTANEOUS
  Filled 2018-12-30 (×3): qty 1

## 2018-12-30 MED ORDER — INSULIN ASPART 100 UNIT/ML ~~LOC~~ SOLN: 0.0000 [IU] | Freq: Every day | SUBCUTANEOUS | Status: DC

## 2018-12-30 MED ORDER — POTASSIUM CHLORIDE CRYS ER 20 MEQ PO TBCR
40.0000 meq | EXTENDED_RELEASE_TABLET | Freq: Two times a day (BID) | ORAL | Status: AC
  Administered 2018-12-30 (×2): 40 meq via ORAL
  Filled 2018-12-30 (×2): qty 2

## 2018-12-30 NOTE — Progress Notes (Signed)
[place] [facility] [place] NAME: Pamela Lane    MR#:  [id number]  DATE OF BIRTH:  1960-10-30  SUBJECTIVE:  CHIEF COMPLAINT:   Chief Complaint  Patient presents with  . Hyperglycemia   Patient seen today  extubated Off ventilator  Alert and oriented but tearful on simple talks.  Said she is hungry.  REVIEW OF SYSTEMS:    Review of Systems  Constitutional: Negative for fever, malaise/fatigue and weight loss.  HENT: Negative for congestion, ear discharge, sore throat and tinnitus.   Eyes: Negative for blurred vision, double vision and discharge.  Respiratory: Negative for cough, sputum production and shortness of breath.   Cardiovascular: Negative for chest pain, palpitations and leg swelling.  Gastrointestinal: Negative for abdominal pain, diarrhea, nausea and vomiting.  Genitourinary: Negative for dysuria, frequency and urgency.  Musculoskeletal: Negative for back pain, joint pain and myalgias.  Skin: Negative for rash.  Neurological: Negative for dizziness, tingling, tremors and focal weakness.    DRUG ALLERGIES:  No Known Allergies  VITALS:  Blood pressure (!) 88/60, pulse (!) 117, temperature 98.1 F (36.7 C), temperature source Axillary, resp. rate 16, height 5\' 6"  (1.676 m), weight 91.3 kg, SpO2 98 %.  PHYSICAL EXAMINATION:   Physical Exam  GENERAL:  58 y.o.-year-old patient lying in the bed  EYES: Pupils equal, round, reactive to light and accommodation. No scleral icterus. Extraocular muscles intact.  HEENT: Head atraumatic, normocephalic. Oropharynx : Moist NECK:  Supple, no jugular venous distention. No thyroid enlargement, no tenderness.  LUNGS: Adequate air flow in both lungs On ventilator CARDIOVASCULAR: S1, S2 normal. No murmurs, rubs, or gallops.  ABDOMEN: Soft, nontender, nondistended. Bowel sounds present. No organomegaly or mass.  EXTREMITIES: No cyanosis, clubbing or edema b/l.    NEUROLOGIC: Alert and  oriented, moves all 4 limbs and follows commands. PSYCHIATRIC: Appears depressed and tearful on simple talks. SKIN: No obvious rash, lesion, or ulcer.   LABORATORY PANEL:   CBC Recent Labs  Lab 12/30/18 0520  WBC 15.2*  HGB 11.4*  HCT 32.5*  PLT 185   ------------------------------------------------------------------------------------------------------------------ Chemistries  Recent Labs  Lab 12/28/18 0059  12/30/18 0520  NA 146*   < > 144  K 3.5   < > 3.0*  CL 116*   < > 110  CO2 17*   < > 24  GLUCOSE 156*   < > 234*  BUN 29*   < > 18  CREATININE 1.45*   < > 0.90  CALCIUM 8.0*   < > 7.7*  MG  --    < > 1.9  AST 20  --   --   ALT 11  --   --   ALKPHOS 63  --   --   BILITOT 0.7  --   --    < > = values in this interval not displayed.   ------------------------------------------------------------------------------------------------------------------  Cardiac Enzymes Recent Labs  Lab 12/26/18 0627  TROPONINI 0.04*   ------------------------------------------------------------------------------------------------------------------  RADIOLOGY:  Portable Chest Xray  Result Date: 12/29/2018 CLINICAL DATA:  Acute respiratory failure. EXAM: PORTABLE CHEST 1 VIEW COMPARISON:  12/28/2018. FINDINGS: Endotracheal tube in stable position. NG tube noted tip below left hemidiaphragm. Heart size normal. Progressive left base infiltrate. Developing right base infiltrate. No pleural effusion or pneumothorax. IMPRESSION: 1. Endotracheal tube stable position. NG tube noted with tip below left hemidiaphragm. 2.  Progressive left base infiltrate.  New right base infiltrate. Electronically Signed   By: Marcello Moores  Register  On: 12/29/2018 07:28     ASSESSMENT AND PLAN:  58 year old female patient with history of diabetes mellitus, hypertension, bronchial asthma currently in the stepdown unit  -Status post hypoxic respiratory failure Off mechanical ventilator-stable now.  -Acute  diabetic ketoacidosis With respiratory alkalosis IV insulin drip Monitor blood sugars closely Monitor electrolytes Improved, started on basal insulin and oral diet.  -Delirium tremens Sedation, improved and stable now.  -Hyponatremia IV fluid hydration Probably secondary to hyperglycemia  -Acute pancreatitis CT abdomen reveals pancreatitis N.p.o.  IV fluids ACE inhibitor on hold F/U Lipase levels  Patient improved with minimal pain now so starting on oral diet.  -Urinary tract infection Urine culture growing Proteus mirabilis Continue Rocephin antibiotic  -DVT prophylaxis subcu Lovenox daily  -Hypokalemia Replace potassium  -COVID-19 test negative  -Depression Start on small dose of oral Zoloft.  All the records are reviewed and case discussed with Care Management/Social Worker. Management plans discussed with the patient, family and they are in agreement.  CODE STATUS: Full code  DVT Prophylaxis: SCDs  TOTAL CRITICAL CARE TIME TAKING CARE OF THIS PATIENT: 35 minutes.   POSSIBLE D/C IN 2 to 3 DAYS, DEPENDING ON CLINICAL CONDITION.  Altamese DillingVaibhavkumar Dayannara Pascal M.D on 12/30/2018 at 1:51 PM  Between 7am to 6pm - Pager - [phone]  After 6pm go to [email or web address] - password [email or web address] [facility]  [facility] [place] [facility]  Office  [phone]  CC: Primary care physician; [facility]  Note: This dictation was prepared with Nurse, children'sDragon dictation along with smaller phrase technology. Any transcriptional errors that result from this process are unintentional.

## 2018-12-30 NOTE — Progress Notes (Signed)
PULMONARY/CCM PROGRESS NOTE  PT PROFILE: 58 y.o. female with a history of ethanol and cocaine abuse, adm 6/16 with DKA and encephalopathy for insulin gtt. on 6/18, developed EtOH withdrawal symptoms and acute respiratory failure secondary to metabolic derangements requiring intubation.  MAJOR EVENTS/TEST RESULTS: 06/16 admitted 06/18 intubated  06/19 extubated 06/20 transfer to med surg floor ordered  INDWELLING DEVICES:: R femoral CVL 06/18 >>   MICRO DATA: MRSA PCR 06/16 >> NEG SARS CoV2 PCR 06/16 >> NEG Urine 06/17 >> proteus Resp 06/18 >> NOF Blood 06/16 >> NEG    ANTIMICROBIALS:  Anti-infectives (From admission, onward)   Start     Dose/Rate Route Frequency Ordered Stop   12/30/18 0600  ceFAZolin (ANCEF) IVPB 1 g/50 mL premix     1 g 100 mL/hr over 30 Minutes Intravenous Every 8 hours 12/29/18 1036 01/01/19 0559   12/27/18 1345  cefTRIAXone (ROCEPHIN) 1 g in sodium chloride 0.9 % 100 mL IVPB  Status:  Discontinued     1 g 200 mL/hr over 30 Minutes Intravenous Daily 12/27/18 1335 12/27/18 1336   12/27/18 1345  cefTRIAXone (ROCEPHIN) 2 g in sodium chloride 0.9 % 100 mL IVPB  Status:  Discontinued     2 g 200 mL/hr over 30 Minutes Intravenous Daily 12/27/18 1336 12/29/18 1036       SUBJ: Intermittently confused and occasionally agitated.  No respiratory distress.  Tolerating extubation well.  Conversant, RASS 0, +1   OBJ: Vitals:   12/30/18 1000 12/30/18 1100 12/30/18 1200 12/30/18 1300  BP:      Pulse: (!) 122 (!) 109 (!) 107 (!) 117  Resp: (!) _0 Temp:      TempSrc:      SpO2: 97% 100% 96% 98%  Weight:      Height:      RA  Gen: NAD HEENT: NCAT, sclerae white Neck: No LAN, no JVD noted Lungs: Clear to auscultation and percussion Cardiovascular: Regular, no M Abdomen: Soft, NT, +BS Ext: Warm, no edema Neuro: PERRL, EOMI, motor/sensory grossly intact Skin: No lesions noted   BMP Latest Ref Rng & Units 12/30/2018 12/29/2018 12/28/2018   Glucose 70 - 99 mg/dL 234(H) 265(H) 157(H)  BUN 6 - 20 mg/dL 18 28(H) 22(H)  Creatinine 0.44 - 1.00 mg/dL 0.90 1.28(H) 1.29(H)  Sodium 135 - 145 mmol/L 144 149(H) 148(H)  Potassium 3.5 - 5.1 mmol/L 3.0(L) 3.8 3.1(L)  Chloride 98 - 111 mmol/L 110 115(H) 113(H)  CO2 22 - 32 mmol/L _1 Calcium 8.9 - 10.3 mg/dL 7.7(L) 7.7(L) 7.6(L)    Hepatic Function Latest Ref Rng & Units 12/28/2018 12/26/2018 12/18/2018  Total Protein 6.5 - 8.1 g/dL 5.6(L) 7.7 7.5  Albumin 3.5 - 5.0 g/dL 2.4(L) 4.1 4.1  AST 15 - 41 U/L 20 14(L) 11(L)  ALT 0 - 44 U/L _2 Alk Phosphatase 38 - 126 U/L 63 108 93  Total Bilirubin 0.3 - 1.2 mg/dL 0.7 1.4(H) 0.8  Bilirubin, Direct 0.0 - 0.2 mg/dL - - <0.1    CBC Latest Ref Rng & Units 12/30/2018 12/29/2018 12/28/2018  WBC 4.0 - 10.5 K/uL 15.2(H) 16.4(H) 15.4(H)  Hemoglobin 12.0 - 15.0 g/dL 11.4(L) 11.7(L) 11.5(L)  Hematocrit 36.0 - 46.0 % 32.5(L) 33.2(L) 32.9(L)  Platelets 150 - 400 K/uL 185 168 164   ABG    Component Value Date/Time   PHART 7.51 (H) 12/29/2018 0500   PCO2ART 26 (L) 12/29/2018 0500   PO2ART 106 12/29/2018 0500  HCO3 20.7 12/29/2018 0500   TCO2 15 01/12/2016 2315   ACIDBASEDEF 0.9 12/29/2018 0500   O2SAT 98.6 12/29/2018 0500   CXR: No new film   IMPRESSION: Acute respiratory failure, resolved DKA, resolved Type 2 diabetes Polysubstance abuse Mild agitation, concern for alcohol withdrawal syndrome   PLAN/REC: Transfer to [place] floor. After transfer, PCCM service will sign off. Advance diet and activity Low-dose lorazepam ordered 6/24 anxiety/agitation Insulin regimen changed to Chrystie Nose, MD PCCM service Mobile [phone] Pager [phone] 12/30/2018 2:38 PM

## 2018-12-30 NOTE — Progress Notes (Signed)
Patient requested to call family members and significant other to uplift her spirits.  She spoke with daughters, grandson, and significant other several times between 2130 and 2345.

## 2018-12-30 NOTE — Progress Notes (Signed)
Pt accepted in transfer from [facility]. Report received from angela. Pt agitated inspite of ativan given prior to transfer. Up unable to manage purewick. Pt nauseated and having some dry heaves. Incontinent of loose stool. Rt groin 3-lumencentral line dressing changed. Pt finally dozing.

## 2018-12-31 LAB — CBC
HCT: 33.3 % — ABNORMAL LOW (ref 36.0–46.0)
Hemoglobin: 11.3 g/dL — ABNORMAL LOW (ref 12.0–15.0)
MCH: 30.8 pg (ref 26.0–34.0)
MCHC: 33.9 g/dL (ref 30.0–36.0)
MCV: 90.7 fL (ref 80.0–100.0)
Platelets: 189 10*3/uL (ref 150–400)
RBC: 3.67 MIL/uL — ABNORMAL LOW (ref 3.87–5.11)
RDW: 13.5 % (ref 11.5–15.5)
WBC: 13.3 10*3/uL — ABNORMAL HIGH (ref 4.0–10.5)
nRBC: 0 % (ref 0.0–0.2)

## 2018-12-31 LAB — BASIC METABOLIC PANEL
Anion gap: 12 (ref 5–15)
BUN: 8 mg/dL (ref 6–20)
CO2: 23 mmol/L (ref 22–32)
Calcium: 7.7 mg/dL — ABNORMAL LOW (ref 8.9–10.3)
Chloride: 102 mmol/L (ref 98–111)
Creatinine, Ser: 0.8 mg/dL (ref 0.44–1.00)
GFR calc Af Amer: >60 mL/min (ref >60)
GFR calc non Af Amer: >60 mL/min (ref >60)
Glucose, Bld: 191 mg/dL — ABNORMAL HIGH (ref 70–99)
Potassium: 2.8 mmol/L — ABNORMAL LOW (ref 3.5–5.1)
Sodium: 137 mmol/L (ref 135–145)

## 2018-12-31 LAB — CULTURE, BLOOD (ROUTINE X 2)
Culture: NO GROWTH
Culture: NO GROWTH
Special Requests: ADEQUATE
Special Requests: ADEQUATE

## 2018-12-31 LAB — GLUCOSE, CAPILLARY
Glucose-Capillary: 186 mg/dL — ABNORMAL HIGH (ref 70–99)
Glucose-Capillary: 180 mg/dL — ABNORMAL HIGH (ref 70–99)

## 2018-12-31 MED ORDER — DILTIAZEM HCL ER COATED BEADS 120 MG PO CP24: 120.0000 mg | ORAL_CAPSULE | Freq: Every day | ORAL | 0 refills | Status: DC

## 2018-12-31 MED ORDER — CEFUROXIME AXETIL 250 MG PO TABS: 250.0000 mg | ORAL_TABLET | Freq: Two times a day (BID) | ORAL | 0 refills | Status: DC

## 2018-12-31 MED ORDER — DILTIAZEM HCL ER COATED BEADS 120 MG PO CP24
120.0000 mg | ORAL_CAPSULE | Freq: Every day | ORAL | Status: DC
  Administered 2018-12-31: 120 mg via ORAL
  Filled 2018-12-31: qty 1

## 2018-12-31 MED ORDER — TRAMADOL HCL 50 MG PO TABS
50.0000 mg | ORAL_TABLET | Freq: Once | ORAL | Status: AC
  Administered 2018-12-31: 50 mg via ORAL
  Filled 2018-12-31: qty 1

## 2018-12-31 NOTE — Discharge Summary (Signed)
[facility] NAME: Pamela Lane    MR#:  [id number]  DATE OF BIRTH:  March 19, 1961  DATE OF ADMISSION:  12/26/2018 ADMITTING PHYSICIAN: Harrie Foreman, MD  DATE OF DISCHARGE: 12/31/2018   PRIMARY CARE PHYSICIAN: [facility] [place]    ADMISSION DIAGNOSIS:  Diabetic ketoacidosis without coma associated with type 2 diabetes mellitus ([place]) [E11.10]  DISCHARGE DIAGNOSIS:  Active Problems:   DKA (diabetic ketoacidoses) ([place])   SECONDARY DIAGNOSIS:   Past Medical History:  Diagnosis Date  . Asthma   . Diabetes mellitus without complication ([place])   . Hypertension   . Mental disorder    pt reports 'I have all of them mental disorders'    HOSPITAL COURSE:   58 year old female patient with history of diabetes mellitus, hypertension, bronchial asthma currently in the stepdown unit  -Status post hypoxic respiratory failure Off mechanical ventilator-stable now.  -Acute diabetic ketoacidosis With respiratory alkalosis IV insulin drip Monitor blood sugars closely Monitor electrolytes Improved, started on basal insulin and oral diet.  -Delirium tremens Sedation, improved and stable now.  -Hyponatremia IV fluid hydration Probably secondary to hyperglycemia  -Acute pancreatitis CT abdomen reveals pancreatitis N.p.o.  IV fluids ACE inhibitor on hold F/U Lipase levels  Patient improved with minimal pain now so starting on oral diet.  -Urinary tract infection Urine culture growing Proteus mirabilis Continue Rocephin antibiotic  -DVT prophylaxis subcu Lovenox daily  -Hypokalemia Replace potassium  -COVID-19 test negative  -Depression Start on small dose of oral Zoloft.  She was on seraquel at home, resume.  She had generalized weakness, called PT eval- suggested SNF- but pt is not willing to go to SNF and want to go home, will arran ge HHA and PT and a walker.  DISCHARGE  CONDITIONS:   Stable.  CONSULTS OBTAINED:    DRUG ALLERGIES:  No Known Allergies  DISCHARGE MEDICATIONS:   Allergies as of 12/31/2018   No Known Allergies     Medication List    TAKE these medications   albuterol 108 (90 Base) MCG/ACT inhaler Commonly known as: VENTOLIN HFA Inhale 2 puffs into the lungs every 6 (six) hours as needed for wheezing or shortness of breath.   aspirin EC 81 MG tablet Take 81 mg by mouth daily.   blood glucose meter kit and supplies Kit Dispense based on patient and insurance preference. Use up to four times daily as directed. (FOR ICD-9 250.00, 250.01).   buPROPion 150 MG 12 hr tablet Commonly known as: WELLBUTRIN SR Take 1 tablet (150 mg total) by mouth 2 (two) times daily.   cefUROXime 250 MG tablet Commonly known as: Ceftin Take 1 tablet (250 mg total) by mouth 2 (two) times daily for 3 days.   diltiazem 120 MG 24 hr capsule Commonly known as: CARDIZEM CD Take 1 capsule (120 mg total) by mouth daily. Start taking on: December 31, 2424   folic acid 1 MG tablet Commonly known as: FOLVITE Take 1 tablet (1 mg total) by mouth daily.   glipiZIDE 5 MG 24 hr tablet Commonly known as: GLUCOTROL XL Take 5 mg by mouth daily with breakfast.   ipratropium-albuterol 0.5-2.5 (3) MG/3ML Soln Commonly known as: DUONEB Take 3 mLs by nebulization every 6 (six) hours as needed for wheezing, shortness of breath or cough.   Lantus 100 UNIT/ML injection Generic drug: insulin glargine Inject 14 Units into the skin daily after breakfast.   lisinopril 10 MG tablet Commonly known  as: Prinivil Take 1 tablet (10 mg total) by mouth daily.   metFORMIN 500 MG tablet Commonly known as: GLUCOPHAGE Take 1 tablet (500 mg total) by mouth 2 (two) times daily with a meal.   multivitamin with minerals Tabs tablet Take 1 tablet by mouth daily.   QUEtiapine 200 MG tablet Commonly known as: SEROQUEL Take 200 mg by mouth at bedtime.   QUEtiapine 25 MG  tablet Commonly known as: SEROQUEL Take 25 mg by mouth every 4 (four) hours as needed.   senna-docusate 8.6-50 MG tablet Commonly known as: Senokot-S Take 1 tablet by mouth at bedtime as needed for mild constipation.   thiamine 100 MG tablet Take 1 tablet (100 mg total) by mouth daily.            Durable Medical Equipment  (From admission, onward)         Start     Ordered   12/31/18 1402  For home use only DME Gilford Rile  [facility])  Once    Question:  Patient needs a walker to treat with the following condition  Answer:  Weakness   12/31/18 1402           DISCHARGE INSTRUCTIONS:    Follow with PMD in 1-2 weeks.  If you experience worsening of your admission symptoms, develop shortness of breath, life threatening emergency, suicidal or homicidal thoughts you must seek medical attention immediately by calling 911 or calling your MD immediately  if symptoms less severe.  You Must read complete instructions/literature along with all the possible adverse reactions/side effects for all the Medicines you take and that have been prescribed to you. Take any new Medicines after you have completely understood and accept all the possible adverse reactions/side effects.   Please note  You were cared for by a hospitalist during your hospital stay. If you have any questions about your discharge medications or the care you received while you were in the hospital after you are discharged, you can call the unit and asked to speak with the hospitalist on call if the hospitalist that took care of you is not available. Once you are discharged, your primary care physician will handle any further medical issues. Please note that NO REFILLS for any discharge medications will be authorized once you are discharged, as it is imperative that you return to your primary care physician (or establish a relationship with a primary care physician if you do not have one) for your aftercare needs so that they can  reassess your need for medications and monitor your lab values.    Today   CHIEF COMPLAINT:   Chief Complaint  Patient presents with  . Hyperglycemia    HISTORY OF PRESENT ILLNESS:  Pamela Lane  is a 58 y.o. female with a known history of diabetes and hypertension presents to the emergency department via EMS due to abdominal pain.  The patient's boyfriend called 911 because he was concerned the patient was not acting right.  She was found to have a blood sugar greater than 1000.  General distress prompted ABG (actually venous) which showed metabolic acidosis.  The patient had other electrolyte abnormalities consistent with diabetic ketoacidosis.  An insulin drip was started in the emergency department staff call the hospitalist service for admission.   VITAL SIGNS:  Blood pressure 123/70, pulse (!) 119, temperature 98.3 F (36.8 C), temperature source Oral, resp. rate 20, height '5\' 3"'  (1.6 m), weight 99.7 kg, SpO2 100 %.  I/O:    Intake/Output  Summary (Last 24 hours) at 12/31/2018 1402 Last data filed at 12/31/2018 0900 Gross per 24 hour  Intake 210.77 ml  Output 0 ml  Net 210.77 ml    PHYSICAL EXAMINATION:   GENERAL:  58 y.o.-year-old patient lying in the bed  EYES: Pupils equal, round, reactive to light and accommodation. No scleral icterus. Extraocular muscles intact.  HEENT: Head atraumatic, normocephalic. Oropharynx : Moist NECK:  Supple, no jugular venous distention. No thyroid enlargement, no tenderness.  LUNGS: Adequate air flow in both lungs On ventilator CARDIOVASCULAR: S1, S2 normal. No murmurs, rubs, or gallops.  ABDOMEN: Soft, nontender, nondistended. Bowel sounds present. No organomegaly or mass.  EXTREMITIES: No cyanosis, clubbing or edema b/l.    NEUROLOGIC: Alert and oriented, moves all 4 limbs and follows commands. PSYCHIATRIC: Appears depressed and tearful on simple talks. SKIN: No obvious rash, lesion, or ulcer.   DATA REVIEW:   CBC Recent Labs   Lab 12/31/18 0808  WBC 13.3*  HGB 11.3*  HCT 33.3*  PLT 189    Chemistries  Recent Labs  Lab 12/28/18 0059  12/30/18 0520 12/31/18 0808  NA 146*   < > 144 137  K 3.5   < > 3.0* 2.8*  CL 116*   < > 110 102  CO2 17*   < > 24 23  GLUCOSE 156*   < > 234* 191*  BUN 29*   < > 18 8  CREATININE 1.45*   < > 0.90 0.80  CALCIUM 8.0*   < > 7.7* 7.7*  MG  --    < > 1.9  --   AST 20  --   --   --   ALT 11  --   --   --   ALKPHOS 63  --   --   --   BILITOT 0.7  --   --   --    < > = values in this interval not displayed.    Cardiac Enzymes Recent Labs  Lab 12/26/18 0627  TROPONINI 0.04*    Microbiology Results  Results for orders placed or performed during the hospital encounter of 12/26/18  Novel Coronavirus,NAA,(SEND-OUT TO REF LAB - TAT 24-48 hrs); Hosp Order     Status: None   Collection Time: 12/26/18  3:26 AM   Specimen: Nasopharyngeal Swab; Respiratory  Result Value Ref Range Status   SARS-CoV-2, NAA NOT DETECTED NOT DETECTED Final    Comment: (NOTE) This test was developed and its performance characteristics determined by [employer]. This test has not been FDA cleared or approved. This test has been authorized by FDA under an Emergency Use Authorization (EUA). This test is only authorized for the duration of time the declaration that circumstances exist justifying the authorization of the emergency use of in vitro diagnostic tests for detection of SARS-CoV-2 virus and/or diagnosis of COVID-19 infection under section 564(b)(1) of the Act, 21 U.S.C. 919TYO-0(A)(0), unless the authorization is terminated or revoked sooner. When diagnostic testing is negative, the possibility of a false negative result should be considered in the context of a patient's recent exposures and the presence of clinical signs and symptoms consistent with COVID-19. An individual without symptoms of COVID-19 and who is not shedding SARS-CoV-2 virus would expect to have a negative  (not detected) result in this assay. Performed  At: [facility] [address] [place], [place] [id number] Rush Farmer MD SE:[id number]    Animas  Final    Comment: Performed at [facility],  [place], [place] [address]  MRSA PCR Screening     Status: None   Collection Time: 12/26/18  4:12 AM   Specimen: Nasopharyngeal  Result Value Ref Range Status   MRSA by PCR NEGATIVE NEGATIVE Final    Comment:        The GeneXpert MRSA Assay (FDA approved for NASAL specimens only), is one component of a comprehensive MRSA colonization surveillance program. It is not intended to diagnose MRSA infection nor to guide or monitor treatment for MRSA infections. Performed at [facility], [address] [place], [place] [address]   Culture, blood (routine x 2)     Status: None   Collection Time: 12/26/18  6:27 AM   Specimen: BLOOD  Result Value Ref Range Status   Specimen Description BLOOD RIGHT ANTECUBITAL  Final   Special Requests   Final    BOTTLES DRAWN AEROBIC AND ANAEROBIC Blood Culture adequate volume   Culture   Final    NO GROWTH 5 DAYS Performed at [facility], [address] [place], [place] [address]    Report Status 12/31/2018 FINAL  Final  Culture, blood (routine x 2)     Status: None   Collection Time: 12/26/18  6:38 AM   Specimen: BLOOD  Result Value Ref Range Status   Specimen Description BLOOD BLOOD RIGHT FOREARM  Final   Special Requests   Final    BOTTLES DRAWN AEROBIC AND ANAEROBIC Blood Culture adequate volume   Culture   Final    NO GROWTH 5 DAYS Performed at [facility], [address] [place], [place] [address]    Report Status 12/31/2018 FINAL  Final  Urine Culture     Status: Abnormal   Collection Time: 12/27/18 12:15 PM   Specimen: Urine, Catheterized  Result Value Ref Range Status   Specimen Description   Final    URINE, CATHETERIZED Performed at [facility], [address], [place], [place] [address]    Special Requests   Final    NONE Performed at [facility], [address] [place], [place] [address]    Culture >=100,000 COLONIES/mL PROTEUS MIRABILIS (A)  Final   Report Status 12/29/2018 FINAL  Final   Organism ID, Bacteria PROTEUS MIRABILIS (A)  Final      Susceptibility   Proteus mirabilis - MIC*    AMPICILLIN <=2 SENSITIVE Sensitive     CEFAZOLIN <=4 SENSITIVE Sensitive     CEFTRIAXONE <=1 SENSITIVE Sensitive     CIPROFLOXACIN <=0.25 SENSITIVE Sensitive     GENTAMICIN <=1 SENSITIVE Sensitive     IMIPENEM 4 SENSITIVE Sensitive     NITROFURANTOIN 128 RESISTANT Resistant     TRIMETH/SULFA <=20 SENSITIVE Sensitive     AMPICILLIN/SULBACTAM <=2 SENSITIVE Sensitive     PIP/TAZO <=4 SENSITIVE Sensitive     * >=100,000 COLONIES/mL PROTEUS MIRABILIS  Culture, respiratory (non-expectorated)     Status: None   Collection Time: 12/28/18 12:16 PM   Specimen: Tracheal Aspirate; Respiratory  Result Value Ref Range Status   Specimen Description   Final    TRACHEAL ASPIRATE Performed at [facility], [address] [place], [place] [address]    Special Requests   Final    NONE Performed at [facility], [address] [place], [place] [address]    Gram Stain   Final    MODERATE WBC PRESENT,BOTH PMN AND MONONUCLEAR NO ORGANISMS SEEN    Culture   Final  RARE Consistent with normal respiratory flora. Performed at [facility], [place] [address] [place], [place] [address]    Report Status 12/30/2018 FINAL  Final    RADIOLOGY:  No results found.  EKG:   Orders placed or performed during the hospital encounter of 12/26/18  . EKG 12-Lead  . EKG 12-Lead      Management plans discussed with the patient, family and they are in agreement.  CODE STATUS:  Code Status History    Date Active Date Inactive Code Status Order ID Comments User Context   12/26/2018 0356 12/30/2018 1055  Full Code [id number]  Harrie Foreman, MD Inpatient   03/04/2018 2203 03/07/2018 1426 Full Code [id number]  Demetrios Loll, MD Inpatient   02/16/2018 1330 02/18/2018 1412 Full Code [id number]  Gorden Harms, MD Inpatient   11/26/2017 1421 11/27/2017 1812 Full Code [id number]  Dustin Flock, MD Inpatient   10/16/2017 0212 10/17/2017 1427 Full Code [id number]  Fritzi Mandes, MD Inpatient   09/23/2017 2252 09/24/2017 1505 Full Code [id number]  Amelia Jo, MD Inpatient   05/15/2016 1745 05/16/2016 1749 Full Code [id number]  Dustin Flock, MD Inpatient   01/13/2016 0051 01/14/2016 1314 Full Code [id number]  Norval Morton, MD ED   01/08/2016 1916 01/09/2016 1704 Full Code [id number]  Theodoro Grist, MD Inpatient   Advance Care Planning Activity      TOTAL TIME TAKING CARE OF THIS PATIENT: 35 minutes.    Vaughan Basta M.D on 12/31/2018 at 2:02 PM  Between 7am to 6pm - Pager - [phone]  After 6pm go to [email or web address] - password [email or web address] [facility]  Office  [phone]  CC: Primary care physician; [facility] [place]   Note: This dictation was prepared with Diplomatic Services operational officer dictation along with smaller phrase technology. Any transcriptional errors that result from this process are unintentional.

## 2018-12-31 NOTE — Evaluation (Signed)
Physical Therapy Evaluation Patient Details Name: Pamela Lane MRN: [id number] DOB: 05/25/1961 Today's Date: 12/31/2018   History of Present Illness  58 y.o. female with a history of ethanol and cocaine abuse, adm 6/16 with DKA and encephalopathy for insulin gtt. on 6/18, developed EtOH withdrawal symptoms and acute respiratory failure secondary to metabolic derangements requiring intubation.  Clinical Impression  Patient demonstrates good sitting balance, and independence in bed mobility. Pt needs heavy cues with STS for RW safety and CGA for safety. Patient is able to ambulate in room CGA with cuing for foot clearance. 2 instances where patient let go of RW and lost balance backward with max effort from PT needed to prevent her from falling. Patient very frustrated by this and cannot verbalize if she feels weak, unsteady, or what is causing this sudden LOB. PT encouraged patient that this session was her first time out of bed, which seemed to make patient feel better, and encouraged patient that PT will continue to work on this to increase her ind. Would benefit from skilled PT to address above deficits and promote optimal return to PLOF     Follow Up Recommendations SNF    Equipment Recommendations  Rolling walker with 5" wheels    Recommendations for Other Services Rehab consult     Precautions / Restrictions Precautions Precautions: Fall      Mobility  Bed Mobility Overal bed mobility: Independent Bed Mobility: Sit to Supine;Supine to Sit     Supine to sit: Independent Sit to supine: Independent      Transfers Overall transfer level: Needs assistance Equipment used: Rolling walker (2 wheeled) Transfers: Sit to/from Stand Sit to Stand: Supervision         General transfer comment: supervision with RW, cuing for set up and hand placement  Ambulation/Gait Ambulation/Gait assistance: Max assist;Min assist Gait Distance (Feet): 25 Feet Assistive device: Rolling  walker (2 wheeled) Gait Pattern/deviations: WFL(Within Functional Limits) Gait velocity: decreased   General Gait Details: Pt CGA for ambulation with 2 episodes of letting go of RW and PT holding up all of her body weight to help her back to controlled standing.  Stairs            Wheelchair Mobility    Modified Rankin (Stroke Patients Only)       Balance Overall balance assessment: Needs assistance Sitting-balance support: No upper extremity supported;Feet supported Sitting balance-Leahy Scale: Fair     Standing balance support: Bilateral upper extremity supported;During functional activity Standing balance-Leahy Scale: Poor                               Pertinent Vitals/Pain Pain Assessment: No/denies pain    Home Living Family/patient expects to be discharged to:: Private residence Living Arrangements: Spouse/significant other Available Help at Discharge: Friend(s) Type of Home: House Home Access: Stairs to enter Entrance Stairs-Rails: None Entrance Stairs-Number of Steps: 4 Home Layout: One level Home Equipment: None      Prior Function Level of Independence: Independent         Comments: Reports she lives with BF, but would like to stay with her daughter instead post D/C. Reports she does not drive, as she does not have a car, and is on diability from work     Hand Dominance   Dominant Hand: Right    Extremity/Trunk Assessment   Upper Extremity Assessment Upper Extremity Assessment: Generalized weakness    Lower Extremity Assessment Lower  Extremity Assessment: Generalized weakness    Cervical / Trunk Assessment Cervical / Trunk Assessment: Normal  Communication   Communication: No difficulties  Cognition Arousal/Alertness: Awake/alert Behavior During Therapy: WFL for tasks assessed/performed Overall Cognitive Status: Within Functional Limits for tasks assessed                                         General Comments      Exercises Other Exercises Other Exercises: Bed mobility modI with some increased time needed Other Exercises: STS with RW; relies heavily on PT cuing for set up, is able to stand CGA Other Exercises: AMB over 72ft: CGA with 2 episodes where patient lets go of RW and PT has to support all of patients body weight to regain controlled standing. Patient reports she does not know why this is happening. PT cuing for foot clearance and increased stride with minimal compliance.   Assessment/Plan    PT Assessment Patient needs continued PT services  PT Problem List Decreased strength;Decreased mobility;Decreased safety awareness;Decreased coordination;Decreased activity tolerance;Decreased balance;Decreased knowledge of use of DME       PT Treatment Interventions DME instruction;Therapeutic activities;Gait training;Therapeutic exercise;Patient/family education;Stair training;Balance training;Functional mobility training;Neuromuscular re-education;Manual techniques    PT Goals (Current goals can be found in the Care Plan section)  Acute Rehab PT Goals Patient Stated Goal: go home PT Goal Formulation: With patient Time For Goal Achievement: 01/14/19 Potential to Achieve Goals: Fair    Frequency Min 2X/week   Barriers to discharge Decreased caregiver support Patient does not know where she will return to following hospital stay    Co-evaluation               AM-PAC PT "6 Clicks" Mobility  Outcome Measure Help needed turning from your back to your side while in a flat bed without using bedrails?: None Help needed moving from lying on your back to sitting on the side of a flat bed without using bedrails?: None Help needed moving to and from a bed to a chair (including a wheelchair)?: A Little Help needed standing up from a chair using your arms (e.g., wheelchair or bedside chair)?: A Little Help needed to walk in hospital room?: Total Help needed climbing 3-5  steps with a railing? : Total 6 Click Score: 16    End of Session Equipment Utilized During Treatment: Gait belt Activity Tolerance: Patient tolerated treatment well Patient left: in bed;with call bell/phone within reach;with bed alarm set Nurse Communication: Mobility status PT Visit Diagnosis: Unsteadiness on feet (R26.81);Muscle weakness (generalized) (M62.81);Difficulty in walking, not elsewhere classified (R26.2);Dizziness and giddiness (R42);Other abnormalities of gait and mobility (R26.89)    Time: 9417-4081 PT Time Calculation (min) (ACUTE ONLY): 16 min   Charges:   PT Evaluation $PT Eval Moderate Complexity: 1 Mod PT Treatments $Therapeutic Activity: 8-22 mins       Shelton Silvas PT, DPT  Shelton Silvas 12/31/2018, 1:37 PM

## 2019-01-02 ENCOUNTER — Other Ambulatory Visit: Payer: Self-pay

## 2019-01-02 ENCOUNTER — Encounter: Payer: Self-pay | Admitting: Emergency Medicine

## 2019-01-02 ENCOUNTER — Inpatient Hospital Stay
Admission: EM | Admit: 2019-01-02 | Discharge: 2019-01-06 | DRG: 638 | Disposition: A | Discharge status: Home Health | Payer: Medicaid Other | Attending: Internal Medicine | Admitting: Internal Medicine

## 2019-01-02 DIAGNOSIS — I10 Essential (primary) hypertension: Secondary | ICD-10-CM | POA: Diagnosis present

## 2019-01-02 DIAGNOSIS — Z8249 Family history of ischemic heart disease and other diseases of the circulatory system: Secondary | ICD-10-CM | POA: Diagnosis not present

## 2019-01-02 DIAGNOSIS — Z20828 Contact with and (suspected) exposure to other viral communicable diseases: Secondary | ICD-10-CM | POA: Diagnosis present

## 2019-01-02 DIAGNOSIS — R51 Headache: Secondary | ICD-10-CM | POA: Diagnosis present

## 2019-01-02 DIAGNOSIS — E1140 Type 2 diabetes mellitus with diabetic neuropathy, unspecified: Secondary | ICD-10-CM | POA: Diagnosis present

## 2019-01-02 DIAGNOSIS — J45909 Unspecified asthma, uncomplicated: Secondary | ICD-10-CM | POA: Diagnosis present

## 2019-01-02 DIAGNOSIS — Z90710 Acquired absence of both cervix and uterus: Secondary | ICD-10-CM | POA: Diagnosis not present

## 2019-01-02 DIAGNOSIS — F17210 Nicotine dependence, cigarettes, uncomplicated: Secondary | ICD-10-CM | POA: Diagnosis present

## 2019-01-02 DIAGNOSIS — A0472 Enterocolitis due to Clostridium difficile, not specified as recurrent: Secondary | ICD-10-CM | POA: Diagnosis present

## 2019-01-02 DIAGNOSIS — Z9114 Patient's other noncompliance with medication regimen: Secondary | ICD-10-CM

## 2019-01-02 DIAGNOSIS — Z79899 Other long term (current) drug therapy: Secondary | ICD-10-CM

## 2019-01-02 DIAGNOSIS — Z7982 Long term (current) use of aspirin: Secondary | ICD-10-CM

## 2019-01-02 DIAGNOSIS — E1110 Type 2 diabetes mellitus with ketoacidosis without coma: Principal | ICD-10-CM | POA: Diagnosis present

## 2019-01-02 DIAGNOSIS — Z794 Long term (current) use of insulin: Secondary | ICD-10-CM

## 2019-01-02 LAB — BLOOD GAS, VENOUS
Acid-base deficit: 14.6 mmol/L — ABNORMAL HIGH (ref 0.0–2.0)
Bicarbonate: 12.7 mmol/L — ABNORMAL LOW (ref 20.0–28.0)
O2 Saturation: 65.8 %
Patient temperature: 37
pCO2, Ven: 34 mmHg — ABNORMAL LOW (ref 44.0–60.0)
pH, Ven: 7.18 — CL (ref 7.250–7.430)
pO2, Ven: 44 mmHg (ref 32.0–45.0)

## 2019-01-02 LAB — BASIC METABOLIC PANEL
Anion gap: 21 — ABNORMAL HIGH (ref 5–15)
Anion gap: 17 — ABNORMAL HIGH (ref 5–15)
Anion gap: 16 — ABNORMAL HIGH (ref 5–15)
BUN: 9 mg/dL (ref 6–20)
BUN: 8 mg/dL (ref 6–20)
BUN: 7 mg/dL (ref 6–20)
CO2: 12 mmol/L — ABNORMAL LOW (ref 22–32)
CO2: 11 mmol/L — ABNORMAL LOW (ref 22–32)
CO2: 11 mmol/L — ABNORMAL LOW (ref 22–32)
Calcium: 8.2 mg/dL — ABNORMAL LOW (ref 8.9–10.3)
Calcium: 7.7 mg/dL — ABNORMAL LOW (ref 8.9–10.3)
Calcium: 7.8 mg/dL — ABNORMAL LOW (ref 8.9–10.3)
Chloride: 104 mmol/L (ref 98–111)
Chloride: 111 mmol/L (ref 98–111)
Chloride: 113 mmol/L — ABNORMAL HIGH (ref 98–111)
Creatinine, Ser: 0.91 mg/dL (ref 0.44–1.00)
Creatinine, Ser: 0.77 mg/dL (ref 0.44–1.00)
Creatinine, Ser: 0.64 mg/dL (ref 0.44–1.00)
GFR calc Af Amer: >60 mL/min (ref >60)
GFR calc Af Amer: >60 mL/min (ref >60)
GFR calc Af Amer: >60 mL/min (ref >60)
GFR calc non Af Amer: >60 mL/min (ref >60)
GFR calc non Af Amer: >60 mL/min (ref >60)
GFR calc non Af Amer: >60 mL/min (ref >60)
Glucose, Bld: 400 mg/dL — ABNORMAL HIGH (ref 70–99)
Glucose, Bld: 258 mg/dL — ABNORMAL HIGH (ref 70–99)
Glucose, Bld: 174 mg/dL — ABNORMAL HIGH (ref 70–99)
Potassium: 4 mmol/L (ref 3.5–5.1)
Potassium: 3.3 mmol/L — ABNORMAL LOW (ref 3.5–5.1)
Potassium: 3 mmol/L — ABNORMAL LOW (ref 3.5–5.1)
Sodium: 137 mmol/L (ref 135–145)
Sodium: 139 mmol/L (ref 135–145)
Sodium: 140 mmol/L (ref 135–145)

## 2019-01-02 LAB — GLUCOSE, CAPILLARY
Glucose-Capillary: 381 mg/dL — ABNORMAL HIGH (ref 70–99)
Glucose-Capillary: 308 mg/dL — ABNORMAL HIGH (ref 70–99)
Glucose-Capillary: 301 mg/dL — ABNORMAL HIGH (ref 70–99)
Glucose-Capillary: 267 mg/dL — ABNORMAL HIGH (ref 70–99)
Glucose-Capillary: 268 mg/dL — ABNORMAL HIGH (ref 70–99)
Glucose-Capillary: 163 mg/dL — ABNORMAL HIGH (ref 70–99)
Glucose-Capillary: 122 mg/dL — ABNORMAL HIGH (ref 70–99)
Glucose-Capillary: 153 mg/dL — ABNORMAL HIGH (ref 70–99)
Glucose-Capillary: 163 mg/dL — ABNORMAL HIGH (ref 70–99)

## 2019-01-02 LAB — LIPASE, BLOOD: Lipase: 53 U/L — ABNORMAL HIGH (ref 11–51)

## 2019-01-02 LAB — CBC
HCT: 41 % (ref 36.0–46.0)
Hemoglobin: 12.9 g/dL (ref 12.0–15.0)
MCH: 30.2 pg (ref 26.0–34.0)
MCHC: 31.5 g/dL (ref 30.0–36.0)
MCV: 96 fL (ref 80.0–100.0)
Platelets: 264 10*3/uL (ref 150–400)
RBC: 4.27 MIL/uL (ref 3.87–5.11)
RDW: 13.8 % (ref 11.5–15.5)
WBC: 14.2 10*3/uL — ABNORMAL HIGH (ref 4.0–10.5)
nRBC: 0 % (ref 0.0–0.2)

## 2019-01-02 LAB — URINALYSIS, COMPLETE (UACMP) WITH MICROSCOPIC
Bacteria, UA: NONE SEEN
Bilirubin Urine: NEGATIVE
Glucose, UA: >=500 mg/dL — AB
Hgb urine dipstick: NEGATIVE
Ketones, ur: 80 mg/dL — AB
Leukocytes,Ua: NEGATIVE
Nitrite: NEGATIVE
Protein, ur: NEGATIVE mg/dL
Specific Gravity, Urine: 1.018 (ref 1.005–1.030)
Squamous Epithelial / HPF: NONE SEEN (ref 0–5)
pH: 6 (ref 5.0–8.0)

## 2019-01-02 LAB — SARS CORONAVIRUS 2 BY RT PCR (HOSPITAL ORDER, PERFORMED IN ~~LOC~~ HOSPITAL LAB): SARS Coronavirus 2: NEGATIVE

## 2019-01-02 LAB — ABO/RH: ABO/RH(D): A POS

## 2019-01-02 MED ORDER — POTASSIUM CHLORIDE 10 MEQ/100ML IV SOLN
10.0000 meq | INTRAVENOUS | Status: AC
  Administered 2019-01-02 (×2): 10 meq via INTRAVENOUS
  Filled 2019-01-02 (×2): qty 100

## 2019-01-02 MED ORDER — SODIUM CHLORIDE 0.9 % IV BOLUS
1000.0000 mL | Freq: Once | INTRAVENOUS | Status: AC
  Administered 2019-01-02: 1000 mL via INTRAVENOUS

## 2019-01-02 MED ORDER — ACETAMINOPHEN 650 MG RE SUPP: 650.0000 mg | Freq: Four times a day (QID) | RECTAL | Status: DC | PRN

## 2019-01-02 MED ORDER — DEXTROSE-NACL 5-0.45 % IV SOLN
INTRAVENOUS | Status: DC
  Administered 2019-01-02 – 2019-01-03 (×2): via INTRAVENOUS

## 2019-01-02 MED ORDER — QUETIAPINE FUMARATE 25 MG PO TABS: 25.0000 mg | ORAL_TABLET | ORAL | Status: DC | PRN

## 2019-01-02 MED ORDER — ENOXAPARIN SODIUM 40 MG/0.4ML ~~LOC~~ SOLN
40.0000 mg | SUBCUTANEOUS | Status: DC
  Administered 2019-01-02: 40 mg via SUBCUTANEOUS
  Filled 2019-01-02: qty 0.4

## 2019-01-02 MED ORDER — DILTIAZEM HCL ER COATED BEADS 120 MG PO CP24
120.0000 mg | ORAL_CAPSULE | Freq: Every day | ORAL | Status: DC
  Administered 2019-01-02 – 2019-01-04 (×3): 120 mg via ORAL
  Filled 2019-01-02 (×3): qty 1

## 2019-01-02 MED ORDER — FOLIC ACID 1 MG PO TABS
1.0000 mg | ORAL_TABLET | Freq: Every day | ORAL | Status: DC
  Administered 2019-01-03 – 2019-01-05 (×3): 1 mg via ORAL
  Filled 2019-01-02 (×3): qty 1

## 2019-01-02 MED ORDER — ALUM & MAG HYDROXIDE-SIMETH 200-200-20 MG/5ML PO SUSP
30.0000 mL | Freq: Once | ORAL | Status: AC
  Administered 2019-01-02: 30 mL via ORAL
  Filled 2019-01-02: qty 30

## 2019-01-02 MED ORDER — VITAMIN B-1 100 MG PO TABS
100.0000 mg | ORAL_TABLET | Freq: Every day | ORAL | Status: DC
  Administered 2019-01-03 – 2019-01-05 (×3): 100 mg via ORAL
  Filled 2019-01-02 (×3): qty 1

## 2019-01-02 MED ORDER — QUETIAPINE FUMARATE 200 MG PO TABS
200.0000 mg | ORAL_TABLET | Freq: Every day | ORAL | Status: DC
  Administered 2019-01-02 – 2019-01-04 (×3): 200 mg via ORAL
  Filled 2019-01-02 (×4): qty 1

## 2019-01-02 MED ORDER — ACETAMINOPHEN 325 MG PO TABS: 650.0000 mg | ORAL_TABLET | Freq: Four times a day (QID) | ORAL | Status: DC | PRN

## 2019-01-02 MED ORDER — SENNOSIDES-DOCUSATE SODIUM 8.6-50 MG PO TABS: 1.0000 | ORAL_TABLET | Freq: Every evening | ORAL | Status: DC | PRN

## 2019-01-02 MED ORDER — ONDANSETRON HCL 4 MG PO TABS: 4.0000 mg | ORAL_TABLET | Freq: Four times a day (QID) | ORAL | Status: DC | PRN

## 2019-01-02 MED ORDER — LIDOCAINE VISCOUS HCL 2 % MT SOLN
15.0000 mL | Freq: Once | OROMUCOSAL | Status: AC
  Administered 2019-01-02: 15 mL via ORAL
  Filled 2019-01-02: qty 15

## 2019-01-02 MED ORDER — INSULIN REGULAR(HUMAN) IN NACL 100-0.9 UT/100ML-% IV SOLN
INTRAVENOUS | Status: DC
  Administered 2019-01-02: 2.5 [IU]/h via INTRAVENOUS
  Administered 2019-01-03: 6.1 [IU]/h via INTRAVENOUS
  Filled 2019-01-02 (×2): qty 100

## 2019-01-02 MED ORDER — SODIUM CHLORIDE 0.9 % IV SOLN
INTRAVENOUS | Status: DC
  Administered 2019-01-02: 17:00:00 via INTRAVENOUS

## 2019-01-02 MED ORDER — LISINOPRIL 5 MG PO TABS
10.0000 mg | ORAL_TABLET | Freq: Every day | ORAL | Status: DC
  Administered 2019-01-03 – 2019-01-05 (×3): 10 mg via ORAL
  Filled 2019-01-02 (×3): qty 2

## 2019-01-02 MED ORDER — ADULT MULTIVITAMIN W/MINERALS CH
1.0000 | ORAL_TABLET | Freq: Every day | ORAL | Status: DC
  Administered 2019-01-03 – 2019-01-05 (×3): 1 via ORAL
  Filled 2019-01-02 (×3): qty 1

## 2019-01-02 MED ORDER — ASPIRIN EC 81 MG PO TBEC
81.0000 mg | DELAYED_RELEASE_TABLET | Freq: Every day | ORAL | Status: DC
  Administered 2019-01-03 – 2019-01-05 (×3): 81 mg via ORAL
  Filled 2019-01-02 (×3): qty 1

## 2019-01-02 MED ORDER — POTASSIUM CHLORIDE 10 MEQ/100ML IV SOLN
10.0000 meq | INTRAVENOUS | Status: AC
  Administered 2019-01-02 – 2019-01-03 (×5): 10 meq via INTRAVENOUS
  Filled 2019-01-02 (×5): qty 100

## 2019-01-02 MED ORDER — ONDANSETRON HCL 4 MG/2ML IJ SOLN
4.0000 mg | Freq: Four times a day (QID) | INTRAMUSCULAR | Status: DC | PRN
  Administered 2019-01-05: 4 mg via INTRAVENOUS
  Filled 2019-01-02: qty 2

## 2019-01-02 MED ORDER — ALBUTEROL SULFATE (2.5 MG/3ML) 0.083% IN NEBU: 2.5000 mg | INHALATION_SOLUTION | Freq: Four times a day (QID) | RESPIRATORY_TRACT | Status: DC | PRN

## 2019-01-02 NOTE — ED Notes (Signed)
Case management at bedside.

## 2019-01-02 NOTE — ED Notes (Signed)
Insulin dose verified by Anda Kraft, RN.

## 2019-01-02 NOTE — ED Triage Notes (Signed)
Pt to ED via EMS from home c/o elevated blood sugar and generalized malaise.  CBG on scene 497.  Pt also c/o SOB, abd pain, weak, increased thirst and urination.  Productive cough, yellow sputum.  Pt A&Ox4, moaning, requesting something to drink.

## 2019-01-02 NOTE — H&P (Signed)
[facility] NAME: Pamela Lane    MR#:  [id number]  DATE OF BIRTH:  [birth year]  DATE OF ADMISSION:  01/02/2019  PRIMARY CARE PHYSICIAN: [facility] [place]   REQUESTING/REFERRING PHYSICIAN: Dr Duffy Bruce  CHIEF COMPLAINT:   Chief Complaint  Patient presents with  . Fatigue  . Blood Sugar Problem    HISTORY OF PRESENT ILLNESS:  Pamela Lane  is a 58 y.o. female with a known history of diabetes presents with sugars being high and not feeling well. Patient was recently in the hospital for diabetic ketoacidosis.  She states that her Medicaid was taken away and she does not have any medications.  Patient states that she has been sick as a dog.  She states she has had nausea vomiting diarrhea and abdominal pain.  Her sugars have been high.  She is just not feeling well.  Hospitalist services were contacted for further evaluation for diabetic ketoacidosis.  PAST MEDICAL HISTORY:   Past Medical History:  Diagnosis Date  . Asthma   . Diabetes mellitus without complication ([place])   . Hypertension   . Mental disorder    pt reports 'I have all of them mental disorders'    PAST SURGICAL HISTORY:   Past Surgical History:  Procedure Laterality Date  . ABDOMINAL HYSTERECTOMY      SOCIAL HISTORY:   Social History   Tobacco Use  . Smoking status: Current Every Day Smoker    Packs/day: 0.10    Types: Cigarettes  . Smokeless tobacco: Never Used  Substance Use Topics  . Alcohol use: Yes    Alcohol/week: 40.0 standard drinks    Types: 40 Cans of beer per week    Comment: 3 big bottles of liquor    FAMILY HISTORY:   Family History  Problem Relation Age of Onset  . Hypertension Mother     DRUG ALLERGIES:  No Known Allergies  REVIEW OF SYSTEMS:  CONSTITUTIONAL: States she had fever fever.  Positive for fatigue and weakness.  EYES: Positive for blurred vision EARS, NOSE, AND THROAT: No  tinnitus or ear pain. No sore throat RESPIRATORY: Positive for cough, and shortness of breath.  No wheezing or hemoptysis.  CARDIOVASCULAR: Positive for chest pain, no orthopnea, edema.  GASTROINTESTINAL: Positive for nausea, vomiting, diarrhea and abdominal pain. No blood in bowel movements GENITOURINARY: No dysuria, hematuria.  ENDOCRINE: No polyuria, nocturia,  HEMATOLOGY: No anemia, easy bruising or bleeding SKIN: No rash or lesion. MUSCULOSKELETAL: No joint pain or arthritis.   NEUROLOGIC: No tingling, numbness, weakness.  PSYCHIATRY: No anxiety or depression.   MEDICATIONS AT HOME:   Prior to Admission medications   Medication Sig Start Date End Date Taking? Authorizing Provider  albuterol (PROVENTIL HFA;VENTOLIN HFA) 108 (90 Base) MCG/ACT inhaler Inhale 2 puffs into the lungs every 6 (six) hours as needed for wheezing or shortness of breath. 02/18/18   Dustin Flock, MD  aspirin EC 81 MG tablet Take 81 mg by mouth daily.    [provider]  blood glucose meter kit and supplies KIT Dispense based on patient and insurance preference. Use up to four times daily as directed. (FOR ICD-9 250.00, 250.01). 02/18/18   Dustin Flock, MD  buPROPion [facility] SR) 150 MG 12 hr tablet Take 1 tablet (150 mg total) by mouth 2 (two) times daily. 02/18/18 03/20/18  Dustin Flock, MD  cefUROXime (CEFTIN) 250 MG tablet Take 1 tablet (250 mg total) by mouth 2 (two)  times daily for 3 days. 12/31/18 01/03/19  Vaughan Basta, MD  diltiazem (CARDIZEM CD) 120 MG 24 hr capsule Take 1 capsule (120 mg total) by mouth daily. 01/01/19   Vaughan Basta, MD  folic acid (FOLVITE) 1 MG tablet Take 1 tablet (1 mg total) by mouth daily. 03/08/18   Nicholes Mango, MD  glipiZIDE (GLUCOTROL XL) 5 MG 24 hr tablet Take 5 mg by mouth daily with breakfast. 08/16/18 08/17/19  [provider]  insulin glargine (LANTUS) 100 UNIT/ML injection Inject 14 Units into the skin daily after breakfast. 08/17/18  08/17/19  [provider]  ipratropium-albuterol (DUONEB) 0.5-2.5 (3) MG/3ML SOLN Take 3 mLs by nebulization every 6 (six) hours as needed for wheezing, shortness of breath or cough. 02/23/18   [provider]  lisinopril (PRINIVIL) 10 MG tablet Take 1 tablet (10 mg total) by mouth daily. 02/18/18   Dustin Flock, MD  metFORMIN (GLUCOPHAGE) 500 MG tablet Take 1 tablet (500 mg total) by mouth 2 (two) times daily with a meal. 02/18/18   Dustin Flock, MD  Multiple Vitamin (MULTIVITAMIN WITH MINERALS) TABS tablet Take 1 tablet by mouth daily. 03/08/18   Gouru, Illene Silver, MD  QUEtiapine (SEROQUEL) 200 MG tablet Take 200 mg by mouth at bedtime. 03/02/18   [provider]  QUEtiapine (SEROQUEL) 25 MG tablet Take 25 mg by mouth every 4 (four) hours as needed. 03/02/18   [provider]  senna-docusate (SENOKOT-S) 8.6-50 MG tablet Take 1 tablet by mouth at bedtime as needed for mild constipation. 03/07/18   Nicholes Mango, MD  thiamine 100 MG tablet Take 1 tablet (100 mg total) by mouth daily. 03/08/18   Nicholes Mango, MD      VITAL SIGNS:  Blood pressure (!) 163/85, pulse (!) 101, temperature 97.6 F (36.4 C), temperature source Oral, resp. rate (!) 22, height 5' 3" (1.6 m), weight 113.4 kg, SpO2 100 %.  PHYSICAL EXAMINATION:  GENERAL:  58 y.o.-year-old patient lying in the bed with no acute distress.  EYES: Pupils equal, round, reactive to light and accommodation. No scleral icterus. Extraocular muscles intact.  HEENT: Head atraumatic, normocephalic. Oropharynx and nasopharynx clear.  NECK:  Supple, no jugular venous distention. No thyroid enlargement, no tenderness.  LUNGS: Normal breath sounds bilaterally, no wheezing, rales,rhonchi or crepitation. No use of accessory muscles of respiration.  CARDIOVASCULAR: S1, S2 normal. No murmurs, rubs, or gallops.  ABDOMEN: Soft, nontender, nondistended. Bowel sounds present. No organomegaly or mass.  EXTREMITIES: No pedal edema,  cyanosis, or clubbing.  NEUROLOGIC: Cranial nerves II through XII are intact. Muscle strength 5/5 in all extremities. Sensation intact. Gait not checked.  PSYCHIATRIC: The patient is alert and oriented x 3.  SKIN: No rash, lesion, or ulcer.   LABORATORY PANEL:   CBC Recent Labs  Lab 01/02/19 1134  WBC 14.2*  HGB 12.9  HCT 41.0  PLT 264   ------------------------------------------------------------------------------------------------------------------  Chemistries  Recent Labs  Lab 12/28/18 0059  12/30/18 0520  01/02/19 1134  NA 146*   < > 144   < > 137  K 3.5   < > 3.0*   < > 4.0  CL 116*   < > 110   < > 104  CO2 17*   < > 24   < > 12*  GLUCOSE 156*   < > 234*   < > 400*  BUN 29*   < > 18   < > 9  CREATININE 1.45*   < > 0.90   < >  0.91  CALCIUM 8.0*   < > 7.7*   < > 8.2*  MG  --    < > 1.9  --   --   AST 20  --   --   --   --   ALT 11  --   --   --   --   ALKPHOS 63  --   --   --   --   BILITOT 0.7  --   --   --   --    < > = values in this interval not displayed.   ------------------------------------------------------------------------------------------------------------------     IMPRESSION AND PLAN:   1.  Diabetic ketoacidosis secondary to noncompliance with insulin.  Patient placed back on insulin drip.  Will admit to the CCU stepdown.  Check sugars every hour.  Care manager consultation to figure out what we can do with her medications.  COVID-19 testing done a few days ago was negative will send another 1. 2.  Nausea vomiting abdominal pain and diarrhea.  Send off stool for C. difficile.  Since she was recently on antibiotics. 3.  Asthma.  Respiratory status stable PRN inhaler 4.  Hypertension and tachycardia restart Cardizem and lisinopril. 5.  Restart Seroquel.   All the records are reviewed and case discussed with ED provider. Management plans discussed with the patient, and she refused me calling anybody else.  CODE STATUS: Full code  TOTAL TIME  TAKING CARE OF THIS PATIENT: 45 minutes.    Loletha Grayer M.D on 01/02/2019 at 3:15 PM  Between 7am to 6pm - Pager - [phone]  After 6pm call admission pager [phone]  [facility]  [phone]  CC: Primary care physician; [facility] [place]

## 2019-01-02 NOTE — ED Provider Notes (Signed)
[facility] Emergency Department Provider Note  ____________________________________________   First MD Initiated Contact with Patient 01/02/19 1123     (approximate)  I have reviewed the triage vital signs and the nursing notes.   HISTORY  Chief Complaint Fatigue and Blood Sugar Problem    HPI Pamela Lane is a 58 y.o. female with extensive past medical history as below including history of chronic nonadherence with insulin, status post recent admissions for DKA, most recently last week, here with shortness of breath, polyuria, polydipsia.  The patient states that she was unable to fill her medications since her recent visit.  She has not had any of her insulin or metformin or other meds.  She states over the last several days, she is developed progressively worsening shortness of breath, dry mouth, frequent urination.  She has had mild nausea and vomiting.  Symptoms feel similar to her previous episodes of DKA.  No fevers.  No sputum production.  No other complaints.        Past Medical History:  Diagnosis Date  . Asthma   . Diabetes mellitus without complication ([place])   . Hypertension   . Mental disorder    pt reports 'I have all of them mental disorders'    Patient Active Problem List   Diagnosis Date Noted  . Acute pancreatitis 05/15/2016  . AKI (acute kidney injury) ([place])   . Diabetic ketoacidosis without coma associated with type 2 diabetes mellitus ([place])   . Hyperglycemia due to type 2 diabetes mellitus ([place]) 01/13/2016  . UTI (urinary tract infection) 01/13/2016  . DKA (diabetic ketoacidoses) ([place]) 01/08/2016  . Hyponatremia 01/08/2016  . Dehydration 01/08/2016  . Urinary tract infection 01/08/2016  . Upper abdominal pain 01/08/2016  . Nausea & vomiting 01/08/2016    Past Surgical History:  Procedure Laterality Date  . ABDOMINAL HYSTERECTOMY      Prior to Admission medications   Medication Sig Start Date End Date Taking?  Authorizing Provider  albuterol (PROVENTIL HFA;VENTOLIN HFA) 108 (90 Base) MCG/ACT inhaler Inhale 2 puffs into the lungs every 6 (six) hours as needed for wheezing or shortness of breath. 02/18/18   Dustin Flock, MD  aspirin EC 81 MG tablet Take 81 mg by mouth daily.    [provider]  blood glucose meter kit and supplies KIT Dispense based on patient and insurance preference. Use up to four times daily as directed. (FOR ICD-9 250.00, 250.01). 02/18/18   Dustin Flock, MD  buPROPion [facility] SR) 150 MG 12 hr tablet Take 1 tablet (150 mg total) by mouth 2 (two) times daily. 02/18/18 03/20/18  Dustin Flock, MD  cefUROXime (CEFTIN) 250 MG tablet Take 1 tablet (250 mg total) by mouth 2 (two) times daily for 3 days. 12/31/18 01/03/19  Vaughan Basta, MD  diltiazem (CARDIZEM CD) 120 MG 24 hr capsule Take 1 capsule (120 mg total) by mouth daily. 01/01/19   Vaughan Basta, MD  folic acid (FOLVITE) 1 MG tablet Take 1 tablet (1 mg total) by mouth daily. 03/08/18   Nicholes Mango, MD  glipiZIDE (GLUCOTROL XL) 5 MG 24 hr tablet Take 5 mg by mouth daily with breakfast. 08/16/18 08/17/19  [provider]  insulin glargine (LANTUS) 100 UNIT/ML injection Inject 14 Units into the skin daily after breakfast. 08/17/18 08/17/19  [provider]  ipratropium-albuterol (DUONEB) 0.5-2.5 (3) MG/3ML SOLN Take 3 mLs by nebulization every 6 (six) hours as needed for wheezing, shortness of breath or cough. 02/23/18   [provider]  lisinopril (PRINIVIL) 10 MG tablet Take 1 tablet (10 mg total) by mouth daily. 02/18/18   Dustin Flock, MD  metFORMIN (GLUCOPHAGE) 500 MG tablet Take 1 tablet (500 mg total) by mouth 2 (two) times daily with a meal. 02/18/18   Dustin Flock, MD  Multiple Vitamin (MULTIVITAMIN WITH MINERALS) TABS tablet Take 1 tablet by mouth daily. 03/08/18   Gouru, Illene Silver, MD  QUEtiapine (SEROQUEL) 200 MG tablet Take 200 mg by mouth at bedtime. 03/02/18   [provider]  QUEtiapine (SEROQUEL) 25 MG tablet Take 25 mg by mouth every 4 (four) hours as needed. 03/02/18   [provider]  senna-docusate (SENOKOT-S) 8.6-50 MG tablet Take 1 tablet by mouth at bedtime as needed for mild constipation. 03/07/18   Nicholes Mango, MD  thiamine 100 MG tablet Take 1 tablet (100 mg total) by mouth daily. 03/08/18   Nicholes Mango, MD    Allergies Patient has no known allergies.  Family History  Problem Relation Age of Onset  . Hypertension Mother     Social History Social History   Tobacco Use  . Smoking status: Current Every Day Smoker    Packs/day: 0.10    Types: Cigarettes  . Smokeless tobacco: Never Used  Substance Use Topics  . Alcohol use: Yes    Alcohol/week: 40.0 standard drinks    Types: 40 Cans of beer per week    Comment: 3 big bottles of liquor  . Drug use: Yes    Types: Marijuana, Cocaine    Review of Systems  Review of Systems  Constitutional: Positive for fatigue. Negative for fever.  HENT: Negative for congestion and sore throat.   Eyes: Negative for visual disturbance.  Respiratory: Positive for shortness of breath. Negative for cough.   Cardiovascular: Negative for chest pain.  Gastrointestinal: Positive for nausea and vomiting. Negative for abdominal pain and diarrhea.  Endocrine: Positive for polydipsia and polyuria.  Genitourinary: Negative for flank pain.  Musculoskeletal: Negative for back pain and neck pain.  Skin: Negative for rash and wound.  Neurological: Positive for weakness.     ____________________________________________  PHYSICAL EXAM:      VITAL SIGNS: ED Triage Vitals  Enc Vitals Group     BP 01/02/19 1108 (!) 149/63     Pulse Rate 01/02/19 1112 100     Resp 01/02/19 1112 11     Temp 01/02/19 1114 97.6 F (36.4 C)     Temp Source 01/02/19 1114 Oral     SpO2 01/02/19 1112 98 %     Weight 01/02/19 1115 250 lb (113.4 kg)     Height 01/02/19 1115 _0  (1.6 m)     Head Circumference --       Peak Flow --      Pain Score 01/02/19 1115 10     Pain Loc --      Pain Edu? --      Excl. in [place]? --      Physical Exam Vitals signs and nursing note reviewed.  Constitutional:      General: She is not in acute distress.    Appearance: She is well-developed. She is not ill-appearing.  HENT:     Head: Normocephalic and atraumatic.     Comments: Dry MM Eyes:     Conjunctiva/sclera: Conjunctivae normal.  Neck:     Musculoskeletal: Neck supple.  Cardiovascular:     Rate and Rhythm: Regular rhythm. Tachycardia present.     Heart sounds: Normal heart  sounds. No murmur. No friction rub.  Pulmonary:     Effort: Pulmonary effort is normal. No respiratory distress.     Breath sounds: Normal breath sounds. No wheezing or rales.  Abdominal:     General: There is no distension.     Palpations: Abdomen is soft.     Tenderness: There is no abdominal tenderness.  Skin:    General: Skin is warm.     Capillary Refill: Capillary refill takes less than 2 seconds.  Neurological:     Mental Status: She is alert and oriented to person, place, and time.     Motor: No abnormal muscle tone.       ____________________________________________   LABS (all labs ordered are listed, but only abnormal results are displayed)  Labs Reviewed  BASIC METABOLIC PANEL - Abnormal; Notable for the following components:      Result Value   CO2 12 (*)    Glucose, Bld 400 (*)    Calcium 8.2 (*)    Anion gap 21 (*)    All other components within normal limits  CBC - Abnormal; Notable for the following components:   WBC 14.2 (*)    All other components within normal limits  URINALYSIS, COMPLETE (UACMP) WITH MICROSCOPIC - Abnormal; Notable for the following components:   Color, Urine STRAW (*)    APPearance CLEAR (*)    Glucose, UA >=500 (*)    Ketones, ur 80 (*)    All other components within normal limits  BLOOD GAS, VENOUS - Abnormal; Notable for the following components:   pH, Ven 7.18 (*)     pCO2, Ven 34 (*)    Bicarbonate 12.7 (*)    Acid-base deficit 14.6 (*)    All other components within normal limits  GLUCOSE, CAPILLARY - Abnormal; Notable for the following components:   Glucose-Capillary 381 (*)    All other components within normal limits  CBG MONITORING, ED    ____________________________________________  EKG: None ________________________________________  RADIOLOGY All imaging, including plain films, CT scans, and ultrasounds, independently reviewed by me, and interpretations confirmed via formal radiology reads.  ED MD interpretation:   None  Official radiology report(s): No results found.  ____________________________________________  PROCEDURES   Procedure(s) performed (including Critical Care):  .Critical Care Performed by: Duffy Bruce, MD Authorized by: Duffy Bruce, MD   Critical care provider statement:    Critical care time (minutes):  35   Critical care time was exclusive of:  Separately billable procedures and treating other patients and teaching time   Critical care was necessary to treat or prevent imminent or life-threatening deterioration of the following conditions:  Circulatory failure and endocrine crisis   Critical care was time spent personally by me on the following activities:  Development of treatment plan with patient or surrogate, discussions with consultants, evaluation of patient's response to treatment, examination of patient, obtaining history from patient or surrogate, ordering and performing treatments and interventions, ordering and review of laboratory studies, ordering and review of radiographic studies, pulse oximetry, re-evaluation of patient's condition and review of old charts   I assumed direction of critical care for this patient from another provider in my specialty: no      ____________________________________________  INITIAL IMPRESSION / MDM / [place] / ED COURSE  As part of my medical  decision making, I reviewed the following data within the electronic MEDICAL RECORD NUMBER Notes from prior ED visits and [place] Controlled Substance Database      *  LAIYLA SLAGEL was evaluated in Emergency Department on 01/02/2019 for the symptoms described in the history of present illness. She was evaluated in the context of the global COVID-19 pandemic, which necessitated consideration that the patient might be at risk for infection with the SARS-CoV-2 virus that causes COVID-19. Institutional protocols and algorithms that pertain to the evaluation of patients at risk for COVID-19 are in a state of rapid change based on information released by regulatory bodies including the CDC and federal and state organizations. These policies and algorithms were followed during the patient's care in the ED.  Some ED evaluations and interventions may be delayed as a result of limited staffing during the pandemic.*      Medical Decision Making: 58 year old female with history of recurrent DKA here with recurrent DKA likely secondary to medication nonadherence.  pH 7.18, bicarb 12, with anion gap of 21.  No apparent infectious triggers.  She was given fluids, will start insulin drip.  Supplemental potassium provided.  ____________________________________________  FINAL CLINICAL IMPRESSION(S) / ED DIAGNOSES  Final diagnoses:  Diabetic ketoacidosis without coma associated with type 2 diabetes mellitus ([place])     MEDICATIONS GIVEN DURING THIS VISIT:  Medications  insulin regular, human (MYXREDLIN) 100 units/ 100 mL infusion (has no administration in time range)  0.9 %  sodium chloride infusion (has no administration in time range)  dextrose 5 %-0.45 % sodium chloride infusion (has no administration in time range)  potassium chloride 10 mEq in 100 mL IVPB (has no administration in time range)  sodium chloride 0.9 % bolus 1,000 mL (1,000 mLs Intravenous New Bag/Given 01/02/19 1229)     ED Discharge Orders     None       Note:  This document was prepared using Dragon voice recognition software and may include unintentional dictation errors.   Duffy Bruce, MD 01/02/19 [phone]

## 2019-01-02 NOTE — ED Notes (Signed)
ED TO INPATIENT HANDOFF REPORT  ED Nurse Name and Phone #: [name] [phone]  S Name/Age/Gender Pamela Lane 58 y.o. female Room/Bed: ED08A/ED08A  Code Status   Code Status: Full Code  Home/SNF/Other Home Patient oriented to: self, place, time and situation Is this baseline? Yes   Triage Complete: Triage complete  Chief Complaint dka  Triage Note Pt to ED via EMS from home c/o elevated blood sugar and generalized malaise.  CBG on scene 497.  Pt also c/o SOB, abd pain, weak, increased thirst and urination.  Productive cough, yellow sputum.  Pt A&Ox4, moaning, requesting something to drink.   Allergies No Known Allergies  Level of Care/Admitting Diagnosis ED Disposition    ED Disposition Condition Comment   Admit  Hospital Area: [facility] [[id number]]  Level of Care: Stepdown [14]  Covid Evaluation: Person Under Investigation (PUI)  Isolation Risk Level: Low Risk/Droplet (Less than 4L [place] supplementation)  Diagnosis: DKA (diabetic ketoacidoses) [facility](HCC) [[id number]][[id number]]  Admitting Physician: Alford HighlandWIETING, RICHARD [[id number]]  Attending Physician: Alford HighlandWIETING, RICHARD [phone][[id number]]  Estimated length of stay: past midnight tomorrow  Certification:: I certify this patient will need inpatient services for at least 2 midnights  PT Class (Do Not Modify): Inpatient [101]  PT Acc Code (Do Not Modify): Private [1]       B Medical/Surgery History Past Medical History:  Diagnosis Date  . Asthma   . Diabetes mellitus without complication (HCC)   . Hypertension   . Mental disorder    pt reports 'I have all of them mental disorders'   Past Surgical History:  Procedure Laterality Date  . ABDOMINAL HYSTERECTOMY       A IV Location/Drains/Wounds Patient Lines/Drains/Airways Status   Active Line/Drains/Airways    Name:   Placement date:   Placement time:   Site:   Days:   Peripheral IV 01/02/19 Right Hand   01/02/19    1158    Hand   less than 1   Peripheral IV 01/02/19 Left  Antecubital   01/02/19    1356    Antecubital   less than 1          Intake/Output Last 24 hours  Intake/Output Summary (Last 24 hours) at 01/02/2019 1541 Last data filed at 01/02/2019 1541 Gross per 24 hour  Intake 1000 ml  Output -  Net 1000 ml    Labs/Imaging Results for orders placed or performed during the hospital encounter of 01/02/19 (from the past 48 hour(s))  Glucose, capillary     Status: Abnormal   Collection Time: 01/02/19 11:32 AM  Result Value Ref Range   Glucose-Capillary 381 (H) 70 - 99 mg/dL  Basic metabolic panel     Status: Abnormal   Collection Time: 01/02/19 11:34 AM  Result Value Ref Range   Sodium 137 135 - 145 mmol/L    Comment: LYTES REPEATED FMW   Potassium 4.0 3.5 - 5.1 mmol/L    Comment: HEMOLYSIS AT THIS LEVEL MAY AFFECT RESULT   Chloride 104 98 - 111 mmol/L   CO2 12 (L) 22 - 32 mmol/L   Glucose, Bld 400 (H) 70 - 99 mg/dL   BUN 9 6 - 20 mg/dL   Creatinine, Ser 7.820.91 0.44 - 1.00 mg/dL   Calcium 8.2 (L) 8.9 - 10.3 mg/dL   GFR calc non Af Amer >60 >60 mL/min   GFR calc Af Amer >60 >60 mL/min   Anion gap 21 (H) 5 - 15    Comment: Performed at [facility]  Lab, [address] [place], [place] [address]  CBC     Status: Abnormal   Collection Time: 01/02/19 11:34 AM  Result Value Ref Range   WBC 14.2 (H) 4.0 - 10.5 K/uL   RBC 4.27 3.87 - 5.11 MIL/uL   Hemoglobin 12.9 12.0 - 15.0 g/dL   HCT 02.741.0 25.336.0 - 66.446.0 %   MCV 96.0 80.0 - 100.0 fL   MCH 30.2 26.0 - 34.0 pg   MCHC 31.5 30.0 - 36.0 g/dL   RDW 40.313.8 47.411.5 - 25.915.5 %   Platelets 264 150 - 400 K/uL   nRBC 0.0 0.0 - 0.2 %    Comment: Performed at [facility], [address] [place], [place] [address]  Urinalysis, Complete w Microscopic     Status: Abnormal   Collection Time: 01/02/19 11:34 AM  Result Value Ref Range   Color, Urine STRAW (A) YELLOW   APPearance CLEAR (A) CLEAR   Specific Gravity, Urine 1.018 1.005 - 1.030   pH 6.0 5.0 - 8.0   Glucose, UA >=500 (A) NEGATIVE  mg/dL   Hgb urine dipstick NEGATIVE NEGATIVE   Bilirubin Urine NEGATIVE NEGATIVE   Ketones, ur 80 (A) NEGATIVE mg/dL   Protein, ur NEGATIVE NEGATIVE mg/dL   Nitrite NEGATIVE NEGATIVE   Leukocytes,Ua NEGATIVE NEGATIVE   WBC, UA 0-5 0 - 5 WBC/hpf   Bacteria, UA NONE SEEN NONE SEEN   Squamous Epithelial / LPF NONE SEEN 0 - 5   Mucus PRESENT     Comment: Performed at [facility], [address] [place], [place] [address]  Blood gas, venous     Status: Abnormal   Collection Time: 01/02/19 11:34 AM  Result Value Ref Range   pH, Ven 7.18 (LL) 7.250 - 7.430    Comment: CRITICAL RESULT CALLED TO, READ BACK BY AND VERIFIED WITH: NOTIFIED STEPHANIE RUDD, RN OF THE CRITICAL VALUES ON 01/02/2019 AT 1244. DW, RRT.    pCO2, Ven 34 (L) 44.0 - 60.0 mmHg   pO2, Ven 44.0 32.0 - 45.0 mmHg   Bicarbonate 12.7 (L) 20.0 - 28.0 mmol/L   Acid-base deficit 14.6 (H) 0.0 - 2.0 mmol/L   O2 Saturation 65.8 %   Patient temperature 37.0    Collection site VEIN    Sample type VEIN     Comment: Performed at [facility], [address] [place], [place] [address]  Glucose, capillary     Status: Abnormal   Collection Time: 01/02/19  2:11 PM  Result Value Ref Range   Glucose-Capillary 308 (H) 70 - 99 mg/dL  Glucose, capillary     Status: Abnormal   Collection Time: 01/02/19  3:24 PM  Result Value Ref Range   Glucose-Capillary 301 (H) 70 - 99 mg/dL   No results found.  Pending Labs Unresulted Labs (From admission, onward)    Start     Ordered   01/09/19 0500  Creatinine, serum  (enoxaparin (LOVENOX)    CrCl >/= 30 ml/min)  Weekly,   STAT    Comments: while on enoxaparin therapy    01/02/19 1512   01/03/19 0500  Basic metabolic panel  Tomorrow morning,   STAT     01/02/19 1512   01/03/19 0500  CBC  Tomorrow morning,   STAT     01/02/19 1512   01/02/19 1700  Basic metabolic panel  Once,   STAT     01/02/19 1510   01/02/19 1521  Lipase, blood  Add-on,   AD  01/02/19 1520    01/02/19 1518  C difficile quick scan w PCR reflex  (C Difficile quick screen w PCR reflex panel)  Once, for 24 hours,   STAT     01/02/19 1517   01/02/19 1500  SARS Coronavirus 2 [facility] order, Performed in [place] hospital lab)  (Novel Coronavirus, [facility] Order))  Once,   STAT     01/02/19 1459   01/02/19 1500  ABO/Rh  Once,   STAT     01/02/19 1459          Vitals/Pain Today's Vitals   01/02/19 1400 01/02/19 1430 01/02/19 1520 01/02/19 1523  BP:  (!) 163/85 (!) 156/77   Pulse: (!) 104 (!) 101  (!) 103  Resp: 19 (!) 22  16  Temp:      TempSrc:      SpO2: 100% 100%  98%  Weight:      Height:      PainSc:        Isolation Precautions Enteric precautions (UV disinfection)  Medications Medications  insulin regular, human (MYXREDLIN) 100 units/ 100 mL infusion (4.8 Units/hr Intravenous Rate/Dose Change 01/02/19 1525)  0.9 %  sodium chloride infusion (has no administration in time range)  dextrose 5 %-0.45 % sodium chloride infusion (has no administration in time range)  potassium chloride 10 mEq in 100 mL IVPB (10 mEq Intravenous New Bag/Given 01/02/19 1404)  enoxaparin (LOVENOX) injection 40 mg (has no administration in time range)  acetaminophen (TYLENOL) tablet 650 mg (has no administration in time range)    Or  acetaminophen (TYLENOL) suppository 650 mg (has no administration in time range)  ondansetron (ZOFRAN) tablet 4 mg (has no administration in time range)    Or  ondansetron (ZOFRAN) injection 4 mg (has no administration in time range)  aspirin EC tablet 81 mg (has no administration in time range)  diltiazem (CARDIZEM CD) 24 hr capsule 120 mg (has no administration in time range)  lisinopril (ZESTRIL) tablet 10 mg (has no administration in time range)  QUEtiapine (SEROQUEL) tablet 200 mg (has no administration in time range)  QUEtiapine (SEROQUEL) tablet 25 mg (has no administration in time range)  senna-docusate (Senokot-S) tablet 1 tablet (has no  administration in time range)  folic acid (FOLVITE) tablet 1 mg (has no administration in time range)  multivitamin with minerals tablet 1 tablet (has no administration in time range)  thiamine (VITAMIN B-1) tablet 100 mg (has no administration in time range)  albuterol (VENTOLIN HFA) 108 (90 Base) MCG/ACT inhaler 2 puff (has no administration in time range)  sodium chloride 0.9 % bolus 1,000 mL (0 mLs Intravenous Stopped 01/02/19 1541)  alum & mag hydroxide-simeth (MAALOX/MYLANTA) 200-200-20 MG/5ML suspension 30 mL (30 mLs Oral Given 01/02/19 1501)    And  lidocaine (XYLOCAINE) 2 % viscous mouth solution 15 mL (15 mLs Oral Given 01/02/19 1501)    Mobility walks Low fall risk   Focused Assessments DKA   R Recommendations: See Admitting Provider Note  Report given to:   Additional Notes:

## 2019-01-02 NOTE — TOC Initial Note (Addendum)
Transition of Care [facility]) - Initial/Assessment Note    Patient Details  Name: Pamela Lane MRN: [id number] Date of Birth: 02/09/1961  Transition of [facility]) CM/SW Contact:    Marshell Garfinkel, RN Phone Number: 01/02/2019, 12:39 PM  Clinical Narrative:                  Checking Medicaid status for this patient. PCP [facility] last seen 12/16/18. She has been going to [facility] for some medications however [facility] has [facility] a refill to [facility]. Patient states her Medicaid is not working and she doesn't know why. She is from home with a friend. She uses [employer] for transportation however states a friend will provide transportation to home from home.  Independent with mobility per patient. Patient does have Medicaid to pay for Medications.        Patient Goals and CMS Choice        Expected Discharge Plan and Services     Discharge Planning Services: Medication Assistance                                          Prior Living Arrangements/Services                       Activities of Daily Living      Permission Sought/Granted                  Emotional Assessment              Admission diagnosis:  dka Patient Active Problem List   Diagnosis Date Noted  . Acute pancreatitis 05/15/2016  . AKI (acute kidney injury) ([place])   . Diabetic ketoacidosis without coma associated with type 2 diabetes mellitus ([place])   . Hyperglycemia due to type 2 diabetes mellitus ([place]) 01/13/2016  . UTI (urinary tract infection) 01/13/2016  . DKA (diabetic ketoacidoses) ([place]) 01/08/2016  . Hyponatremia 01/08/2016  . Dehydration 01/08/2016  . Urinary tract infection 01/08/2016  . Upper abdominal pain 01/08/2016  . Nausea & vomiting 01/08/2016   PCP:  [facility], [place]:   [place], [place] [address] Phone: [phone] Fax:  [place] [address]), [place]) [place] [address] Phone: [phone] Fax: [phone]     Social Determinants of Health (SDOH) Interventions    Readmission Risk Interventions No flowsheet data found.

## 2019-01-02 NOTE — TOC Transition Note (Signed)
Transition of Care [facility]) - CM/SW Discharge Note   Patient Details  Name: Pamela Lane MRN: [id number] Date of Birth: 07-22-1960  Transition of Care [facility]) CM/SW Contact:  Latanya Maudlin, RN Phone Number: 01/02/2019, 8:25 AM   Clinical Narrative:  Late entry. Patient was seen in hallway as she was being contained from leaving by security. Appartently the patient had fallen in the room and had been wandering waiting for a ride. Security and staff contacted several people at patients request attempting to get transportation. Patient refusing recommendation of SNF. Patient and I discussed home health. Patient admits she is weak and "cant get her legs right" but continues to repeat "I will be fine yall just need to get me a ride". Patient refuses home health and does not know which address she will be staying at as she stays at different locations for periods at a time. Patient does agree to a rolling walker. Obtained from [employer] and brought to patient. Patient eventually contatced an ex boyfriend for transportation.       Final next level of care: Home w Home Health Services Barriers to Discharge: No Barriers Identified   Patient Goals and CMS Choice   CMS Medicare.gov Compare Post Acute Care list provided to:: Patient Choice offered to / list presented to : Patient  Discharge Placement                       Discharge Plan and Services                DME Arranged: Walker rolling DME Agency: [employer] Date DME Agency Contacted: 12/31/18 Time DME Agency Contacted: 1500 Representative spoke with at DME Agency: brad [place]: Refused [place]          Social Determinants of Health ([place]) Interventions     Readmission Risk Interventions No flowsheet data found.

## 2019-01-02 NOTE — ED Notes (Signed)
Admitting MD at bedside.

## 2019-01-03 LAB — GLUCOSE, CAPILLARY
Glucose-Capillary: 119 mg/dL — ABNORMAL HIGH (ref 70–99)
Glucose-Capillary: 122 mg/dL — ABNORMAL HIGH (ref 70–99)
Glucose-Capillary: 122 mg/dL — ABNORMAL HIGH (ref 70–99)
Glucose-Capillary: 126 mg/dL — ABNORMAL HIGH (ref 70–99)
Glucose-Capillary: 149 mg/dL — ABNORMAL HIGH (ref 70–99)
Glucose-Capillary: 152 mg/dL — ABNORMAL HIGH (ref 70–99)
Glucose-Capillary: 153 mg/dL — ABNORMAL HIGH (ref 70–99)
Glucose-Capillary: 163 mg/dL — ABNORMAL HIGH (ref 70–99)
Glucose-Capillary: 165 mg/dL — ABNORMAL HIGH (ref 70–99)
Glucose-Capillary: 172 mg/dL — ABNORMAL HIGH (ref 70–99)
Glucose-Capillary: 177 mg/dL — ABNORMAL HIGH (ref 70–99)
Glucose-Capillary: 180 mg/dL — ABNORMAL HIGH (ref 70–99)
Glucose-Capillary: 181 mg/dL — ABNORMAL HIGH (ref 70–99)
Glucose-Capillary: 186 mg/dL — ABNORMAL HIGH (ref 70–99)
Glucose-Capillary: 191 mg/dL — ABNORMAL HIGH (ref 70–99)
Glucose-Capillary: 196 mg/dL — ABNORMAL HIGH (ref 70–99)
Glucose-Capillary: 207 mg/dL — ABNORMAL HIGH (ref 70–99)
Glucose-Capillary: 213 mg/dL — ABNORMAL HIGH (ref 70–99)
Glucose-Capillary: 221 mg/dL — ABNORMAL HIGH (ref 70–99)
Glucose-Capillary: 248 mg/dL — ABNORMAL HIGH (ref 70–99)

## 2019-01-03 LAB — BASIC METABOLIC PANEL
Anion gap: 12 (ref 5–15)
Anion gap: 12 (ref 5–15)
Anion gap: 11 (ref 5–15)
Anion gap: 9 (ref 5–15)
BUN: 6 mg/dL (ref 6–20)
BUN: 5 mg/dL — ABNORMAL LOW (ref 6–20)
BUN: <5 mg/dL — ABNORMAL LOW (ref 6–20)
BUN: <5 mg/dL — ABNORMAL LOW (ref 6–20)
CO2: 14 mmol/L — ABNORMAL LOW (ref 22–32)
CO2: 14 mmol/L — ABNORMAL LOW (ref 22–32)
CO2: 17 mmol/L — ABNORMAL LOW (ref 22–32)
CO2: 18 mmol/L — ABNORMAL LOW (ref 22–32)
Calcium: 7.8 mg/dL — ABNORMAL LOW (ref 8.9–10.3)
Calcium: 7.9 mg/dL — ABNORMAL LOW (ref 8.9–10.3)
Calcium: 8.3 mg/dL — ABNORMAL LOW (ref 8.9–10.3)
Calcium: 8 mg/dL — ABNORMAL LOW (ref 8.9–10.3)
Chloride: 112 mmol/L — ABNORMAL HIGH (ref 98–111)
Chloride: 113 mmol/L — ABNORMAL HIGH (ref 98–111)
Chloride: 109 mmol/L (ref 98–111)
Chloride: 109 mmol/L (ref 98–111)
Creatinine, Ser: 0.65 mg/dL (ref 0.44–1.00)
Creatinine, Ser: 0.63 mg/dL (ref 0.44–1.00)
Creatinine, Ser: 0.66 mg/dL (ref 0.44–1.00)
Creatinine, Ser: 0.53 mg/dL (ref 0.44–1.00)
GFR calc Af Amer: >60 mL/min (ref >60)
GFR calc Af Amer: >60 mL/min (ref >60)
GFR calc Af Amer: >60 mL/min (ref >60)
GFR calc Af Amer: >60 mL/min (ref >60)
GFR calc non Af Amer: >60 mL/min (ref >60)
GFR calc non Af Amer: >60 mL/min (ref >60)
GFR calc non Af Amer: >60 mL/min (ref >60)
GFR calc non Af Amer: >60 mL/min (ref >60)
Glucose, Bld: 179 mg/dL — ABNORMAL HIGH (ref 70–99)
Glucose, Bld: 190 mg/dL — ABNORMAL HIGH (ref 70–99)
Glucose, Bld: 211 mg/dL — ABNORMAL HIGH (ref 70–99)
Glucose, Bld: 201 mg/dL — ABNORMAL HIGH (ref 70–99)
Potassium: 3.2 mmol/L — ABNORMAL LOW (ref 3.5–5.1)
Potassium: 3.6 mmol/L (ref 3.5–5.1)
Potassium: 3.1 mmol/L — ABNORMAL LOW (ref 3.5–5.1)
Potassium: 2.9 mmol/L — ABNORMAL LOW (ref 3.5–5.1)
Sodium: 138 mmol/L (ref 135–145)
Sodium: 139 mmol/L (ref 135–145)
Sodium: 137 mmol/L (ref 135–145)
Sodium: 136 mmol/L (ref 135–145)

## 2019-01-03 LAB — CBC
HCT: 30.2 % — ABNORMAL LOW (ref 36.0–46.0)
Hemoglobin: 10 g/dL — ABNORMAL LOW (ref 12.0–15.0)
MCH: 30.5 pg (ref 26.0–34.0)
MCHC: 33.1 g/dL (ref 30.0–36.0)
MCV: 92.1 fL (ref 80.0–100.0)
Platelets: 233 10*3/uL (ref 150–400)
RBC: 3.28 MIL/uL — ABNORMAL LOW (ref 3.87–5.11)
RDW: 13.4 % (ref 11.5–15.5)
WBC: 12.5 10*3/uL — ABNORMAL HIGH (ref 4.0–10.5)
nRBC: 0 % (ref 0.0–0.2)

## 2019-01-03 LAB — C DIFFICILE QUICK SCREEN W PCR REFLEX
C Diff antigen: POSITIVE — AB
C Diff toxin: NEGATIVE

## 2019-01-03 LAB — MAGNESIUM: Magnesium: 1.7 mg/dL (ref 1.7–2.4)

## 2019-01-03 LAB — POTASSIUM: Potassium: 3.5 mmol/L (ref 3.5–5.1)

## 2019-01-03 LAB — LIPASE, BLOOD: Lipase: 35 U/L (ref 11–51)

## 2019-01-03 MED ORDER — INSULIN ASPART 100 UNIT/ML ~~LOC~~ SOLN
0.0000 [IU] | Freq: Every day | SUBCUTANEOUS | Status: DC
  Administered 2019-01-03: 2 [IU] via SUBCUTANEOUS
  Administered 2019-01-04: 3 [IU] via SUBCUTANEOUS
  Filled 2019-01-03 (×2): qty 1

## 2019-01-03 MED ORDER — POTASSIUM CHLORIDE CRYS ER 20 MEQ PO TBCR
40.0000 meq | EXTENDED_RELEASE_TABLET | Freq: Two times a day (BID) | ORAL | Status: AC
  Administered 2019-01-03 – 2019-01-04 (×3): 40 meq via ORAL
  Filled 2019-01-03 (×3): qty 2

## 2019-01-03 MED ORDER — INSULIN ASPART 100 UNIT/ML ~~LOC~~ SOLN
0.0000 [IU] | Freq: Three times a day (TID) | SUBCUTANEOUS | Status: DC
  Administered 2019-01-03: 2 [IU] via SUBCUTANEOUS
  Administered 2019-01-04 (×3): 8 [IU] via SUBCUTANEOUS
  Administered 2019-01-05: 11 [IU] via SUBCUTANEOUS
  Administered 2019-01-05 (×2): 8 [IU] via SUBCUTANEOUS
  Administered 2019-01-06: 4 [IU] via SUBCUTANEOUS
  Filled 2019-01-03 (×7): qty 1

## 2019-01-03 MED ORDER — ENOXAPARIN SODIUM 40 MG/0.4ML ~~LOC~~ SOLN
40.0000 mg | SUBCUTANEOUS | Status: DC
  Administered 2019-01-03 – 2019-01-05 (×3): 40 mg via SUBCUTANEOUS
  Filled 2019-01-03 (×3): qty 0.4

## 2019-01-03 MED ORDER — HYDRALAZINE HCL 20 MG/ML IJ SOLN: 10.0000 mg | Freq: Four times a day (QID) | INTRAMUSCULAR | Status: DC | PRN

## 2019-01-03 MED ORDER — INSULIN GLARGINE 100 UNIT/ML ~~LOC~~ SOLN
20.0000 [IU] | Freq: Every day | SUBCUTANEOUS | Status: DC
  Administered 2019-01-03: 20 [IU] via SUBCUTANEOUS
  Filled 2019-01-03 (×2): qty 0.2

## 2019-01-03 MED ORDER — DEXTROSE IN LACTATED RINGERS 5 % IV SOLN
INTRAVENOUS | Status: DC
  Administered 2019-01-03: 11:00:00 via INTRAVENOUS

## 2019-01-03 MED ORDER — CHLORHEXIDINE GLUCONATE CLOTH 2 % EX PADS
6.0000 | MEDICATED_PAD | Freq: Every day | CUTANEOUS | Status: DC
  Administered 2019-01-03 – 2019-01-05 (×3): 6 via TOPICAL

## 2019-01-03 NOTE — Care Plan (Signed)
Pamela Lane is a 58 year old female with a past medical history notable for diabetes mellitus, Hypertension, and Asthma who presented to [facility] ED on 6/23 with complaints of generalized malaise, nausea, vomiting, diarrhea, and hyperglycemia.  She was recently hospitalized from 12/26/18 to 12/31/18  for DKA, delirium tremens, Acute Pancreatitis, and UTI.  She reports that since she was discharged home she has been unable to obtain her medications because her Medicaid was taken away.  Initial work-up in the ED revealed glucose 400, bicarb 12, anion gap 21, WBC 14.2.  Venous blood gas with pH 7.18 /CO2 34/O2 44/ bicarb 12.7.  Urinalysis is positive for ketones, negative for UTI. SARS-CoV-2 PCR is negative.  She is admitted to stepdown unit for further work-up and treatment of DKA.  She is currently receiving IV fluids and insulin drip, and is hemodynamically stable.  PLAN Continue to follow DKA protocol with IV fluids and IV insulin drip. Follow BMP q4h, and once serum bicarb greater than 20 and anion gap closed, can convert to sliding scale insulin and long-acting insulin.  PCCM is available as needed for any critical care needs.  We will continue to follow while in ICU/stepdown.    Darel Hong, AGACNP-BC [facility] Pager: [phone] Cell: [phone]

## 2019-01-03 NOTE — Evaluation (Signed)
Physical Therapy Evaluation Patient Details Name: Pamela Lane MRN: [id number] DOB: 05-08-1961 Today's Date: 01/03/2019   History of Present Illness  Pt admitted for DKA with complaints of hyperglycemia with SOB symptoms and weakness. Of note, pt recently admitted for similar symptoms earlier this week. Other PMH include DM and HTN. K+ at 2.9, limited eval performed due to patient participation and lab values  Clinical Impression  Pt is a pleasant 58 year old female who was admitted for DKA. Pt performs bed mobility with min assist, however refused further mobility this date, appears agitated that she is in hospital. Pt demonstrates deficits with strength/balance/mobility. Reports falls at home. Would benefit from skilled PT to address above deficits and promote optimal return to PLOF; recommend transition to STR upon discharge from acute hospitalization.     Follow Up Recommendations SNF    Equipment Recommendations  None recommended by PT    Recommendations for Other Services       Precautions / Restrictions Precautions Precautions: Fall Restrictions Weight Bearing Restrictions: No      Mobility  Bed Mobility Overal bed mobility: Needs Assistance Bed Mobility: Rolling Rolling: Min assist         General bed mobility comments: Refused OOB mobility due to fatigue, however did need min assist for rolling to R side in bed for linen management as well as for scooting up towards HOB. Multiple attempts made for further mobility.  Transfers                 General transfer comment: refused  Ambulation/Gait             General Gait Details: refused  Stairs            Wheelchair Mobility    Modified Rankin (Stroke Patients Only)       Balance Overall balance assessment: History of Falls                                           Pertinent Vitals/Pain Pain Assessment: No/denies pain    Home Living Family/patient expects to  be discharged to:: Private residence Living Arrangements: (friend-refused to provide details) Available Help at Discharge: Friend(s) Type of Home: House Home Access: Stairs to enter Entrance Stairs-Rails: None Entrance Stairs-Number of Steps: 4 Home Layout: One level Home Equipment: Walker - 2 wheels Additional Comments: received RW last admission, pt refuses to answer questions about home living, very vague. Info obtained from previous eval earlier this week    Prior Function Level of Independence: Independent         Comments: reports she hasn't been using RW, has had 2 falls since she's been home. Doesn't elaborate further     Hand Dominance        Extremity/Trunk Assessment   Upper Extremity Assessment Upper Extremity Assessment: Generalized weakness(B UE grossly 3+/5)    Lower Extremity Assessment Lower Extremity Assessment: Generalized weakness(B LE grossly 4+/5)       Communication   Communication: No difficulties  Cognition Arousal/Alertness: Awake/alert Behavior During Therapy: Agitated Overall Cognitive Status: Within Functional Limits for tasks assessed                                 General Comments: annoyed with all the lines/leads present in CCU      General Comments  Exercises Other Exercises Other Exercises: refused ther-ex   Assessment/Plan    PT Assessment Patient needs continued PT services  PT Problem List Decreased strength;Decreased mobility;Decreased safety awareness;Decreased coordination;Decreased activity tolerance;Decreased balance;Decreased knowledge of use of DME       PT Treatment Interventions DME instruction;Therapeutic activities;Gait training;Therapeutic exercise;Patient/family education;Stair training;Balance training;Functional mobility training;Neuromuscular re-education;Manual techniques    PT Goals (Current goals can be found in the Care Plan section)  Acute Rehab PT Goals Patient Stated Goal: go  home PT Goal Formulation: With patient Time For Goal Achievement: 01/17/19 Potential to Achieve Goals: Fair    Frequency Min 2X/week   Barriers to discharge Decreased caregiver support      Co-evaluation               AM-PAC PT "6 Clicks" Mobility  Outcome Measure Help needed turning from your back to your side while in a flat bed without using bedrails?: A Little Help needed moving from lying on your back to sitting on the side of a flat bed without using bedrails?: A Little Help needed moving to and from a bed to a chair (including a wheelchair)?: A Little Help needed standing up from a chair using your arms (e.g., wheelchair or bedside chair)?: A Little Help needed to walk in hospital room?: A Lot   6 Click Score: 14    End of Session   Activity Tolerance: Patient limited by fatigue Patient left: in bed;with call bell/phone within reach;with bed alarm set Nurse Communication: Mobility status PT Visit Diagnosis: Unsteadiness on feet (R26.81);Muscle weakness (generalized) (M62.81);Difficulty in walking, not elsewhere classified (R26.2);Dizziness and giddiness (R42);Other abnormalities of gait and mobility (R26.89)    Time: [id number] PT Time Calculation (min) (ACUTE ONLY): 10 min   Charges:   PT Evaluation $PT Eval Low Complexity: 1 Low          Elizabeth PalauStephanie Tonnia Bardin, PT, DPT [phone]   [name] 01/03/2019, 2:01 PM

## 2019-01-03 NOTE — Care Plan (Signed)
Ketoacidosis has resolved.  I have placed orders for transition from insulin infusion to Lantus plus SSI.  After she is transitioned off of the insulin infusion, she may be transferred to [place] floor.  After transfer to [place] floor, PCCM service will sign off.  Please call if we can be of further assistance.  Merton Border, MD PCCM service Mobile [phone] Pager [phone] 01/03/2019 2:33 PM

## 2019-01-03 NOTE — Progress Notes (Signed)
[place] [facility] [place] NAME: Pamela Lane    MR#:  [id number]  DATE OF BIRTH:  06-03-1961  SUBJECTIVE:   Patient states she is doing well today.  She remains on the insulin drip this morning.  Plan for likely transition to subcutaneous insulin today.  She has no concerns.  REVIEW OF SYSTEMS:  Review of Systems  Constitutional: Negative for chills and fever.  HENT: Negative for congestion and sore throat.   Eyes: Negative for blurred vision and double vision.  Respiratory: Negative for cough and shortness of breath.   Cardiovascular: Negative for chest pain and palpitations.  Gastrointestinal: Negative for nausea and vomiting.  Genitourinary: Negative for dysuria and urgency.  Musculoskeletal: Negative for back pain and neck pain.  Neurological: Negative for dizziness and headaches.  Psychiatric/Behavioral: Negative for depression. The patient is not nervous/anxious.     DRUG ALLERGIES:  No Known Allergies VITALS:  Blood pressure (!) 142/77, pulse (!) 102, temperature 98.4 F (36.9 C), temperature source Oral, resp. rate 20, height 5\' 3"  (1.6 m), weight 92.6 kg, SpO2 97 %. PHYSICAL EXAMINATION:  Physical Exam  GENERAL:   Sitting up in bed with no acute distress.  HEENT: Head atraumatic, normocephalic. Pupils equal, round, reactive to light and accommodation. No scleral icterus. Extraocular muscles intact. Oropharynx and nasopharynx clear.  NECK:  Supple, no jugular venous distention. No thyroid enlargement. LUNGS: Lungs are clear to auscultation bilaterally. No wheezes, crackles, rhonchi. No use of accessory muscles of respiration.  CARDIOVASCULAR: S1, S2 normal. No murmurs, rubs, or gallops.  ABDOMEN: Soft, nontender, nondistended. Bowel sounds present.  EXTREMITIES: No pedal edema, cyanosis, or clubbing.  NEUROLOGIC: CN 2-12 intact, no focal deficits. 5/5 muscle strength throughout all extremities. Sensation intact throughout. Gait  not checked.  PSYCHIATRIC: The patient is alert and oriented x 3.  SKIN: No obvious rash, lesion, or ulcer.  LABORATORY PANEL:  Female CBC Recent Labs  Lab 01/03/19 0455  WBC 12.5*  HGB 10.0*  HCT 30.2*  PLT 233   ------------------------------------------------------------------------------------------------------------------ Chemistries  Recent Labs  Lab 12/28/18 0059  12/30/18 0520  01/03/19 1050  NA 146*   < > 144   < > 136  K 3.5   < > 3.0*   < > 2.9*  CL 116*   < > 110   < > 109  CO2 17*   < > 24   < > 18*  GLUCOSE 156*   < > 234*   < > 201*  BUN 29*   < > 18   < > <5*  CREATININE 1.45*   < > 0.90   < > 0.53  CALCIUM 8.0*   < > 7.7*   < > 8.0*  MG  --    < > 1.9  --   --   AST 20  --   --   --   --   ALT 11  --   --   --   --   ALKPHOS 63  --   --   --   --   BILITOT 0.7  --   --   --   --    < > = values in this interval not displayed.   RADIOLOGY:  No results found. ASSESSMENT AND PLAN:   1.  Diabetic ketoacidosis secondary to noncompliance with insulin.    She remains on insulin drip this morning.  Plan to transition to subcutaneous insulin later today. 2.  Nausea vomiting abdominal pain and diarrhea.  Resolved. 3.  Chronic asthma.  Respiratory status stable PRN inhalers. 4.  Hypertension- continue cardizem and lisinopril.  All the records are reviewed and case discussed with Care Management/Social Worker. Management plans discussed with the patient, family and they are in agreement.  CODE STATUS: Full Code  TOTAL TIME TAKING CARE OF THIS PATIENT: 40 minutes.   More than 50% of the time was spent in counseling/coordination of care: YES  POSSIBLE D/C IN 1-2 DAYS, DEPENDING ON CLINICAL CONDITION.   Pamela Lane M.D on 01/03/2019 at 4:16 PM  Between 7am to 6pm - Pager - [phone]  After 6pm go to [email or web address] - Scientist, research (life sciences)password [email or web address] [email or web address]  [facility]  Office  [phone]  CC: Primary care physician; Center, Pamela Lane  [facility]  Note: This dictation was prepared with Nurse, children'sDragon dictation along with smaller phrase technology. Any transcriptional errors that result from this process are unintentional.

## 2019-01-03 NOTE — Progress Notes (Addendum)
Inpatient Diabetes Program Recommendations  AACE/ADA: New Consensus Statement on Inpatient Glycemic Control   Target Ranges:  Prepandial:   less than 140 mg/dL      Peak postprandial:   less than 180 mg/dL (1-2 hours)      Critically ill patients:  140 - 180 mg/dL   Results for Pamela Lane, Pamela Lane (MRN [id number]) as of 01/03/2019 07:54  Ref. Range 01/03/2019 00:35 01/03/2019 01:35 01/03/2019 02:33 01/03/2019 03:35 01/03/2019 04:35 01/03/2019 05:33 01/03/2019 06:50 01/03/2019 07:22  Glucose-Capillary Latest Ref Range: 70 - 99 mg/dL 172 (H) 126 (H) 122 (H) 119 (H) 163 (H) 191 (H) 213 (H) 221 (H)  Results for Pamela Lane, Pamela Lane (MRN [id number]) as of 01/03/2019 07:54  Ref. Range 02/17/2018 04:40 12/26/2018 12:30  Hemoglobin A1C Latest Ref Range: 4.8 - 5.6 % 14.6 (H) 14.2 (H)   Review of Glycemic Control  Diabetes history: DM2 Outpatient Diabetes medications: Lantus 14 units QAM, Metformin 500 mg BID, Glipizide XL 5 mg daily (pt reports she ran out of Lantus and Metformin and has never taken Glipizide) Current orders for Inpatient glycemic control: IV insulin  Inpatient Diabetes Program Recommendations:   Insulin SQ: Once MD is ready to transition patient from IV to SQ insulin, please consider ordering Lantus 23 units Q24H (based on 92.6 kg x 0.25 units), CBGs Q4H, Novolog 0-9 units Q4H, and when diet resumed please order Novolog 4 units TID with meals for meal coverage if patient eats at least 50% of meals.  NOTE: Per Care Everywhere, patient was discharged from [facility] on August 17, 2018 and was instructed to stop taking Metformin and start taking Glipizide XL 5 mg Daily in the AM and Lantus 14 units QAM for DM control. Patient was admitted at [facility] on 12/26/18 and discharged on 12/31/18 then readmitted on 01/02/19. Per H&P on 01/02/19, patient reported her Medicaid was taken away and she does not have any medications. However, per CM note on 01/02/19, patient does have active Medicaid. Will  plan to follow up with patient today regarding DM control.  Addendum 01/03/19_0 :45-Spoke with patient about diabetes and home regimen for diabetes control. Patient lying in bed with eyes closed during conversation and when asked questions, she was very short and used a loud tone with answers to questions.  Patient states that she does have Medicaid (as noted by CM, Medicaid is active) and that she has not taken any DM meds lately because she is out of medications. Patient reports that she was taking Lantus and Metformin before she ran out. Inquired about Glipizide and patient states she has never heard of that and she was only taking Metformin pills for DM before she ran out. Patient will need prescriptions for DM medications prescribed at time of discharge. Patient states that she uses Lantus insulin pens and she will also need insulin pen needles.  Patient has not been checking glucose lately and she reports that she does not have a glucometer or testing supplies at home.  Discussed A1C results (14.2% on 12/26/18 ) and explained that current A1C indicates an average glucose of 361 mg/dl over the past 2-3 months. Discussed glucose and A1C goals. Discussed importance of checking CBGs and maintaining good CBG control to prevent long-term and short-term complications. Explained how hyperglycemia leads to damage within blood vessels which lead to the common complications seen with uncontrolled diabetes. Stressed to the patient the importance of improving glycemic control to prevent further complications from uncontrolled diabetes. Reviewed with patient that her medications  should cost her $4 with Medicaid.  Encouraged patient get DM medications filled, to take DM medications as prescribed, to check glucose 3-4 times per day, and to follow up with [facility] regarding DM management.   Patient verbalized understanding of information discussed and reports no further questions at this time related to diabetes. At  time of discharge please provide Rx for: glucose monitoring kit (#[id number]), Lantus SoloStar insulin pens (#[id number]), insulin pen needles (#[id number]), and any other DM medications prescribed at discharge.  Thanks, Barnie Alderman, RN, MSN, CDE Diabetes Coordinator Inpatient Diabetes Program [phone] (Team Pager from 8am to 5pm)

## 2019-01-04 LAB — GLUCOSE, CAPILLARY
Glucose-Capillary: 286 mg/dL — ABNORMAL HIGH (ref 70–99)
Glucose-Capillary: 268 mg/dL — ABNORMAL HIGH (ref 70–99)
Glucose-Capillary: 270 mg/dL — ABNORMAL HIGH (ref 70–99)
Glucose-Capillary: 274 mg/dL — ABNORMAL HIGH (ref 70–99)

## 2019-01-04 LAB — BASIC METABOLIC PANEL
Anion gap: 11 (ref 5–15)
BUN: <5 mg/dL — ABNORMAL LOW (ref 6–20)
CO2: 16 mmol/L — ABNORMAL LOW (ref 22–32)
Calcium: 8 mg/dL — ABNORMAL LOW (ref 8.9–10.3)
Chloride: 110 mmol/L (ref 98–111)
Creatinine, Ser: 0.6 mg/dL (ref 0.44–1.00)
GFR calc Af Amer: >60 mL/min (ref >60)
GFR calc non Af Amer: >60 mL/min (ref >60)
Glucose, Bld: 297 mg/dL — ABNORMAL HIGH (ref 70–99)
Potassium: 3.6 mmol/L (ref 3.5–5.1)
Sodium: 137 mmol/L (ref 135–145)

## 2019-01-04 LAB — CLOSTRIDIUM DIFFICILE BY PCR, REFLEXED: Toxigenic C. Difficile by PCR: POSITIVE — AB

## 2019-01-04 MED ORDER — VANCOMYCIN 50 MG/ML ORAL SOLUTION
125.0000 mg | Freq: Four times a day (QID) | ORAL | Status: DC
  Administered 2019-01-04 – 2019-01-05 (×9): 125 mg via ORAL
  Filled 2019-01-04 (×14): qty 2.5

## 2019-01-04 MED ORDER — GABAPENTIN 100 MG PO CAPS
100.0000 mg | ORAL_CAPSULE | Freq: Two times a day (BID) | ORAL | Status: DC
  Administered 2019-01-04 – 2019-01-05 (×3): 100 mg via ORAL
  Filled 2019-01-04 (×3): qty 1

## 2019-01-04 MED ORDER — DILTIAZEM HCL ER COATED BEADS 120 MG PO CP24
120.0000 mg | ORAL_CAPSULE | Freq: Two times a day (BID) | ORAL | Status: DC
  Administered 2019-01-04 – 2019-01-06 (×4): 120 mg via ORAL
  Filled 2019-01-04 (×5): qty 1

## 2019-01-04 MED ORDER — INSULIN GLARGINE 100 UNIT/ML ~~LOC~~ SOLN
25.0000 [IU] | Freq: Every day | SUBCUTANEOUS | Status: DC
  Administered 2019-01-04: 25 [IU] via SUBCUTANEOUS
  Filled 2019-01-04 (×2): qty 0.25

## 2019-01-04 NOTE — Progress Notes (Signed)
Patient ID: Pamela Lane Tritschler, female   DOB: 10/19/1960, 58 y.o.   MRN: [id number]  [employer] PROGRESS NOTE  Pamela Lane Craun WUJ:[id number] DOB: 11/03/1960 DOA: 01/02/2019 [facility]CP: [facility]  HPI/Subjective: Patient not feeling well.  Having a lot of diarrhea.  Quite a few episodes yesterday and all morning this morning.  Abdominal pain better than when she came in.  Patient complains of numbness on her left leg and in her hands.  Objective: Vitals:   01/04/19 0500 01/04/19 0700  BP:    Pulse: (!) 134 99  Resp: (!) 23 16  Temp: 98 F (36.7 C)   SpO2: 100% 99%    Intake/Output Summary (Last 24 hours) at 01/04/2019 1211 Last data filed at 01/03/2019 1614 Gross per 24 hour  Intake 556 ml  Output -  Net 556 ml   Filed Weights   01/02/19 1115 01/02/19 1640  Weight: 113.4 kg 92.6 kg    ROS: Review of Systems  Constitutional: Negative for chills and fever.  Eyes: Negative for blurred vision.  Respiratory: Negative for cough and shortness of breath.   Cardiovascular: Negative for chest pain.  Gastrointestinal: Positive for abdominal pain and diarrhea. Negative for constipation, nausea and vomiting.  Genitourinary: Negative for dysuria.  Musculoskeletal: Negative for joint pain.  Neurological: Negative for dizziness and headaches.   Exam: Physical Exam  Constitutional: She is oriented to person, place, and time.  HENT:  Nose: No mucosal edema.  Mouth/Throat: No oropharyngeal exudate or posterior oropharyngeal edema.  Eyes: Pupils are equal, round, and reactive to light. Conjunctivae, EOM and lids are normal.  Neck: No JVD present. Carotid bruit is not present. No edema present. No thyroid mass and no thyromegaly present.  Cardiovascular: S1 normal and S2 normal. Tachycardia present. Exam reveals no gallop.  No murmur heard. Pulses:      Dorsalis pedis pulses are 2+ on the right side and 2+ on the left side.  Respiratory: No respiratory  distress. She has decreased breath sounds in the right lower field and the left lower field. She has no wheezes. She has no rhonchi. She has no rales.  GI: Soft. Bowel sounds are normal. There is no abdominal tenderness.  Musculoskeletal:     Right ankle: She exhibits no swelling.     Left ankle: She exhibits no swelling.  Lymphadenopathy:    She has no cervical adenopathy.  Neurological: She is alert and oriented to person, place, and time. No cranial nerve deficit.  Skin: Skin is warm. No rash noted. Nails show no clubbing.  Psychiatric: She has a normal mood and affect.      Data Reviewed: Basic Metabolic Panel: Recent Labs  Lab 12/28/18 1216 12/29/18 0440 12/30/18 0520  01/03/19 0105 01/03/19 0455 01/03/19 0905 01/03/19 1050 01/03/19 2124 01/04/19 0339  NA 148* 149* 144   < > 138 139 137 136  --  137  K 3.1* 3.8 3.0*   < > 3.2* 3.6 3.1* 2.9* 3.5 3.6  CL 113* 115* 110   < > 112* 113* 109 109  --  110  CO2 25 23 24    < > 14* 14* 17* 18*  --  16*  GLUCOSE 157* 265* 234*   < > 179* 190* 211* 201*  --  297*  BUN 22* 28* 18   < > 6 5* <5* <5*  --  <5*  CREATININE 1.29* 1.28* 0.90   < > 0.65 0.63 0.66 0.53  --  0.60  CALCIUM 7.6* 7.7* 7.7*   < > 7.8* 7.9* 8.3* 8.0*  --  8.0*  MG 2.2 2.4 1.9  --   --   --   --   --  1.7  --   PHOS  --  2.9 2.7  --   --   --   --   --   --   --    < > = values in this interval not displayed.    Recent Labs  Lab 01/02/19 1657 01/03/19 0455  LIPASE 53* 35   CBC: Recent Labs  Lab 12/29/18 0440 12/30/18 0520 12/31/18 0808 01/02/19 1134 01/03/19 0455  WBC 16.4* 15.2* 13.3* 14.2* 12.5*  NEUTROABS 14.2*  --   --   --   --   HGB 11.7* 11.4* 11.3* 12.9 10.0*  HCT 33.2* 32.5* 33.3* 41.0 30.2*  MCV 87.1 87.6 90.7 96.0 92.1  PLT 168 185 189 264 233    CBG: Recent Labs  Lab 01/03/19 1431 01/03/19 1601 01/03/19 1944 01/03/19 2131 01/04/19 0737  GLUCAP 122* 149* 177* 248* 286*    Recent Results (from the past 240 hour(s))  Novel  Coronavirus,NAA,(SEND-OUT TO REF LAB - TAT 24-48 hrs); Hosp Order     Status: None   Collection Time: 12/26/18  3:26 AM   Specimen: Nasopharyngeal Swab; Respiratory  Result Value Ref Range Status   SARS-CoV-2, NAA NOT DETECTED NOT DETECTED Final    Comment: (NOTE) This test was developed and its performance characteristics determined by [employer]. This test has not been FDA cleared or approved. This test has been authorized by FDA under an Emergency Use Authorization (EUA). This test is only authorized for the duration of time the declaration that circumstances exist justifying the authorization of the emergency use of in vitro diagnostic tests for detection of SARS-CoV-2 virus and/or diagnosis of COVID-19 infection under section 564(b)(1) of the Act, 21 U.S.C. 914NWG-9(F)(6), unless the authorization is terminated or revoked sooner. When diagnostic testing is negative, the possibility of a false negative result should be considered in the context of a patient's recent exposures and the presence of clinical signs and symptoms consistent with COVID-19. An individual without symptoms of COVID-19 and who is not shedding SARS-CoV-2 virus would expect to have a negative (not detected) result in this assay. Performed  At: [facility] [address] [place], [place] [id number] Jolene Schimke MD IO:[id number]    Coronavirus Source NASOPHARYNGEAL  Final    Comment: Performed at [facility], [address] [place], [place] [address]  MRSA PCR Screening     Status: None   Collection Time: 12/26/18  4:12 AM   Specimen: Nasopharyngeal  Result Value Ref Range Status   MRSA by PCR NEGATIVE NEGATIVE Final    Comment:        The GeneXpert MRSA Assay (FDA approved for NASAL specimens only), is one component of a comprehensive MRSA colonization surveillance program. It is not intended to diagnose MRSA infection nor to guide or monitor treatment for MRSA  infections. Performed at [facility], [address] [place], [place] [address]   Culture, blood (routine x 2)     Status: None   Collection Time: 12/26/18  6:27 AM   Specimen: BLOOD  Result Value Ref Range Status   Specimen Description BLOOD RIGHT ANTECUBITAL  Final   Special Requests   Final    BOTTLES DRAWN AEROBIC AND ANAEROBIC Blood Culture adequate volume   Culture   Final  NO GROWTH 5 DAYS Performed at [facility], [address] [place], [place] [address]    Report Status 12/31/2018 FINAL  Final  Culture, blood (routine x 2)     Status: None   Collection Time: 12/26/18  6:38 AM   Specimen: BLOOD  Result Value Ref Range Status   Specimen Description BLOOD BLOOD RIGHT FOREARM  Final   Special Requests   Final    BOTTLES DRAWN AEROBIC AND ANAEROBIC Blood Culture adequate volume   Culture   Final    NO GROWTH 5 DAYS Performed at [facility], [address] [place], [place] [address]    Report Status 12/31/2018 FINAL  Final  Urine Culture     Status: Abnormal   Collection Time: 12/27/18 12:15 PM   Specimen: Urine, Catheterized  Result Value Ref Range Status   Specimen Description   Final    URINE, CATHETERIZED Performed at [facility], [address], [place], [place] [address]    Special Requests   Final    NONE Performed at [facility], [address] [place], [place] [address]    Culture >=100,000 COLONIES/mL PROTEUS MIRABILIS (A)  Final   Report Status 12/29/2018 FINAL  Final   Organism ID, Bacteria PROTEUS MIRABILIS (A)  Final      Susceptibility   Proteus mirabilis - MIC*    AMPICILLIN <=2 SENSITIVE Sensitive     CEFAZOLIN <=4 SENSITIVE Sensitive     CEFTRIAXONE <=1 SENSITIVE Sensitive     CIPROFLOXACIN <=0.25 SENSITIVE Sensitive     GENTAMICIN <=1 SENSITIVE Sensitive     IMIPENEM 4 SENSITIVE Sensitive     NITROFURANTOIN 128 RESISTANT Resistant     TRIMETH/SULFA <=20 SENSITIVE Sensitive      AMPICILLIN/SULBACTAM <=2 SENSITIVE Sensitive     PIP/TAZO <=4 SENSITIVE Sensitive     * >=100,000 COLONIES/mL PROTEUS MIRABILIS  Culture, respiratory (non-expectorated)     Status: None   Collection Time: 12/28/18 12:16 PM   Specimen: Tracheal Aspirate; Respiratory  Result Value Ref Range Status   Specimen Description   Final    TRACHEAL ASPIRATE Performed at [facility], [address] [place], [place] [address]    Special Requests   Final    NONE Performed at [facility], [address] [place], [place] [address]    Gram Stain   Final    MODERATE WBC PRESENT,BOTH PMN AND MONONUCLEAR NO ORGANISMS SEEN    Culture   Final    RARE Consistent with normal respiratory flora. Performed at [facility], [place] [address], [place], [place] [address]    Report Status 12/30/2018 FINAL  Final  SARS Coronavirus 2 [facility] order, Performed in [place] hospital lab)     Status: None   Collection Time: 01/02/19  3:26 PM   Specimen: Nasopharyngeal Swab  Result Value Ref Range Status   SARS Coronavirus 2 NEGATIVE NEGATIVE Final    Comment: (NOTE) If result is NEGATIVE SARS-CoV-2 target nucleic acids are NOT DETECTED. The SARS-CoV-2 RNA is generally detectable in upper and lower  respiratory specimens during the acute phase of infection. The lowest  concentration of SARS-CoV-2 viral copies this assay can detect is 250  copies / mL. A negative result does not preclude SARS-CoV-2 infection  and should not be used as the sole basis for treatment or other  patient management decisions.  A negative result may occur with  improper specimen collection / handling, submission of specimen other  than nasopharyngeal swab, presence of viral mutation(s) within the  areas targeted by this assay, and inadequate number of viral copies  (<250 copies / mL). A negative result must be combined with clinical  observations, patient history, and epidemiological information. If result  is POSITIVE SARS-CoV-2 target nucleic acids are DETECTED. The SARS-CoV-2 RNA is generally detectable in upper and lower  respiratory specimens dur ing the acute phase of infection.  Positive  results are indicative of active infection with SARS-CoV-2.  Clinical  correlation with patient history and other diagnostic information is  necessary to determine patient infection status.  Positive results do  not rule out bacterial infection or co-infection with other viruses. If result is PRESUMPTIVE POSTIVE SARS-CoV-2 nucleic acids MAY BE PRESENT.   A presumptive positive result was obtained on the submitted specimen  and confirmed on repeat testing.  While 2019 novel coronavirus  (SARS-CoV-2) nucleic acids may be present in the submitted sample  additional confirmatory testing may be necessary for epidemiological  and / or clinical management purposes  to differentiate between  SARS-CoV-2 and other Sarbecovirus currently known to infect humans.  If clinically indicated additional testing with an alternate test  methodology [phone]) is advised. The SARS-CoV-2 RNA is generally  detectable in upper and lower respiratory sp ecimens during the acute  phase of infection. The expected result is Negative. Fact Sheet for Patients:  BoilerBrush.com.cyhttps://www.fda.gov/media/136312/download Fact Sheet for Healthcare Providers: https://pope.com/https://www.fda.gov/media/136313/download This test is not yet approved or cleared by the [place] FDA and has been authorized for detection and/or diagnosis of SARS-CoV-2 by FDA under an Emergency Use Authorization (EUA).  This EUA will remain in effect (meaning this test can be used) for the duration of the COVID-19 declaration under Section 564(b)(1) of the Act, 21 U.S.C. section 360bbb-3(b)(1), unless the authorization is terminated or revoked sooner. Performed at [facility], [address] [place], [place] [address]   C difficile quick scan w PCR reflex     Status:  Abnormal   Collection Time: 01/03/19 10:53 PM   Specimen: STOOL  Result Value Ref Range Status   C Diff antigen POSITIVE (A) NEGATIVE Final   C Diff toxin NEGATIVE NEGATIVE Final   C Diff interpretation Results are indeterminate. See PCR results.  Final    Comment: Performed at [facility], [address] [place], [place] [address]  C. Diff by PCR, Reflexed     Status: Abnormal   Collection Time: 01/03/19 10:53 PM  Result Value Ref Range Status   Toxigenic C. Difficile by PCR POSITIVE (A) NEGATIVE Final    Comment: Positive for toxigenic C. difficile with little to no toxin production. Only treat if clinical presentation suggests symptomatic illness. Performed at [facility], [address] [place], [place] [address]       Scheduled Meds: . aspirin EC  81 mg Oral Daily  . Chlorhexidine Gluconate Cloth  6 each Topical Daily  . diltiazem  120 mg Oral Daily  . enoxaparin (LOVENOX) injection  40 mg Subcutaneous Q24H  . folic acid  1 mg Oral Daily  . insulin aspart  0-15 Units Subcutaneous TID WC  . insulin aspart  0-5 Units Subcutaneous QHS  . insulin glargine  25 Units Subcutaneous Daily  . lisinopril  10 mg Oral Daily  . multivitamin with minerals  1 tablet Oral Daily  . QUEtiapine  200 mg Oral QHS  . thiamine  100 mg Oral Daily  . vancomycin  125 mg Oral QID  Continuous Infusions:  Assessment/Plan:  1. Diabetic ketoacidosis on presentation secondary to noncompliance with medications.  Increase glargine insulin to 25 units daily.  Continue sliding scale. 2. C. difficile colitis.  Testing was ordered on admission so this is not a hospital-acquired infection.  This was present prior to coming into the hospital.  Oral vancomycin.  Diarrhea will need to settle down prior to disposition. 3. Diabetic neuropathy trial of low-dose gabapentin 4. Essential hypertension and tachycardia.  Increase Cardizem CD and continue lisinopril 5. Continue Seroquel  Code  Status:     Code Status Orders  (From admission, onward)         Start     Ordered   01/02/19 1512  Full code  Continuous     01/02/19 1512        Code Status History    Date Active Date Inactive Code Status Order ID Comments User Context   12/26/2018 0356 12/30/2018 1055 Full Code [id number]  Arnaldo Nataliamond, Michael S, MD Inpatient   03/04/2018 2203 03/07/2018 1426 Full Code [id number]  Shaune Pollackhen, Qing, MD Inpatient   02/16/2018 1330 02/18/2018 1412 Full Code [id number]  Bertrum SolSalary, Montell D, MD Inpatient   11/26/2017 1421 11/27/2017 1812 Full Code [id number]  Auburn BilberryPatel, Shreyang, MD Inpatient   10/16/2017 0212 10/17/2017 1427 Full Code [id number]  Enedina FinnerPatel, Sona, MD Inpatient   09/23/2017 2252 09/24/2017 1505 Full Code [id number]  Cammy CopaMaier, Angela, MD Inpatient   05/15/2016 1745 05/16/2016 1749 Full Code [id number]  Auburn BilberryPatel, Shreyang, MD Inpatient   01/13/2016 0051 01/14/2016 1314 Full Code [id number]  Clydie BraunSmith, Rondell A, MD ED   01/08/2016 1916 01/09/2016 1704 Full Code [id number]  Katharina CaperVaickute, Rima, MD Inpatient   Advance Care Planning Activity     Family Communication: Refused Disposition Plan: Diarrhea will need to settle down prior to disposition  Antibiotics:  Oral vancomycin  Time spent: 27 minutes  Addalyne Vandehei [employer]  [employer]

## 2019-01-04 NOTE — Progress Notes (Signed)
Physical Therapy Treatment Patient Details Name: Pamela Lane MRN: [id number] DOB: 12/24/1960 Today's Date: 01/04/2019    History of Present Illness Pt admitted for DKA with complaints of hyperglycemia with SOB symptoms and weakness. Of note, pt recently admitted for similar symptoms earlier this week. Other PMH include DM and HTN. K+ at 2.9, limited eval performed due to patient participation and lab values    PT Comments    Pt generally agitated today and refusing there ex, but did participate in a toilet transfer to assess movement.  Pt able to perform bed mobility mod I and transfer with supervision.  Pt maintained a flexed posture and unilateral UE support for balance.  Pt able to sit at EOB without UE assistance.  She requested to return to bed, stating that she felt "dizzy" earlier in the day and that she still does not feel well.  Pt will continue to benefit from skilled PT with focus on safe functional mobility, strength and tolerance to activity.  Pt appropriate for SNF placement following discharge due to fall risk and mobility status.  Follow Up Recommendations  SNF     Equipment Recommendations  None recommended by PT    Recommendations for Other Services       Precautions / Restrictions Precautions Precautions: Fall Restrictions Weight Bearing Restrictions: No    Mobility  Bed Mobility Overal bed mobility: Independent                Transfers Overall transfer level: Needs assistance   Transfers: Stand Pivot Transfers Sit to Stand: Supervision Stand pivot transfers: Supervision       General transfer comment: Pt able to stand and transfer to toilet without assistance from PT.  Pt appeared lethargic but able to manage on her own.  Ambulation/Gait                 Stairs             Wheelchair Mobility    Modified Rankin (Stroke Patients Only)       Balance Overall balance assessment: History of Falls;Modified  Independent Sitting-balance support: Single extremity supported Sitting balance-Leahy Scale: Good     Standing balance support: Single extremity supported Standing balance-Leahy Scale: Fair                              Cognition Arousal/Alertness: Awake/alert Behavior During Therapy: Restless Overall Cognitive Status: Within Functional Limits for tasks assessed                                 General Comments: Willing to work with therapy at first and then stated "I'm not doing all this" following initiation of exercises.      Exercises Other Exercises Other Exercises: Refused ther ex    General Comments General comments (skin integrity, edema, etc.): Pt able to balance holding onto BSC arm rest, etc, remains in a flexed posture.      Pertinent Vitals/Pain      Home Living                      Prior Function            PT Goals (current goals can now be found in the care plan section) Progress towards PT goals: Progressing toward goals(Pt more independent with functional mobility.)    Frequency  Min 2X/week      PT Plan Discharge plan needs to be updated    Co-evaluation              AM-PAC PT "6 Clicks" Mobility   Outcome Measure  Help needed turning from your back to your side while in a flat bed without using bedrails?: A Little Help needed moving from lying on your back to sitting on the side of a flat bed without using bedrails?: A Little Help needed moving to and from a bed to a chair (including a wheelchair)?: A Little Help needed standing up from a chair using your arms (e.g., wheelchair or bedside chair)?: A Little Help needed to walk in hospital room?: A Lot Help needed climbing 3-5 steps with a railing? : A Lot 6 Click Score: 16    End of Session   Activity Tolerance: Treatment limited secondary to agitation Patient left: in bed;with call bell/phone within reach;with bed alarm set   PT Visit  Diagnosis: Unsteadiness on feet (R26.81);Muscle weakness (generalized) (M62.81);Difficulty in walking, not elsewhere classified (R26.2);Dizziness and giddiness (R42);Other abnormalities of gait and mobility (R26.89)     Time: 1358-1410 PT Time Calculation (min) (ACUTE ONLY): 12 min  Charges:  $Therapeutic Activity: 8-22 mins                     Roxanne Gates, PT, DPT    Roxanne Gates 01/04/2019, 3:10 PM

## 2019-01-04 NOTE — Progress Notes (Signed)
Inpatient Diabetes Program Recommendations  AACE/ADA: New Consensus Statement on Inpatient Glycemic Control  Target Ranges:  Prepandial:   less than 140 mg/dL      Peak postprandial:   less than 180 mg/dL (1-2 hours)      Critically ill patients:  140 - 180 mg/dL  Results for Pamela Lane, Pamela Lane (MRN [id number]) as of 01/04/2019 07:07  Ref. Range 01/04/2019 03:39  Glucose Latest Ref Range: 70 - 99 mg/dL 297 (H)   Results for Pamela Lane, Pamela Lane (MRN [id number]) as of 01/04/2019 07:07  Ref. Range 01/03/2019 14:31 01/03/2019 16:01 01/03/2019 19:44 01/03/2019 21:31  Glucose-Capillary Latest Ref Range: 70 - 99 mg/dL 122 (H) 149 (H) 177 (H) 248 (H)   Review of Glycemic Control  Diabetes history: DM2 Outpatient Diabetes medications: Lantus 14 units QAM, Metformin 500 mg BID, Glipizide XL 5 mg daily (pt reports she ran out of Lantus and Metformin and has never taken Glipizide Current orders for Inpatient glycemic control: Lantus 20 units daily, Novolog 0-15 units TID with meals, Novolog 0-5 units QHS  Inpatient Diabetes Program Recommendations:   Insulin - Basal: Please consider increasing Lantus to 25 units daily.  Insulin-Meal Coverage: Please consider ordering Novolog 5 units TID with meals for meal coverage if patient eats at least 50% of meals.  Thanks,  Barnie Alderman, RN, MSN, CDE Diabetes Coordinator Inpatient Diabetes Program [phone] (Team Pager from 8am to 5pm)

## 2019-01-04 NOTE — Progress Notes (Signed)
Pt had several loose stools last night , C diff + , placed on enteric precautions and this was explained to patient. Began po antibiotics this am

## 2019-01-04 NOTE — Progress Notes (Signed)
Patient is floor care status.  PCCM has not seen officially today.  We are available as needed.  Merton Border, MD PCCM service Mobile [phone] Pager [phone] 01/04/2019 11:52 AM

## 2019-01-05 LAB — GLUCOSE, CAPILLARY
Glucose-Capillary: 273 mg/dL — ABNORMAL HIGH (ref 70–99)
Glucose-Capillary: 252 mg/dL — ABNORMAL HIGH (ref 70–99)
Glucose-Capillary: 319 mg/dL — ABNORMAL HIGH (ref 70–99)
Glucose-Capillary: 153 mg/dL — ABNORMAL HIGH (ref 70–99)

## 2019-01-05 LAB — CBC
HCT: 32.2 % — ABNORMAL LOW (ref 36.0–46.0)
Hemoglobin: 10.8 g/dL — ABNORMAL LOW (ref 12.0–15.0)
MCH: 30 pg (ref 26.0–34.0)
MCHC: 33.5 g/dL (ref 30.0–36.0)
MCV: 89.4 fL (ref 80.0–100.0)
Platelets: 282 10*3/uL (ref 150–400)
RBC: 3.6 MIL/uL — ABNORMAL LOW (ref 3.87–5.11)
RDW: 12.7 % (ref 11.5–15.5)
WBC: 8.3 10*3/uL (ref 4.0–10.5)
nRBC: 0 % (ref 0.0–0.2)

## 2019-01-05 LAB — BASIC METABOLIC PANEL
Anion gap: 12 (ref 5–15)
BUN: <5 mg/dL — ABNORMAL LOW (ref 6–20)
CO2: 18 mmol/L — ABNORMAL LOW (ref 22–32)
Calcium: 8.4 mg/dL — ABNORMAL LOW (ref 8.9–10.3)
Chloride: 106 mmol/L (ref 98–111)
Creatinine, Ser: 0.62 mg/dL (ref 0.44–1.00)
GFR calc Af Amer: >60 mL/min (ref >60)
GFR calc non Af Amer: >60 mL/min (ref >60)
Glucose, Bld: 258 mg/dL — ABNORMAL HIGH (ref 70–99)
Potassium: 3.2 mmol/L — ABNORMAL LOW (ref 3.5–5.1)
Sodium: 136 mmol/L (ref 135–145)

## 2019-01-05 LAB — MAGNESIUM: Magnesium: 1.8 mg/dL (ref 1.7–2.4)

## 2019-01-05 MED ORDER — POTASSIUM CHLORIDE CRYS ER 20 MEQ PO TBCR: 40.0000 meq | EXTENDED_RELEASE_TABLET | ORAL | Status: DC

## 2019-01-05 MED ORDER — INSULIN GLARGINE 100 UNIT/ML ~~LOC~~ SOLN
30.0000 [IU] | Freq: Every day | SUBCUTANEOUS | Status: DC
  Administered 2019-01-05: 14:00:00 30 [IU] via SUBCUTANEOUS
  Filled 2019-01-05 (×2): qty 0.3

## 2019-01-05 MED ORDER — POTASSIUM CHLORIDE CRYS ER 20 MEQ PO TBCR
40.0000 meq | EXTENDED_RELEASE_TABLET | Freq: Once | ORAL | Status: AC
  Administered 2019-01-05: 06:00:00 40 meq via ORAL
  Filled 2019-01-05: qty 2

## 2019-01-05 MED ORDER — INSULIN ASPART 100 UNIT/ML ~~LOC~~ SOLN
5.0000 [IU] | Freq: Three times a day (TID) | SUBCUTANEOUS | Status: DC
  Administered 2019-01-05 – 2019-01-06 (×4): 5 [IU] via SUBCUTANEOUS
  Filled 2019-01-05 (×4): qty 1

## 2019-01-05 MED ORDER — POTASSIUM CHLORIDE CRYS ER 20 MEQ PO TBCR
40.0000 meq | EXTENDED_RELEASE_TABLET | Freq: Once | ORAL | Status: AC
  Administered 2019-01-05: 10:00:00 40 meq via ORAL
  Filled 2019-01-05: qty 2

## 2019-01-05 MED ORDER — QUETIAPINE FUMARATE 100 MG PO TABS
100.0000 mg | ORAL_TABLET | Freq: Every day | ORAL | Status: DC
  Administered 2019-01-05: 100 mg via ORAL
  Filled 2019-01-05: qty 1

## 2019-01-05 NOTE — Care Plan (Signed)
Patient is floor care status.  PCCM has not seen officially today.  We are available as needed.   C. Laura Brees Hounshell, MD [place] PCCM 

## 2019-01-05 NOTE — Progress Notes (Signed)
Patient ID: Pamela Lane, female   DOB: 10/08/1960, 58 y.o.   MRN: [id number]   [employer] PROGRESS NOTE  Pamela Lane NAT:[id number] DOB: 03/29/1961 DOA: 01/02/2019 [facility]  HPI/Subjective: Patient okay.  Still having some diarrhea with a couple episodes overnight and 1 this morning.  Patient complains of a dull headache.  Patient states that she is still numb in the right hand and left leg..  Objective: Vitals:   01/04/19 2000 01/04/19 2143  BP: (!) 124/98 (!) 144/85  Pulse:    Resp:    Temp: 98 F (36.7 C)   SpO2: 100%    No intake or output data in the 24 hours ending 01/05/19 1034 Filed Weights   01/02/19 1115 01/02/19 1640  Weight: 113.4 kg 92.6 kg    ROS: Review of Systems  Constitutional: Negative for chills and fever.  Eyes: Negative for blurred vision.  Respiratory: Negative for cough and shortness of breath.   Cardiovascular: Negative for chest pain.  Gastrointestinal: Positive for abdominal pain and diarrhea. Negative for constipation, nausea and vomiting.  Genitourinary: Negative for dysuria.  Musculoskeletal: Negative for joint pain.  Neurological: Positive for tingling and sensory change. Negative for dizziness and headaches.   Exam: Physical Exam  Constitutional: She is oriented to person, place, and time.  HENT:  Nose: No mucosal edema.  Mouth/Throat: No oropharyngeal exudate or posterior oropharyngeal edema.  Eyes: Pupils are equal, round, and reactive to light. Conjunctivae, EOM and lids are normal.  Neck: No JVD present. Carotid bruit is not present. No edema present. No thyroid mass and no thyromegaly present.  Cardiovascular: S1 normal and S2 normal. Exam reveals no gallop.  No murmur heard. Pulses:      Dorsalis pedis pulses are 2+ on the right side and 2+ on the left side.  Respiratory: No respiratory distress. She has decreased breath sounds in the right lower field and the left lower field. She  has no wheezes. She has no rhonchi. She has no rales.  GI: Soft. Bowel sounds are normal. There is no abdominal tenderness.  Musculoskeletal:     Right ankle: She exhibits no swelling.     Left ankle: She exhibits no swelling.  Lymphadenopathy:    She has no cervical adenopathy.  Neurological: She is alert and oriented to person, place, and time. No cranial nerve deficit.  Skin: Skin is warm. No rash noted. Nails show no clubbing.  Psychiatric: She has a normal mood and affect.      Data Reviewed: Basic Metabolic Panel: Recent Labs  Lab 12/30/18 0520  01/03/19 0455 01/03/19 0905 01/03/19 1050 01/03/19 2124 01/04/19 0339 01/05/19 0404  NA 144   < > 139 137 136  --  137 136  K 3.0*   < > 3.6 3.1* 2.9* 3.5 3.6 3.2*  CL 110   < > 113* 109 109  --  110 106  CO2 24   < > 14* 17* 18*  --  16* 18*  GLUCOSE 234*   < > 190* 211* 201*  --  297* 258*  BUN 18   < > 5* <5* <5*  --  <5* <5*  CREATININE 0.90   < > 0.63 0.66 0.53  --  0.60 0.62  CALCIUM 7.7*   < > 7.9* 8.3* 8.0*  --  8.0* 8.4*  MG 1.9  --   --   --   --  1.7  --  1.8  PHOS 2.7  --   --   --   --   --   --   --    < > =  values in this interval not displayed.    Recent Labs  Lab 01/02/19 1657 01/03/19 0455  LIPASE 53* 35   CBC: Recent Labs  Lab 12/30/18 0520 12/31/18 0808 01/02/19 1134 01/03/19 0455 01/05/19 0404  WBC 15.2* 13.3* 14.2* 12.5* 8.3  HGB 11.4* 11.3* 12.9 10.0* 10.8*  HCT 32.5* 33.3* 41.0 30.2* 32.2*  MCV 87.6 90.7 96.0 92.1 89.4  PLT 185 189 264 233 282    CBG: Recent Labs  Lab 01/04/19 0737 01/04/19 1131 01/04/19 1627 01/04/19 2259 01/05/19 0804  GLUCAP 286* 268* 270* 274* 273*    Recent Results (from the past 240 hour(s))  Urine Culture     Status: Abnormal   Collection Time: 12/27/18 12:15 PM   Specimen: Urine, Catheterized  Result Value Ref Range Status   Specimen Description   Final    URINE, CATHETERIZED Performed at [facility], [address] [place],  [place] [address]    Special Requests   Final    NONE Performed at [facility], [address] [place], [place] [address]    Culture >=100,000 COLONIES/mL PROTEUS MIRABILIS (A)  Final   Report Status 12/29/2018 FINAL  Final   Organism ID, Bacteria PROTEUS MIRABILIS (A)  Final      Susceptibility   Proteus mirabilis - MIC*    AMPICILLIN <=2 SENSITIVE Sensitive     CEFAZOLIN <=4 SENSITIVE Sensitive     CEFTRIAXONE <=1 SENSITIVE Sensitive     CIPROFLOXACIN <=0.25 SENSITIVE Sensitive     GENTAMICIN <=1 SENSITIVE Sensitive     IMIPENEM 4 SENSITIVE Sensitive     NITROFURANTOIN 128 RESISTANT Resistant     TRIMETH/SULFA <=20 SENSITIVE Sensitive     AMPICILLIN/SULBACTAM <=2 SENSITIVE Sensitive     PIP/TAZO <=4 SENSITIVE Sensitive     * >=100,000 COLONIES/mL PROTEUS MIRABILIS  Culture, respiratory (non-expectorated)     Status: None   Collection Time: 12/28/18 12:16 PM   Specimen: Tracheal Aspirate; Respiratory  Result Value Ref Range Status   Specimen Description   Final    TRACHEAL ASPIRATE Performed at [facility], [address] [place], [place] [address]    Special Requests   Final    NONE Performed at [facility], [address] [place], [place] [address]    Gram Stain   Final    MODERATE WBC PRESENT,BOTH PMN AND MONONUCLEAR NO ORGANISMS SEEN    Culture   Final    RARE Consistent with normal respiratory flora. Performed at [facility], [address] [place], [place] [address]    Report Status 12/30/2018 FINAL  Final  SARS Coronavirus 2 [facility](Hospital order, Performed in [facility] hospital lab)     Status: None   Collection Time: 01/02/19  3:26 PM   Specimen: Nasopharyngeal Swab  Result Value Ref Range Status   SARS Coronavirus 2 NEGATIVE NEGATIVE Final    Comment: (NOTE) If result is NEGATIVE SARS-CoV-2 target nucleic acids are NOT DETECTED. The SARS-CoV-2 RNA is generally detectable in upper and lower  respiratory specimens during the  acute phase of infection. The lowest  concentration of SARS-CoV-2 viral copies this assay can detect is 250  copies / mL. A negative result does not preclude SARS-CoV-2 infection  and should not be used as the sole basis for treatment or other  patient management decisions.  A negative result may occur with  improper specimen collection / handling, submission of specimen other  than nasopharyngeal swab, presence of viral mutation(s) within the  areas targeted by this assay, and inadequate number of viral copies  (<250 copies / mL). A negative result must be combined with clinical  observations, patient history, and epidemiological information. If result is POSITIVE SARS-CoV-2 target nucleic acids are DETECTED. The SARS-CoV-2 RNA is generally detectable in upper and lower  respiratory specimens dur ing the acute phase of infection.  Positive  results are indicative of active infection with SARS-CoV-2.  Clinical  correlation with patient history and other diagnostic information is  necessary to determine patient infection status.  Positive results do  not rule out bacterial infection or co-infection with other viruses. If result is PRESUMPTIVE POSTIVE SARS-CoV-2 nucleic acids MAY BE PRESENT.   A presumptive positive result was obtained on the submitted specimen  and confirmed on repeat testing.  While 2019 novel coronavirus  (SARS-CoV-2) nucleic acids may be present in the submitted sample  additional confirmatory testing may be necessary for epidemiological  and / or clinical management purposes  to differentiate between  SARS-CoV-2 and other Sarbecovirus currently known to infect humans.  If clinically indicated additional testing with an alternate test  methodology [phone]) is advised. The SARS-CoV-2 RNA is generally  detectable in upper and lower respiratory sp ecimens during the acute  phase of infection. The expected result is Negative. Fact Sheet for Patients:   StrictlyIdeas.no Fact Sheet for Healthcare Providers: BankingDealers.co.za This test is not yet approved or cleared by the [place] FDA and has been authorized for detection and/or diagnosis of SARS-CoV-2 by FDA under an Emergency Use Authorization (EUA).  This EUA will remain in effect (meaning this test can be used) for the duration of the COVID-19 declaration under Section 564(b)(1) of the Act, 21 U.S.C. section 360bbb-3(b)(1), unless the authorization is terminated or revoked sooner. Performed at [facility], [address] [place], [place] [address]   C difficile quick scan w PCR reflex     Status: Abnormal   Collection Time: 01/03/19 10:53 PM   Specimen: STOOL  Result Value Ref Range Status   C Diff antigen POSITIVE (A) NEGATIVE Final   C Diff toxin NEGATIVE NEGATIVE Final   C Diff interpretation Results are indeterminate. See PCR results.  Final    Comment: Performed at [facility], [address] [place], [place] [address]  C. Diff by PCR, Reflexed     Status: Abnormal   Collection Time: 01/03/19 10:53 PM  Result Value Ref Range Status   Toxigenic C. Difficile by PCR POSITIVE (A) NEGATIVE Final    Comment: Positive for toxigenic C. difficile with little to no toxin production. Only treat if clinical presentation suggests symptomatic illness. Performed at [facility], [address] [place], [place] [address]       Scheduled Meds: . aspirin EC  81 mg Oral Daily  . Chlorhexidine Gluconate Cloth  6 each Topical Daily  . diltiazem  120 mg Oral BID  . enoxaparin (LOVENOX) injection  40 mg Subcutaneous Q24H  . folic acid  1 mg Oral Daily  . gabapentin  100 mg Oral BID  . insulin aspart  0-15 Units Subcutaneous TID WC  . insulin aspart  0-5 Units Subcutaneous QHS  . insulin aspart  5 Units Subcutaneous TID WC  . insulin glargine  25 Units Subcutaneous Daily  . lisinopril  10 mg Oral Daily   . multivitamin with minerals  1 tablet Oral Daily  . QUEtiapine  200 mg Oral QHS  . thiamine  100 mg Oral Daily  .  vancomycin  125 mg Oral QID   Continuous Infusions:  Assessment/Plan:  1. C. difficile colitis.  Testing was ordered on admission so this is not a hospital-acquired infection.  Continue oral vancomycin.  Patient will need a full course of vancomycin 10 to 14 days. 2. Diabetic ketoacidosis on presentation secondary to noncompliance with medications.  Increase Lantus insulin to 30 units daily.  On 5 units prior to meals. 3. Diabetic neuropathy trial of low-dose gabapentin 4. Essential hypertension and tachycardia.  Heart rate better on increased dose of Cardizem CD.  Continue lisinopril. 5. Continue Seroquel 6. Headache.  Patient states this is been going on for a little while.  She thinks it may be 1 of the medications.  We will try to lower the dose of the Seroquel.  Code Status:     Code Status Orders  (From admission, onward)         Start     Ordered   01/02/19 1512  Full code  Continuous     01/02/19 1512        Code Status History    Date Active Date Inactive Code Status Order ID Comments User Context   12/26/2018 0356 12/30/2018 1055 Full Code [id number]  Arnaldo Nataliamond, Michael S, MD Inpatient   03/04/2018 2203 03/07/2018 1426 Full Code [id number]  Shaune Pollackhen, Qing, MD Inpatient   02/16/2018 1330 02/18/2018 1412 Full Code [id number]  Bertrum SolSalary, Montell D, MD Inpatient   11/26/2017 1421 11/27/2017 1812 Full Code [id number]  Auburn BilberryPatel, Shreyang, MD Inpatient   10/16/2017 0212 10/17/2017 1427 Full Code [id number]  Enedina FinnerPatel, Sona, MD Inpatient   09/23/2017 2252 09/24/2017 1505 Full Code [id number]  Cammy CopaMaier, Angela, MD Inpatient   05/15/2016 1745 05/16/2016 1749 Full Code [id number]  Auburn BilberryPatel, Shreyang, MD Inpatient   01/13/2016 0051 01/14/2016 1314 Full Code [id number]  Clydie BraunSmith, Rondell A, MD ED   01/08/2016 1916 01/09/2016 1704 Full Code [id number]  Katharina CaperVaickute, Rima, MD Inpatient   Advance Care Planning Activity      Family Communication: Refused Disposition Plan: Hopefully home over the weekend  Antibiotics:  Oral vancomycin  Time spent: 27 minutes  Heith Haigler [employer]  [employer]

## 2019-01-05 NOTE — TOC Progression Note (Signed)
Transition of Care [facility]) - Progression Note    Patient Details  Name: Pamela Lane MRN: [id number] Date of Birth: 1960-10-05  Transition of [facility]) CM/SW Contact  Shela Leff, [place] Phone Number: 01/05/2019, 12:10 PM  Clinical Narrative:   CSW informed by Dr. Leslye Peer that patient will more than likely discharge home tomorrow. Patient will require oral vancomycin in the capsule form. CSW spoke with patient this morning regarding obtaining her medication. Patient states that she normally goes to [facility] but states she also goes to [facility] on [address]. CSW contacted the [facility] and spoke with Ebony Hail who checked and stated that they do have the oral vanc and that the copay for medicaid would be $3. CSW has informed patient of this. Patient states she has a ride and can get her medications.     Expected Discharge Plan: [place] Barriers to Discharge: No Barriers Identified  Expected Discharge Plan and Services Expected Discharge Plan: [place]   Discharge Planning Services: Medication Assistance                                           Social Determinants of Health (SDOH) Interventions    Readmission Risk Interventions No flowsheet data found.

## 2019-01-05 NOTE — Progress Notes (Signed)
Inpatient Diabetes Program Recommendations  AACE/ADA: New Consensus Statement on Inpatient Glycemic Control   Target Ranges:  Prepandial:   less than 140 mg/dL      Peak postprandial:   less than 180 mg/dL (1-2 hours)      Critically ill patients:  140 - 180 mg/dL   Results for LODA, BIALAS (MRN [id number]) as of 01/05/2019 07:44  Ref. Range 01/04/2019 07:37 01/04/2019 11:31 01/04/2019 16:27 01/04/2019 22:59  Glucose-Capillary Latest Ref Range: 70 - 99 mg/dL 286 (H) 268 (H) 270 (H) 274 (H)   Review of Glycemic Control  Diabetes history: DM2 Outpatient Diabetes medications: Lantus 14 units QAM,Metformin500 mg BID,Glipizide XL 5 mg daily(pt reports she ran out of Lantus and Metformin and has never taken Glipizide Current orders for Inpatient glycemic control: Lantus 25 units daily, Novolog 5 units TID with meals for meal coverage, Novolog 0-15 units TID with meals, Novolog 0-5 units QHS   Inpatient Diabetes Program Recommendations:   Insulin - Basal: Please consider increasing Lantus to 30 units daily.  Insulin - Meal Coverage: Noted Novolog 5 units TID with meals ordered to start this morning.  Thanks, Barnie Alderman, RN, MSN, CDE Diabetes Coordinator Inpatient Diabetes Program [phone] (Team Pager from 8am to 5pm)

## 2019-01-06 LAB — GLUCOSE, CAPILLARY: Glucose-Capillary: 309 mg/dL — ABNORMAL HIGH (ref 70–99)

## 2019-01-06 MED ORDER — METFORMIN HCL 500 MG PO TABS
500.0000 mg | ORAL_TABLET | Freq: Two times a day (BID) | ORAL | Status: DC
  Filled 2019-01-06 (×2): qty 1

## 2019-01-06 MED ORDER — GABAPENTIN 100 MG PO CAPS: 100.0000 mg | ORAL_CAPSULE | Freq: Two times a day (BID) | ORAL | 0 refills | Status: DC

## 2019-01-06 MED ORDER — DILTIAZEM HCL ER COATED BEADS 120 MG PO CP24: 120.0000 mg | ORAL_CAPSULE | Freq: Two times a day (BID) | ORAL | 0 refills | Status: DC

## 2019-01-06 MED ORDER — GLIPIZIDE ER 5 MG PO TB24: 5.0000 mg | ORAL_TABLET | Freq: Every day | ORAL | 1 refills | Status: DC

## 2019-01-06 MED ORDER — QUETIAPINE FUMARATE 100 MG PO TABS: 100.0000 mg | ORAL_TABLET | Freq: Every day | ORAL | 0 refills | Status: DC

## 2019-01-06 MED ORDER — VANCOMYCIN HCL 125 MG PO CAPS: 125.0000 mg | ORAL_CAPSULE | Freq: Four times a day (QID) | ORAL | 0 refills | Status: AC

## 2019-01-06 MED ORDER — POTASSIUM CHLORIDE CRYS ER 20 MEQ PO TBCR: 40.0000 meq | EXTENDED_RELEASE_TABLET | Freq: Once | ORAL | Status: DC

## 2019-01-06 MED ORDER — INSULIN GLARGINE 100 UNIT/ML SOLOSTAR PEN: 30.0000 [IU] | PEN_INJECTOR | Freq: Every day | SUBCUTANEOUS | 1 refills | Status: DC

## 2019-01-06 MED ORDER — METFORMIN HCL 500 MG PO TABS: 500.0000 mg | ORAL_TABLET | Freq: Two times a day (BID) | ORAL | 1 refills | Status: DC

## 2019-01-06 MED ORDER — INSULIN PEN NEEDLE 32G X 5 MM MISC: 30.0000 [IU] | Freq: Every day | 0 refills | Status: DC

## 2019-01-06 MED ORDER — BLOOD GLUCOSE MONITOR KIT: PACK | 0 refills | Status: DC

## 2019-01-06 MED ORDER — INSULIN ASPART 100 UNIT/ML ~~LOC~~ SOLN: 5.0000 [IU] | Freq: Three times a day (TID) | SUBCUTANEOUS | 0 refills | Status: DC

## 2019-01-06 NOTE — Progress Notes (Signed)
Patient is agitated and keeps saying her ride is here and she will walk out.  RN removed 2 of her 3 IVs to prepare for her discharge.  MD was notified that the patient is ready.  Phillis Knack, RN

## 2019-01-06 NOTE — Discharge Summary (Addendum)
[facility] [place] NAME: Pamela Lane    MR#:  [id number]  DATE OF BIRTH:  [birth year]  DATE OF ADMISSION:  01/02/2019 ADMITTING PHYSICIAN: Loletha Grayer, MD  DATE OF DISCHARGE:  01/06/19   PRIMARY CARE PHYSICIAN: [facility] [place]    ADMISSION DIAGNOSIS:  Diabetic ketoacidosis without coma associated with type 2 diabetes mellitus ([place]) [E11.10]  DISCHARGE DIAGNOSIS:  Active Problems:   DKA (diabetic ketoacidoses) (HCC) C. difficile colitis   SECONDARY DIAGNOSIS:   Past Medical History:  Diagnosis Date  . Asthma   . Diabetes mellitus without complication ([place])   . Hypertension   . Mental disorder    pt reports 'I have all of them mental disorders'    HOSPITAL COURSE:  HPI  Pamela Lane  is a 58 y.o. female with a known history of diabetes presents with sugars being high and not feeling well. Patient was recently in the hospital for diabetic ketoacidosis.  She states that her Medicaid was taken away and she does not have any medications.  Patient states that she has been sick as a dog.  She states she has had nausea vomiting diarrhea and abdominal pain.  Her sugars have been high.  She is just not feeling well.  Hospitalist services were contacted for further evaluation for diabetic ketoacidosis  1. C. difficile colitis.  Testing was ordered on admission so this is not a hospital-acquired infection.  Continue oral vancomycin.  Patient will need a full course of vancomycin 10 to 14 days.  Will provide prescription 2. Diabetic ketoacidosis on presentation secondary to noncompliance with medications.  Increase Lantus insulin to 30 units daily.  On 5 units prior to meals.  Metformin her home medication resumed outpatient follow-up with endocrinology will be beneficial.  PCP to refer the patient to endocrinologist 3. Diabetic neuropathy trial of low-dose gabapentin 4. Essential hypertension and tachycardia.   Heart rate better on increased dose of Cardizem CD.  Continue lisinopril. 5. Continue Seroquel 6. Headache.  Patient states this is been going on for a little while.  She thinks it may be 1 of the medications.  We will  lower the dose of the Seroquel.   DISCHARGE CONDITIONS:   [facility].  PCP to refer the patient to endocrinology   CONSULTS OBTAINED:     PROCEDURES  DRUG ALLERGIES:  No Known Allergies  DISCHARGE MEDICATIONS:   Allergies as of 01/06/2019   No Known Allergies     Medication List    STOP taking these medications   cefUROXime 250 MG tablet Commonly known as: Ceftin   Lantus 100 UNIT/ML injection Generic drug: insulin glargine Replaced by: Insulin Glargine 100 UNIT/ML Solostar Pen     TAKE these medications   albuterol 108 (90 Base) MCG/ACT inhaler Commonly known as: VENTOLIN HFA Inhale 2 puffs into the lungs every 6 (six) hours as needed for wheezing or shortness of breath.   aspirin EC 81 MG tablet Take 81 mg by mouth daily.   blood glucose meter kit and supplies Kit Dispense based on patient and insurance preference. Use up to four times daily as directed. (FOR ICD-9 250.00, 250.01).   buPROPion 150 MG 12 hr tablet Commonly known as: WELLBUTRIN SR Take 150 mg by mouth 2 (two) times daily.   diltiazem 120 MG 24 hr capsule Commonly known as: CARDIZEM CD Take 1 capsule (120 mg total)  by mouth 2 (two) times a day. What changed: when to take this   folic acid 1 MG tablet Commonly known as: FOLVITE Take 1 tablet (1 mg total) by mouth daily.   gabapentin 100 MG capsule Commonly known as: NEURONTIN Take 1 capsule (100 mg total) by mouth 2 (two) times daily.   glipiZIDE 5 MG 24 hr tablet Commonly known as: GLUCOTROL XL Take 1 tablet (5 mg total) by mouth daily with breakfast.   insulin aspart 100 UNIT/ML injection Commonly known as: novoLOG Inject 5 Units into the skin 3 (three)  times daily with meals.   Insulin Glargine 100 UNIT/ML Solostar Pen Commonly known as: LANTUS Inject 30 Units into the skin daily. Replaces: Lantus 100 UNIT/ML injection   Insulin Pen Needle 32G X 5 MM Misc 30 Units by Does not apply route daily.   ipratropium-albuterol 0.5-2.5 (3) MG/3ML Soln Commonly known as: DUONEB Take 3 mLs by nebulization every 6 (six) hours as needed for wheezing, shortness of breath or cough.   lisinopril 10 MG tablet Commonly known as: Prinivil Take 1 tablet (10 mg total) by mouth daily.   metFORMIN 500 MG tablet Commonly known as: GLUCOPHAGE Take 1 tablet (500 mg total) by mouth 2 (two) times daily with a meal.   multivitamin with minerals Tabs tablet Take 1 tablet by mouth daily.   QUEtiapine 25 MG tablet Commonly known as: SEROQUEL Take 25 mg by mouth every 4 (four) hours as needed. What changed: Another medication with the same name was changed. Make sure you understand how and when to take each.   QUEtiapine 100 MG tablet Commonly known as: SEROQUEL Take 1 tablet (100 mg total) by mouth at bedtime. What changed:   medication strength  how much to take   senna-docusate 8.6-50 MG tablet Commonly known as: Senokot-S Take 1 tablet by mouth at bedtime as needed for mild constipation.   thiamine 100 MG tablet Take 1 tablet (100 mg total) by mouth daily.   vancomycin 125 MG capsule Commonly known as: Vancocin HCl Take 1 capsule (125 mg total) by mouth 4 (four) times daily for 10 days.        DISCHARGE INSTRUCTIONS:   Follow-up with primary care physician in 2 to 3 days  DIET:  Cardiac diet and Diabetic diet  DISCHARGE CONDITION:  Fair  ACTIVITY:  Activity as tolerated  OXYGEN:  Home Oxygen: No.   Oxygen Delivery: room air  DISCHARGE LOCATION:  home   If you experience worsening of your admission symptoms, develop shortness of breath, life threatening emergency, suicidal or homicidal thoughts you must seek medical  attention immediately by calling 911 or calling your MD immediately  if symptoms less severe.  You Must read complete instructions/literature along with all the possible adverse reactions/side effects for all the Medicines you take and that have been prescribed to you. Take any new Medicines after you have completely understood and accpet all the possible adverse reactions/side effects.   Please note  You were cared for by a hospitalist during your hospital stay. If you have any questions about your discharge medications or the care you received while you were in the hospital after you are discharged, you can call the unit and asked to speak with the hospitalist on call if the hospitalist that took care of you is not available. Once you are discharged, your primary care physician will handle any further medical issues. Please note that NO REFILLS for any discharge medications will be authorized once  you are discharged, as it is imperative that you return to your primary care physician (or establish a relationship with a primary care physician if you do not have one) for your aftercare needs so that they can reassess your need for medications and monitor your lab values.     Today  Chief Complaint  Patient presents with  . Fatigue  . Blood Sugar Problem   Patient is feeling better and leaving the hospital against medical advise refused to stay.  Left hospital even before prescriptions were printed and discharge paperwork was done.  As reported by RN diarrhea resolved and no bowel movements for more than 24 hours.  Wants to go home immediately, as her ride is waiting outside  ROS:  Patient is in rush  to leave the hospital  VITAL SIGNS:  Blood pressure 114/71, pulse 96, temperature 98.6 F (37 C), temperature source Axillary, resp. rate 20, height _0  (1.6 m), weight 92.6 kg, SpO2 100 %.  I/O:    Intake/Output Summary (Last 24 hours) at 01/06/2019 0940 Last data filed at 01/06/2019  0865 Gross per 24 hour  Intake -  Output 2150 ml  Net -2150 ml    PHYSICAL EXAMINATION:  Patient refused physical exam ,  leaving AMA   gENERAL:  58 y.o.-year-old patient lying in the bed with no acute distress.  HEENT: Head atraumatic, normocephalic. Oropharynx and nasopharynx clear.  NEUROLOGIC: Awake, alert and oriented x3.  PSYCHIATRIC: The patient is alert and oriented x 3.    DATA REVIEW:   CBC Recent Labs  Lab 01/05/19 0404  WBC 8.3  HGB 10.8*  HCT 32.2*  PLT 282    Chemistries  Recent Labs  Lab 01/05/19 0404  NA 136  K 3.2*  CL 106  CO2 18*  GLUCOSE 258*  BUN <5*  CREATININE 0.62  CALCIUM 8.4*  MG 1.8    Cardiac Enzymes No results for input(s): TROPONINI in the last 168 hours.  Microbiology Results  Results for orders placed or performed during the hospital encounter of 01/02/19  SARS Coronavirus 2 [facility] order, Performed in [facility] hospital lab)     Status: None   Collection Time: 01/02/19  3:26 PM   Specimen: Nasopharyngeal Swab  Result Value Ref Range Status   SARS Coronavirus 2 NEGATIVE NEGATIVE Final    Comment: (NOTE) If result is NEGATIVE SARS-CoV-2 target nucleic acids are NOT DETECTED. The SARS-CoV-2 RNA is generally detectable in upper and lower  respiratory specimens during the acute phase of infection. The lowest  concentration of SARS-CoV-2 viral copies this assay can detect is 250  copies / mL. A negative result does not preclude SARS-CoV-2 infection  and should not be used as the sole basis for treatment or other  patient management decisions.  A negative result may occur with  improper specimen collection / handling, submission of specimen other  than nasopharyngeal swab, presence of viral mutation(s) within the  areas targeted by this assay, and inadequate number of viral copies  (<250 copies / mL). A negative result must be combined with clinical  observations, patient history, and epidemiological information. If  result is POSITIVE SARS-CoV-2 target nucleic acids are DETECTED. The SARS-CoV-2 RNA is generally detectable in upper and lower  respiratory specimens dur ing the acute phase of infection.  Positive  results are indicative of active infection with SARS-CoV-2.  Clinical  correlation with patient history and other diagnostic information is  necessary to determine patient infection status.  Positive results  do  not rule out bacterial infection or co-infection with other viruses. If result is PRESUMPTIVE POSTIVE SARS-CoV-2 nucleic acids MAY BE PRESENT.   A presumptive positive result was obtained on the submitted specimen  and confirmed on repeat testing.  While 2019 novel coronavirus  (SARS-CoV-2) nucleic acids may be present in the submitted sample  additional confirmatory testing may be necessary for epidemiological  and / or clinical management purposes  to differentiate between  SARS-CoV-2 and other Sarbecovirus currently known to infect humans.  If clinically indicated additional testing with an alternate test  methodology [phone]) is advised. The SARS-CoV-2 RNA is generally  detectable in upper and lower respiratory sp ecimens during the acute  phase of infection. The expected result is Negative. Fact Sheet for Patients:  StrictlyIdeas.no Fact Sheet for Healthcare Providers: BankingDealers.co.za This test is not yet approved or cleared by the [place] FDA and has been authorized for detection and/or diagnosis of SARS-CoV-2 by FDA under an Emergency Use Authorization (EUA).  This EUA will remain in effect (meaning this test can be used) for the duration of the COVID-19 declaration under Section 564(b)(1) of the Act, 21 U.S.C. section 360bbb-3(b)(1), unless the authorization is terminated or revoked sooner. Performed at [facility], [address] [place], [place] [address]   C difficile quick scan w PCR reflex      Status: Abnormal   Collection Time: 01/03/19 10:53 PM   Specimen: STOOL  Result Value Ref Range Status   C Diff antigen POSITIVE (A) NEGATIVE Final   C Diff toxin NEGATIVE NEGATIVE Final   C Diff interpretation Results are indeterminate. See PCR results.  Final    Comment: Performed at [facility], [address] [place], [place] [address]  C. Diff by PCR, Reflexed     Status: Abnormal   Collection Time: 01/03/19 10:53 PM  Result Value Ref Range Status   Toxigenic C. Difficile by PCR POSITIVE (A) NEGATIVE Final    Comment: Positive for toxigenic C. difficile with little to no toxin production. Only treat if clinical presentation suggests symptomatic illness. Performed at [facility], [address], [place], [place] [address]     RADIOLOGY:  No results found.  EKG:   Orders placed or performed during the hospital encounter of 12/26/18  . EKG 12-Lead  . EKG 12-Lead       CODE STATUS:     Code Status Orders  (From admission, onward)         Start     Ordered   01/02/19 1512  Full code  Continuous     01/02/19 1512        Code Status History    Date Active Date Inactive Code Status Order ID Comments User Context   12/26/2018 0356 12/30/2018 1055 Full Code [id number]  Harrie Foreman, MD Inpatient   03/04/2018 2203 03/07/2018 1426 Full Code [id number]  Demetrios Loll, MD Inpatient   02/16/2018 1330 02/18/2018 1412 Full Code [id number]  Gorden Harms, MD Inpatient   11/26/2017 1421 11/27/2017 1812 Full Code [id number]  Dustin Flock, MD Inpatient   10/16/2017 0212 10/17/2017 1427 Full Code [id number]  Fritzi Mandes, MD Inpatient   09/23/2017 2252 09/24/2017 1505 Full Code [id number]  Amelia Jo, MD Inpatient   05/15/2016 1745 05/16/2016 1749 Full Code [id number]  Dustin Flock, MD Inpatient   01/13/2016 0051 01/14/2016 1314 Full Code [id number]  Norval Morton, MD ED   01/08/2016 1916 01/09/2016 1704 Full Code [id number]  Ether Griffins,  Dorene Sorrow, MD Inpatient   Ad[facility]  Planning Activity      TOTAL TIME TAKING CARE OF THIS PATIENT: 39 minutes.   Note: This dictation was prepared with Dragon dictation along with smaller phrase technology. Any transcriptional errors that result from this process are unintentional.   _0 @  on 01/06/2019 at 9:40 AM  Between 7am to 6pm - Pager - [phone]  After 6pm go to [email or web address] - password [email or web address] [facility]  Office  [phone]  CC: Primary care physician; [facility] [place]

## 2019-01-06 NOTE — Discharge Instructions (Signed)
Follow-up with primary care physician in 2 to 3 days ° °

## 2019-01-06 NOTE — Progress Notes (Signed)
Patient left AMA, she signed the form before leaving.  MD came to the floor just minutes after she left.  Phillis Knack, RN

## 2019-01-07 ENCOUNTER — Encounter (HOSPITAL_COMMUNITY): Payer: Self-pay

## 2019-01-07 ENCOUNTER — Other Ambulatory Visit: Payer: Self-pay

## 2019-01-07 ENCOUNTER — Emergency Department (HOSPITAL_COMMUNITY)
Admission: EM | Admit: 2019-01-07 | Discharge: 2019-01-07 | Disposition: A | Discharge status: Home / Self Care | Payer: Medicaid Other | Attending: Emergency Medicine | Admitting: Emergency Medicine

## 2019-01-07 DIAGNOSIS — R739 Hyperglycemia, unspecified: Secondary | ICD-10-CM

## 2019-01-07 DIAGNOSIS — Z794 Long term (current) use of insulin: Secondary | ICD-10-CM | POA: Diagnosis not present

## 2019-01-07 DIAGNOSIS — Z79899 Other long term (current) drug therapy: Secondary | ICD-10-CM | POA: Diagnosis not present

## 2019-01-07 DIAGNOSIS — F17210 Nicotine dependence, cigarettes, uncomplicated: Secondary | ICD-10-CM | POA: Insufficient documentation

## 2019-01-07 DIAGNOSIS — Z7982 Long term (current) use of aspirin: Secondary | ICD-10-CM | POA: Diagnosis not present

## 2019-01-07 DIAGNOSIS — E1165 Type 2 diabetes mellitus with hyperglycemia: Secondary | ICD-10-CM | POA: Insufficient documentation

## 2019-01-07 DIAGNOSIS — R5383 Other fatigue: Secondary | ICD-10-CM | POA: Diagnosis not present

## 2019-01-07 DIAGNOSIS — I10 Essential (primary) hypertension: Secondary | ICD-10-CM | POA: Insufficient documentation

## 2019-01-07 LAB — URINALYSIS, ROUTINE W REFLEX MICROSCOPIC
Bilirubin Urine: NEGATIVE
Glucose, UA: >=500 mg/dL — AB
Hgb urine dipstick: NEGATIVE
Ketones, ur: 5 mg/dL — AB
Leukocytes,Ua: NEGATIVE
Nitrite: NEGATIVE
Protein, ur: NEGATIVE mg/dL
Specific Gravity, Urine: 1.013 (ref 1.005–1.030)
pH: 6 (ref 5.0–8.0)

## 2019-01-07 LAB — COMPREHENSIVE METABOLIC PANEL
ALT: 16 U/L (ref 0–44)
AST: 15 U/L (ref 15–41)
Albumin: 3.1 g/dL — ABNORMAL LOW (ref 3.5–5.0)
Alkaline Phosphatase: 91 U/L (ref 38–126)
Anion gap: 13 (ref 5–15)
BUN: 11 mg/dL (ref 6–20)
CO2: 21 mmol/L — ABNORMAL LOW (ref 22–32)
Calcium: 9.1 mg/dL (ref 8.9–10.3)
Chloride: 94 mmol/L — ABNORMAL LOW (ref 98–111)
Creatinine, Ser: 1.36 mg/dL — ABNORMAL HIGH (ref 0.44–1.00)
GFR calc Af Amer: 50 mL/min — ABNORMAL LOW (ref >60)
GFR calc non Af Amer: 43 mL/min — ABNORMAL LOW (ref >60)
Glucose, Bld: 458 mg/dL — ABNORMAL HIGH (ref 70–99)
Potassium: 3.7 mmol/L (ref 3.5–5.1)
Sodium: 128 mmol/L — ABNORMAL LOW (ref 135–145)
Total Bilirubin: 0.5 mg/dL (ref 0.3–1.2)
Total Protein: 7.6 g/dL (ref 6.5–8.1)

## 2019-01-07 LAB — CBC WITH DIFFERENTIAL/PLATELET
Abs Immature Granulocytes: 0.14 10*3/uL — ABNORMAL HIGH (ref 0.00–0.07)
Basophils Absolute: 0 10*3/uL (ref 0.0–0.1)
Basophils Relative: 0 %
Eosinophils Absolute: 0 10*3/uL (ref 0.0–0.5)
Eosinophils Relative: 0 %
HCT: 33.8 % — ABNORMAL LOW (ref 36.0–46.0)
Hemoglobin: 11 g/dL — ABNORMAL LOW (ref 12.0–15.0)
Immature Granulocytes: 1 %
Lymphocytes Relative: 14 %
Lymphs Abs: 1.6 10*3/uL (ref 0.7–4.0)
MCH: 30.4 pg (ref 26.0–34.0)
MCHC: 32.5 g/dL (ref 30.0–36.0)
MCV: 93.4 fL (ref 80.0–100.0)
Monocytes Absolute: 0.7 10*3/uL (ref 0.1–1.0)
Monocytes Relative: 6 %
Neutro Abs: 9.2 10*3/uL — ABNORMAL HIGH (ref 1.7–7.7)
Neutrophils Relative %: 79 %
Platelets: 428 10*3/uL — ABNORMAL HIGH (ref 150–400)
RBC: 3.62 MIL/uL — ABNORMAL LOW (ref 3.87–5.11)
RDW: 12.4 % (ref 11.5–15.5)
WBC: 11.7 10*3/uL — ABNORMAL HIGH (ref 4.0–10.5)
nRBC: 0 % (ref 0.0–0.2)

## 2019-01-07 LAB — CBG MONITORING, ED
Glucose-Capillary: 414 mg/dL — ABNORMAL HIGH (ref 70–99)
Glucose-Capillary: 426 mg/dL — ABNORMAL HIGH (ref 70–99)
Glucose-Capillary: 239 mg/dL — ABNORMAL HIGH (ref 70–99)

## 2019-01-07 MED ORDER — INSULIN ASPART 100 UNIT/ML ~~LOC~~ SOLN
12.0000 [IU] | Freq: Once | SUBCUTANEOUS | Status: AC
  Administered 2019-01-07: 12 [IU] via INTRAVENOUS
  Filled 2019-01-07: qty 0.12

## 2019-01-07 MED ORDER — LACTATED RINGERS IV BOLUS
2000.0000 mL | Freq: Once | INTRAVENOUS | Status: AC
  Administered 2019-01-07: 2000 mL via INTRAVENOUS

## 2019-01-07 NOTE — ED Provider Notes (Signed)
[facility] DEPT Provider Note   CSN: [id number] Arrival date & time: 01/07/19  1243     History   Chief Complaint Chief Complaint  Patient presents with  . Hyperglycemia    HPI Pamela Lane is a 58 y.o. female.     HPI   58 year old female with numerous complaints.  Primarily hyperglycemia though.  Recent admissions for the same.  She reports compliance with her medications although prior notes mention consistent noncompliance.  Today she notes that her sugar was running very high.  Polyuria.  Some numbness in her hands.  Past Medical History:  Diagnosis Date  . Asthma   . Diabetes mellitus without complication ([place])   . Hypertension   . Mental disorder    pt reports 'I have all of them mental disorders'    Patient Active Problem List   Diagnosis Date Noted  . Acute pancreatitis 05/15/2016  . AKI (acute kidney injury) ([place])   . Diabetic ketoacidosis without coma associated with type 2 diabetes mellitus ([place])   . Hyperglycemia due to type 2 diabetes mellitus ([place]) 01/13/2016  . UTI (urinary tract infection) 01/13/2016  . DKA (diabetic ketoacidoses) ([place]) 01/08/2016  . Hyponatremia 01/08/2016  . Dehydration 01/08/2016  . Urinary tract infection 01/08/2016  . Upper abdominal pain 01/08/2016  . Nausea & vomiting 01/08/2016    Past Surgical History:  Procedure Laterality Date  . ABDOMINAL HYSTERECTOMY       OB History   No obstetric history on file.      Home Medications    Prior to Admission medications   Medication Sig Start Date End Date Taking? Authorizing Provider  albuterol (PROVENTIL HFA;VENTOLIN HFA) 108 (90 Base) MCG/ACT inhaler Inhale 2 puffs into the lungs every 6 (six) hours as needed for wheezing or shortness of breath. 02/18/18   Dustin Flock, MD  aspirin EC 81 MG tablet Take 81 mg by mouth daily.    [provider]  blood glucose meter kit and supplies KIT Dispense based on patient and insurance  preference. Use up to four times daily as directed. (FOR ICD-9 250.00, 250.01). 01/06/19   Gouru, Illene Silver, MD  buPROPion (WELLBUTRIN SR) 150 MG 12 hr tablet Take 150 mg by mouth 2 (two) times daily.    [provider]  diltiazem (CARDIZEM CD) 120 MG 24 hr capsule Take 1 capsule (120 mg total) by mouth 2 (two) times a day. 01/06/19   Nicholes Mango, MD  folic acid (FOLVITE) 1 MG tablet Take 1 tablet (1 mg total) by mouth daily. 03/08/18   Nicholes Mango, MD  gabapentin (NEURONTIN) 100 MG capsule Take 1 capsule (100 mg total) by mouth 2 (two) times daily. 01/06/19   Gouru, Illene Silver, MD  glipiZIDE (GLUCOTROL XL) 5 MG 24 hr tablet Take 1 tablet (5 mg total) by mouth daily with breakfast. 01/06/19 01/07/20  Gouru, Illene Silver, MD  insulin aspart (NOVOLOG) 100 UNIT/ML injection Inject 5 Units into the skin 3 (three) times daily with meals. 01/06/19   Gouru, Illene Silver, MD  Insulin Glargine (LANTUS) 100 UNIT/ML Solostar Pen Inject 30 Units into the skin daily. 01/06/19   Gouru, Illene Silver, MD  Insulin Pen Needle 32G X 5 MM MISC 30 Units by Does not apply route daily. 01/06/19   Gouru, Illene Silver, MD  ipratropium-albuterol (DUONEB) 0.5-2.5 (3) MG/3ML SOLN Take 3 mLs by nebulization every 6 (six) hours as needed for wheezing, shortness of breath or cough. 02/23/18   [provider]  lisinopril (PRINIVIL) 10 MG  tablet Take 1 tablet (10 mg total) by mouth daily. 02/18/18   Dustin Flock, MD  metFORMIN (GLUCOPHAGE) 500 MG tablet Take 1 tablet (500 mg total) by mouth 2 (two) times daily with a meal. 01/06/19   Gouru, Aruna, MD  Multiple Vitamin (MULTIVITAMIN WITH MINERALS) TABS tablet Take 1 tablet by mouth daily. 03/08/18   Nicholes Mango, MD  QUEtiapine (SEROQUEL) 100 MG tablet Take 1 tablet (100 mg total) by mouth at bedtime. 01/06/19   Gouru, Illene Silver, MD  QUEtiapine (SEROQUEL) 25 MG tablet Take 25 mg by mouth every 4 (four) hours as needed. 03/02/18   [provider]  senna-docusate (SENOKOT-S) 8.6-50 MG tablet Take 1 tablet by  mouth at bedtime as needed for mild constipation. 03/07/18   Nicholes Mango, MD  thiamine 100 MG tablet Take 1 tablet (100 mg total) by mouth daily. 03/08/18   Nicholes Mango, MD  vancomycin (VANCOCIN HCL) 125 MG capsule Take 1 capsule (125 mg total) by mouth 4 (four) times daily for 10 days. 01/06/19 01/16/19  Nicholes Mango, MD    Family History Family History  Problem Relation Age of Onset  . Hypertension Mother     Social History Social History   Tobacco Use  . Smoking status: Current Every Day Smoker    Packs/day: 0.10    Types: Cigarettes  . Smokeless tobacco: Never Used  Substance Use Topics  . Alcohol use: Yes    Alcohol/week: 40.0 standard drinks    Types: 40 Cans of beer per week    Comment: 3 big bottles of liquor  . Drug use: Yes    Types: Marijuana, Cocaine     Allergies   Patient has no known allergies.   Review of Systems Review of Systems  All systems reviewed and negative, other than as noted in HPI.  Physical Exam Updated Vital Signs BP 134/68 (BP Location: Left Arm)   Pulse (!) 110   Temp 99.2 F (37.3 C) (Oral)   Resp 17   SpO2 100%   Physical Exam Vitals signs and nursing note reviewed.  Constitutional:      General: She is not in acute distress.    Appearance: She is well-developed.  HENT:     Head: Normocephalic and atraumatic.  Eyes:     General:        Right eye: No discharge.        Left eye: No discharge.     Conjunctiva/sclera: Conjunctivae normal.  Neck:     Musculoskeletal: Neck supple.  Cardiovascular:     Rate and Rhythm: Normal rate and regular rhythm.     Heart sounds: Normal heart sounds. No murmur. No friction rub. No gallop.   Pulmonary:     Effort: Pulmonary effort is normal. No respiratory distress.     Breath sounds: Normal breath sounds.  Abdominal:     General: There is no distension.     Palpations: Abdomen is soft.     Tenderness: There is no abdominal tenderness.  Musculoskeletal:        General: No tenderness.   Skin:    General: Skin is warm and dry.  Neurological:     Mental Status: She is alert.  Psychiatric:        Behavior: Behavior normal.        Thought Content: Thought content normal.      ED Treatments / Results  Labs (all labs ordered are listed, but only abnormal results are displayed) Labs Reviewed  CBC WITH  DIFFERENTIAL/PLATELET - Abnormal; Notable for the following components:      Result Value   WBC 11.7 (*)    RBC 3.62 (*)    Hemoglobin 11.0 (*)    HCT 33.8 (*)    Platelets 428 (*)    Neutro Abs 9.2 (*)    Abs Immature Granulocytes 0.14 (*)    All other components within normal limits  COMPREHENSIVE METABOLIC PANEL - Abnormal; Notable for the following components:   Sodium 128 (*)    Chloride 94 (*)    CO2 21 (*)    Glucose, Bld 458 (*)    Creatinine, Ser 1.36 (*)    Albumin 3.1 (*)    GFR calc non Af Amer 43 (*)    GFR calc Af Amer 50 (*)    All other components within normal limits  URINALYSIS, ROUTINE W REFLEX MICROSCOPIC - Abnormal; Notable for the following components:   APPearance HAZY (*)    Glucose, UA >=500 (*)    Ketones, ur 5 (*)    Bacteria, UA RARE (*)    All other components within normal limits  CBG MONITORING, ED - Abnormal; Notable for the following components:   Glucose-Capillary 414 (*)    All other components within normal limits  CBG MONITORING, ED - Abnormal; Notable for the following components:   Glucose-Capillary 426 (*)    All other components within normal limits  CBG MONITORING, ED - Abnormal; Notable for the following components:   Glucose-Capillary 239 (*)    All other components within normal limits    EKG    Radiology No results found.  Procedures Procedures (including critical care time)  Medications Ordered in ED Medications  lactated ringers bolus 2,000 mL (0 mLs Intravenous Stopped 01/07/19 1632)  insulin aspart (novoLOG) injection 12 Units (12 Units Intravenous Given 01/07/19 1526)     Initial Impression /  Assessment and Plan / ED Course  I have reviewed the triage vital signs and the nursing notes.  Pertinent labs & imaging results that were available during my care of the patient were reviewed by me and considered in my medical decision making (see chart for details).     58 year old female with hyperglycemia.  Bicarb is trivial low and a tiny amount of ketones noted on her urine.  No increased anion gap.  She was treated with IV fluids and insulin with improvement of her blood sugar.  Advised importance of compliance with all her medications.  Return precautions discussed.  Outpatient follow-up otherwise.  Final Clinical Impressions(s) / ED Diagnoses   Final diagnoses:  Hyperglycemia  Other fatigue    ED Discharge Orders    None       Virgel Manifold, MD 01/07/19 2253

## 2019-01-07 NOTE — ED Triage Notes (Signed)
She states she has "been having trouble controlling my blood sugars". She also tells [place] she has been recently hosptialized. She c/o tells [place] she has had some "numb fingers (bilat.) for months". She is in no distress.

## 2019-01-07 NOTE — ED Notes (Signed)
Pt aware of need for urine specimen. Will try to provide one after receiving some fluids.

## 2019-01-07 NOTE — ED Notes (Signed)
x2 unsuccessful IV attempts. Matt RN will attempt ultrasound.

## 2019-01-17 ENCOUNTER — Other Ambulatory Visit: Payer: Self-pay

## 2019-01-17 ENCOUNTER — Inpatient Hospital Stay
Admission: EM | Admit: 2019-01-17 | Discharge: 2019-01-18 | DRG: 638 | Disposition: A | Discharge status: Home / Self Care | Payer: Medicaid Other | Attending: Internal Medicine | Admitting: Internal Medicine

## 2019-01-17 ENCOUNTER — Encounter: Payer: Self-pay | Admitting: Emergency Medicine

## 2019-01-17 ENCOUNTER — Emergency Department: Payer: Medicaid Other

## 2019-01-17 DIAGNOSIS — F319 Bipolar disorder, unspecified: Secondary | ICD-10-CM | POA: Diagnosis present

## 2019-01-17 DIAGNOSIS — Z9119 Patient's noncompliance with other medical treatment and regimen: Secondary | ICD-10-CM

## 2019-01-17 DIAGNOSIS — Z1159 Encounter for screening for other viral diseases: Secondary | ICD-10-CM

## 2019-01-17 DIAGNOSIS — F17210 Nicotine dependence, cigarettes, uncomplicated: Secondary | ICD-10-CM | POA: Diagnosis present

## 2019-01-17 DIAGNOSIS — Z7982 Long term (current) use of aspirin: Secondary | ICD-10-CM | POA: Diagnosis not present

## 2019-01-17 DIAGNOSIS — Z794 Long term (current) use of insulin: Secondary | ICD-10-CM

## 2019-01-17 DIAGNOSIS — Z79899 Other long term (current) drug therapy: Secondary | ICD-10-CM | POA: Diagnosis not present

## 2019-01-17 DIAGNOSIS — Z90710 Acquired absence of both cervix and uterus: Secondary | ICD-10-CM | POA: Diagnosis not present

## 2019-01-17 DIAGNOSIS — T383X6A Underdosing of insulin and oral hypoglycemic [antidiabetic] drugs, initial encounter: Secondary | ICD-10-CM | POA: Diagnosis present

## 2019-01-17 DIAGNOSIS — I10 Essential (primary) hypertension: Secondary | ICD-10-CM | POA: Diagnosis present

## 2019-01-17 DIAGNOSIS — J45909 Unspecified asthma, uncomplicated: Secondary | ICD-10-CM | POA: Diagnosis present

## 2019-01-17 DIAGNOSIS — E0810 Diabetes mellitus due to underlying condition with ketoacidosis without coma: Secondary | ICD-10-CM

## 2019-01-17 DIAGNOSIS — N179 Acute kidney failure, unspecified: Secondary | ICD-10-CM | POA: Diagnosis present

## 2019-01-17 DIAGNOSIS — E860 Dehydration: Secondary | ICD-10-CM | POA: Diagnosis present

## 2019-01-17 DIAGNOSIS — E1010 Type 1 diabetes mellitus with ketoacidosis without coma: Secondary | ICD-10-CM | POA: Diagnosis present

## 2019-01-17 DIAGNOSIS — F1010 Alcohol abuse, uncomplicated: Secondary | ICD-10-CM | POA: Diagnosis present

## 2019-01-17 DIAGNOSIS — E1110 Type 2 diabetes mellitus with ketoacidosis without coma: Secondary | ICD-10-CM | POA: Diagnosis present

## 2019-01-17 DIAGNOSIS — E1040 Type 1 diabetes mellitus with diabetic neuropathy, unspecified: Secondary | ICD-10-CM | POA: Diagnosis present

## 2019-01-17 DIAGNOSIS — Z91128 Patient's intentional underdosing of medication regimen for other reason: Secondary | ICD-10-CM

## 2019-01-17 DIAGNOSIS — F3130 Bipolar disorder, current episode depressed, mild or moderate severity, unspecified: Secondary | ICD-10-CM | POA: Diagnosis not present

## 2019-01-17 HISTORY — DX: Enterocolitis due to Clostridium difficile, not specified as recurrent: A04.72

## 2019-01-17 LAB — COMPREHENSIVE METABOLIC PANEL
ALT: 11 U/L (ref 0–44)
AST: 10 U/L — ABNORMAL LOW (ref 15–41)
Albumin: 4 g/dL (ref 3.5–5.0)
Alkaline Phosphatase: 118 U/L (ref 38–126)
BUN: 23 mg/dL — ABNORMAL HIGH (ref 6–20)
CO2: <7 mmol/L — ABNORMAL LOW (ref 22–32)
Calcium: 9.4 mg/dL (ref 8.9–10.3)
Chloride: 99 mmol/L (ref 98–111)
Creatinine, Ser: 1.47 mg/dL — ABNORMAL HIGH (ref 0.44–1.00)
GFR calc Af Amer: 45 mL/min — ABNORMAL LOW (ref >60)
GFR calc non Af Amer: 39 mL/min — ABNORMAL LOW (ref >60)
Glucose, Bld: 705 mg/dL (ref 70–99)
Potassium: 5 mmol/L (ref 3.5–5.1)
Sodium: 129 mmol/L — ABNORMAL LOW (ref 135–145)
Total Bilirubin: 1.4 mg/dL — ABNORMAL HIGH (ref 0.3–1.2)
Total Protein: 8.4 g/dL — ABNORMAL HIGH (ref 6.5–8.1)

## 2019-01-17 LAB — URINALYSIS, COMPLETE (UACMP) WITH MICROSCOPIC
Bacteria, UA: NONE SEEN
Bilirubin Urine: NEGATIVE
Glucose, UA: >=500 mg/dL — AB
Ketones, ur: 80 mg/dL — AB
Leukocytes,Ua: NEGATIVE
Nitrite: NEGATIVE
Protein, ur: 30 mg/dL — AB
Specific Gravity, Urine: 1.018 (ref 1.005–1.030)
pH: 5 (ref 5.0–8.0)

## 2019-01-17 LAB — BLOOD GAS, VENOUS
Acid-base deficit: 23.5 mmol/L — ABNORMAL HIGH (ref 0.0–2.0)
Bicarbonate: 4.4 mmol/L — ABNORMAL LOW (ref 20.0–28.0)
O2 Saturation: 74.7 %
Patient temperature: 37
pCO2, Ven: <19 mmHg — CL (ref 44.0–60.0)
pH, Ven: 7.08 — CL (ref 7.250–7.430)
pO2, Ven: 57 mmHg — ABNORMAL HIGH (ref 32.0–45.0)

## 2019-01-17 LAB — CBC WITH DIFFERENTIAL/PLATELET
Abs Immature Granulocytes: 0.12 10*3/uL — ABNORMAL HIGH (ref 0.00–0.07)
Basophils Absolute: 0.1 10*3/uL (ref 0.0–0.1)
Basophils Relative: 0 %
Eosinophils Absolute: 0 10*3/uL (ref 0.0–0.5)
Eosinophils Relative: 0 %
HCT: 39.9 % (ref 36.0–46.0)
Hemoglobin: 12.8 g/dL (ref 12.0–15.0)
Immature Granulocytes: 1 %
Lymphocytes Relative: 7 %
Lymphs Abs: 1.1 10*3/uL (ref 0.7–4.0)
MCH: 30.4 pg (ref 26.0–34.0)
MCHC: 32.1 g/dL (ref 30.0–36.0)
MCV: 94.8 fL (ref 80.0–100.0)
Monocytes Absolute: 1 10*3/uL (ref 0.1–1.0)
Monocytes Relative: 6 %
Neutro Abs: 13.8 10*3/uL — ABNORMAL HIGH (ref 1.7–7.7)
Neutrophils Relative %: 86 %
Platelets: 592 10*3/uL — ABNORMAL HIGH (ref 150–400)
RBC: 4.21 MIL/uL (ref 3.87–5.11)
RDW: 13.2 % (ref 11.5–15.5)
WBC: 16.1 10*3/uL — ABNORMAL HIGH (ref 4.0–10.5)
nRBC: 0 % (ref 0.0–0.2)

## 2019-01-17 LAB — SARS CORONAVIRUS 2 BY RT PCR (HOSPITAL ORDER, PERFORMED IN ~~LOC~~ HOSPITAL LAB): SARS Coronavirus 2: NEGATIVE

## 2019-01-17 LAB — BASIC METABOLIC PANEL
Anion gap: 12 (ref 5–15)
Anion gap: 11 (ref 5–15)
BUN: 17 mg/dL (ref 6–20)
BUN: 15 mg/dL (ref 6–20)
CO2: 8 mmol/L — ABNORMAL LOW (ref 22–32)
CO2: 11 mmol/L — ABNORMAL LOW (ref 22–32)
Calcium: 8.2 mg/dL — ABNORMAL LOW (ref 8.9–10.3)
Calcium: 8.3 mg/dL — ABNORMAL LOW (ref 8.9–10.3)
Chloride: 114 mmol/L — ABNORMAL HIGH (ref 98–111)
Chloride: 113 mmol/L — ABNORMAL HIGH (ref 98–111)
Creatinine, Ser: 1.14 mg/dL — ABNORMAL HIGH (ref 0.44–1.00)
Creatinine, Ser: 0.85 mg/dL (ref 0.44–1.00)
GFR calc Af Amer: >60 mL/min (ref >60)
GFR calc Af Amer: >60 mL/min (ref >60)
GFR calc non Af Amer: 53 mL/min — ABNORMAL LOW (ref >60)
GFR calc non Af Amer: >60 mL/min (ref >60)
Glucose, Bld: 298 mg/dL — ABNORMAL HIGH (ref 70–99)
Glucose, Bld: 230 mg/dL — ABNORMAL HIGH (ref 70–99)
Potassium: 4.1 mmol/L (ref 3.5–5.1)
Potassium: 3.8 mmol/L (ref 3.5–5.1)
Sodium: 134 mmol/L — ABNORMAL LOW (ref 135–145)
Sodium: 135 mmol/L (ref 135–145)

## 2019-01-17 LAB — GLUCOSE, CAPILLARY
Glucose-Capillary: 572 mg/dL (ref 70–99)
Glucose-Capillary: >600 mg/dL (ref 70–99)
Glucose-Capillary: 510 mg/dL (ref 70–99)
Glucose-Capillary: 359 mg/dL — ABNORMAL HIGH (ref 70–99)
Glucose-Capillary: 250 mg/dL — ABNORMAL HIGH (ref 70–99)
Glucose-Capillary: 170 mg/dL — ABNORMAL HIGH (ref 70–99)
Glucose-Capillary: 262 mg/dL — ABNORMAL HIGH (ref 70–99)
Glucose-Capillary: 219 mg/dL — ABNORMAL HIGH (ref 70–99)
Glucose-Capillary: 171 mg/dL — ABNORMAL HIGH (ref 70–99)
Glucose-Capillary: 154 mg/dL — ABNORMAL HIGH (ref 70–99)

## 2019-01-17 LAB — PROCALCITONIN: Procalcitonin: <0.1 ng/mL

## 2019-01-17 MED ORDER — DIPHENHYDRAMINE HCL 50 MG/ML IJ SOLN
12.5000 mg | Freq: Once | INTRAMUSCULAR | Status: AC
  Administered 2019-01-17: 12.5 mg via INTRAVENOUS
  Filled 2019-01-17: qty 1

## 2019-01-17 MED ORDER — LIDOCAINE HCL (PF) 1 % IJ SOLN
5.0000 mL | Freq: Once | INTRAMUSCULAR | Status: AC
  Administered 2019-01-17: 5 mL via INTRADERMAL
  Filled 2019-01-17: qty 5

## 2019-01-17 MED ORDER — DILTIAZEM HCL ER COATED BEADS 120 MG PO CP24
120.0000 mg | ORAL_CAPSULE | Freq: Two times a day (BID) | ORAL | Status: DC
  Administered 2019-01-17 – 2019-01-18 (×2): 120 mg via ORAL
  Filled 2019-01-17 (×3): qty 1

## 2019-01-17 MED ORDER — BUPROPION HCL ER (SR) 150 MG PO TB12
150.0000 mg | ORAL_TABLET | Freq: Two times a day (BID) | ORAL | Status: DC
  Administered 2019-01-17 – 2019-01-18 (×2): 150 mg via ORAL
  Filled 2019-01-17 (×3): qty 1

## 2019-01-17 MED ORDER — GABAPENTIN 100 MG PO CAPS
100.0000 mg | ORAL_CAPSULE | Freq: Two times a day (BID) | ORAL | Status: DC
  Administered 2019-01-17 – 2019-01-18 (×2): 100 mg via ORAL
  Filled 2019-01-17 (×2): qty 1

## 2019-01-17 MED ORDER — VITAMIN B-1 100 MG PO TABS: 100.0000 mg | ORAL_TABLET | Freq: Every day | ORAL | Status: DC

## 2019-01-17 MED ORDER — QUETIAPINE FUMARATE 25 MG PO TABS: 25.0000 mg | ORAL_TABLET | ORAL | Status: DC | PRN

## 2019-01-17 MED ORDER — DEXTROSE 50 % IV SOLN: 25.0000 mL | INTRAVENOUS | Status: DC | PRN

## 2019-01-17 MED ORDER — ADULT MULTIVITAMIN W/MINERALS CH
1.0000 | ORAL_TABLET | Freq: Every day | ORAL | Status: DC
  Administered 2019-01-18: 1 via ORAL
  Filled 2019-01-17: qty 1

## 2019-01-17 MED ORDER — DILTIAZEM HCL 100 MG IV SOLR
5.0000 mg/h | INTRAVENOUS | Status: DC
  Administered 2019-01-17: 5 mg/h via INTRAVENOUS
  Filled 2019-01-17: qty 100

## 2019-01-17 MED ORDER — INSULIN REGULAR BOLUS VIA INFUSION
0.0000 [IU] | Freq: Three times a day (TID) | INTRAVENOUS | Status: DC
  Filled 2019-01-17: qty 10

## 2019-01-17 MED ORDER — ALBUTEROL SULFATE (2.5 MG/3ML) 0.083% IN NEBU: 2.5000 mg | INHALATION_SOLUTION | Freq: Four times a day (QID) | RESPIRATORY_TRACT | Status: DC | PRN

## 2019-01-17 MED ORDER — LORAZEPAM 2 MG/ML IJ SOLN: 1.0000 mg | Freq: Four times a day (QID) | INTRAMUSCULAR | Status: DC | PRN

## 2019-01-17 MED ORDER — INSULIN ASPART 100 UNIT/ML ~~LOC~~ SOLN
10.0000 [IU] | Freq: Once | SUBCUTANEOUS | Status: AC
  Administered 2019-01-17: 10 [IU] via INTRAVENOUS
  Filled 2019-01-17: qty 1

## 2019-01-17 MED ORDER — INSULIN REGULAR(HUMAN) IN NACL 100-0.9 UT/100ML-% IV SOLN
INTRAVENOUS | Status: AC
  Administered 2019-01-17: 5.5 [IU]/h via INTRAVENOUS
  Administered 2019-01-17: 5.4 [IU]/h via INTRAVENOUS
  Filled 2019-01-17: qty 100

## 2019-01-17 MED ORDER — DILTIAZEM HCL ER COATED BEADS 120 MG PO CP24
120.0000 mg | ORAL_CAPSULE | Freq: Once | ORAL | Status: DC
  Filled 2019-01-17: qty 1

## 2019-01-17 MED ORDER — ACETAMINOPHEN 650 MG RE SUPP: 650.0000 mg | Freq: Four times a day (QID) | RECTAL | Status: DC | PRN

## 2019-01-17 MED ORDER — SODIUM CHLORIDE 0.9 % IV SOLN
INTRAVENOUS | Status: DC
  Administered 2019-01-17: 17:00:00 via INTRAVENOUS

## 2019-01-17 MED ORDER — SODIUM CHLORIDE 0.9 % IV BOLUS
1000.0000 mL | Freq: Once | INTRAVENOUS | Status: AC
  Administered 2019-01-17: 1000 mL via INTRAVENOUS

## 2019-01-17 MED ORDER — DEXTROSE IN LACTATED RINGERS 5 % IV SOLN
INTRAVENOUS | Status: DC
  Administered 2019-01-17 – 2019-01-18 (×2): via INTRAVENOUS

## 2019-01-17 MED ORDER — ASPIRIN EC 81 MG PO TBEC
81.0000 mg | DELAYED_RELEASE_TABLET | Freq: Every day | ORAL | Status: DC
  Administered 2019-01-18: 81 mg via ORAL
  Filled 2019-01-17: qty 1

## 2019-01-17 MED ORDER — QUETIAPINE FUMARATE 25 MG PO TABS: 100.0000 mg | ORAL_TABLET | Freq: Every day | ORAL | Status: DC

## 2019-01-17 MED ORDER — ENOXAPARIN SODIUM 40 MG/0.4ML ~~LOC~~ SOLN
40.0000 mg | SUBCUTANEOUS | Status: DC
  Administered 2019-01-17: 40 mg via SUBCUTANEOUS
  Filled 2019-01-17: qty 0.4

## 2019-01-17 MED ORDER — LORAZEPAM 1 MG PO TABS: 1.0000 mg | ORAL_TABLET | Freq: Four times a day (QID) | ORAL | Status: DC | PRN

## 2019-01-17 MED ORDER — FOLIC ACID 1 MG PO TABS: 1.0000 mg | ORAL_TABLET | Freq: Every day | ORAL | Status: DC

## 2019-01-17 MED ORDER — CHLORHEXIDINE GLUCONATE CLOTH 2 % EX PADS
6.0000 | MEDICATED_PAD | Freq: Every day | CUTANEOUS | Status: DC
  Administered 2019-01-17: 6 via TOPICAL

## 2019-01-17 MED ORDER — ACETAMINOPHEN 325 MG PO TABS: 650.0000 mg | ORAL_TABLET | Freq: Four times a day (QID) | ORAL | Status: DC | PRN

## 2019-01-17 MED ORDER — DEXTROSE-NACL 5-0.45 % IV SOLN
INTRAVENOUS | Status: DC
  Administered 2019-01-17: 20:00:00 via INTRAVENOUS

## 2019-01-17 MED ORDER — PROCHLORPERAZINE EDISYLATE 10 MG/2ML IJ SOLN
10.0000 mg | Freq: Once | INTRAMUSCULAR | Status: AC
  Administered 2019-01-17: 10 mg via INTRAVENOUS
  Filled 2019-01-17: qty 2

## 2019-01-17 MED ORDER — DILTIAZEM HCL ER COATED BEADS 120 MG PO CP24: 120.0000 mg | ORAL_CAPSULE | Freq: Two times a day (BID) | ORAL | Status: DC

## 2019-01-17 MED ORDER — LURASIDONE HCL 40 MG PO TABS
40.0000 mg | ORAL_TABLET | Freq: Every evening | ORAL | Status: DC
  Administered 2019-01-17: 40 mg via ORAL
  Filled 2019-01-17 (×2): qty 1

## 2019-01-17 NOTE — ED Triage Notes (Signed)
Pt in via [facility] with c/o high blood sugar, nausea, and pain over her body. EMS reports per pt she was recently d/c from [facility] where she had been admitted for a diabetic coma. Pt with hx of HTN, diabetes and pancreatic cancer. Pt admits to being non compliant with her meds because noone has told her what they are for. BP 115/80, 120 HR, 97.6temp, FSBS reading high. EMS administered 4mg  zofran IM en route.

## 2019-01-17 NOTE — Consult Note (Signed)
[facility]   Reason for Consult:  Medication managment Referring Physician:  EDP Patient Identification: Pamela MoraleMary G Dallman MRN:  [id number] Principal Diagnosis: DKA (diabetic ketoacidoses) [facility](HCC) Diagnosis:  Principal Problem:   DKA (diabetic ketoacidoses) (HCC) Active Problems:   Bipolar disorder (HCC)   Total Time spent with patient: 20 minutes  Subjective:   Pamela Lane is a 58 y.o. female patient reports that she has not had her medications for 2 to 3 days.  Patient states that she does not want take Seroquel anymore.  Patient uses a lot of expletives to describe the medication as well as other medications and the people she is interacting with this far staff at the hospital.  Patient states that she does not like taking Seroquel because it is not helped her any she has not been taking it regularly anyway.  Patient is asked about other medications and patient states that she has not been taking her Wellbutrin and several years ago she had taken Depakote but she does not like it because it caused her to have headache.  Patient denies any suicidal or homicidal ideations and denies any hallucinations.  Patient did state that occasionally she will have suicidal thoughts but she has not had any today or yesterday.  HPI: Patient is a 58 year old female who was admitted due to DKA.  Patient has frequent visits to the emergency room for hyperglycemia and has been admitted 3 times in the last month due to DKA.  Patient is a very poor historian as well as a noncompliant diabetic.  Patient's wife reports that she has been diagnosed with bipolar disorder in the past.  She states that she has been on Seroquel for a while but does not take it regularly and she has not taken it for the last 2 to 3 days.  There is concern for the patient taking Seroquel due to her diabetes as it can increase her blood sugar levels.    I have contacted Dr. Lucianne MussKumar and consulted with her.  Due to  patient's compliance did not feel comfortable with starting Depakote again as well as not starting lithium as patient would need frequent follow-up for lab checks.  Discussed starting Geodon versus Latuda insensitive to this once a day decided that it would be a better option with the decrease chance of increasing the patient's blood sugar levels.  At this moment the patient does not meet inpatient criteria as she has denied any suicidal or homicidal ideations and denies any hallucinations.  Patient will be followed up tomorrow to assess progression and for adverse effects to JordanLatuda.  Recommendations for medication including discontinuing Seroquel 100 mg p.o. nightly, continuing Seroquel 25 mg p.o. 4 times daily PRN for agitation, and starting Latuda 40 mg p.o. daily.  Past Psychiatric History: Bipolar disorder, MDD, aggression  Risk to Self:   Risk to Others:   Prior Inpatient Therapy:   Prior Outpatient Therapy:    Past Medical History:  Past Medical History:  Diagnosis Date  . Asthma   . Clostridium difficile colitis   . Diabetes mellitus without complication (HCC)   . Hypertension   . Mental disorder    pt reports 'I have all of them mental disorders'    Past Surgical History:  Procedure Laterality Date  . ABDOMINAL HYSTERECTOMY     Family History:  Family History  Problem Relation Age of Onset  . Hypertension Mother    Family Psychiatric  History: Denies Social History:  Social History  Substance and Sexual Activity  Alcohol Use Yes  . Alcohol/week: 40.0 standard drinks  . Types: 40 Cans of beer per week   Comment: 3 big bottles of liquor     Social History   Substance and Sexual Activity  Drug Use Yes  . Types: Marijuana, Cocaine    Social History   Socioeconomic History  . Marital status: Single    Spouse name: Not on file  . Number of children: Not on file  . Years of education: Not on file  . Highest education level: Not on file  Occupational History  .  Not on file  Social Needs  . Financial resource strain: Not on file  . Food insecurity    Worry: Not on file    Inability: Not on file  . Transportation needs    Medical: Not on file    Non-medical: Not on file  Tobacco Use  . Smoking status: Current Every Day Smoker    Packs/day: 0.10    Types: Cigarettes  . Smokeless tobacco: Never Used  Substance and Sexual Activity  . Alcohol use: Yes    Alcohol/week: 40.0 standard drinks    Types: 40 Cans of beer per week    Comment: 3 big bottles of liquor  . Drug use: Yes    Types: Marijuana, Cocaine  . Sexual activity: Not on file  Lifestyle  . Physical activity    Days per week: Not on file    Minutes per session: Not on file  . Stress: Not on file  Relationships  . Social Musician on phone: Not on file    Gets together: Not on file    Attends religious service: Not on file    Active member of club or organization: Not on file    Attends meetings of clubs or organizations: Not on file    Relationship status: Not on file  Other Topics Concern  . Not on file  Social History Narrative  . Not on file   Additional Social History:    Allergies:  No Known Allergies  Labs:  Results for orders placed or performed during the hospital encounter of 01/17/19 (from the past 48 hour(s))  CBC with Differential/Platelet     Status: Abnormal   Collection Time: 01/17/19 11:11 AM  Result Value Ref Range   WBC 16.1 (H) 4.0 - 10.5 K/uL   RBC 4.21 3.87 - 5.11 MIL/uL   Hemoglobin 12.8 12.0 - 15.0 g/dL   HCT 16.1 09.6 - 04.5 %   MCV 94.8 80.0 - 100.0 fL   MCH 30.4 26.0 - 34.0 pg   MCHC 32.1 30.0 - 36.0 g/dL   RDW 40.9 81.1 - 91.4 %   Platelets 592 (H) 150 - 400 K/uL   nRBC 0.0 0.0 - 0.2 %   Neutrophils Relative % 86 %   Neutro Abs 13.8 (H) 1.7 - 7.7 K/uL   Lymphocytes Relative 7 %   Lymphs Abs 1.1 0.7 - 4.0 K/uL   Monocytes Relative 6 %   Monocytes Absolute 1.0 0.1 - 1.0 K/uL   Eosinophils Relative 0 %   Eosinophils  Absolute 0.0 0.0 - 0.5 K/uL   Basophils Relative 0 %   Basophils Absolute 0.1 0.0 - 0.1 K/uL   Immature Granulocytes 1 %   Abs Immature Granulocytes 0.12 (H) 0.00 - 0.07 K/uL    Comment: Performed at [facility], [address] [place], [place] [address]  Comprehensive metabolic panel  Status: Abnormal   Collection Time: 01/17/19 11:11 AM  Result Value Ref Range   Sodium 129 (L) 135 - 145 mmol/L   Potassium 5.0 3.5 - 5.1 mmol/L   Chloride 99 98 - 111 mmol/L   CO2 <7 (L) 22 - 32 mmol/L   Glucose, Bld 705 (HH) 70 - 99 mg/dL    Comment: CRITICAL RESULT CALLED TO, READ BACK BY AND VERIFIED WITH JESSICA FULCHER RN AT 1320 ON 01/17/2019 SNG    BUN 23 (H) 6 - 20 mg/dL   Creatinine, Ser 1.30 (H) 0.44 - 1.00 mg/dL   Calcium 9.4 8.9 - 86.5 mg/dL   Total Protein 8.4 (H) 6.5 - 8.1 g/dL   Albumin 4.0 3.5 - 5.0 g/dL   AST 10 (L) 15 - 41 U/L   ALT 11 0 - 44 U/L   Alkaline Phosphatase 118 38 - 126 U/L   Total Bilirubin 1.4 (H) 0.3 - 1.2 mg/dL   GFR calc non Af Amer 39 (L) >60 mL/min   GFR calc Af Amer 45 (L) >60 mL/min    Comment: Performed at [facility], [address] [place], [place] [address]  Urinalysis, Complete w Microscopic     Status: Abnormal   Collection Time: 01/17/19 11:12 AM  Result Value Ref Range   Color, Urine STRAW (A) YELLOW   APPearance CLEAR (A) CLEAR   Specific Gravity, Urine 1.018 1.005 - 1.030   pH 5.0 5.0 - 8.0   Glucose, UA >=500 (A) NEGATIVE mg/dL   Hgb urine dipstick SMALL (A) NEGATIVE   Bilirubin Urine NEGATIVE NEGATIVE   Ketones, ur 80 (A) NEGATIVE mg/dL   Protein, ur 30 (A) NEGATIVE mg/dL   Nitrite NEGATIVE NEGATIVE   Leukocytes,Ua NEGATIVE NEGATIVE   RBC / HPF 0-5 0 - 5 RBC/hpf   WBC, UA 0-5 0 - 5 WBC/hpf   Bacteria, UA NONE SEEN NONE SEEN   Squamous Epithelial / LPF 0-5 0 - 5   Mucus PRESENT     Comment: Performed at [facility], [address] [place], [place] [address]  Blood gas, venous     Status: Abnormal    Collection Time: 01/17/19 11:12 AM  Result Value Ref Range   pH, Ven 7.08 (LL) 7.250 - 7.430    Comment: CRITICAL RESULT CALLED TO, READ BACK BY AND VERIFIED WITH: CRITICAL VALUE 01/17/19,DR. ROBINSON/FD    pCO2, Ven <19.0 (LL) 44.0 - 60.0 mmHg    Comment: CRITICAL RESULT CALLED TO, READ BACK BY AND VERIFIED WITH: CRITICAL VALUE 01/17/19,DR. ROBINSON/FD    pO2, Ven 57.0 (H) 32.0 - 45.0 mmHg   Bicarbonate 4.4 (L) 20.0 - 28.0 mmol/L   Acid-base deficit 23.5 (H) 0.0 - 2.0 mmol/L   O2 Saturation 74.7 %   Patient temperature 37.0    Collection site VEIN    Sample type VEIN     Comment: Performed at [facility], [address] [place], [place] [address]  Glucose, capillary     Status: Abnormal   Collection Time: 01/17/19 12:09 PM  Result Value Ref Range   Glucose-Capillary 572 (HH) 70 - 99 mg/dL  SARS Coronavirus 2 (CEPHEID- Performed in [facility] hospital lab), Hosp Order     Status: None   Collection Time: 01/17/19 12:38 PM   Specimen: Nasopharyngeal Swab  Result Value Ref Range   SARS Coronavirus 2 NEGATIVE NEGATIVE    Comment: (NOTE) If result is NEGATIVE SARS-CoV-2 target nucleic acids are NOT DETECTED. The SARS-CoV-2 RNA is generally  detectable in upper and lower  respiratory specimens during the acute phase of infection. The lowest  concentration of SARS-CoV-2 viral copies this assay can detect is 250  copies / mL. A negative result does not preclude SARS-CoV-2 infection  and should not be used as the sole basis for treatment or other  patient management decisions.  A negative result may occur with  improper specimen collection / handling, submission of specimen other  than nasopharyngeal swab, presence of viral mutation(s) within the  areas targeted by this assay, and inadequate number of viral copies  (<250 copies / mL). A negative result must be combined with clinical  observations, patient history, and epidemiological information. If result is  POSITIVE SARS-CoV-2 target nucleic acids are DETECTED. The SARS-CoV-2 RNA is generally detectable in upper and lower  respiratory specimens dur ing the acute phase of infection.  Positive  results are indicative of active infection with SARS-CoV-2.  Clinical  correlation with patient history and other diagnostic information is  necessary to determine patient infection status.  Positive results do  not rule out bacterial infection or co-infection with other viruses. If result is PRESUMPTIVE POSTIVE SARS-CoV-2 nucleic acids MAY BE PRESENT.   A presumptive positive result was obtained on the submitted specimen  and confirmed on repeat testing.  While 2019 novel coronavirus  (SARS-CoV-2) nucleic acids may be present in the submitted sample  additional confirmatory testing may be necessary for epidemiological  and / or clinical management purposes  to differentiate between  SARS-CoV-2 and other Sarbecovirus currently known to infect humans.  If clinically indicated additional testing with an alternate test  methodology [phone]) is advised. The SARS-CoV-2 RNA is generally  detectable in upper and lower respiratory sp ecimens during the acute  phase of infection. The expected result is Negative. Fact Sheet for Patients:  StrictlyIdeas.no Fact Sheet for Healthcare Providers: BankingDealers.co.za This test is not yet approved or cleared by the [place] FDA and has been authorized for detection and/or diagnosis of SARS-CoV-2 by FDA under an Emergency Use Authorization (EUA).  This EUA will remain in effect (meaning this test can be used) for the duration of the COVID-19 declaration under Section 564(b)(1) of the Act, 21 U.S.C. section 360bbb-3(b)(1), unless the authorization is terminated or revoked sooner. Performed at [facility], [address] [place], [place] [address]   Glucose, capillary     Status: Abnormal    Collection Time: 01/17/19  1:38 PM  Result Value Ref Range   Glucose-Capillary >600 (HH) 70 - 99 mg/dL  Glucose, capillary     Status: Abnormal   Collection Time: 01/17/19  2:39 PM  Result Value Ref Range   Glucose-Capillary 510 (HH) 70 - 99 mg/dL  Glucose, capillary     Status: Abnormal   Collection Time: 01/17/19  4:05 PM  Result Value Ref Range   Glucose-Capillary 359 (H) 70 - 99 mg/dL  Basic metabolic panel     Status: Abnormal   Collection Time: 01/17/19  5:17 PM  Result Value Ref Range   Sodium 134 (L) 135 - 145 mmol/L   Potassium 4.1 3.5 - 5.1 mmol/L   Chloride 114 (H) 98 - 111 mmol/L   CO2 8 (L) 22 - 32 mmol/L   Glucose, Bld 298 (H) 70 - 99 mg/dL   BUN 17 6 - 20 mg/dL   Creatinine, Ser 1.14 (H) 0.44 - 1.00 mg/dL   Calcium 8.2 (L) 8.9 - 10.3 mg/dL   GFR calc non Af Amer 53 (  L) >60 mL/min   GFR calc Af Amer >60 >60 mL/min   Anion gap 12 5 - 15    Comment: Performed at [facility], [address] [place], [place] [address]    Current Facility-Administered Medications  Medication Dose Route Frequency Provider Last Rate Last Dose  . 0.9 %  sodium chloride infusion   Intravenous Continuous Alford HighlandWieting, Richard, MD 150 mL/hr at 01/17/19 1729    . acetaminophen (TYLENOL) tablet 650 mg  650 mg Oral Q6H PRN Alford HighlandWieting, Richard, MD       Or  . acetaminophen (TYLENOL) suppository 650 mg  650 mg Rectal Q6H PRN Wieting, Richard, MD      . albuterol (PROVENTIL) (2.5 MG/3ML) 0.083% nebulizer solution 2.5 mg  2.5 mg Inhalation Q6H PRN Alford HighlandWieting, Richard, MD      . Melene Muller[START ON 01/18/2019] aspirin EC tablet 81 mg  81 mg Oral Daily Wieting, Richard, MD      . buPROPion [facility](WELLBUTRIN SR) 12 hr tablet 150 mg  150 mg Oral BID Alford HighlandWieting, Richard, MD      . Chlorhexidine Gluconate Cloth 2 % PADS 6 each  6 each Topical Daily Merwyn KatosSimonds, David B, MD   6 each at 01/17/19 1710  . dextrose 5 %-0.45 % sodium chloride infusion   Intravenous Continuous Alford HighlandWieting, Richard, MD   Stopped at 01/17/19 1731  .  dextrose 50 % solution 25 mL  25 mL Intravenous PRN Alford HighlandWieting, Richard, MD      . diltiazem (CARDIZEM CD) 24 hr capsule 120 mg  120 mg Oral Once Willy Eddyobinson, Patrick, MD      . diltiazem (CARDIZEM CD) 24 hr capsule 120 mg  120 mg Oral BID Wieting, Richard, MD      . diltiazem (CARDIZEM) 100 mg in dextrose 5 % 100 mL (1 mg/mL) infusion  5-15 mg/hr Intravenous Continuous Alford HighlandWieting, Richard, MD   Stopped at 01/17/19 1731  . enoxaparin (LOVENOX) injection 40 mg  40 mg Subcutaneous Q24H Alford HighlandWieting, Richard, MD      . Melene Muller[START ON 01/18/2019] folic acid (FOLVITE) tablet 1 mg  1 mg Oral Daily Wieting, Richard, MD      . gabapentin (NEURONTIN) capsule 100 mg  100 mg Oral BID Wieting, Richard, MD      . insulin regular bolus via infusion 0-10 Units  0-10 Units Intravenous TID WC Wieting, Richard, MD      . insulin regular, human (MYXREDLIN) 100 units/ 100 mL infusion   Intravenous Continuous Wieting, Richard, MD 5.5 mL/hr at 01/17/19 1700 5.5 Units/hr at 01/17/19 1700  . lurasidone (LATUDA) tablet 40 mg  40 mg Oral QPM Wieting, Richard, MD      . Melene Muller[START ON 01/18/2019] multivitamin with minerals tablet 1 tablet  1 tablet Oral Daily Wieting, Richard, MD      . QUEtiapine (SEROQUEL) tablet 25 mg  25 mg Oral Q4H PRN Alford HighlandWieting, Richard, MD      . Melene Muller[START ON 01/18/2019] thiamine (VITAMIN B-1) tablet 100 mg  100 mg Oral Daily Alford HighlandWieting, Richard, MD        Musculoskeletal: Strength & Muscle Tone: decreased Gait & Station: Patient remained in bed during evaluation Patient leans: N/A  Psychiatric Specialty Exam: Physical Exam  Nursing note and vitals reviewed. Constitutional: She is oriented to person, place, and time. She appears well-developed and well-nourished.  Respiratory: Effort normal.  Neurological: She is alert and oriented to person, place, and time.  Skin: Skin is warm.    Review of Systems  Psychiatric/Behavioral:  Self reported bipolar disorder, aggression, irritable    Blood pressure 127/80, pulse (!) 105,  temperature 97.9 F (36.6 C), temperature source Oral, resp. rate (!) 25, height 5\' 3"  (1.6 m), weight 86.5 kg, SpO2 100 %.Body mass index is 33.78 kg/m.  General Appearance: Guarded  Eye Contact:  Minimal  Speech:  Clear and Coherent  Volume:  Decreased  Mood:  Irritable  Affect:  Congruent  Thought Process:  Linear and Descriptions of Associations: Intact  Orientation:  Full (Time, Place, and Person)  Thought Content:  WDL  Suicidal Thoughts:  No  Homicidal Thoughts:  No  Memory:  Immediate;   Good Recent;   Good Remote;   Poor  Judgement:  Fair  Insight:  Lacking  Psychomotor Activity:  Decreased  Concentration:  Concentration: Good and Attention Span: Good  Recall:  NA  Fund of Knowledge:  Good  Language:  Fair  Akathisia:  No  Handed:  Right  AIMS (if indicated):     Assets:  Financial Resources/Insurance Housing Resilience Social Support  ADL's:  Intact  Cognition:  WNL  Sleep:        Treatment Plan Summary: Daily contact with patient to assess and evaluate symptoms and progress in treatment and Medication management  Due to patient's frequent visits to the ED for hyperglycemia and DKA there is concern for continuing on Seroquel as it may worsen the patient's diabetes. Discontinue Seroquel 100 mg p.o. nightly Continue Seroquel 25 mg p.o. 4 times daily as needed for agitation Start Latuda 40 mg p.o. daily with supper Continue Wellbutrin SR 150 mg p.o. twice daily Follow-up tomorrow with patient progression  Disposition: At this time th epatient does not meet inpatient criteria, but due to current situation will follow above recommendations and follow up with patient tomorrow.  Gerlene Burdockravis B Dilan Fullenwider, FNP 01/17/2019 5:53 PM

## 2019-01-17 NOTE — ED Provider Notes (Signed)
[facility] Emergency Department Provider Note    First MD Initiated Contact with Patient 01/17/19 1046     (approximate)  I have reviewed the triage vital signs and the nursing notes.   HISTORY  Chief Complaint Hyperglycemia and Nausea  Level V Caveat:  agitation  HPI Pamela Lane is a 58 y.o. female close past medical history presents the ER for nausea headache shortness of breath and elevated blood sugars.  Patient with frequent admission to hospital for hyperglycemia.  Has been in DKA previously.  Patient very agitated and somewhat difficult to redirect.  Intermittently yelling at staff.  Somewhat difficult to get a reliable history however does appear critically ill.    Past Medical History:  Diagnosis Date   Asthma    Diabetes mellitus without complication ([place])    Hypertension    Mental disorder    pt reports 'I have all of them mental disorders'   Family History  Problem Relation Age of Onset   Hypertension Mother    Past Surgical History:  Procedure Laterality Date   ABDOMINAL HYSTERECTOMY     Patient Active Problem List   Diagnosis Date Noted   Acute pancreatitis 05/15/2016   AKI (acute kidney injury) ([place])    Diabetic ketoacidosis without coma associated with type 2 diabetes mellitus ([place])    Hyperglycemia due to type 2 diabetes mellitus ([place]) 01/13/2016   UTI (urinary tract infection) 01/13/2016   DKA (diabetic ketoacidoses) ([place]) 01/08/2016   Hyponatremia 01/08/2016   Dehydration 01/08/2016   Urinary tract infection 01/08/2016   Upper abdominal pain 01/08/2016   Nausea & vomiting 01/08/2016      Prior to Admission medications   Medication Sig Start Date End Date Taking? Authorizing Provider  albuterol (PROVENTIL HFA;VENTOLIN HFA) 108 (90 Base) MCG/ACT inhaler Inhale 2 puffs into the lungs every 6 (six) hours as needed for wheezing or shortness of breath. 02/18/18   Dustin Flock, MD  aspirin EC 81  MG tablet Take 81 mg by mouth daily.    [provider]  blood glucose meter kit and supplies KIT Dispense based on patient and insurance preference. Use up to four times daily as directed. (FOR ICD-9 250.00, 250.01). 01/06/19   Gouru, Illene Silver, MD  buPROPion (WELLBUTRIN SR) 150 MG 12 hr tablet Take 150 mg by mouth 2 (two) times daily.    [provider]  diltiazem (CARDIZEM CD) 120 MG 24 hr capsule Take 1 capsule (120 mg total) by mouth 2 (two) times a day. 01/06/19   Nicholes Mango, MD  folic acid (FOLVITE) 1 MG tablet Take 1 tablet (1 mg total) by mouth daily. 03/08/18   Nicholes Mango, MD  gabapentin (NEURONTIN) 100 MG capsule Take 1 capsule (100 mg total) by mouth 2 (two) times daily. 01/06/19   Gouru, Illene Silver, MD  glipiZIDE (GLUCOTROL XL) 5 MG 24 hr tablet Take 1 tablet (5 mg total) by mouth daily with breakfast. 01/06/19 01/07/20  Gouru, Illene Silver, MD  insulin aspart (NOVOLOG) 100 UNIT/ML injection Inject 5 Units into the skin 3 (three) times daily with meals. 01/06/19   Gouru, Illene Silver, MD  Insulin Glargine (LANTUS) 100 UNIT/ML Solostar Pen Inject 30 Units into the skin daily. 01/06/19   Gouru, Illene Silver, MD  Insulin Pen Needle 32G X 5 MM MISC 30 Units by Does not apply route daily. 01/06/19   Gouru, Illene Silver, MD  ipratropium-albuterol (DUONEB) 0.5-2.5 (3) MG/3ML SOLN Take 3 mLs by nebulization every 6 (six) hours as needed for wheezing,  shortness of breath or cough. 02/23/18   [provider]  lisinopril (PRINIVIL) 10 MG tablet Take 1 tablet (10 mg total) by mouth daily. 02/18/18   Dustin Flock, MD  metFORMIN (GLUCOPHAGE) 500 MG tablet Take 1 tablet (500 mg total) by mouth 2 (two) times daily with a meal. 01/06/19   Gouru, Aruna, MD  Multiple Vitamin (MULTIVITAMIN WITH MINERALS) TABS tablet Take 1 tablet by mouth daily. 03/08/18   Nicholes Mango, MD  QUEtiapine (SEROQUEL) 100 MG tablet Take 1 tablet (100 mg total) by mouth at bedtime. 01/06/19   Gouru, Illene Silver, MD  QUEtiapine (SEROQUEL) 25 MG tablet  Take 25 mg by mouth every 4 (four) hours as needed. 03/02/18   [provider]  senna-docusate (SENOKOT-S) 8.6-50 MG tablet Take 1 tablet by mouth at bedtime as needed for mild constipation. 03/07/18   Nicholes Mango, MD  thiamine 100 MG tablet Take 1 tablet (100 mg total) by mouth daily. 03/08/18   Nicholes Mango, MD    Allergies Patient has no known allergies.    Social History Social History   Tobacco Use   Smoking status: Current Every Day Smoker    Packs/day: 0.10    Types: Cigarettes   Smokeless tobacco: Never Used  Substance Use Topics   Alcohol use: Yes    Alcohol/week: 40.0 standard drinks    Types: 40 Cans of beer per week    Comment: 3 big bottles of liquor   Drug use: Yes    Types: Marijuana, Cocaine    Review of Systems Patient denies headaches, rhinorrhea, blurry vision, numbness, shortness of breath, chest pain, edema, cough, abdominal pain, nausea, vomiting, diarrhea, dysuria, fevers, rashes or hallucinations unless otherwise stated above in HPI. ____________________________________________   PHYSICAL EXAM:  VITAL SIGNS: Vitals:   01/17/19 1300 01/17/19 1304  BP: (!) 167/120   Pulse:    Resp:    Temp:  (!) 97.5 F (36.4 C)  SpO2:      Constitutional: Alert , kussmaul respirations, agitated but protecting airway Eyes: Conjunctivae are normal.  Head: Atraumatic. Nose: No congestion/rhinnorhea. Mouth/Throat: Mucous membranes are moist.   Neck: No stridor. Painless ROM.  Cardiovascular: tachycardic, regular rhythm. Grossly normal heart sounds.  Good peripheral circulation. Respiratory: kussmaul respirations.  No retractions. Lungs CTAB. Gastrointestinal: Soft and nontender. No distention. No abdominal bruits. No CVA tenderness. Genitourinary:  Musculoskeletal: No lower extremity tenderness nor edema.  No joint effusions. Neurologic:  Normal speech and language. No gross focal neurologic deficits are appreciated. No facial droop Skin:  Skin is  warm, dry and intact. No rash noted. Psychiatric: Mood and affect anxious and agitated.   ____________________________________________   LABS (all labs ordered are listed, but only abnormal results are displayed)  Results for orders placed or performed during the hospital encounter of 01/17/19 (from the past 24 hour(s))  CBC with Differential/Platelet     Status: Abnormal   Collection Time: 01/17/19 11:11 AM  Result Value Ref Range   WBC 16.1 (H) 4.0 - 10.5 K/uL   RBC 4.21 3.87 - 5.11 MIL/uL   Hemoglobin 12.8 12.0 - 15.0 g/dL   HCT 39.9 36.0 - 46.0 %   MCV 94.8 80.0 - 100.0 fL   MCH 30.4 26.0 - 34.0 pg   MCHC 32.1 30.0 - 36.0 g/dL   RDW 13.2 11.5 - 15.5 %   Platelets 592 (H) 150 - 400 K/uL   nRBC 0.0 0.0 - 0.2 %   Neutrophils Relative % 86 %   Neutro Abs  13.8 (H) 1.7 - 7.7 K/uL   Lymphocytes Relative 7 %   Lymphs Abs 1.1 0.7 - 4.0 K/uL   Monocytes Relative 6 %   Monocytes Absolute 1.0 0.1 - 1.0 K/uL   Eosinophils Relative 0 %   Eosinophils Absolute 0.0 0.0 - 0.5 K/uL   Basophils Relative 0 %   Basophils Absolute 0.1 0.0 - 0.1 K/uL   Immature Granulocytes 1 %   Abs Immature Granulocytes 0.12 (H) 0.00 - 0.07 K/uL  Urinalysis, Complete w Microscopic     Status: Abnormal   Collection Time: 01/17/19 11:12 AM  Result Value Ref Range   Color, Urine STRAW (A) YELLOW   APPearance CLEAR (A) CLEAR   Specific Gravity, Urine 1.018 1.005 - 1.030   pH 5.0 5.0 - 8.0   Glucose, UA >=500 (A) NEGATIVE mg/dL   Hgb urine dipstick SMALL (A) NEGATIVE   Bilirubin Urine NEGATIVE NEGATIVE   Ketones, ur 80 (A) NEGATIVE mg/dL   Protein, ur 30 (A) NEGATIVE mg/dL   Nitrite NEGATIVE NEGATIVE   Leukocytes,Ua NEGATIVE NEGATIVE   RBC / HPF 0-5 0 - 5 RBC/hpf   WBC, UA 0-5 0 - 5 WBC/hpf   Bacteria, UA NONE SEEN NONE SEEN   Squamous Epithelial / LPF 0-5 0 - 5   Mucus PRESENT   Blood gas, venous     Status: Abnormal   Collection Time: 01/17/19 11:12 AM  Result Value Ref Range   pH, Ven 7.08 (LL)  7.250 - 7.430   pCO2, Ven <19.0 (LL) 44.0 - 60.0 mmHg   pO2, Ven 57.0 (H) 32.0 - 45.0 mmHg   Bicarbonate 4.4 (L) 20.0 - 28.0 mmol/L   Acid-base deficit 23.5 (H) 0.0 - 2.0 mmol/L   O2 Saturation 74.7 %   Patient temperature 37.0    Collection site VEIN    Sample type VEIN   Glucose, capillary     Status: Abnormal   Collection Time: 01/17/19 12:09 PM  Result Value Ref Range   Glucose-Capillary 572 (HH) 70 - 99 mg/dL   ____________________________________________  EKG My review and personal interpretation at Time: 14:40   Indication: tachycardia  Rate: 140  Rhythm: sinus Axis: normal Other: normal intervals, no stemi ____________________________________________  RADIOLOGY  I personally reviewed all radiographic images ordered to evaluate for the above acute complaints and reviewed radiology reports and findings.  These findings were personally discussed with the patient.  Please see medical record for radiology report.  ____________________________________________   PROCEDURES  Procedure(s) performed:  .Critical Care Performed by: Merlyn Lot, MD Authorized by: Merlyn Lot, MD   Critical care provider statement:    Critical care time (minutes):  40   Critical care time was exclusive of:  Separately billable procedures and treating other patients   Critical care was necessary to treat or prevent imminent or life-threatening deterioration of the following conditions:  Endocrine crisis   Critical care was time spent personally by me on the following activities:  Development of treatment plan with patient or surrogate, discussions with consultants, evaluation of patient's response to treatment, examination of patient, obtaining history from patient or surrogate, ordering and performing treatments and interventions, ordering and review of laboratory studies, ordering and review of radiographic studies, pulse oximetry, re-evaluation of patient's condition and review of old  charts .Central Line  Date/Time: 01/17/2019 1:24 PM Performed by: Merlyn Lot, MD Authorized by: Merlyn Lot, MD   Consent:    Consent obtained:  Emergent situation Pre-procedure details:    Hand hygiene:  Hand hygiene performed prior to insertion     Sterile barrier technique: All elements of maximal sterile technique followed     Skin preparation:  2% chlorhexidine   Skin preparation agent: Skin preparation agent completely dried prior to procedure   Procedure details:    Location:  R internal jugular   Patient position:  Trendelenburg   Procedural supplies:  Triple lumen   Catheter size:  7 Fr   Landmarks identified: yes     Ultrasound guidance: yes     Sterile ultrasound techniques: Sterile gel and sterile probe covers were used     Number of attempts:  1   Successful placement: yes   Post-procedure details:    Post-procedure:  Dressing applied and line sutured   Assessment:  Blood return through all ports and free fluid flow   Patient tolerance of procedure:  Tolerated well, no immediate complications      Critical Care performed: yes ____________________________________________   INITIAL IMPRESSION / ASSESSMENT AND PLAN / ED COURSE  Pertinent labs & imaging results that were available during my care of the patient were reviewed by me and considered in my medical decision making (see chart for details).   DDX: DKA, HHS, HH [place], dehydration, sepsis  Pamela Lane is a 58 y.o. who presents to the ED with symptoms as described above.  Patient ill-appearing.  Very agitated but I am able to redirect her.  Patient with very difficult IV access but she is q. Smalling with blood sugar greater than 500 I am concerned for DKA and decide to proceed with emergent central line placement to obtain blood work creatinine IV fluid resuscitation.  The patient will be placed on continuous pulse oximetry and telemetry for monitoring.  Laboratory evaluation will be sent to  evaluate for the above complaints.     Clinical Course as of Jan 16 1530  Wed Jan 17, 2019  0630 Blood work certainly concerning for DKA.  IV fluid resuscitation infusing now after obtaining central line access.  Will give IV pain medication for headache.   [PR]  1444 Repeat EKG shows sinus tachycardia.  She supposed to be on Cardizem and has been noncompliant with her medications.  We will continue with IV hydration will order her home Cardizem.  Will order IV Cardizem as well.   [PR]    Clinical Course User Index [PR] Merlyn Lot, MD    The patient was evaluated in Emergency Department today for the symptoms described in the history of present illness. He/she was evaluated in the context of the global COVID-19 pandemic, which necessitated consideration that the patient might be at risk for infection with the SARS-CoV-2 virus that causes COVID-19. Institutional protocols and algorithms that pertain to the evaluation of patients at risk for COVID-19 are in a state of rapid change based on information released by regulatory bodies including the CDC and federal and state organizations. These policies and algorithms were followed during the patient's care in the ED.  As part of my medical decision making, I reviewed the following data within the [facility] notes reviewed and incorporated, Labs reviewed, notes from prior ED visits and [place] Controlled Substance Database   ____________________________________________   FINAL CLINICAL IMPRESSION(S) / ED DIAGNOSES  Final diagnoses:  Type 1 diabetes mellitus with ketoacidosis without coma ([place])      NEW MEDICATIONS STARTED DURING THIS VISIT:  New Prescriptions   No medications on file     Note:  This  document was prepared using Systems analyst and may include unintentional dictation errors.    Merlyn Lot, MD 01/17/19 1534

## 2019-01-17 NOTE — ED Notes (Signed)
Report given to ICU RN

## 2019-01-17 NOTE — Consult Note (Signed)
Name: Pamela Lane MRN: [id number] DOB: 1960-08-12    ADMISSION DATE:  01/17/2019 CONSULTATION DATE: 01/17/2019  REFERRING MD : Dr. Leslye Peer   CHIEF COMPLAINT: Hyperglycemia   BRIEF PATIENT DESCRIPTION:  58 yo female admitted with DKA requiring insulin gtt   SIGNIFICANT EVENTS/STUDIES:  07/8-Pt admitted to the stepdown unit on insulin gtt  CULTURES:  Blood x2 07/8>> SARS Coronovirus 07/8>>negative   HISTORY OF PRESENT ILLNESS:   This is a 58 yo female with a PMH of Mental Disorder, HTN, Cdiff Colitis, Uncontrolled Type II Diabetes Mellitus, and Asthma.  She presented to [facility] ER on 07/8 via EMS with hyperglycemia, nausea, and generalized body aches.   She was recently discharged from [facility] on 01/06/2019 following treatment of DKA and cdiff colitis during hospitalization she did require mechanical intubation. Per discharge summary she should have completed course of po vancomycin on 01/16/2019, however she states she doesn't know if she was taking vancomycin at home.  She reports she has not had diarrhea since discharge. During current presentation lab results revealed Na+ 129, CO2 <7, glucose 705, BUN 23, creatinine 1.47, wbc 16.1, vbg pH 7.08/pCO2 <19.0, UA revealed glucose >=500, and COVID-19 negative.  Lab results ruled pt in for DKA, therefore insulin gtt initiated and pt received 2L NS bolus.  She was subsequently admitted to the stepdown unit by hospitalist team for additional workup and treatment.    PAST MEDICAL HISTORY :   has a past medical history of Asthma, Clostridium difficile colitis, Diabetes mellitus without complication ([place]), Hypertension, and Mental disorder.  has a past surgical history that includes Abdominal hysterectomy. Prior to Admission medications   Medication Sig Start Date End Date Taking? Authorizing Provider  albuterol (PROVENTIL HFA;VENTOLIN HFA) 108 (90 Base) MCG/ACT inhaler Inhale 2 puffs into the lungs every 6 (six) hours as needed for wheezing or  shortness of breath. 02/18/18  Yes Dustin Flock, MD  buPROPion [facility] SR) 150 MG 12 hr tablet Take 150 mg by mouth 2 (two) times daily.   Yes [provider]  diltiazem (CARDIZEM CD) 120 MG 24 hr capsule Take 1 capsule (120 mg total) by mouth 2 (two) times a day. 01/06/19  Yes Gouru, Illene Silver, MD  folic acid (FOLVITE) 1 MG tablet Take 1 tablet (1 mg total) by mouth daily. 03/08/18  Yes Gouru, Illene Silver, MD  gabapentin (NEURONTIN) 100 MG capsule Take 1 capsule (100 mg total) by mouth 2 (two) times daily. 01/06/19  Yes Gouru, Illene Silver, MD  glipiZIDE (GLUCOTROL XL) 5 MG 24 hr tablet Take 1 tablet (5 mg total) by mouth daily with breakfast. 01/06/19 01/07/20 Yes Gouru, Aruna, MD  Insulin Glargine (LANTUS) 100 UNIT/ML Solostar Pen Inject 30 Units into the skin daily. 01/06/19  Yes Gouru, Illene Silver, MD  lisinopril (PRINIVIL) 10 MG tablet Take 1 tablet (10 mg total) by mouth daily. 02/18/18  Yes Dustin Flock, MD  metFORMIN (GLUCOPHAGE) 500 MG tablet Take 1 tablet (500 mg total) by mouth 2 (two) times daily with a meal. 01/06/19  Yes Gouru, Aruna, MD  NOVOLOG FLEXPEN 100 UNIT/ML FlexPen Inject 5 Units into the skin 3 (three) times daily with meals. 01/06/19  Yes [provider]  QUEtiapine (SEROQUEL) 100 MG tablet Take 1 tablet (100 mg total) by mouth at bedtime. 01/06/19  Yes Gouru, Illene Silver, MD  QUEtiapine (SEROQUEL) 25 MG tablet Take 25 mg by mouth every 4 (four) hours as needed. 03/02/18  Yes [provider]  aspirin EC 81 MG tablet Take 81 mg by mouth  daily.    [provider]  blood glucose meter kit and supplies KIT Dispense based on patient and insurance preference. Use up to four times daily as directed. (FOR ICD-9 250.00, 250.01). 01/06/19   Gouru, Aruna, MD  Insulin Pen Needle 32G X 5 MM MISC 30 Units by Does not apply route daily. 01/06/19   Gouru, Illene Silver, MD  ipratropium-albuterol (DUONEB) 0.5-2.5 (3) MG/3ML SOLN Take 3 mLs by nebulization every 6 (six) hours as needed for  wheezing, shortness of breath or cough. 02/23/18   [provider]  Multiple Vitamin (MULTIVITAMIN WITH MINERALS) TABS tablet Take 1 tablet by mouth daily. 03/08/18   Gouru, Illene Silver, MD  senna-docusate (SENOKOT-S) 8.6-50 MG tablet Take 1 tablet by mouth at bedtime as needed for mild constipation. 03/07/18   Nicholes Mango, MD  thiamine 100 MG tablet Take 1 tablet (100 mg total) by mouth daily. 03/08/18   Nicholes Mango, MD   No Known Allergies  FAMILY HISTORY:  family history includes Hypertension in her mother. SOCIAL HISTORY:  reports that she has been smoking cigarettes. She has been smoking about 0.10 packs per day. She has never used smokeless tobacco. She reports current alcohol use of about 40.0 standard drinks of alcohol per week. She reports current drug use. Drugs: Marijuana and Cocaine.  REVIEW OF SYSTEMS: Positives in BOLD  Constitutional: generalized body aches fever, chills, weight loss, malaise/fatigue and diaphoresis.  HENT: Negative for hearing loss, ear pain, nosebleeds, congestion, sore throat, neck pain, tinnitus and ear discharge.   Eyes: Negative for blurred vision, double vision, photophobia, pain, discharge and redness.  Respiratory: Negative for cough, hemoptysis, sputum production, shortness of breath, wheezing and stridor.   Cardiovascular: Negative for chest pain, palpitations, orthopnea, claudication, leg swelling and PND.  Gastrointestinal: heartburn, nausea, vomiting, abdominal pain, diarrhea, constipation, blood in stool and melena.  Genitourinary: Negative for dysuria, urgency, frequency, hematuria and flank pain.  Musculoskeletal: Negative for myalgias, back pain, joint pain and falls.  Skin: Negative for itching and rash.  Neurological: Negative for dizziness, tingling, tremors, sensory change, speech change, focal weakness, seizures, loss of consciousness, weakness and headaches.  Endo/Heme/Allergies: hyperglycemia, environmental allergies and polydipsia.  Does not bruise/bleed easily.  SUBJECTIVE:  No complaints at this time   VITAL SIGNS: Temp:  [97.5 F (36.4 C)-97.9 F (36.6 C)] 97.9 F (36.6 C) (07/08 1630) Pulse Rate:  [70-137] 114 (07/08 1800) Resp:  [17-27] 23 (07/08 1800) BP: (102-167)/(64-120) 127/80 (07/08 1630) SpO2:  [98 %-100 %] 100 % (07/08 1800) Weight:  [86.5 kg-99.8 kg] 86.5 kg (07/08 1630)  PHYSICAL EXAMINATION: General: chronically ill appearing female, NAD  Neuro: alert and oriented, follows commands HEENT: supple, no JVD  Cardiovascular: sinus tachycardia, no R/G  Lungs: clear throughout, even, non labored  Abdomen: +BS x4, soft, non tender, non distended  Musculoskeletal: normal bulk and tone, no edema  Skin: intact no rashes or lesions present   Recent Labs  Lab 01/17/19 1111 01/17/19 1717  NA 129* 134*  K 5.0 4.1  CL 99 114*  CO2 <7* 8*  BUN 23* 17  CREATININE 1.47* 1.14*  GLUCOSE 705* 298*   Recent Labs  Lab 01/17/19 1111  HGB 12.8  HCT 39.9  WBC 16.1*  PLT 592*   Dg Chest Portable 1 View  Result Date: 01/17/2019 CLINICAL DATA:  Status post line placement EXAM: PORTABLE CHEST 1 VIEW COMPARISON:  December 29, 2018 FINDINGS: The heart size and mediastinal contours are within normal limits. Right central venous line is  identified with distal tip in the superior vena cava. There is no pneumothorax. Both lungs are clear. The visualized skeletal structures are unremarkable. IMPRESSION: No active cardiopulmonary disease. Right central venous line is identified with distal tip in the superior vena cava. No pneumothorax. Electronically Signed   By: Abelardo Diesel M.D.   On: 01/17/2019 13:38    ASSESSMENT / PLAN:  Diabetic ketoacidosis  Continue insulin gtt until anion gap closed and serum CO2 >20 CBG q1hr and BMP q4hrs while on insulin gtt  Diabetes coordinator consulted appreciate input   Acute renal failure likely secondary to dehydration in setting of DKA Hypochloridemia Trend BMP Replace  electrolytes as indicated  Monitor UOP  Avoid nephrotoxic medications    Leukocytosis no obvious signs of infection  Trend WBC and monitor fever curve  Will check pct if elevated will start empiric abx  Follow cultures  If pt develops diarrhea will check for Cdiff   ETOH Abuse  Current everyday smoker  CIWA protocol initiated-ativan per protocol  Smoking and ETOH abuse cessation counseling provided  Psychiatry consulted appreciate input-psych meds per recommendations   VTE px: subq lovenox   Marda Stalker, [place] Pager [phone] (please enter 7 digits) PCCM Consult Pager [phone] (please enter 7 digits)

## 2019-01-17 NOTE — Progress Notes (Signed)
Inpatient Diabetes Program Recommendations  AACE/ADA: New Consensus Statement on Inpatient Glycemic Control   Target Ranges:  Prepandial:   less than 140 mg/dL      Peak postprandial:   less than 180 mg/dL (1-2 hours)      Critically ill patients:  140 - 180 mg/dL   Results for Pamela Lane, Pamela Lane (MRN [id number]) as of 01/17/2019 13:26  Ref. Range 01/17/2019 11:11  Glucose Latest Ref Range: 70 - 99 mg/dL [facility])  Results for Pamela Lane, Pamela Lane (MRN [id number]) as of 01/17/2019 13:26  Ref. Range 12/26/2018 12:30  Hemoglobin A1C Latest Ref Range: 4.8 - 5.6 % 14.2 (H)   Review of Glycemic Control  Diabetes history: DM2 Outpatient Diabetes medications: Lantus 30 units daily, Novolog 5 units TID with meals,Metformin500 mg BID, Glipizide XL 5 mg QAM Current orders for Inpatient glycemic control: IV insulin   NOTE: Noted patient is currently in the Emergency Room at this time and IV insulin has been ordered. Patient was recently inpatient from 01/02/19 to 01/06/19 and seen by the Inpatient Diabetes team on 01/03/19 (see note for details). Patient had reported at that time that she "had not taken any DM meds lately because she is out of medications." Diabetes Coordinator had requested that new prescriptions be given for all DM medications at time of discharge on 01/06/19. On 01/06/19, patient was discharged on Lantus 30 units daily, Novolog 5 units TID with meals, Metformin 500 mg BID, and Glipizide XL 5 mg daily.   On 01/06/19 (prior to discharge) was ordered Lantus 30 units daily, Novolog 5 units TID with meals, Novolog 0-15 units TID with meals, and Novolog 0-5 units QHS.   Thanks, Barnie Alderman, RN, MSN, CDE Diabetes Coordinator Inpatient Diabetes Program [phone] (Team Pager from 8am to 5pm)

## 2019-01-17 NOTE — ED Notes (Signed)
Pt was given a mask, pt refuses to wear the mask.

## 2019-01-17 NOTE — Progress Notes (Signed)
PIV not needed. MD already placed a central line.

## 2019-01-17 NOTE — ED Notes (Signed)
Dr Robinson at bedside 

## 2019-01-17 NOTE — ED Notes (Signed)
Pt lying in bed speaking with this RN in NAD, a&oX4, pt refuses to wear a mask at this time.

## 2019-01-17 NOTE — ED Notes (Signed)
Attempted IV start x2 by this RN and x 2 by Annie Main, RN, IV consult placed

## 2019-01-17 NOTE — H&P (Signed)
[facility] [place] NAME: Pamela Lane    MR#:  [id number]  DATE OF BIRTH:  04/20/1961  DATE OF ADMISSION:  01/17/2019  PRIMARY CARE PHYSICIAN: [facility] [place]   REQUESTING/REFERRING PHYSICIAN: Dr Merlyn Lot  CHIEF COMPLAINT:   Chief Complaint  Patient presents with  . Hyperglycemia  . Nausea    HISTORY OF PRESENT ILLNESS:  Pamela Lane  is a 58 y.o. female with a known history of repeated admissions with diabetic ketoacidosis.  She states that she has been taking her insulin.  She presents with nausea vomiting and abdominal pain.  She states she is not having diarrhea.  Last hospitalization she was diagnosed with C. difficile colitis.  I asked her if she took her vancomycin and she is did not know what I was talking about.  She complains of numbness in her right hand and left leg.  Patient found in the ER to be in diabetic ketoacidosis and hospitalist services contacted for further evaluation.  PAST MEDICAL HISTORY:   Past Medical History:  Diagnosis Date  . Asthma   . Clostridium difficile colitis   . Diabetes mellitus without complication ([place])   . Hypertension   . Mental disorder    pt reports 'I have all of them mental disorders'    PAST SURGICAL HISTORY:   Past Surgical History:  Procedure Laterality Date  . ABDOMINAL HYSTERECTOMY      SOCIAL HISTORY:   Social History   Tobacco Use  . Smoking status: Current Every Day Smoker    Packs/day: 0.10    Types: Cigarettes  . Smokeless tobacco: Never Used  Substance Use Topics  . Alcohol use: Yes    Alcohol/week: 40.0 standard drinks    Types: 40 Cans of beer per week    Comment: 3 big bottles of liquor    FAMILY HISTORY:   Family History  Problem Relation Age of Onset  . Hypertension Mother     DRUG ALLERGIES:  No Known Allergies  REVIEW OF SYSTEMS:  CONSTITUTIONAL: No fever.  Positive for chills.  Positive for  fatigue and weakness.  EYES: Positive for blurred vision EARS, NOSE, AND THROAT: No tinnitus or ear pain. No sore throat RESPIRATORY: Positive for cough, and shortness of breath.no wheezing or hemoptysis.  CARDIOVASCULAR: Positive for chest pain.no orthopnea, edema.  GASTROINTESTINAL: Positive for nausea, vomiting, and abdominal pain. No blood in bowel movements.  No diarrhea. GENITOURINARY: No dysuria, hematuria.  ENDOCRINE: No polyuria, nocturia,  HEMATOLOGY: No anemia, easy bruising or bleeding SKIN: No rash or lesion. MUSCULOSKELETAL: No joint pain or arthritis.   NEUROLOGIC: Numbness right arm and left leg PSYCHIATRY: History of mental disorder  MEDICATIONS AT HOME:   Prior to Admission medications   Medication Sig Start Date End Date Taking? Authorizing Provider  albuterol (PROVENTIL HFA;VENTOLIN HFA) 108 (90 Base) MCG/ACT inhaler Inhale 2 puffs into the lungs every 6 (six) hours as needed for wheezing or shortness of breath. 02/18/18  Yes Dustin Flock, MD  buPROPion [facility] SR) 150 MG 12 hr tablet Take 150 mg by mouth 2 (two) times daily.   Yes [provider]  diltiazem (CARDIZEM CD) 120 MG 24 hr capsule Take 1 capsule (120 mg total) by mouth 2 (two) times a day. 01/06/19  Yes Gouru, Illene Silver, MD  folic acid (FOLVITE) 1 MG tablet Take 1 tablet (1 mg total) by mouth daily. 03/08/18  Yes Gouru, Illene Silver, MD  gabapentin (NEURONTIN) 100 MG capsule  Take 1 capsule (100 mg total) by mouth 2 (two) times daily. 01/06/19  Yes Gouru, Illene Silver, MD  glipiZIDE (GLUCOTROL XL) 5 MG 24 hr tablet Take 1 tablet (5 mg total) by mouth daily with breakfast. 01/06/19 01/07/20 Yes Gouru, Aruna, MD  Insulin Glargine (LANTUS) 100 UNIT/ML Solostar Pen Inject 30 Units into the skin daily. 01/06/19  Yes Gouru, Illene Silver, MD  lisinopril (PRINIVIL) 10 MG tablet Take 1 tablet (10 mg total) by mouth daily. 02/18/18  Yes Dustin Flock, MD  metFORMIN (GLUCOPHAGE) 500 MG tablet Take 1 tablet (500 mg total) by mouth 2  (two) times daily with a meal. 01/06/19  Yes Gouru, Aruna, MD  NOVOLOG FLEXPEN 100 UNIT/ML FlexPen Inject 5 Units into the skin 3 (three) times daily with meals. 01/06/19  Yes [provider]  QUEtiapine (SEROQUEL) 100 MG tablet Take 1 tablet (100 mg total) by mouth at bedtime. 01/06/19  Yes Gouru, Illene Silver, MD  QUEtiapine (SEROQUEL) 25 MG tablet Take 25 mg by mouth every 4 (four) hours as needed. 03/02/18  Yes [provider]  aspirin EC 81 MG tablet Take 81 mg by mouth daily.    [provider]  blood glucose meter kit and supplies KIT Dispense based on patient and insurance preference. Use up to four times daily as directed. (FOR ICD-9 250.00, 250.01). 01/06/19   Gouru, Aruna, MD  Insulin Pen Needle 32G X 5 MM MISC 30 Units by Does not apply route daily. 01/06/19   Gouru, Illene Silver, MD  ipratropium-albuterol (DUONEB) 0.5-2.5 (3) MG/3ML SOLN Take 3 mLs by nebulization every 6 (six) hours as needed for wheezing, shortness of breath or cough. 02/23/18   [provider]  Multiple Vitamin (MULTIVITAMIN WITH MINERALS) TABS tablet Take 1 tablet by mouth daily. 03/08/18   Gouru, Illene Silver, MD  senna-docusate (SENOKOT-S) 8.6-50 MG tablet Take 1 tablet by mouth at bedtime as needed for mild constipation. 03/07/18   Nicholes Mango, MD  thiamine 100 MG tablet Take 1 tablet (100 mg total) by mouth daily. 03/08/18   Nicholes Mango, MD      VITAL SIGNS:  Blood pressure (!) 124/91, pulse (!) 136, temperature (!) 97.5 F (36.4 C), temperature source Oral, resp. rate (!) 24, height '5\' 3"'  (1.6 m), weight 99.8 kg, SpO2 100 %.  PHYSICAL EXAMINATION:  GENERAL:  58 y.o.-year-old patient lying in the bed with no acute distress.  EYES: Pupils equal, round, reactive to light and accommodation. No scleral icterus. Extraocular muscles intact.  HEENT: Head atraumatic, normocephalic. Oropharynx and nasopharynx clear.  NECK:  Supple, no jugular venous distention. No thyroid enlargement, no tenderness.  LUNGS:  Decreased breath sounds bilaterally, no wheezing, rales,rhonchi or crepitation. No use of accessory muscles of respiration.  CARDIOVASCULAR: S1, S2 tachycardic. No murmurs, rubs, or gallops.  ABDOMEN: Soft, slight epigastric tenderness, nondistended. Bowel sounds present. No organomegaly or mass.  EXTREMITIES: No pedal edema, cyanosis, or clubbing.  NEUROLOGIC: Cranial nerves II through XII are intact. Muscle strength 5/5 in all extremities. Sensation intact. Gait not checked.  PSYCHIATRIC: The patient is alert and oriented x 3.  SKIN: No rash, lesion, or ulcer.   LABORATORY PANEL:   CBC Recent Labs  Lab 01/17/19 1111  WBC 16.1*  HGB 12.8  HCT 39.9  PLT 592*   ------------------------------------------------------------------------------------------------------------------  Chemistries  Recent Labs  Lab 01/17/19 1111  NA 129*  K 5.0  CL 99  CO2 <7*  GLUCOSE 705*  BUN 23*  CREATININE 1.47*  CALCIUM 9.4  AST 10*  ALT  11  ALKPHOS 118  BILITOT 1.4*   ------------------------------------------------------------------------------------------------------------------   RADIOLOGY:  Dg Chest Portable 1 View  Result Date: 01/17/2019 CLINICAL DATA:  Status post line placement EXAM: PORTABLE CHEST 1 VIEW COMPARISON:  December 29, 2018 FINDINGS: The heart size and mediastinal contours are within normal limits. Right central venous line is identified with distal tip in the superior vena cava. There is no pneumothorax. Both lungs are clear. The visualized skeletal structures are unremarkable. IMPRESSION: No active cardiopulmonary disease. Right central venous line is identified with distal tip in the superior vena cava. No pneumothorax. Electronically Signed   By: Abelardo Diesel M.D.   On: 01/17/2019 13:38    EKG:   Sinus tachycardia nonspecific ST-T wave changes  IMPRESSION AND PLAN:   1.  Diabetic ketoacidosis with severe acidosis.  Patient on insulin drip and will be admitted to the  ICU.  This is the third admission in a short period of time will get a palliative care consultation.  Will also get psychiatric consultation.  Case discussed with critical care specialist 2.  Tachycardic patient was placed on Cardizem CD on last admission.  ER physician placed on Cardizem drip until she is able to take oral medications. 3.  Shaking chills.  Would rather hold off on antibiotics if all possible urine analysis negative obtain blood cultures.  Patient had a recent C. difficile colitis and unsure if she took her medications completely. 4.  Asthma.  Respiratory status stable.  PRN inhaler. 5.  History of mental disorder on Seroquel.  We will get psychiatric consultation because of repeated hospitalizations. 6.  Diabetic neuropathy on gabapentin  All the records are reviewed and case discussed with ED provider. Management plans discussed with the patient, and she is in agreement.  CODE STATUS: Full code  TOTAL TIME TAKING CARE OF THIS PATIENT: 50 minutes.    Loletha Grayer M.D on 01/17/2019 at 4:22 PM  Between 7am to 6pm - Pager - [phone]  After 6pm call admission pager [phone]  [facility] Office  [phone]  CC: Primary care physician; [facility] [place]

## 2019-01-17 NOTE — TOC Initial Note (Signed)
Transition of Care [facility]) - Initial/Assessment Note    Patient Details  Name: Pamela Lane MRN: [id number] Date of Birth: [birth year]  Transition of Care [facility]) CM/SW Contact:    Marshell Garfinkel, RN Phone Number: 01/17/2019, 1:38 PM  Clinical Narrative:                  Readmission. Patient shared per last TOC notes that she did not have any problems obtaining medication. She uses [employer] for transportation. She lives with a friend. She obtained a rolling walker through [employer] at last hospitalization. Per Nicole Kindred with [facility] they last saw her on 12/16/18; lantus, lisinopril and Pro air was given to patient on 01/02/19. On 01/01/19 they filled Cardizem, Cesuroxime axetil sent from [facility] per Nicole Kindred.        Patient Goals and CMS Choice        Expected Discharge Plan and Services                                                Prior Living Arrangements/Services                       Activities of Daily Living      Permission Sought/Granted                  Emotional Assessment              Admission diagnosis:  high blood sugar Patient Active Problem List   Diagnosis Date Noted  . Acute pancreatitis 05/15/2016  . AKI (acute kidney injury) ([place])   . Diabetic ketoacidosis without coma associated with type 2 diabetes mellitus ([place])   . Hyperglycemia due to type 2 diabetes mellitus ([place]) 01/13/2016  . UTI (urinary tract infection) 01/13/2016  . DKA (diabetic ketoacidoses) ([place]) 01/08/2016  . Hyponatremia 01/08/2016  . Dehydration 01/08/2016  . Urinary tract infection 01/08/2016  . Upper abdominal pain 01/08/2016  . Nausea & vomiting 01/08/2016   PCP:  [facility] [place]:   [place], [address] [place] [address] Phone: [phone] Fax: [place] [address] ([address]), [place] - [place]) [place] [address] Phone: [phone] Fax: [phone]     Social Determinants of Health (SDOH) Interventions    Readmission Risk Interventions No flowsheet data found.

## 2019-01-17 NOTE — ED Notes (Signed)
ED TO INPATIENT HANDOFF REPORT  ED Nurse Name and Phone #: Janett Billow 36   S Name/Age/Gender Pamela Lane 58 y.o. female Room/Bed: ED09A/ED09A  Code Status   Code Status: Full Code  Home/SNF/Other Home Patient oriented to: self, place, time and situation Is this baseline? Yes   Triage Complete: Triage complete  Chief Complaint high blood sugar  Triage Note Pt in via [facility] with c/o high blood sugar, nausea, and pain over her body. EMS reports per pt she was recently d/[facility] from [facility] where she had been admitted for a diabetic coma. Pt with hx of HTN, diabetes and pancreatic cancer. Pt admits to being non compliant with her meds because noone has told her what they are for. BP 115/80, 120 HR, 97.6temp, FSBS reading high. EMS administered 4mg  zofran IM en route.    Allergies No Known Allergies  Level of Care/Admitting Diagnosis ED Disposition    ED Disposition Condition [facility] Area: [place] [[id number]]  Level of Care: Stepdown [14]  Covid Evaluation: Confirmed COVID Negative  Diagnosis: DKA (diabetic ketoacidoses) [facility]) [[id number]]  Admitting Physician: Loletha Grayer [[id number]]  Attending Physician: Loletha Grayer [phone]  Estimated length of stay: past midnight tomorrow  Certification:: I certify this patient will need inpatient services for at least 2 midnights  PT Class (Do Not Modify): Inpatient [101]  PT Acc Code (Do Not Modify): Private [1]       B Medical/Surgery History Past Medical History:  Diagnosis Date  . Asthma   . Clostridium difficile colitis   . Diabetes mellitus without complication ([place])   . Hypertension   . Mental disorder    pt reports 'I have all of them mental disorders'   Past Surgical History:  Procedure Laterality Date  . ABDOMINAL HYSTERECTOMY       A IV Location/Drains/Wounds Patient Lines/Drains/Airways Status   Active Line/Drains/Airways    Name:   Placement date:   Placement time:    Site:   Days:   CVC Triple Lumen 01/17/19 Right   01/17/19    1230     less than 1          Intake/Output Last 24 hours No intake or output data in the 24 hours ending 01/17/19 1622  Labs/Imaging Results for orders placed or performed during the hospital encounter of 01/17/19 (from the past 48 hour(s))  CBC with Differential/Platelet     Status: Abnormal   Collection Time: 01/17/19 11:11 AM  Result Value Ref Range   WBC 16.1 (H) 4.0 - 10.5 K/uL   RBC 4.21 3.87 - 5.11 MIL/uL   Hemoglobin 12.8 12.0 - 15.0 g/dL   HCT 39.9 36.0 - 46.0 %   MCV 94.8 80.0 - 100.0 fL   MCH 30.4 26.0 - 34.0 pg   MCHC 32.1 30.0 - 36.0 g/dL   RDW 13.2 11.5 - 15.5 %   Platelets 592 (H) 150 - 400 K/uL   nRBC 0.0 0.0 - 0.2 %   Neutrophils Relative % 86 %   Neutro Abs 13.8 (H) 1.7 - 7.7 K/uL   Lymphocytes Relative 7 %   Lymphs Abs 1.1 0.7 - 4.0 K/uL   Monocytes Relative 6 %   Monocytes Absolute 1.0 0.1 - 1.0 K/uL   Eosinophils Relative 0 %   Eosinophils Absolute 0.0 0.0 - 0.5 K/uL   Basophils Relative 0 %   Basophils Absolute 0.1 0.0 - 0.1 K/uL   Immature Granulocytes 1 %   Abs  Immature Granulocytes 0.12 (H) 0.00 - 0.07 K/uL    Comment: Performed at [facility], [address] [place], [place] [address]  Comprehensive metabolic panel     Status: Abnormal   Collection Time: 01/17/19 11:11 AM  Result Value Ref Range   Sodium 129 (L) 135 - 145 mmol/L   Potassium 5.0 3.5 - 5.1 mmol/L   Chloride 99 98 - 111 mmol/L   CO2 <7 (L) 22 - 32 mmol/L   Glucose, Bld 705 (HH) 70 - 99 mg/dL    Comment: CRITICAL RESULT CALLED TO, READ BACK BY AND VERIFIED WITH Aireana Ryland RN AT 1320 ON 01/17/2019 SNG    BUN 23 (H) 6 - 20 mg/dL   Creatinine, Ser 6.041.47 (H) 0.44 - 1.00 mg/dL   Calcium 9.4 8.9 - 54.010.3 mg/dL   Total Protein 8.4 (H) 6.5 - 8.1 g/dL   Albumin 4.0 3.5 - 5.0 g/dL   AST 10 (L) 15 - 41 U/L   ALT 11 0 - 44 U/L   Alkaline Phosphatase 118 38 - 126 U/L   Total Bilirubin 1.4 (H) 0.3 - 1.2 mg/dL    GFR calc non Af Amer 39 (L) >60 mL/min   GFR calc Af Amer 45 (L) >60 mL/min    Comment: Performed at [facility], [address] [place], [place] [address]  Urinalysis, Complete w Microscopic     Status: Abnormal   Collection Time: 01/17/19 11:12 AM  Result Value Ref Range   Color, Urine STRAW (A) YELLOW   APPearance CLEAR (A) CLEAR   Specific Gravity, Urine 1.018 1.005 - 1.030   pH 5.0 5.0 - 8.0   Glucose, UA >=500 (A) NEGATIVE mg/dL   Hgb urine dipstick SMALL (A) NEGATIVE   Bilirubin Urine NEGATIVE NEGATIVE   Ketones, ur 80 (A) NEGATIVE mg/dL   Protein, ur 30 (A) NEGATIVE mg/dL   Nitrite NEGATIVE NEGATIVE   Leukocytes,Ua NEGATIVE NEGATIVE   RBC / HPF 0-5 0 - 5 RBC/hpf   WBC, UA 0-5 0 - 5 WBC/hpf   Bacteria, UA NONE SEEN NONE SEEN   Squamous Epithelial / LPF 0-5 0 - 5   Mucus PRESENT     Comment: Performed at [facility], [address] [place], [place] [address]  Blood gas, venous     Status: Abnormal   Collection Time: 01/17/19 11:12 AM  Result Value Ref Range   pH, Ven 7.08 (LL) 7.250 - 7.430    Comment: CRITICAL RESULT CALLED TO, READ BACK BY AND VERIFIED WITH: CRITICAL VALUE 01/17/19,DR. ROBINSON/FD    pCO2, Ven <19.0 (LL) 44.0 - 60.0 mmHg    Comment: CRITICAL RESULT CALLED TO, READ BACK BY AND VERIFIED WITH: CRITICAL VALUE 01/17/19,DR. ROBINSON/FD    pO2, Ven 57.0 (H) 32.0 - 45.0 mmHg   Bicarbonate 4.4 (L) 20.0 - 28.0 mmol/L   Acid-base deficit 23.5 (H) 0.0 - 2.0 mmol/L   O2 Saturation 74.7 %   Patient temperature 37.0    Collection site VEIN    Sample type VEIN     Comment: Performed at [facility], [address] [place], [place] [address]  Glucose, capillary     Status: Abnormal   Collection Time: 01/17/19 12:09 PM  Result Value Ref Range   Glucose-Capillary 572 (HH) 70 - 99 mg/dL  SARS Coronavirus 2 (CEPHEID- Performed in [facility] hospital lab), Hosp Order     Status: None   Collection Time: 01/17/19 12:38 PM   Specimen:  Nasopharyngeal Swab  Result Value Ref Range   SARS Coronavirus 2 NEGATIVE NEGATIVE    Comment: (NOTE) If result is NEGATIVE SARS-CoV-2 target nucleic acids are NOT DETECTED. The SARS-CoV-2 RNA is generally detectable in upper and lower  respiratory specimens during the acute phase of infection. The lowest  concentration of SARS-CoV-2 viral copies this assay can detect is 250  copies / mL. A negative result does not preclude SARS-CoV-2 infection  and should not be used as the sole basis for treatment or other  patient management decisions.  A negative result may occur with  improper specimen collection / handling, submission of specimen other  than nasopharyngeal swab, presence of viral mutation(s) within the  areas targeted by this assay, and inadequate number of viral copies  (<250 copies / mL). A negative result must be combined with clinical  observations, patient history, and epidemiological information. If result is POSITIVE SARS-CoV-2 target nucleic acids are DETECTED. The SARS-CoV-2 RNA is generally detectable in upper and lower  respiratory specimens dur ing the acute phase of infection.  Positive  results are indicative of active infection with SARS-CoV-2.  Clinical  correlation with patient history and other diagnostic information is  necessary to determine patient infection status.  Positive results do  not rule out bacterial infection or co-infection with other viruses. If result is PRESUMPTIVE POSTIVE SARS-CoV-2 nucleic acids MAY BE PRESENT.   A presumptive positive result was obtained on the submitted specimen  and confirmed on repeat testing.  While 2019 novel coronavirus  (SARS-CoV-2) nucleic acids may be present in the submitted sample  additional confirmatory testing may be necessary for epidemiological  and / or clinical management purposes  to differentiate between  SARS-CoV-2 and other Sarbecovirus currently known to infect humans.  If clinically indicated  additional testing with an alternate test  methodology [phone] is advised. The SARS-CoV-2 RNA is generally  detectable in upper and lower respiratory sp ecimens during the acute  phase of infection. The expected result is Negative. Fact Sheet for Patients:  BoilerBrush.com.cyhttps://www.fda.gov/media/136312/download Fact Sheet for Healthcare Providers: https://pope.com/https://www.fda.gov/media/136313/download This test is not yet approved or cleared by the [place] FDA and has been authorized for detection and/or diagnosis of SARS-CoV-2 by FDA under an Emergency Use Authorization (EUA).  This EUA will remain in effect (meaning this test can be used) for the duration of the COVID-19 declaration under Section 564(b)(1) of the Act, 21 U.S.C. section 360bbb-3(b)(1), unless the authorization is terminated or revoked sooner. Performed at [facility], [address] [place], [place] [address]   Glucose, capillary     Status: Abnormal   Collection Time: 01/17/19  1:38 PM  Result Value Ref Range   Glucose-Capillary >600 (HH) 70 - 99 mg/dL  Glucose, capillary     Status: Abnormal   Collection Time: 01/17/19  2:39 PM  Result Value Ref Range   Glucose-Capillary 510 (HH) 70 - 99 mg/dL  Glucose, capillary     Status: Abnormal   Collection Time: 01/17/19  4:05 PM  Result Value Ref Range   Glucose-Capillary 359 (H) 70 - 99 mg/dL   Dg Chest Portable 1 View  Result Date: 01/17/2019 CLINICAL DATA:  Status post line placement EXAM: PORTABLE CHEST 1 VIEW COMPARISON:  December 29, 2018 FINDINGS: The heart size and mediastinal contours are within normal limits. Right central venous line is identified with distal tip in the superior vena cava. There is no pneumothorax. Both lungs are clear. The visualized skeletal structures are unremarkable. IMPRESSION: No active cardiopulmonary disease.  Right central venous line is identified with distal tip in the superior vena cava. No pneumothorax. Electronically Signed   By: Sherian ReinWei-Chen   Lin M.D.   On: 01/17/2019 13:38    Pending Labs Unresulted Labs (From admission, onward)    Start     Ordered   01/24/19 0500  Creatinine, serum  (enoxaparin (LOVENOX)    CrCl >/= 30 ml/min)  Weekly,   STAT    Comments: while on enoxaparin therapy    01/17/19 1619   01/18/19 0500  Basic metabolic panel  Tomorrow morning,   STAT     01/17/19 1619   01/18/19 0500  CBC  Tomorrow morning,   STAT     01/17/19 1619   01/17/19 1621  CULTURE, BLOOD (ROUTINE X 2) w Reflex to ID Panel  BLOOD CULTURE X 2,   STAT     01/17/19 1620   01/17/19 1620  Creatinine, serum  (enoxaparin (LOVENOX)    CrCl >/= 30 ml/min)  Once,   STAT    Comments: Baseline for enoxaparin therapy IF NOT ALREADY DRAWN.    01/17/19 1619          Vitals/Pain Today's Vitals   01/17/19 1345 01/17/19 1400 01/17/19 1415 01/17/19 1430  BP:  (!) 121/95  (!) 124/91  Pulse:    (!) 136  Resp: 19 (!) 27 (!) 22 (!) 24  Temp:      TempSrc:      SpO2:    100%  Weight:      Height:      PainSc:        Isolation Precautions No active isolations  Medications Medications  insulin regular bolus via infusion 0-10 Units (has no administration in time range)  insulin regular, human (MYXREDLIN) 100 units/ 100 mL infusion (3 Units/hr Intravenous Rate/Dose Change 01/17/19 1607)  dextrose 50 % solution 25 mL (has no administration in time range)  dextrose 5 %-0.45 % sodium chloride infusion (has no administration in time range)  0.9 %  sodium chloride infusion (has no administration in time range)  diltiazem (CARDIZEM CD) 24 hr capsule 120 mg (has no administration in time range)  diltiazem (CARDIZEM) 100 mg in dextrose 5 % 100 mL (1 mg/mL) infusion (7.5 mg/hr Intravenous Rate/Dose Change 01/17/19 1608)  diltiazem (CARDIZEM CD) 24 hr capsule 120 mg (has no administration in time range)  enoxaparin (LOVENOX) injection 40 mg (has no administration in time range)  acetaminophen (TYLENOL) tablet 650 mg (has no administration in time range)     Or  acetaminophen (TYLENOL) suppository 650 mg (has no administration in time range)  sodium chloride 0.9 % bolus 1,000 mL (1,000 mLs Intravenous New Bag/Given 01/17/19 1329)  lidocaine (PF) (XYLOCAINE) 1 % injection 5 mL (5 mLs Intradermal Given 01/17/19 1301)  prochlorperazine (COMPAZINE) injection 10 mg (10 mg Intravenous Given 01/17/19 1301)  diphenhydrAMINE (BENADRYL) injection 12.5 mg (12.5 mg Intravenous Given 01/17/19 1300)  sodium chloride 0.9 % bolus 1,000 mL (1,000 mLs Intravenous New Bag/Given 01/17/19 1414)  insulin aspart (novoLOG) injection 10 Units (10 Units Intravenous Given 01/17/19 1337)    Mobility walks Low fall risk   Focused Assessments 1   R Recommendations: See Admitting Provider Note  Report given to:   Additional Notes:

## 2019-01-18 ENCOUNTER — Other Ambulatory Visit: Payer: Self-pay

## 2019-01-18 DIAGNOSIS — F3130 Bipolar disorder, current episode depressed, mild or moderate severity, unspecified: Secondary | ICD-10-CM

## 2019-01-18 DIAGNOSIS — Z9119 Patient's noncompliance with other medical treatment and regimen: Secondary | ICD-10-CM

## 2019-01-18 LAB — BASIC METABOLIC PANEL
Anion gap: 8 (ref 5–15)
Anion gap: 9 (ref 5–15)
Anion gap: 9 (ref 5–15)
BUN: 14 mg/dL (ref 6–20)
BUN: 14 mg/dL (ref 6–20)
BUN: 12 mg/dL (ref 6–20)
CO2: 15 mmol/L — ABNORMAL LOW (ref 22–32)
CO2: 15 mmol/L — ABNORMAL LOW (ref 22–32)
CO2: 15 mmol/L — ABNORMAL LOW (ref 22–32)
Calcium: 8.5 mg/dL — ABNORMAL LOW (ref 8.9–10.3)
Calcium: 8.5 mg/dL — ABNORMAL LOW (ref 8.9–10.3)
Calcium: 8.6 mg/dL — ABNORMAL LOW (ref 8.9–10.3)
Chloride: 115 mmol/L — ABNORMAL HIGH (ref 98–111)
Chloride: 115 mmol/L — ABNORMAL HIGH (ref 98–111)
Chloride: 113 mmol/L — ABNORMAL HIGH (ref 98–111)
Creatinine, Ser: 0.82 mg/dL (ref 0.44–1.00)
Creatinine, Ser: 0.75 mg/dL (ref 0.44–1.00)
Creatinine, Ser: 0.72 mg/dL (ref 0.44–1.00)
GFR calc Af Amer: >60 mL/min (ref >60)
GFR calc Af Amer: >60 mL/min (ref >60)
GFR calc Af Amer: >60 mL/min (ref >60)
GFR calc non Af Amer: >60 mL/min (ref >60)
GFR calc non Af Amer: >60 mL/min (ref >60)
GFR calc non Af Amer: >60 mL/min (ref >60)
Glucose, Bld: 162 mg/dL — ABNORMAL HIGH (ref 70–99)
Glucose, Bld: 157 mg/dL — ABNORMAL HIGH (ref 70–99)
Glucose, Bld: 173 mg/dL — ABNORMAL HIGH (ref 70–99)
Potassium: 3.2 mmol/L — ABNORMAL LOW (ref 3.5–5.1)
Potassium: 3.3 mmol/L — ABNORMAL LOW (ref 3.5–5.1)
Potassium: 3.7 mmol/L (ref 3.5–5.1)
Sodium: 138 mmol/L (ref 135–145)
Sodium: 139 mmol/L (ref 135–145)
Sodium: 137 mmol/L (ref 135–145)

## 2019-01-18 LAB — CBC
HCT: 30.9 % — ABNORMAL LOW (ref 36.0–46.0)
Hemoglobin: 10.5 g/dL — ABNORMAL LOW (ref 12.0–15.0)
MCH: 30.6 pg (ref 26.0–34.0)
MCHC: 34 g/dL (ref 30.0–36.0)
MCV: 90.1 fL (ref 80.0–100.0)
Platelets: 383 10*3/uL (ref 150–400)
RBC: 3.43 MIL/uL — ABNORMAL LOW (ref 3.87–5.11)
RDW: 13.2 % (ref 11.5–15.5)
WBC: 7 10*3/uL (ref 4.0–10.5)
nRBC: 0 % (ref 0.0–0.2)

## 2019-01-18 LAB — GLUCOSE, CAPILLARY
Glucose-Capillary: 137 mg/dL — ABNORMAL HIGH (ref 70–99)
Glucose-Capillary: 140 mg/dL — ABNORMAL HIGH (ref 70–99)
Glucose-Capillary: 151 mg/dL — ABNORMAL HIGH (ref 70–99)
Glucose-Capillary: 153 mg/dL — ABNORMAL HIGH (ref 70–99)
Glucose-Capillary: 153 mg/dL — ABNORMAL HIGH (ref 70–99)
Glucose-Capillary: 158 mg/dL — ABNORMAL HIGH (ref 70–99)
Glucose-Capillary: 162 mg/dL — ABNORMAL HIGH (ref 70–99)
Glucose-Capillary: 164 mg/dL — ABNORMAL HIGH (ref 70–99)
Glucose-Capillary: 164 mg/dL — ABNORMAL HIGH (ref 70–99)
Glucose-Capillary: 169 mg/dL — ABNORMAL HIGH (ref 70–99)
Glucose-Capillary: 195 mg/dL — ABNORMAL HIGH (ref 70–99)
Glucose-Capillary: 234 mg/dL — ABNORMAL HIGH (ref 70–99)
Glucose-Capillary: 337 mg/dL — ABNORMAL HIGH (ref 70–99)

## 2019-01-18 LAB — MAGNESIUM: Magnesium: 1.9 mg/dL (ref 1.7–2.4)

## 2019-01-18 LAB — PROCALCITONIN: Procalcitonin: <0.1 ng/mL

## 2019-01-18 MED ORDER — BLOOD GLUCOSE METER KIT: PACK | 0 refills | Status: DC

## 2019-01-18 MED ORDER — INSULIN ASPART 100 UNIT/ML ~~LOC~~ SOLN: 0.0000 [IU] | Freq: Every day | SUBCUTANEOUS | Status: DC

## 2019-01-18 MED ORDER — GLIPIZIDE ER 5 MG PO TB24: 5.0000 mg | ORAL_TABLET | Freq: Every day | ORAL | Status: DC

## 2019-01-18 MED ORDER — LURASIDONE HCL 40 MG PO TABS: 40.0000 mg | ORAL_TABLET | Freq: Every evening | ORAL | 0 refills | Status: DC

## 2019-01-18 MED ORDER — INSULIN GLARGINE 100 UNIT/ML ~~LOC~~ SOLN
30.0000 [IU] | Freq: Every day | SUBCUTANEOUS | Status: DC
  Administered 2019-01-18: 30 [IU] via SUBCUTANEOUS
  Filled 2019-01-18 (×2): qty 0.3

## 2019-01-18 MED ORDER — POTASSIUM CHLORIDE 10 MEQ/50ML IV SOLN
10.0000 meq | INTRAVENOUS | Status: AC
  Administered 2019-01-18 (×4): 10 meq via INTRAVENOUS
  Filled 2019-01-18 (×4): qty 50

## 2019-01-18 MED ORDER — INSULIN ASPART 100 UNIT/ML ~~LOC~~ SOLN
0.0000 [IU] | Freq: Three times a day (TID) | SUBCUTANEOUS | Status: DC
  Administered 2019-01-18: 5 [IU] via SUBCUTANEOUS
  Filled 2019-01-18: qty 1

## 2019-01-18 MED ORDER — POTASSIUM CHLORIDE CRYS ER 20 MEQ PO TBCR
40.0000 meq | EXTENDED_RELEASE_TABLET | Freq: Once | ORAL | Status: AC
  Administered 2019-01-18: 40 meq via ORAL
  Filled 2019-01-18: qty 2

## 2019-01-18 NOTE — Discharge Summary (Signed)
[facility] NAME: Pamela Lane    MR#:  [id number]  DATE OF BIRTH:  [birth year]  DATE OF ADMISSION:  01/17/2019 ADMITTING PHYSICIAN: Loletha Grayer, MD  DATE OF DISCHARGE: January 18, 2019  PRIMARY CARE PHYSICIAN: [facility]    ADMISSION DIAGNOSIS:  Type 1 diabetes mellitus with ketoacidosis without coma ([place]) [E10.10]  DISCHARGE DIAGNOSIS:  Principal Problem:   DKA (diabetic ketoacidoses) ([place]) Active Problems:   Bipolar disorder ([place])   SECONDARY DIAGNOSIS:   Past Medical History:  Diagnosis Date  . Asthma   . Clostridium difficile colitis   . Diabetes mellitus without complication ([place])   . Hypertension   . Mental disorder    pt reports 'I have all of them mental disorders'    HOSPITAL COURSE:   58 year old female with history of noncompliance, diabetes and bipolar disorder who presents to the emergency room with nausea.  1.  DKA: Patient was placed on DKA protocol with insulin drip.  BMP was checked every 6 hours.  DKA has resolved.  She will continue with outpatient regimen of diabetes medications.  She is strongly encouraged to take her medications as prescribed.  2 Acute kidney in the setting of dehydration due to DKA which is resolved  3.Tobacco dependence: Patient is encouraged to quit smoking and willing to attempt to quit was assessed. Patient highly motivated.Counseling was provided for 4 minutes.   4.  Essential hypertension: Continue exam 5.  Bipolar disorder: Patient was eval by psychiatry.  Recommendation are to switch from Seroquel to Latuda as Seroquel was increasing patient's blood sugar.  She will continue on Wellbutrin.  She does not need inpatient psychiatric care.  Outpatient palliative care services for goals of care as patient remains noncompliant. DISCHARGE CONDITIONS AND DIET:   Stable Diabetic diet  CONSULTS OBTAINED:  Treatment Team:  [facility], [facility],  MD  DRUG ALLERGIES:  No Known Allergies  DISCHARGE MEDICATIONS:   Allergies as of 01/18/2019   No Known Allergies     Medication List    STOP taking these medications   QUEtiapine 100 MG tablet Commonly known as: SEROQUEL   QUEtiapine 25 MG tablet Commonly known as: SEROQUEL     TAKE these medications   albuterol 108 (90 Base) MCG/ACT inhaler Commonly known as: VENTOLIN HFA Inhale 2 puffs into the lungs every 6 (six) hours as needed for wheezing or shortness of breath.   aspirin EC 81 MG tablet Take 81 mg by mouth daily.   blood glucose meter kit and supplies Kit Dispense based on patient and insurance preference. Use up to four times daily as directed. (FOR ICD-9 250.00, 250.01).   buPROPion 150 MG 12 hr tablet Commonly known as: WELLBUTRIN SR Take 150 mg by mouth 2 (two) times daily.   diltiazem 120 MG 24 hr capsule Commonly known as: CARDIZEM CD Take 1 capsule (120 mg total) by mouth 2 (two) times a day.   folic acid 1 MG tablet Commonly known as: FOLVITE Take 1 tablet (1 mg total) by mouth daily.   gabapentin 100 MG capsule Commonly known as: NEURONTIN Take 1 capsule (100 mg total) by mouth 2 (two) times daily.   glipiZIDE 5 MG 24 hr tablet Commonly known as: GLUCOTROL XL Take 1 tablet (5 mg total) by mouth daily with breakfast.   Insulin Glargine 100 UNIT/ML Solostar Pen Commonly known as: LANTUS Inject 30 Units into the skin daily.   Insulin Pen Needle 32G  X 5 MM Misc 30 Units by Does not apply route daily.   ipratropium-albuterol 0.5-2.5 (3) MG/3ML Soln Commonly known as: DUONEB Take 3 mLs by nebulization every 6 (six) hours as needed for wheezing, shortness of breath or cough.   lisinopril 10 MG tablet Commonly known as: Prinivil Take 1 tablet (10 mg total) by mouth daily.   lurasidone 40 MG Tabs tablet Commonly known as: LATUDA Take 1 tablet (40 mg total) by mouth every evening.   metFORMIN 500 MG tablet Commonly known as:  GLUCOPHAGE Take 1 tablet (500 mg total) by mouth 2 (two) times daily with a meal.   multivitamin with minerals Tabs tablet Take 1 tablet by mouth daily.   NovoLOG FlexPen 100 UNIT/ML FlexPen Generic drug: insulin aspart Inject 5 Units into the skin 3 (three) times daily with meals.   senna-docusate 8.6-50 MG tablet Commonly known as: Senokot-S Take 1 tablet by mouth at bedtime as needed for mild constipation.   thiamine 100 MG tablet Take 1 tablet (100 mg total) by mouth daily.         Today   CHIEF COMPLAINT:  Patient without n/v tolerated diet well this am   VITAL SIGNS:  Blood pressure 117/74, pulse 97, temperature 98.1 F (36.7 C), resp. rate 17, height '5\' 3"'  (1.6 m), weight 86.5 kg, SpO2 98 %.   REVIEW OF SYSTEMS:  Review of Systems  Constitutional: Negative.  Negative for chills, fever and malaise/fatigue.  HENT: Negative.  Negative for ear discharge, ear pain, hearing loss, nosebleeds and sore throat.   Eyes: Negative.  Negative for blurred vision and pain.  Respiratory: Negative.  Negative for cough, hemoptysis, shortness of breath and wheezing.   Cardiovascular: Negative.  Negative for chest pain, palpitations and leg swelling.  Gastrointestinal: Negative.  Negative for abdominal pain, blood in stool, diarrhea, nausea and vomiting.  Genitourinary: Negative.  Negative for dysuria.  Musculoskeletal: Negative.  Negative for back pain.  Skin: Negative.   Neurological: Negative for dizziness, tremors, speech change, focal weakness, seizures and headaches.  Endo/Heme/Allergies: Negative.  Does not bruise/bleed easily.  Psychiatric/Behavioral: Negative.  Negative for depression, hallucinations and suicidal ideas.     PHYSICAL EXAMINATION:  GENERAL:  58 y.o.-year-old patient lying in the bed with no acute distress.  NECK:  Supple, no jugular venous distention. No thyroid enlargement, no tenderness.  LUNGS: Normal breath sounds bilaterally, no wheezing,  rales,rhonchi  No use of accessory muscles of respiration.  CARDIOVASCULAR: S1, S2 normal. No murmurs, rubs, or gallops.  ABDOMEN: Soft, non-tender, non-distended. Bowel sounds present. No organomegaly or mass.  EXTREMITIES: No pedal edema, cyanosis, or clubbing.  PSYCHIATRIC: The patient is alert and oriented x 3.  SKIN: No obvious rash, lesion, or ulcer.   DATA REVIEW:   CBC Recent Labs  Lab 01/18/19 0500  WBC 7.0  HGB 10.5*  HCT 30.9*  PLT 383    Chemistries  Recent Labs  Lab 01/17/19 1111  01/18/19 0500 01/18/19 0833  NA 129*   < > 139 137  K 5.0   < > 3.3* 3.7  CL 99   < > 115* 113*  CO2 <7*   < > 15* 15*  GLUCOSE 705*   < > 157* 173*  BUN 23*   < > 14 12  CREATININE 1.47*   < > 0.75 0.72  CALCIUM 9.4   < > 8.5* 8.6*  MG  --   --  1.9  --   AST 10*  --   --   --  ALT 11  --   --   --   ALKPHOS 118  --   --   --   BILITOT 1.4*  --   --   --    < > = values in this interval not displayed.    Cardiac Enzymes No results for input(s): TROPONINI in the last 168 hours.  Microbiology Results  '@[email or web address]' @  RADIOLOGY:  Dg Chest Portable 1 View  Result Date: 01/17/2019 CLINICAL DATA:  Status post line placement EXAM: PORTABLE CHEST 1 VIEW COMPARISON:  December 29, 2018 FINDINGS: The heart size and mediastinal contours are within normal limits. Right central venous line is identified with distal tip in the superior vena cava. There is no pneumothorax. Both lungs are clear. The visualized skeletal structures are unremarkable. IMPRESSION: No active cardiopulmonary disease. Right central venous line is identified with distal tip in the superior vena cava. No pneumothorax. Electronically Signed   By: Abelardo Diesel M.D.   On: 01/17/2019 13:38      Allergies as of 01/18/2019   No Known Allergies     Medication List    STOP taking these medications   QUEtiapine 100 MG tablet Commonly known as: SEROQUEL   QUEtiapine 25 MG tablet Commonly known as: SEROQUEL     TAKE  these medications   albuterol 108 (90 Base) MCG/ACT inhaler Commonly known as: VENTOLIN HFA Inhale 2 puffs into the lungs every 6 (six) hours as needed for wheezing or shortness of breath.   aspirin EC 81 MG tablet Take 81 mg by mouth daily.   blood glucose meter kit and supplies Kit Dispense based on patient and insurance preference. Use up to four times daily as directed. (FOR ICD-9 250.00, 250.01).   buPROPion 150 MG 12 hr tablet Commonly known as: WELLBUTRIN SR Take 150 mg by mouth 2 (two) times daily.   diltiazem 120 MG 24 hr capsule Commonly known as: CARDIZEM CD Take 1 capsule (120 mg total) by mouth 2 (two) times a day.   folic acid 1 MG tablet Commonly known as: FOLVITE Take 1 tablet (1 mg total) by mouth daily.   gabapentin 100 MG capsule Commonly known as: NEURONTIN Take 1 capsule (100 mg total) by mouth 2 (two) times daily.   glipiZIDE 5 MG 24 hr tablet Commonly known as: GLUCOTROL XL Take 1 tablet (5 mg total) by mouth daily with breakfast.   Insulin Glargine 100 UNIT/ML Solostar Pen Commonly known as: LANTUS Inject 30 Units into the skin daily.   Insulin Pen Needle 32G X 5 MM Misc 30 Units by Does not apply route daily.   ipratropium-albuterol 0.5-2.5 (3) MG/3ML Soln Commonly known as: DUONEB Take 3 mLs by nebulization every 6 (six) hours as needed for wheezing, shortness of breath or cough.   lisinopril 10 MG tablet Commonly known as: Prinivil Take 1 tablet (10 mg total) by mouth daily.   lurasidone 40 MG Tabs tablet Commonly known as: LATUDA Take 1 tablet (40 mg total) by mouth every evening.   metFORMIN 500 MG tablet Commonly known as: GLUCOPHAGE Take 1 tablet (500 mg total) by mouth 2 (two) times daily with a meal.   multivitamin with minerals Tabs tablet Take 1 tablet by mouth daily.   NovoLOG FlexPen 100 UNIT/ML FlexPen Generic drug: insulin aspart Inject 5 Units into the skin 3 (three) times daily with meals.   senna-docusate 8.6-50 MG  tablet Commonly known as: Senokot-S Take 1 tablet by mouth at bedtime as needed for mild constipation.  thiamine 100 MG tablet Take 1 tablet (100 mg total) by mouth daily.         Management plans discussed with the patient and she is in agreement. Stable for discharge home  Patient should follow up with pcp  CODE STATUS:     Code Status Orders  (From admission, onward)         Start     Ordered   01/17/19 1620  Full code  Continuous     01/17/19 1619        Code Status History    Date Active Date Inactive Code Status Order ID Comments User Context   01/02/2019 1512 01/06/2019 1303 Full Code [id number]  Loletha Grayer, MD ED   12/26/2018 0356 12/30/2018 1055 Full Code [id number]  Harrie Foreman, MD Inpatient   03/04/2018 2203 03/07/2018 1426 Full Code [id number]  Demetrios Loll, MD Inpatient   02/16/2018 1330 02/18/2018 1412 Full Code [id number]  Gorden Harms, MD Inpatient   11/26/2017 1421 11/27/2017 1812 Full Code [id number]  Dustin Flock, MD Inpatient   10/16/2017 0212 10/17/2017 1427 Full Code [id number]  Fritzi Mandes, MD Inpatient   09/23/2017 2252 09/24/2017 1505 Full Code [id number]  Amelia Jo, MD Inpatient   05/15/2016 1745 05/16/2016 1749 Full Code [id number]  Dustin Flock, MD Inpatient   01/13/2016 0051 01/14/2016 1314 Full Code [id number]  Norval Morton, MD ED   01/08/2016 1916 01/09/2016 1704 Full Code [id number]  Theodoro Grist, MD Inpatient   Advance Care Planning Activity      TOTAL TIME TAKING CARE OF THIS PATIENT: 39 minutes.    Note: This dictation was prepared with Dragon dictation along with smaller phrase technology. Any transcriptional errors that result from this process are unintentional.  Bettey Costa M.D on 01/18/2019 at 12:05 PM  Between 7am to 6pm - Pager - [phone] After 6pm go to [email or web address] - password [email or web address] [facility]  Office  [phone]  CC: Primary care physician; [facility] [place]

## 2019-01-18 NOTE — Consult Note (Signed)
[facility] Face-to-Face Psychiatry Consult   Reason for Consult:  Medication managment Referring Physician:  EDP Patient Identification: Pamela Lane MRN:  [id number] Principal Diagnosis: DKA (diabetic ketoacidoses) [facility]) Diagnosis:  Principal Problem:   DKA (diabetic ketoacidoses) ([place]) Active Problems:   Bipolar disorder ([employer])   Total Time spent with patient: 20 minutes  Subjective:   Pamela Lane is a 58 y.o. female patient reports that she has not had her medications for 2 to 3 days.  Patient states that she does not want take Seroquel anymore.  Patient uses a lot of expletives to describe the medication as well as other medications and the people she is interacting with this far staff at the hospital.  Patient states that she does not like taking Seroquel because it is not helped her any she has not been taking it regularly anyway.  Patient is asked about other medications and patient states that she has not been taking her Wellbutrin and several years ago she had taken Depakote but she does not like it because it caused her to have headache.  Patient denies any suicidal or homicidal ideations and denies any hallucinations.  Patient did state that occasionally she will have suicidal thoughts but she has not had any today or yesterday.  HPI: 01/17/19: Patient is a 58 year old female who was admitted due to DKA.  Patient has frequent visits to the emergency room for hyperglycemia and has been admitted 3 times in the last month due to DKA.  Patient is a very poor historian as well as a noncompliant diabetic.  Patient's wife reports that she has been diagnosed with bipolar disorder in the past.  She states that she has been on Seroquel for a while but does not take it regularly and she has not taken it for the last 2 to 3 days.  There is concern for the patient taking Seroquel due to her diabetes as it can increase her blood sugar levels. I have contacted Dr. Dwyane Dee and consulted with her.   Due to patient's compliance did not feel comfortable with starting Depakote again as well as not starting lithium as patient would need frequent follow-up for lab checks.  Discussed starting Geodon versus Latuda insensitive to this once a day decided that it would be a better option with the decrease chance of increasing the patient's blood sugar levels.  At this moment the patient does not meet inpatient criteria as she has denied any suicidal or homicidal ideations and denies any hallucinations.  Patient will be followed up tomorrow to assess progression and for adverse effects to [place].  Recommendations for medication including discontinuing Seroquel 100 mg p.o. nightly, continuing Seroquel 25 mg p.o. 4 times daily PRN for agitation, and starting Latuda 40 mg p.o. daily.  01/18/19 Re-assessment: Patient is seen by me today for reassessment.  Patient is more alert and oriented today and is more pleasant.  She does not appear to be irritable or agitated at all.  She denies any medication side effects with the new medications started.  Patient's thought process was more clear and she states understanding and cooperation with starting the new medication and how the Seroquel could affect her blood sugar levels.  The patient denies any suicidal or homicidal ideations and denies any hallucinations.  Patient also states that this was never an harm herself.  Patient was asked why she stopped her medications and she reported that she was not able to hold food down due to vomiting so she had  stopped taking her medications because she needs to have food on her stomach before she takes them.  Patient adamantly denies having any issues with mental health prior to stopping her medications and again denies having any suicidal or homicidal ideations.  I have consulted with Dr. Lucianne Muss.  At this time the patient does not meet inpatient criteria and is psychiatrically cleared.  I will notify Dr. Juliene Pina of the recommendations through  Epic secure chat.  We will continue to recommend continuation of Wellbutrin SR 150 mg p.o. twice daily and Latuda 40 mg p.o. every evening.  Past Psychiatric History: Bipolar disorder, MDD, aggression  Risk to Self:   Risk to Others:   Prior Inpatient Therapy:   Prior Outpatient Therapy:    Past Medical History:  Past Medical History:  Diagnosis Date  . Asthma   . Clostridium difficile colitis   . Diabetes mellitus without complication (HCC)   . Hypertension   . Mental disorder    pt reports 'I have all of them mental disorders'    Past Surgical History:  Procedure Laterality Date  . ABDOMINAL HYSTERECTOMY     Family History:  Family History  Problem Relation Age of Onset  . Hypertension Mother    Family Psychiatric  History: Denies Social History:  Social History   Substance and Sexual Activity  Alcohol Use Yes  . Alcohol/week: 40.0 standard drinks  . Types: 40 Cans of beer per week   Comment: 3 big bottles of liquor     Social History   Substance and Sexual Activity  Drug Use Yes  . Types: Marijuana, Cocaine    Social History   Socioeconomic History  . Marital status: Single    Spouse name: Not on file  . Number of children: Not on file  . Years of education: Not on file  . Highest education level: Not on file  Occupational History  . Not on file  Social Needs  . Financial resource strain: Not on file  . Food insecurity    Worry: Not on file    Inability: Not on file  . Transportation needs    Medical: Not on file    Non-medical: Not on file  Tobacco Use  . Smoking status: Current Every Day Smoker    Packs/day: 0.10    Types: Cigarettes  . Smokeless tobacco: Never Used  Substance and Sexual Activity  . Alcohol use: Yes    Alcohol/week: 40.0 standard drinks    Types: 40 Cans of beer per week    Comment: 3 big bottles of liquor  . Drug use: Yes    Types: Marijuana, Cocaine  . Sexual activity: Not on file  Lifestyle  . Physical activity     Days per week: Not on file    Minutes per session: Not on file  . Stress: Not on file  Relationships  . Social Musician on phone: Not on file    Gets together: Not on file    Attends religious service: Not on file    Active member of club or organization: Not on file    Attends meetings of clubs or organizations: Not on file    Relationship status: Not on file  Other Topics Concern  . Not on file  Social History Narrative  . Not on file   Additional Social History:    Allergies:  No Known Allergies  Labs:  Results for orders placed or performed during the hospital encounter of 01/17/19 (  from the past 48 hour(s))  CBC with Differential/Platelet     Status: Abnormal   Collection Time: 01/17/19 11:11 AM  Result Value Ref Range   WBC 16.1 (H) 4.0 - 10.5 K/uL   RBC 4.21 3.87 - 5.11 MIL/uL   Hemoglobin 12.8 12.0 - 15.0 g/dL   HCT 78.239.9 95.636.0 - 21.346.0 %   MCV 94.8 80.0 - 100.0 fL   MCH 30.4 26.0 - 34.0 pg   MCHC 32.1 30.0 - 36.0 g/dL   RDW 08.613.2 57.811.5 - 46.915.5 %   Platelets 592 (H) 150 - 400 K/uL   nRBC 0.0 0.0 - 0.2 %   Neutrophils Relative % 86 %   Neutro Abs 13.8 (H) 1.7 - 7.7 K/uL   Lymphocytes Relative 7 %   Lymphs Abs 1.1 0.7 - 4.0 K/uL   Monocytes Relative 6 %   Monocytes Absolute 1.0 0.1 - 1.0 K/uL   Eosinophils Relative 0 %   Eosinophils Absolute 0.0 0.0 - 0.5 K/uL   Basophils Relative 0 %   Basophils Absolute 0.1 0.0 - 0.1 K/uL   Immature Granulocytes 1 %   Abs Immature Granulocytes 0.12 (H) 0.00 - 0.07 K/uL    Comment: Performed at [facility], [address] [place], [place] [address]  Comprehensive metabolic panel     Status: Abnormal   Collection Time: 01/17/19 11:11 AM  Result Value Ref Range   Sodium 129 (L) 135 - 145 mmol/L   Potassium 5.0 3.5 - 5.1 mmol/L   Chloride 99 98 - 111 mmol/L   CO2 <7 (L) 22 - 32 mmol/L   Glucose, Bld 705 (HH) 70 - 99 mg/dL    Comment: CRITICAL RESULT CALLED TO, READ BACK BY AND VERIFIED WITH JESSICA  FULCHER RN AT 1320 ON 01/17/2019 SNG    BUN 23 (H) 6 - 20 mg/dL   Creatinine, Ser 8.411.47 (H) 0.44 - 1.00 mg/dL   Calcium 9.4 8.9 - 32.410.3 mg/dL   Total Protein 8.4 (H) 6.5 - 8.1 g/dL   Albumin 4.0 3.5 - 5.0 g/dL   AST 10 (L) 15 - 41 U/L   ALT 11 0 - 44 U/L   Alkaline Phosphatase 118 38 - 126 U/L   Total Bilirubin 1.4 (H) 0.3 - 1.2 mg/dL   GFR calc non Af Amer 39 (L) >60 mL/min   GFR calc Af Amer 45 (L) >60 mL/min    Comment: Performed at [facility], [address] [place], [place] [address]  Urinalysis, Complete w Microscopic     Status: Abnormal   Collection Time: 01/17/19 11:12 AM  Result Value Ref Range   Color, Urine STRAW (A) YELLOW   APPearance CLEAR (A) CLEAR   Specific Gravity, Urine 1.018 1.005 - 1.030   pH 5.0 5.0 - 8.0   Glucose, UA >=500 (A) NEGATIVE mg/dL   Hgb urine dipstick SMALL (A) NEGATIVE   Bilirubin Urine NEGATIVE NEGATIVE   Ketones, ur 80 (A) NEGATIVE mg/dL   Protein, ur 30 (A) NEGATIVE mg/dL   Nitrite NEGATIVE NEGATIVE   Leukocytes,Ua NEGATIVE NEGATIVE   RBC / HPF 0-5 0 - 5 RBC/hpf   WBC, UA 0-5 0 - 5 WBC/hpf   Bacteria, UA NONE SEEN NONE SEEN   Squamous Epithelial / LPF 0-5 0 - 5   Mucus PRESENT     Comment: Performed at [facility], [address] [place], [place] [address]  Blood gas, venous     Status: Abnormal   Collection Time: 01/17/19  11:12 AM  Result Value Ref Range   pH, Ven 7.08 (LL) 7.250 - 7.430    Comment: CRITICAL RESULT CALLED TO, READ BACK BY AND VERIFIED WITH: CRITICAL VALUE 01/17/19,DR. ROBINSON/FD    pCO2, Ven <19.0 (LL) 44.0 - 60.0 mmHg    Comment: CRITICAL RESULT CALLED TO, READ BACK BY AND VERIFIED WITH: CRITICAL VALUE 01/17/19,DR. ROBINSON/FD    pO2, Ven 57.0 (H) 32.0 - 45.0 mmHg   Bicarbonate 4.4 (L) 20.0 - 28.0 mmol/L   Acid-base deficit 23.5 (H) 0.0 - 2.0 mmol/L   O2 Saturation 74.7 %   Patient temperature 37.0    Collection site VEIN    Sample type VEIN     Comment: Performed at [facility],  [address] [place], [place] [address]  Glucose, capillary     Status: Abnormal   Collection Time: 01/17/19 12:09 PM  Result Value Ref Range   Glucose-Capillary 572 (HH) 70 - 99 mg/dL  SARS Coronavirus 2 (CEPHEID- Performed in [facility] hospital lab), Hosp Order     Status: None   Collection Time: 01/17/19 12:38 PM   Specimen: Nasopharyngeal Swab  Result Value Ref Range   SARS Coronavirus 2 NEGATIVE NEGATIVE    Comment: (NOTE) If result is NEGATIVE SARS-CoV-2 target nucleic acids are NOT DETECTED. The SARS-CoV-2 RNA is generally detectable in upper and lower  respiratory specimens during the acute phase of infection. The lowest  concentration of SARS-CoV-2 viral copies this assay can detect is 250  copies / mL. A negative result does not preclude SARS-CoV-2 infection  and should not be used as the sole basis for treatment or other  patient management decisions.  A negative result may occur with  improper specimen collection / handling, submission of specimen other  than nasopharyngeal swab, presence of viral mutation(s) within the  areas targeted by this assay, and inadequate number of viral copies  (<250 copies / mL). A negative result must be combined with clinical  observations, patient history, and epidemiological information. If result is POSITIVE SARS-CoV-2 target nucleic acids are DETECTED. The SARS-CoV-2 RNA is generally detectable in upper and lower  respiratory specimens dur ing the acute phase of infection.  Positive  results are indicative of active infection with SARS-CoV-2.  Clinical  correlation with patient history and other diagnostic information is  necessary to determine patient infection status.  Positive results do  not rule out bacterial infection or co-infection with other viruses. If result is PRESUMPTIVE POSTIVE SARS-CoV-2 nucleic acids MAY BE PRESENT.   A presumptive positive result was obtained on the submitted specimen  and confirmed on repeat  testing.  While 2019 novel coronavirus  (SARS-CoV-2) nucleic acids may be present in the submitted sample  additional confirmatory testing may be necessary for epidemiological  and / or clinical management purposes  to differentiate between  SARS-CoV-2 and other Sarbecovirus currently known to infect humans.  If clinically indicated additional testing with an alternate test  methodology [phone]) is advised. The SARS-CoV-2 RNA is generally  detectable in upper and lower respiratory sp ecimens during the acute  phase of infection. The expected result is Negative. Fact Sheet for Patients:  BoilerBrush.com.cyhttps://www.fda.gov/media/136312/download Fact Sheet for Healthcare Providers: https://pope.com/https://www.fda.gov/media/136313/download This test is not yet approved or cleared by the [place] FDA and has been authorized for detection and/or diagnosis of SARS-CoV-2 by FDA under an Emergency Use Authorization (EUA).  This EUA will remain in effect (meaning this test can be used) for the duration of the COVID-19 declaration  under Section 564(b)(1) of the Act, 21 U.S.C. section 360bbb-3(b)(1), unless the authorization is terminated or revoked sooner. Performed at [facility], [address] [place], [place] [address]   Glucose, capillary     Status: Abnormal   Collection Time: 01/17/19  1:38 PM  Result Value Ref Range   Glucose-Capillary >600 (HH) 70 - 99 mg/dL  Glucose, capillary     Status: Abnormal   Collection Time: 01/17/19  2:39 PM  Result Value Ref Range   Glucose-Capillary 510 (HH) 70 - 99 mg/dL  Glucose, capillary     Status: Abnormal   Collection Time: 01/17/19  4:05 PM  Result Value Ref Range   Glucose-Capillary 359 (H) 70 - 99 mg/dL  Basic metabolic panel     Status: Abnormal   Collection Time: 01/17/19  5:17 PM  Result Value Ref Range   Sodium 134 (L) 135 - 145 mmol/L   Potassium 4.1 3.5 - 5.1 mmol/L   Chloride 114 (H) 98 - 111 mmol/L   CO2 8 (L) 22 - 32 mmol/L   Glucose, Bld  298 (H) 70 - 99 mg/dL   BUN 17 6 - 20 mg/dL   Creatinine, Ser 6.04 (H) 0.44 - 1.00 mg/dL   Calcium 8.2 (L) 8.9 - 10.3 mg/dL   GFR calc non Af Amer 53 (L) >60 mL/min   GFR calc Af Amer >60 >60 mL/min   Anion gap 12 5 - 15    Comment: Performed at [facility], [address] [place], [place] [address]  CULTURE, BLOOD (ROUTINE X 2) w Reflex to ID Panel     Status: None (Preliminary result)   Collection Time: 01/17/19  6:03 PM   Specimen: BLOOD  Result Value Ref Range   Specimen Description BLOOD RIGHT ARM    Special Requests      BOTTLES DRAWN AEROBIC AND ANAEROBIC Blood Culture results may not be optimal due to an inadequate volume of blood received in culture bottles   Culture      NO GROWTH < 24 HOURS Performed at [facility], [address] [place], [place] [address]    Report Status PENDING   CULTURE, BLOOD (ROUTINE X 2) w Reflex to ID Panel     Status: None (Preliminary result)   Collection Time: 01/17/19  6:11 PM   Specimen: BLOOD  Result Value Ref Range   Specimen Description BLOOD LEFT HAND    Special Requests      BOTTLES DRAWN AEROBIC AND ANAEROBIC Blood Culture adequate volume   Culture      NO GROWTH < 24 HOURS Performed at [facility], [address] [place], [place] [address]    Report Status PENDING   Glucose, capillary     Status: Abnormal   Collection Time: 01/17/19  6:11 PM  Result Value Ref Range   Glucose-Capillary 250 (H) 70 - 99 mg/dL  Glucose, capillary     Status: Abnormal   Collection Time: 01/17/19  7:21 PM  Result Value Ref Range   Glucose-Capillary 170 (H) 70 - 99 mg/dL  Glucose, capillary     Status: Abnormal   Collection Time: 01/17/19  8:26 PM  Result Value Ref Range   Glucose-Capillary 262 (H) 70 - 99 mg/dL  Basic metabolic panel     Status: Abnormal   Collection Time: 01/17/19  8:29 PM  Result Value Ref Range   Sodium 135 135 - 145 mmol/L   Potassium 3.8 3.5 - 5.1 mmol/L  Chloride 113 (H) 98 - 111  mmol/L   CO2 11 (L) 22 - 32 mmol/L   Glucose, Bld 230 (H) 70 - 99 mg/dL   BUN 15 6 - 20 mg/dL   Creatinine, Ser 1.61 0.44 - 1.00 mg/dL   Calcium 8.3 (L) 8.9 - 10.3 mg/dL   GFR calc non Af Amer >60 >60 mL/min   GFR calc Af Amer >60 >60 mL/min   Anion gap 11 5 - 15    Comment: Performed at [facility], [address] [place], [place] [address]  Procalcitonin - Baseline     Status: None   Collection Time: 01/17/19  8:29 PM  Result Value Ref Range   Procalcitonin <0.10 ng/mL    Comment:        Interpretation: PCT (Procalcitonin) <= 0.5 ng/mL: Systemic infection (sepsis) is not likely. Local bacterial infection is possible. (NOTE)       Sepsis PCT Algorithm           Lower Respiratory Tract                                      Infection PCT Algorithm    ----------------------------     ----------------------------         PCT < 0.25 ng/mL                PCT < 0.10 ng/mL         Strongly encourage             Strongly discourage   discontinuation of antibiotics    initiation of antibiotics    ----------------------------     -----------------------------       PCT 0.25 - 0.50 ng/mL            PCT 0.10 - 0.25 ng/mL               OR       >80% decrease in PCT            Discourage initiation of                                            antibiotics      Encourage discontinuation           of antibiotics    ----------------------------     -----------------------------         PCT >= 0.50 ng/mL              PCT 0.26 - 0.50 ng/mL               AND        <80% decrease in PCT             Encourage initiation of                                             antibiotics       Encourage continuation           of antibiotics    ----------------------------     -----------------------------        PCT >= 0.50 ng/mL  PCT > 0.50 ng/mL               AND         increase in PCT                  Strongly encourage                                      initiation of  antibiotics    Strongly encourage escalation           of antibiotics                                     -----------------------------                                           PCT <= 0.25 ng/mL                                                 OR                                        > 80% decrease in PCT                                     Discontinue / Do not initiate                                             antibiotics Performed at [facility], [address] [place], [place] [address]   Glucose, capillary     Status: Abnormal   Collection Time: 01/17/19  9:28 PM  Result Value Ref Range   Glucose-Capillary 219 (H) 70 - 99 mg/dL  Glucose, capillary     Status: Abnormal   Collection Time: 01/17/19 10:23 PM  Result Value Ref Range   Glucose-Capillary 171 (H) 70 - 99 mg/dL  Glucose, capillary     Status: Abnormal   Collection Time: 01/17/19 11:26 PM  Result Value Ref Range   Glucose-Capillary 154 (H) 70 - 99 mg/dL  Glucose, capillary     Status: Abnormal   Collection Time: 01/18/19 12:32 AM  Result Value Ref Range   Glucose-Capillary 164 (H) 70 - 99 mg/dL  Basic metabolic panel     Status: Abnormal   Collection Time: 01/18/19  1:11 AM  Result Value Ref Range   Sodium 138 135 - 145 mmol/L   Potassium 3.2 (L) 3.5 - 5.1 mmol/L   Chloride 115 (H) 98 - 111 mmol/L   CO2 15 (L) 22 - 32 mmol/L   Glucose, Bld 162 (H) 70 - 99 mg/dL   BUN 14 6 - 20 mg/dL   Creatinine, Ser 6.04 0.44 - 1.00 mg/dL   Calcium 8.5 (L) 8.9 - 10.3 mg/dL  GFR calc non Af Amer >60 >60 mL/min   GFR calc Af Amer >60 >60 mL/min   Anion gap 8 5 - 15    Comment: Performed at [facility], [address] [place], [place] [address]  Glucose, capillary     Status: Abnormal   Collection Time: 01/18/19  1:17 AM  Result Value Ref Range   Glucose-Capillary 158 (H) 70 - 99 mg/dL  Glucose, capillary     Status: Abnormal   Collection Time: 01/18/19  2:22 AM  Result Value Ref Range    Glucose-Capillary 140 (H) 70 - 99 mg/dL  Glucose, capillary     Status: Abnormal   Collection Time: 01/18/19  3:24 AM  Result Value Ref Range   Glucose-Capillary 153 (H) 70 - 99 mg/dL  Glucose, capillary     Status: Abnormal   Collection Time: 01/18/19  4:27 AM  Result Value Ref Range   Glucose-Capillary 151 (H) 70 - 99 mg/dL  Basic metabolic panel     Status: Abnormal   Collection Time: 01/18/19  5:00 AM  Result Value Ref Range   Sodium 139 135 - 145 mmol/L   Potassium 3.3 (L) 3.5 - 5.1 mmol/L   Chloride 115 (H) 98 - 111 mmol/L   CO2 15 (L) 22 - 32 mmol/L   Glucose, Bld 157 (H) 70 - 99 mg/dL   BUN 14 6 - 20 mg/dL   Creatinine, Ser 6.04 0.44 - 1.00 mg/dL   Calcium 8.5 (L) 8.9 - 10.3 mg/dL   GFR calc non Af Amer >60 >60 mL/min   GFR calc Af Amer >60 >60 mL/min   Anion gap 9 5 - 15    Comment: Performed at [facility], [address] [place], [place] [address]  CBC     Status: Abnormal   Collection Time: 01/18/19  5:00 AM  Result Value Ref Range   WBC 7.0 4.0 - 10.5 K/uL   RBC 3.43 (L) 3.87 - 5.11 MIL/uL   Hemoglobin 10.5 (L) 12.0 - 15.0 g/dL   HCT 11.9 (L) 14.7 - 82.9 %   MCV 90.1 80.0 - 100.0 fL   MCH 30.6 26.0 - 34.0 pg   MCHC 34.0 30.0 - 36.0 g/dL   RDW 56.2 13.0 - 86.5 %   Platelets 383 150 - 400 K/uL   nRBC 0.0 0.0 - 0.2 %    Comment: Performed at [facility], [address] [place], [place] [address]  Magnesium     Status: None   Collection Time: 01/18/19  5:00 AM  Result Value Ref Range   Magnesium 1.9 1.7 - 2.4 mg/dL    Comment: Performed at [facility], [address] [place], [place] [address]  Procalcitonin     Status: None   Collection Time: 01/18/19  5:00 AM  Result Value Ref Range   Procalcitonin <0.10 ng/mL    Comment:        Interpretation: PCT (Procalcitonin) <= 0.5 ng/mL: Systemic infection (sepsis) is not likely. Local bacterial infection is possible. (NOTE)       Sepsis PCT Algorithm           Lower  Respiratory Tract                                      Infection PCT Algorithm    ----------------------------     ----------------------------  PCT < 0.25 ng/mL                PCT < 0.10 ng/mL         Strongly encourage             Strongly discourage   discontinuation of antibiotics    initiation of antibiotics    ----------------------------     -----------------------------       PCT 0.25 - 0.50 ng/mL            PCT 0.10 - 0.25 ng/mL               OR       >80% decrease in PCT            Discourage initiation of                                            antibiotics      Encourage discontinuation           of antibiotics    ----------------------------     -----------------------------         PCT >= 0.50 ng/mL              PCT 0.26 - 0.50 ng/mL               AND        <80% decrease in PCT             Encourage initiation of                                             antibiotics       Encourage continuation           of antibiotics    ----------------------------     -----------------------------        PCT >= 0.50 ng/mL                  PCT > 0.50 ng/mL               AND         increase in PCT                  Strongly encourage                                      initiation of antibiotics    Strongly encourage escalation           of antibiotics                                     -----------------------------                                           PCT <= 0.25 ng/mL                                                   OR                                        > 80% decrease in PCT                                     Discontinue / Do not initiate                                             antibiotics Performed at [facility], [address] [place], [place] [address]   Glucose, capillary     Status: Abnormal   Collection Time: 01/18/19  5:24 AM  Result Value Ref Range   Glucose-Capillary 153 (H) 70 - 99 mg/dL  Glucose, capillary     Status: Abnormal    Collection Time: 01/18/19  6:23 AM  Result Value Ref Range   Glucose-Capillary 137 (H) 70 - 99 mg/dL  Glucose, capillary     Status: Abnormal   Collection Time: 01/18/19  7:28 AM  Result Value Ref Range   Glucose-Capillary 169 (H) 70 - 99 mg/dL    Current Facility-Administered Medications  Medication Dose Route Frequency Provider Last Rate Last Dose  . 0.9 %  sodium chloride infusion   Intravenous Continuous Eugenie NorrieBlakeney, Dana G, NP 100 mL/hr at 01/17/19 1824    . acetaminophen (TYLENOL) tablet 650 mg  650 mg Oral Q6H PRN Alford HighlandWieting, Richard, MD       Or  . acetaminophen (TYLENOL) suppository 650 mg  650 mg Rectal Q6H PRN Wieting, Richard, MD      . albuterol (PROVENTIL) (2.5 MG/3ML) 0.083% nebulizer solution 2.5 mg  2.5 mg Inhalation Q6H PRN Alford HighlandWieting, Richard, MD      . aspirin EC tablet 81 mg  81 mg Oral Daily Wieting, Richard, MD      . buPROPion [facility](WELLBUTRIN SR) 12 hr tablet 150 mg  150 mg Oral BID Alford HighlandWieting, Richard, MD   150 mg at 01/17/19 2134  . Chlorhexidine Gluconate Cloth 2 % PADS 6 each  6 each Topical Daily Merwyn KatosSimonds, David B, MD   6 each at 01/17/19 1710  . dextrose 5 % in lactated ringers infusion   Intravenous Continuous Harlon DittyKeene, Jeremiah D, NP 100 mL/hr at 01/18/19 0716    . dextrose 50 % solution 25 mL  25 mL Intravenous PRN Alford HighlandWieting, Richard, MD      . diltiazem (CARDIZEM CD) 24 hr capsule 120 mg  120 mg Oral BID Alford HighlandWieting, Richard, MD   120 mg at 01/17/19 2133  . enoxaparin (LOVENOX) injection 40 mg  40 mg Subcutaneous Q24H Alford HighlandWieting, Richard, MD   40 mg at 01/17/19 2134  . folic acid (FOLVITE) tablet 1 mg  1 mg Oral Daily Wieting, Richard, MD      . gabapentin (NEURONTIN) capsule 100 mg  100 mg Oral BID Alford HighlandWieting, Richard, MD   100 mg at 01/17/19 2132  . insulin regular bolus via infusion 0-10 Units  0-10 Units Intravenous TID WC Wieting, Richard, MD      . insulin regular, human (MYXREDLIN) 100 units/ 100 mL infusion   Intravenous Continuous Alford HighlandWieting, Richard, MD 2.2 mL/hr at 01/17/19  1923 2.2 Units/hr at 01/17/19 1923  .  LORazepam (ATIVAN) tablet 1 mg  1 mg Oral Q6H PRN Eugenie Norrie, NP       Or  . LORazepam (ATIVAN) injection 1 mg  1 mg Intravenous Q6H PRN Eugenie Norrie, NP      . lurasidone (LATUDA) tablet 40 mg  40 mg Oral QPM Alford Highland, MD   40 mg at 01/17/19 1824  . multivitamin with minerals tablet 1 tablet  1 tablet Oral Daily Wieting, Richard, MD      . QUEtiapine (SEROQUEL) tablet 25 mg  25 mg Oral Q4H PRN Wieting, Richard, MD      . thiamine (VITAMIN B-1) tablet 100 mg  100 mg Oral Daily Alford Highland, MD        Musculoskeletal: Strength & Muscle Tone: decreased Gait & Station: Patient remained in bed during evaluation Patient leans: N/A  Psychiatric Specialty Exam: Physical Exam  Nursing note and vitals reviewed. Constitutional: She is oriented to person, place, and time. She appears well-developed and well-nourished.  Respiratory: Effort normal.  Neurological: She is alert and oriented to person, place, and time.  Skin: Skin is warm.    Review of Systems  HENT: Negative.   Eyes: Negative.   Musculoskeletal: Negative.   Skin: Negative.   Psychiatric/Behavioral: Negative.        Self reported bipolar disorder    Blood pressure 117/74, pulse 97, temperature 98.1 F (36.7 C), resp. rate 17, height 5\' 3"  (1.6 m), weight 86.5 kg, SpO2 98 %.Body mass index is 33.78 kg/m.  General Appearance: Casual  Eye Contact:  Good  Speech:  Clear and Coherent  Volume:  Normal  Mood:  Euthymic  Affect:  Congruent  Thought Process:  Coherent and Descriptions of Associations: Intact  Orientation:  Full (Time, Place, and Person)  Thought Content:  WDL  Suicidal Thoughts:  No  Homicidal Thoughts:  No  Memory:  Immediate;   Good Recent;   Good Remote;   Poor  Judgement:  Fair  Insight:  Fair  Psychomotor Activity:  Decreased  Concentration:  Concentration: Good and Attention Span: Good  Recall:  Good  Fund of Knowledge:  Good  Language:   Good  Akathisia:  No  Handed:  Right  AIMS (if indicated):     Assets:  Financial Resources/Insurance Housing Resilience Social Support  ADL's:  Intact  Cognition:  WNL  Sleep:        Treatment Plan Summary: Daily contact with patient to assess and evaluate symptoms and progress in treatment and Medication management  Due to patient's frequent visits to the ED for hyperglycemia and DKA there is concern for continuing on Seroquel as it may worsen the patient's diabetes. Discontinue Seroquel 100 mg p.o. nightly Continue Seroquel 25 mg p.o. 4 times daily as needed for agitation Start Latuda 40 mg p.o. daily with supper Continue Wellbutrin SR 150 mg p.o. twice daily   01/18/19: Continue Latuda 40 mg PO Daily and Wellbutrin SR 150 mg PO BID. Psychiatry will sign off as patient appears to be stable mentally. Please re-consult if any issues with medications or mental health.  Disposition: No evidence of imminent risk to self or others at present.   Patient does not meet criteria for psychiatric inpatient admission. Supportive therapy provided about ongoing stressors. Discussed crisis plan, support from social network, calling 911, coming to the Emergency Department, and calling Suicide Hotline.  Gerlene Burdock Rikia Sukhu, FNP 01/18/2019 8:30 AM

## 2019-01-18 NOTE — Progress Notes (Signed)
Patient has prescription for Latuda and glucometer/supplies. Went over discharge instructions verbally and written. Patient will call for follow up with [facility]. Patient has no further questions. [facility] waiting [facility] medical mall to take her home.

## 2019-01-18 NOTE — Progress Notes (Signed)
   Name: Pamela Lane MRN: [id number] DOB: 1961/04/23    ADMISSION DATE:  01/17/2019  BRIEF PATIENT DESCRIPTION:  58 yo female admitted with DKA requiring insulin gtt   SIGNIFICANT EVENTS/STUDIES:  07/8-Pt admitted to the stepdown unit on insulin gtt 07/9-Insulin gtt discontinued   CULTURES:  Blood x2 07/8>>negative  SARS Coronovirus 07/8>>negative   PAST MEDICAL HISTORY :   has a past medical history of Asthma, Clostridium difficile colitis, Diabetes mellitus without complication ([place]), Hypertension, and Mental disorder.  has a past surgical history that includes Abdominal hysterectomy.  SUBJECTIVE:  She states due to the recent death of her sister she has not been able to eat, therefore she has not been taking her insulin.   VITAL SIGNS: Temp:  [97.5 F (36.4 C)-98.8 F (37.1 C)] 98.1 F (36.7 C) (07/09 0700) Pulse Rate:  [70-137] 97 (07/09 0800) Resp:  [14-27] 17 (07/09 0800) BP: (95-167)/(51-120) 117/74 (07/09 0800) SpO2:  [97 %-100 %] 98 % (07/09 0800) Weight:  [86.5 kg-99.8 kg] 86.5 kg (07/08 1630)  PHYSICAL EXAMINATION: General: well developed, well nourished female, NAD  Neuro: alert and oriented, follows commands HEENT: supple, no JVD  Cardiovascular: sinus tachycardia, no R/G  Lungs: clear throughout, even, non labored  Abdomen: +BS x4, soft, non tender, non distended  Musculoskeletal: normal bulk and tone, no edema  Skin: intact no rashes or lesions present   Recent Labs  Lab 01/18/19 0111 01/18/19 0500 01/18/19 0833  NA 138 139 137  K 3.2* 3.3* 3.7  CL 115* 115* 113*  CO2 15* 15* 15*  BUN 14 14 12   CREATININE 0.82 0.75 0.72  GLUCOSE 162* 157* 173*   Recent Labs  Lab 01/17/19 1111 01/18/19 0500  HGB 12.8 10.5*  HCT 39.9 30.9*  WBC 16.1* 7.0  PLT 592* 383   Dg Chest Portable 1 View  Result Date: 01/17/2019 CLINICAL DATA:  Status post line placement EXAM: PORTABLE CHEST 1 VIEW COMPARISON:  December 29, 2018 FINDINGS: The heart size and  mediastinal contours are within normal limits. Right central venous line is identified with distal tip in the superior vena cava. There is no pneumothorax. Both lungs are clear. The visualized skeletal structures are unremarkable. IMPRESSION: No active cardiopulmonary disease. Right central venous line is identified with distal tip in the superior vena cava. No pneumothorax. Electronically Signed   By: Pamela Lane M.D.   On: 01/17/2019 13:38    ASSESSMENT / PLAN:  Diabetic ketoacidosis-resolved  CBG's ac/hs  Continue SSI, lantus, and glipizide  Diabetes coordinator consulted appreciate input   Acute renal failure likely secondary to dehydration in setting of DKA-resolved Hypochloridemia Trend BMP Replace electrolytes as indicated  Monitor UOP  Avoid nephrotoxic medications    Hypertension Continuous telemetry monitoring  Continue outpatient po cardizem   Leukocytosis no obvious signs of infection  Trend WBC and monitor fever curve  Follow cultures  If pt develops diarrhea will check for Cdiff   ETOH Abuse  Current everyday smoker  CIWA protocol initiated-ativan per protocol  Smoking and ETOH abuse cessation counseling provided  Psychiatry consulted appreciate input-psych meds per recommendations   VTE px: subq lovenox   -Pt stable for transfer to the medsurg unit once bed available PCCM will sign off.   Pamela Lane, [place] Pager [phone] (please enter 7 digits) PCCM Consult Pager [phone] (please enter 7 digits)

## 2019-01-18 NOTE — Progress Notes (Signed)
Inpatient Diabetes Program Recommendations  AACE/ADA: New Consensus Statement on Inpatient Glycemic Control   Target Ranges:  Prepandial:   less than 140 mg/dL      Peak postprandial:   less than 180 mg/dL (1-2 hours)      Critically ill patients:  140 - 180 mg/dL  Results for AVANTI, JETTER (MRN [id number]) as of 01/18/2019 07:58  Ref. Range 01/18/2019 00:32 01/18/2019 01:17 01/18/2019 02:22 01/18/2019 03:24 01/18/2019 04:27 01/18/2019 05:24 01/18/2019 06:23 01/18/2019 07:28  Glucose-Capillary Latest Ref Range: 70 - 99 mg/dL 164 (H) 158 (H) 140 (H) 153 (H) 151 (H) 153 (H) 137 (H) 169 (H)   Results for KRYSTI, HICKLING (MRN [id number]) as of 01/17/2019 13:26  Ref. Range 01/17/2019 11:11  Glucose Latest Ref Range: 70 - 99 mg/dL [facility])  Results for DENAE, ZULUETA (MRN [id number]) as of 01/17/2019 13:26  Ref. Range 12/26/2018 12:30  Hemoglobin A1C Latest Ref Range: 4.8 - 5.6 % 14.2 (H)   Review of Glycemic Control  Diabetes history: DM2 Outpatient Diabetes medications: Lantus 30 units daily, Novolog 5 units TID with meals,Metformin500 mg BID, Glipizide XL 5 mg QAM Current orders for Inpatient glycemic control: IV insulin  Inpatient Diabetes Program Recommendations:   Insulin at Transition from IV to SQ: Once MD is ready to transition patient from IV to SQ insulin, please consider ordering Lantus 25 units Q24H, CBGs Q4H, Novolog 0-15 units Q4H, and when diet resumed please order Novolog 5 units TID with meals for meal coverage if patient eats at least 50% of meals.  NOTE: Noted patient is currently in the Emergency Room at this time and IV insulin has been ordered. Patient was recently inpatient from 01/02/19 to 01/06/19 and seen by the Inpatient Diabetes team on 01/03/19 (see note for details). Patient had reported at that time that she "had not taken any DM meds lately because she is out of medications." Diabetes Coordinator had requested that new prescriptions be given for all DM medications at time of  discharge on 01/06/19. On 01/06/19, patient was discharged on Lantus 30 units daily, Novolog 5 units TID with meals, Metformin 500 mg BID, and Glipizide XL 5 mg daily.     Thanks, Barnie Alderman, RN, MSN, CDE Diabetes Coordinator Inpatient Diabetes Program [phone] (Team Pager from 8am to 5pm)

## 2019-01-18 NOTE — Progress Notes (Signed)
Per Dr Benjie Karvonen patient can be discharged. When going into the AVS message stated that patient has a documented legal guardian. Spoke with [employer] and she will investigate and reach back to me.

## 2019-01-18 NOTE — Progress Notes (Signed)
Per [employer] worker [phone] patient does not have a legal Guardian. Patient being discharged per Dr Benjie Karvonen.

## 2019-01-18 NOTE — Clinical Social Work Note (Signed)
CSW was contacted by ICU nurse for patient inquiring if patient had a guardian. Patient informed nurse she does not have a guardian. CSW contacted patient's daughter: Jethro Bastos: [phone] to inquire if patient has a guardian and Mrs. Simona Huh said that she does not have a guardian. Shela Leff MSW,LCSW [phone]

## 2019-01-18 NOTE — Progress Notes (Signed)
Patient states that she has lost her glucometer, strips and lancets. Per care manager she will need a new prescription and get it re-filled.

## 2019-01-22 LAB — CULTURE, BLOOD (ROUTINE X 2)
Culture: NO GROWTH
Culture: NO GROWTH
Special Requests: ADEQUATE

## 2019-04-01 ENCOUNTER — Inpatient Hospital Stay
Admission: EM | Admit: 2019-04-01 | Discharge: 2019-04-02 | DRG: 638 | Discharge status: Against Medical Advice | Payer: Medicaid Other | Attending: Specialist | Admitting: Specialist

## 2019-04-01 ENCOUNTER — Other Ambulatory Visit: Payer: Self-pay

## 2019-04-01 ENCOUNTER — Emergency Department: Payer: Medicaid Other

## 2019-04-01 DIAGNOSIS — Z20828 Contact with and (suspected) exposure to other viral communicable diseases: Secondary | ICD-10-CM | POA: Diagnosis present

## 2019-04-01 DIAGNOSIS — F17210 Nicotine dependence, cigarettes, uncomplicated: Secondary | ICD-10-CM | POA: Diagnosis present

## 2019-04-01 DIAGNOSIS — F329 Major depressive disorder, single episode, unspecified: Secondary | ICD-10-CM | POA: Diagnosis present

## 2019-04-01 DIAGNOSIS — F419 Anxiety disorder, unspecified: Secondary | ICD-10-CM | POA: Diagnosis present

## 2019-04-01 DIAGNOSIS — Z9114 Patient's other noncompliance with medication regimen: Secondary | ICD-10-CM

## 2019-04-01 DIAGNOSIS — I10 Essential (primary) hypertension: Secondary | ICD-10-CM | POA: Diagnosis present

## 2019-04-01 DIAGNOSIS — J45909 Unspecified asthma, uncomplicated: Secondary | ICD-10-CM | POA: Diagnosis present

## 2019-04-01 DIAGNOSIS — B373 Candidiasis of vulva and vagina: Secondary | ICD-10-CM | POA: Diagnosis present

## 2019-04-01 DIAGNOSIS — Z79899 Other long term (current) drug therapy: Secondary | ICD-10-CM

## 2019-04-01 DIAGNOSIS — Z8249 Family history of ischemic heart disease and other diseases of the circulatory system: Secondary | ICD-10-CM

## 2019-04-01 DIAGNOSIS — E1140 Type 2 diabetes mellitus with diabetic neuropathy, unspecified: Secondary | ICD-10-CM | POA: Diagnosis present

## 2019-04-01 DIAGNOSIS — N179 Acute kidney failure, unspecified: Secondary | ICD-10-CM | POA: Diagnosis present

## 2019-04-01 DIAGNOSIS — Z794 Long term (current) use of insulin: Secondary | ICD-10-CM

## 2019-04-01 DIAGNOSIS — Z7982 Long term (current) use of aspirin: Secondary | ICD-10-CM | POA: Diagnosis not present

## 2019-04-01 DIAGNOSIS — E1110 Type 2 diabetes mellitus with ketoacidosis without coma: Secondary | ICD-10-CM | POA: Diagnosis present

## 2019-04-01 LAB — SARS CORONAVIRUS 2 (TAT 6-24 HRS): SARS Coronavirus 2: NEGATIVE

## 2019-04-01 LAB — BASIC METABOLIC PANEL
Anion gap: 13 (ref 5–15)
Anion gap: 9 (ref 5–15)
BUN: 16 mg/dL (ref 6–20)
BUN: 16 mg/dL (ref 6–20)
CO2: 18 mmol/L — ABNORMAL LOW (ref 22–32)
CO2: 22 mmol/L (ref 22–32)
Calcium: 8.5 mg/dL — ABNORMAL LOW (ref 8.9–10.3)
Calcium: 8.8 mg/dL — ABNORMAL LOW (ref 8.9–10.3)
Chloride: 103 mmol/L (ref 98–111)
Chloride: 104 mmol/L (ref 98–111)
Creatinine, Ser: 0.73 mg/dL (ref 0.44–1.00)
Creatinine, Ser: 0.88 mg/dL (ref 0.44–1.00)
GFR calc Af Amer: >60 mL/min (ref >60)
GFR calc Af Amer: >60 mL/min (ref >60)
GFR calc non Af Amer: >60 mL/min (ref >60)
GFR calc non Af Amer: >60 mL/min (ref >60)
Glucose, Bld: 143 mg/dL — ABNORMAL HIGH (ref 70–99)
Glucose, Bld: 253 mg/dL — ABNORMAL HIGH (ref 70–99)
Potassium: 3.3 mmol/L — ABNORMAL LOW (ref 3.5–5.1)
Potassium: 3.5 mmol/L (ref 3.5–5.1)
Sodium: 134 mmol/L — ABNORMAL LOW (ref 135–145)
Sodium: 135 mmol/L (ref 135–145)

## 2019-04-01 LAB — BLOOD GAS, VENOUS
Acid-base deficit: 6.9 mmol/L — ABNORMAL HIGH (ref 0.0–2.0)
Bicarbonate: 20.9 mmol/L (ref 20.0–28.0)
O2 Saturation: 56.3 %
Patient temperature: 37
pCO2, Ven: 50 mmHg (ref 44.0–60.0)
pH, Ven: 7.23 — ABNORMAL LOW (ref 7.250–7.430)
pO2, Ven: 36 mmHg (ref 32.0–45.0)

## 2019-04-01 LAB — COMPREHENSIVE METABOLIC PANEL
ALT: 17 U/L (ref 0–44)
AST: 13 U/L — ABNORMAL LOW (ref 15–41)
Albumin: 4.5 g/dL (ref 3.5–5.0)
Alkaline Phosphatase: 116 U/L (ref 38–126)
Anion gap: 19 — ABNORMAL HIGH (ref 5–15)
BUN: 18 mg/dL (ref 6–20)
CO2: 20 mmol/L — ABNORMAL LOW (ref 22–32)
Calcium: 9.7 mg/dL (ref 8.9–10.3)
Chloride: 94 mmol/L — ABNORMAL LOW (ref 98–111)
Creatinine, Ser: 1.39 mg/dL — ABNORMAL HIGH (ref 0.44–1.00)
GFR calc Af Amer: 49 mL/min — ABNORMAL LOW (ref >60)
GFR calc non Af Amer: 42 mL/min — ABNORMAL LOW (ref >60)
Glucose, Bld: 654 mg/dL (ref 70–99)
Potassium: 4.6 mmol/L (ref 3.5–5.1)
Sodium: 133 mmol/L — ABNORMAL LOW (ref 135–145)
Total Bilirubin: 1.4 mg/dL — ABNORMAL HIGH (ref 0.3–1.2)
Total Protein: 8.3 g/dL — ABNORMAL HIGH (ref 6.5–8.1)

## 2019-04-01 LAB — GLUCOSE, CAPILLARY
Glucose-Capillary: >600 mg/dL (ref 70–99)
Glucose-Capillary: 561 mg/dL (ref 70–99)
Glucose-Capillary: 359 mg/dL — ABNORMAL HIGH (ref 70–99)
Glucose-Capillary: 367 mg/dL — ABNORMAL HIGH (ref 70–99)
Glucose-Capillary: 282 mg/dL — ABNORMAL HIGH (ref 70–99)
Glucose-Capillary: 125 mg/dL — ABNORMAL HIGH (ref 70–99)
Glucose-Capillary: 186 mg/dL — ABNORMAL HIGH (ref 70–99)
Glucose-Capillary: 231 mg/dL — ABNORMAL HIGH (ref 70–99)
Glucose-Capillary: 246 mg/dL — ABNORMAL HIGH (ref 70–99)
Glucose-Capillary: 185 mg/dL — ABNORMAL HIGH (ref 70–99)
Glucose-Capillary: 109 mg/dL — ABNORMAL HIGH (ref 70–99)
Glucose-Capillary: 114 mg/dL — ABNORMAL HIGH (ref 70–99)

## 2019-04-01 LAB — MRSA PCR SCREENING: MRSA by PCR: NEGATIVE

## 2019-04-01 LAB — URINALYSIS, COMPLETE (UACMP) WITH MICROSCOPIC
Bacteria, UA: NONE SEEN
Bilirubin Urine: NEGATIVE
Glucose, UA: >=500 mg/dL — AB
Hgb urine dipstick: NEGATIVE
Ketones, ur: 20 mg/dL — AB
Leukocytes,Ua: NEGATIVE
Nitrite: NEGATIVE
Protein, ur: NEGATIVE mg/dL
Specific Gravity, Urine: 1.027 (ref 1.005–1.030)
pH: 5 (ref 5.0–8.0)

## 2019-04-01 LAB — HEMOGLOBIN A1C
Hgb A1c MFr Bld: 13.2 % — ABNORMAL HIGH (ref 4.8–5.6)
Mean Plasma Glucose: 332.14 mg/dL

## 2019-04-01 LAB — CBC
HCT: 46.7 % — ABNORMAL HIGH (ref 36.0–46.0)
Hemoglobin: 16.3 g/dL — ABNORMAL HIGH (ref 12.0–15.0)
MCH: 30.5 pg (ref 26.0–34.0)
MCHC: 34.9 g/dL (ref 30.0–36.0)
MCV: 87.5 fL (ref 80.0–100.0)
Platelets: 299 10*3/uL (ref 150–400)
RBC: 5.34 MIL/uL — ABNORMAL HIGH (ref 3.87–5.11)
RDW: 11.5 % (ref 11.5–15.5)
WBC: 8.2 10*3/uL (ref 4.0–10.5)
nRBC: 0 % (ref 0.0–0.2)

## 2019-04-01 MED ORDER — ONDANSETRON HCL 4 MG/2ML IJ SOLN: 4.0000 mg | Freq: Four times a day (QID) | INTRAMUSCULAR | Status: DC | PRN

## 2019-04-01 MED ORDER — LURASIDONE HCL 40 MG PO TABS
40.0000 mg | ORAL_TABLET | Freq: Every evening | ORAL | Status: DC
  Administered 2019-04-01: 18:00:00 40 mg via ORAL
  Filled 2019-04-01 (×3): qty 1

## 2019-04-01 MED ORDER — BISACODYL 5 MG PO TBEC: 5.0000 mg | DELAYED_RELEASE_TABLET | Freq: Every day | ORAL | Status: DC | PRN

## 2019-04-01 MED ORDER — DILTIAZEM HCL ER COATED BEADS 120 MG PO CP24
120.0000 mg | ORAL_CAPSULE | Freq: Two times a day (BID) | ORAL | Status: DC
  Administered 2019-04-01 – 2019-04-02 (×2): 120 mg via ORAL
  Filled 2019-04-01 (×3): qty 1

## 2019-04-01 MED ORDER — NICOTINE 14 MG/24HR TD PT24: 14.0000 mg | MEDICATED_PATCH | Freq: Every day | TRANSDERMAL | Status: DC

## 2019-04-01 MED ORDER — ASPIRIN EC 81 MG PO TBEC
81.0000 mg | DELAYED_RELEASE_TABLET | Freq: Every day | ORAL | Status: DC
  Administered 2019-04-01 – 2019-04-02 (×2): 81 mg via ORAL
  Filled 2019-04-01 (×2): qty 1

## 2019-04-01 MED ORDER — POTASSIUM CHLORIDE 10 MEQ/100ML IV SOLN
10.0000 meq | INTRAVENOUS | Status: AC
  Administered 2019-04-01 (×2): 10 meq via INTRAVENOUS
  Filled 2019-04-01 (×2): qty 100

## 2019-04-01 MED ORDER — VITAMIN B-1 50 MG PO TABS
100.0000 mg | ORAL_TABLET | Freq: Every day | ORAL | Status: DC
  Administered 2019-04-02: 10:00:00 100 mg via ORAL
  Filled 2019-04-01: qty 2

## 2019-04-01 MED ORDER — GABAPENTIN 100 MG PO CAPS
100.0000 mg | ORAL_CAPSULE | Freq: Two times a day (BID) | ORAL | Status: DC
  Administered 2019-04-01 – 2019-04-02 (×2): 100 mg via ORAL
  Filled 2019-04-01 (×2): qty 1

## 2019-04-01 MED ORDER — FLUCONAZOLE 50 MG PO TABS
150.0000 mg | ORAL_TABLET | Freq: Once | ORAL | Status: AC
  Administered 2019-04-01: 150 mg via ORAL
  Filled 2019-04-01: qty 1

## 2019-04-01 MED ORDER — BUPROPION HCL ER (SR) 150 MG PO TB12
150.0000 mg | ORAL_TABLET | Freq: Two times a day (BID) | ORAL | Status: DC
  Administered 2019-04-01 – 2019-04-02 (×2): 150 mg via ORAL
  Filled 2019-04-01 (×3): qty 1

## 2019-04-01 MED ORDER — INSULIN REGULAR(HUMAN) IN NACL 100-0.9 UT/100ML-% IV SOLN
INTRAVENOUS | Status: DC
  Administered 2019-04-01: 12:00:00 5 [IU]/h via INTRAVENOUS
  Filled 2019-04-01: qty 100

## 2019-04-01 MED ORDER — SODIUM CHLORIDE 0.9 % IV SOLN: INTRAVENOUS | Status: AC

## 2019-04-01 MED ORDER — SODIUM CHLORIDE 0.9 % IV SOLN
INTRAVENOUS | Status: DC
  Administered 2019-04-01: 16:00:00 via INTRAVENOUS

## 2019-04-01 MED ORDER — DILTIAZEM HCL ER COATED BEADS 120 MG PO CP24
120.0000 mg | ORAL_CAPSULE | Freq: Two times a day (BID) | ORAL | Status: DC
  Filled 2019-04-01: qty 1

## 2019-04-01 MED ORDER — LISINOPRIL 5 MG PO TABS
10.0000 mg | ORAL_TABLET | Freq: Every day | ORAL | Status: DC
  Administered 2019-04-02: 10 mg via ORAL
  Filled 2019-04-01: qty 2

## 2019-04-01 MED ORDER — OXYCODONE-ACETAMINOPHEN 5-325 MG PO TABS: 1.0000 | ORAL_TABLET | Freq: Four times a day (QID) | ORAL | Status: DC | PRN

## 2019-04-01 MED ORDER — FOLIC ACID 1 MG PO TABS
1.0000 mg | ORAL_TABLET | Freq: Every day | ORAL | Status: DC
  Administered 2019-04-02: 10:00:00 1 mg via ORAL
  Filled 2019-04-01: qty 1

## 2019-04-01 MED ORDER — CHLORHEXIDINE GLUCONATE CLOTH 2 % EX PADS
6.0000 | MEDICATED_PAD | Freq: Every day | CUTANEOUS | Status: DC
  Administered 2019-04-01: 6 via TOPICAL

## 2019-04-01 MED ORDER — POTASSIUM CHLORIDE 10 MEQ/100ML IV SOLN
10.0000 meq | INTRAVENOUS | Status: AC
  Administered 2019-04-01 (×2): 10 meq via INTRAVENOUS
  Filled 2019-04-01 (×2): qty 100

## 2019-04-01 MED ORDER — HEPARIN SODIUM (PORCINE) 5000 UNIT/ML IJ SOLN
5000.0000 [IU] | Freq: Three times a day (TID) | INTRAMUSCULAR | Status: DC
  Administered 2019-04-01 – 2019-04-02 (×2): 5000 [IU] via SUBCUTANEOUS
  Filled 2019-04-01 (×2): qty 1

## 2019-04-01 MED ORDER — CLOTRIMAZOLE 2 % VA CREA: 1.0000 | TOPICAL_CREAM | Freq: Every day | VAGINAL | Status: DC

## 2019-04-01 MED ORDER — SENNOSIDES-DOCUSATE SODIUM 8.6-50 MG PO TABS: 1.0000 | ORAL_TABLET | Freq: Every evening | ORAL | Status: DC | PRN

## 2019-04-01 MED ORDER — DEXTROSE-NACL 5-0.45 % IV SOLN
INTRAVENOUS | Status: DC
  Administered 2019-04-01: 17:00:00 via INTRAVENOUS

## 2019-04-01 MED ORDER — CLOTRIMAZOLE 1 % VA CREA
1.0000 | TOPICAL_CREAM | Freq: Every day | VAGINAL | Status: DC
  Administered 2019-04-01: 21:00:00 1 via VAGINAL
  Filled 2019-04-01: qty 45

## 2019-04-01 MED ORDER — ACETAMINOPHEN 325 MG PO TABS
650.0000 mg | ORAL_TABLET | Freq: Four times a day (QID) | ORAL | Status: DC | PRN
  Administered 2019-04-01: 18:00:00 650 mg via ORAL
  Filled 2019-04-01: qty 2

## 2019-04-01 MED ORDER — MORPHINE SULFATE (PF) 4 MG/ML IV SOLN: 4.0000 mg | INTRAVENOUS | Status: DC | PRN

## 2019-04-01 MED ORDER — THIAMINE HCL 100 MG PO TABS
100.0000 mg | ORAL_TABLET | Freq: Every day | ORAL | Status: DC
  Filled 2019-04-01: qty 1

## 2019-04-01 MED ORDER — INSULIN REGULAR(HUMAN) IN NACL 100-0.9 UT/100ML-% IV SOLN
INTRAVENOUS | Status: DC
  Administered 2019-04-01: 4.4 [IU]/h via INTRAVENOUS

## 2019-04-01 MED ORDER — SODIUM CHLORIDE 0.9 % IV SOLN
1000.0000 mL | Freq: Once | INTRAVENOUS | Status: AC
  Administered 2019-04-01: 1000 mL via INTRAVENOUS

## 2019-04-01 NOTE — ED Triage Notes (Signed)
Pt arrived via EMS from home d/t not feeling well for the past few days. Pt reports being type 2 diabetic and having extreme thirst. EMS reports a "high" reading on their glucometer upon arrival. Pt also c/o vaginal bleeding and UTI symptoms. Pt is alert and oriented x4 at this time.

## 2019-04-01 NOTE — ED Provider Notes (Signed)
[facility] Emergency Department Provider Note   ____________________________________________    I have reviewed the triage vital signs and the nursing notes.   HISTORY  Chief Complaint Hyperglycemia     HPI Pamela Lane is a 58 y.o. female who presents with elevated glucose.  Patient has a history of diabetes, hypertension.  Reports feeling exhausted, dehydrated.  Reports polyuria, polydipsia.  Denies cough or shortness of breath.  No fevers or chills reported.  No myalgias.  Reports compliance with medications.  Reports recent admission for hyperglycemia with her being in a coma.  Review of medical records demonstrates multiple admissions for DKA.  Also complains of left leg numbness which is chronic  Past Medical History:  Diagnosis Date  . Asthma   . Clostridium difficile colitis   . Diabetes mellitus without complication ([place])   . Hypertension   . Mental disorder    pt reports 'I have all of them mental disorders'    Patient Active Problem List   Diagnosis Date Noted  . Bipolar disorder ([place]) 01/17/2019  . Acute pancreatitis 05/15/2016  . AKI (acute kidney injury) ([place])   . Diabetic ketoacidosis without coma associated with type 2 diabetes mellitus ([place])   . Hyperglycemia due to type 2 diabetes mellitus ([place]) 01/13/2016  . UTI (urinary tract infection) 01/13/2016  . DKA (diabetic ketoacidoses) ([place]) 01/08/2016  . Hyponatremia 01/08/2016  . Dehydration 01/08/2016  . Urinary tract infection 01/08/2016  . Upper abdominal pain 01/08/2016  . Nausea & vomiting 01/08/2016    Past Surgical History:  Procedure Laterality Date  . ABDOMINAL HYSTERECTOMY      Prior to Admission medications   Medication Sig Start Date End Date Taking? Authorizing Provider  albuterol (PROVENTIL HFA;VENTOLIN HFA) 108 (90 Base) MCG/ACT inhaler Inhale 2 puffs into the lungs every 6 (six) hours as needed for wheezing or shortness of breath. 02/18/18  Yes  Dustin Flock, MD  aspirin EC 81 MG tablet Take 81 mg by mouth daily.   Yes [provider]  blood glucose meter kit and supplies KIT Dispense based on patient and insurance preference. Use up to four times daily as directed. (FOR ICD-9 250.00, 250.01). 01/06/19  Yes Gouru, Illene Silver, MD  blood glucose meter kit and supplies Dispense based on patient and insurance preference. Use up to four times daily as directed. (FOR ICD-10 E10.9, E11.9). 01/18/19  Yes Mayo, Pete Pelt, MD  buPROPion [facility] SR) 150 MG 12 hr tablet Take 150 mg by mouth 2 (two) times daily.   Yes [provider]  diltiazem (CARDIZEM CD) 120 MG 24 hr capsule Take 1 capsule (120 mg total) by mouth 2 (two) times a day. 01/06/19  Yes Gouru, Illene Silver, MD  folic acid (FOLVITE) 1 MG tablet Take 1 tablet (1 mg total) by mouth daily. 03/08/18  Yes Gouru, Illene Silver, MD  gabapentin (NEURONTIN) 100 MG capsule Take 1 capsule (100 mg total) by mouth 2 (two) times daily. 01/06/19  Yes Gouru, Illene Silver, MD  glipiZIDE (GLUCOTROL XL) 5 MG 24 hr tablet Take 1 tablet (5 mg total) by mouth daily with breakfast. 01/06/19 01/07/20 Yes Gouru, Aruna, MD  Insulin Glargine (LANTUS) 100 UNIT/ML Solostar Pen Inject 30 Units into the skin daily. 01/06/19  Yes Gouru, Aruna, MD  Insulin Pen Needle 32G X 5 MM MISC 30 Units by Does not apply route daily. 01/06/19  Yes Gouru, Aruna, MD  ipratropium-albuterol (DUONEB) 0.5-2.5 (3) MG/3ML SOLN Take 3 mLs by nebulization every 6 (six) hours  as needed for wheezing, shortness of breath or cough. 02/23/18  Yes [provider]  lisinopril (PRINIVIL) 10 MG tablet Take 1 tablet (10 mg total) by mouth daily. 02/18/18  Yes Dustin Flock, MD  lurasidone (LATUDA) 40 MG TABS tablet Take 1 tablet (40 mg total) by mouth every evening. 01/18/19  Yes Bettey Costa, MD  metFORMIN (GLUCOPHAGE) 500 MG tablet Take 1 tablet (500 mg total) by mouth 2 (two) times daily with a meal. 01/06/19  Yes Gouru, Aruna, MD  Multiple Vitamin  (MULTIVITAMIN WITH MINERALS) TABS tablet Take 1 tablet by mouth daily. 03/08/18  Yes Gouru, Aruna, MD  NOVOLOG FLEXPEN 100 UNIT/ML FlexPen Inject 5 Units into the skin 3 (three) times daily with meals. 01/06/19  Yes [provider]  senna-docusate (SENOKOT-S) 8.6-50 MG tablet Take 1 tablet by mouth at bedtime as needed for mild constipation. 03/07/18  Yes Gouru, Illene Silver, MD  thiamine 100 MG tablet Take 1 tablet (100 mg total) by mouth daily. 03/08/18  Yes Nicholes Mango, MD     Allergies Patient has no known allergies.  Family History  Problem Relation Age of Onset  . Hypertension Mother     Social History Social History   Tobacco Use  . Smoking status: Current Every Day Smoker    Packs/day: 0.10    Types: Cigarettes  . Smokeless tobacco: Never Used  Substance Use Topics  . Alcohol use: Yes    Alcohol/week: 40.0 standard drinks    Types: 40 Cans of beer per week    Comment: 3 big bottles of liquor  . Drug use: Yes    Types: Marijuana, Cocaine    Review of Systems  Constitutional: Feels weak Eyes: No visual changes.  ENT: No sore throat. Cardiovascular: Denies chest pain. Respiratory: Denies shortness of breath. Gastrointestinal: No abdominal pain.  No nausea, no vomiting.   Genitourinary: As above Musculoskeletal: Negative for back pain. Skin: Negative for rash. Neurological: Negative for headaches    ____________________________________________   PHYSICAL EXAM:  VITAL SIGNS: ED Triage Vitals  Enc Vitals Group     BP 04/01/19 1019 122/83     Pulse Rate 04/01/19 1019 (!) 116     Resp 04/01/19 1019 16     Temp 04/01/19 1019 98.8 F (37.1 C)     Temp Source 04/01/19 1019 Oral     SpO2 04/01/19 1019 99 %     Weight --      Height --      Head Circumference --      Peak Flow --      Pain Score 04/01/19 1020 0     Pain Loc --      Pain Edu? --      Excl. in [place]? --     Constitutional: Alert and oriented.  Eyes: Conjunctivae are normal.   Nose: No  congestion/rhinnorhea. Mouth/Throat: Mucous membranes are moist.   Neck:  Painless ROM Cardiovascular: Tachycardia, regular rhythm. Grossly normal heart sounds.  Good peripheral circulation. Respiratory: Normal respiratory effort.  No retractions. Lungs CTAB. Gastrointestinal: Soft and nontender. No distention.    Musculoskeletal:  Warm and well perfused Neurologic:  Normal speech and language. No gross focal neurologic deficits are appreciated.  Skin:  Skin is warm, dry and intact. No rash noted. Psychiatric: Mood and affect are normal. Speech and behavior are normal.  ____________________________________________   LABS (all labs ordered are listed, but only abnormal results are displayed)  Labs Reviewed  CBC - Abnormal; Notable for the following  components:      Result Value   RBC 5.34 (*)    Hemoglobin 16.3 (*)    HCT 46.7 (*)    All other components within normal limits  COMPREHENSIVE METABOLIC PANEL - Abnormal; Notable for the following components:   Sodium 133 (*)    Chloride 94 (*)    CO2 20 (*)    Glucose, Bld 654 (*)    Creatinine, Ser 1.39 (*)    Total Protein 8.3 (*)    AST 13 (*)    Total Bilirubin 1.4 (*)    GFR calc non Af Amer 42 (*)    GFR calc Af Amer 49 (*)    Anion gap 19 (*)    All other components within normal limits  URINALYSIS, COMPLETE (UACMP) WITH MICROSCOPIC - Abnormal; Notable for the following components:   Color, Urine YELLOW (*)    APPearance HAZY (*)    Glucose, UA >=500 (*)    Ketones, ur 20 (*)    All other components within normal limits  GLUCOSE, CAPILLARY - Abnormal; Notable for the following components:   Glucose-Capillary >600 (*)    All other components within normal limits  GLUCOSE, CAPILLARY - Abnormal; Notable for the following components:   Glucose-Capillary 561 (*)    All other components within normal limits  SARS CORONAVIRUS 2 (TAT 6-24 HRS)  BLOOD GAS, VENOUS   ____________________________________________  EKG  ED  ECG REPORT I, Lavonia Drafts, the attending physician, personally viewed and interpreted this ECG.  Date: 04/01/2019  Rhythm: normal sinus rhythm QRS Axis: normal Intervals: normal ST/T Wave abnormalities: normal Narrative Interpretation: no evidence of acute ischemia  ____________________________________________  RADIOLOGY  Chest x-ray unremarkable ____________________________________________   PROCEDURES  Procedure(s) performed: No  Procedures   Critical Care performed: yes  CRITICAL CARE Performed by: Lavonia Drafts   Total critical care time: 30 minutes  Critical care time was exclusive of separately billable procedures and treating other patients.  Critical care was necessary to treat or prevent imminent or life-threatening deterioration.  Critical care was time spent personally by me on the following activities: development of treatment plan with patient and/or surrogate as well as nursing, discussions with consultants, evaluation of patient's response to treatment, examination of patient, obtaining history from patient or surrogate, ordering and performing treatments and interventions, ordering and review of laboratory studies, ordering and review of radiographic studies, pulse oximetry and re-evaluation of patient's condition.  ____________________________________________   INITIAL IMPRESSION / ASSESSMENT AND PLAN / ED COURSE  Pertinent labs & imaging results that were available during my care of the patient were reviewed by me and considered in my medical decision making (see chart for details).  Patient presents with elevated glucose, fatigue, tachycardia, concerning for DKA.  Pending labs will start IV fluids  Labwork consistent with DKA with elevated glucose of 654 with elevated anion gap.  After IV fluids will start insulin drip and admit to the hospitalist service    ____________________________________________   FINAL CLINICAL IMPRESSION(S) / ED  DIAGNOSES  Final diagnoses:  Diabetic ketoacidosis without coma associated with type 2 diabetes mellitus ([place])        Note:  This document was prepared using Dragon voice recognition software and may include unintentional dictation errors.   Lavonia Drafts, MD 04/01/19 1324

## 2019-04-01 NOTE — ED Notes (Signed)
Pt became verbally abusive towards RN Claretta Fraise who was assisting me set up Pt on glucostabilizer. PT was questioning admitting provider as to why she was being asked the same questions over and over. When RN Anderson Malta intervened on behalf of the provider the PT became irate and demanded RN Anderson Malta not to be her nurse and pt stated "get out of the room". RN Mateo Flow assisted me with setting up the glucostabilizer.

## 2019-04-01 NOTE — Consult Note (Signed)
PULMONARY / CRITICAL CARE MEDICINE  Name: Pamela Lane MRN: [id number] DOB: 11/27/1960    LOS: 0  Referring Provider: Dr. Bridgett Larsson Reason for Referral: DKA  HPI: This is a 58 year old female with a medical history as indicated below who presented to the ED via EMS with complaints of ill health and hyperglycemia.  Blood glucose level was 654 mg/dL.  She was admitted to the ICU for an insulin infusion.  She is now off the insulin drip and doing well.  Patient also complained of vaginal discharge and itching.  She has been started on fluconazole and clotrimazole vaginal cream.  Past Medical History:  Diagnosis Date  . Asthma   . Clostridium difficile colitis   . Diabetes mellitus without complication ([place])   . Hypertension   . Mental disorder    pt reports 'I have all of them mental disorders'   Past Surgical History:  Procedure Laterality Date  . ABDOMINAL HYSTERECTOMY     No current facility-administered medications on file prior to encounter.    Current Outpatient Medications on File Prior to Encounter  Medication Sig  . albuterol (PROVENTIL HFA;VENTOLIN HFA) 108 (90 Base) MCG/ACT inhaler Inhale 2 puffs into the lungs every 6 (six) hours as needed for wheezing or shortness of breath.  Marland Kitchen aspirin EC 81 MG tablet Take 81 mg by mouth daily.  . blood glucose meter kit and supplies KIT Dispense based on patient and insurance preference. Use up to four times daily as directed. (FOR ICD-9 250.00, 250.01).  . blood glucose meter kit and supplies Dispense based on patient and insurance preference. Use up to four times daily as directed. (FOR ICD-10 E10.9, E11.9).  Marland Kitchen buPROPion (WELLBUTRIN SR) 150 MG 12 hr tablet Take 150 mg by mouth 2 (two) times daily.  Marland Kitchen diltiazem (CARDIZEM CD) 120 MG 24 hr capsule Take 1 capsule (120 mg total) by mouth 2 (two) times a day.  . folic acid (FOLVITE) 1 MG tablet Take 1 tablet (1 mg total) by mouth daily.  Marland Kitchen gabapentin (NEURONTIN) 100 MG capsule Take 1  capsule (100 mg total) by mouth 2 (two) times daily.  Marland Kitchen glipiZIDE (GLUCOTROL XL) 5 MG 24 hr tablet Take 1 tablet (5 mg total) by mouth daily with breakfast.  . Insulin Glargine (LANTUS) 100 UNIT/ML Solostar Pen Inject 30 Units into the skin daily.  . Insulin Pen Needle 32G X 5 MM MISC 30 Units by Does not apply route daily.  Marland Kitchen ipratropium-albuterol (DUONEB) 0.5-2.5 (3) MG/3ML SOLN Take 3 mLs by nebulization every 6 (six) hours as needed for wheezing, shortness of breath or cough.  Marland Kitchen lisinopril (PRINIVIL) 10 MG tablet Take 1 tablet (10 mg total) by mouth daily.  Marland Kitchen lurasidone (LATUDA) 40 MG TABS tablet Take 1 tablet (40 mg total) by mouth every evening.  . metFORMIN (GLUCOPHAGE) 500 MG tablet Take 1 tablet (500 mg total) by mouth 2 (two) times daily with a meal.  . Multiple Vitamin (MULTIVITAMIN WITH MINERALS) TABS tablet Take 1 tablet by mouth daily.  Marland Kitchen NOVOLOG FLEXPEN 100 UNIT/ML FlexPen Inject 5 Units into the skin 3 (three) times daily with meals.  . senna-docusate (SENOKOT-S) 8.6-50 MG tablet Take 1 tablet by mouth at bedtime as needed for mild constipation.  . thiamine 100 MG tablet Take 1 tablet (100 mg total) by mouth daily.    Allergies No Known Allergies  Family History Family History  Problem Relation Age of Onset  . Hypertension Mother    Social History  reports  that she has been smoking cigarettes. She has been smoking about 0.10 packs per day. She has never used smokeless tobacco. She reports current alcohol use of about 40.0 standard drinks of alcohol per week. She reports current drug use. Drugs: Marijuana and Cocaine.  Review Of Systems:   Constitutional: Reports generalized malaise HENT: Negative for congestion and rhinorrhea.  Eyes: Negative for redness and visual disturbance.  Respiratory: Negative for shortness of breath and wheezing.  Cardiovascular: Negative for chest pain and palpitations.  Gastrointestinal: Negative  for nausea , vomiting and abdominal pain and   Loose stools Genitourinary: Negative for dysuria and urgency.  Endocrine: Reports polyuria, and polydipsia Musculoskeletal: Negative for myalgias and arthralgias.  Skin: Negative for pallor and wound.  Neurological: Negative for dizziness and headaches   VITAL SIGNS: BP 129/68   Pulse 95   Temp 98 F (36.7 C) (Oral)   Resp 17   Ht 5' 3" (1.6 m)   Wt 87.3 kg   SpO2 96%   BMI 34.09 kg/m   HEMODYNAMICS:    VENTILATOR SETTINGS:    INTAKE / OUTPUT: I/O last 3 completed shifts: In: 1675.9 [P.O.:120; I.V.:1255.9; IV Piggyback:300] Out: -   PHYSICAL EXAMINATION: General: No acute distress HEENT: PERRLA, trachea midline, no JVD Neuro: Alert and oriented x3, no focal deficits Cardiovascular: Apical pulse regular, S1-S2, no murmur regurg or gallop, +2 pulses bilateral Lungs: Clear to auscultation bilaterally Abdomen: Nontender, nondistended, normal bowel sounds in all 4 quadrants, palpation reveals no organomegaly Musculoskeletal: No joint deformities, positive range of motion Skin: Warm and dry  LABS:  BMET Recent Labs  Lab 04/01/19 1035 04/01/19 1919  NA 133* 134*  K 4.6 3.5  CL 94* 103  CO2 20* 18*  BUN 18 16  CREATININE 1.39* 0.88  GLUCOSE 654* 253*    Electrolytes Recent Labs  Lab 04/01/19 1035 04/01/19 1919  CALCIUM 9.7 8.8*    CBC Recent Labs  Lab 04/01/19 1035  WBC 8.2  HGB 16.3*  HCT 46.7*  PLT 299    Coag's No results for input(s): APTT, INR in the last 168 hours.  Sepsis Markers No results for input(s): LATICACIDVEN, PROCALCITON, O2SATVEN in the last 168 hours.  ABG No results for input(s): PHART, PCO2ART, PO2ART in the last 168 hours.  Liver Enzymes Recent Labs  Lab 04/01/19 1035  AST 13*  ALT 17  ALKPHOS 116  BILITOT 1.4*  ALBUMIN 4.5    Cardiac Enzymes No results for input(s): TROPONINI, PROBNP in the last 168 hours.  Glucose Recent Labs  Lab 04/01/19 1535 04/01/19 1638 04/01/19 1741 04/01/19 1854  04/01/19 1955 04/01/19 2058  GLUCAP 282* 125* 186* 231* 246* 185*    Imaging Dg Chest Port 1 View  Result Date: 04/01/2019 CLINICAL DATA:  Weakness, smoker. EXAM: PORTABLE CHEST 1 VIEW COMPARISON:  Chest x-ray dated 01/17/2019. FINDINGS: Heart size and mediastinal contours are within normal limits. Lungs are clear. No pleural effusion or pneumothorax seen. Osseous structures about the chest are unremarkable. IMPRESSION: No active cardiopulmonary disease. No evidence of pneumonia or pulmonary edema. Electronically Signed   By: Franki Cabot M.D.   On: 04/01/2019 10:59    SIGNIFICANT EVENTS: 04/01/2019  LINES/TUBES: PIV's  DISCUSSION: 58 year old female presenting with DKA likely due to medication nonadherence  ASSESSMENT  DKA Vaginal candidiasis Acute renal failure   PLAN DKA protocol Trend renal indices Monitor and correct electrolytes Clotrimazole and fluconazole as ordered  Best Practice: Code Status: Full code Diet: Carb modified GI prophylaxis: Not indicated  VTE prophylaxis: SCDs and subcu heparin  FAMILY  - Updates: Patient updated on current treatment plan.  Will update with any further changes in treatment plan  Pamela Lane ANP-BC Pulmonary and [place] Pager [phone] or [phone]  NB: This document was prepared using Dragon voice recognition software and may include unintentional dictation errors.    04/01/2019, 9:27 PM

## 2019-04-01 NOTE — ED Notes (Signed)
CBG reading "HI." Kim,RN and MD Kinner made aware.

## 2019-04-01 NOTE — H&P (Signed)
[facility] NAME: Pamela Lane    MR#:  [id number]  DATE OF BIRTH:  07-01-1961  DATE OF ADMISSION:  04/01/2019  PRIMARY CARE PHYSICIAN: [facility] [place]   REQUESTING/REFERRING PHYSICIAN: Dr. Corky Downs.  CHIEF COMPLAINT:   Chief Complaint  Patient presents with  . Hyperglycemia   High blood sugar. HISTORY OF PRESENT ILLNESS:  Pamela Lane  is a 58 y.o. female with a known history of medical problems as below.  The patient presents the ED with above chief complaints.  The patient complains of abdominal pain, nausea, vomiting but no diarrhea.  She also complains of polyuria and polydipsia, left leg chronic numbness.  She is not sure she is compliant to her diabetes medication.  She has multiple admissions for DKA.  She is found blood glucose at 654 and anion gap at 19.  Dr. Corky Downs requests admission for DKA. PAST MEDICAL HISTORY:   Past Medical History:  Diagnosis Date  . Asthma   . Clostridium difficile colitis   . Diabetes mellitus without complication ([place])   . Hypertension   . Mental disorder    pt reports 'I have all of them mental disorders'    PAST SURGICAL HISTORY:   Past Surgical History:  Procedure Laterality Date  . ABDOMINAL HYSTERECTOMY      SOCIAL HISTORY:   Social History   Tobacco Use  . Smoking status: Current Every Day Smoker    Packs/day: 0.10    Types: Cigarettes  . Smokeless tobacco: Never Used  Substance Use Topics  . Alcohol use: Yes    Alcohol/week: 40.0 standard drinks    Types: 40 Cans of beer per week    Comment: 3 big bottles of liquor    FAMILY HISTORY:   Family History  Problem Relation Age of Onset  . Hypertension Mother     DRUG ALLERGIES:  No Known Allergies  REVIEW OF SYSTEMS:   Review of Systems  Constitutional: Positive for malaise/fatigue. Negative for chills and fever.  HENT: Negative for sore throat.   Eyes: Negative for blurred  vision and double vision.  Respiratory: Negative for cough, hemoptysis, shortness of breath, wheezing and stridor.   Cardiovascular: Negative for chest pain, palpitations, orthopnea and leg swelling.  Gastrointestinal: Positive for abdominal pain, nausea and vomiting. Negative for blood in stool, diarrhea and melena.  Genitourinary: Negative for dysuria, flank pain and hematuria.  Musculoskeletal: Negative for back pain and joint pain.  Skin: Negative for rash.  Neurological: Negative for dizziness, sensory change, focal weakness, seizures, loss of consciousness, weakness and headaches.  Endo/Heme/Allergies: Positive for polydipsia.  Psychiatric/Behavioral: Negative for depression. The patient is not nervous/anxious.     MEDICATIONS AT HOME:   Prior to Admission medications   Medication Sig Start Date End Date Taking? Authorizing Provider  albuterol (PROVENTIL HFA;VENTOLIN HFA) 108 (90 Base) MCG/ACT inhaler Inhale 2 puffs into the lungs every 6 (six) hours as needed for wheezing or shortness of breath. 02/18/18  Yes Dustin Flock, MD  aspirin EC 81 MG tablet Take 81 mg by mouth daily.   Yes [provider]  blood glucose meter kit and supplies KIT Dispense based on patient and insurance preference. Use up to four times daily as directed. (FOR ICD-9 250.00, 250.01). 01/06/19  Yes Gouru, Illene Silver, MD  blood glucose meter kit and supplies Dispense based on patient and insurance preference. Use up to four times daily as directed. (FOR ICD-10 E10.9, E11.9). 01/18/19  Yes Mayo, Pete Pelt, MD  buPROPion [facility] SR) 150 MG 12 hr tablet Take 150 mg by mouth 2 (two) times daily.   Yes [provider]  diltiazem (CARDIZEM CD) 120 MG 24 hr capsule Take 1 capsule (120 mg total) by mouth 2 (two) times a day. 01/06/19  Yes Gouru, Illene Silver, MD  folic acid (FOLVITE) 1 MG tablet Take 1 tablet (1 mg total) by mouth daily. 03/08/18  Yes Gouru, Illene Silver, MD  gabapentin (NEURONTIN) 100 MG capsule Take 1  capsule (100 mg total) by mouth 2 (two) times daily. 01/06/19  Yes Gouru, Illene Silver, MD  glipiZIDE (GLUCOTROL XL) 5 MG 24 hr tablet Take 1 tablet (5 mg total) by mouth daily with breakfast. 01/06/19 01/07/20 Yes Gouru, Aruna, MD  Insulin Glargine (LANTUS) 100 UNIT/ML Solostar Pen Inject 30 Units into the skin daily. 01/06/19  Yes Gouru, Aruna, MD  Insulin Pen Needle 32G X 5 MM MISC 30 Units by Does not apply route daily. 01/06/19  Yes Gouru, Aruna, MD  ipratropium-albuterol (DUONEB) 0.5-2.5 (3) MG/3ML SOLN Take 3 mLs by nebulization every 6 (six) hours as needed for wheezing, shortness of breath or cough. 02/23/18  Yes [provider]  lisinopril (PRINIVIL) 10 MG tablet Take 1 tablet (10 mg total) by mouth daily. 02/18/18  Yes Dustin Flock, MD  lurasidone (LATUDA) 40 MG TABS tablet Take 1 tablet (40 mg total) by mouth every evening. 01/18/19  Yes Bettey Costa, MD  metFORMIN (GLUCOPHAGE) 500 MG tablet Take 1 tablet (500 mg total) by mouth 2 (two) times daily with a meal. 01/06/19  Yes Gouru, Aruna, MD  Multiple Vitamin (MULTIVITAMIN WITH MINERALS) TABS tablet Take 1 tablet by mouth daily. 03/08/18  Yes Gouru, Aruna, MD  NOVOLOG FLEXPEN 100 UNIT/ML FlexPen Inject 5 Units into the skin 3 (three) times daily with meals. 01/06/19  Yes [provider]  senna-docusate (SENOKOT-S) 8.6-50 MG tablet Take 1 tablet by mouth at bedtime as needed for mild constipation. 03/07/18  Yes Gouru, Illene Silver, MD  thiamine 100 MG tablet Take 1 tablet (100 mg total) by mouth daily. 03/08/18  Yes Gouru, Illene Silver, MD      VITAL SIGNS:  Blood pressure 122/83, pulse (!) 116, temperature 98.8 F (37.1 C), temperature source Oral, resp. rate 16, SpO2 99 %.  PHYSICAL EXAMINATION:  Physical Exam  GENERAL:  58 y.o.-year-old patient lying in the bed with no acute distress.  EYES: Pupils equal, round, reactive to light and accommodation. No scleral icterus. Extraocular muscles intact.  HEENT: Head atraumatic, normocephalic.   NECK:   Supple, no jugular venous distention. No thyroid enlargement, no tenderness.  LUNGS: Normal breath sounds bilaterally, no wheezing, rales,rhonchi or crepitation. No use of accessory muscles of respiration.  CARDIOVASCULAR: S1, S2 normal. No murmurs, rubs, or gallops.  ABDOMEN: Soft, nontender, nondistended. Bowel sounds present. No organomegaly or mass.  EXTREMITIES: No pedal edema, cyanosis, or clubbing.  NEUROLOGIC: Cranial nerves II through XII are intact. Muscle strength 5/5 in all extremities. Sensation intact. Gait not checked.  PSYCHIATRIC: The patient is alert and oriented x 3.  SKIN: No obvious rash, lesion, or ulcer.   LABORATORY PANEL:   CBC Recent Labs  Lab 04/01/19 1035  WBC 8.2  HGB 16.3*  HCT 46.7*  PLT 299   ------------------------------------------------------------------------------------------------------------------  Chemistries  Recent Labs  Lab 04/01/19 1035  NA 133*  K 4.6  CL 94*  CO2 20*  GLUCOSE 654*  BUN 18  CREATININE 1.39*  CALCIUM 9.7  AST 13*  ALT  17  ALKPHOS 116  BILITOT 1.4*   ------------------------------------------------------------------------------------------------------------------  Cardiac Enzymes No results for input(s): TROPONINI in the last 168 hours. ------------------------------------------------------------------------------------------------------------------  RADIOLOGY:  Dg Chest Port 1 View  Result Date: 04/01/2019 CLINICAL DATA:  Weakness, smoker. EXAM: PORTABLE CHEST 1 VIEW COMPARISON:  Chest x-ray dated 01/17/2019. FINDINGS: Heart size and mediastinal contours are within normal limits. Lungs are clear. No pleural effusion or pneumothorax seen. Osseous structures about the chest are unremarkable. IMPRESSION: No active cardiopulmonary disease. No evidence of pneumonia or pulmonary edema. Electronically Signed   By: Franki Cabot M.D.   On: 04/01/2019 10:59      IMPRESSION AND PLAN:   DKA.  History of  diabetes. The patient will be admitted to stepdown unit. Continue insulin drip and ICU DKA protocol.  Acute renal failure due to dehydration secondary to DKA.  IV fluid support and follow-up BMP. Hypertension.  Continue home hypertension medication.  Asthma.  Stable. Tobacco abuse.  Smoking cessation was counseled for 3 to 4 minutes.  Nicotine patch.  All the records are reviewed and case discussed with ED provider. Management plans discussed with the patient, family and they are in agreement.  CODE STATUS: Full code.  TOTAL CRITICAL TIME TAKING CARE OF THIS PATIENT: 56 minutes.    Demetrios Loll M.D on 04/01/2019 at 12:04 PM  Between 7am to 6pm - Pager - [phone]  After 6pm go to [email or web address] - [employer] [place] [facility]  Office  [phone]  CC: Primary care physician; [facility] [place]   Note: This dictation was prepared with Diplomatic Services operational officer dictation along with smaller phrase technology. Any transcriptional errors that result from this process are unin

## 2019-04-02 LAB — BASIC METABOLIC PANEL
Anion gap: 8 (ref 5–15)
BUN: 13 mg/dL (ref 6–20)
CO2: 20 mmol/L — ABNORMAL LOW (ref 22–32)
Calcium: 8.3 mg/dL — ABNORMAL LOW (ref 8.9–10.3)
Chloride: 104 mmol/L (ref 98–111)
Creatinine, Ser: 0.7 mg/dL (ref 0.44–1.00)
GFR calc Af Amer: >60 mL/min (ref >60)
GFR calc non Af Amer: >60 mL/min (ref >60)
Glucose, Bld: 253 mg/dL — ABNORMAL HIGH (ref 70–99)
Potassium: 3.5 mmol/L (ref 3.5–5.1)
Sodium: 132 mmol/L — ABNORMAL LOW (ref 135–145)

## 2019-04-02 LAB — HEMOGLOBIN A1C
Hgb A1c MFr Bld: 13.3 % — ABNORMAL HIGH (ref 4.8–5.6)
Mean Plasma Glucose: 335.01 mg/dL

## 2019-04-02 LAB — GLUCOSE, CAPILLARY
Glucose-Capillary: 155 mg/dL — ABNORMAL HIGH (ref 70–99)
Glucose-Capillary: 166 mg/dL — ABNORMAL HIGH (ref 70–99)
Glucose-Capillary: 189 mg/dL — ABNORMAL HIGH (ref 70–99)
Glucose-Capillary: 208 mg/dL — ABNORMAL HIGH (ref 70–99)
Glucose-Capillary: 297 mg/dL — ABNORMAL HIGH (ref 70–99)
Glucose-Capillary: 367 mg/dL — ABNORMAL HIGH (ref 70–99)

## 2019-04-02 LAB — PHOSPHORUS: Phosphorus: 3.7 mg/dL (ref 2.5–4.6)

## 2019-04-02 LAB — MAGNESIUM: Magnesium: 1.7 mg/dL (ref 1.7–2.4)

## 2019-04-02 MED ORDER — INSULIN ASPART 100 UNIT/ML ~~LOC~~ SOLN
4.0000 [IU] | Freq: Three times a day (TID) | SUBCUTANEOUS | Status: DC
  Administered 2019-04-02: 12:00:00 4 [IU] via SUBCUTANEOUS
  Filled 2019-04-02: qty 1

## 2019-04-02 MED ORDER — POTASSIUM CHLORIDE CRYS ER 20 MEQ PO TBCR
40.0000 meq | EXTENDED_RELEASE_TABLET | Freq: Once | ORAL | Status: AC
  Administered 2019-04-02: 40 meq via ORAL
  Filled 2019-04-02: qty 2

## 2019-04-02 MED ORDER — INSULIN ASPART 100 UNIT/ML ~~LOC~~ SOLN
0.0000 [IU] | SUBCUTANEOUS | Status: DC
  Administered 2019-04-02: 03:00:00 5 [IU] via SUBCUTANEOUS
  Administered 2019-04-02: 15 [IU] via SUBCUTANEOUS
  Administered 2019-04-02: 8 [IU] via SUBCUTANEOUS
  Filled 2019-04-02 (×3): qty 1

## 2019-04-02 MED ORDER — INSULIN GLARGINE 100 UNIT/ML ~~LOC~~ SOLN
25.0000 [IU] | Freq: Every day | SUBCUTANEOUS | Status: DC
  Filled 2019-04-02: qty 0.25

## 2019-04-02 MED ORDER — LACTATED RINGERS IV SOLN
INTRAVENOUS | Status: DC
  Administered 2019-04-02: 02:00:00 via INTRAVENOUS

## 2019-04-02 MED ORDER — INSULIN GLARGINE 100 UNIT/ML ~~LOC~~ SOLN
10.0000 [IU] | SUBCUTANEOUS | Status: AC
  Administered 2019-04-02: 12:00:00 10 [IU] via SUBCUTANEOUS
  Filled 2019-04-02: qty 0.1

## 2019-04-02 MED ORDER — INSULIN GLARGINE 100 UNIT/ML ~~LOC~~ SOLN
15.0000 [IU] | Freq: Every day | SUBCUTANEOUS | Status: DC
  Administered 2019-04-02: 01:00:00 15 [IU] via SUBCUTANEOUS
  Filled 2019-04-02 (×2): qty 0.15

## 2019-04-02 MED ORDER — INSULIN REGULAR(HUMAN) IN NACL 100-0.9 UT/100ML-% IV SOLN: INTRAVENOUS | Status: AC

## 2019-04-02 NOTE — Progress Notes (Signed)
[facility] - [place] [facility]     PATIENT NAME: Pamela Lane    MR#:  [id number]  DATE OF BIRTH:  06/18/1961  SUBJECTIVE:   Patient admitted secondary to uncontrolled hyperglycemia noted to be in acute diabetic ketoacidosis.  Much improved today.  Weaned off the insulin drip.  REVIEW OF SYSTEMS:    Review of Systems  Constitutional: Negative for chills and fever.  HENT: Negative for congestion and tinnitus.   Eyes: Negative for blurred vision and double vision.  Respiratory: Negative for cough, shortness of breath and wheezing.   Cardiovascular: Negative for chest pain, orthopnea and PND.  Gastrointestinal: Negative for abdominal pain, diarrhea, nausea and vomiting.  Genitourinary: Negative for dysuria and hematuria.  Neurological: Negative for dizziness, sensory change and focal weakness.  All other systems reviewed and are negative.   Nutrition: Carb control Tolerating Diet: Yes Tolerating PT: Ambulatory  DRUG ALLERGIES:  No Known Allergies  VITALS:  Blood pressure 140/61, pulse 87, temperature 98.3 F (36.8 C), temperature source Oral, resp. rate 16, height 5\' 3"  (1.6 m), weight 87.3 kg, SpO2 98 %.  PHYSICAL EXAMINATION:   Physical Exam  GENERAL:  58 y.o.-year-old patient lying in bed in no acute distress.  EYES: Pupils equal, round, reactive to light and accommodation. No scleral icterus. Extraocular muscles intact.  HEENT: Head atraumatic, normocephalic. Oropharynx and nasopharynx clear.  NECK:  Supple, no jugular venous distention. No thyroid enlargement, no tenderness.  LUNGS: Normal breath sounds bilaterally, no wheezing, rales, rhonchi. No use of accessory muscles of respiration.  CARDIOVASCULAR: S1, S2 normal. No murmurs, rubs, or gallops.  ABDOMEN: Soft, nontender, nondistended. Bowel sounds present. No organomegaly or mass.  EXTREMITIES: No cyanosis, clubbing or edema b/l.    NEUROLOGIC: Cranial nerves II through XII are intact.  No focal Motor or sensory deficits b/l.   PSYCHIATRIC: The patient is alert and oriented x 3. Agitated.   SKIN: No obvious rash, lesion, or ulcer.    LABORATORY PANEL:   CBC Recent Labs  Lab 04/01/19 1035  WBC 8.2  HGB 16.3*  HCT 46.7*  PLT 299   ------------------------------------------------------------------------------------------------------------------  Chemistries  Recent Labs  Lab 04/01/19 1035  04/02/19 0456  NA 133*   < > 132*  K 4.6   < > 3.5  CL 94*   < > 104  CO2 20*   < > 20*  GLUCOSE 654*   < > 253*  BUN 18   < > 13  CREATININE 1.39*   < > 0.70  CALCIUM 9.7   < > 8.3*  MG  --   --  1.7  AST 13*  --   --   ALT 17  --   --   ALKPHOS 116  --   --   BILITOT 1.4*  --   --    < > = values in this interval not displayed.   ------------------------------------------------------------------------------------------------------------------  Cardiac Enzymes No results for input(s): TROPONINI in the last 168 hours. ------------------------------------------------------------------------------------------------------------------  RADIOLOGY:  Dg Chest Port 1 View  Result Date: 04/01/2019 CLINICAL DATA:  Weakness, smoker. EXAM: PORTABLE CHEST 1 VIEW COMPARISON:  Chest x-ray dated 01/17/2019. FINDINGS: Heart size and mediastinal contours are within normal limits. Lungs are clear. No pleural effusion or pneumothorax seen. Osseous structures about the chest are unremarkable. IMPRESSION: No active cardiopulmonary disease. No evidence of pneumonia or pulmonary edema. Electronically Signed   By: 03/20/2019 M.D.   On: 04/01/2019 10:59     ASSESSMENT  AND PLAN:   58 year old female with past medical history of diabetes, hypertension, asthma, history of C. difficile, diabetic neuropathy, depression who presented to the hospital due to hyperglycemia noted to be in acute diabetic ketoacidosis.  1.  Acute DKA-this is secondary to patient's noncompliance.  Patient's A1c  was as high as 13. -Patient although claims that she is compliant with her insulin but then says she ran out of her insulin 1 month ago. - DKA much improved now off insulin drip, anion gap closed.  Patient has been transitioned to glargine insulin and sliding scale.  Patient is tolerating p.o. well.  No nausea or vomiting.  2.  Essential hypertension-continue Cardizem, lisinopril.    3.  Anxiety/depression-continue Wellbutrin, Latuda.  4.  Diabetic neuropathy-continue gabapentin.  All the records are reviewed and case discussed with Care Management/Social Worker. Management plans discussed with the patient, family and they are in agreement.  CODE STATUS: Full code  DVT Prophylaxis: Lovenox  TOTAL TIME TAKING CARE OF THIS PATIENT: 30 minutes.   POSSIBLE D/C IN 1-2 DAYS, DEPENDING ON CLINICAL CONDITION.   Henreitta Leber M.D on 04/02/2019 at 3:27 PM  Between 7am to 6pm - Pager - [phone]  After 6pm go to [email or web address] - [employer] [facility]  Office  [phone]  CC: Primary care physician; [facility] [place]

## 2019-04-02 NOTE — Progress Notes (Signed)
Patient did not want to wait any longer on discharge. Yelled multiple comments to nurses saying "she is not the one to mess with". Patient left AMA, paperwork signed. Dr. Verdell Carmine notified. Lupita Leash

## 2019-04-02 NOTE — Progress Notes (Signed)
CRITICAL CARE PROGRESS NOTE    Name: Pamela Lane MRN: [id number] DOB: July 27, 1960     LOS: 1   SUBJECTIVE FINDINGS & SIGNIFICANT EVENTS   Patient description:   This is a 58 year old female with a medical history as indicated below who presented to the ED via EMS with complaints of ill health and hyperglycemia.  Blood glucose level was 654 mg/dL.  She was admitted to the ICU for an insulin infusion for diabetic ketoacidosis  Patient also complained of vaginal discharge and itching.    PAST MEDICAL HISTORY   Past Medical History:  Diagnosis Date  . Asthma   . Clostridium difficile colitis   . Diabetes mellitus without complication (HCC)   . Hypertension   . Mental disorder    pt reports 'I have all of them mental disorders'     SURGICAL HISTORY   Past Surgical History:  Procedure Laterality Date  . ABDOMINAL HYSTERECTOMY       FAMILY HISTORY   Family History  Problem Relation Age of Onset  . Hypertension Mother      SOCIAL HISTORY   Social History   Tobacco Use  . Smoking status: Current Every Day Smoker    Packs/day: 0.10    Types: Cigarettes  . Smokeless tobacco: Never Used  Substance Use Topics  . Alcohol use: Yes    Alcohol/week: 40.0 standard drinks    Types: 40 Cans of beer per week    Comment: 3 big bottles of liquor  . Drug use: Yes    Types: Marijuana, Cocaine     MEDICATIONS   Current Medication:  Current Facility-Administered Medications:  .  0.9 %  sodium chloride infusion, , Intravenous, Continuous, Shaune Pollack, MD, Stopped at 04/01/19 1652 .  acetaminophen (TYLENOL) tablet 650 mg, 650 mg, Oral, Q6H PRN, Salena Saner, MD, 650 mg at 04/01/19 1806 .  aspirin EC tablet 81 mg, 81 mg, Oral, Daily, Shaune Pollack, MD, 81 mg at 04/01/19 1650 .  bisacodyl  (DULCOLAX) EC tablet 5 mg, 5 mg, Oral, Daily PRN, Shaune Pollack, MD .  buPROPion [facility] SR) 12 hr tablet 150 mg, 150 mg, Oral, BID, Shaune Pollack, MD, 150 mg at 04/01/19 2101 .  Chlorhexidine Gluconate Cloth 2 % PADS 6 each, 6 each, Topical, Daily, Salena Saner, MD, 6 each at 04/01/19 1600 .  clotrimazole (GYNE-LOTRIMIN) vaginal cream 1 Applicatorful, 1 Applicatorful, Vaginal, QHS, Shaune Pollack, MD, 1 Applicatorful at 04/01/19 2101 .  dextrose 5 %-0.45 % sodium chloride infusion, , Intravenous, Continuous, Shaune Pollack, MD, Stopped at 04/02/19 0136 .  diltiazem (CARDIZEM CD) 24 hr capsule 120 mg, 120 mg, Oral, BID, Shaune Pollack, MD, 120 mg at 04/01/19 1650 .  folic acid (FOLVITE) tablet 1 mg, 1 mg, Oral, Daily, Shaune Pollack, MD .  gabapentin (NEURONTIN) capsule 100 mg, 100 mg, Oral, BID, Shaune Pollack, MD, 100 mg at 04/01/19 2100 .  heparin injection 5,000 Units, 5,000 Units, Subcutaneous, Q8H, Shaune Pollack, MD, 5,000 Units at 04/02/19 0606 .  insulin aspart (novoLOG) injection 0-15 Units, 0-15 Units, Subcutaneous, Q4H, Tukov-Yual, Magdalene S, NP, 5 Units at 04/02/19 0320 .  insulin glargine (LANTUS) injection 15 Units, 15 Units, Subcutaneous, QHS, Tukov-Yual, Magdalene S, NP, 15 Units at 04/02/19 0042 .  lactated ringers infusion, , Intravenous, Continuous, Tukov-Yual, Magdalene S, NP, Last Rate: 100 mL/hr at 04/02/19 0600 .  lisinopril (ZESTRIL) tablet 10 mg, 10 mg, Oral, Daily, Shaune Pollack, MD .  lurasidone (LATUDA) tablet 40 mg, 40 mg,  Oral, Rolm Gala, MD, 40 mg at 04/01/19 1810 .  morphine 4 MG/ML injection 4 mg, 4 mg, Intravenous, Q4H PRN, Shaune Pollack, MD .  nicotine (NICODERM CQ - dosed in mg/24 hours) patch 14 mg, 14 mg, Transdermal, Daily, Shaune Pollack, MD .  ondansetron [facility]) injection 4 mg, 4 mg, Intravenous, Q6H PRN, Shaune Pollack, MD .  oxyCODONE-acetaminophen (PERCOCET/ROXICET) 5-325 MG per tablet 1 tablet, 1 tablet, Oral, Q6H PRN, Shaune Pollack, MD .  senna-docusate (Senokot-S) tablet 1  tablet, 1 tablet, Oral, QHS PRN, Shaune Pollack, MD .  thiamine (VITAMIN B-1) tablet 100 mg, 100 mg, Oral, Daily, Shaune Pollack, MD    ALLERGIES   Patient has no known allergies.    REVIEW OF SYSTEMS    10 point ROS done and is negative except as per HPI  PHYSICAL EXAMINATION   Vital Signs: Temp:  [98 F (36.7 C)-98.8 F (37.1 C)] 98.2 F (36.8 C) (09/21 0300) Pulse Rate:  [86-116] 89 (09/21 0600) Resp:  [10-25] 22 (09/21 0600) BP: (112-157)/(68-93) 139/78 (09/21 0500) SpO2:  [94 %-100 %] 97 % (09/21 0600) Weight:  [87.3 kg] 87.3 kg (09/20 1537)  GENERAL:NAD HEAD: Normocephalic, atraumatic.  EYES: Pupils equal, round, reactive to light.  No scleral icterus.  MOUTH: Moist mucosal membrane. NECK: Supple. No thyromegaly. No nodules. No JVD.  PULMONARY: ctab CARDIOVASCULAR: S1 and S2. Regular rate and rhythm. No murmurs, rubs, or gallops.  GASTROINTESTINAL: Soft, nontender, non-distended. No masses. Positive bowel sounds. No hepatosplenomegaly.  MUSCULOSKELETAL: No swelling, clubbing, or edema.  NEUROLOGIC: Mild distress due to acute illness SKIN:intact,warm,dry   PERTINENT DATA     Infusions: . sodium chloride Stopped (04/01/19 1652)  . dextrose 5 % and 0.45% NaCl Stopped (04/02/19 0136)  . lactated ringers 100 mL/hr at 04/02/19 0600   Scheduled Medications: . aspirin EC  81 mg Oral Daily  . buPROPion  150 mg Oral BID  . Chlorhexidine Gluconate Cloth  6 each Topical Daily  . clotrimazole  1 Applicatorful Vaginal QHS  . diltiazem  120 mg Oral BID  . folic acid  1 mg Oral Daily  . gabapentin  100 mg Oral BID  . heparin  5,000 Units Subcutaneous Q8H  . insulin aspart  0-15 Units Subcutaneous Q4H  . insulin glargine  15 Units Subcutaneous QHS  . lisinopril  10 mg Oral Daily  . lurasidone  40 mg Oral QPM  . nicotine  14 mg Transdermal Daily  . thiamine  100 mg Oral Daily   PRN Medications: acetaminophen, bisacodyl, morphine injection, ondansetron (ZOFRAN) IV,  oxyCODONE-acetaminophen, senna-docusate Hemodynamic parameters:   Intake/Output: 09/20 0701 - 09/21 0700 In: 3267.4 [P.O.:120; I.V.:2638.1; IV Piggyback:509.3] Out: 250 [Urine:250]  Ventilator  Settings:       LAB RESULTS:  Basic Metabolic Panel: Recent Labs  Lab 04/01/19 1035 04/01/19 1919 04/01/19 2331 04/02/19 0456  NA 133* 134* 135 132*  K 4.6 3.5 3.3* 3.5  CL 94* 103 104 104  CO2 20* 18* 22 20*  GLUCOSE 654* 253* 143* 253*  BUN 18 16 16 13   CREATININE 1.39* 0.88 0.73 0.70  CALCIUM 9.7 8.8* 8.5* 8.3*  MG  --   --   --  1.7  PHOS  --   --   --  3.7   Liver Function Tests: Recent Labs  Lab 04/01/19 1035  AST 13*  ALT 17  ALKPHOS 116  BILITOT 1.4*  PROT 8.3*  ALBUMIN 4.5   No results for input(s): LIPASE, AMYLASE in the  last 168 hours. No results for input(s): AMMONIA in the last 168 hours. CBC: Recent Labs  Lab 04/01/19 1035  WBC 8.2  HGB 16.3*  HCT 46.7*  MCV 87.5  PLT 299   Cardiac Enzymes: No results for input(s): CKTOTAL, CKMB, CKMBINDEX, TROPONINI in the last 168 hours. BNP: Invalid input(s): POCBNP CBG: Recent Labs  Lab 04/01/19 2302 04/02/19 0003 04/02/19 0101 04/02/19 0204 04/02/19 0311  GLUCAP 114* 155* 166* 189* 208*     IMAGING RESULTS:  Imaging: Dg Chest Port 1 View  Result Date: 04/01/2019 CLINICAL DATA:  Weakness, smoker. EXAM: PORTABLE CHEST 1 VIEW COMPARISON:  Chest x-ray dated 01/17/2019. FINDINGS: Heart size and mediastinal contours are within normal limits. Lungs are clear. No pleural effusion or pneumothorax seen. Osseous structures about the chest are unremarkable. IMPRESSION: No active cardiopulmonary disease. No evidence of pneumonia or pulmonary edema. Electronically Signed   By: Franki Cabot M.D.   On: 04/01/2019 10:59   @PROBHOSP @   ASSESSMENT AND PLAN    -Multidisciplinary rounds held today  ASSESSMENT  DKA Vaginal candidiasis Acute renal failure   PLAN DKA protocol Trend renal indices  Monitor and correct electrolytes Clotrimazole and fluconazole as ordered  Best Practice: Code Status: Full code Diet: Carb modified GI prophylaxis: Not indicated VTE prophylaxis: SCDs and subcu heparin    GI/Nutrition GI PROPHYLAXIS as indicated DIET-->TF's as tolerated Constipation protocol as indicated  ENDO - ICU hypoglycemic\Hyperglycemia protocol -check FSBS per protocol   ELECTROLYTES -follow labs as needed -replace as needed -pharmacy consultation   DVT/GI PRX ordered -SCDs  TRANSFUSIONS AS NEEDED MONITOR FSBS ASSESS the need for LABS as needed   Critical care provider statement:    Critical care time (minutes):  32   Critical care time was exclusive of:  Separately billable procedures and treating other patients   Critical care was necessary to treat or prevent imminent or life-threatening deterioration of the following conditions:  DKA, multiple comorbid conditions.    Critical care was time spent personally by me on the following activities:  Development of treatment plan with patient or surrogate, discussions with consultants, evaluation of patient's response to treatment, examination of patient, obtaining history from patient or surrogate, ordering and performing treatments and interventions, ordering and review of laboratory studies and re-evaluation of patient's condition.  I assumed direction of critical care for this patient from another provider in my specialty: no    This document was prepared using Dragon voice recognition software and may include unintentional dictation errors.    Ottie Glazier, M.D.  Division of [place]

## 2019-04-02 NOTE — Progress Notes (Signed)
Inpatient Diabetes Program Recommendations  AACE/ADA: New Consensus Statement on Inpatient Glycemic Control (2015)  Target Ranges:  Prepandial:   less than 140 mg/dL      Peak postprandial:   less than 180 mg/dL (1-2 hours)      Critically ill patients:  140 - 180 mg/dL   Lab Results  Component Value Date   GLUCAP 297 (H) 04/02/2019   HGBA1C 13.2 (H) 04/01/2019    Review of Glycemic Control  Diabetes history: DM2 Outpatient Diabetes medications: Lantus 30 units + Novolog 5 units tid meal coverage + Glucotrol 5 mg qd + Metformin 500 mg bid Current orders for Inpatient glycemic control: Lantus 15 units + Novolog moderate correction q 4 hrs.  Inpatient Diabetes Program Recommendations:   -Increase Lantus to 25 units (give additional 10 units this am) -Add Novolog 4 units tid meal coverage when eating 50% meals  Noted patient was discharged 01/18/19 and has had multiple admissions with hyperglycemia. Hgb A1c has decreased from 14.2 12/26/18 to currently 13.2. Will plan to speak with patient during hospitalization.  Thank you, Nani Gasser. Arelia Volpe, RN, MSN, CDE  Diabetes Coordinator Inpatient Glycemic Control Team Team Pager [phone] (8am-5pm) 04/02/2019 9:38 AM

## 2019-04-03 LAB — HIV ANTIBODY (ROUTINE TESTING W REFLEX): HIV Screen 4th Generation wRfx: NONREACTIVE

## 2019-04-03 NOTE — Discharge Summary (Signed)
[place] [facility] [place] NAME: Pamela Lane    MR#:  [id number]  DATE OF BIRTH:  [birth year]  DATE OF ADMISSION:  04/01/2019 ADMITTING PHYSICIAN: Demetrios Loll, MD  DATE OF DISCHARGE: 04/02/2019 12:46 PM  PRIMARY CARE PHYSICIAN: [facility] [place]    ADMISSION DIAGNOSIS:  Diabetic ketoacidosis without coma associated with type 2 diabetes mellitus ([place]) [E11.10]  DISCHARGE DIAGNOSIS:  Active Problems:   DKA (diabetic ketoacidoses) ([place])   SECONDARY DIAGNOSIS:   Past Medical History:  Diagnosis Date  . Asthma   . Clostridium difficile colitis   . Diabetes mellitus without complication ([place])   . Hypertension   . Mental disorder    pt reports 'I have all of them mental disorders'    HOSPITAL COURSE:   58 year old female with past medical history of diabetes, hypertension, asthma, history of C. difficile, diabetic neuropathy, depression who presented to the hospital due to hyperglycemia noted to be in acute diabetic ketoacidosis.  1.  Acute DKA-this is secondary to patient's noncompliance.  Patient's A1c was as high as 13. -Patient although claims that she is compliant with her insulin but then says she ran out of her insulin 1 month ago. - DKA much improved now off insulin drip, anion gap closed.  Patient has been transitioned to glargine insulin and sliding scale.  Patient is tolerating p.o. well.  No nausea or vomiting.  2.  Essential hypertension-continue Cardizem, lisinopril.    3.  Anxiety/depression-continue Wellbutrin, Latuda.  4.  Diabetic neuropathy-continue gabapentin.  Patient was being treated as mentioned above and was awaiting social work to help her with her medications but patient did not want to wait and left [place] in the morning/afternoon of April 02, 2019.   DISCHARGE CONDITIONS:   Stable.   CONSULTS OBTAINED:    DRUG ALLERGIES:  No Known Allergies  DISCHARGE  MEDICATIONS:   Allergies as of 04/02/2019   No Known Allergies     Medication List    ASK your doctor about these medications   albuterol 108 (90 Base) MCG/ACT inhaler Commonly known as: VENTOLIN HFA Inhale 2 puffs into the lungs every 6 (six) hours as needed for wheezing or shortness of breath.   aspirin EC 81 MG tablet Take 81 mg by mouth daily.   blood glucose meter kit and supplies Dispense based on patient and insurance preference. Use up to four times daily as directed. (FOR ICD-10 E10.9, E11.9).   blood glucose meter kit and supplies Kit Dispense based on patient and insurance preference. Use up to four times daily as directed. (FOR ICD-9 250.00, 250.01).   buPROPion 150 MG 12 hr tablet Commonly known as: WELLBUTRIN SR Take 150 mg by mouth 2 (two) times daily.   diltiazem 120 MG 24 hr capsule Commonly known as: CARDIZEM CD Take 1 capsule (120 mg total) by mouth 2 (two) times a day.   folic acid 1 MG tablet Commonly known as: FOLVITE Take 1 tablet (1 mg total) by mouth daily.   gabapentin 100 MG capsule Commonly known as: NEURONTIN Take 1 capsule (100 mg total) by mouth 2 (two) times daily.   glipiZIDE 5 MG 24 hr tablet Commonly known as: GLUCOTROL XL Take 1 tablet (5 mg total) by mouth daily with breakfast.   Insulin Glargine 100 UNIT/ML Solostar Pen Commonly known as: LANTUS Inject 30 Units into the skin daily.   Insulin Pen Needle 32G X 5 MM Misc 30 Units by Does  not apply route daily.   ipratropium-albuterol 0.5-2.5 (3) MG/3ML Soln Commonly known as: DUONEB Take 3 mLs by nebulization every 6 (six) hours as needed for wheezing, shortness of breath or cough.   lisinopril 10 MG tablet Commonly known as: Prinivil Take 1 tablet (10 mg total) by mouth daily.   lurasidone 40 MG Tabs tablet Commonly known as: LATUDA Take 1 tablet (40 mg total) by mouth every evening.   metFORMIN 500 MG tablet Commonly known as: GLUCOPHAGE Take 1 tablet (500 mg total) by  mouth 2 (two) times daily with a meal.   multivitamin with minerals Tabs tablet Take 1 tablet by mouth daily.   NovoLOG FlexPen 100 UNIT/ML FlexPen Generic drug: insulin aspart Inject 5 Units into the skin 3 (three) times daily with meals.   senna-docusate 8.6-50 MG tablet Commonly known as: Senokot-S Take 1 tablet by mouth at bedtime as needed for mild constipation.   thiamine 100 MG tablet Take 1 tablet (100 mg total) by mouth daily.         DISCHARGE INSTRUCTIONS:   DIET:  Cardiac diet and Diabetic diet  DISCHARGE CONDITION:  Stable  ACTIVITY:  Activity as tolerated  OXYGEN:  Home Oxygen: No.   Oxygen Delivery: room air  DISCHARGE LOCATION:  home   If you experience worsening of your admission symptoms, develop shortness of breath, life threatening emergency, suicidal or homicidal thoughts you must seek medical attention immediately by calling 911 or calling your MD immediately  if symptoms less severe.  You Must read complete instructions/literature along with all the possible adverse reactions/side effects for all the Medicines you take and that have been prescribed to you. Take any new Medicines after you have completely understood and accpet all the possible adverse reactions/side effects.   Please note  You were cared for by a hospitalist during your hospital stay. If you have any questions about your discharge medications or the care you received while you were in the hospital after you are discharged, you can call the unit and asked to speak with the hospitalist on call if the hospitalist that took care of you is not available. Once you are discharged, your primary care physician will handle any further medical issues. Please note that NO REFILLS for any discharge medications will be authorized once you are discharged, as it is imperative that you return to your primary care physician (or establish a relationship with a primary care physician if you do not have  one) for your aftercare needs so that they can reassess your need for medications and monitor your lab values.    DATA REVIEW:   CBC Recent Labs  Lab 04/01/19 1035  WBC 8.2  HGB 16.3*  HCT 46.7*  PLT 299    Chemistries  Recent Labs  Lab 04/01/19 1035  04/02/19 0456  NA 133*   < > 132*  K 4.6   < > 3.5  CL 94*   < > 104  CO2 20*   < > 20*  GLUCOSE 654*   < > 253*  BUN 18   < > 13  CREATININE 1.39*   < > 0.70  CALCIUM 9.7   < > 8.3*  MG  --   --  1.7  AST 13*  --   --   ALT 17  --   --   ALKPHOS 116  --   --   BILITOT 1.4*  --   --    < > = values in  this interval not displayed.    Cardiac Enzymes No results for input(s): TROPONINI in the last 168 hours.  Microbiology Results  Results for orders placed or performed during the hospital encounter of 04/01/19  SARS CORONAVIRUS 2 (TAT 6-24 HRS) Nasopharyngeal     Status: None   Collection Time: 04/01/19 11:39 AM   Specimen: Nasopharyngeal  Result Value Ref Range Status   SARS Coronavirus 2 NEGATIVE NEGATIVE Final    Comment: (NOTE) SARS-CoV-2 target nucleic acids are NOT DETECTED. The SARS-CoV-2 RNA is generally detectable in upper and lower respiratory specimens during the acute phase of infection. Negative results do not preclude SARS-CoV-2 infection, do not rule out co-infections with other pathogens, and should not be used as the sole basis for treatment or other patient management decisions. Negative results must be combined with clinical observations, patient history, and epidemiological information. The expected result is Negative. Fact Sheet for Patients: SugarRoll.be Fact Sheet for Healthcare Providers: https://www.woods-mathews.com/ This test is not yet approved or cleared by the [place] FDA and  has been authorized for detection and/or diagnosis of SARS-CoV-2 by FDA under an Emergency Use Authorization (EUA). This EUA will remain  in effect (meaning  this test can be used) for the duration of the COVID-19 declaration under Section 56 4(b)(1) of the Act, 21 U.S.C. section 360bbb-3(b)(1), unless the authorization is terminated or revoked sooner. Performed at [facility], [place] [address], [place], [place] [address]   MRSA PCR Screening     Status: None   Collection Time: 04/01/19  3:35 PM   Specimen: Nasal Mucosa; Nasopharyngeal  Result Value Ref Range Status   MRSA by PCR NEGATIVE NEGATIVE Final    Comment:        The GeneXpert MRSA Assay (FDA approved for NASAL specimens only), is one component of a comprehensive MRSA colonization surveillance program. It is not intended to diagnose MRSA infection nor to guide or monitor treatment for MRSA infections. Performed at [facility], [address], [place], [place] [address]     RADIOLOGY:  No results found.    Management plans discussed with the patient, family and they are in agreement.  CODE STATUS:  Code Status History    Date Active Date Inactive Code Status Order ID Comments User Context   04/01/2019 1610 04/02/2019 1701 Full Code [id number]  Demetrios Loll, MD Inpatient    TOTAL TIME TAKING CARE OF THIS PATIENT: 40 minutes.    Henreitta Leber M.D on 04/03/2019 at 3:02 PM  Between 7am to 6pm - Pager - [phone]  After 6pm go to [email or web address] - [facility]  Office  [phone]  CC: Primary care physician; [facility] [place]

## 2019-05-24 ENCOUNTER — Emergency Department: Payer: Medicaid Other

## 2019-05-24 ENCOUNTER — Other Ambulatory Visit: Payer: Self-pay

## 2019-05-24 ENCOUNTER — Encounter: Payer: Self-pay | Admitting: Emergency Medicine

## 2019-05-24 ENCOUNTER — Inpatient Hospital Stay
Admission: EM | Admit: 2019-05-24 | Discharge: 2019-05-26 | DRG: 638 | Disposition: A | Discharge status: Home / Self Care | Payer: Medicaid Other | Attending: Internal Medicine | Admitting: Internal Medicine

## 2019-05-24 DIAGNOSIS — E871 Hypo-osmolality and hyponatremia: Secondary | ICD-10-CM | POA: Diagnosis present

## 2019-05-24 DIAGNOSIS — G8929 Other chronic pain: Secondary | ICD-10-CM | POA: Diagnosis present

## 2019-05-24 DIAGNOSIS — R296 Repeated falls: Secondary | ICD-10-CM | POA: Diagnosis present

## 2019-05-24 DIAGNOSIS — F1490 Cocaine use, unspecified, uncomplicated: Secondary | ICD-10-CM | POA: Diagnosis present

## 2019-05-24 DIAGNOSIS — F1290 Cannabis use, unspecified, uncomplicated: Secondary | ICD-10-CM | POA: Diagnosis present

## 2019-05-24 DIAGNOSIS — Z9114 Patient's other noncompliance with medication regimen: Secondary | ICD-10-CM

## 2019-05-24 DIAGNOSIS — Z20828 Contact with and (suspected) exposure to other viral communicable diseases: Secondary | ICD-10-CM | POA: Diagnosis present

## 2019-05-24 DIAGNOSIS — Z794 Long term (current) use of insulin: Secondary | ICD-10-CM

## 2019-05-24 DIAGNOSIS — N179 Acute kidney failure, unspecified: Secondary | ICD-10-CM | POA: Diagnosis present

## 2019-05-24 DIAGNOSIS — E860 Dehydration: Secondary | ICD-10-CM | POA: Diagnosis present

## 2019-05-24 DIAGNOSIS — I10 Essential (primary) hypertension: Secondary | ICD-10-CM | POA: Diagnosis present

## 2019-05-24 DIAGNOSIS — Z23 Encounter for immunization: Secondary | ICD-10-CM

## 2019-05-24 DIAGNOSIS — Z8249 Family history of ischemic heart disease and other diseases of the circulatory system: Secondary | ICD-10-CM | POA: Diagnosis not present

## 2019-05-24 DIAGNOSIS — R05 Cough: Secondary | ICD-10-CM

## 2019-05-24 DIAGNOSIS — F319 Bipolar disorder, unspecified: Secondary | ICD-10-CM | POA: Diagnosis present

## 2019-05-24 DIAGNOSIS — Z7982 Long term (current) use of aspirin: Secondary | ICD-10-CM | POA: Diagnosis not present

## 2019-05-24 DIAGNOSIS — E1140 Type 2 diabetes mellitus with diabetic neuropathy, unspecified: Secondary | ICD-10-CM | POA: Diagnosis present

## 2019-05-24 DIAGNOSIS — Z90710 Acquired absence of both cervix and uterus: Secondary | ICD-10-CM

## 2019-05-24 DIAGNOSIS — F3130 Bipolar disorder, current episode depressed, mild or moderate severity, unspecified: Secondary | ICD-10-CM | POA: Diagnosis not present

## 2019-05-24 DIAGNOSIS — R059 Cough, unspecified: Secondary | ICD-10-CM

## 2019-05-24 DIAGNOSIS — F17210 Nicotine dependence, cigarettes, uncomplicated: Secondary | ICD-10-CM | POA: Diagnosis present

## 2019-05-24 DIAGNOSIS — Z79899 Other long term (current) drug therapy: Secondary | ICD-10-CM | POA: Diagnosis not present

## 2019-05-24 DIAGNOSIS — Z7289 Other problems related to lifestyle: Secondary | ICD-10-CM

## 2019-05-24 DIAGNOSIS — E0810 Diabetes mellitus due to underlying condition with ketoacidosis without coma: Secondary | ICD-10-CM | POA: Diagnosis not present

## 2019-05-24 DIAGNOSIS — E1110 Type 2 diabetes mellitus with ketoacidosis without coma: Principal | ICD-10-CM | POA: Diagnosis present

## 2019-05-24 LAB — CBC
HCT: 41.4 % (ref 36.0–46.0)
HCT: 43.9 % (ref 36.0–46.0)
Hemoglobin: 14.7 g/dL (ref 12.0–15.0)
Hemoglobin: 15.2 g/dL — ABNORMAL HIGH (ref 12.0–15.0)
MCH: 30.1 pg (ref 26.0–34.0)
MCH: 30.2 pg (ref 26.0–34.0)
MCHC: 34.6 g/dL (ref 30.0–36.0)
MCHC: 35.5 g/dL (ref 30.0–36.0)
MCV: 84.7 fL (ref 80.0–100.0)
MCV: 87.3 fL (ref 80.0–100.0)
Platelets: 267 10*3/uL (ref 150–400)
Platelets: 303 10*3/uL (ref 150–400)
RBC: 4.89 MIL/uL (ref 3.87–5.11)
RBC: 5.03 MIL/uL (ref 3.87–5.11)
RDW: 12.5 % (ref 11.5–15.5)
RDW: 13 % (ref 11.5–15.5)
WBC: 10.2 10*3/uL (ref 4.0–10.5)
WBC: 8.9 10*3/uL (ref 4.0–10.5)
nRBC: 0 % (ref 0.0–0.2)
nRBC: 0 % (ref 0.0–0.2)

## 2019-05-24 LAB — BASIC METABOLIC PANEL
Anion gap: 12 (ref 5–15)
Anion gap: 20 — ABNORMAL HIGH (ref 5–15)
BUN: 30 mg/dL — ABNORMAL HIGH (ref 6–20)
BUN: 38 mg/dL — ABNORMAL HIGH (ref 6–20)
CO2: 12 mmol/L — ABNORMAL LOW (ref 22–32)
CO2: 20 mmol/L — ABNORMAL LOW (ref 22–32)
Calcium: 9.5 mg/dL (ref 8.9–10.3)
Calcium: 9.5 mg/dL (ref 8.9–10.3)
Chloride: 100 mmol/L (ref 98–111)
Chloride: 93 mmol/L — ABNORMAL LOW (ref 98–111)
Creatinine, Ser: 1.4 mg/dL — ABNORMAL HIGH (ref 0.44–1.00)
Creatinine, Ser: 2.33 mg/dL — ABNORMAL HIGH (ref 0.44–1.00)
GFR calc Af Amer: 26 mL/min — ABNORMAL LOW (ref >60)
GFR calc Af Amer: 48 mL/min — ABNORMAL LOW (ref >60)
GFR calc non Af Amer: 22 mL/min — ABNORMAL LOW (ref >60)
GFR calc non Af Amer: 42 mL/min — ABNORMAL LOW (ref >60)
Glucose, Bld: 184 mg/dL — ABNORMAL HIGH (ref 70–99)
Glucose, Bld: 707 mg/dL (ref 70–99)
Potassium: 3.3 mmol/L — ABNORMAL LOW (ref 3.5–5.1)
Potassium: 4.2 mmol/L (ref 3.5–5.1)
Sodium: 125 mmol/L — ABNORMAL LOW (ref 135–145)
Sodium: 132 mmol/L — ABNORMAL LOW (ref 135–145)

## 2019-05-24 LAB — URINALYSIS, ROUTINE W REFLEX MICROSCOPIC
Bacteria, UA: NONE SEEN
Bilirubin Urine: NEGATIVE
Glucose, UA: >=500 mg/dL — AB
Hgb urine dipstick: NEGATIVE
Ketones, ur: 20 mg/dL — AB
Leukocytes,Ua: NEGATIVE
Nitrite: NEGATIVE
Protein, ur: 30 mg/dL — AB
Specific Gravity, Urine: 1.023 (ref 1.005–1.030)
pH: 5 (ref 5.0–8.0)

## 2019-05-24 LAB — GLUCOSE, CAPILLARY
Glucose-Capillary: 201 mg/dL — ABNORMAL HIGH (ref 70–99)
Glucose-Capillary: 241 mg/dL — ABNORMAL HIGH (ref 70–99)
Glucose-Capillary: 277 mg/dL — ABNORMAL HIGH (ref 70–99)
Glucose-Capillary: 288 mg/dL — ABNORMAL HIGH (ref 70–99)
Glucose-Capillary: 302 mg/dL — ABNORMAL HIGH (ref 70–99)
Glucose-Capillary: 484 mg/dL — ABNORMAL HIGH (ref 70–99)
Glucose-Capillary: 576 mg/dL (ref 70–99)

## 2019-05-24 LAB — TROPONIN I (HIGH SENSITIVITY)
Troponin I (High Sensitivity): <2 ng/L (ref <18)
Troponin I (High Sensitivity): 2 ng/L (ref <18)

## 2019-05-24 LAB — PREGNANCY, URINE: Preg Test, Ur: NEGATIVE

## 2019-05-24 MED ORDER — SODIUM CHLORIDE 0.9 % IV SOLN
INTRAVENOUS | Status: DC
  Administered 2019-05-24 – 2019-05-25 (×2): via INTRAVENOUS

## 2019-05-24 MED ORDER — INSULIN REGULAR(HUMAN) IN NACL 100-0.9 UT/100ML-% IV SOLN
INTRAVENOUS | Status: DC
  Administered 2019-05-24: 5.4 [IU]/h via INTRAVENOUS
  Filled 2019-05-24: qty 100

## 2019-05-24 MED ORDER — DEXTROSE-NACL 5-0.45 % IV SOLN
INTRAVENOUS | Status: DC
  Administered 2019-05-24: 20:00:00 via INTRAVENOUS

## 2019-05-24 MED ORDER — INSULIN REGULAR(HUMAN) IN NACL 100-0.9 UT/100ML-% IV SOLN: INTRAVENOUS | Status: DC

## 2019-05-24 MED ORDER — DEXTROSE-NACL 5-0.45 % IV SOLN
INTRAVENOUS | Status: DC
  Administered 2019-05-25: 01:00:00 via INTRAVENOUS

## 2019-05-24 MED ORDER — SODIUM CHLORIDE 0.9% FLUSH: 3.0000 mL | Freq: Once | INTRAVENOUS | Status: DC

## 2019-05-24 MED ORDER — SODIUM CHLORIDE 0.9 % IV SOLN
INTRAVENOUS | Status: DC
  Administered 2019-05-25 – 2019-05-26 (×2): via INTRAVENOUS

## 2019-05-24 MED ORDER — HEPARIN SODIUM (PORCINE) 5000 UNIT/ML IJ SOLN
5000.0000 [IU] | Freq: Three times a day (TID) | INTRAMUSCULAR | Status: DC
  Administered 2019-05-25 – 2019-05-26 (×2): 5000 [IU] via SUBCUTANEOUS
  Filled 2019-05-24 (×5): qty 1

## 2019-05-24 MED ORDER — POTASSIUM CHLORIDE 10 MEQ/100ML IV SOLN
10.0000 meq | INTRAVENOUS | Status: AC
  Filled 2019-05-24 (×2): qty 100

## 2019-05-24 MED ORDER — LACTATED RINGERS IV BOLUS
1000.0000 mL | Freq: Once | INTRAVENOUS | Status: AC
  Administered 2019-05-24: 1000 mL via INTRAVENOUS

## 2019-05-24 NOTE — ED Notes (Signed)
Pt had ripped out IV running insulin- will recheck blood sugar

## 2019-05-24 NOTE — ED Provider Notes (Signed)
[facility] Emergency Department Provider Note   ____________________________________________   First MD Initiated Contact with Patient 05/24/19 1306     (approximate)  I have reviewed the triage vital signs and the nursing notes.   HISTORY  Chief Complaint Fall, Weakness, Hyperglycemia, and Hypertension    HPI Pamela Lane is a 58 y.o. female with past medical history of diabetes and recurrent DKA presents to the ED complaining of generalized weakness and falls.  Patient reports that she has been feeling weak and malaised for the past few days, "ever since I got the flu shot".  She states she has lost her balance and stumbled on multiple occasions today but denies ever fully falling and hitting her head.  She denies any fevers but does states she has been dealing with a cough and some difficulty breathing, denies chest pain.  She is not aware of any sick contacts and denies any vomiting, diarrhea, or abdominal pain.  She does state that her glucose has been running high, but she reports she has been compliant with both pills and insulin that she is prescribed.        Past Medical History:  Diagnosis Date  . Asthma   . Clostridium difficile colitis   . Diabetes mellitus without complication ([place])   . Hypertension   . Mental disorder    pt reports 'I have all of them mental disorders'    Patient Active Problem List   Diagnosis Date Noted  . Bipolar disorder ([place]) 01/17/2019  . Acute pancreatitis 05/15/2016  . AKI (acute kidney injury) ([place])   . Diabetic ketoacidosis without coma associated with type 2 diabetes mellitus ([place])   . Hyperglycemia due to type 2 diabetes mellitus ([place]) 01/13/2016  . UTI (urinary tract infection) 01/13/2016  . DKA (diabetic ketoacidoses) ([place]) 01/08/2016  . Hyponatremia 01/08/2016  . Dehydration 01/08/2016  . Urinary tract infection 01/08/2016  . Upper abdominal pain 01/08/2016  . Nausea & vomiting 01/08/2016     Past Surgical History:  Procedure Laterality Date  . ABDOMINAL HYSTERECTOMY      Prior to Admission medications   Medication Sig Start Date End Date Taking? Authorizing Provider  aspirin EC 81 MG tablet Take 81 mg by mouth daily.   Yes [provider]  buPROPion (WELLBUTRIN SR) 150 MG 12 hr tablet Take 150 mg by mouth 2 (two) times daily.   Yes [provider]  cetirizine (ZYRTEC) 10 MG tablet Take 10 mg by mouth daily. 05/11/19  Yes [provider]  diltiazem (CARDIZEM CD) 120 MG 24 hr capsule Take 1 capsule (120 mg total) by mouth 2 (two) times a day. 01/06/19  Yes Gouru, Illene Silver, MD  folic acid (FOLVITE) 1 MG tablet Take 1 tablet (1 mg total) by mouth daily. 03/08/18  Yes Gouru, Illene Silver, MD  gabapentin (NEURONTIN) 300 MG capsule Take 300 mg by mouth at bedtime. 05/11/19  Yes [provider]  glipiZIDE (GLUCOTROL XL) 5 MG 24 hr tablet Take 1 tablet (5 mg total) by mouth daily with breakfast. 01/06/19 01/07/20 Yes Gouru, Aruna, MD  Insulin Glargine (LANTUS) 100 UNIT/ML Solostar Pen Inject 30 Units into the skin daily. 01/06/19  Yes Gouru, Aruna, MD  ipratropium-albuterol (DUONEB) 0.5-2.5 (3) MG/3ML SOLN Take 3 mLs by nebulization every 6 (six) hours as needed for wheezing, shortness of breath or cough. 02/23/18  Yes [provider]  lisinopril (PRINIVIL) 10 MG tablet Take 1 tablet (10 mg total) by mouth daily. 02/18/18  Yes  Dustin Flock, MD  lurasidone (LATUDA) 40 MG TABS tablet Take 1 tablet (40 mg total) by mouth every evening. 01/18/19  Yes Mody, Ulice Bold, MD  metFORMIN (GLUCOPHAGE-XR) 500 MG 24 hr tablet Take 500 mg by mouth 2 (two) times daily. 05/11/19  Yes [provider]  Multiple Vitamin (MULTIVITAMIN WITH MINERALS) TABS tablet Take 1 tablet by mouth daily. 03/08/18  Yes Gouru, Aruna, MD  NOVOLOG FLEXPEN 100 UNIT/ML FlexPen Inject 5 Units into the skin 3 (three) times daily with meals. 01/06/19  Yes [provider]  senna-docusate  (SENOKOT-S) 8.6-50 MG tablet Take 1 tablet by mouth at bedtime as needed for mild constipation. 03/07/18  Yes Gouru, Illene Silver, MD  thiamine 100 MG tablet Take 1 tablet (100 mg total) by mouth daily. 03/08/18  Yes Gouru, Illene Silver, MD  albuterol (PROVENTIL HFA;VENTOLIN HFA) 108 (90 Base) MCG/ACT inhaler Inhale 2 puffs into the lungs every 6 (six) hours as needed for wheezing or shortness of breath. 02/18/18   Dustin Flock, MD  blood glucose meter kit and supplies KIT Dispense based on patient and insurance preference. Use up to four times daily as directed. (FOR ICD-9 250.00, 250.01). 01/06/19   Nicholes Mango, MD  blood glucose meter kit and supplies Dispense based on patient and insurance preference. Use up to four times daily as directed. (FOR ICD-10 E10.9, E11.9). 01/18/19   Mayo, Pete Pelt, MD  Insulin Pen Needle 32G X 5 MM MISC 30 Units by Does not apply route daily. 01/06/19   Nicholes Mango, MD    Allergies Patient has no known allergies.  Family History  Problem Relation Age of Onset  . Hypertension Mother     Social History Social History   Tobacco Use  . Smoking status: Current Every Day Smoker    Packs/day: 0.10    Types: Cigarettes  . Smokeless tobacco: Never Used  Substance Use Topics  . Alcohol use: Yes    Alcohol/week: 40.0 standard drinks    Types: 40 Cans of beer per week    Comment: 3 big bottles of liquor  . Drug use: Yes    Types: Marijuana, Cocaine    Review of Systems  Constitutional: No fever/chills.  Positive for generalized weakness and malaise. Eyes: No visual changes. ENT: No sore throat. Cardiovascular: Denies chest pain. Respiratory: Positive for cough and shortness of breath. Gastrointestinal: No abdominal pain.  No nausea, no vomiting.  No diarrhea.  No constipation. Genitourinary: Negative for dysuria. Musculoskeletal: Negative for back pain. Skin: Negative for rash. Neurological: Negative for headaches, focal weakness or numbness.   ____________________________________________   PHYSICAL EXAM:  VITAL SIGNS: ED Triage Vitals  Enc Vitals Group     BP 05/24/19 1111 (!) 127/104     Pulse Rate 05/24/19 1111 100     Resp 05/24/19 1111 16     Temp 05/24/19 1111 98.2 F (36.8 C)     Temp Source 05/24/19 1111 Oral     SpO2 05/24/19 1111 98 %     Weight --      Height 05/24/19 1109 5' 3" (1.6 m)     Head Circumference --      Peak Flow --      Pain Score 05/24/19 1107 8     Pain Loc --      Pain Edu? --      Excl. in [place]? --     Constitutional: Alert and oriented. Eyes: Conjunctivae are normal. Head: Atraumatic. Nose: No congestion/rhinnorhea. Mouth/Throat: Mucous membranes are moist. Neck:  Normal ROM Cardiovascular: Tachycardic, regular rhythm. Grossly normal heart sounds. Respiratory: Normal respiratory effort.  No retractions. Lungs CTAB. Gastrointestinal: Soft and nontender. No distention. Genitourinary: deferred Musculoskeletal: No lower extremity tenderness nor edema. Neurologic:  Normal speech and language. No gross focal neurologic deficits are appreciated. Skin:  Skin is warm, dry and intact. No rash noted. Psychiatric: Mood and affect are normal. Speech and behavior are normal.  ____________________________________________   LABS (all labs ordered are listed, but only abnormal results are displayed)  Labs Reviewed  BASIC METABOLIC PANEL - Abnormal; Notable for the following components:      Result Value   Sodium 125 (*)    Chloride 93 (*)    CO2 12 (*)    Glucose, Bld 707 (*)    BUN 38 (*)    Creatinine, Ser 2.33 (*)    GFR calc non Af Amer 22 (*)    GFR calc Af Amer 26 (*)    Anion gap 20 (*)    All other components within normal limits  CBC - Abnormal; Notable for the following components:   Hemoglobin 15.2 (*)    All other components within normal limits  BLOOD GAS, VENOUS - Abnormal; Notable for the following components:   pCO2, Ven 43 (*)    Bicarbonate 19.7 (*)    Acid-base  deficit 7.0 (*)    All other components within normal limits  URINALYSIS, ROUTINE W REFLEX MICROSCOPIC - Abnormal; Notable for the following components:   Color, Urine YELLOW (*)    APPearance HAZY (*)    Glucose, UA >=500 (*)    Ketones, ur 20 (*)    Protein, ur 30 (*)    All other components within normal limits  SARS CORONAVIRUS 2 (TAT 6-24 HRS)  PREGNANCY, URINE  TROPONIN I (HIGH SENSITIVITY)  TROPONIN I (HIGH SENSITIVITY)   ____________________________________________  EKG  ED ECG REPORT I, Blake Divine, the attending physician, personally viewed and interpreted this ECG.   Date: 05/24/2019  EKG Time: 11:24  Rate: 110  Rhythm: sinus tachycardia  Axis: Normal  Intervals:Borderline prolonged QT  ST&T Change: None   PROCEDURES  Procedure(s) performed (including Critical Care):  .Critical Care Performed by: Blake Divine, MD Authorized by: Blake Divine, MD   Critical care provider statement:    Critical care time (minutes):  45   Critical care time was exclusive of:  Separately billable procedures and treating other patients and teaching time   Critical care was necessary to treat or prevent imminent or life-threatening deterioration of the following conditions:  Metabolic crisis   Critical care was time spent personally by me on the following activities:  Discussions with consultants, evaluation of patient's response to treatment, examination of patient, ordering and performing treatments and interventions, ordering and review of laboratory studies, ordering and review of radiographic studies, pulse oximetry, re-evaluation of patient's condition, obtaining history from patient or surrogate and review of old charts   I assumed direction of critical care for this patient from another provider in my specialty: no       ____________________________________________   INITIAL IMPRESSION / ASSESSMENT AND PLAN / ED COURSE       58 year old female with history  of diabetes and recurrent DKA presents to the ED complaining of increasing malaise and weakness as well as a cough and shortness of breath.  Labs are consistent with DKA given hyperglycemia with increased anion gap and acidosis.  Will hydrate with bolus of IV fluids and start patient on insulin  drip with supplemental potassium.  Her chest x-ray is negative for acute process although I would be suspicious for COVID-19 in this patient given her symptoms.  There is also likely an element of medication noncompliance as patient has had issues with this on multiple occasions in the past.  UA is pending but no urinary symptoms to suggest UTI.  Case discussed with hospitalist, who accepts patient for admission.      ____________________________________________   FINAL CLINICAL IMPRESSION(S) / ED DIAGNOSES  Final diagnoses:  Diabetic ketoacidosis without coma associated with type 2 diabetes mellitus ([place])  Cough  Multiple falls     ED Discharge Orders    None       Note:  This document was prepared using Dragon voice recognition software and may include unintentional dictation errors.   Blake Divine, MD 05/24/19 [phone]

## 2019-05-24 NOTE — ED Notes (Signed)
Looked for a vein for IV access unsuccessful- English as a second language teacher at bedside to attempt

## 2019-05-24 NOTE — ED Notes (Signed)
Pt up to use bathroom at this time. 

## 2019-05-24 NOTE — ED Notes (Addendum)
Bill RN at bedside to attempt IV via ultrasound

## 2019-05-24 NOTE — ED Notes (Signed)
IV team at bedside 

## 2019-05-24 NOTE — ED Triage Notes (Signed)
Pt in via EMS from home with c/o flu-like sx's. EMS repors pt got her flu shot last week. FSBS 336, pt also HTN at 153/104, per EMS pt did not take any meds this am.

## 2019-05-24 NOTE — H&P (Addendum)
[facility] History and Physical  Pamela Lane RXY:[id number] DOB: 1960/10/28 DOA: 05/24/2019  Referring physician: ED  PCP: [facility] [place]   Chief Complaint: Weakness and hyperglycemia  HPI: Pamela Lane is a 58 y.o. female with past medical history of diabetes and recurrent DKA presented to the hospital with generalized weakness, fatigue and few falls recently.  Patient stated that she has been feeling weak and malaise for last few days ever since she received her flu shot.  Recently, she had lost her balance and stumbled and had few falls.  Patient also complains of mild cough and occasional chest pain on coughing for the last 2 weeks.  She however denies any fever.  She stated that that she had Covid test done in the past which was negative.    Patient denies any nausea, vomiting or abdominal pain at this time and states that he has normal appetite.  She has had a bowel movement. She denies any urinary urgency, frequency or dysuria.  Denies shortness of breath or dyspnea.  Patient is states that she is not very sure about her medications and has not taken her insulin at least for the last 1 day.  She states that is too much dealing with multiple medications for her.  ED Course: In the ED, patient was noted to have a anion gap metabolic acidosis with hyperglycemia suggestive of DKA.  Patient did have normal mentation.  Patient was then considered for admission to the hospital after initiating insulin drip.  Review of Systems:  All systems were reviewed and were negative unless otherwise mentioned in the HPI  Past Medical History:  Diagnosis Date  . Asthma   . Clostridium difficile colitis   . Diabetes mellitus without complication ([place])   . Hypertension   . Mental disorder    pt reports 'I have all of them mental disorders'   Past Surgical History:  Procedure Laterality Date  . ABDOMINAL HYSTERECTOMY      Social History:  reports that she  has been smoking cigarettes. She has been smoking about 0.10 packs per day. She has never used smokeless tobacco. She reports current alcohol use of about 40.0 standard drinks of alcohol per week. She reports current drug use. Drugs: Marijuana and Cocaine.  No Known Allergies  Family History  Problem Relation Age of Onset  . Hypertension Mother      Prior to Admission medications   Medication Sig Start Date End Date Taking? Authorizing Provider  aspirin EC 81 MG tablet Take 81 mg by mouth daily.   Yes [provider]  buPROPion (WELLBUTRIN SR) 150 MG 12 hr tablet Take 150 mg by mouth 2 (two) times daily.   Yes [provider]  cetirizine (ZYRTEC) 10 MG tablet Take 10 mg by mouth daily. 05/11/19  Yes [provider]  diltiazem (CARDIZEM CD) 120 MG 24 hr capsule Take 1 capsule (120 mg total) by mouth 2 (two) times a day. 01/06/19  Yes Gouru, Illene Silver, MD  folic acid (FOLVITE) 1 MG tablet Take 1 tablet (1 mg total) by mouth daily. 03/08/18  Yes Gouru, Illene Silver, MD  gabapentin (NEURONTIN) 300 MG capsule Take 300 mg by mouth at bedtime. 05/11/19  Yes [provider]  glipiZIDE (GLUCOTROL XL) 5 MG 24 hr tablet Take 1 tablet (5 mg total) by mouth daily with breakfast. 01/06/19 01/07/20 Yes Gouru, Aruna, MD  Insulin Glargine (LANTUS) 100 UNIT/ML Solostar Pen Inject 30 Units into the skin daily. 01/06/19  Yes Gouru, Aruna, MD  ipratropium-albuterol (DUONEB) 0.5-2.5 (3) MG/3ML SOLN Take 3 mLs by nebulization every 6 (six) hours as needed for wheezing, shortness of breath or cough. 02/23/18  Yes [provider]  lisinopril (PRINIVIL) 10 MG tablet Take 1 tablet (10 mg total) by mouth daily. 02/18/18  Yes Dustin Flock, MD  lurasidone (LATUDA) 40 MG TABS tablet Take 1 tablet (40 mg total) by mouth every evening. 01/18/19  Yes Mody, Ulice Bold, MD  metFORMIN (GLUCOPHAGE-XR) 500 MG 24 hr tablet Take 500 mg by mouth 2 (two) times daily. 05/11/19  Yes [provider]   Multiple Vitamin (MULTIVITAMIN WITH MINERALS) TABS tablet Take 1 tablet by mouth daily. 03/08/18  Yes Gouru, Aruna, MD  NOVOLOG FLEXPEN 100 UNIT/ML FlexPen Inject 5 Units into the skin 3 (three) times daily with meals. 01/06/19  Yes [provider]  senna-docusate (SENOKOT-S) 8.6-50 MG tablet Take 1 tablet by mouth at bedtime as needed for mild constipation. 03/07/18  Yes Gouru, Illene Silver, MD  thiamine 100 MG tablet Take 1 tablet (100 mg total) by mouth daily. 03/08/18  Yes Gouru, Illene Silver, MD  albuterol (PROVENTIL HFA;VENTOLIN HFA) 108 (90 Base) MCG/ACT inhaler Inhale 2 puffs into the lungs every 6 (six) hours as needed for wheezing or shortness of breath. 02/18/18   Dustin Flock, MD  blood glucose meter kit and supplies KIT Dispense based on patient and insurance preference. Use up to four times daily as directed. (FOR ICD-9 250.00, 250.01). 01/06/19   Nicholes Mango, MD  blood glucose meter kit and supplies Dispense based on patient and insurance preference. Use up to four times daily as directed. (FOR ICD-10 E10.9, E11.9). 01/18/19   Mayo, Pete Pelt, MD  Insulin Pen Needle 32G X 5 MM MISC 30 Units by Does not apply route daily. 01/06/19   Nicholes Mango, MD    Physical Exam: Vitals:   05/24/19 1109 05/24/19 1111  BP:  (!) 127/104  Pulse:  100  Resp:  16  Temp:  98.2 F (36.8 C)  TempSrc:  Oral  SpO2:  98%  Height: '5\' 3"'  (1.6 m)    Wt Readings from Last 3 Encounters:  04/01/19 87.3 kg  01/17/19 86.5 kg  01/02/19 92.6 kg   Body mass index is 34.09 kg/m.  General: Obese, not in obvious distress HENT: Normocephalic, pupils equally reacting to light and accommodation.  No scleral pallor or icterus noted. Oral mucosa is dry Chest:  Clear breath sounds.  Diminished breath sounds bilaterally. No crackles or wheezes.  CVS: S1 &S2 heard. No murmur.  Regular rate and rhythm. Abdomen: Soft, nontender, nondistended.  Bowel sounds are heard.  Liver is not palpable, no abdominal mass palpated  Extremities: No cyanosis, clubbing or edema.  Peripheral pulses are palpable. Psych: Alert, awake and oriented, slightly irritated CNS:  No cranial nerve deficits.  Power equal in all extremities.   No cerebellar signs.   Skin: Warm and dry.  No rashes noted.  Labs on Admission:   CBC: Recent Labs  Lab 05/24/19 1115  WBC 10.2  HGB 15.2*  HCT 43.9  MCV 87.3  PLT 619    Basic Metabolic Panel: Recent Labs  Lab 05/24/19 1115  NA 125*  K 4.2  CL 93*  CO2 12*  GLUCOSE 707*  BUN 38*  CREATININE 2.33*  CALCIUM 9.5    Liver Function Tests: No results for input(s): AST, ALT, ALKPHOS, BILITOT, PROT, ALBUMIN in the last 168 hours. No results for input(s): LIPASE, AMYLASE in the last  168 hours. No results for input(s): AMMONIA in the last 168 hours.  Cardiac Enzymes: No results for input(s): CKTOTAL, CKMB, CKMBINDEX, TROPONINI in the last 168 hours.  BNP (last 3 results) No results for input(s): BNP in the last 8760 hours.  ProBNP (last 3 results) No results for input(s): PROBNP in the last 8760 hours.  CBG: No results for input(s): GLUCAP in the last 168 hours.  Lipase     Component Value Date/Time   LIPASE 35 01/03/2019 0455     Urinalysis    Component Value Date/Time   COLORURINE YELLOW (A) 05/24/2019 1422   APPEARANCEUR HAZY (A) 05/24/2019 1422   LABSPEC 1.023 05/24/2019 1422   PHURINE 5.0 05/24/2019 1422   GLUCOSEU >=500 (A) 05/24/2019 1422   HGBUR NEGATIVE 05/24/2019 1422   BILIRUBINUR NEGATIVE 05/24/2019 1422   KETONESUR 20 (A) 05/24/2019 1422   PROTEINUR 30 (A) 05/24/2019 1422   NITRITE NEGATIVE 05/24/2019 1422   LEUKOCYTESUR NEGATIVE 05/24/2019 1422     Drugs of Abuse     Component Value Date/Time   LABOPIA POSITIVE (A) 12/26/2018 0302   LABOPIA NONE DETECTED 04/02/2011 1236   COCAINSCRNUR NONE DETECTED 12/26/2018 0302   LABBENZ NONE DETECTED 12/26/2018 0302   LABBENZ NONE DETECTED 04/02/2011 1236   AMPHETMU NONE DETECTED 12/26/2018 0302    AMPHETMU NONE DETECTED 04/02/2011 1236   THCU NONE DETECTED 12/26/2018 0302   THCU POSITIVE (A) 04/02/2011 1236   LABBARB NONE DETECTED 12/26/2018 0302   LABBARB NONE DETECTED 04/02/2011 1236      Radiological Exams on Admission: Dg Chest 2 View  Result Date: 05/24/2019 CLINICAL DATA:  Chest pain.  Chest congestion. EXAM: CHEST - 2 VIEW COMPARISON:  04/01/2019 FINDINGS: Midline trachea.  Normal heart size and mediastinal contours. Sharp costophrenic angles.  No pneumothorax.  Clear lungs. IMPRESSION: No active cardiopulmonary disease. Electronically Signed   By: Abigail Miyamoto M.D.   On: 05/24/2019 12:05    EKG: Personally reviewed by me which shows sinus tachycardia  Assessment/Plan  Principal Problem:   Diabetic ketoacidosis without coma associated with type 2 diabetes mellitus ([place]) Active Problems:   Hyponatremia   Dehydration   AKI (acute kidney injury) ([place])   Bipolar disorder ([place])  Diabetic ketoacidosis.  Anion gap of 20.  Will admit the patient in stepdown unit.  Follow DKA protocol with insulin drip IV fluids, and electrolyte replacement.  Accu-Cheks q. 1 hourly and BMP every 4 hourly.  Will consider long-acting insulin once anion gap closes.  No obvious precipitating factors noted.  Likely secondary to insulin noncompliance.  Patient is on glipizide, Metformin, Lantus and NovoLog at home.  Pseudohyponatremia from hyperglycemia.  We will continue insulin and IV fluids.  Check BMP every 4 hourly.  Acute kidney injury.  Continue IV fluid hydration.  AKI likely secondary to volume depletion, patient on ACE inhibitor's at home.  Hold lisinopril.  Creatinine last month at 0.7.  Volume depletion.  Continue with IV fluid hydration.  Essential hypertension.  Continue Cardizem.  Hold lisinopril for now.  History of bipolar disorder.  Continue Wellbutrin and Latuda  Chronic pain.  Continue gabapentin.   Consultant: None  Code Status: Code  DVT Prophylaxis: Heparin subcu   Antibiotics: None  Family Communication:  Patients' condition and plan of care including tests being ordered have been discussed with the patient  who indicate understanding and agree with the plan.  Disposition Plan: Home in 2 to 3 days  Severity of Illness: The appropriate patient status for this  patient is INPATIENT. Inpatient status is judged to be reasonable and necessary in order to provide the required intensity of service to ensure the patient's safety. The patient's presenting symptoms, physical exam findings, and initial radiographic and laboratory data in the context of their chronic comorbidities is felt to place them at high risk for further clinical deterioration. Furthermore, it is not anticipated that the patient will be medically stable for discharge from the hospital within 2 midnights of admission. I certify that at the point of admission it is my clinical judgment that the patient will require inpatient hospital care spanning beyond 2 midnights from the point of admission due to high intensity of service, high risk for further deterioration and high frequency of surveillance required.   Signed, Flora Lipps, MD [facility] 05/24/2019

## 2019-05-24 NOTE — ED Triage Notes (Signed)
Says she is here because since she got the flu shot on Thursday she has chest congestion, sugar is up and bp is up.  Says fell x 2 because she is stumbling.

## 2019-05-24 NOTE — ED Notes (Signed)
Pts CBG recheck: 201; Pt requesting food Deneise Lever, RN made aware of both.

## 2019-05-25 ENCOUNTER — Other Ambulatory Visit: Payer: Self-pay

## 2019-05-25 DIAGNOSIS — E871 Hypo-osmolality and hyponatremia: Secondary | ICD-10-CM

## 2019-05-25 DIAGNOSIS — R296 Repeated falls: Secondary | ICD-10-CM

## 2019-05-25 DIAGNOSIS — F3130 Bipolar disorder, current episode depressed, mild or moderate severity, unspecified: Secondary | ICD-10-CM

## 2019-05-25 DIAGNOSIS — N179 Acute kidney failure, unspecified: Secondary | ICD-10-CM

## 2019-05-25 DIAGNOSIS — E1110 Type 2 diabetes mellitus with ketoacidosis without coma: Principal | ICD-10-CM

## 2019-05-25 LAB — BASIC METABOLIC PANEL
Anion gap: 13 (ref 5–15)
BUN: 24 mg/dL — ABNORMAL HIGH (ref 6–20)
CO2: 17 mmol/L — ABNORMAL LOW (ref 22–32)
Calcium: 8.6 mg/dL — ABNORMAL LOW (ref 8.9–10.3)
Chloride: 102 mmol/L (ref 98–111)
Creatinine, Ser: 1.12 mg/dL — ABNORMAL HIGH (ref 0.44–1.00)
GFR calc Af Amer: >60 mL/min (ref >60)
GFR calc non Af Amer: 54 mL/min — ABNORMAL LOW (ref >60)
Glucose, Bld: 204 mg/dL — ABNORMAL HIGH (ref 70–99)
Potassium: 3.2 mmol/L — ABNORMAL LOW (ref 3.5–5.1)
Sodium: 132 mmol/L — ABNORMAL LOW (ref 135–145)

## 2019-05-25 LAB — TROPONIN I (HIGH SENSITIVITY)
Troponin I (High Sensitivity): 2 ng/L (ref <18)
Troponin I (High Sensitivity): 3 ng/L (ref <18)

## 2019-05-25 LAB — GLUCOSE, CAPILLARY
Glucose-Capillary: 131 mg/dL — ABNORMAL HIGH (ref 70–99)
Glucose-Capillary: 136 mg/dL — ABNORMAL HIGH (ref 70–99)
Glucose-Capillary: 139 mg/dL — ABNORMAL HIGH (ref 70–99)
Glucose-Capillary: 183 mg/dL — ABNORMAL HIGH (ref 70–99)
Glucose-Capillary: 238 mg/dL — ABNORMAL HIGH (ref 70–99)
Glucose-Capillary: 341 mg/dL — ABNORMAL HIGH (ref 70–99)
Glucose-Capillary: 383 mg/dL — ABNORMAL HIGH (ref 70–99)
Glucose-Capillary: 495 mg/dL — ABNORMAL HIGH (ref 70–99)

## 2019-05-25 LAB — SARS CORONAVIRUS 2 (TAT 6-24 HRS): SARS Coronavirus 2: NEGATIVE

## 2019-05-25 MED ORDER — INSULIN ASPART 100 UNIT/ML ~~LOC~~ SOLN
0.0000 [IU] | Freq: Three times a day (TID) | SUBCUTANEOUS | Status: DC
  Administered 2019-05-26: 7 [IU] via SUBCUTANEOUS
  Filled 2019-05-25: qty 1

## 2019-05-25 MED ORDER — PHENOL 1.4 % MT LIQD
1.0000 | OROMUCOSAL | Status: DC | PRN
  Administered 2019-05-25: 1 via OROMUCOSAL
  Filled 2019-05-25: qty 177

## 2019-05-25 MED ORDER — INSULIN ASPART 100 UNIT/ML ~~LOC~~ SOLN
0.0000 [IU] | SUBCUTANEOUS | Status: DC
  Administered 2019-05-25: 5 [IU] via SUBCUTANEOUS
  Administered 2019-05-25: 11 [IU] via SUBCUTANEOUS
  Administered 2019-05-25: 3 [IU] via SUBCUTANEOUS
  Filled 2019-05-25 (×3): qty 1

## 2019-05-25 MED ORDER — INSULIN GLARGINE 100 UNIT/ML ~~LOC~~ SOLN
25.0000 [IU] | Freq: Every day | SUBCUTANEOUS | Status: DC
  Administered 2019-05-25: 25 [IU] via SUBCUTANEOUS
  Filled 2019-05-25 (×2): qty 0.25

## 2019-05-25 MED ORDER — IPRATROPIUM-ALBUTEROL 0.5-2.5 (3) MG/3ML IN SOLN: 3.0000 mL | RESPIRATORY_TRACT | Status: DC | PRN

## 2019-05-25 MED ORDER — INSULIN DETEMIR 100 UNIT/ML ~~LOC~~ SOLN
20.0000 [IU] | Freq: Every day | SUBCUTANEOUS | Status: DC
  Administered 2019-05-25: 20 [IU] via SUBCUTANEOUS
  Filled 2019-05-25: qty 0.2

## 2019-05-25 MED ORDER — INSULIN ASPART 100 UNIT/ML ~~LOC~~ SOLN: 0.0000 [IU] | Freq: Three times a day (TID) | SUBCUTANEOUS | Status: DC

## 2019-05-25 MED ORDER — INSULIN ASPART 100 UNIT/ML ~~LOC~~ SOLN: 0.0000 [IU] | SUBCUTANEOUS | Status: DC

## 2019-05-25 MED ORDER — LURASIDONE HCL 40 MG PO TABS
40.0000 mg | ORAL_TABLET | Freq: Every evening | ORAL | Status: DC
  Administered 2019-05-25: 40 mg via ORAL
  Filled 2019-05-25 (×3): qty 1

## 2019-05-25 MED ORDER — BUPROPION HCL ER (SR) 150 MG PO TB12
150.0000 mg | ORAL_TABLET | Freq: Two times a day (BID) | ORAL | Status: DC
  Administered 2019-05-25 – 2019-05-26 (×3): 150 mg via ORAL
  Filled 2019-05-25 (×5): qty 1

## 2019-05-25 MED ORDER — INSULIN ASPART 100 UNIT/ML ~~LOC~~ SOLN
3.0000 [IU] | Freq: Once | SUBCUTANEOUS | Status: AC
  Administered 2019-05-25: 3 [IU] via SUBCUTANEOUS

## 2019-05-25 MED ORDER — INSULIN ASPART 100 UNIT/ML ~~LOC~~ SOLN
0.0000 [IU] | Freq: Every day | SUBCUTANEOUS | Status: DC
  Administered 2019-05-25: 5 [IU] via SUBCUTANEOUS
  Filled 2019-05-25: qty 1

## 2019-05-25 MED ORDER — POTASSIUM CHLORIDE CRYS ER 20 MEQ PO TBCR
40.0000 meq | EXTENDED_RELEASE_TABLET | Freq: Once | ORAL | Status: AC
  Administered 2019-05-25: 40 meq via ORAL
  Filled 2019-05-25: qty 2

## 2019-05-25 MED ORDER — INSULIN ASPART 100 UNIT/ML ~~LOC~~ SOLN
20.0000 [IU] | Freq: Once | SUBCUTANEOUS | Status: AC
  Administered 2019-05-25: 20 [IU] via SUBCUTANEOUS
  Filled 2019-05-25: qty 1

## 2019-05-25 MED ORDER — LORATADINE 10 MG PO TABS
10.0000 mg | ORAL_TABLET | Freq: Every day | ORAL | Status: DC
  Administered 2019-05-25 – 2019-05-26 (×2): 10 mg via ORAL
  Filled 2019-05-25 (×2): qty 1

## 2019-05-25 MED ORDER — PNEUMOCOCCAL VAC POLYVALENT 25 MCG/0.5ML IJ INJ
0.5000 mL | INJECTION | INTRAMUSCULAR | Status: AC
  Administered 2019-05-26: 0.5 mL via INTRAMUSCULAR
  Filled 2019-05-25: qty 0.5

## 2019-05-25 MED ORDER — GABAPENTIN 300 MG PO CAPS
300.0000 mg | ORAL_CAPSULE | Freq: Every day | ORAL | Status: DC
  Administered 2019-05-25: 300 mg via ORAL
  Filled 2019-05-25 (×2): qty 1

## 2019-05-25 MED ORDER — DILTIAZEM HCL ER COATED BEADS 120 MG PO CP24
120.0000 mg | ORAL_CAPSULE | Freq: Every day | ORAL | Status: DC
  Administered 2019-05-25 – 2019-05-26 (×2): 120 mg via ORAL
  Filled 2019-05-25 (×2): qty 1

## 2019-05-25 MED ORDER — FOLIC ACID 1 MG PO TABS
1.0000 mg | ORAL_TABLET | Freq: Every day | ORAL | Status: DC
  Administered 2019-05-25 – 2019-05-26 (×2): 1 mg via ORAL
  Filled 2019-05-25 (×2): qty 1

## 2019-05-25 MED ORDER — SENNOSIDES-DOCUSATE SODIUM 8.6-50 MG PO TABS
1.0000 | ORAL_TABLET | Freq: Every evening | ORAL | Status: DC | PRN
  Filled 2019-05-25: qty 1

## 2019-05-25 MED ORDER — MORPHINE SULFATE (PF) 2 MG/ML IV SOLN
2.0000 mg | Freq: Once | INTRAVENOUS | Status: AC
  Administered 2019-05-25: 2 mg via INTRAVENOUS
  Filled 2019-05-25: qty 1

## 2019-05-25 MED ORDER — INSULIN ASPART 100 UNIT/ML ~~LOC~~ SOLN
3.0000 [IU] | Freq: Three times a day (TID) | SUBCUTANEOUS | Status: DC
  Administered 2019-05-25 – 2019-05-26 (×3): 3 [IU] via SUBCUTANEOUS
  Filled 2019-05-25 (×3): qty 1

## 2019-05-25 MED ORDER — VITAMIN B-1 100 MG PO TABS
100.0000 mg | ORAL_TABLET | Freq: Every day | ORAL | Status: DC
  Administered 2019-05-25 – 2019-05-26 (×2): 100 mg via ORAL
  Filled 2019-05-25 (×2): qty 1

## 2019-05-25 MED ORDER — ALUM & MAG HYDROXIDE-SIMETH 200-200-20 MG/5ML PO SUSP
30.0000 mL | ORAL | Status: DC | PRN
  Administered 2019-05-25: 30 mL via ORAL
  Filled 2019-05-25: qty 30

## 2019-05-25 NOTE — ED Notes (Signed)
Pt given meal tray.

## 2019-05-25 NOTE — ED Notes (Signed)
Admit MD at bedside. States pt can eat and we no longer need BMPs

## 2019-05-25 NOTE — Progress Notes (Signed)
BRIEF OVERNIGHT REPORT   Patient transitionedfrom IVto SQ insulin, Lantus 25 units Q24H, CBGs Q4H, Novolog 0-15 units Q4H,  diet resumed will start Novolog 3 units TID with meals for meal coverage if patient eats at least 50% of meals.  Rufina Falco, DNP, CCRN, FNP-C [facility] Nurse Practitioner Between 7pm to 7am - Pager [phone] Actively using [employer] secure chat messaging  After 7am go to [email or web address] - [email or web address]  [facility] [employer]  [phone]

## 2019-05-25 NOTE — Evaluation (Addendum)
Physical Therapy Evaluation Patient Details Name: Pamela Lane MRN: [id number] DOB: 1961/01/21 Today's Date: 05/25/2019   History of Present Illness  Pt is 58 y.o. female with past medical history of diabetes, HTN, asthma,  and recurrent DKA presents to the ED complaining of generalized weakness and falls. In ED patient was noted to have a anion gap metabolic acidosis with hyperglycemia suggestive of DKA.    Clinical Impression  The patient was A&Ox4, in bed. Reported being independent at baseline prior to admission, 3 falls in one day prior to hospitalization, lives with her boyfriend.  Pt without complaint initially, but endorsed mid sternal pain once she came to sitting, and also complained of lightheadedness which she attributed to hunger. RN notified of both complaints as well as elevated HR with mobility. The patient demonstrated bed mobility and sit <> stand transfers impulsively during the session, mod I. Pt able to stand without support for several minutes to assess BP and HR. Further mobility deferred due to HR in 130s. Pt did report some improvement in mid sternal pain with rest and food, pt in NAD at end of session. The patient demonstrated mild deficits in mobility and would benefit from further skilled PT intervention to address these as well as further assessment. Current discharge recommendation is HHPT, pt stated she does not drive.    Follow Up Recommendations Home health PT    Equipment Recommendations  None recommended by PT    Recommendations for Other Services       Precautions / Restrictions Precautions Precautions: Fall Precaution Comments: watch HR Restrictions Weight Bearing Restrictions: No      Mobility  Bed Mobility Overal bed mobility: Modified Independent             General bed mobility comments: Pt unaware of how close to EOB she was, cued for safety  Transfers Overall transfer level: Modified independent               General  transfer comment: performed impulsively twice, no LOB or unsteadiness noted.  Ambulation/Gait             General Gait Details: deferred due to HR  Stairs            Wheelchair Mobility    Modified Rankin (Stroke Patients Only)       Balance Overall balance assessment: Needs assistance Sitting-balance support: Feet unsupported Sitting balance-Leahy Scale: Good       Standing balance-Leahy Scale: Good                               Pertinent Vitals/Pain Pain Assessment: Faces Faces Pain Scale: Hurts little more Pain Location: mid sternal area Pain Descriptors / Indicators: Burning Pain Intervention(s): Limited activity within patient's tolerance;Monitored during session;Repositioned;Other (comment)(RN notified)    Home Living Family/patient expects to be discharged to:: Private residence Living Arrangements: Spouse/significant other Available Help at Discharge: Family;Available PRN/intermittently Type of Home: House Home Access: Stairs to enter Entrance Stairs-Rails: None Entrance Stairs-Number of Steps: 4 Home Layout: One level Home Equipment: Walker - 2 wheels Additional Comments: Pt endorsed 3 falls in the day leading up to hospitalization. Denies any other falls in the last 6 months    Prior Function Level of Independence: Independent               Hand Dominance   Dominant Hand: Right    Extremity/Trunk Assessment   Upper Extremity Assessment Upper Extremity  Assessment: Overall WFL for tasks assessed    Lower Extremity Assessment Lower Extremity Assessment: Overall WFL for tasks assessed    Cervical / Trunk Assessment Cervical / Trunk Assessment: Normal  Communication   Communication: No difficulties  Cognition Arousal/Alertness: Awake/alert Behavior During Therapy: WFL for tasks assessed/performed;Impulsive Overall Cognitive Status: Within Functional Limits for tasks assessed                                         General Comments      Exercises     Assessment/Plan    PT Assessment Patient needs continued PT services  PT Problem List Decreased mobility;Decreased balance;Decreased activity tolerance       PT Treatment Interventions Therapeutic exercise;Gait training;Balance training;Stair training;Neuromuscular re-education;Functional mobility training;Therapeutic activities;Patient/family education    PT Goals (Current goals can be found in the Care Plan section)  Acute Rehab PT Goals Patient Stated Goal: to go home PT Goal Formulation: With patient Time For Goal Achievement: 06/08/19 Potential to Achieve Goals: Good    Frequency Min 2X/week   Barriers to discharge        Co-evaluation               AM-PAC PT "6 Clicks" Mobility  Outcome Measure Help needed turning from your back to your side while in a flat bed without using bedrails?: None Help needed moving from lying on your back to sitting on the side of a flat bed without using bedrails?: None Help needed moving to and from a bed to a chair (including a wheelchair)?: None Help needed standing up from a chair using your arms (e.g., wheelchair or bedside chair)?: None Help needed to walk in hospital room?: A Little Help needed climbing 3-5 steps with a railing? : A Little 6 Click Score: 22    End of Session Equipment Utilized During Treatment: Gait belt Activity Tolerance: Treatment limited secondary to medical complications (Comment);Other (comment)(limited by elevated HR) Patient left: in bed;with call bell/phone within reach Nurse Communication: Mobility status PT Visit Diagnosis: Other abnormalities of gait and mobility (R26.89)    Time: 1310-1326 PT Time Calculation (min) (ACUTE ONLY): 16 min   Charges:   PT Evaluation $PT Eval Moderate Complexity: 1 Mod         Lieutenant Diego PT, DPT 3:17 PM,05/25/19 [phone]

## 2019-05-25 NOTE — Progress Notes (Signed)
Inpatient Diabetes Program Recommendations  AACE/ADA: New Consensus Statement on Inpatient Glycemic Control (2015)  Target Ranges:  Prepandial:   less than 140 mg/dL      Peak postprandial:   less than 180 mg/dL (1-2 hours)      Critically ill patients:  140 - 180 mg/dL   Results for BERDA, SHELVIN (MRN [id number]) as of 05/25/2019 07:20  Ref. Range 05/24/2019 11:15  Sodium Latest Ref Range: 135 - 145 mmol/L 125 (L)  Potassium Latest Ref Range: 3.5 - 5.1 mmol/L 4.2  Chloride Latest Ref Range: 98 - 111 mmol/L 93 (L)  CO2 Latest Ref Range: 22 - 32 mmol/L 12 (L)  Glucose Latest Ref Range: 70 - 99 mg/dL 707 (HH)  BUN Latest Ref Range: 6 - 20 mg/dL 38 (H)  Creatinine Latest Ref Range: 0.44 - 1.00 mg/dL 2.33 (H)  Calcium Latest Ref Range: 8.9 - 10.3 mg/dL 9.5  Anion gap Latest Ref Range: 5 - 15  20 (H)   Results for KAMARII, BUREN (MRN [id number]) as of 05/25/2019 07:20  Ref. Range 12/26/2018 12:30 04/01/2019 10:34  Hemoglobin A1C Latest Ref Range: 4.8 - 5.6 % 14.2 (H) 13.2 (H)    Admit with: DKA  History: DM  Home DM Meds: Lantus 30 units Daily       Glipizide 5 mg Daily       Metformin 500 mg BID  Current Orders: Lantus 25 units QHS      Novolog Moderate Correction Scale/ SSI (0-15 units) Q4 hours      Novolog 3 units TID with meals   PCP: [facility] [place]      Patient well known to the Inpatient Diabetes Team--Has been counseled by the Diabetes Coordinator on previous visits.    Patient noted to have non-compliance with insulin regimen.  Has stated on previous admissions that she has run out of insulin.  Transitioned off the IV Insulin drip this AM--Levemir 20 units given at 2am today and Novolog SSI started at 4am today.     --Will follow patient during hospitalization--  Wyn Quaker RN, MSN, CDE Diabetes Coordinator Inpatient Glycemic Control Team Team Pager: [phone] (8a-5p)

## 2019-05-25 NOTE — ED Notes (Signed)
Attempted to call floor for pt, Advised they are changing pt's level of care to Medsurg. RN Marya Amsler Charge aware.

## 2019-05-25 NOTE — ED Notes (Signed)
Assisted pt to bathroom

## 2019-05-25 NOTE — ED Notes (Signed)
Pt acutely complaining of chest pain.  ekg done. Admitting md gherge notified and at bedside.

## 2019-05-25 NOTE — ED Notes (Addendum)
BG 324.  

## 2019-05-25 NOTE — Progress Notes (Signed)
PROGRESS NOTE  CADE OLBERDING NUU:[id number] DOB: 10-Jun-1961 DOA: 05/24/2019 [facility]CP: [facility]   LOS: 1 day   Brief Narrative / Interim history: 58 year old female with history of diabetes, recurrent DKA's came to the hospital and was admitted on 05/24/2019 with generalized weakness, fatigue, few falls.  She has been having a mild cough for the past 2 weeks.  She denies any fever or chills.  She denies any sick contacts.  She has no nausea, vomiting or abdominal pain.  In the ED, she was found to have anion gap metabolic acidosis with DKA, she was placed on insulin drip and was admitted to the hospital.  Subjective / 24h Interval events: She is feeling improved this morning, on my evaluation she feels stronger, better and hungry  Assessment & Plan: Principal Problem DKA in the setting of poorly controlled type 2 diabetes mellitus with hyperglycemia -Anion gap was 20 on admission, she was admitted to stepdown requiring insulin infusion along with fluids.  Her gap is closed and she was transitioned to subcutaneous insulin.  This morning her CBG is in the low 200s, her BMP is stable with close anion gap, she will be allowed a diet.  We will continue to closely monitor, she is at high risk for developing hypoglycemia. -Long discussion with patient at bedside this morning, discussed in detail her diet as she occasionally has sugary drinks and it appears that she eats highly refined carbohydrates.  We discussed about low-carb diet as well as unrefined carbohydrates and a diet rich in fruits and vegetables.  Also discussed about daily CBG monitoring and insulin administration, ensuring that she does not run out of insulin and asks for refill before that happens  Active Problems Weakness, recurrent falls -We will consult PT  Acute kidney injury -Creatinine 2.3 on admission from prior normal levels, improved to 1.1 after IV fluids, higher than her baseline, continue fluids  for the next 24 hours  Hypertension -Continue home Cardizem, hold lisinopril given acute kidney injury  History of bipolar disorder -Continue home medications  Chronic neuropathic pain -Continue gabapentin  Scheduled Meds: . buPROPion  150 mg Oral BID  . diltiazem  120 mg Oral Daily  . folic acid  1 mg Oral Daily  . gabapentin  300 mg Oral QHS  . heparin  5,000 Units Subcutaneous Q8H  . insulin aspart  0-15 Units Subcutaneous Q4H  . insulin aspart  3 Units Subcutaneous TID WC  . insulin glargine  25 Units Subcutaneous QHS  . loratadine  10 mg Oral Daily  . lurasidone  40 mg Oral QPM  . sodium chloride flush  3 mL Intravenous Once  . thiamine  100 mg Oral Daily   Continuous Infusions: . sodium chloride Stopped (05/24/19 2027)  . sodium chloride 100 mL/hr at 05/25/19 0428  . dextrose 5 % and 0.45% NaCl Stopped (05/25/19 0428)  . dextrose 5 % and 0.45% NaCl Stopped (05/25/19 0429)  . insulin Stopped (05/25/19 0428)   PRN Meds:.ipratropium-albuterol, senna-docusate  DVT prophylaxis: Heparin subcu Code Status: Full code Family Communication: Discussed with patient Disposition Plan: Home when ready  Consultants:  None  Procedures:  None   Microbiology   Lab Results  Component Value Date   SARSCOV2NAA NEGATIVE 05/24/2019   SARSCOV2NAA NEGATIVE 04/01/2019   SARSCOV2NAA NEGATIVE 01/17/2019   SARSCOV2NAA NEGATIVE 01/02/2019     Antimicrobials: None     Objective: Vitals:   05/25/19 0700 05/25/19 0738 05/25/19 1028 05/25/19 1031  BP: Marland Kitchen)  154/98  (!) 142/79 133/68  Pulse: 91  100   Resp: 18  18   Temp:   98.6 F (37 C)   TempSrc:   Oral   SpO2: 97%  100%   Weight:  87.3 kg    Height:  5\' 3"  (1.6 m)      Intake/Output Summary (Last 24 hours) at 05/25/2019 1038 Last data filed at 05/25/2019 0429 Gross per 24 hour  Intake 3100 ml  Output -  Net 3100 ml   Filed Weights   05/25/19 0738  Weight: 87.3 kg    Examination:  Constitutional: NAD Eyes:  lids and conjunctivae normal, no scleral icterus ENMT: Mucous membranes are moist.  Neck: normal, supple Respiratory: clear to auscultation bilaterally, no wheezing, no crackles. Normal respiratory effort. No accessory muscle use.  Cardiovascular: Regular rate and rhythm, no murmurs / rubs / gallops. No LE edema.  Abdomen: no tenderness. Bowel sounds positive.  Musculoskeletal: no clubbing / cyanosis.  Skin: no rashes Neurologic: CN 2-12 grossly intact. Strength 5/5 in all 4.  Psychiatric: Normal judgment and insight. Alert and oriented x 3. Normal mood.   Data Reviewed: I have independently reviewed following labs and imaging studies   CBC: Recent Labs  Lab 05/24/19 1115 05/24/19 2336  WBC 10.2 8.9  HGB 15.2* 14.7  HCT 43.9 41.4  MCV 87.3 84.7  PLT 303 035   Basic Metabolic Panel: Recent Labs  Lab 05/24/19 1115 05/24/19 2336 05/25/19 0602  NA 125* 132* 132*  K 4.2 3.3* 3.2*  CL 93* 100 102  CO2 12* 20* 17*  GLUCOSE 707* 184* 204*  BUN 38* 30* 24*  CREATININE 2.33* 1.40* 1.12*  CALCIUM 9.5 9.5 8.6*   Liver Function Tests: No results for input(s): AST, ALT, ALKPHOS, BILITOT, PROT, ALBUMIN in the last 168 hours. Coagulation Profile: No results for input(s): INR, PROTIME in the last 168 hours. HbA1C: No results for input(s): HGBA1C in the last 72 hours. CBG: Recent Labs  Lab 05/25/19 0103 05/25/19 0204 05/25/19 0314 05/25/19 0422 05/25/19 0752  GLUCAP 136* 131* 139* 183* 238*    Recent Results (from the past 240 hour(s))  SARS CORONAVIRUS 2 (TAT 6-24 HRS) Nasopharyngeal Nasopharyngeal Swab     Status: None   Collection Time: 05/24/19  1:28 PM   Specimen: Nasopharyngeal Swab  Result Value Ref Range Status   SARS Coronavirus 2 NEGATIVE NEGATIVE Final    Comment: (NOTE) SARS-CoV-2 target nucleic acids are NOT DETECTED. The SARS-CoV-2 RNA is generally detectable in upper and lower respiratory specimens during the acute phase of infection. Negative results do  not preclude SARS-CoV-2 infection, do not rule out co-infections with other pathogens, and should not be used as the sole basis for treatment or other patient management decisions. Negative results must be combined with clinical observations, patient history, and epidemiological information. The expected result is Negative. Fact Sheet for Patients: SugarRoll.be Fact Sheet for Healthcare Providers: https://www.woods-mathews.com/ This test is not yet approved or cleared by the [place] FDA and  has been authorized for detection and/or diagnosis of SARS-CoV-2 by FDA under an Emergency Use Authorization (EUA). This EUA will remain  in effect (meaning this test can be used) for the duration of the COVID-19 declaration under Section 56 4(b)(1) of the Act, 21 U.S.C. section 360bbb-3(b)(1), unless the authorization is terminated or revoked sooner. Performed at [facility], [place] [address] [place], [place] [address]      Radiology Studies: Dg Chest 2 View  Result  Date: 05/24/2019 CLINICAL DATA:  Chest pain.  Chest congestion. EXAM: CHEST - 2 VIEW COMPARISON:  04/01/2019 FINDINGS: Midline trachea.  Normal heart size and mediastinal contours. Sharp costophrenic angles.  No pneumothorax.  Clear lungs. IMPRESSION: No active cardiopulmonary disease. Electronically Signed   By: Jeronimo GreavesKyle  Talbot M.D.   On: 05/24/2019 12:05   Time spent: 35 minutes, more than 50% at bedside discussing diabetic diet, tips and tricks to avoid highly processed carbohydrates and insulin regimen and CBG monitoring at home  Pamella Pertostin Gherghe, MD, PhD [facility]  Between 7 am - 7 pm I am available Contact me via Amion or [employer]

## 2019-05-25 NOTE — ED Notes (Signed)
Pt updated on room status and informed that she no longer needs an ICU bed and that she will be moved to a different floor when a bed is available. Pt states that she is hungry, and this nurse informed her that I will bring a meal tray as soon as they are available. This nurse provided pt with a drink and saltine crackers. Pt denies any further needs at this time.

## 2019-05-26 DIAGNOSIS — E0810 Diabetes mellitus due to underlying condition with ketoacidosis without coma: Secondary | ICD-10-CM

## 2019-05-26 LAB — BASIC METABOLIC PANEL
Anion gap: 11 (ref 5–15)
BUN: 14 mg/dL (ref 6–20)
CO2: 21 mmol/L — ABNORMAL LOW (ref 22–32)
Calcium: 8.6 mg/dL — ABNORMAL LOW (ref 8.9–10.3)
Chloride: 102 mmol/L (ref 98–111)
Creatinine, Ser: 0.97 mg/dL (ref 0.44–1.00)
GFR calc Af Amer: >60 mL/min (ref >60)
GFR calc non Af Amer: >60 mL/min (ref >60)
Glucose, Bld: 239 mg/dL — ABNORMAL HIGH (ref 70–99)
Potassium: 3.2 mmol/L — ABNORMAL LOW (ref 3.5–5.1)
Sodium: 134 mmol/L — ABNORMAL LOW (ref 135–145)

## 2019-05-26 LAB — CBC
HCT: 38.5 % (ref 36.0–46.0)
Hemoglobin: 13.8 g/dL (ref 12.0–15.0)
MCH: 30.3 pg (ref 26.0–34.0)
MCHC: 35.8 g/dL (ref 30.0–36.0)
MCV: 84.4 fL (ref 80.0–100.0)
Platelets: 211 10*3/uL (ref 150–400)
RBC: 4.56 MIL/uL (ref 3.87–5.11)
RDW: 12.7 % (ref 11.5–15.5)
WBC: 5.2 10*3/uL (ref 4.0–10.5)
nRBC: 0 % (ref 0.0–0.2)

## 2019-05-26 LAB — GLUCOSE, CAPILLARY: Glucose-Capillary: 245 mg/dL — ABNORMAL HIGH (ref 70–99)

## 2019-05-26 MED ORDER — BLOOD GLUCOSE METER KIT: PACK | 0 refills | Status: DC

## 2019-05-26 MED ORDER — POTASSIUM CHLORIDE CRYS ER 20 MEQ PO TBCR
30.0000 meq | EXTENDED_RELEASE_TABLET | Freq: Once | ORAL | Status: AC
  Administered 2019-05-26: 30 meq via ORAL
  Filled 2019-05-26: qty 1

## 2019-05-26 MED ORDER — INSULIN PEN NEEDLE 32G X 5 MM MISC: 30.0000 [IU] | Freq: Every day | 0 refills | Status: DC

## 2019-05-26 MED ORDER — NOVOLOG FLEXPEN 100 UNIT/ML ~~LOC~~ SOPN: 8.0000 [IU] | PEN_INJECTOR | Freq: Three times a day (TID) | SUBCUTANEOUS | 11 refills | Status: DC

## 2019-05-26 NOTE — Discharge Summary (Signed)
Physician Discharge Summary  Pamela Lane QJJ:[id number] DOB: 14-Dec-1960 DOA: 05/24/2019  PCP: [facility] [place] date: 05/24/2019 Discharge date: 05/26/2019  Admitted From: home Disposition:  home  Recommendations for Outpatient Follow-up:  1. Follow up with PCP in 1-2 weeks  Home Health: none Equipment/Devices: none  Discharge Condition: stable CODE STATUS: Full code Diet recommendation: diabetic  HPI: Per admitting MD, Pamela Lane is a 58 y.o. female with past medical history of diabetes and recurrent DKA presented to the hospital with generalized weakness, fatigue and few falls recently.  Patient stated that she has been feeling weak and malaise for last few days ever since she received her flu shot.  Recently, she had lost her balance and stumbled and had few falls.  Patient also complains of mild cough and occasional chest pain on coughing for the last 2 weeks.  She however denies any fever.  She stated that that she had Covid test done in the past which was negative. Patient denies any nausea, vomiting or abdominal pain at this time and states that he has normal appetite.  She has had a bowel movement. She denies any urinary urgency, frequency or dysuria.  Denies shortness of breath or dyspnea.  Patient is states that she is not very sure about her medications and has not taken her insulin at least for the last 1 day.  She states that is too much dealing with multiple medications for her. ED Course: In the ED, patient was noted to have a anion gap metabolic acidosis with hyperglycemia suggestive of DKA.  Patient did have normal mentation.  Patient was then considered for admission to the hospital after initiating insulin drip.  Hospital Course / Discharge diagnoses: Principal Problem DKA in the setting of poorly controlled type 2 diabetes mellitus with hyperglycemia  -patient was admitted to the hospital with DKA in the setting of poorly  controlled diabetes mellitus. Anion gap was 20 on admission, she was admitted to stepdown requiring insulin infusion along with fluids.  Her gap is closed and she was transitioned to subcutaneous insulin.  She gradually improved, she was able to tolerate a regular diet, she required additional insulin adjustments while hospitalized and was eventually discharged home in improved and stable condition.  Her short-acting insulin was increased as below.  It appears that she has difficulties following a diabetic diet, also does not appear to check her CBGs regularly and she believes that her home meter is malfunctioning.  Patient and I discussed extensively regarding diabetic diet, CBG monitoring and insulin administration at home.  She will be given a new prescription for a glucometer along with refills for her insulin.  She was advised to check her CBGs 4 times daily, keep a log, and follow-up with PCP within 1 to 2 weeks.  Active Problems Acute kidney injury -creatinine 2.3 on admission likely in the setting of dehydration and hyperglycemia, she was given IV fluids and by the time of discharge her creatinine normalized 0.97.  BUN 14. Hypertension -resume home medications History of bipolar disorder -Continue home medications Chronic neuropathic pain -Continue gabapentin   Discharge Instructions   Allergies as of 05/26/2019   No Known Allergies     Medication List    STOP taking these medications   blood glucose meter kit and supplies Kit     TAKE these medications   albuterol 108 (90 Base) MCG/ACT inhaler Commonly known as: VENTOLIN HFA Inhale 2 puffs into the lungs every 6 (  six) hours as needed for wheezing or shortness of breath.   aspirin EC 81 MG tablet Take 81 mg by mouth daily.   blood glucose meter kit and supplies Dispense based on patient and insurance preference. Use up to four times daily as directed. (FOR ICD-10 E10.9, E11.9).   buPROPion 150 MG 12 hr tablet Commonly  known as: WELLBUTRIN SR Take 150 mg by mouth 2 (two) times daily.   cetirizine 10 MG tablet Commonly known as: ZYRTEC Take 10 mg by mouth daily.   diltiazem 120 MG 24 hr capsule Commonly known as: CARDIZEM CD Take 1 capsule (120 mg total) by mouth 2 (two) times a day.   folic acid 1 MG tablet Commonly known as: FOLVITE Take 1 tablet (1 mg total) by mouth daily.   gabapentin 300 MG capsule Commonly known as: NEURONTIN Take 300 mg by mouth at bedtime.   glipiZIDE 5 MG 24 hr tablet Commonly known as: GLUCOTROL XL Take 1 tablet (5 mg total) by mouth daily with breakfast.   Insulin Glargine 100 UNIT/ML Solostar Pen Commonly known as: LANTUS Inject 30 Units into the skin daily.   Insulin Pen Needle 32G X 5 MM Misc 30 Units by Does not apply route daily.   ipratropium-albuterol 0.5-2.5 (3) MG/3ML Soln Commonly known as: DUONEB Take 3 mLs by nebulization every 6 (six) hours as needed for wheezing, shortness of breath or cough.   lisinopril 10 MG tablet Commonly known as: Prinivil Take 1 tablet (10 mg total) by mouth daily.   lurasidone 40 MG Tabs tablet Commonly known as: LATUDA Take 1 tablet (40 mg total) by mouth every evening.   metFORMIN 500 MG 24 hr tablet Commonly known as: GLUCOPHAGE-XR Take 500 mg by mouth 2 (two) times daily.   multivitamin with minerals Tabs tablet Take 1 tablet by mouth daily.   NovoLOG FlexPen 100 UNIT/ML FlexPen Generic drug: insulin aspart Inject 8 Units into the skin 3 (three) times daily with meals. What changed: how much to take   senna-docusate 8.6-50 MG tablet Commonly known as: Senokot-S Take 1 tablet by mouth at bedtime as needed for mild constipation.   thiamine 100 MG tablet Take 1 tablet (100 mg total) by mouth daily.      Follow-up [place], [facility]. Call in 1 week.   Specialty: General Practice Contact information: [place] [id number] [phone]            Consultations:  None   Procedures/Studies:  Dg Chest 2 View  Result Date: 05/24/2019 CLINICAL DATA:  Chest pain.  Chest congestion. EXAM: CHEST - 2 VIEW COMPARISON:  04/01/2019 FINDINGS: Midline trachea.  Normal heart size and mediastinal contours. Sharp costophrenic angles.  No pneumothorax.  Clear lungs. IMPRESSION: No active cardiopulmonary disease. Electronically Signed   By: Abigail Miyamoto M.D.   On: 05/24/2019 12:05     Subjective: - no chest pain, shortness of breath, no abdominal pain, nausea or vomiting.   Discharge Exam: BP 131/83 (BP Location: Right Arm)   Pulse 97   Temp 98.8 F (37.1 C) (Oral)   Resp 16   Ht '5\' 3"'  (1.6 m)   Wt 87.3 kg   SpO2 100%   BMI 34.09 kg/m   General: Pt is alert, awake, not in acute distress Cardiovascular: RRR, S1/S2 +, no rubs, no gallops Respiratory: CTA bilaterally, no wheezing, no rhonchi Abdominal: Soft, NT, ND, bowel sounds + Extremities: no edema, no cyanosis  The results of significant diagnostics from this hospitalization (including imaging, microbiology, ancillary and laboratory) are listed below for reference.     Microbiology: Recent Results (from the past 240 hour(s))  SARS CORONAVIRUS 2 (TAT 6-24 HRS) Nasopharyngeal Nasopharyngeal Swab     Status: None   Collection Time: 05/24/19  1:28 PM   Specimen: Nasopharyngeal Swab  Result Value Ref Range Status   SARS Coronavirus 2 NEGATIVE NEGATIVE Final    Comment: (NOTE) SARS-CoV-2 target nucleic acids are NOT DETECTED. The SARS-CoV-2 RNA is generally detectable in upper and lower respiratory specimens during the acute phase of infection. Negative results do not preclude SARS-CoV-2 infection, do not rule out co-infections with other pathogens, and should not be used as the sole basis for treatment or other patient management decisions. Negative results must be combined with clinical observations, patient history, and epidemiological information. The  expected result is Negative. Fact Sheet for Patients: SugarRoll.be Fact Sheet for Healthcare Providers: https://www.woods-mathews.com/ This test is not yet approved or cleared by the [place] FDA and  has been authorized for detection and/or diagnosis of SARS-CoV-2 by FDA under an Emergency Use Authorization (EUA). This EUA will remain  in effect (meaning this test can be used) for the duration of the COVID-19 declaration under Section 56 4(b)(1) of the Act, 21 U.S.C. section 360bbb-3(b)(1), unless the authorization is terminated or revoked sooner. Performed at [facility], [place] [address], [place], [place] [address]      Labs: Basic Metabolic Panel: Recent Labs  Lab 05/24/19 1115 05/24/19 2336 05/25/19 0602 05/26/19 0729  NA 125* 132* 132* 134*  K 4.2 3.3* 3.2* 3.2*  CL 93* 100 102 102  CO2 12* 20* 17* 21*  GLUCOSE 707* 184* 204* 239*  BUN 38* 30* 24* 14  CREATININE 2.33* 1.40* 1.12* 0.97  CALCIUM 9.5 9.5 8.6* 8.6*   Liver Function Tests: No results for input(s): AST, ALT, ALKPHOS, BILITOT, PROT, ALBUMIN in the last 168 hours. CBC: Recent Labs  Lab 05/24/19 1115 05/24/19 2336 05/26/19 0729  WBC 10.2 8.9 5.2  HGB 15.2* 14.7 13.8  HCT 43.9 41.4 38.5  MCV 87.3 84.7 84.4  PLT 303 267 211   CBG: Recent Labs  Lab 05/25/19 0752 05/25/19 1133 05/25/19 2057 05/25/19 2332 05/26/19 0817  GLUCAP 238* 341* 495* 383* 245*   Hgb A1c No results for input(s): HGBA1C in the last 72 hours. Lipid Profile No results for input(s): CHOL, HDL, LDLCALC, TRIG, CHOLHDL, LDLDIRECT in the last 72 hours. Thyroid function studies No results for input(s): TSH, T4TOTAL, T3FREE, THYROIDAB in the last 72 hours.  Invalid input(s): FREET3 Urinalysis    Component Value Date/Time   COLORURINE YELLOW (A) 05/24/2019 1422   APPEARANCEUR HAZY (A) 05/24/2019 1422   LABSPEC 1.023 05/24/2019 1422   PHURINE 5.0 05/24/2019 1422   GLUCOSEU  >=500 (A) 05/24/2019 1422   HGBUR NEGATIVE 05/24/2019 1422   BILIRUBINUR NEGATIVE 05/24/2019 1422   KETONESUR 20 (A) 05/24/2019 1422   PROTEINUR 30 (A) 05/24/2019 1422   NITRITE NEGATIVE 05/24/2019 1422   LEUKOCYTESUR NEGATIVE 05/24/2019 1422    FURTHER DISCHARGE INSTRUCTIONS:   Get Medicines reviewed and adjusted: Please take all your medications with you for your next visit with your Primary MD   Laboratory/radiological data: Please request your Primary MD to go over all hospital tests and procedure/radiological results at the follow up, please ask your Primary MD to get all Hospital records sent to his/her office.   In some cases, they will be  blood work, cultures and biopsy results pending at the time of your discharge. Please request that your primary care M.D. goes through all the records of your hospital data and follows up on these results.   Also Note the following: If you experience worsening of your admission symptoms, develop shortness of breath, life threatening emergency, suicidal or homicidal thoughts you must seek medical attention immediately by calling 911 or calling your MD immediately  if symptoms less severe.   You must read complete instructions/literature along with all the possible adverse reactions/side effects for all the Medicines you take and that have been prescribed to you. Take any new Medicines after you have completely understood and accpet all the possible adverse reactions/side effects.    Do not drive when taking Pain medications or sleeping medications (Benzodaizepines)   Do not take more than prescribed Pain, Sleep and Anxiety Medications. It is not advisable to combine anxiety,sleep and pain medications without talking with your primary care practitioner   Special Instructions: If you have smoked or chewed Tobacco  in the last 2 yrs please stop smoking, stop any regular Alcohol  and or any Recreational drug use.   Wear Seat belts while driving.    Please note: You were cared for by a hospitalist during your hospital stay. Once you are discharged, your primary care physician will handle any further medical issues. Please note that NO REFILLS for any discharge medications will be authorized once you are discharged, as it is imperative that you return to your primary care physician (or establish a relationship with a primary care physician if you do not have one) for your post hospital discharge needs so that they can reassess your need for medications and monitor your lab values.  Time coordinating discharge: 35 minutes  SIGNED:  Marzetta Board, MD, PhD 05/26/2019, 2:43 PM

## 2019-05-26 NOTE — Progress Notes (Signed)
[place] to be D/C'd home per MD order.  Discussed prescriptions and follow up appointments with the patient. Prescriptions given to patient, medication list explained in detail. Pt verbalized understanding.  Allergies as of 05/26/2019   No Known Allergies      Medication List     STOP taking these medications    blood glucose meter kit and supplies Kit       TAKE these medications    albuterol 108 (90 Base) MCG/ACT inhaler Commonly known as: VENTOLIN HFA Inhale 2 puffs into the lungs every 6 (six) hours as needed for wheezing or shortness of breath.   aspirin EC 81 MG tablet Take 81 mg by mouth daily.   blood glucose meter kit and supplies Dispense based on patient and insurance preference. Use up to four times daily as directed. (FOR ICD-10 E10.9, E11.9).   buPROPion 150 MG 12 hr tablet Commonly known as: WELLBUTRIN SR Take 150 mg by mouth 2 (two) times daily.   cetirizine 10 MG tablet Commonly known as: ZYRTEC Take 10 mg by mouth daily.   diltiazem 120 MG 24 hr capsule Commonly known as: CARDIZEM CD Take 1 capsule (120 mg total) by mouth 2 (two) times a day.   folic acid 1 MG tablet Commonly known as: FOLVITE Take 1 tablet (1 mg total) by mouth daily.   gabapentin 300 MG capsule Commonly known as: NEURONTIN Take 300 mg by mouth at bedtime.   glipiZIDE 5 MG 24 hr tablet Commonly known as: GLUCOTROL XL Take 1 tablet (5 mg total) by mouth daily with breakfast.   Insulin Glargine 100 UNIT/ML Solostar Pen Commonly known as: LANTUS Inject 30 Units into the skin daily.   Insulin Pen Needle 32G X 5 MM Misc 30 Units by Does not apply route daily.   ipratropium-albuterol 0.5-2.5 (3) MG/3ML Soln Commonly known as: DUONEB Take 3 mLs by nebulization every 6 (six) hours as needed for wheezing, shortness of breath or cough.   lisinopril 10 MG tablet Commonly known as: Prinivil Take 1 tablet (10 mg total) by mouth daily.   lurasidone 40 MG Tabs  tablet Commonly known as: LATUDA Take 1 tablet (40 mg total) by mouth every evening.   metFORMIN 500 MG 24 hr tablet Commonly known as: GLUCOPHAGE-XR Take 500 mg by mouth 2 (two) times daily.   multivitamin with minerals Tabs tablet Take 1 tablet by mouth daily.   NovoLOG FlexPen 100 UNIT/ML FlexPen Generic drug: insulin aspart Inject 8 Units into the skin 3 (three) times daily with meals. What changed: how much to take   senna-docusate 8.6-50 MG tablet Commonly known as: Senokot-S Take 1 tablet by mouth at bedtime as needed for mild constipation.   thiamine 100 MG tablet Take 1 tablet (100 mg total) by mouth daily.        Vitals:   05/26/19 0422 05/26/19 0820  BP: 111/69 131/83  Pulse: 90 97  Resp: 16 16  Temp: 98.1 F (36.7 C) 98.8 F (37.1 C)  SpO2: 97% 100%    Skin clean, dry and intact without evidence of skin break down, no evidence of skin tears noted. IV catheter discontinued intact. Site without signs and symptoms of complications. Dressing and pressure applied. Pt denies pain at this time. No complaints noted.  An After Visit Summary was printed and given to the patient. Patient escorted via [place], and D/C home via private auto.  [place] A Jerry Haugen

## 2019-05-26 NOTE — Discharge Instructions (Signed)
Blood Glucose Monitoring, Adult Monitoring your blood sugar (glucose) is an important part of managing your diabetes (diabetes mellitus). Blood glucose monitoring involves checking your blood glucose as often as directed and keeping a record (log) of your results over time. Checking your blood glucose regularly and keeping a blood glucose log can:  Help you and your health care provider adjust your diabetes management plan as needed, including your medicines or insulin.  Help you understand how food, exercise, illnesses, and medicines affect your blood glucose.  Let you know what your blood glucose is at any time. You can quickly find out if you have low blood glucose (hypoglycemia) or high blood glucose (hyperglycemia). Your health care provider will set individualized treatment goals for you. Your goals will be based on your age, other medical conditions you have, and how you respond to diabetes treatment. Generally, the goal of treatment is to maintain the following blood glucose levels:  Before meals (preprandial): 80-130 mg/dL (4.4-7.2 mmol/L).  After meals (postprandial): below 180 mg/dL (10 mmol/L).  A1c level: less than 7%. Supplies needed:  Blood glucose meter.  Test strips for your meter. Each meter has its own strips. You must use the strips that came with your meter.  A needle to prick your finger (lancet). Do not use a lancet more than one time.  A device that holds the lancet (lancing device).  A journal or log book to write down your results. How to check your blood glucose  1. Wash your hands with soap and water. 2. Prick the side of your finger (not the tip) with the lancet. Use a different finger each time. 3. Gently rub the finger until a small drop of blood appears. 4. Follow instructions that come with your meter for inserting the test strip, applying blood to the strip, and using your blood glucose meter. 5. Write down your result and any notes. Some meters  allow you to use areas of your body other than your finger (alternative sites) to test your blood. The most common alternative sites are:  Forearm.  Thigh.  Palm of the hand. If you think you may have hypoglycemia, or if you have a history of not knowing when your blood glucose is getting low (hypoglycemia unawareness), do not use alternative sites. Use your finger instead. Alternative sites may not be as accurate as the fingers, because blood flow is slower in these areas. This means that the result you get may be delayed, and it may be different from the result that you would get from your finger. Follow these instructions at home: Blood glucose log   Every time you check your blood glucose, write down your result. Also write down any notes about things that may be affecting your blood glucose, such as your diet and exercise for the day. This information can help you and your health care provider: ? Look for patterns in your blood glucose over time. ? Adjust your diabetes management plan as needed.  Check if your meter allows you to download your records to a computer. Most glucose meters store a record of glucose readings in the meter. If you have type 1 diabetes:  Check your blood glucose 2 or more times a day.  Also check your blood glucose: ? Before every insulin injection. ? Before and after exercise. ? Before meals. ? 2 hours after a meal. ? Occasionally between 2:00 a.m. and 3:00 a.m., as directed. ? Before potentially dangerous tasks, like driving or using heavy  machinery. ? At bedtime.  You may need to check your blood glucose more often, up to 6-10 times a day, if you: ? Use an insulin pump. ? Need multiple daily injections (MDI). ? Have diabetes that is not well-controlled. ? Are ill. ? Have a history of severe hypoglycemia. ? Have hypoglycemia unawareness. If you have type 2 diabetes:  If you take insulin or other diabetes medicines, check your blood glucose 2 or  more times a day.  If you are on intensive insulin therapy, check your blood glucose 4 or more times a day. Occasionally, you may also need to check between 2:00 a.m. and 3:00 a.m., as directed.  Also check your blood glucose: ? Before and after exercise. ? Before potentially dangerous tasks, like driving or using heavy machinery.  You may need to check your blood glucose more often if: ? Your medicine is being adjusted. ? Your diabetes is not well-controlled. ? You are ill. General tips  Always keep your supplies with you.  If you have questions or need help, all blood glucose meters have a 24-hour "hotline" phone number that you can call. You may also contact your health care provider.  After you use a few boxes of test strips, adjust (calibrate) your blood glucose meter by following instructions that came with your meter. Contact a health care provider if:  Your blood glucose is at or above 240 mg/dL (01.7 mmol/L) for 2 days in a row.  You have been sick or have had a fever for 2 days or longer, and you are not getting better.  You have any of the following problems for more than 6 hours: ? You cannot eat or drink. ? You have nausea or vomiting. ? You have diarrhea. Get help right away if:  Your blood glucose is lower than 54 mg/dL (3 mmol/L).  You become confused or you have trouble thinking clearly.  You have difficulty breathing.  You have moderate or large ketone levels in your urine. Summary  Monitoring your blood sugar (glucose) is an important part of managing your diabetes (diabetes mellitus).  Blood glucose monitoring involves checking your blood glucose as often as directed and keeping a record (log) of your results over time.  Your health care provider will set individualized treatment goals for you. Your goals will be based on your age, other medical conditions you have, and how you respond to diabetes treatment.  Every time you check your blood glucose,  write down your result. Also write down any notes about things that may be affecting your blood glucose, such as your diet and exercise for the day. This information is not intended to replace advice given to you by your health care provider. Make sure you discuss any questions you have with your health care provider. Document Released: 07/01/2003 Document Revised: 04/21/2018 Document Reviewed: 12/08/2015 Elsevier Patient Education  2020 [employer].   Diabetes Mellitus and Exercise Exercising regularly is important for your overall health, especially when you have diabetes (diabetes mellitus). Exercising is not only about losing weight. It has many other health benefits, such as increasing muscle strength and bone density and reducing body fat and stress. This leads to improved fitness, flexibility, and endurance, all of which result in better overall health. Exercise has additional benefits for people with diabetes, including:  Reducing appetite.  Helping to lower and control blood glucose.  Lowering blood pressure.  Helping to control amounts of fatty substances (lipids) in the blood, such as cholesterol and  triglycerides.  Helping the body to respond better to insulin (improving insulin sensitivity).  Reducing how much insulin the body needs.  Decreasing the risk for heart disease by: ? Lowering cholesterol and triglyceride levels. ? Increasing the levels of good cholesterol. ? Lowering blood glucose levels. What is my activity plan? Your health care provider or certified diabetes educator can help you make a plan for the type and frequency of exercise (activity plan) that works for you. Make sure that you:  Do at least 150 minutes of moderate-intensity or vigorous-intensity exercise each week. This could be brisk walking, biking, or water aerobics. ? Do stretching and strength exercises, such as yoga or weightlifting, at least 2 times a week. ? Spread out your activity over at  least 3 days of the week.  Get some form of physical activity every day. ? Do not go more than 2 days in a row without some kind of physical activity. ? Avoid being inactive for more than 30 minutes at a time. Take frequent breaks to walk or stretch.  Choose a type of exercise or activity that you enjoy, and set realistic goals.  Start slowly, and gradually increase the intensity of your exercise over time. What do I need to know about managing my diabetes?   Check your blood glucose before and after exercising. ? If your blood glucose is 240 mg/dL (13.3 mmol/L) or higher before you exercise, check your urine for ketones. If you have ketones in your urine, do not exercise until your blood glucose returns to normal. ? If your blood glucose is 100 mg/dL (5.6 mmol/L) or lower, eat a snack containing 15-20 grams of carbohydrate. Check your blood glucose 15 minutes after the snack to make sure that your level is above 100 mg/dL (5.6 mmol/L) before you start your exercise.  Know the symptoms of low blood glucose (hypoglycemia) and how to treat it. Your risk for hypoglycemia increases during and after exercise. Common symptoms of hypoglycemia can include: ? Hunger. ? Anxiety. ? Sweating and feeling clammy. ? Confusion. ? Dizziness or feeling light-headed. ? Increased heart rate or palpitations. ? Blurry vision. ? Tingling or numbness around the mouth, lips, or tongue. ? Tremors or shakes. ? Irritability.  Keep a rapid-acting carbohydrate snack available before, during, and after exercise to help prevent or treat hypoglycemia.  Avoid injecting insulin into areas of the body that are going to be exercised. For example, avoid injecting insulin into: ? The arms, when playing tennis. ? The legs, when jogging.  Keep records of your exercise habits. Doing this can help you and your health care provider adjust your diabetes management plan as needed. Write down: ? Food that you eat before and  after you exercise. ? Blood glucose levels before and after you exercise. ? The type and amount of exercise you have done. ? When your insulin is expected to peak, if you use insulin. Avoid exercising at times when your insulin is peaking.  When you start a new exercise or activity, work with your health care provider to make sure the activity is safe for you, and to adjust your insulin, medicines, or food intake as needed.  Drink plenty of water while you exercise to prevent dehydration or heat stroke. Drink enough fluid to keep your urine clear or pale yellow. Summary  Exercising regularly is important for your overall health, especially when you have diabetes (diabetes mellitus).  Exercising has many health benefits, such as increasing muscle strength and bone density  and reducing body fat and stress.  Your health care provider or certified diabetes educator can help you make a plan for the type and frequency of exercise (activity plan) that works for you.  When you start a new exercise or activity, work with your health care provider to make sure the activity is safe for you, and to adjust your insulin, medicines, or food intake as needed. This information is not intended to replace advice given to you by your health care provider. Make sure you discuss any questions you have with your health care provider. Document Released: 09/18/2003 Document Revised: 01/20/2017 Document Reviewed: 12/08/2015 Elsevier Patient Education  2020 [employer]   Diabetes Mellitus and Sick Day Management Blood sugar (glucose) can be difficult to control when you are sick. Common illnesses that can cause problems for people with diabetes (diabetes mellitus) include colds, fever, flu (influenza), nausea, vomiting, and diarrhea. These illnesses can cause stress and loss of body fluids (dehydration), and those issues can cause blood glucose levels to increase. Because of this, it is very important to take your  insulin and diabetes medicines and eat some form of carbohydrate when you are sick. You should make a plan for days when you are sick (sick day plan) as part of your diabetes management plan. You and your health care provider should make this plan in advance. The following guidelines are intended to help you manage an illness that lasts for about 24 hours or less. Your health care provider may also give you more specific instructions. What do I need to do to manage my blood glucose?   Check your blood glucose every 2-4 hours, or as often as told by your health care provider.  Know your sick day treatment goals. Your target blood glucose levels may be different when you are sick.  If you use insulin, take your usual dose. ? If your blood glucose continues to be too high, you may need to take an additional insulin dose as told by your health care provider.  If you use oral diabetes medicine, you may need to stop taking it if you are not able to eat or drink normally. Ask your health care provider about whether you need to stop taking these medicines while you are sick.  If you use injectable hormone medicines other than insulin to control your diabetes, ask your health care provider about whether you need to stop taking these medicines while you are sick. What else can I do to manage my diabetes when I am sick? Check your ketones  If you have type 1 diabetes, check your urine ketones every 4 hours.  If you have type 2 diabetes, check your urine ketones as often as told by your health care provider. Drink fluids  Drink enough fluid to keep your urine clear or pale yellow. This is especially important if you have a fever, vomiting, or diarrhea. Those symptoms can lead to dehydration.  Follow any instructions from your health care provider about beverages to avoid. ? Do not drink alcohol, caffeine, or drinks that contain a lot of sugar. Take medicines as directed  Take-over-the-counter and  prescription medicines only as told by your health care provider.  Check medicine labels for added sugars. Some medicines may contain sugar or types of sugars that can raise your blood glucose level. What foods can I eat when I am sick?  You need to eat some form of carbohydrates when you are sick. You should eat 45-50 grams (45-50  g) of carbohydrates every 3-4 hours until you feel better. All of the food choices below contain about 15 g of carbohydrates. Plan ahead and keep some of these foods around so you have them if you get sick.  4-6 oz (120-177 mL) carbonated beverage that contains sugar, such as regular (not diet) soda. You may be able to drink carbonated beverages more easily if you open the beverage and let it sit at room temperature for a few minutes before drinking.   of a twin frozen ice pop.  4 oz (120 g) regular gelatin.  4 oz (120 mL) fruit juice.  4 oz (120 g) ice cream or frozen yogurt.  2 oz (60 g) sherbet.  8 oz (240 mL) clear broth or soup.  4 oz (120 g) regular custard.  4 oz (120 g) regular pudding.  8 oz (240 g) plain yogurt.  1 slice bread or toast.  6 saltine crackers.  5 vanilla wafers. Questions to ask your health care provider Consider asking the following questions so you know what to do on days when you are sick:  Should I adjust my diabetes medicines?  How often do I need to check my blood glucose?  What supplies do I need to manage my diabetes at home when I am sick?  What number can I call if I have questions?  What foods and drinks should I avoid? Contact a health care provider if:  You develop symptoms of diabetic ketoacidosis, such as: ? Fatigue. ? Weight loss. ? Excessive thirst. ? Light-headedness. ? Fruity or sweet-smelling breath. ? Excessive urination. ? Vision changes. ? Confusion or irritability. ? Nausea. ? Vomiting. ? Rapid breathing. ? Pain in the abdomen. ? Feeling flushed.  You are unable to drink fluids  without vomiting.  You have any of the following for more than 6 hours: ? Nausea. ? Vomiting. ? Diarrhea.  Your blood glucose is at or above 240 mg/dL (79.0 mmol/L), even after you take an additional insulin dose.  You have a change in how you think, feel, or act (mental status).  You develop another serious illness.  You have been sick or have had a fever for 2 days or longer and you are not getting better. Get help right away if:  Your blood glucose is lower than 54 mg/dL (3.0 mmol/L).  You have difficulty breathing.  You have moderate or high ketone levels in your urine.  You used emergency glucagon to treat low blood glucose. Summary  Blood sugar (glucose) can be difficult to control when you are sick. Common illnesses that can cause problems for people with diabetes (diabetes mellitus) include colds, fever, flu (influenza), nausea, vomiting, and diarrhea.  Illnesses can cause stress and loss of body fluids (dehydration), and those issues can cause blood glucose levels to increase.  Make a plan for days when you are sick (sick day plan) as part of your diabetes management plan. You and your health care provider should make this plan in advance.  It is very important to take your insulin and diabetes medicines and to eat some form of carbohydrate when you are sick.  Contact your health care provider if have problems managing your blood glucose levels when you are sick, or if you have been sick or had a fever for 2 days or longer and are not getting better. This information is not intended to replace advice given to you by your health care provider. Make sure you discuss any questions you have  with your health care provider. Document Released: 07/01/2003 Document Revised: 03/26/2016 Document Reviewed: 03/26/2016 Elsevier Patient Education  2020 [employer]   Diabetes Mellitus and Foot Care Foot care is an important part of your health, especially when you have diabetes.  Diabetes may cause you to have problems because of poor blood flow (circulation) to your feet and legs, which can cause your skin to:  Become thinner and drier.  Break more easily.  Heal more slowly.  Peel and crack. You may also have nerve damage (neuropathy) in your legs and feet, causing decreased feeling in them. This means that you may not notice minor injuries to your feet that could lead to more serious problems. Noticing and addressing any potential problems early is the best way to prevent future foot problems. How to care for your feet Foot hygiene  Wash your feet daily with warm water and mild soap. Do not use hot water. Then, pat your feet and the areas between your toes until they are completely dry. Do not soak your feet as this can dry your skin.  Trim your toenails straight across. Do not dig under them or around the cuticle. File the edges of your nails with an emery board or nail file.  Apply a moisturizing lotion or petroleum jelly to the skin on your feet and to dry, brittle toenails. Use lotion that does not contain alcohol and is unscented. Do not apply lotion between your toes. Shoes and socks  Wear clean socks or stockings every day. Make sure they are not too tight. Do not wear knee-high stockings since they may decrease blood flow to your legs.  Wear shoes that fit properly and have enough cushioning. Always look in your shoes before you put them on to be sure there are no objects inside.  To break in new shoes, wear them for just a few hours a day. This prevents injuries on your feet. Wounds, scrapes, corns, and calluses  Check your feet daily for blisters, cuts, bruises, sores, and redness. If you cannot see the bottom of your feet, use a mirror or ask someone for help.  Do not cut corns or calluses or try to remove them with medicine.  If you find a minor scrape, cut, or break in the skin on your feet, keep it and the skin around it clean and dry. You may  clean these areas with mild soap and water. Do not clean the area with peroxide, alcohol, or iodine.  If you have a wound, scrape, corn, or callus on your foot, look at it several times a day to make sure it is healing and not infected. Check for: ? Redness, swelling, or pain. ? Fluid or blood. ? Warmth. ? Pus or a bad smell. General instructions  Do not cross your legs. This may decrease blood flow to your feet.  Do not use heating pads or hot water bottles on your feet. They may burn your skin. If you have lost feeling in your feet or legs, you may not know this is happening until it is too late.  Protect your feet from hot and cold by wearing shoes, such as at the beach or on hot pavement.  Schedule a complete foot exam at least once a year (annually) or more often if you have foot problems. If you have foot problems, report any cuts, sores, or bruises to your health care provider immediately. Contact a health care provider if:  You have a medical condition that  increases your risk of infection and you have any cuts, sores, or bruises on your feet.  You have an injury that is not healing.  You have redness on your legs or feet.  You feel burning or tingling in your legs or feet.  You have pain or cramps in your legs and feet.  Your legs or feet are numb.  Your feet always feel cold.  You have pain around a toenail. Get help right away if:  You have a wound, scrape, corn, or callus on your foot and: ? You have pain, swelling, or redness that gets worse. ? You have fluid or blood coming from the wound, scrape, corn, or callus. ? Your wound, scrape, corn, or callus feels warm to the touch. ? You have pus or a bad smell coming from the wound, scrape, corn, or callus. ? You have a fever. ? You have a red line going up your leg. Summary  Check your feet every day for cuts, sores, red spots, swelling, and blisters.  Moisturize feet and legs daily.  Wear shoes that fit  properly and have enough cushioning.  If you have foot problems, report any cuts, sores, or bruises to your health care provider immediately.  Schedule a complete foot exam at least once a year (annually) or more often if you have foot problems. This information is not intended to replace advice given to you by your health care provider. Make sure you discuss any questions you have with your health care provider. Document Released: 06/25/2000 Document Revised: 08/10/2017 Document Reviewed: 07/30/2016 Elsevier Patient Education  2020 [employer]   Insulin Treatment for Diabetes Mellitus Diabetes (diabetes mellitus) is a long-term (chronic) disease. It occurs when the body does not properly use sugar (glucose) that is released from food after digestion. Glucose levels are controlled by a hormone called insulin. Insulin is made in the pancreas, which is an organ behind the stomach.  If you have type 1 diabetes, you must take insulin because your pancreas does not make any.  If you have type 2 diabetes, you might need to take insulin along with other medicines. In type 2 diabetes, one or both of these problems may be present: ? The pancreas does not make enough insulin. ? Cells in the body do not respond properly to insulin that the body makes (insulin resistance). You must use insulin correctly to control your diabetes. You must have some insulin in your body at all times. Insulin treatment varies depending on your type of diabetes, your treatment goals, and your medical history. Ask questions to understand your insulin treatment plan so you can be an active partner in managing your diabetes. How is insulin given? Insulin can only be given through a shot (injection). It is injected using a syringe and needle, an insulin pen, a pump, or a jet injector. Your health care provider will:  Prescribe the type and amount of insulin that you need.  Tell you when you should inject your insulin. Where  on the body should insulin be injected? Insulin is injected into a layer of fatty tissue under the skin. Good places to inject insulin include:  Abdomen. Generally, the abdomen is the best place to inject insulin. However, you should avoid any area that is less than 2 inches (5 cm) from the belly button (navel).  Front of thigh.  Upper, outer side of thigh.  Upper, outer side of arm.  Upper, outer part of buttock. It is important to:  Give  your injection in a slightly different place each time. This helps to prevent irritation and improve absorption.  Avoid injecting into areas that have scar tissue. Usually, you will give yourself insulin injections. Others can also be taught how to give you injections. You will use a special type of syringe that is made only for insulin. Some people may have an insulin pump that delivers insulin steadily through a tube (cannula) that is placed under the skin. What are the different types of insulin? The following information is a general guide to different types of insulin. Specifics vary depending on the insulin product that your health care provider prescribes.  Rapid-acting insulin: ? Starts working quickly, in as little as 5 minutes. ? Can last for 4-6 hours (or sometimes longer). ? Works well when taken right before a meal to quickly lower your blood glucose.  Short-acting insulin: ? Starts working in about 30 minutes. ? Can last for 6-10 hours. ? Should be taken about 30 minutes before you start eating a meal.  Intermediate-acting insulin: ? Starts working in 1-2 hours. ? Lasts for about 10-18 hours. ? Lowers your blood glucose for a longer period of time, but it is not as effective for lowering blood glucose right after a meal.  Long-acting insulin: ? Mimics the small amount of insulin that your pancreas usually produces throughout the day. ? Should be used one or two times a day. ? Is usually used in combination with other types of  insulin or other medicines.  Concentrated insulin, or U-500 insulin: ? Contains a higher dose of insulin than most rapid-acting insulins. U-500 insulin has 5 times the amount of insulin per 1 mL. ? Should only be used with the special U-500 syringe or U-500 insulin pen. It is dangerous to use the wrong type of syringe with this insulin. What are the side effects of insulin? Possible side effects of insulin treatment include:  Low blood glucose (hypoglycemia).  Weight gain.  High blood glucose (hyperglycemia).  Skin injury or irritation. Some of these side effects can be caused by using improper injection technique. Be sure to learn how to inject insulin properly. What are common terms associated with insulin treatment? Some terms that you might hear include:  Basal insulin, or basal rate. This is the constant amount of insulin that needs to be present in your body to keep your blood glucose levels stable. People who have type 1 diabetes need basal insulin in a steady (continuous) dose 24 hours a day. ? Usually, intermediate-acting or long-acting insulin is used one or two times a day to manage basal insulin levels. ? Medicines that are taken by mouth may also be recommended to manage basal insulin levels.  Prandial or nutrition insulin. This refers to meal-related insulin. ? Blood glucose rises quickly after a meal (postprandial). Rapid-acting or short-acting insulin can be used right before a meal (preprandial) to quickly lower your blood glucose. ? You may be instructed to adjust the amount of prandial insulin that you take, based on how much carbohydrate (starch) is in your meal.  Corrective insulin. This may also be called a correction dose or supplemental dose. This is a small amount of rapid-acting or short-acting insulin that can be used to lower your blood glucose if it is too high. You may be instructed to check your blood glucose at certain times of the day and use corrective  insulin as needed.  Tight control, or intensive therapy. This means keeping your blood glucose as  close to your target as possible, and preventing your blood glucose from getting too high after meals. People who have tight control of their diabetes have fewer long-term problems caused by diabetes. Follow these instructions at home: Talk with your health care provider or pharmacist about the type of insulin you should take and when you should take it. You should know when your insulin goes up the most (peaks) and when it wears off. You need this information so you can plan your meals and exercise. Work with your health care provider to:  Check your blood glucose every day. Your health care provider will tell you how often and when you should do this.  Manage your: ? Weight. ? Blood pressure. ? Cholesterol. ? Stress.  Eat a healthy diet.  Exercise regularly. Summary  Diabetes is a long-term (chronic) disease. It occurs when the body does not properly use sugar (glucose) that is released from food after digestion. Glucose levels are controlled by a hormone called insulin, which is made in an organ behind your stomach (pancreas).  You must use insulin correctly to control your diabetes. You must have some insulin in your body at all times.  Insulin treatment varies depending on your type of diabetes, your treatment goals, and your medical history.  Talk with your health care provider or pharmacist about the type of insulin you should take and when you should take it.  Check your blood glucose every day. Your health care provider will tell you how often and when you should check it. This information is not intended to replace advice given to you by your health care provider. Make sure you discuss any questions you have with your health care provider. Document Released: 09/24/2008 Document Revised: 07/01/2017 Document Reviewed: 08/01/2015 Elsevier Patient Education  [place].   Insulin Storage and Care  All insulin pens and bottles (vials) have expiration dates. Insulin pens and vials that are refrigerated and unopened are good until the expiration date. Once opened, vials are good for 28 days. "Opened" means that the rubber has been punctured. Once in use, pen expiration dates vary depending on the type of insulin being used. Do not use insulin after the expiration date. Always follow the instructions that come with your insulin. How to store your insulin  Store your insulin according to instructions on the packaging.  If insulin is kept at room temperature, keep the temperature between 56-43F (13-27C). ? Opened insulin vials may be kept at room temperature, or in the refrigerator and warmed to room temperature before use. ? Opened insulin pens should be kept at room temperature.  If insulin is kept in the refrigerator, keep the temperature between 36-24F (3-8C).  Do not freeze insulin.  Keep insulin away from direct heat or sunlight. How to throw away your supplies  Throw away your insulin if: ? It is discolored. ? It is thick. ? It has clumps in it. ? It has white particles suspended in it.  Discard all used needles in a puncture-proof sharps disposal container. You can ask your local pharmacy about where you can get this kind of disposal container, or you can use an empty liquid laundry detergent bottle that has a lid, for example.  Follow the disposal regulations for the area where you live.  Do not use any syringe or needle more than one time.  Throw away empty vials and pens (with needles removed) in the regular trash. General recommendations  Always keep extra insulin and supplies  with you.  Always inspect your insulin prior to injecting. Do not use if you notice any discoloration, particles, or clumping.  Mix cloudy insulin by gently rolling the vial between your hands or "rocking" the pen from end to end at least 10 times.  Do  not leave insulin in your vehicle or in any place where it can get too hot or too cold.  Use an insulated travel pack to store your insulin vials or pens when traveling. Where to find more information  American Diabetes Association: www.diabetes.[employer] of Diabetes and Digestive and Kidney Diseases: StreamsVideo.gl Summary  Do not store insulin in extreme heat or cold, such as in the freezer, direct sunlight, or your vehicle.  Check the expiration date before using insulin and do not use insulin past the expiration date.  Check insulin before using to make sure it looks normal. Mix cloudy insulin before using. Do not use insulin if it is discolored, has particles, or clumps. This information is not intended to replace advice given to you by your health care provider. Make sure you discuss any questions you have with your health care provider. Document Released: 04/25/2009 Document Revised: 08/05/2016 Document Reviewed: 08/05/2016 Elsevier Patient Education  2020 [employer]. Follow with [facility] in 5-7 days  Please get a complete blood count and chemistry panel checked by your Primary MD at your next visit, and again as instructed by your Primary MD. Please get your medications reviewed and adjusted by your Primary MD.  Please request your Primary MD to go over all Hospital Tests and Procedure/Radiological results at the follow up, please get all Hospital records sent to your Prim MD by signing hospital release before you go home.  In some cases, there will be blood work, cultures and biopsy results pending at the time of your discharge. Please request that your primary care M.D. goes through all the records of your hospital data and follows up on these results.  If you had Pneumonia of Lung problems at the Hospital: Please get a 2 view Chest X ray done in 6-8 weeks after hospital discharge or sooner if  instructed by your Primary MD.  If you have Congestive Heart Failure: Please call your Cardiologist or Primary MD anytime you have any of the following symptoms:  1) 3 pound weight gain in 24 hours or 5 pounds in 1 week  2) shortness of breath, with or without a dry hacking cough  3) swelling in the hands, feet or stomach  4) if you have to sleep on extra pillows at night in order to breathe  Follow cardiac low salt diet and 1.5 lit/day fluid restriction.  If you have diabetes Accuchecks 4 times/day, Once in AM empty stomach and then before each meal. Log in all results and show them to your primary doctor at your next visit. If any glucose reading is under 80 or above 300 call your primary MD immediately.  If you have Seizure/Convulsions/Epilepsy: Please do not drive, operate heavy machinery, participate in activities at heights or participate in high speed sports until you have seen by Primary MD or a Neurologist and advised to do so again. Per [facility] statutes, patients with seizures are not allowed to drive until they have been seizure-free for six months.  Use caution when using heavy equipment or power tools. Avoid working on ladders or at heights. Take showers instead of baths. Ensure the water temperature is not too high on  the home water heater. Do not go swimming alone. Do not lock yourself in a room alone (i.e. bathroom). When caring for infants or small children, sit down when holding, feeding, or changing them to minimize risk of injury to the child in the event you have a seizure. Maintain good sleep hygiene. Avoid alcohol.   If you had Gastrointestinal Bleeding: Please ask your Primary MD to check a complete blood count within one week of discharge or at your next visit. Your endoscopic/colonoscopic biopsies that are pending at the time of discharge, will also need to followed by your Primary MD.  Get Medicines reviewed and adjusted. Please take all your medications  with you for your next visit with your Primary MD  Please request your Primary MD to go over all hospital tests and procedure/radiological results at the follow up, please ask your Primary MD to get all Hospital records sent to his/her office.  If you experience worsening of your admission symptoms, develop shortness of breath, life threatening emergency, suicidal or homicidal thoughts you must seek medical attention immediately by calling 911 or calling your MD immediately  if symptoms less severe.  You must read complete instructions/literature along with all the possible adverse reactions/side effects for all the Medicines you take and that have been prescribed to you. Take any new Medicines after you have completely understood and accpet all the possible adverse reactions/side effects.   Do not drive or operate heavy machinery when taking Pain medications.   Do not take more than prescribed Pain, Sleep and Anxiety Medications  Special Instructions: If you have smoked or chewed Tobacco  in the last 2 yrs please stop smoking, stop any regular Alcohol  and or any Recreational drug use.  Wear Seat belts while driving.  Please note You were cared for by a hospitalist during your hospital stay. If you have any questions about your discharge medications or the care you received while you were in the hospital after you are discharged, you can call the unit and asked to speak with the hospitalist on call if the hospitalist that took care of you is not available. Once you are discharged, your primary care physician will handle any further medical issues. Please note that NO REFILLS for any discharge medications will be authorized once you are discharged, as it is imperative that you return to your primary care physician (or establish a relationship with a primary care physician if you do not have one) for your aftercare needs so that they can reassess your need for medications and monitor your lab  values.  You can reach the hospitalist office at phone [phone] or fax [phone]   If you do not have a primary care physician, you can call [phone] for a physician referral.  Activity: As tolerated with Full fall precautions use walker/cane & assistance as needed    Diet: diabetic  Disposition Home

## 2019-05-26 NOTE — Progress Notes (Signed)
   05/26/19 1000  Clinical Encounter Type  Visited With Patient  Visit Type Initial  Referral From Nurse  Ch received an OR for AD. Education provided and document delivered. PT wants to make her two oldest daughters her 33 and would like to discuss the document with her children. Pt plans to finalize it when she gets discharged from the hospital.

## 2019-05-27 LAB — GLUCOSE, CAPILLARY
Glucose-Capillary: 307 mg/dL — ABNORMAL HIGH (ref 70–99)
Glucose-Capillary: 244 mg/dL — ABNORMAL HIGH (ref 70–99)
Glucose-Capillary: 285 mg/dL — ABNORMAL HIGH (ref 70–99)

## 2019-06-04 LAB — BLOOD GAS, VENOUS
Acid-base deficit: 7 mmol/L — ABNORMAL HIGH (ref 0.0–2.0)
Bicarbonate: 19.7 mmol/L — ABNORMAL LOW (ref 20.0–28.0)
O2 Saturation: 17.9 %
Patient temperature: 37
pCO2, Ven: 43 mmHg — ABNORMAL LOW (ref 44.0–60.0)
pH, Ven: 7.27 (ref 7.250–7.430)

## 2019-06-14 ENCOUNTER — Encounter: Payer: Self-pay | Admitting: Emergency Medicine

## 2019-06-14 ENCOUNTER — Other Ambulatory Visit: Payer: Self-pay

## 2019-06-14 ENCOUNTER — Emergency Department
Admission: EM | Admit: 2019-06-14 | Discharge: 2019-06-14 | Disposition: A | Discharge status: Home / Self Care | Payer: Medicaid Other | Attending: Emergency Medicine | Admitting: Emergency Medicine

## 2019-06-14 DIAGNOSIS — J45909 Unspecified asthma, uncomplicated: Secondary | ICD-10-CM | POA: Diagnosis not present

## 2019-06-14 DIAGNOSIS — F17210 Nicotine dependence, cigarettes, uncomplicated: Secondary | ICD-10-CM | POA: Insufficient documentation

## 2019-06-14 DIAGNOSIS — Z794 Long term (current) use of insulin: Secondary | ICD-10-CM | POA: Insufficient documentation

## 2019-06-14 DIAGNOSIS — I10 Essential (primary) hypertension: Secondary | ICD-10-CM | POA: Insufficient documentation

## 2019-06-14 DIAGNOSIS — Z7982 Long term (current) use of aspirin: Secondary | ICD-10-CM | POA: Insufficient documentation

## 2019-06-14 DIAGNOSIS — R739 Hyperglycemia, unspecified: Secondary | ICD-10-CM

## 2019-06-14 DIAGNOSIS — E1165 Type 2 diabetes mellitus with hyperglycemia: Secondary | ICD-10-CM | POA: Insufficient documentation

## 2019-06-14 LAB — BLOOD GAS, VENOUS
Acid-base deficit: 5.2 mmol/L — ABNORMAL HIGH (ref 0.0–2.0)
Bicarbonate: 20.6 mmol/L (ref 20.0–28.0)
O2 Saturation: 31.7 %
Patient temperature: 37
pCO2, Ven: 40 mmHg — ABNORMAL LOW (ref 44.0–60.0)
pH, Ven: 7.32 (ref 7.250–7.430)
pO2, Ven: <31 mmHg — CL (ref 32.0–45.0)

## 2019-06-14 LAB — BASIC METABOLIC PANEL
Anion gap: 17 — ABNORMAL HIGH (ref 5–15)
Anion gap: 7 (ref 5–15)
BUN: 39 mg/dL — ABNORMAL HIGH (ref 6–20)
BUN: 26 mg/dL — ABNORMAL HIGH (ref 6–20)
CO2: 18 mmol/L — ABNORMAL LOW (ref 22–32)
CO2: 20 mmol/L — ABNORMAL LOW (ref 22–32)
Calcium: 8.8 mg/dL — ABNORMAL LOW (ref 8.9–10.3)
Calcium: 7.9 mg/dL — ABNORMAL LOW (ref 8.9–10.3)
Chloride: 92 mmol/L — ABNORMAL LOW (ref 98–111)
Chloride: 108 mmol/L (ref 98–111)
Creatinine, Ser: 1.98 mg/dL — ABNORMAL HIGH (ref 0.44–1.00)
Creatinine, Ser: 1.38 mg/dL — ABNORMAL HIGH (ref 0.44–1.00)
GFR calc Af Amer: 32 mL/min — ABNORMAL LOW (ref >60)
GFR calc Af Amer: 49 mL/min — ABNORMAL LOW (ref >60)
GFR calc non Af Amer: 27 mL/min — ABNORMAL LOW (ref >60)
GFR calc non Af Amer: 42 mL/min — ABNORMAL LOW (ref >60)
Glucose, Bld: 516 mg/dL (ref 70–99)
Glucose, Bld: 82 mg/dL (ref 70–99)
Potassium: 3.8 mmol/L (ref 3.5–5.1)
Potassium: 3.2 mmol/L — ABNORMAL LOW (ref 3.5–5.1)
Sodium: 127 mmol/L — ABNORMAL LOW (ref 135–145)
Sodium: 135 mmol/L (ref 135–145)

## 2019-06-14 LAB — CBC
HCT: 37.5 % (ref 36.0–46.0)
Hemoglobin: 12.8 g/dL (ref 12.0–15.0)
MCH: 30.4 pg (ref 26.0–34.0)
MCHC: 34.1 g/dL (ref 30.0–36.0)
MCV: 89.1 fL (ref 80.0–100.0)
Platelets: 314 10*3/uL (ref 150–400)
RBC: 4.21 MIL/uL (ref 3.87–5.11)
RDW: 13.2 % (ref 11.5–15.5)
WBC: 8.9 10*3/uL (ref 4.0–10.5)
nRBC: 0 % (ref 0.0–0.2)

## 2019-06-14 LAB — URINALYSIS, COMPLETE (UACMP) WITH MICROSCOPIC
Bacteria, UA: NONE SEEN
Bilirubin Urine: NEGATIVE
Glucose, UA: >=500 mg/dL — AB
Hgb urine dipstick: NEGATIVE
Ketones, ur: 20 mg/dL — AB
Nitrite: NEGATIVE
Protein, ur: NEGATIVE mg/dL
Specific Gravity, Urine: 1.022 (ref 1.005–1.030)
pH: 5 (ref 5.0–8.0)

## 2019-06-14 LAB — GLUCOSE, CAPILLARY
Glucose-Capillary: 510 mg/dL (ref 70–99)
Glucose-Capillary: 324 mg/dL — ABNORMAL HIGH (ref 70–99)
Glucose-Capillary: 115 mg/dL — ABNORMAL HIGH (ref 70–99)

## 2019-06-14 MED ORDER — SODIUM CHLORIDE 0.9 % IV BOLUS
1000.0000 mL | Freq: Once | INTRAVENOUS | Status: AC
  Administered 2019-06-14: 1000 mL via INTRAVENOUS

## 2019-06-14 MED ORDER — SODIUM CHLORIDE 0.9 % IV BOLUS
1000.0000 mL | Freq: Once | INTRAVENOUS | Status: AC
  Administered 2019-06-14: 18:00:00 1000 mL via INTRAVENOUS

## 2019-06-14 MED ORDER — INSULIN ASPART 100 UNIT/ML ~~LOC~~ SOLN
10.0000 [IU] | Freq: Once | SUBCUTANEOUS | Status: AC
  Administered 2019-06-14: 10 [IU] via INTRAVENOUS
  Filled 2019-06-14: qty 1

## 2019-06-14 NOTE — ED Provider Notes (Signed)
[facility] Emergency Department Provider Note  Time seen: 6:09 PM  I have reviewed the triage vital signs and the nursing notes.   HISTORY  Chief Complaint Hyperglycemia   HPI Pamela Lane is a 58 y.o. female with a past medical history of diabetes, hypertension, frequent episodes of DKA presents to the emergency department for "high sugars."  According to the patient over the past 2 to 3 days her blood sugars been running in the 4 and 500 at home, today it read "high" so the patient came to the emergency department for evaluation.  Overall patient appears well, denies missing any doses of her medication.  She is asking me for something to eat and drink in the emergency department.  Patient does have frequent episodes of DKA and hyperglycemia.   Denies any fever, shortness of breath, abdominal pain.  Past Medical History:  Diagnosis Date  . Asthma   . Clostridium difficile colitis   . Diabetes mellitus without complication ([place])   . Hypertension   . Mental disorder    pt reports 'I have all of them mental disorders'    Patient Active Problem List   Diagnosis Date Noted  . Bipolar disorder ([place]) 01/17/2019  . Acute pancreatitis 05/15/2016  . AKI (acute kidney injury) ([place])   . Diabetic ketoacidosis without coma associated with type 2 diabetes mellitus ([place])   . Hyperglycemia due to type 2 diabetes mellitus ([place]) 01/13/2016  . UTI (urinary tract infection) 01/13/2016  . DKA (diabetic ketoacidoses) ([place]) 01/08/2016  . Hyponatremia 01/08/2016  . Dehydration 01/08/2016  . Urinary tract infection 01/08/2016  . Upper abdominal pain 01/08/2016  . Nausea & vomiting 01/08/2016    Past Surgical History:  Procedure Laterality Date  . ABDOMINAL HYSTERECTOMY      Prior to Admission medications   Medication Sig Start Date End Date Taking? Authorizing Provider  albuterol (PROVENTIL HFA;VENTOLIN HFA) 108 (90 Base) MCG/ACT inhaler Inhale 2 puffs into the  lungs every 6 (six) hours as needed for wheezing or shortness of breath. 02/18/18   Dustin Flock, MD  aspirin EC 81 MG tablet Take 81 mg by mouth daily.    [provider]  blood glucose meter kit and supplies Dispense based on patient and insurance preference. Use up to four times daily as directed. (FOR ICD-10 E10.9, E11.9). 05/26/19   Caren Griffins, MD  buPROPion (WELLBUTRIN SR) 150 MG 12 hr tablet Take 150 mg by mouth 2 (two) times daily.    [provider]  cetirizine (ZYRTEC) 10 MG tablet Take 10 mg by mouth daily. 05/11/19   [provider]  diltiazem (CARDIZEM CD) 120 MG 24 hr capsule Take 1 capsule (120 mg total) by mouth 2 (two) times a day. 01/06/19   Nicholes Mango, MD  folic acid (FOLVITE) 1 MG tablet Take 1 tablet (1 mg total) by mouth daily. 03/08/18   Nicholes Mango, MD  gabapentin (NEURONTIN) 300 MG capsule Take 300 mg by mouth at bedtime. 05/11/19   [provider]  glipiZIDE (GLUCOTROL XL) 5 MG 24 hr tablet Take 1 tablet (5 mg total) by mouth daily with breakfast. 01/06/19 01/07/20  Nicholes Mango, MD  Insulin Glargine (LANTUS) 100 UNIT/ML Solostar Pen Inject 30 Units into the skin daily. 01/06/19   Gouru, Illene Silver, MD  Insulin Pen Needle 32G X 5 MM MISC 30 Units by Does not apply route daily. 05/26/19   Caren Griffins, MD  ipratropium-albuterol (DUONEB) 0.5-2.5 (3) MG/3ML SOLN Take 3  mLs by nebulization every 6 (six) hours as needed for wheezing, shortness of breath or cough. 02/23/18   [provider]  lisinopril (PRINIVIL) 10 MG tablet Take 1 tablet (10 mg total) by mouth daily. 02/18/18   Dustin Flock, MD  lurasidone (LATUDA) 40 MG TABS tablet Take 1 tablet (40 mg total) by mouth every evening. 01/18/19   Bettey Costa, MD  metFORMIN (GLUCOPHAGE-XR) 500 MG 24 hr tablet Take 500 mg by mouth 2 (two) times daily. 05/11/19   [provider]  Multiple Vitamin (MULTIVITAMIN WITH MINERALS) TABS tablet Take 1 tablet by mouth daily. 03/08/18    Gouru, Illene Silver, MD  NOVOLOG FLEXPEN 100 UNIT/ML FlexPen Inject 8 Units into the skin 3 (three) times daily with meals. 05/26/19   Caren Griffins, MD  senna-docusate (SENOKOT-S) 8.6-50 MG tablet Take 1 tablet by mouth at bedtime as needed for mild constipation. 03/07/18   Nicholes Mango, MD  thiamine 100 MG tablet Take 1 tablet (100 mg total) by mouth daily. 03/08/18   Nicholes Mango, MD    No Known Allergies  Family History  Problem Relation Age of Onset  . Hypertension Mother     Social History Social History   Tobacco Use  . Smoking status: Current Every Day Smoker    Packs/day: 0.10    Types: Cigarettes  . Smokeless tobacco: Never Used  Substance Use Topics  . Alcohol use: Yes    Alcohol/week: 40.0 standard drinks    Types: 40 Cans of beer per week    Comment: 3 big bottles of liquor  . Drug use: Yes    Types: Marijuana, Cocaine    Review of Systems Constitutional: Negative for fever. Eyes: Negative for visual complaints ENT: Negative for recent illness/congestion Cardiovascular: Negative for chest pain. Respiratory: Negative for shortness of breath. Gastrointestinal: Negative for abdominal pain, vomiting and diarrhea. Genitourinary: Negative for urinary compaints Musculoskeletal: Negative for musculoskeletal complaints Skin: Negative for skin complaints  Neurological: Negative for headache All other ROS negative  ____________________________________________   PHYSICAL EXAM:  VITAL SIGNS: ED Triage Vitals  Enc Vitals Group     BP 06/14/19 1315 106/67     Pulse Rate 06/14/19 1315 100     Resp 06/14/19 1315 16     Temp 06/14/19 1315 98.6 F (37 C)     Temp Source 06/14/19 1315 Oral     SpO2 06/14/19 1315 100 %     Weight --      Height --      Head Circumference --      Peak Flow --      Pain Score 06/14/19 1317 10     Pain Loc --      Pain Edu? --      Excl. in [place]? --    Constitutional: Alert and oriented. Well appearing and in no distress. Eyes:  Normal exam ENT      Head: Normocephalic and atraumatic.      Mouth/Throat: Mucous membranes are moist. Cardiovascular: Normal rate, regular rhythm.  Respiratory: Normal respiratory effort without tachypnea nor retractions. Breath sounds are clear  Gastrointestinal: Soft and nontender. No distention.   Musculoskeletal: Nontender with normal range of motion in all extremities.  Neurologic:  Normal speech and language. No gross focal neurologic deficits  Skin:  Skin is warm, dry and intact.  Psychiatric: Mood and affect are normal.   ____________________________________________   INITIAL IMPRESSION / ASSESSMENT AND PLAN / ED COURSE  Pertinent labs & imaging results that were  available during my care of the patient were reviewed by me and considered in my medical decision making (see chart for details).   Patient presents to the emergency department for hyperglycemia.  States her blood sugar ran "high" at home today.  Per record review patient does have frequent episodes of DKA.  We will check labs, IV hydrate continue to closely monitor. Patient's lab work shows blood glucose in the 500s with an anion gap of 17 possibly indicating mild DKA we will obtain a VBG.  We will dose insulin, fluids and continue to closely monitor.  Overall the patient appears quite well.   Significant improvement in patient's lab work, repeat metabolic panel shows an anion gap of 7 with a glucose of 82.  We allow the patient to eat and drink in the emergency department and discharged home.  Patient agreeable to plan of care.  Pamela Lane was evaluated in Emergency Department on 06/14/2019 for the symptoms described in the history of present illness. She was evaluated in the context of the global COVID-19 pandemic, which necessitated consideration that the patient might be at risk for infection with the SARS-CoV-2 virus that causes COVID-19. Institutional protocols and algorithms that pertain to the evaluation of  patients at risk for COVID-19 are in a state of rapid change based on information released by regulatory bodies including the CDC and federal and state organizations. These policies and algorithms were followed during the patient's care in the ED.  ____________________________________________   FINAL CLINICAL IMPRESSION(S) / ED DIAGNOSES  Hyperglycemia   Harvest Dark, MD 06/14/19 2053

## 2019-06-14 NOTE — ED Triage Notes (Signed)
Pt via EMS from Pamela Lane with hyperglycemia of 500s. Pt denies any recent illness. NAD noted . B&TD1

## 2019-06-14 NOTE — ED Notes (Signed)
bg 324 

## 2019-06-14 NOTE — ED Triage Notes (Signed)
First Nurse Note:  Arrives EMS from [employer].  EMS called for hyperglycemia and Hypotension.  CBG:  598 and SBP 88.  300 ml NS given. Patient has history of DKA.  Last pressure 126/96 P:  88 SPo2 98%.  AAOx3.  Skin warm and dry.

## 2019-10-02 ENCOUNTER — Other Ambulatory Visit: Payer: Self-pay

## 2019-10-02 ENCOUNTER — Encounter: Payer: Self-pay | Admitting: Emergency Medicine

## 2019-10-02 ENCOUNTER — Inpatient Hospital Stay
Admission: EM | Admit: 2019-10-02 | Discharge: 2019-10-04 | DRG: 638 | Disposition: A | Discharge status: Home / Self Care | Payer: Medicaid Other | Attending: Internal Medicine | Admitting: Internal Medicine

## 2019-10-02 DIAGNOSIS — Z7982 Long term (current) use of aspirin: Secondary | ICD-10-CM

## 2019-10-02 DIAGNOSIS — J45909 Unspecified asthma, uncomplicated: Secondary | ICD-10-CM | POA: Diagnosis present

## 2019-10-02 DIAGNOSIS — E871 Hypo-osmolality and hyponatremia: Secondary | ICD-10-CM

## 2019-10-02 DIAGNOSIS — F3130 Bipolar disorder, current episode depressed, mild or moderate severity, unspecified: Secondary | ICD-10-CM | POA: Diagnosis not present

## 2019-10-02 DIAGNOSIS — Z9111 Patient's noncompliance with dietary regimen: Secondary | ICD-10-CM | POA: Diagnosis not present

## 2019-10-02 DIAGNOSIS — Z9114 Patient's other noncompliance with medication regimen: Secondary | ICD-10-CM | POA: Diagnosis not present

## 2019-10-02 DIAGNOSIS — Z794 Long term (current) use of insulin: Secondary | ICD-10-CM

## 2019-10-02 DIAGNOSIS — Z79899 Other long term (current) drug therapy: Secondary | ICD-10-CM | POA: Diagnosis not present

## 2019-10-02 DIAGNOSIS — F319 Bipolar disorder, unspecified: Secondary | ICD-10-CM | POA: Diagnosis present

## 2019-10-02 DIAGNOSIS — Z20822 Contact with and (suspected) exposure to covid-19: Secondary | ICD-10-CM | POA: Diagnosis present

## 2019-10-02 DIAGNOSIS — Z720 Tobacco use: Secondary | ICD-10-CM | POA: Diagnosis present

## 2019-10-02 DIAGNOSIS — R110 Nausea: Secondary | ICD-10-CM | POA: Diagnosis not present

## 2019-10-02 DIAGNOSIS — E1110 Type 2 diabetes mellitus with ketoacidosis without coma: Secondary | ICD-10-CM | POA: Diagnosis present

## 2019-10-02 DIAGNOSIS — F17210 Nicotine dependence, cigarettes, uncomplicated: Secondary | ICD-10-CM | POA: Diagnosis present

## 2019-10-02 DIAGNOSIS — Z8249 Family history of ischemic heart disease and other diseases of the circulatory system: Secondary | ICD-10-CM | POA: Diagnosis not present

## 2019-10-02 DIAGNOSIS — I10 Essential (primary) hypertension: Secondary | ICD-10-CM | POA: Diagnosis present

## 2019-10-02 LAB — CBC
HCT: 47.5 % — ABNORMAL HIGH (ref 36.0–46.0)
Hemoglobin: 16.5 g/dL — ABNORMAL HIGH (ref 12.0–15.0)
MCH: 31.4 pg (ref 26.0–34.0)
MCHC: 34.7 g/dL (ref 30.0–36.0)
MCV: 90.5 fL (ref 80.0–100.0)
Platelets: 341 10*3/uL (ref 150–400)
RBC: 5.25 MIL/uL — ABNORMAL HIGH (ref 3.87–5.11)
RDW: 12.4 % (ref 11.5–15.5)
WBC: 11.9 10*3/uL — ABNORMAL HIGH (ref 4.0–10.5)
nRBC: 0 % (ref 0.0–0.2)

## 2019-10-02 LAB — CBC WITH DIFFERENTIAL/PLATELET
Abs Immature Granulocytes: 0.04 10*3/uL (ref 0.00–0.07)
Basophils Absolute: 0 10*3/uL (ref 0.0–0.1)
Basophils Relative: 0 %
Eosinophils Absolute: 0.1 10*3/uL (ref 0.0–0.5)
Eosinophils Relative: 1 %
HCT: 46.8 % — ABNORMAL HIGH (ref 36.0–46.0)
Hemoglobin: 16.4 g/dL — ABNORMAL HIGH (ref 12.0–15.0)
Immature Granulocytes: 0 %
Lymphocytes Relative: 25 %
Lymphs Abs: 2.6 10*3/uL (ref 0.7–4.0)
MCH: 31.2 pg (ref 26.0–34.0)
MCHC: 35 g/dL (ref 30.0–36.0)
MCV: 89 fL (ref 80.0–100.0)
Monocytes Absolute: 0.6 10*3/uL (ref 0.1–1.0)
Monocytes Relative: 6 %
Neutro Abs: 6.9 10*3/uL (ref 1.7–7.7)
Neutrophils Relative %: 68 %
Platelets: 310 10*3/uL (ref 150–400)
RBC: 5.26 MIL/uL — ABNORMAL HIGH (ref 3.87–5.11)
RDW: 12.5 % (ref 11.5–15.5)
WBC: 10.3 10*3/uL (ref 4.0–10.5)
nRBC: 0 % (ref 0.0–0.2)

## 2019-10-02 LAB — SARS CORONAVIRUS 2 (TAT 6-24 HRS): SARS Coronavirus 2: NEGATIVE

## 2019-10-02 LAB — BLOOD GAS, VENOUS
Acid-base deficit: 16.8 mmol/L — ABNORMAL HIGH (ref 0.0–2.0)
Bicarbonate: 9.2 mmol/L — ABNORMAL LOW (ref 20.0–28.0)
O2 Saturation: 94.6 %
Patient temperature: 37
pCO2, Ven: 23 mmHg — ABNORMAL LOW (ref 44.0–60.0)
pH, Ven: 7.21 — ABNORMAL LOW (ref 7.250–7.430)
pO2, Ven: 89 mmHg — ABNORMAL HIGH (ref 32.0–45.0)

## 2019-10-02 LAB — BASIC METABOLIC PANEL
Anion gap: 17 — ABNORMAL HIGH (ref 5–15)
Anion gap: 14 (ref 5–15)
Anion gap: 16 — ABNORMAL HIGH (ref 5–15)
BUN: 18 mg/dL (ref 6–20)
BUN: 15 mg/dL (ref 6–20)
BUN: 14 mg/dL (ref 6–20)
CO2: 13 mmol/L — ABNORMAL LOW (ref 22–32)
CO2: 11 mmol/L — ABNORMAL LOW (ref 22–32)
CO2: 13 mmol/L — ABNORMAL LOW (ref 22–32)
Calcium: 9.6 mg/dL (ref 8.9–10.3)
Calcium: 9.3 mg/dL (ref 8.9–10.3)
Calcium: 8.8 mg/dL — ABNORMAL LOW (ref 8.9–10.3)
Chloride: 101 mmol/L (ref 98–111)
Chloride: 107 mmol/L (ref 98–111)
Chloride: 103 mmol/L (ref 98–111)
Creatinine, Ser: 1.12 mg/dL — ABNORMAL HIGH (ref 0.44–1.00)
Creatinine, Ser: 0.73 mg/dL (ref 0.44–1.00)
Creatinine, Ser: 0.87 mg/dL (ref 0.44–1.00)
GFR calc Af Amer: >60 mL/min (ref >60)
GFR calc Af Amer: >60 mL/min (ref >60)
GFR calc Af Amer: >60 mL/min (ref >60)
GFR calc non Af Amer: 54 mL/min — ABNORMAL LOW (ref >60)
GFR calc non Af Amer: >60 mL/min (ref >60)
GFR calc non Af Amer: >60 mL/min (ref >60)
Glucose, Bld: 441 mg/dL — ABNORMAL HIGH (ref 70–99)
Glucose, Bld: 160 mg/dL — ABNORMAL HIGH (ref 70–99)
Glucose, Bld: 209 mg/dL — ABNORMAL HIGH (ref 70–99)
Potassium: 4.8 mmol/L (ref 3.5–5.1)
Potassium: 4 mmol/L (ref 3.5–5.1)
Potassium: 4.1 mmol/L (ref 3.5–5.1)
Sodium: 131 mmol/L — ABNORMAL LOW (ref 135–145)
Sodium: 132 mmol/L — ABNORMAL LOW (ref 135–145)
Sodium: 132 mmol/L — ABNORMAL LOW (ref 135–145)

## 2019-10-02 LAB — GLUCOSE, CAPILLARY
Glucose-Capillary: 124 mg/dL — ABNORMAL HIGH (ref 70–99)
Glucose-Capillary: 136 mg/dL — ABNORMAL HIGH (ref 70–99)
Glucose-Capillary: 146 mg/dL — ABNORMAL HIGH (ref 70–99)
Glucose-Capillary: 167 mg/dL — ABNORMAL HIGH (ref 70–99)
Glucose-Capillary: 206 mg/dL — ABNORMAL HIGH (ref 70–99)
Glucose-Capillary: 265 mg/dL — ABNORMAL HIGH (ref 70–99)
Glucose-Capillary: 372 mg/dL — ABNORMAL HIGH (ref 70–99)
Glucose-Capillary: 419 mg/dL — ABNORMAL HIGH (ref 70–99)

## 2019-10-02 LAB — TROPONIN I (HIGH SENSITIVITY)
Troponin I (High Sensitivity): <2 ng/L (ref <18)
Troponin I (High Sensitivity): <2 ng/L (ref <18)

## 2019-10-02 LAB — BETA-HYDROXYBUTYRIC ACID
Beta-Hydroxybutyric Acid: 3.94 mmol/L — ABNORMAL HIGH (ref 0.05–0.27)
Beta-Hydroxybutyric Acid: 4.78 mmol/L — ABNORMAL HIGH (ref 0.05–0.27)

## 2019-10-02 MED ORDER — GABAPENTIN 300 MG PO CAPS
300.0000 mg | ORAL_CAPSULE | Freq: Every day | ORAL | Status: DC
  Administered 2019-10-03 (×2): 300 mg via ORAL
  Filled 2019-10-02 (×2): qty 1

## 2019-10-02 MED ORDER — BUPROPION HCL ER (SR) 150 MG PO TB12
150.0000 mg | ORAL_TABLET | Freq: Two times a day (BID) | ORAL | Status: DC
  Administered 2019-10-03 – 2019-10-04 (×4): 150 mg via ORAL
  Filled 2019-10-02 (×6): qty 1

## 2019-10-02 MED ORDER — LORATADINE 10 MG PO TABS
10.0000 mg | ORAL_TABLET | Freq: Every day | ORAL | Status: DC
  Administered 2019-10-03 – 2019-10-04 (×2): 10 mg via ORAL
  Filled 2019-10-02 (×2): qty 1

## 2019-10-02 MED ORDER — INSULIN ASPART 100 UNIT/ML ~~LOC~~ SOLN: 0.0000 [IU] | Freq: Every day | SUBCUTANEOUS | Status: DC

## 2019-10-02 MED ORDER — ACETAMINOPHEN 650 MG RE SUPP: 650.0000 mg | Freq: Four times a day (QID) | RECTAL | Status: DC | PRN

## 2019-10-02 MED ORDER — SODIUM CHLORIDE 0.9 % IV SOLN: INTRAVENOUS | Status: DC

## 2019-10-02 MED ORDER — LURASIDONE HCL 40 MG PO TABS: 40.0000 mg | ORAL_TABLET | Freq: Every evening | ORAL | Status: DC

## 2019-10-02 MED ORDER — LISINOPRIL 5 MG PO TABS
10.0000 mg | ORAL_TABLET | Freq: Every day | ORAL | Status: DC
  Administered 2019-10-03 – 2019-10-04 (×2): 10 mg via ORAL
  Filled 2019-10-02: qty 2
  Filled 2019-10-02: qty 1

## 2019-10-02 MED ORDER — LURASIDONE HCL 40 MG PO TABS
40.0000 mg | ORAL_TABLET | Freq: Every evening | ORAL | Status: DC
  Administered 2019-10-03: 40 mg via ORAL
  Filled 2019-10-02 (×3): qty 1

## 2019-10-02 MED ORDER — LACTATED RINGERS IV SOLN: INTRAVENOUS | Status: DC

## 2019-10-02 MED ORDER — ENOXAPARIN SODIUM 40 MG/0.4ML ~~LOC~~ SOLN
40.0000 mg | SUBCUTANEOUS | Status: DC
  Administered 2019-10-03 (×2): 40 mg via SUBCUTANEOUS
  Filled 2019-10-02 (×2): qty 0.4

## 2019-10-02 MED ORDER — ADULT MULTIVITAMIN W/MINERALS CH
1.0000 | ORAL_TABLET | Freq: Every day | ORAL | Status: DC
  Administered 2019-10-03 – 2019-10-04 (×2): 1 via ORAL
  Filled 2019-10-02 (×2): qty 1

## 2019-10-02 MED ORDER — POTASSIUM CHLORIDE 10 MEQ/100ML IV SOLN
10.0000 meq | INTRAVENOUS | Status: AC
  Administered 2019-10-02: 20:00:00 10 meq via INTRAVENOUS
  Filled 2019-10-02: qty 100

## 2019-10-02 MED ORDER — INSULIN ASPART 100 UNIT/ML ~~LOC~~ SOLN
0.0000 [IU] | Freq: Three times a day (TID) | SUBCUTANEOUS | Status: DC
  Administered 2019-10-03: 7 [IU] via SUBCUTANEOUS
  Administered 2019-10-03: 9 [IU] via SUBCUTANEOUS
  Filled 2019-10-02 (×2): qty 1

## 2019-10-02 MED ORDER — POTASSIUM CHLORIDE 10 MEQ/100ML IV SOLN
10.0000 meq | INTRAVENOUS | Status: DC
  Filled 2019-10-02 (×2): qty 100

## 2019-10-02 MED ORDER — SENNOSIDES-DOCUSATE SODIUM 8.6-50 MG PO TABS
1.0000 | ORAL_TABLET | Freq: Every evening | ORAL | Status: DC | PRN
  Filled 2019-10-02: qty 1

## 2019-10-02 MED ORDER — IPRATROPIUM-ALBUTEROL 0.5-2.5 (3) MG/3ML IN SOLN: 3.0000 mL | Freq: Four times a day (QID) | RESPIRATORY_TRACT | Status: DC | PRN

## 2019-10-02 MED ORDER — DEXTROSE 50 % IV SOLN: 0.0000 mL | INTRAVENOUS | Status: DC | PRN

## 2019-10-02 MED ORDER — FOLIC ACID 1 MG PO TABS
1.0000 mg | ORAL_TABLET | Freq: Every day | ORAL | Status: DC
  Administered 2019-10-03 – 2019-10-04 (×2): 1 mg via ORAL
  Filled 2019-10-02 (×2): qty 1

## 2019-10-02 MED ORDER — DEXTROSE IN LACTATED RINGERS 5 % IV SOLN: INTRAVENOUS | Status: DC

## 2019-10-02 MED ORDER — DILTIAZEM HCL ER COATED BEADS 120 MG PO CP24
120.0000 mg | ORAL_CAPSULE | Freq: Every day | ORAL | Status: DC
  Administered 2019-10-02 – 2019-10-04 (×3): 120 mg via ORAL
  Filled 2019-10-02 (×3): qty 1

## 2019-10-02 MED ORDER — INSULIN REGULAR(HUMAN) IN NACL 100-0.9 UT/100ML-% IV SOLN: INTRAVENOUS | Status: DC

## 2019-10-02 MED ORDER — ONDANSETRON HCL 4 MG PO TABS: 4.0000 mg | ORAL_TABLET | Freq: Four times a day (QID) | ORAL | Status: DC | PRN

## 2019-10-02 MED ORDER — ACETAMINOPHEN 325 MG PO TABS: 650.0000 mg | ORAL_TABLET | Freq: Four times a day (QID) | ORAL | Status: DC | PRN

## 2019-10-02 MED ORDER — DEXTROSE-NACL 5-0.45 % IV SOLN: INTRAVENOUS | Status: DC

## 2019-10-02 MED ORDER — THIAMINE HCL 100 MG PO TABS
100.0000 mg | ORAL_TABLET | Freq: Every day | ORAL | Status: DC
  Administered 2019-10-03 – 2019-10-04 (×2): 100 mg via ORAL
  Filled 2019-10-02 (×2): qty 1

## 2019-10-02 MED ORDER — INSULIN DETEMIR 100 UNIT/ML ~~LOC~~ SOLN
0.3000 [IU]/kg | SUBCUTANEOUS | Status: DC
  Administered 2019-10-02: 25 [IU] via SUBCUTANEOUS
  Filled 2019-10-02 (×2): qty 0.25

## 2019-10-02 MED ORDER — INSULIN ASPART 100 UNIT/ML ~~LOC~~ SOLN
3.0000 [IU] | Freq: Three times a day (TID) | SUBCUTANEOUS | Status: DC
  Administered 2019-10-03: 3 [IU] via SUBCUTANEOUS
  Filled 2019-10-02: qty 1

## 2019-10-02 MED ORDER — SODIUM CHLORIDE 0.9 % IV BOLUS
1000.0000 mL | Freq: Once | INTRAVENOUS | Status: AC
  Administered 2019-10-02: 1000 mL via INTRAVENOUS

## 2019-10-02 MED ORDER — POTASSIUM CHLORIDE 10 MEQ/100ML IV SOLN
10.0000 meq | INTRAVENOUS | Status: AC
  Administered 2019-10-02: 10 meq via INTRAVENOUS
  Filled 2019-10-02 (×2): qty 100

## 2019-10-02 MED ORDER — ONDANSETRON HCL 4 MG/2ML IJ SOLN: 4.0000 mg | Freq: Four times a day (QID) | INTRAMUSCULAR | Status: DC | PRN

## 2019-10-02 MED ORDER — ASPIRIN EC 81 MG PO TBEC
81.0000 mg | DELAYED_RELEASE_TABLET | Freq: Every day | ORAL | Status: DC
  Administered 2019-10-02 – 2019-10-04 (×3): 81 mg via ORAL
  Filled 2019-10-02 (×3): qty 1

## 2019-10-02 MED ORDER — INSULIN REGULAR(HUMAN) IN NACL 100-0.9 UT/100ML-% IV SOLN
INTRAVENOUS | Status: DC
  Administered 2019-10-02: 17:00:00 9.5 [IU]/h via INTRAVENOUS
  Administered 2019-10-02: 2 [IU]/h via INTRAVENOUS
  Administered 2019-10-02: 0.2 [IU]/h via INTRAVENOUS
  Administered 2019-10-02: 16:00:00 14 [IU]/h via INTRAVENOUS
  Administered 2019-10-02: 6 [IU]/h via INTRAVENOUS
  Filled 2019-10-02: qty 100

## 2019-10-02 NOTE — ED Notes (Signed)
Patient is resting on stretcher. Room is darkened per patient's request. B/P cuff is off per request at this time. Waiting for lab to draw labs and IV team for 2nd IV.

## 2019-10-02 NOTE — ED Triage Notes (Signed)
Pt in via EMS from home with c/o elevated blood sugar and shortness of breath. 100%RA. FSBS Reports to EMS that if she is not take to a room she will snap.   140/50, HR 115-140  Hx of bipolar

## 2019-10-02 NOTE — ED Notes (Signed)
Night-time coverage contacted via text about patient's issues. Text received that the provider will look over chart.

## 2019-10-02 NOTE — ED Provider Notes (Signed)
[facility] Emergency Department Provider Note   ____________________________________________    I have reviewed the triage vital signs and the nursing notes.   HISTORY  Chief Complaint Dizziness, nausea, chest discomfort    HPI Pamela Lane is a 59 y.o. female with a history of diabetes who presents with complaints of dizziness and near syncopal episode that occurred yesterday.  Since then she has not been feeling good.  She felt some chest discomfort today.  Positive nausea no vomiting.  Has not take anything for this.  No chest pain currently.  No shortness of breath.  No cough or fevers or chills.  Review of medical records demonstrates admissions for DKA in the past  Past Medical History:  Diagnosis Date  . Asthma   . Clostridium difficile colitis   . Diabetes mellitus without complication ([place])   . Hypertension   . Mental disorder    pt reports 'I have all of them mental disorders'    Patient Active Problem List   Diagnosis Date Noted  . Bipolar disorder ([place]) 01/17/2019  . Acute pancreatitis 05/15/2016  . AKI (acute kidney injury) ([place])   . Diabetic ketoacidosis without coma associated with type 2 diabetes mellitus ([place])   . Hyperglycemia due to type 2 diabetes mellitus ([place]) 01/13/2016  . UTI (urinary tract infection) 01/13/2016  . DKA (diabetic ketoacidoses) ([place]) 01/08/2016  . Hyponatremia 01/08/2016  . Dehydration 01/08/2016  . Urinary tract infection 01/08/2016  . Upper abdominal pain 01/08/2016  . Nausea & vomiting 01/08/2016    Past Surgical History:  Procedure Laterality Date  . ABDOMINAL HYSTERECTOMY      Prior to Admission medications   Medication Sig Start Date End Date Taking? Authorizing Provider  albuterol (PROVENTIL HFA;VENTOLIN HFA) 108 (90 Base) MCG/ACT inhaler Inhale 2 puffs into the lungs every 6 (six) hours as needed for wheezing or shortness of breath. 02/18/18   Dustin Flock, MD  aspirin EC 81 MG  tablet Take 81 mg by mouth daily.    [provider]  blood glucose meter kit and supplies Dispense based on patient and insurance preference. Use up to four times daily as directed. (FOR ICD-10 E10.9, E11.9). 05/26/19   Caren Griffins, MD  buPROPion (WELLBUTRIN SR) 150 MG 12 hr tablet Take 150 mg by mouth 2 (two) times daily.    [provider]  cetirizine (ZYRTEC) 10 MG tablet Take 10 mg by mouth daily. 05/11/19   [provider]  diltiazem (CARDIZEM CD) 120 MG 24 hr capsule Take 1 capsule (120 mg total) by mouth 2 (two) times a day. 01/06/19   Nicholes Mango, MD  folic acid (FOLVITE) 1 MG tablet Take 1 tablet (1 mg total) by mouth daily. 03/08/18   Nicholes Mango, MD  gabapentin (NEURONTIN) 300 MG capsule Take 300 mg by mouth at bedtime. 05/11/19   [provider]  glipiZIDE (GLUCOTROL XL) 5 MG 24 hr tablet Take 1 tablet (5 mg total) by mouth daily with breakfast. 01/06/19 01/07/20  Nicholes Mango, MD  Insulin Glargine (LANTUS) 100 UNIT/ML Solostar Pen Inject 30 Units into the skin daily. 01/06/19   Gouru, Illene Silver, MD  Insulin Pen Needle 32G X 5 MM MISC 30 Units by Does not apply route daily. 05/26/19   Caren Griffins, MD  ipratropium-albuterol (DUONEB) 0.5-2.5 (3) MG/3ML SOLN Take 3 mLs by nebulization every 6 (six) hours as needed for wheezing, shortness of breath or cough. 02/23/18   [provider]  lisinopril (  PRINIVIL) 10 MG tablet Take 1 tablet (10 mg total) by mouth daily. 02/18/18   Dustin Flock, MD  lurasidone (LATUDA) 40 MG TABS tablet Take 1 tablet (40 mg total) by mouth every evening. 01/18/19   Bettey Costa, MD  metFORMIN (GLUCOPHAGE-XR) 500 MG 24 hr tablet Take 500 mg by mouth 2 (two) times daily. 05/11/19   [provider]  Multiple Vitamin (MULTIVITAMIN WITH MINERALS) TABS tablet Take 1 tablet by mouth daily. 03/08/18   Gouru, Illene Silver, MD  NOVOLOG FLEXPEN 100 UNIT/ML FlexPen Inject 8 Units into the skin 3 (three) times daily with meals.  05/26/19   Caren Griffins, MD  senna-docusate (SENOKOT-S) 8.6-50 MG tablet Take 1 tablet by mouth at bedtime as needed for mild constipation. 03/07/18   Nicholes Mango, MD  thiamine 100 MG tablet Take 1 tablet (100 mg total) by mouth daily. 03/08/18   Nicholes Mango, MD     Allergies Patient has no known allergies.  Family History  Problem Relation Age of Onset  . Hypertension Mother     Social History Social History   Tobacco Use  . Smoking status: Current Every Day Smoker    Packs/day: 0.25    Types: Cigarettes  . Smokeless tobacco: Never Used  Substance Use Topics  . Alcohol use: Yes    Alcohol/week: 40.0 standard drinks    Types: 40 Cans of beer per week    Comment: 3 big bottles of liquor  . Drug use: Yes    Types: Marijuana, Cocaine    Review of Systems  Constitutional: No fever/chills Eyes: No visual changes.  ENT: No sore throat. Cardiovascular: As above Respiratory: Denies shortness of breath. Gastrointestinal: As above Genitourinary: Negative for dysuria. Musculoskeletal: Negative for back pain. Skin: Negative for rash. Neurological: Negative for headaches    ____________________________________________   PHYSICAL EXAM:  VITAL SIGNS: ED Triage Vitals  Enc Vitals Group     BP 10/02/19 1101 135/82     Pulse Rate 10/02/19 1101 (!) 127     Resp 10/02/19 1101 20     Temp 10/02/19 1101 98.6 F (37 C)     Temp Source 10/02/19 1101 Oral     SpO2 10/02/19 1101 100 %     Weight 10/02/19 1103 84.4 kg (186 lb)     Height 10/02/19 1103 1.6 m ('5\' 3"' )     Head Circumference --      Peak Flow --      Pain Score 10/02/19 1102 8     Pain Loc --      Pain Edu? --      Excl. in [place]? --     Constitutional: Alert and oriented.  Eyes: Conjunctivae are normal.  Head: Atraumatic. Nose: No congestion/rhinnorhea. Mouth/Throat: Mucous membranes are dry Neck:  Painless ROM Cardiovascular: Tachycardia, regular rhythm. Grossly normal heart sounds.  Good peripheral  circulation. Respiratory: Normal respiratory effort.  No retractions. Lungs CTAB. Gastrointestinal: Soft and nontender. No distention.    Musculoskeletal:   Warm and well perfused Neurologic:  Normal speech and language. No gross focal neurologic deficits are appreciated.  Skin:  Skin is warm, dry and intact. No rash noted. Psychiatric: Mood and affect are normal. Speech and behavior are normal.  ____________________________________________   LABS (all labs ordered are listed, but only abnormal results are displayed)  Labs Reviewed  GLUCOSE, CAPILLARY - Abnormal; Notable for the following components:      Result Value   Glucose-Capillary 419 (*)    All other components  within normal limits  BASIC METABOLIC PANEL - Abnormal; Notable for the following components:   Sodium 131 (*)    CO2 13 (*)    Glucose, Bld 441 (*)    Creatinine, Ser 1.12 (*)    GFR calc non Af Amer 54 (*)    Anion gap 17 (*)    All other components within normal limits  CBC - Abnormal; Notable for the following components:   WBC 11.9 (*)    RBC 5.25 (*)    Hemoglobin 16.5 (*)    HCT 47.5 (*)    All other components within normal limits  BLOOD GAS, VENOUS - Abnormal; Notable for the following components:   pH, Ven 7.21 (*)    pCO2, Ven 23 (*)    pO2, Ven 89.0 (*)    Bicarbonate 9.2 (*)    Acid-base deficit 16.8 (*)    All other components within normal limits  GLUCOSE, CAPILLARY - Abnormal; Notable for the following components:   Glucose-Capillary 372 (*)    All other components within normal limits  SARS CORONAVIRUS 2 (TAT 6-24 HRS)  URINALYSIS, COMPLETE (UACMP) WITH MICROSCOPIC  BASIC METABOLIC PANEL  BASIC METABOLIC PANEL  BASIC METABOLIC PANEL  BASIC METABOLIC PANEL  BETA-HYDROXYBUTYRIC ACID  BETA-HYDROXYBUTYRIC ACID  CBC WITH DIFFERENTIAL/PLATELET  URINALYSIS, ROUTINE W REFLEX MICROSCOPIC  CBG MONITORING, ED  CBG MONITORING, ED  POC URINE PREG, ED  CBG MONITORING, ED  TROPONIN I (HIGH  SENSITIVITY)  TROPONIN I (HIGH SENSITIVITY)   ____________________________________________  EKG  ED ECG REPORT I, Lavonia Drafts, the attending physician, personally viewed and interpreted this ECG.  Date: 10/02/2019  Rhythm: normal sinus rhythm QRS Axis: normal Intervals: normal ST/T Wave abnormalities: normal Narrative Interpretation: no evidence of acute ischemia  ____________________________________________  RADIOLOGY  None ____________________________________________   PROCEDURES  Procedure(s) performed: No  Procedures   Critical Care performed: yes  CRITICAL CARE Performed by: Lavonia Drafts   Total critical care time: 35 minutes  Critical care time was exclusive of separately billable procedures and treating other patients.  Critical care was necessary to treat or prevent imminent or life-threatening deterioration.  Critical care was time spent personally by me on the following activities: development of treatment plan with patient and/or surrogate as well as nursing, discussions with consultants, evaluation of patient's response to treatment, examination of patient, obtaining history from patient or surrogate, ordering and performing treatments and interventions, ordering and review of laboratory studies, ordering and review of radiographic studies, pulse oximetry and re-evaluation of patient's condition.  ____________________________________________   INITIAL IMPRESSION / ASSESSMENT AND PLAN / ED COURSE  Pertinent labs & imaging results that were available during my care of the patient were reviewed by me and considered in my medical decision making (see chart for details).  Patient presents with nausea, chest discomfort, near syncopal episode.  EKG is overall reassuring, she is chest pain-free here in the emergency department.  Doubt ACS as the cause of her symptoms given reassuring EKG.  Review of medical records demonstrates a history of DKA      Lab work significant for elevated glucose with elevated anion gap.  pH is consistent with DKA.  IV fluids infusing.  Will start insulin drip.  Have discussed with the hospitalist for admission    ____________________________________________   FINAL CLINICAL IMPRESSION(S) / ED DIAGNOSES  Final diagnoses:  Diabetic ketoacidosis without coma associated with type 2 diabetes mellitus ([place])        Note:  This document was prepared  using Systems analyst and may include unintentional dictation errors.   Lavonia Drafts, MD 10/02/19 1540

## 2019-10-02 NOTE — ED Notes (Signed)
Report given to MAC RN  

## 2019-10-02 NOTE — H&P (Signed)
History and Physical    Pamela Lane LKG:[id number] DOB: Aug 05, 1960 DOA: 10/02/2019  PCP: [facility] [place]   Patient coming from: Home  I have personally briefly reviewed patient's old medical records in [place]  Chief Complaint: Nausea, chest discomfort   HPI: Pamela Lane is a 59 y.o. female with medical history significant for diabetes mellitus presents to the emergency room for evaluation of dizziness and near syncopal episode. She complains of some chest discomfort but denies having any nausea or vomiting. She has no shortness of breath, fever or chills. She states that her sugars are erratic and that she has difficulty controlling her blood sugars at home. She  complains of pain in her right hip but denies any fall. She has no abdominal pain, no urinary frequency, nocturia or dysuria.  Has no fever or chills.  ED Course: Patient presents with nausea, chest discomfort, near syncopal episode.  EKG is overall reassuring, she is chest pain-free here in the emergency department.  Doubt ACS as the cause of her symptoms given reassuring EKG.  Review of medical records demonstrates a history of DKA Lab work significant for elevated glucose with elevated anion gap.  pH is consistent with DKA.  IV fluids infusing.  Will start insulin drip.  Have discussed with the hospitalist for admission   Review of Systems: As per HPI otherwise 10 point review of systems negative.    Past Medical History:  Diagnosis Date  . Asthma   . Clostridium difficile colitis   . Diabetes mellitus without complication ([place])   . Hypertension   . Mental disorder    pt reports 'I have all of them mental disorders'    Past Surgical History:  Procedure Laterality Date  . ABDOMINAL HYSTERECTOMY       reports that she has been smoking cigarettes. She has been smoking about 0.25 packs per day. She has never used smokeless tobacco. She reports current alcohol use of about 40.0  standard drinks of alcohol per week. She reports current drug use. Drugs: Marijuana and Cocaine.  No Known Allergies  Family History  Problem Relation Age of Onset  . Hypertension Mother      Prior to Admission medications   Medication Sig Start Date End Date Taking? Authorizing Provider  albuterol (PROVENTIL HFA;VENTOLIN HFA) 108 (90 Base) MCG/ACT inhaler Inhale 2 puffs into the lungs every 6 (six) hours as needed for wheezing or shortness of breath. 02/18/18   Dustin Flock, MD  aspirin EC 81 MG tablet Take 81 mg by mouth daily.    [provider]  blood glucose meter kit and supplies Dispense based on patient and insurance preference. Use up to four times daily as directed. (FOR ICD-10 E10.9, E11.9). 05/26/19   Caren Griffins, MD  buPROPion (WELLBUTRIN SR) 150 MG 12 hr tablet Take 150 mg by mouth 2 (two) times daily.    [provider]  cetirizine (ZYRTEC) 10 MG tablet Take 10 mg by mouth daily. 05/11/19   [provider]  diltiazem (CARDIZEM CD) 120 MG 24 hr capsule Take 1 capsule (120 mg total) by mouth 2 (two) times a day. 01/06/19   Nicholes Mango, MD  folic acid (FOLVITE) 1 MG tablet Take 1 tablet (1 mg total) by mouth daily. 03/08/18   Nicholes Mango, MD  gabapentin (NEURONTIN) 300 MG capsule Take 300 mg by mouth at bedtime. 05/11/19   [provider]  glipiZIDE (GLUCOTROL XL) 5 MG 24 hr tablet Take  1 tablet (5 mg total) by mouth daily with breakfast. 01/06/19 01/07/20  Nicholes Mango, MD  Insulin Glargine (LANTUS) 100 UNIT/ML Solostar Pen Inject 30 Units into the skin daily. 01/06/19   Gouru, Illene Silver, MD  Insulin Pen Needle 32G X 5 MM MISC 30 Units by Does not apply route daily. 05/26/19   Caren Griffins, MD  ipratropium-albuterol (DUONEB) 0.5-2.5 (3) MG/3ML SOLN Take 3 mLs by nebulization every 6 (six) hours as needed for wheezing, shortness of breath or cough. 02/23/18   [provider]  lisinopril (PRINIVIL) 10 MG tablet Take 1 tablet (10 mg  total) by mouth daily. 02/18/18   Dustin Flock, MD  lurasidone (LATUDA) 40 MG TABS tablet Take 1 tablet (40 mg total) by mouth every evening. 01/18/19   Bettey Costa, MD  metFORMIN (GLUCOPHAGE-XR) 500 MG 24 hr tablet Take 500 mg by mouth 2 (two) times daily. 05/11/19   [provider]  Multiple Vitamin (MULTIVITAMIN WITH MINERALS) TABS tablet Take 1 tablet by mouth daily. 03/08/18   Gouru, Illene Silver, MD  NOVOLOG FLEXPEN 100 UNIT/ML FlexPen Inject 8 Units into the skin 3 (three) times daily with meals. 05/26/19   Caren Griffins, MD  senna-docusate (SENOKOT-S) 8.6-50 MG tablet Take 1 tablet by mouth at bedtime as needed for mild constipation. 03/07/18   Nicholes Mango, MD  thiamine 100 MG tablet Take 1 tablet (100 mg total) by mouth daily. 03/08/18   Nicholes Mango, MD    Physical Exam: Vitals:   10/02/19 1515 10/02/19 1530 10/02/19 1545 10/02/19 1615  BP: 120/66     Pulse: (!) 108 (!) 110 (!) 102 (!) 103  Resp: 18 (!) 37 (!) 23 19  Temp:      TempSrc:      SpO2: 100% 100% 99% 99%  Weight:      Height:         Vitals:   10/02/19 1515 10/02/19 1530 10/02/19 1545 10/02/19 1615  BP: 120/66     Pulse: (!) 108 (!) 110 (!) 102 (!) 103  Resp: 18 (!) 37 (!) 23 19  Temp:      TempSrc:      SpO2: 100% 100% 99% 99%  Weight:      Height:        Constitutional: NAD, alert and oriented x 3 Eyes: PERRL, lids and conjunctivae normal ENMT: Mucous membranes are dry Neck: normal, supple, no masses, no thyromegaly Respiratory: clear to auscultation bilaterally, no wheezing, no crackles. Normal respiratory effort. No accessory muscle use.  Cardiovascular: Tachycardic, no murmurs / rubs / gallops. No extremity edema. 2+ pedal pulses. No carotid bruits.  Abdomen: no tenderness, no masses palpated. No hepatosplenomegaly. Bowel sounds positive.  Musculoskeletal: no clubbing / cyanosis. No joint deformity upper and lower extremities.  Skin: no rashes, lesions, ulcers.  Neurologic: No gross focal  neurologic deficit. Psychiatric: Normal mood and affect.   Labs on Admission: I have personally reviewed following labs and imaging studies  CBC: Recent Labs  Lab 10/02/19 1116  WBC 11.9*  HGB 16.5*  HCT 47.5*  MCV 90.5  PLT 314   Basic Metabolic Panel: Recent Labs  Lab 10/02/19 1116  NA 131*  K 4.8  CL 101  CO2 13*  GLUCOSE 441*  BUN 18  CREATININE 1.12*  CALCIUM 9.6   GFR: Estimated Creatinine Clearance: 56.4 mL/min (A) (by C-G formula based on SCr of 1.12 mg/dL (H)). Liver Function Tests: No results for input(s): AST, ALT, ALKPHOS, BILITOT, PROT, ALBUMIN in the  last 168 hours. No results for input(s): LIPASE, AMYLASE in the last 168 hours. No results for input(s): AMMONIA in the last 168 hours. Coagulation Profile: No results for input(s): INR, PROTIME in the last 168 hours. Cardiac Enzymes: No results for input(s): CKTOTAL, CKMB, CKMBINDEX, TROPONINI in the last 168 hours. BNP (last 3 results) No results for input(s): PROBNP in the last 8760 hours. HbA1C: No results for input(s): HGBA1C in the last 72 hours. CBG: Recent Labs  Lab 10/02/19 1104 10/02/19 1521  GLUCAP 419* 372*   Lipid Profile: No results for input(s): CHOL, HDL, LDLCALC, TRIG, CHOLHDL, LDLDIRECT in the last 72 hours. Thyroid Function Tests: No results for input(s): TSH, T4TOTAL, FREET4, T3FREE, THYROIDAB in the last 72 hours. Anemia Panel: No results for input(s): VITAMINB12, FOLATE, FERRITIN, TIBC, IRON, RETICCTPCT in the last 72 hours. Urine analysis:    Component Value Date/Time   COLORURINE YELLOW (A) 06/14/2019 1816   APPEARANCEUR HAZY (A) 06/14/2019 1816   LABSPEC 1.022 06/14/2019 1816   PHURINE 5.0 06/14/2019 1816   GLUCOSEU >=500 (A) 06/14/2019 1816   HGBUR NEGATIVE 06/14/2019 1816   BILIRUBINUR NEGATIVE 06/14/2019 1816   KETONESUR 20 (A) 06/14/2019 1816   PROTEINUR NEGATIVE 06/14/2019 1816   NITRITE NEGATIVE 06/14/2019 1816   LEUKOCYTESUR TRACE (A) 06/14/2019 1816     Radiological Exams on Admission: No results found.  EKG: Independently reviewed.  Sinus tachycardia  Assessment/Plan Principal Problem:   DKA, type 2 (HCC) Active Problems:   Hyponatremia   Bipolar disorder ([place])   Essential hypertension     DKA Most likely secondary to medication noncompliance Will place patient on regular insulin infusion Aggressive IV fluid hydration Check and supplement electrolytes Diabetic education and nutrition evaluation   Bipolar disorder Continue bupropion and Latuda   Hypertension Continue lisinopril and Cardizem   Hyponatremia Secondary to hyperglycemia Expect improvement in serum sodium levels following resolution of DKA  DVT prophylaxis: Lovenox Code Status: Full code Family Communication: Plan of care was discussed with patient.  She verbalizes understanding and agrees with the plan Disposition Plan: Back to previous home environment Consults called: Dietician, Diabetic Educator    Collier Bullock MD [facility]     10/02/2019, 4:35 PM

## 2019-10-02 NOTE — ED Notes (Signed)
Hospitalist contacted via text about patient's blood sugar and insulin drip. Patient is agitated and wants something to eat.

## 2019-10-02 NOTE — ED Notes (Signed)
Patient refused Tylenol for headache until she gets some food. Patient is agitated and swearing.

## 2019-10-02 NOTE — ED Notes (Addendum)
Lab called to do lab draw. Two unsuccessful IV attempts by another RN for a 2nd line and blood draw.

## 2019-10-02 NOTE — ED Triage Notes (Addendum)
Pt in via [facility], reports hyperglycemia, chest pain, right hip pain.  Emesis noted in triage.  Pt tachycardic, other vitals WDL.

## 2019-10-02 NOTE — ED Notes (Signed)
Patient refused 1900 blood draw and 1942 blood draw due to feeling hungry and frustrated. Provider informed.

## 2019-10-02 NOTE — ED Notes (Signed)
Insulin rate change verified by Janine Limbo RN

## 2019-10-03 DIAGNOSIS — F3130 Bipolar disorder, current episode depressed, mild or moderate severity, unspecified: Secondary | ICD-10-CM

## 2019-10-03 DIAGNOSIS — E1110 Type 2 diabetes mellitus with ketoacidosis without coma: Principal | ICD-10-CM

## 2019-10-03 DIAGNOSIS — I10 Essential (primary) hypertension: Secondary | ICD-10-CM

## 2019-10-03 LAB — CBC
HCT: 41.6 % (ref 36.0–46.0)
Hemoglobin: 14 g/dL (ref 12.0–15.0)
MCH: 30.2 pg (ref 26.0–34.0)
MCHC: 33.7 g/dL (ref 30.0–36.0)
MCV: 89.8 fL (ref 80.0–100.0)
Platelets: 261 10*3/uL (ref 150–400)
RBC: 4.63 MIL/uL (ref 3.87–5.11)
RDW: 12.4 % (ref 11.5–15.5)
WBC: 7.7 10*3/uL (ref 4.0–10.5)
nRBC: 0 % (ref 0.0–0.2)

## 2019-10-03 LAB — GLUCOSE, CAPILLARY
Glucose-Capillary: 249 mg/dL — ABNORMAL HIGH (ref 70–99)
Glucose-Capillary: 366 mg/dL — ABNORMAL HIGH (ref 70–99)
Glucose-Capillary: 312 mg/dL — ABNORMAL HIGH (ref 70–99)
Glucose-Capillary: 285 mg/dL — ABNORMAL HIGH (ref 70–99)
Glucose-Capillary: 118 mg/dL — ABNORMAL HIGH (ref 70–99)
Glucose-Capillary: 173 mg/dL — ABNORMAL HIGH (ref 70–99)
Glucose-Capillary: 309 mg/dL — ABNORMAL HIGH (ref 70–99)
Glucose-Capillary: 202 mg/dL — ABNORMAL HIGH (ref 70–99)
Glucose-Capillary: 161 mg/dL — ABNORMAL HIGH (ref 70–99)

## 2019-10-03 LAB — URINALYSIS, COMPLETE (UACMP) WITH MICROSCOPIC
Bacteria, UA: NONE SEEN
Bilirubin Urine: NEGATIVE
Glucose, UA: 150 mg/dL — AB
Hgb urine dipstick: NEGATIVE
Ketones, ur: 80 mg/dL — AB
Leukocytes,Ua: NEGATIVE
Nitrite: NEGATIVE
Protein, ur: NEGATIVE mg/dL
Specific Gravity, Urine: 1.017 (ref 1.005–1.030)
Squamous Epithelial / HPF: NONE SEEN (ref 0–5)
WBC, UA: NONE SEEN WBC/hpf (ref 0–5)
pH: 5 (ref 5.0–8.0)

## 2019-10-03 LAB — BASIC METABOLIC PANEL
Anion gap: 11 (ref 5–15)
Anion gap: 12 (ref 5–15)
Anion gap: 13 (ref 5–15)
Anion gap: 13 (ref 5–15)
Anion gap: 8 (ref 5–15)
Anion gap: 9 (ref 5–15)
BUN: 10 mg/dL (ref 6–20)
BUN: 11 mg/dL (ref 6–20)
BUN: 12 mg/dL (ref 6–20)
BUN: 12 mg/dL (ref 6–20)
BUN: 13 mg/dL (ref 6–20)
BUN: 14 mg/dL (ref 6–20)
CO2: 14 mmol/L — ABNORMAL LOW (ref 22–32)
CO2: 14 mmol/L — ABNORMAL LOW (ref 22–32)
CO2: 15 mmol/L — ABNORMAL LOW (ref 22–32)
CO2: 15 mmol/L — ABNORMAL LOW (ref 22–32)
CO2: 18 mmol/L — ABNORMAL LOW (ref 22–32)
CO2: 18 mmol/L — ABNORMAL LOW (ref 22–32)
Calcium: 8.1 mg/dL — ABNORMAL LOW (ref 8.9–10.3)
Calcium: 8.5 mg/dL — ABNORMAL LOW (ref 8.9–10.3)
Calcium: 8.7 mg/dL — ABNORMAL LOW (ref 8.9–10.3)
Calcium: 8.7 mg/dL — ABNORMAL LOW (ref 8.9–10.3)
Calcium: 8.7 mg/dL — ABNORMAL LOW (ref 8.9–10.3)
Calcium: 8.9 mg/dL (ref 8.9–10.3)
Chloride: 102 mmol/L (ref 98–111)
Chloride: 104 mmol/L (ref 98–111)
Chloride: 105 mmol/L (ref 98–111)
Chloride: 105 mmol/L (ref 98–111)
Chloride: 107 mmol/L (ref 98–111)
Chloride: 107 mmol/L (ref 98–111)
Creatinine, Ser: 0.57 mg/dL (ref 0.44–1.00)
Creatinine, Ser: 0.61 mg/dL (ref 0.44–1.00)
Creatinine, Ser: 0.63 mg/dL (ref 0.44–1.00)
Creatinine, Ser: 0.72 mg/dL (ref 0.44–1.00)
Creatinine, Ser: 0.72 mg/dL (ref 0.44–1.00)
Creatinine, Ser: 0.85 mg/dL (ref 0.44–1.00)
GFR calc Af Amer: >60 mL/min (ref >60)
GFR calc Af Amer: >60 mL/min (ref >60)
GFR calc Af Amer: >60 mL/min (ref >60)
GFR calc Af Amer: >60 mL/min (ref >60)
GFR calc Af Amer: >60 mL/min (ref >60)
GFR calc Af Amer: >60 mL/min (ref >60)
GFR calc non Af Amer: >60 mL/min (ref >60)
GFR calc non Af Amer: >60 mL/min (ref >60)
GFR calc non Af Amer: >60 mL/min (ref >60)
GFR calc non Af Amer: >60 mL/min (ref >60)
GFR calc non Af Amer: >60 mL/min (ref >60)
GFR calc non Af Amer: >60 mL/min (ref >60)
Glucose, Bld: 115 mg/dL — ABNORMAL HIGH (ref 70–99)
Glucose, Bld: 248 mg/dL — ABNORMAL HIGH (ref 70–99)
Glucose, Bld: 263 mg/dL — ABNORMAL HIGH (ref 70–99)
Glucose, Bld: 297 mg/dL — ABNORMAL HIGH (ref 70–99)
Glucose, Bld: 299 mg/dL — ABNORMAL HIGH (ref 70–99)
Glucose, Bld: 415 mg/dL — ABNORMAL HIGH (ref 70–99)
Potassium: 3.3 mmol/L — ABNORMAL LOW (ref 3.5–5.1)
Potassium: 3.5 mmol/L (ref 3.5–5.1)
Potassium: 3.8 mmol/L (ref 3.5–5.1)
Potassium: 3.9 mmol/L (ref 3.5–5.1)
Potassium: 4 mmol/L (ref 3.5–5.1)
Potassium: 4.2 mmol/L (ref 3.5–5.1)
Sodium: 128 mmol/L — ABNORMAL LOW (ref 135–145)
Sodium: 131 mmol/L — ABNORMAL LOW (ref 135–145)
Sodium: 132 mmol/L — ABNORMAL LOW (ref 135–145)
Sodium: 133 mmol/L — ABNORMAL LOW (ref 135–145)
Sodium: 133 mmol/L — ABNORMAL LOW (ref 135–145)
Sodium: 133 mmol/L — ABNORMAL LOW (ref 135–145)

## 2019-10-03 LAB — BETA-HYDROXYBUTYRIC ACID
Beta-Hydroxybutyric Acid: 2.61 mmol/L — ABNORMAL HIGH (ref 0.05–0.27)
Beta-Hydroxybutyric Acid: 4.03 mmol/L — ABNORMAL HIGH (ref 0.05–0.27)

## 2019-10-03 LAB — MRSA PCR SCREENING: MRSA by PCR: NEGATIVE

## 2019-10-03 MED ORDER — DEXTROSE 50 % IV SOLN: 0.0000 mL | INTRAVENOUS | Status: DC | PRN

## 2019-10-03 MED ORDER — DEXTROSE-NACL 5-0.45 % IV SOLN: INTRAVENOUS | Status: DC

## 2019-10-03 MED ORDER — SODIUM CHLORIDE 0.9 % IV BOLUS
1000.0000 mL | INTRAVENOUS | Status: AC
  Administered 2019-10-03: 1000 mL via INTRAVENOUS

## 2019-10-03 MED ORDER — INSULIN DETEMIR 100 UNIT/ML ~~LOC~~ SOLN
45.0000 [IU] | SUBCUTANEOUS | Status: DC
  Administered 2019-10-03: 45 [IU] via SUBCUTANEOUS
  Filled 2019-10-03 (×2): qty 0.45

## 2019-10-03 MED ORDER — CHLORHEXIDINE GLUCONATE CLOTH 2 % EX PADS
6.0000 | MEDICATED_PAD | Freq: Every day | CUTANEOUS | Status: DC
  Administered 2019-10-03: 6 via TOPICAL

## 2019-10-03 MED ORDER — MUPIROCIN 2 % EX OINT
1.0000 "application " | TOPICAL_OINTMENT | Freq: Two times a day (BID) | CUTANEOUS | Status: DC
  Administered 2019-10-03 – 2019-10-04 (×2): 1 via NASAL
  Filled 2019-10-03: qty 22

## 2019-10-03 MED ORDER — INSULIN DETEMIR 100 UNIT/ML ~~LOC~~ SOLN
45.0000 [IU] | SUBCUTANEOUS | Status: DC
  Filled 2019-10-03: qty 0.45

## 2019-10-03 MED ORDER — CHLORHEXIDINE GLUCONATE 4 % EX LIQD
6.0000 "application " | Freq: Every day | CUTANEOUS | Status: DC
  Administered 2019-10-04: 6 via TOPICAL

## 2019-10-03 MED ORDER — INSULIN ASPART 100 UNIT/ML ~~LOC~~ SOLN
7.0000 [IU] | Freq: Three times a day (TID) | SUBCUTANEOUS | Status: DC
  Administered 2019-10-03: 7 [IU] via SUBCUTANEOUS

## 2019-10-03 MED ORDER — INSULIN REGULAR(HUMAN) IN NACL 100-0.9 UT/100ML-% IV SOLN
INTRAVENOUS | Status: DC
  Administered 2019-10-03: 15 [IU]/h via INTRAVENOUS
  Administered 2019-10-03: 4.6 [IU]/h via INTRAVENOUS
  Filled 2019-10-03: qty 100

## 2019-10-03 MED ORDER — SODIUM CHLORIDE 0.9 % IV SOLN: INTRAVENOUS | Status: DC

## 2019-10-03 NOTE — ED Notes (Signed)
Pt ambulated to bathroom w/o difficulty or need for assistance. Pt denies any further needs at this time.

## 2019-10-03 NOTE — ED Notes (Signed)
Pt with steady gait to bedside commode. Peri-care performed independently. Pt assisted back to bed. This RN waiting for breakfast trays to arrive to give am dose of insulin. Pt aware of plan.

## 2019-10-03 NOTE — ED Notes (Signed)
Pt given water and diet sprite.

## 2019-10-03 NOTE — Progress Notes (Signed)
Inpatient Diabetes Program Recommendations  AACE/ADA: New Consensus Statement on Inpatient Glycemic Control (2015)  Target Ranges:  Prepandial:   less than 140 mg/dL      Peak postprandial:   less than 180 mg/dL (1-2 hours)      Critically ill patients:  140 - 180 mg/dL  Results for AZLYNN, MITNICK (MRN [id number]) as of 10/03/2019 10:05  Ref. Range 10/03/2019 03:35 10/03/2019 09:15  Glucose-Capillary Latest Ref Range: 70 - 99 mg/dL 625 (H) 638 (H)   Results for CHANETTA, MOOSMAN (MRN [id number]) as of 10/03/2019 10:05  Ref. Range 10/02/2019 11:04 10/02/2019 15:21 10/02/2019 16:34 10/02/2019 17:59 10/02/2019 19:34 10/02/2019 20:55 10/02/2019 22:30 10/02/2019 23:54  Glucose-Capillary Latest Ref Range: 70 - 99 mg/dL 811 (H) 572 (H) 620 (H) 136 (H) 167 (H) 206 (H) 146 (H) 124 (H)  Results for SABRIEL, BORROMEO (MRN [id number]) as of 10/03/2019 10:05  Ref. Range 10/02/2019 17:30 10/02/2019 21:16 10/02/2019 23:44 10/03/2019 06:26  Beta-Hydroxybutyric Acid Latest Ref Range: 0.05 - 0.27 mmol/L 3.94 (H) 4.78 (H) 2.61 (H) 4.03 (H)   Review of Glycemic Control  Diabetes history: DM2 Outpatient Diabetes medications: Lantus 40 units daily, Novolog 8 units TID with meals, Glipizide XL 5 mg QAM Current orders for Inpatient glycemic control: Levemir 45 units Q24H, Novolog 7 units TID with meals, Novolog 0-9 units TID with meals, Novolog 0-5 units QHS  Inpatient Diabetes Program Recommendations:    Insulin-Noted Levemir increased to 45 units Q24H and Novolog 7 units TID with meals for meal coverage ordered this morning.  NOTE: Noted consult. Patient is currently still in the Emergency Room. Admitted with DKA and started on IV insulin. Patient was transitioned from IV to SQ insulin during the night and received Levemir 25 units at 21:35 on 10/02/19 and fasting glucose 366 mg/dl today. Noted Levemir increased to 45 units Q24H and Novolog 7 units TID with meals for meal coverage ordered this morning. Will plan to speak  with patient during admission.  Thanks, Orlando Penner, RN, MSN, CDE Diabetes Coordinator Inpatient Diabetes Program [phone] (Team Pager from 8am to 5pm)

## 2019-10-03 NOTE — ED Notes (Signed)
Lab called for 1030 lab draw due to pt being hard stick.

## 2019-10-03 NOTE — ED Notes (Signed)
Repeat BMET sent to lab at this time.

## 2019-10-03 NOTE — Progress Notes (Signed)
PROGRESS NOTE                                                                                                                                                                                                             Patient Demographics:    Pamela Lane, is a 59 y.o. female, DOB - 05/04/1961, KPT:[id number]  Admit date - 10/02/2019   Admitting Physician Lucile Shutters, MD  Outpatient Primary MD for the patient is [facility]  LOS - 1  Outpatient Specialists: None  Chief Complaint  Patient presents with  . Multiple Complaints       Brief Narrative 59 year old female with uncontrolled type 2 diabetes mellitus, asthma presented with dizziness and near syncopal episode.  Also reported having some chest discomfort.  Reports that her blood glucose have been in the 200s at home, takes her insulin but not adherent to diet. In the ED vitals were stable but noted to be in DKA with elevated anion gap and high beta hydroxybutyrate.  Given IV normal saline bolus, insulin infusion and admitted to hospital service.   Subjective:   Denies any dizziness or chest discomfort.  Stable on telemetry.  Was placed on Lantus with aspart as anion gap closed however bicarb is still low.   Assessment  & Plan :    Principal Problem:   DKA, type 2 (HCC) Likely associated with dietary nonadherence.  Concern for medication nonadherence as well. Received aggressive IV hydration and insulin drip.  Anion gap closed however still hyponatremic and bicarb <15.  CBGs in 350-450 in the last 12 hours.  Will give 1 L normal saline bolus and place her back on insulin drip until bicarb improved greater than 15 and anion gap remains closed.  Continue aggressive IV hydration. Diabetic coordinator and nutrition consulted.    Active Problems:     Bipolar disorder (HCC) Continue Latuda and bupropion.    Essential  hypertension Stable.  Continue Cardizem and lisinopril.  Chest tightness Present on admission.  Resolved.  EKG and troponin unremarkable.      Code Status : Full code  Family Communication  : None  Disposition Plan  : Home possibly in 1-2 days depending upon improvement in her blood glucose  Barriers For Discharge : Active symptoms, DKA  Consults  : None  Procedures  : None  DVT Prophylaxis  :  Lovenox -  Lab Results  Component Value Date   PLT 261 10/03/2019    Antibiotics  :   Anti-infectives (From admission, onward)   None        Objective:   Vitals:   10/03/19 0600 10/03/19 0630 10/03/19 0925 10/03/19 1146  BP: (!) 128/93 119/68 127/69 125/70  Pulse: 98 87  89  Resp:  18  17  Temp:  98 F (36.7 C)  98 F (36.7 C)  TempSrc:  Axillary    SpO2: 98% 99%  100%  Weight:      Height:        Wt Readings from Last 3 Encounters:  10/02/19 84.4 kg  05/25/19 87.3 kg  04/01/19 87.3 kg     Intake/Output Summary (Last 24 hours) at 10/03/2019 1431 Last data filed at 10/02/2019 2232 Gross per 24 hour  Intake 1000 ml  Output --  Net 1000 ml     Physical Exam  Gen: not in distress HEENT: no pallor, moist mucosa, supple neck Chest: clear b/l, no added sounds CVS: N S1&S2, no murmurs, rubs or gallop GI: soft, NT, ND, BS+ Musculoskeletal: warm, no edema CNS: AAOX3, non focal    Data Review:    CBC Recent Labs  Lab 10/02/19 1116 10/02/19 1730 10/03/19 0330  WBC 11.9* 10.3 7.7  HGB 16.5* 16.4* 14.0  HCT 47.5* 46.8* 41.6  PLT 341 310 261  MCV 90.5 89.0 89.8  MCH 31.4 31.2 30.2  MCHC 34.7 35.0 33.7  RDW 12.4 12.5 12.4  LYMPHSABS  --  2.6  --   MONOABS  --  0.6  --   EOSABS  --  0.1  --   BASOSABS  --  0.0  --     Chemistries  Recent Labs  Lab 10/02/19 2116 10/02/19 2344 10/03/19 0330 10/03/19 0626 10/03/19 1101  NA 132* 133* 133* 131* 128*  K 4.1 4.0 4.2 3.8 3.9  CL 103 107 105 104 102  CO2 13* 14* 15* 14* 15*  GLUCOSE 209* 115*  263* 297* 415*  BUN 14 13 14 12 12   CREATININE 0.87 0.72 0.72 0.63 0.85  CALCIUM 8.8* 8.7* 8.9 8.7* 8.7*   ------------------------------------------------------------------------------------------------------------------ No results for input(s): CHOL, HDL, LDLCALC, TRIG, CHOLHDL, LDLDIRECT in the last 72 hours.  Lab Results  Component Value Date   HGBA1C 13.3 (H) 04/02/2019   ------------------------------------------------------------------------------------------------------------------ No results for input(s): TSH, T4TOTAL, T3FREE, THYROIDAB in the last 72 hours.  Invalid input(s): FREET3 ------------------------------------------------------------------------------------------------------------------ No results for input(s): VITAMINB12, FOLATE, FERRITIN, TIBC, IRON, RETICCTPCT in the last 72 hours.  Coagulation profile No results for input(s): INR, PROTIME in the last 168 hours.  No results for input(s): DDIMER in the last 72 hours.  Cardiac Enzymes No results for input(s): CKMB, TROPONINI, MYOGLOBIN in the last 168 hours.  Invalid input(s): CK ------------------------------------------------------------------------------------------------------------------ No results found for: BNP  Inpatient Medications  Scheduled Meds: . aspirin EC  81 mg Oral Daily  . buPROPion  150 mg Oral BID  . diltiazem  120 mg Oral Daily  . enoxaparin (LOVENOX) injection  40 mg Subcutaneous Q24H  . folic acid  1 mg Oral Daily  . gabapentin  300 mg Oral QHS  . insulin aspart  0-5 Units Subcutaneous QHS  . insulin aspart  0-9 Units Subcutaneous TID WC  . insulin aspart  7 Units Subcutaneous TID WC  . insulin detemir  45 Units Subcutaneous Q24H  . lisinopril  10 mg Oral Daily  .  loratadine  10 mg Oral Daily  . lurasidone  40 mg Oral QPM  . multivitamin with minerals  1 tablet Oral Daily  . thiamine  100 mg Oral Daily   Continuous Infusions: . sodium chloride 125 mL/hr at 10/03/19 0926  .  sodium chloride    . dextrose 5 % and 0.45% NaCl    . insulin    . sodium chloride     PRN Meds:.acetaminophen **OR** acetaminophen, dextrose, dextrose, ipratropium-albuterol, ondansetron **OR** ondansetron (ZOFRAN) IV, senna-docusate  Micro Results Recent Results (from the past 240 hour(s))  SARS CORONAVIRUS 2 (TAT 6-24 HRS) Nasopharyngeal Nasopharyngeal Swab     Status: None   Collection Time: 10/02/19  3:55 PM   Specimen: Nasopharyngeal Swab  Result Value Ref Range Status   SARS Coronavirus 2 NEGATIVE NEGATIVE Final    Comment: (NOTE) SARS-CoV-2 target nucleic acids are NOT DETECTED. The SARS-CoV-2 RNA is generally detectable in upper and lower respiratory specimens during the acute phase of infection. Negative results do not preclude SARS-CoV-2 infection, do not rule out co-infections with other pathogens, and should not be used as the sole basis for treatment or other patient management decisions. Negative results must be combined with clinical observations, patient history, and epidemiological information. The expected result is Negative. Fact Sheet for Patients: SugarRoll.be Fact Sheet for Healthcare Providers: https://www.woods-mathews.com/ This test is not yet approved or cleared by the [place] FDA and  has been authorized for detection and/or diagnosis of SARS-CoV-2 by FDA under an Emergency Use Authorization (EUA). This EUA will remain  in effect (meaning this test can be used) for the duration of the COVID-19 declaration under Section 56 4(b)(1) of the Act, 21 U.S.C. section 360bbb-3(b)(1), unless the authorization is terminated or revoked sooner. Performed at [facility], [place] [address], [place], [place] [address]     Radiology Reports No results found.  Time Spent in minutes 35   Jerrad Mendibles M.D on 10/03/2019 at 2:31 PM  Between 7am to 7pm - Pager - [phone]  After 7pm go to [email or web address] -  password [facility]  [facility] -  Office  [phone]

## 2019-10-03 NOTE — ED Notes (Signed)
Lab at bedside

## 2019-10-03 NOTE — Progress Notes (Signed)
Pt arrived on unit at this time. A&O X4. Insulin gtt infusing. VSS on room air.

## 2019-10-03 NOTE — ED Notes (Signed)
Pt given milk to drink at this time.

## 2019-10-03 NOTE — ED Notes (Signed)
Pt given meal tray.

## 2019-10-04 DIAGNOSIS — Z720 Tobacco use: Secondary | ICD-10-CM | POA: Diagnosis present

## 2019-10-04 LAB — BASIC METABOLIC PANEL
Anion gap: 8 (ref 5–15)
BUN: 11 mg/dL (ref 6–20)
CO2: 21 mmol/L — ABNORMAL LOW (ref 22–32)
Calcium: 8.8 mg/dL — ABNORMAL LOW (ref 8.9–10.3)
Chloride: 105 mmol/L (ref 98–111)
Creatinine, Ser: 0.62 mg/dL (ref 0.44–1.00)
GFR calc Af Amer: >60 mL/min (ref >60)
GFR calc non Af Amer: >60 mL/min (ref >60)
Glucose, Bld: 285 mg/dL — ABNORMAL HIGH (ref 70–99)
Potassium: 3.7 mmol/L (ref 3.5–5.1)
Sodium: 134 mmol/L — ABNORMAL LOW (ref 135–145)

## 2019-10-04 LAB — HEMOGLOBIN A1C
Hgb A1c MFr Bld: >15.5 % — ABNORMAL HIGH (ref 4.8–5.6)
Mean Plasma Glucose: >398 mg/dL

## 2019-10-04 LAB — GLUCOSE, CAPILLARY
Glucose-Capillary: 229 mg/dL — ABNORMAL HIGH (ref 70–99)
Glucose-Capillary: 239 mg/dL — ABNORMAL HIGH (ref 70–99)
Glucose-Capillary: 282 mg/dL — ABNORMAL HIGH (ref 70–99)
Glucose-Capillary: 312 mg/dL — ABNORMAL HIGH (ref 70–99)

## 2019-10-04 MED ORDER — INSULIN GLARGINE 100 UNIT/ML SOLOSTAR PEN: 50.0000 [IU] | PEN_INJECTOR | Freq: Every day | SUBCUTANEOUS | 1 refills | Status: DC

## 2019-10-04 MED ORDER — INSULIN ASPART 100 UNIT/ML ~~LOC~~ SOLN
0.0000 [IU] | Freq: Three times a day (TID) | SUBCUTANEOUS | Status: DC
  Administered 2019-10-04: 11 [IU] via SUBCUTANEOUS
  Administered 2019-10-04: 11:00:00 8 [IU] via SUBCUTANEOUS
  Filled 2019-10-04 (×2): qty 1

## 2019-10-04 MED ORDER — NOVOLOG FLEXPEN 100 UNIT/ML ~~LOC~~ SOPN: 12.0000 [IU] | PEN_INJECTOR | Freq: Three times a day (TID) | SUBCUTANEOUS | 11 refills | Status: DC

## 2019-10-04 MED ORDER — INSULIN ASPART 100 UNIT/ML ~~LOC~~ SOLN
11.0000 [IU] | Freq: Three times a day (TID) | SUBCUTANEOUS | Status: DC
  Administered 2019-10-04 (×2): 11 [IU] via SUBCUTANEOUS
  Filled 2019-10-04 (×2): qty 1

## 2019-10-04 NOTE — Discharge Instructions (Signed)
Diabetes Mellitus and Nutrition, Adult When you have diabetes (diabetes mellitus), it is very important to have healthy eating habits because your blood sugar (glucose) levels are greatly affected by what you eat and drink. Eating healthy foods in the appropriate amounts, at about the same times every day, can help you:  Control your blood glucose.  Lower your risk of heart disease.  Improve your blood pressure.  Reach or maintain a healthy weight. Every person with diabetes is different, and each person has different needs for a meal plan. Your health care provider may recommend that you work with a diet and nutrition specialist (dietitian) to make a meal plan that is best for you. Your meal plan may vary depending on factors such as:  The calories you need.  The medicines you take.  Your weight.  Your blood glucose, blood pressure, and cholesterol levels.  Your activity level.  Other health conditions you have, such as heart or kidney disease. How do carbohydrates affect me? Carbohydrates, also called carbs, affect your blood glucose level more than any other type of food. Eating carbs naturally raises the amount of glucose in your blood. Carb counting is a method for keeping track of how many carbs you eat. Counting carbs is important to keep your blood glucose at a healthy level, especially if you use insulin or take certain oral diabetes medicines. It is important to know how many carbs you can safely have in each meal. This is different for every person. Your dietitian can help you calculate how many carbs you should have at each meal and for each snack. Foods that contain carbs include:  Bread, cereal, rice, pasta, and crackers.  Potatoes and corn.  Peas, beans, and lentils.  Milk and yogurt.  Fruit and juice.  Desserts, such as cakes, cookies, ice cream, and candy. How does alcohol affect me? Alcohol can cause a sudden decrease in blood glucose (hypoglycemia),  especially if you use insulin or take certain oral diabetes medicines. Hypoglycemia can be a life-threatening condition. Symptoms of hypoglycemia (sleepiness, dizziness, and confusion) are similar to symptoms of having too much alcohol. If your health care provider says that alcohol is safe for you, follow these guidelines:  Limit alcohol intake to no more than 1 drink per day for nonpregnant women and 2 drinks per day for men. One drink equals 12 oz of beer, 5 oz of wine, or 1 oz of hard liquor.  Do not drink on an empty stomach.  Keep yourself hydrated with water, diet soda, or unsweetened iced tea.  Keep in mind that regular soda, juice, and other mixers may contain a lot of sugar and must be counted as carbs. What are tips for following this plan?  Reading food labels  Start by checking the serving size on the "Nutrition Facts" label of packaged foods and drinks. The amount of calories, carbs, fats, and other nutrients listed on the label is based on one serving of the item. Many items contain more than one serving per package.  Check the total grams (g) of carbs in one serving. You can calculate the number of servings of carbs in one serving by dividing the total carbs by 15. For example, if a food has 30 g of total carbs, it would be equal to 2 servings of carbs.  Check the number of grams (g) of saturated and trans fats in one serving. Choose foods that have low or no amount of these fats.  Check the number of   milligrams (mg) of salt (sodium) in one serving. Most people should limit total sodium intake to less than 2,300 mg per day.  Always check the nutrition information of foods labeled as "low-fat" or "nonfat". These foods may be higher in added sugar or refined carbs and should be avoided.  Talk to your dietitian to identify your daily goals for nutrients listed on the label. Shopping  Avoid buying canned, premade, or processed foods. These foods tend to be high in fat, sodium,  and added sugar.  Shop around the outside edge of the grocery store. This includes fresh fruits and vegetables, bulk grains, fresh meats, and fresh dairy. Cooking  Use low-heat cooking methods, such as baking, instead of high-heat cooking methods like deep frying.  Cook using healthy oils, such as olive, canola, or sunflower oil.  Avoid cooking with butter, cream, or high-fat meats. Meal planning  Eat meals and snacks regularly, preferably at the same times every day. Avoid going long periods of time without eating.  Eat foods high in fiber, such as fresh fruits, vegetables, beans, and whole grains. Talk to your dietitian about how many servings of carbs you can eat at each meal.  Eat 4-6 ounces (oz) of lean protein each day, such as lean meat, chicken, fish, eggs, or tofu. One oz of lean protein is equal to: ? 1 oz of meat, chicken, or fish. ? 1 egg. ?  cup of tofu.  Eat some foods each day that contain healthy fats, such as avocado, nuts, seeds, and fish. Lifestyle  Check your blood glucose regularly.  Exercise regularly as told by your health care provider. This may include: ? 150 minutes of moderate-intensity or vigorous-intensity exercise each week. This could be brisk walking, biking, or water aerobics. ? Stretching and doing strength exercises, such as yoga or weightlifting, at least 2 times a week.  Take medicines as told by your health care provider.  Do not use any products that contain nicotine or tobacco, such as cigarettes and e-cigarettes. If you need help quitting, ask your health care provider.  Work with a Veterinary surgeon or diabetes educator to identify strategies to manage stress and any emotional and social challenges. Questions to ask a health care provider  Do I need to meet with a diabetes educator?  Do I need to meet with a dietitian?  What number can I call if I have questions?  When are the best times to check my blood glucose? Where to find more  information:  American Diabetes Association: diabetes.org  Academy of Nutrition and Dietetics: www.eatright.[employer] of Diabetes and Digestive and Kidney Diseases (NIH): CarFlippers.tn Summary  A healthy meal plan will help you control your blood glucose and maintain a healthy lifestyle.  Working with a diet and nutrition specialist (dietitian) can help you make a meal plan that is best for you.  Keep in mind that carbohydrates (carbs) and alcohol have immediate effects on your blood glucose levels. It is important to count carbs and to use alcohol carefully. This information is not intended to replace advice given to you by your health care provider. Make sure you discuss any questions you have with your health care provider. Document Revised: 06/10/2017 Document Reviewed: 08/02/2016 Elsevier Patient Education  2020 Elsevier Inc.    Blood Glucose Monitoring, Adult Monitoring your blood sugar (glucose) is an important part of managing your diabetes (diabetes mellitus). Blood glucose monitoring involves checking your blood glucose as often as directed and keeping a record (log)  of your results over time. Checking your blood glucose regularly and keeping a blood glucose log can:  Help you and your health care provider adjust your diabetes management plan as needed, including your medicines or insulin.  Help you understand how food, exercise, illnesses, and medicines affect your blood glucose.  Let you know what your blood glucose is at any time. You can quickly find out if you have low blood glucose (hypoglycemia) or high blood glucose (hyperglycemia). Your health care provider will set individualized treatment goals for you. Your goals will be based on your age, other medical conditions you have, and how you respond to diabetes treatment. Generally, the goal of treatment is to maintain the following blood glucose levels:  Before meals (preprandial): 80-130 mg/dL  (4.1-6.6 mmol/L).  After meals (postprandial): below 180 mg/dL (10 mmol/L).  A1c level: less than 7%. Supplies needed:  Blood glucose meter.  Test strips for your meter. Each meter has its own strips. You must use the strips that came with your meter.  A needle to prick your finger (lancet). Do not use a lancet more than one time.  A device that holds the lancet (lancing device).  A journal or log book to write down your results. How to check your blood glucose  1. Wash your hands with soap and water. 2. Prick the side of your finger (not the tip) with the lancet. Use a different finger each time. 3. Gently rub the finger until a small drop of blood appears. 4. Follow instructions that come with your meter for inserting the test strip, applying blood to the strip, and using your blood glucose meter. 5. Write down your result and any notes. Some meters allow you to use areas of your body other than your finger (alternative sites) to test your blood. The most common alternative sites are:  Forearm.  Thigh.  Palm of the hand. If you think you may have hypoglycemia, or if you have a history of not knowing when your blood glucose is getting low (hypoglycemia unawareness), do not use alternative sites. Use your finger instead. Alternative sites may not be as accurate as the fingers, because blood flow is slower in these areas. This means that the result you get may be delayed, and it may be different from the result that you would get from your finger. Follow these instructions at home: Blood glucose log   Every time you check your blood glucose, write down your result. Also write down any notes about things that may be affecting your blood glucose, such as your diet and exercise for the day. This information can help you and your health care provider: ? Look for patterns in your blood glucose over time. ? Adjust your diabetes management plan as needed.  Check if your meter allows  you to download your records to a computer. Most glucose meters store a record of glucose readings in the meter. If you have type 1 diabetes:  Check your blood glucose 2 or more times a day.  Also check your blood glucose: ? Before every insulin injection. ? Before and after exercise. ? Before meals. ? 2 hours after a meal. ? Occasionally between 2:00 a.m. and 3:00 a.m., as directed. ? Before potentially dangerous tasks, like driving or using heavy machinery. ? At bedtime.  You may need to check your blood glucose more often, up to 6-10 times a day, if you: ? Use an insulin pump. ? Need multiple daily injections (MDI). ? Have  diabetes that is not well-controlled. ? Are ill. ? Have a history of severe hypoglycemia. ? Have hypoglycemia unawareness. If you have type 2 diabetes:  If you take insulin or other diabetes medicines, check your blood glucose 2 or more times a day.  If you are on intensive insulin therapy, check your blood glucose 4 or more times a day. Occasionally, you may also need to check between 2:00 a.m. and 3:00 a.m., as directed.  Also check your blood glucose: ? Before and after exercise. ? Before potentially dangerous tasks, like driving or using heavy machinery.  You may need to check your blood glucose more often if: ? Your medicine is being adjusted. ? Your diabetes is not well-controlled. ? You are ill. General tips  Always keep your supplies with you.  If you have questions or need help, all blood glucose meters have a 24-hour "hotline" phone number that you can call. You may also contact your health care provider.  After you use a few boxes of test strips, adjust (calibrate) your blood glucose meter by following instructions that came with your meter. Contact a health care provider if:  Your blood glucose is at or above 240 mg/dL (13.3 mmol/L) for 2 days in a row.  You have been sick or have had a fever for 2 days or longer, and you are not getting  better.  You have any of the following problems for more than 6 hours: ? You cannot eat or drink. ? You have nausea or vomiting. ? You have diarrhea. Get help right away if:  Your blood glucose is lower than 54 mg/dL (3 mmol/L).  You become confused or you have trouble thinking clearly.  You have difficulty breathing.  You have moderate or large ketone levels in your urine. Summary  Monitoring your blood sugar (glucose) is an important part of managing your diabetes (diabetes mellitus).  Blood glucose monitoring involves checking your blood glucose as often as directed and keeping a record (log) of your results over time.  Your health care provider will set individualized treatment goals for you. Your goals will be based on your age, other medical conditions you have, and how you respond to diabetes treatment.  Every time you check your blood glucose, write down your result. Also write down any notes about things that may be affecting your blood glucose, such as your diet and exercise for the day. This information is not intended to replace advice given to you by your health care provider. Make sure you discuss any questions you have with your health care provider. Document Revised: 04/21/2018 Document Reviewed: 12/08/2015 Elsevier Patient Education  2020 [employer].

## 2019-10-04 NOTE — Progress Notes (Signed)
Pt discharged at this time. DC education given to and reviewed with patient. No questions at this time. VSS prior to discharge.

## 2019-10-04 NOTE — Discharge Summary (Signed)
Physician Discharge Summary  Pamela Lane KKX:[id number] DOB: 09-15-1960 DOA: 10/02/2019  PCP: [facility]  Admit date: 10/02/2019 Discharge date: 10/04/2019  Admitted From: Home Disposition: Home  Recommendations for Outpatient Follow-up:  1. Follow up with PCP in 1-2 weeks   Home Health: None Equipment/Devices: None  Discharge Condition: Fair CODE STATUS: Full code Diet recommendation: Carb modified    Discharge Diagnoses:  Principal Problem:   DKA, type 2 (HCC)  Active Problems: Diabetes mellitus type 2, uncontrolled with hyperglycemia, with long-term use of insulin   Essential hypertension   Hyponatremia   Bipolar disorder (HCC)   Tobacco abuse  Brief narrative/HPI 59 year old female with uncontrolled type 2 diabetes mellitus, asthma presented with dizziness and near syncopal episode.  Also reported having some chest discomfort.  Reports that her blood glucose have been in the 200s at home, takes her insulin but not adherent to diet. In the ED vitals were stable but noted to be in DKA with elevated anion gap and high beta hydroxybutyrate.  Given IV normal saline bolus, insulin infusion and admitted to hospital service.  Hospital course  Principal Problem:   DKA, type 2  (HCC) Likely associated with dietary nonadherence given persistently elevated A1c in the past.  Patient reports that she eats a lot of junk food when she has craving for smoking. Concern for medication nonadherence as well. Received aggressive IV hydration and was placed on insulin drip.  Anion gap closed (was placed back on insulin drip on day 2 given low bicarb and persistently elevated blood glucose). CBG now in 250-350. Patient reports being on 30 units of Lantus at home along with Premeal aspart of 8 units 3 times daily.  Her A1c remains uncontrolled at >15.5. We will increase her Lantus to 50 units daily and Premeal aspart to 12 units 3 times a day with meals.   Continue glipizide. Counseled strongly on dietary adherence and medication compliance. Diabetic coordinator following.  Patient stable to be discharged home with outpatient PCP follow-up.    Active Problems:     Bipolar disorder (HCC) Continue Latuda and bupropion.    Essential hypertension Stable.  Continue Cardizem and lisinopril.  Chest tightness Present on admission.  Resolved.  EKG and troponin unremarkable.    Tobacco use Counseled on cessation.  Plans to quit.  Family Communication  : None  Disposition Plan  : Home Consults  : None  Procedures  : None   Discharge Instructions   Allergies as of 10/04/2019   No Known Allergies     Medication List    TAKE these medications   albuterol 108 (90 Base) MCG/ACT inhaler Commonly known as: VENTOLIN HFA Inhale 2 puffs into the lungs every 6 (six) hours as needed for wheezing or shortness of breath.   aspirin EC 81 MG tablet Take 81 mg by mouth daily.   buPROPion 150 MG 12 hr tablet Commonly known as: WELLBUTRIN SR Take 150 mg by mouth 2 (two) times daily.   diltiazem 120 MG 24 hr capsule Commonly known as: CARDIZEM CD Take 1 capsule (120 mg total) by mouth 2 (two) times a day.   folic acid 1 MG tablet Commonly known as: FOLVITE Take 1 tablet (1 mg total) by mouth daily.   gabapentin 300 MG capsule Commonly known as: NEURONTIN Take 300 mg by mouth at bedtime.   glipiZIDE 5 MG 24 hr tablet Commonly known as: GLUCOTROL XL Take 1 tablet (5 mg total) by mouth daily with breakfast.  insulin glargine 100 UNIT/ML Solostar Pen Commonly known as: LANTUS Inject 50 Units into the skin daily. What changed: how much to take   ipratropium-albuterol 0.5-2.5 (3) MG/3ML Soln Commonly known as: DUONEB Take 3 mLs by nebulization every 6 (six) hours as needed for wheezing, shortness of breath or cough.   lisinopril 10 MG tablet Commonly known as: Prinivil Take 1 tablet (10 mg total) by mouth daily.    lurasidone 40 MG Tabs tablet Commonly known as: LATUDA Take 1 tablet (40 mg total) by mouth every evening.   multivitamin with minerals Tabs tablet Take 1 tablet by mouth daily.   NovoLOG FlexPen 100 UNIT/ML FlexPen Generic drug: insulin aspart Inject 12 Units into the skin 3 (three) times daily with meals. What changed: how much to take   thiamine 100 MG tablet Take 1 tablet (100 mg total) by mouth daily.      Follow-up Information    [facility]. Schedule an appointment as soon as possible for a visit in 1 week(s).   Specialty: General Practice Contact information: [address] [place] [address] [phone]          No Known Allergies     Procedures/Studies:  No results found.   Subjective: Feels better today.  CBG in the range of 250-350.  Discharge Exam: Vitals:   10/04/19 0900 10/04/19 1000  BP: (!) 146/131   Pulse: 100 (!) 105  Resp: 11   Temp:    SpO2: 100% 99%   Vitals:   10/04/19 0700 10/04/19 0800 10/04/19 0900 10/04/19 1000  BP: 122/78 122/77 (!) 146/131   Pulse: 87 90 100 (!) 105  Resp: 14 17 11    Temp:  98 F (36.7 C)    TempSrc:  Oral    SpO2: 97% 100% 100% 99%  Weight:      Height:        General: 59-year-old female not in distress HEENT: Moist mucosa, supple neck Chest: Clear CVS: Normal S1-S2, no murmurs GI: Soft, nondistended, nontender Musculoskeletal: Warm, no edema    The results of significant diagnostics from this hospitalization (including imaging, microbiology, ancillary and laboratory) are listed below for reference.     Microbiology: Recent Results (from the past 240 hour(s))  SARS CORONAVIRUS 2 (TAT 6-24 HRS) Nasopharyngeal Nasopharyngeal Swab     Status: None   Collection Time: 10/02/19  3:55 PM   Specimen: Nasopharyngeal Swab  Result Value Ref Range Status   SARS Coronavirus 2 NEGATIVE NEGATIVE Final    Comment: (NOTE) SARS-CoV-2 target nucleic acids are  NOT DETECTED. The SARS-CoV-2 RNA is generally detectable in upper and lower respiratory specimens during the acute phase of infection. Negative results do not preclude SARS-CoV-2 infection, do not rule out co-infections with other pathogens, and should not be used as the sole basis for treatment or other patient management decisions. Negative results must be combined with clinical observations, patient history, and epidemiological information. The expected result is Negative. Fact Sheet for Patients: 10/04/19 Fact Sheet for Healthcare Providers: HairSlick.no This test is not yet approved or cleared by the quierodirigir.com FDA and  has been authorized for detection and/or diagnosis of SARS-CoV-2 by FDA under an Emergency Use Authorization (EUA). This EUA will remain  in effect (meaning this test can be used) for the duration of the COVID-19 declaration under Section 56 4(b)(1) of the Act, 21 U.S.C. section 360bbb-3(b)(1), unless the authorization is terminated or revoked sooner. Performed at [facility],  [address], [place], [place] [address]   MRSA PCR Screening     Status: None   Collection Time: 10/03/19  6:30 PM   Specimen: Nasal Mucosa; Nasopharyngeal  Result Value Ref Range Status   MRSA by PCR NEGATIVE NEGATIVE Final    Comment:        The GeneXpert MRSA Assay (FDA approved for NASAL specimens only), is one component of a comprehensive MRSA colonization surveillance program. It is not intended to diagnose MRSA infection nor to guide or monitor treatment for MRSA infections. Performed at [facility], [address] [place], [place] [address]      Labs: BNP (last 3 results) No results for input(s): BNP in the last 8760 hours. Basic Metabolic Panel: Recent Labs  Lab 10/03/19 0626 10/03/19 1101 10/03/19 1658 10/03/19 1821 10/04/19 0651  NA 131* 128* 133* 132* 134*  K 3.8 3.9 3.3*  3.5 3.7  CL 104 102 107 105 105  CO2 14* 15* 18* 18* 21*  GLUCOSE 297* 415* 248* 299* 285*  BUN 12 12 10 11 11   CREATININE 0.63 0.85 0.57 0.61 0.62  CALCIUM 8.7* 8.7* 8.1* 8.5* 8.8*   Liver Function Tests: No results for input(s): AST, ALT, ALKPHOS, BILITOT, PROT, ALBUMIN in the last 168 hours. No results for input(s): LIPASE, AMYLASE in the last 168 hours. No results for input(s): AMMONIA in the last 168 hours. CBC: Recent Labs  Lab 10/02/19 1116 10/02/19 1730 10/03/19 0330  WBC 11.9* 10.3 7.7  NEUTROABS  --  6.9  --   HGB 16.5* 16.4* 14.0  HCT 47.5* 46.8* 41.6  MCV 90.5 89.0 89.8  PLT 341 310 261   Cardiac Enzymes: No results for input(s): CKTOTAL, CKMB, CKMBINDEX, TROPONINI in the last 168 hours. BNP: Invalid input(s): POCBNP CBG: Recent Labs  Lab 10/03/19 1922 10/03/19 2022 10/03/19 2129 10/04/19 0005 10/04/19 0713  GLUCAP 309* 202* 161* 229* 312*   D-Dimer No results for input(s): DDIMER in the last 72 hours. Hgb A1c Recent Labs    10/02/19 1730  HGBA1C >15.5*   Lipid Profile No results for input(s): CHOL, HDL, LDLCALC, TRIG, CHOLHDL, LDLDIRECT in the last 72 hours. Thyroid function studies No results for input(s): TSH, T4TOTAL, T3FREE, THYROIDAB in the last 72 hours.  Invalid input(s): FREET3 Anemia work up No results for input(s): VITAMINB12, FOLATE, FERRITIN, TIBC, IRON, RETICCTPCT in the last 72 hours. Urinalysis    Component Value Date/Time   COLORURINE YELLOW (A) 10/02/2019 2355   APPEARANCEUR CLOUDY (A) 10/02/2019 2355   LABSPEC 1.017 10/02/2019 2355   PHURINE 5.0 10/02/2019 2355   GLUCOSEU 150 (A) 10/02/2019 2355   HGBUR NEGATIVE 10/02/2019 2355   BILIRUBINUR NEGATIVE 10/02/2019 2355   KETONESUR 80 (A) 10/02/2019 2355   PROTEINUR NEGATIVE 10/02/2019 2355   NITRITE NEGATIVE 10/02/2019 2355   LEUKOCYTESUR NEGATIVE 10/02/2019 2355   Sepsis Labs Invalid input(s): PROCALCITONIN,  WBC,  LACTICIDVEN Microbiology Recent Results (from the  past 240 hour(s))  SARS CORONAVIRUS 2 (TAT 6-24 HRS) Nasopharyngeal Nasopharyngeal Swab     Status: None   Collection Time: 10/02/19  3:55 PM   Specimen: Nasopharyngeal Swab  Result Value Ref Range Status   SARS Coronavirus 2 NEGATIVE NEGATIVE Final    Comment: (NOTE) SARS-CoV-2 target nucleic acids are NOT DETECTED. The SARS-CoV-2 RNA is generally detectable in upper and lower respiratory specimens during the acute phase of infection. Negative results do not preclude SARS-CoV-2 infection, do not rule out co-infections with other pathogens, and should not be  used as the sole basis for treatment or other patient management decisions. Negative results must be combined with clinical observations, patient history, and epidemiological information. The expected result is Negative. Fact Sheet for Patients: SugarRoll.be Fact Sheet for Healthcare Providers: https://www.woods-mathews.com/ This test is not yet approved or cleared by the [place] FDA and  has been authorized for detection and/or diagnosis of SARS-CoV-2 by FDA under an Emergency Use Authorization (EUA). This EUA will remain  in effect (meaning this test can be used) for the duration of the COVID-19 declaration under Section 56 4(b)(1) of the Act, 21 U.S.C. section 360bbb-3(b)(1), unless the authorization is terminated or revoked sooner. Performed at [facility], [place] [address], [place], [place] [address]   MRSA PCR Screening     Status: None   Collection Time: 10/03/19  6:30 PM   Specimen: Nasal Mucosa; Nasopharyngeal  Result Value Ref Range Status   MRSA by PCR NEGATIVE NEGATIVE Final    Comment:        The GeneXpert MRSA Assay (FDA approved for NASAL specimens only), is one component of a comprehensive MRSA colonization surveillance program. It is not intended to diagnose MRSA infection nor to guide or monitor treatment for MRSA infections. Performed at [facility], [address] [place], [place] [address]      Time coordinating discharge: 35 minutes  SIGNED:   Louellen Molder, MD  [facility] 10/04/2019, 10:51 AM Pager   If 7PM-7AM, please contact night-coverage [email or web address] Password [email or web address]

## 2019-10-04 NOTE — TOC Transition Note (Signed)
Transition of Care [facility]) - CM/SW Discharge Note   Patient Details  Name: SAWSAN RIGGIO MRN: [id number] Date of Birth: [birth year]  [facility] CM/SW Contact:  Magnus Ivan, LCSW Phone Number: 10/04/2019, 11:25 AM   Clinical Narrative:    CSW has orders to discharge today. CSW met with patient at bedside. Explained CSW role. Patient reported she lives with a friend and uses [facility] for transport. PCP is Princella Ion. Patient reported she uses Product/process development scientist on [employer] and denied issues with obtaining medications. Patient reported she has a rolling walker but does not currently use it. Patient denied HH/SNF history. Patient denied any needs and was encouraged to reach out if needs arise. CSW signing off.     Final next level of care: Home/Self Care Barriers to Discharge: Barriers Resolved   Patient Goals and CMS Choice        Discharge Placement                       Discharge Plan and Services                                     Social Determinants of Health (SDOH) Interventions     Readmission Risk Interventions Readmission Risk Prevention Plan 10/04/2019  Transportation Screening Complete  PCP or Specialist Appt within 3-5 Days Complete  HRI or Home Care Consult Complete  Medication Review (RN Care Manager) Complete  Some recent data might be hidden

## 2019-12-13 ENCOUNTER — Encounter: Payer: Self-pay | Admitting: *Deleted

## 2019-12-13 ENCOUNTER — Emergency Department
Admission: EM | Admit: 2019-12-13 | Discharge: 2019-12-13 | Disposition: A | Discharge status: Home / Self Care | Payer: Medicaid Other | Attending: Emergency Medicine | Admitting: Emergency Medicine

## 2019-12-13 DIAGNOSIS — J45909 Unspecified asthma, uncomplicated: Secondary | ICD-10-CM | POA: Insufficient documentation

## 2019-12-13 DIAGNOSIS — E119 Type 2 diabetes mellitus without complications: Secondary | ICD-10-CM | POA: Insufficient documentation

## 2019-12-13 DIAGNOSIS — Z7982 Long term (current) use of aspirin: Secondary | ICD-10-CM | POA: Diagnosis not present

## 2019-12-13 DIAGNOSIS — F1210 Cannabis abuse, uncomplicated: Secondary | ICD-10-CM | POA: Insufficient documentation

## 2019-12-13 DIAGNOSIS — R42 Dizziness and giddiness: Secondary | ICD-10-CM | POA: Diagnosis not present

## 2019-12-13 DIAGNOSIS — R55 Syncope and collapse: Secondary | ICD-10-CM | POA: Diagnosis present

## 2019-12-13 DIAGNOSIS — M79631 Pain in right forearm: Secondary | ICD-10-CM | POA: Insufficient documentation

## 2019-12-13 DIAGNOSIS — I10 Essential (primary) hypertension: Secondary | ICD-10-CM | POA: Diagnosis not present

## 2019-12-13 DIAGNOSIS — R519 Headache, unspecified: Secondary | ICD-10-CM | POA: Insufficient documentation

## 2019-12-13 DIAGNOSIS — F1410 Cocaine abuse, uncomplicated: Secondary | ICD-10-CM | POA: Insufficient documentation

## 2019-12-13 DIAGNOSIS — F17210 Nicotine dependence, cigarettes, uncomplicated: Secondary | ICD-10-CM | POA: Diagnosis not present

## 2019-12-13 DIAGNOSIS — Z79899 Other long term (current) drug therapy: Secondary | ICD-10-CM | POA: Diagnosis not present

## 2019-12-13 DIAGNOSIS — Z794 Long term (current) use of insulin: Secondary | ICD-10-CM | POA: Insufficient documentation

## 2019-12-13 LAB — CBC
HCT: 40.5 % (ref 36.0–46.0)
Hemoglobin: 13.8 g/dL (ref 12.0–15.0)
MCH: 31.4 pg (ref 26.0–34.0)
MCHC: 34.1 g/dL (ref 30.0–36.0)
MCV: 92.3 fL (ref 80.0–100.0)
Platelets: 345 10*3/uL (ref 150–400)
RBC: 4.39 MIL/uL (ref 3.87–5.11)
RDW: 12 % (ref 11.5–15.5)
WBC: 8.7 10*3/uL (ref 4.0–10.5)
nRBC: 0 % (ref 0.0–0.2)

## 2019-12-13 LAB — BASIC METABOLIC PANEL
Anion gap: 9 (ref 5–15)
BUN: 23 mg/dL — ABNORMAL HIGH (ref 6–20)
CO2: 24 mmol/L (ref 22–32)
Calcium: 9.2 mg/dL (ref 8.9–10.3)
Chloride: 104 mmol/L (ref 98–111)
Creatinine, Ser: 0.86 mg/dL (ref 0.44–1.00)
GFR calc Af Amer: >60 mL/min (ref >60)
GFR calc non Af Amer: >60 mL/min (ref >60)
Glucose, Bld: 149 mg/dL — ABNORMAL HIGH (ref 70–99)
Potassium: 4.2 mmol/L (ref 3.5–5.1)
Sodium: 137 mmol/L (ref 135–145)

## 2019-12-13 LAB — URINALYSIS, COMPLETE (UACMP) WITH MICROSCOPIC
Bilirubin Urine: NEGATIVE
Glucose, UA: 50 mg/dL — AB
Hgb urine dipstick: NEGATIVE
Ketones, ur: NEGATIVE mg/dL
Nitrite: NEGATIVE
Protein, ur: NEGATIVE mg/dL
Specific Gravity, Urine: 1.023 (ref 1.005–1.030)
pH: 5 (ref 5.0–8.0)

## 2019-12-13 MED ORDER — SODIUM CHLORIDE 0.9% FLUSH: 3.0000 mL | Freq: Once | INTRAVENOUS | Status: DC

## 2019-12-13 NOTE — ED Notes (Signed)
Unable to obtain e-signature at this time

## 2019-12-13 NOTE — ED Notes (Signed)
Pt stating she is tired of waiting and wants to leave. Pt assured by this RN the MD will be in shortly. This RN apologetic for the wait and explained the high census of pt's in the ED. MD made aware

## 2019-12-13 NOTE — ED Provider Notes (Signed)
[facility] Emergency Department Provider Note   ____________________________________________   I have reviewed the triage vital signs and the nursing notes.   HISTORY  Chief Complaint Loss of Consciousness   History limited by: Not Limited   HPI Pamela Lane is a 59 y.o. female who presents to the emergency department today because of concerns for a syncopal episode and bad headache.  Patient states that she frequently gets headaches in the mornings.  She thinks this might be due to a combination of cigarette use as well as her medications.  The patient states that she did feel little dizzy this morning.  When she went into the kitchen and she passed out.  She remembers waking up on the floor.  The headache starts in the back of her head and then goes to the front of her head.  She additionally has pain in her right forearm.  She states this pain has been present "for a minute".  Patient denies any recent fevers.  Records reviewed. Per medical record review patient has a history of DM  Past Medical History:  Diagnosis Date  . Asthma   . Clostridium difficile colitis   . Diabetes mellitus without complication (HCC)   . Hypertension   . Mental disorder    pt reports 'I have all of them mental disorders'    Patient Active Problem List   Diagnosis Date Noted  . Tobacco abuse 10/04/2019  . DKA, type 2 (HCC) 10/02/2019  . Essential hypertension 10/02/2019  . Bipolar disorder (HCC) 01/17/2019  . Acute pancreatitis 05/15/2016  . AKI (acute kidney injury) (HCC)   . Diabetic ketoacidosis without coma associated with type 2 diabetes mellitus (HCC)   . Hyperglycemia due to type 2 diabetes mellitus (HCC) 01/13/2016  . UTI (urinary tract infection) 01/13/2016  . DKA (diabetic ketoacidoses) (HCC) 01/08/2016  . Hyponatremia 01/08/2016  . Dehydration 01/08/2016  . Urinary tract infection 01/08/2016  . Upper abdominal pain 01/08/2016  . Nausea & vomiting  01/08/2016    Past Surgical History:  Procedure Laterality Date  . ABDOMINAL HYSTERECTOMY      Prior to Admission medications   Medication Sig Start Date End Date Taking? Authorizing Provider  albuterol (PROVENTIL HFA;VENTOLIN HFA) 108 (90 Base) MCG/ACT inhaler Inhale 2 puffs into the lungs every 6 (six) hours as needed for wheezing or shortness of breath. 02/18/18   Auburn Bilberry, MD  aspirin EC 81 MG tablet Take 81 mg by mouth daily.    [provider]  buPROPion (WELLBUTRIN SR) 150 MG 12 hr tablet Take 150 mg by mouth 2 (two) times daily.    [provider]  diltiazem (CARDIZEM CD) 120 MG 24 hr capsule Take 1 capsule (120 mg total) by mouth 2 (two) times a day. 01/06/19   Ramonita Lab, MD  folic acid (FOLVITE) 1 MG tablet Take 1 tablet (1 mg total) by mouth daily. 03/08/18   Ramonita Lab, MD  gabapentin (NEURONTIN) 300 MG capsule Take 300 mg by mouth at bedtime. 05/11/19   [provider]  glipiZIDE (GLUCOTROL XL) 5 MG 24 hr tablet Take 1 tablet (5 mg total) by mouth daily with breakfast. 01/06/19 01/07/20  Gouru, Deanna Artis, MD  insulin glargine (LANTUS) 100 UNIT/ML Solostar Pen Inject 50 Units into the skin daily. 10/04/19   Dhungel, Nishant, MD  ipratropium-albuterol (DUONEB) 0.5-2.5 (3) MG/3ML SOLN Take 3 mLs by nebulization every 6 (six) hours as needed for wheezing, shortness of breath or cough. 02/23/18   [provider]  lisinopril (PRINIVIL) 10 MG tablet Take 1 tablet (10 mg total) by mouth daily. 02/18/18   Dustin Flock, MD  lurasidone (LATUDA) 40 MG TABS tablet Take 1 tablet (40 mg total) by mouth every evening. 01/18/19   Bettey Costa, MD  Multiple Vitamin (MULTIVITAMIN WITH MINERALS) TABS tablet Take 1 tablet by mouth daily. 03/08/18   Gouru, Illene Silver, MD  NOVOLOG FLEXPEN 100 UNIT/ML FlexPen Inject 12 Units into the skin 3 (three) times daily with meals. 10/04/19   Dhungel, Flonnie Overman, MD  thiamine 100 MG tablet Take 1 tablet (100 mg total) by mouth daily.  03/08/18   Nicholes Mango, MD    Allergies Patient has no known allergies.  Family History  Problem Relation Age of Onset  . Hypertension Mother     Social History Social History   Tobacco Use  . Smoking status: Current Every Day Smoker    Packs/day: 0.25    Types: Cigarettes  . Smokeless tobacco: Never Used  Substance Use Topics  . Alcohol use: Yes    Alcohol/week: 40.0 standard drinks    Types: 40 Cans of beer per week    Comment: 3 big bottles of liquor  . Drug use: Yes    Types: Marijuana, Cocaine    Review of Systems Constitutional: No fever/chills Eyes: No visual changes. ENT: No sore throat. Cardiovascular: Denies chest pain. Respiratory: Denies shortness of breath. Gastrointestinal: No abdominal pain.  No nausea, no vomiting.  No diarrhea.   Genitourinary: Negative for dysuria. Musculoskeletal: Positive for right forearm pain. Skin: Negative for rash. Neurological: Positive for headache. Positive for syncopal episode.  ____________________________________________   PHYSICAL EXAM:  VITAL SIGNS: ED Triage Vitals  Enc Vitals Group     BP 12/13/19 1553 120/76     Pulse Rate 12/13/19 1553 (!) 103     Resp 12/13/19 1553 12     Temp 12/13/19 1553 99.4 F (37.4 C)     Temp Source 12/13/19 1553 Oral     SpO2 12/13/19 1553 98 %     Weight 12/13/19 1554 186 lb (84.4 kg)     Height --      Head Circumference --      Peak Flow --      Pain Score 12/13/19 1554 10     Pain Loc --      Pain Edu? --      Excl. in [place]? --      Constitutional: Alert and oriented.  Eyes: Conjunctivae are normal.  ENT      Head: Normocephalic and atraumatic.      Nose: No congestion/rhinnorhea.      Mouth/Throat: Mucous membranes are moist.      Neck: No stridor. No midline tenderness.  Hematological/Lymphatic/Immunilogical: No cervical lymphadenopathy. Cardiovascular: Normal rate, regular rhythm.  No murmurs, rubs, or gallops.  Respiratory: Normal respiratory effort without  tachypnea nor retractions. Breath sounds are clear and equal bilaterally. No wheezes/rales/rhonchi. Gastrointestinal: Soft and non tender. No rebound. No guarding.  Genitourinary: Deferred Musculoskeletal: Normal range of motion in all extremities. No lower extremity edema. Right forearm without swelling or deformity. Radial pulse 2+ Neurologic:  Normal speech and language. No gross focal neurologic deficits are appreciated.  Skin:  Skin is warm, dry and intact. No rash noted. Psychiatric: Mood and affect are normal. Speech and behavior are normal. Patient exhibits appropriate insight and judgment.  ____________________________________________    LABS (pertinent positives/negatives)  CBC wbc 8.7, hgb 13.8, plt 345 BMP wnl except glu 149,  bun 23 UA hazy, glucose 50, trace leukocytes, 11-20 wbc, rare bacteria ____________________________________________   EKG  I, Phineas Semen, attending physician, personally viewed and interpreted this EKG  EKG Time: 1546 Rate: 99 Rhythm: normal sinus rhythm Axis: normal Intervals: qtc 490 QRS: narrow, q waves v1 ST changes: no st elevation Impression: abnormal ekg   ____________________________________________    RADIOLOGY  None  ____________________________________________   PROCEDURES  Procedures  ____________________________________________   INITIAL IMPRESSION / ASSESSMENT AND PLAN / ED COURSE  Pertinent labs & imaging results that were available during my care of the patient were reviewed by me and considered in my medical decision making (see chart for details).   Patient presented to the emergency department today with primary concerns for syncopal episode as well as headache.  Patient states she gets a headache frequently.  Today it was accompanied by syncopal episode.  Also complained of some right forearm pain.  Patient's blood work without any concerning anemia or electrolyte abnormality.  Given headache I did offer  and recommended head CT.  Patient however declined states she did want to go home.  Given that it sounds like the headache is somewhat chronic in nature do not think it is completely unreasonable to continue work-up as an outpatient.  Did discuss return precautions with patient.  ____________________________________________   FINAL CLINICAL IMPRESSION(S) / ED DIAGNOSES  Final diagnoses:  Syncope, unspecified syncope type  Bad headache     Note: This dictation was prepared with Dragon dictation. Any transcriptional errors that result from this process are unintentional     Phineas Semen, MD 12/13/19 1727

## 2019-12-13 NOTE — Discharge Instructions (Signed)
Please seek medical attention for any high fevers, chest pain, shortness of breath, change in behavior, persistent vomiting, bloody stool or any other new or concerning symptoms.  

## 2019-12-13 NOTE — ED Triage Notes (Signed)
Pt is here due to syncope today. Pt states that she has had a headache for 3 days and today she passed out in her kitchen, she does not know if she hit her head or how long she was out for.  She states that she was found laying on her stomach in her kitchen when her boyfriend came home, no incontinence, does not feel any injury from this.  Pt also reports that she has had intermittent left arm pain and swelling and numbness for a couple of months.  No neuro deficits now, no CP or sob.  Headache is in the back of her head and is "real bad" 10/10

## 2019-12-15 LAB — URINE CULTURE

## 2019-12-25 ENCOUNTER — Emergency Department
Admission: EM | Admit: 2019-12-25 | Discharge: 2019-12-25 | Disposition: A | Discharge status: Home / Self Care | Payer: Medicaid Other | Attending: Emergency Medicine | Admitting: Emergency Medicine

## 2019-12-25 ENCOUNTER — Other Ambulatory Visit: Payer: Self-pay

## 2019-12-25 DIAGNOSIS — Z5321 Procedure and treatment not carried out due to patient leaving prior to being seen by health care provider: Secondary | ICD-10-CM | POA: Insufficient documentation

## 2019-12-25 DIAGNOSIS — E1165 Type 2 diabetes mellitus with hyperglycemia: Secondary | ICD-10-CM | POA: Diagnosis not present

## 2019-12-25 LAB — URINALYSIS, COMPLETE (UACMP) WITH MICROSCOPIC
Bacteria, UA: NONE SEEN
Bilirubin Urine: NEGATIVE
Glucose, UA: >=500 mg/dL — AB
Hgb urine dipstick: NEGATIVE
Ketones, ur: NEGATIVE mg/dL
Leukocytes,Ua: NEGATIVE
Nitrite: NEGATIVE
Protein, ur: NEGATIVE mg/dL
Specific Gravity, Urine: 1.033 — ABNORMAL HIGH (ref 1.005–1.030)
pH: 5 (ref 5.0–8.0)

## 2019-12-25 LAB — CBC
HCT: 39.2 % (ref 36.0–46.0)
Hemoglobin: 13.8 g/dL (ref 12.0–15.0)
MCH: 31.5 pg (ref 26.0–34.0)
MCHC: 35.2 g/dL (ref 30.0–36.0)
MCV: 89.5 fL (ref 80.0–100.0)
Platelets: 284 10*3/uL (ref 150–400)
RBC: 4.38 MIL/uL (ref 3.87–5.11)
RDW: 12 % (ref 11.5–15.5)
WBC: 8.4 10*3/uL (ref 4.0–10.5)
nRBC: 0 % (ref 0.0–0.2)

## 2019-12-25 LAB — BASIC METABOLIC PANEL
Anion gap: 10 (ref 5–15)
BUN: 14 mg/dL (ref 6–20)
CO2: 24 mmol/L (ref 22–32)
Calcium: 9 mg/dL (ref 8.9–10.3)
Chloride: 104 mmol/L (ref 98–111)
Creatinine, Ser: 0.65 mg/dL (ref 0.44–1.00)
GFR calc Af Amer: >60 mL/min (ref >60)
GFR calc non Af Amer: >60 mL/min (ref >60)
Glucose, Bld: 405 mg/dL — ABNORMAL HIGH (ref 70–99)
Potassium: 3.8 mmol/L (ref 3.5–5.1)
Sodium: 138 mmol/L (ref 135–145)

## 2019-12-25 LAB — GLUCOSE, CAPILLARY: Glucose-Capillary: 372 mg/dL — ABNORMAL HIGH (ref 70–99)

## 2019-12-25 NOTE — ED Notes (Signed)
Pt called from [facility] to treatment room, no response 

## 2019-12-25 NOTE — ED Notes (Signed)
Pt called from [facility] to treatment room x's 3, no response 

## 2019-12-25 NOTE — ED Triage Notes (Addendum)
Pt arrives via [facility] from home for reports of hyperglycemia yesterday while at doctor's office. Pt was given fluids at doctors as well as insulin and her sugar continued to read HI. Pt reports this morning her glucose was 446. Pt reports glucose 500 with EMS. PT in NAD, A&Ox4, skin warm and dry. PT reports she has been taking her insulin like normal as well as her oral medications. PT also c/o CP and shob. PT speaking in complete sentences without difficulty

## 2019-12-25 NOTE — ED Triage Notes (Signed)
Pt in via EMS from home with c/o blood sugar of 500. #20 left arm, wrist, of fluid given.

## 2020-03-13 ENCOUNTER — Emergency Department (HOSPITAL_COMMUNITY): Payer: Medicaid Other

## 2020-03-13 ENCOUNTER — Emergency Department
Admission: EM | Admit: 2020-03-13 | Discharge: 2020-03-13 | Discharge status: Against Medical Advice | Payer: Medicaid Other

## 2020-03-13 ENCOUNTER — Other Ambulatory Visit: Payer: Self-pay

## 2020-03-13 ENCOUNTER — Encounter (HOSPITAL_COMMUNITY): Payer: Self-pay | Admitting: Emergency Medicine

## 2020-03-13 ENCOUNTER — Inpatient Hospital Stay (HOSPITAL_COMMUNITY)
Admission: EM | Admit: 2020-03-13 | Discharge: 2020-03-18 | DRG: 638 | Disposition: A | Discharge status: Home / Self Care | Payer: Medicaid Other | Attending: Family Medicine | Admitting: Family Medicine

## 2020-03-13 DIAGNOSIS — N179 Acute kidney failure, unspecified: Secondary | ICD-10-CM | POA: Diagnosis not present

## 2020-03-13 DIAGNOSIS — J9811 Atelectasis: Secondary | ICD-10-CM | POA: Diagnosis present

## 2020-03-13 DIAGNOSIS — Z8249 Family history of ischemic heart disease and other diseases of the circulatory system: Secondary | ICD-10-CM | POA: Diagnosis not present

## 2020-03-13 DIAGNOSIS — F17210 Nicotine dependence, cigarettes, uncomplicated: Secondary | ICD-10-CM | POA: Diagnosis present

## 2020-03-13 DIAGNOSIS — F99 Mental disorder, not otherwise specified: Secondary | ICD-10-CM | POA: Diagnosis present

## 2020-03-13 DIAGNOSIS — T383X6A Underdosing of insulin and oral hypoglycemic [antidiabetic] drugs, initial encounter: Secondary | ICD-10-CM | POA: Diagnosis present

## 2020-03-13 DIAGNOSIS — Z20822 Contact with and (suspected) exposure to covid-19: Secondary | ICD-10-CM | POA: Diagnosis present

## 2020-03-13 DIAGNOSIS — E872 Acidosis: Secondary | ICD-10-CM | POA: Diagnosis not present

## 2020-03-13 DIAGNOSIS — Z91138 Patient's unintentional underdosing of medication regimen for other reason: Secondary | ICD-10-CM

## 2020-03-13 DIAGNOSIS — E1110 Type 2 diabetes mellitus with ketoacidosis without coma: Principal | ICD-10-CM | POA: Diagnosis present

## 2020-03-13 DIAGNOSIS — E876 Hypokalemia: Secondary | ICD-10-CM | POA: Diagnosis not present

## 2020-03-13 DIAGNOSIS — H6692 Otitis media, unspecified, left ear: Secondary | ICD-10-CM | POA: Diagnosis present

## 2020-03-13 DIAGNOSIS — E875 Hyperkalemia: Secondary | ICD-10-CM | POA: Diagnosis present

## 2020-03-13 DIAGNOSIS — E860 Dehydration: Secondary | ICD-10-CM | POA: Diagnosis present

## 2020-03-13 DIAGNOSIS — E0810 Diabetes mellitus due to underlying condition with ketoacidosis without coma: Secondary | ICD-10-CM | POA: Diagnosis not present

## 2020-03-13 DIAGNOSIS — D72829 Elevated white blood cell count, unspecified: Secondary | ICD-10-CM | POA: Diagnosis present

## 2020-03-13 DIAGNOSIS — I10 Essential (primary) hypertension: Secondary | ICD-10-CM | POA: Diagnosis present

## 2020-03-13 DIAGNOSIS — E1310 Other specified diabetes mellitus with ketoacidosis without coma: Secondary | ICD-10-CM

## 2020-03-13 DIAGNOSIS — Z9111 Patient's noncompliance with dietary regimen: Secondary | ICD-10-CM

## 2020-03-13 LAB — CBC
HCT: 54.4 % — ABNORMAL HIGH (ref 36.0–46.0)
Hemoglobin: 17.2 g/dL — ABNORMAL HIGH (ref 12.0–15.0)
MCH: 31 pg (ref 26.0–34.0)
MCHC: 31.6 g/dL (ref 30.0–36.0)
MCV: 98.2 fL (ref 80.0–100.0)
Platelets: 384 10*3/uL (ref 150–400)
RBC: 5.54 MIL/uL — ABNORMAL HIGH (ref 3.87–5.11)
RDW: 12.5 % (ref 11.5–15.5)
WBC: 19.8 10*3/uL — ABNORMAL HIGH (ref 4.0–10.5)
nRBC: 0 % (ref 0.0–0.2)

## 2020-03-13 LAB — BASIC METABOLIC PANEL
BUN: 28 mg/dL — ABNORMAL HIGH (ref 6–20)
CO2: <7 mmol/L — ABNORMAL LOW (ref 22–32)
Calcium: 9.8 mg/dL (ref 8.9–10.3)
Chloride: 94 mmol/L — ABNORMAL LOW (ref 98–111)
Creatinine, Ser: 1.79 mg/dL — ABNORMAL HIGH (ref 0.44–1.00)
GFR calc Af Amer: 36 mL/min — ABNORMAL LOW (ref >60)
GFR calc non Af Amer: 31 mL/min — ABNORMAL LOW (ref >60)
Glucose, Bld: 691 mg/dL (ref 70–99)
Potassium: 6.4 mmol/L (ref 3.5–5.1)
Sodium: 128 mmol/L — ABNORMAL LOW (ref 135–145)

## 2020-03-13 LAB — BLOOD GAS, ARTERIAL
Bicarbonate: 2.8 mmol/L — ABNORMAL LOW (ref 20.0–28.0)
Drawn by: 560031
O2 Content: 2 L/min
O2 Saturation: 96.1 %
Patient temperature: 97.7
pCO2 arterial: 12.2 mmHg — CL (ref 32.0–48.0)
pH, Arterial: 6.988 — CL (ref 7.350–7.450)
pO2, Arterial: 117 mmHg — ABNORMAL HIGH (ref 83.0–108.0)

## 2020-03-13 LAB — HEPATIC FUNCTION PANEL
ALT: 15 U/L (ref 0–44)
AST: 13 U/L — ABNORMAL LOW (ref 15–41)
Albumin: 4.5 g/dL (ref 3.5–5.0)
Alkaline Phosphatase: 117 U/L (ref 38–126)
Bilirubin, Direct: <0.1 mg/dL (ref 0.0–0.2)
Total Bilirubin: 1.4 mg/dL — ABNORMAL HIGH (ref 0.3–1.2)
Total Protein: 8.3 g/dL — ABNORMAL HIGH (ref 6.5–8.1)

## 2020-03-13 LAB — CBG MONITORING, ED
Glucose-Capillary: >600 mg/dL (ref 70–99)
Glucose-Capillary: >600 mg/dL (ref 70–99)
Glucose-Capillary: >600 mg/dL (ref 70–99)
Glucose-Capillary: >600 mg/dL (ref 70–99)
Glucose-Capillary: 595 mg/dL (ref 70–99)
Glucose-Capillary: 339 mg/dL — ABNORMAL HIGH (ref 70–99)
Glucose-Capillary: 513 mg/dL (ref 70–99)

## 2020-03-13 LAB — URINALYSIS, ROUTINE W REFLEX MICROSCOPIC
Bacteria, UA: NONE SEEN
Bilirubin Urine: NEGATIVE
Glucose, UA: >=500 mg/dL — AB
Ketones, ur: 80 mg/dL — AB
Leukocytes,Ua: NEGATIVE
Nitrite: NEGATIVE
Protein, ur: 100 mg/dL — AB
Specific Gravity, Urine: 1.016 (ref 1.005–1.030)
pH: 5 (ref 5.0–8.0)

## 2020-03-13 LAB — GLUCOSE, CAPILLARY: Glucose-Capillary: 286 mg/dL — ABNORMAL HIGH (ref 70–99)

## 2020-03-13 LAB — LACTIC ACID, PLASMA
Lactic Acid, Venous: 4.2 mmol/L (ref 0.5–1.9)
Lactic Acid, Venous: 3.4 mmol/L (ref 0.5–1.9)

## 2020-03-13 LAB — SARS CORONAVIRUS 2 BY RT PCR (HOSPITAL ORDER, PERFORMED IN ~~LOC~~ HOSPITAL LAB): SARS Coronavirus 2: NEGATIVE

## 2020-03-13 LAB — TROPONIN I (HIGH SENSITIVITY): Troponin I (High Sensitivity): 8 ng/L (ref <18)

## 2020-03-13 LAB — BETA-HYDROXYBUTYRIC ACID: Beta-Hydroxybutyric Acid: >8 mmol/L — ABNORMAL HIGH (ref 0.05–0.27)

## 2020-03-13 MED ORDER — DOCUSATE SODIUM 100 MG PO CAPS: 100.0000 mg | ORAL_CAPSULE | Freq: Two times a day (BID) | ORAL | Status: DC | PRN

## 2020-03-13 MED ORDER — SODIUM CHLORIDE 0.9 % IV SOLN: INTRAVENOUS | Status: DC

## 2020-03-13 MED ORDER — SODIUM CHLORIDE 0.9 % IV BOLUS
1000.0000 mL | Freq: Once | INTRAVENOUS | Status: AC
  Administered 2020-03-13: 1000 mL via INTRAVENOUS

## 2020-03-13 MED ORDER — DEXTROSE 50 % IV SOLN: 0.0000 mL | INTRAVENOUS | Status: DC | PRN

## 2020-03-13 MED ORDER — SODIUM CHLORIDE 0.9 % IV SOLN
2.0000 g | Freq: Once | INTRAVENOUS | Status: AC
  Administered 2020-03-13: 2 g via INTRAVENOUS
  Filled 2020-03-13: qty 2

## 2020-03-13 MED ORDER — LACTATED RINGERS IV SOLN: INTRAVENOUS | Status: DC

## 2020-03-13 MED ORDER — POLYETHYLENE GLYCOL 3350 17 G PO PACK: 17.0000 g | PACK | Freq: Every day | ORAL | Status: DC | PRN

## 2020-03-13 MED ORDER — QUETIAPINE FUMARATE 25 MG PO TABS
25.0000 mg | ORAL_TABLET | Freq: Every day | ORAL | Status: DC
  Administered 2020-03-14 – 2020-03-17 (×4): 25 mg via ORAL
  Filled 2020-03-13 (×4): qty 1

## 2020-03-13 MED ORDER — METRONIDAZOLE IN NACL 5-0.79 MG/ML-% IV SOLN
500.0000 mg | Freq: Once | INTRAVENOUS | Status: DC
  Filled 2020-03-13: qty 100

## 2020-03-13 MED ORDER — DEXTROSE IN LACTATED RINGERS 5 % IV SOLN: INTRAVENOUS | Status: DC

## 2020-03-13 MED ORDER — PANTOPRAZOLE SODIUM 40 MG PO TBEC
40.0000 mg | DELAYED_RELEASE_TABLET | Freq: Every day | ORAL | Status: DC
  Administered 2020-03-14 – 2020-03-18 (×5): 40 mg via ORAL
  Filled 2020-03-13 (×5): qty 1

## 2020-03-13 MED ORDER — VANCOMYCIN HCL IN DEXTROSE 1-5 GM/200ML-% IV SOLN: 1000.0000 mg | Freq: Once | INTRAVENOUS | Status: DC

## 2020-03-13 MED ORDER — SODIUM CHLORIDE 0.9 % IV BOLUS
500.0000 mL | Freq: Once | INTRAVENOUS | Status: AC
  Administered 2020-03-13: 500 mL via INTRAVENOUS

## 2020-03-13 MED ORDER — HEPARIN SODIUM (PORCINE) 5000 UNIT/ML IJ SOLN
5000.0000 [IU] | Freq: Three times a day (TID) | INTRAMUSCULAR | Status: DC
  Administered 2020-03-14 – 2020-03-18 (×14): 5000 [IU] via SUBCUTANEOUS
  Filled 2020-03-13 (×14): qty 1

## 2020-03-13 MED ORDER — SODIUM BICARBONATE 8.4 % IV SOLN
25.0000 meq | Freq: Once | INTRAVENOUS | Status: AC
  Administered 2020-03-14: 25 meq via INTRAVENOUS
  Filled 2020-03-13: qty 50

## 2020-03-13 MED ORDER — INSULIN REGULAR(HUMAN) IN NACL 100-0.9 UT/100ML-% IV SOLN: INTRAVENOUS | Status: DC

## 2020-03-13 MED ORDER — INSULIN REGULAR(HUMAN) IN NACL 100-0.9 UT/100ML-% IV SOLN
INTRAVENOUS | Status: DC
  Administered 2020-03-13: 13 [IU]/h via INTRAVENOUS
  Administered 2020-03-14: 4.4 [IU]/h via INTRAVENOUS
  Filled 2020-03-13 (×2): qty 100

## 2020-03-13 MED ORDER — LACTATED RINGERS IV BOLUS
1700.0000 mL | Freq: Once | INTRAVENOUS | Status: AC
  Administered 2020-03-13: 1700 mL via INTRAVENOUS

## 2020-03-13 NOTE — ED Notes (Addendum)
Pt brought to lobby via w/c by EMS; pt is cursing and yelling loudly about wait time; pt is now on phone cont to curse and yell; attempted to approach pt and pt swings body & arms around wildly at this nurse, yelling and cursing at this nurse; pt is calling this nurse a bitch and calling staff at desk "white crackers"; pt is using derogatory language regarding staff and hospital here and st she is calling someone to come get her and take her to "[facility]"; security over to pt and pt picks up her w/c and throws it in lobby; pt then leaves lobby

## 2020-03-13 NOTE — H&P (Signed)
NAME:  Pamela Lane, MRN:  [id number], DOB:  1960-09-05, LOS: 0 ADMISSION DATE:  03/13/2020, CONSULTATION DATE: 03/13/2020 REFERRING MD: Dr. Rosalia Hammers, CHIEF COMPLAINT: Shortness of breath fatigue  Brief History   This is a 59 year old female past medical history of type, psychiatric disease, type 2 diabetes.  Presents to the ER with hyperglycemia, acidosis consistent with diabetic ketoacidosis.  Pulmonary critical care consulted for ICU admission and management.  Initial pH 6.9, glucose greater than 600.  History of present illness   This is a 59 year old female past medical history of psychiatric disease, type 2 diabetes, noncompliant with insulin.  Patient states that she has been feeling bad for the past couple of days.  She does not routinely take her insulin.  She states that it does not work.  In the emergency room she was found to be severely acidotic with a pH of 6.9 glucose greater than 600.  Serum bicarb of 12.  Patient was diagnosed with DKA.  Additionally was found to have hyperkalemia, with a potassium of 6.4, lactic acidosis of 4.2.  Serum creatinine of 1.79 up from patient's baseline a few months ago of 0.65.  Patient was given 1.7 L of lactated Ringer's via bolus as well as a lactated Ringer's infusion at 125 mL's per hour.  Patient was given 1 amp of sodium bicarbonate.  Past Medical History   Past Medical History:  Diagnosis Date  . Asthma   . Clostridium difficile colitis   . Diabetes mellitus without complication (HCC)   . Hypertension   . Mental disorder    pt reports 'I have all of them mental disorders'     Significant Hospital Events   ICU admission  Consults:  PCCM  Procedures:  None  Significant Diagnostic Tests:  Chest x-ray: Right basilar atelectasis no infiltrate, The patient's images have been independently reviewed by me.    Micro Data:  Blood cultures: Pending Urine cultures: Pending  Antimicrobials:  Cefepime Metronidazole Vancomycin  Interim  history/subjective:  Per HPI above  Objective   Blood pressure (!) 156/85, pulse (!) 127, temperature 97.7 F (36.5 C), temperature source Oral, resp. rate (!) 34, SpO2 (!) 89 %.       No intake or output data in the 24 hours ending 03/13/20 2028 There were no vitals filed for this visit.  Examination: General: Female, chronically ill-appearing, tachypneic, labored breathing HENT: NCAT, tracking appropriately, poor dentition Lungs: Clear to auscultation bilaterally no crackles no wheeze Cardiovascular: Tachycardic, regular, S1-S2 Abdomen: Abdomen soft nontender nondistended Extremities: No significant edema Neuro: Alert oriented following commands, moves all 4 GU: Deferred  Resolved Hospital Problem list     Assessment & Plan:   Severe diabetic ketoacidosis Anion gap metabolic acidosis Lactic acidosis AKI, likely prerenal Pseudohyponatremia secondary to hyperglycemia Type 2 diabetes, uncontrolled, with hyperglycemia Possible sepsis Leukocytosis, reactive Elevated hemoglobin, intravascular volume depletion, dehydration Plan: Endo tool, DKA protocol Switch lactated Ringer's to sodium chloride due to hyperkalemia. Patient has been given amp of sodium bicarbonate. Insulin infusion has been started. Suspect hyperkalemia will improve.  Especially as acidosis resolves. Repeat BMP every 4 hours.  Repeat ABG in the a.m. Follow urine output. Patient started on broad-spectrum antimicrobials in the emergency department. Continue for now. Follow-up culture results. I doubt infection as patient has clear history of noncompliance with insulin use.  Hypertension Plan: Hold home dose lisinopril.  Baseline psychiatric disorder, not otherwise specified Plan: Restart partial dose of home Seroquel.  Best practice:  Diet: N.p.o. Pain/Anxiety/Delirium protocol (  if indicated): Not applicable VAP protocol (if indicated): Not applicable DVT prophylaxis: Heparin GI prophylaxis:  PPI Glucose control: Endo tool Mobility: Bedrest Code Status: Full Family Communication: patient Disposition: ICU  Labs   CBC: Recent Labs  Lab 03/13/20 1418  WBC 19.8*  HGB 17.2*  HCT 54.4*  MCV 98.2  PLT 384    Basic Metabolic Panel: Recent Labs  Lab 03/13/20 1418  NA 128*  K 6.4*  CL 94*  CO2 <7*  GLUCOSE 691*  BUN 28*  CREATININE 1.79*  CALCIUM 9.8   GFR: CrCl cannot be calculated (Unknown ideal weight.). Recent Labs  Lab 03/13/20 1418 03/13/20 1650  WBC 19.8*  --   LATICACIDVEN  --  4.2*    Liver Function Tests: No results for input(s): AST, ALT, ALKPHOS, BILITOT, PROT, ALBUMIN in the last 168 hours. No results for input(s): LIPASE, AMYLASE in the last 168 hours. No results for input(s): AMMONIA in the last 168 hours.  ABG    Component Value Date/Time   PHART 6.988 (LL) 03/13/2020 1645   PCO2ART 12.2 (LL) 03/13/2020 1645   PO2ART 117 (H) 03/13/2020 1645   HCO3 2.8 (L) 03/13/2020 1645   TCO2 15 01/12/2016 2315   ACIDBASEDEF 16.8 (H) 10/02/2019 1457   O2SAT 96.1 03/13/2020 1645     Coagulation Profile: No results for input(s): INR, PROTIME in the last 168 hours.  Cardiac Enzymes: No results for input(s): CKTOTAL, CKMB, CKMBINDEX, TROPONINI in the last 168 hours.  HbA1C: Hgb A1c MFr Bld  Date/Time Value Ref Range Status  10/02/2019 05:30 PM >15.5 (H) 4.8 - 5.6 % Final    Comment:    (NOTE) **Verified by repeat analysis**         Prediabetes: 5.7 - 6.4         Diabetes: >6.4         Glycemic control for adults with diabetes: <7.0   04/02/2019 04:56 AM 13.3 (H) 4.8 - 5.6 % Final    Comment:    (NOTE) Pre diabetes:          5.7%-6.4% Diabetes:              >6.4% Glycemic control for   <7.0% adults with diabetes     CBG: Recent Labs  Lab 03/13/20 1410 03/13/20 1711 03/13/20 1848  GLUCAP >600* >600* >600*    Review of Systems:   Review of Systems  Constitutional: Negative for chills, fever, malaise/fatigue and weight loss.   HENT: Negative for hearing loss, sore throat and tinnitus.   Eyes: Negative for blurred vision and double vision.  Respiratory: Positive for cough and shortness of breath. Negative for hemoptysis, sputum production, wheezing and stridor.   Cardiovascular: Positive for palpitations. Negative for chest pain, orthopnea, leg swelling and PND.  Gastrointestinal: Positive for abdominal pain. Negative for constipation, diarrhea, heartburn, nausea and vomiting.  Genitourinary: Negative for dysuria, hematuria and urgency.  Musculoskeletal: Negative for joint pain and myalgias.  Skin: Negative for itching and rash.  Neurological: Negative for dizziness, tingling, weakness and headaches.  Endo/Heme/Allergies: Negative for environmental allergies. Does not bruise/bleed easily.  Psychiatric/Behavioral: Negative for depression. The patient is not nervous/anxious and does not have insomnia.   All other systems reviewed and are negative.    Past Medical History  She,  has a past medical history of Asthma, Clostridium difficile colitis, Diabetes mellitus without complication (HCC), Hypertension, and Mental disorder.   Surgical History    Past Surgical History:  Procedure Laterality Date  . ABDOMINAL  HYSTERECTOMY       Social History   reports that she has been smoking cigarettes. She has been smoking about 0.25 packs per day. She has never used smokeless tobacco. She reports current alcohol use of about 40.0 standard drinks of alcohol per week. She reports current drug use. Drugs: Marijuana and Cocaine.   Family History   Her family history includes Hypertension in her mother.   Allergies No Known Allergies   Home Medications  Prior to Admission medications   Medication Sig Start Date End Date Taking? Authorizing Provider  albuterol (PROVENTIL HFA;VENTOLIN HFA) 108 (90 Base) MCG/ACT inhaler Inhale 2 puffs into the lungs every 6 (six) hours as needed for wheezing or shortness of breath. 02/18/18    Auburn BilberryPatel, Shreyang, MD  aspirin EC 81 MG tablet Take 81 mg by mouth daily.    [provider]  atorvastatin (LIPITOR) 40 MG tablet Take 40 mg by mouth at bedtime. 12/25/19   [provider]  buPROPion (WELLBUTRIN SR) 150 MG 12 hr tablet Take 150 mg by mouth 2 (two) times daily.    [provider]  buPROPion (WELLBUTRIN XL) 150 MG 24 hr tablet  10/23/19   [provider]  cetirizine (ZYRTEC) 10 MG tablet Take 10 mg by mouth daily. 02/09/20   [provider]  diltiazem (CARDIZEM CD) 120 MG 24 hr capsule Take 1 capsule (120 mg total) by mouth 2 (two) times a day. 01/06/19   Ramonita LabGouru, Aruna, MD  diltiazem (CARDIZEM SR) 120 MG 12 hr capsule  10/23/19   [provider]  folic acid (FOLVITE) 1 MG tablet Take 1 tablet (1 mg total) by mouth daily. 03/08/18   Ramonita LabGouru, Aruna, MD  gabapentin (NEURONTIN) 300 MG capsule Take 300 mg by mouth at bedtime. 05/11/19   [provider]  glipiZIDE (GLUCOTROL XL) 5 MG 24 hr tablet Take 1 tablet (5 mg total) by mouth daily with breakfast. 01/06/19 01/07/20  Gouru, Deanna ArtisAruna, MD  insulin glargine (LANTUS) 100 UNIT/ML Solostar Pen Inject 50 Units into the skin daily. 10/04/19   Dhungel, Nishant, MD  ipratropium-albuterol (DUONEB) 0.5-2.5 (3) MG/3ML SOLN Take 3 mLs by nebulization every 6 (six) hours as needed for wheezing, shortness of breath or cough. 02/23/18   [provider]  lisinopril (PRINIVIL) 10 MG tablet Take 1 tablet (10 mg total) by mouth daily. 02/18/18   Auburn BilberryPatel, Shreyang, MD  lurasidone (LATUDA) 40 MG TABS tablet Take 1 tablet (40 mg total) by mouth every evening. 01/18/19   Adrian SaranMody, Sital, MD  metFORMIN (GLUCOPHAGE-XR) 500 MG 24 hr tablet Take 500 mg by mouth 2 (two) times daily. 12/09/19   [provider]  Multiple Vitamin (MULTIVITAMIN WITH MINERALS) TABS tablet Take 1 tablet by mouth daily. 03/08/18   Gouru, Deanna ArtisAruna, MD  NOVOLOG FLEXPEN 100 UNIT/ML FlexPen Inject 12 Units into the skin 3 (three) times daily  with meals. 10/04/19   Dhungel, Theda BelfastNishant, MD  QUEtiapine (SEROQUEL) 100 MG tablet  10/23/19   [provider]  thiamine 100 MG tablet Take 1 tablet (100 mg total) by mouth daily. 03/08/18   Ramonita LabGouru, Aruna, MD     This patient is critically ill with multiple organ system failure; which, requires frequent high complexity decision making, assessment, support, evaluation, and titration of therapies. This was completed through the application of advanced monitoring technologies and extensive interpretation of multiple databases. During this encounter critical care time was devoted to patient care services described in this note for 43 minutes.  Rachel BoBradley L  Meiling Hendriks, DO [facility] 03/13/2020 8:28 PM

## 2020-03-13 NOTE — ED Notes (Signed)
Patient was angry that she could not have anything to eat or drink and was yelling and cursing at staff. NT spoke to patient to explain why she had to wait and to try to help lower patient's anxiety, but patient continued to yell and swear. When asked if she needed anything in the meantime, patient said she needed the restroom. NT offered to assist patient to restroom, however patient refused and urinated on the floor. NT assisted patient back to bed and with hygiene. RN made aware.

## 2020-03-13 NOTE — ED Notes (Signed)
Pt is now on phone telling someone to come & pick her up

## 2020-03-13 NOTE — ED Notes (Signed)
Pt is now walking around lobby with her cellphone out video taping staff and pt in the lobby; [place] [employer] called for assistance

## 2020-03-13 NOTE — ED Provider Notes (Addendum)
[facility]-EMERGENCY DEPT Provider Note   CSN: [id number] Arrival date & time: 03/13/20  1348     History Chief Complaint  Patient presents with  . Hyperglycemia    Pamela Lane is a 59 y.o. female.  HPI Level 5 caveat secondary to psychiatric disorder    59 yo female presents complaining of dyspnea.  Patient is very difficulty to obtain history from, she intermittently yells and is unclear about symptoms and onset.  I reviewed nurses' notes and previous charts.    Past Medical History:  Diagnosis Date  . Asthma   . Clostridium difficile colitis   . Diabetes mellitus without complication (HCC)   . Hypertension   . Mental disorder    pt reports 'I have all of them mental disorders'    Patient Active Problem List   Diagnosis Date Noted  . Tobacco abuse 10/04/2019  . DKA, type 2 (HCC) 10/02/2019  . Essential hypertension 10/02/2019  . Bipolar disorder (HCC) 01/17/2019  . Acute pancreatitis 05/15/2016  . AKI (acute kidney injury) (HCC)   . Diabetic ketoacidosis without coma associated with type 2 diabetes mellitus (HCC)   . Hyperglycemia due to type 2 diabetes mellitus (HCC) 01/13/2016  . UTI (urinary tract infection) 01/13/2016  . DKA (diabetic ketoacidoses) (HCC) 01/08/2016  . Hyponatremia 01/08/2016  . Dehydration 01/08/2016  . Urinary tract infection 01/08/2016  . Upper abdominal pain 01/08/2016  . Nausea & vomiting 01/08/2016    Past Surgical History:  Procedure Laterality Date  . ABDOMINAL HYSTERECTOMY       OB History   No obstetric history on file.     Family History  Problem Relation Age of Onset  . Hypertension Mother     Social History   Tobacco Use  . Smoking status: Current Every Day Smoker    Packs/day: 0.25    Types: Cigarettes  . Smokeless tobacco: Never Used  Vaping Use  . Vaping Use: Never used  Substance Use Topics  . Alcohol use: Yes    Alcohol/week: 40.0 standard drinks    Types: 40 Cans of beer  per week    Comment: 3 big bottles of liquor  . Drug use: Yes    Types: Marijuana, Cocaine    Home Medications Prior to Admission medications   Medication Sig Start Date End Date Taking? Authorizing Provider  albuterol (PROVENTIL HFA;VENTOLIN HFA) 108 (90 Base) MCG/ACT inhaler Inhale 2 puffs into the lungs every 6 (six) hours as needed for wheezing or shortness of breath. 02/18/18   Auburn Bilberry, MD  aspirin EC 81 MG tablet Take 81 mg by mouth daily.    [provider]  buPROPion (WELLBUTRIN SR) 150 MG 12 hr tablet Take 150 mg by mouth 2 (two) times daily.    [provider]  diltiazem (CARDIZEM CD) 120 MG 24 hr capsule Take 1 capsule (120 mg total) by mouth 2 (two) times a day. 01/06/19   Ramonita Lab, MD  folic acid (FOLVITE) 1 MG tablet Take 1 tablet (1 mg total) by mouth daily. 03/08/18   Ramonita Lab, MD  gabapentin (NEURONTIN) 300 MG capsule Take 300 mg by mouth at bedtime. 05/11/19   [provider]  glipiZIDE (GLUCOTROL XL) 5 MG 24 hr tablet Take 1 tablet (5 mg total) by mouth daily with breakfast. 01/06/19 01/07/20  Gouru, Deanna Artis, MD  insulin glargine (LANTUS) 100 UNIT/ML Solostar Pen Inject 50 Units into the skin daily. 10/04/19   Dhungel, Theda Belfast, MD  ipratropium-albuterol (DUONEB) 0.5-2.5 (  3) MG/3ML SOLN Take 3 mLs by nebulization every 6 (six) hours as needed for wheezing, shortness of breath or cough. 02/23/18   [provider]  lisinopril (PRINIVIL) 10 MG tablet Take 1 tablet (10 mg total) by mouth daily. 02/18/18   Auburn Bilberry, MD  lurasidone (LATUDA) 40 MG TABS tablet Take 1 tablet (40 mg total) by mouth every evening. 01/18/19   Adrian Saran, MD  Multiple Vitamin (MULTIVITAMIN WITH MINERALS) TABS tablet Take 1 tablet by mouth daily. 03/08/18   Gouru, Deanna Artis, MD  NOVOLOG FLEXPEN 100 UNIT/ML FlexPen Inject 12 Units into the skin 3 (three) times daily with meals. 10/04/19   Dhungel, Theda Belfast, MD  thiamine 100 MG tablet Take 1 tablet (100 mg total) by  mouth daily. 03/08/18   Ramonita Lab, MD    Allergies    Patient has no known allergies.  Review of Systems   Review of Systems  Unable to perform ROS: Psychiatric disorder    Physical Exam Updated Vital Signs BP (!) 156/85 (BP Location: Left Arm)   Pulse (!) 115   Temp 97.7 F (36.5 C) (Oral)   Resp 20   SpO2 100%   Physical Exam Vitals and nursing note reviewed. Exam conducted with a chaperone present.  Constitutional:      General: She is in acute distress.     Appearance: Normal appearance.  HENT:     Head: Normocephalic.     Right Ear: External ear normal.     Left Ear: External ear normal.     Nose: Nose normal.     Mouth/Throat:     Pharynx: Oropharynx is clear.  Cardiovascular:     Rate and Rhythm: Tachycardia present.     Pulses: Normal pulses.  Pulmonary:     Effort: Respiratory distress present.     Breath sounds: Rales present.     Comments: Increased wob and tachypnea present Abdominal:     General: Bowel sounds are normal.     Palpations: Abdomen is soft.  Musculoskeletal:        General: No swelling. Normal range of motion.     Cervical back: Normal range of motion.  Skin:    General: Skin is warm and dry.     Capillary Refill: Capillary refill takes less than 2 seconds.  Neurological:     General: No focal deficit present.     Mental Status: She is alert.     Cranial Nerves: No cranial nerve deficit.     Motor: No weakness.     Coordination: Coordination normal.  Psychiatric:        Attention and Perception: She is inattentive.        Mood and Affect: Mood is anxious. Affect is labile, angry and inappropriate.        Speech: Speech is tangential.        Behavior: Behavior is agitated and aggressive.        Thought Content: Thought content is paranoid. Thought content is not delusional. Thought content does not include homicidal or suicidal ideation. Thought content does not include homicidal or suicidal plan.        Judgment: Judgment is  impulsive and inappropriate.     ED Results / Procedures / Treatments   Labs (all labs ordered are listed, but only abnormal results are displayed) Labs Reviewed  CBC - Abnormal; Notable for the following components:      Result Value   WBC 19.8 (*)    RBC 5.54 (*)  Hemoglobin 17.2 (*)    HCT 54.4 (*)    All other components within normal limits  CBG MONITORING, ED - Abnormal; Notable for the following components:   Glucose-Capillary >600 (*)    All other components within normal limits  SARS CORONAVIRUS 2 BY RT PCR (HOSPITAL ORDER, PERFORMED IN [facility])  BASIC METABOLIC PANEL  URINALYSIS, ROUTINE W REFLEX MICROSCOPIC  HEPATIC FUNCTION PANEL  BLOOD GAS, ARTERIAL  CBG MONITORING, ED  TROPONIN I (HIGH SENSITIVITY)    EKG EKG Interpretation  Date/Time:  Thursday March 13 2020 16:24:24 EDT Ventricular Rate:  115 PR Interval:    QRS Duration: 75 QT Interval:  341 QTC Calculation: 472 R Axis:   76 Text Interpretation: Sinus tachycardia Poor data quality in current ECG precludes serial comparison Confirmed by Margarita Grizzle [phone]) on 03/13/2020 4:40:19 PM   Radiology No results found.  Procedures Procedures (including critical care time)  Medications Ordered in ED Medications  sodium chloride 0.9 % bolus 1,000 mL (has no administration in time range)    ED Course  I have reviewed the triage vital signs and the nursing notes.  Pertinent labs & imaging results that were available during my care of the patient were reviewed by me and considered in my medical decision making (see chart for details).  Clinical Course as of Mar 14 1943  Thu Mar 13, 2020  1756 Abg, glucose, cbc, electrolytes reviewed. Insulin tool started Ekg and cxr reviewed    [DR]  1928 Patient continues tachycardiac, tachypneic-awaiting covid and urine and repeat bmet   [DR]    Clinical Course User Index [DR] Margarita Grizzle, MD   MDM Rules/Calculators/A&P                           Reevaluated patient. Requesting water and allowed to drink plain water Continues tachycardia and tachypneic First liter infusing ABG with ph 6.9, co2>7 Creatinine elevated, leukocytosis, cxr with some inflitrates Patient is hypothermic and considering infection in ddx will add broad spectrum abx Patient presents today with "feeling bad" Patient tachycardiac, tachypneic and now with low sats and evaluation c.w. dka IV fluids ensuing ENdo tool for insulin DDX of dka in 59 yo female: Infection Patient vaccinated against covid Mi noncompliance with meds  7:47 PM covid negative per lab report 8:16 PM Discussed with Dr. Dellie Catholic and critical care will see Bicarb 25 meq ordered Awaiting repeat bmet  1-DKA- patient given fluids and on endotool 2- hyperkalemia- fluids and insulin.  EKG tachy but no definite peaked t waves 3- ?infection- broad spectrum abx given, covid negative  Final Clinical Impression(s) / ED Diagnoses Final diagnoses:  Diabetic ketoacidosis without coma associated with other specified diabetes mellitus [facility])    Rx / DC Orders ED Discharge Orders    None       Margarita Grizzle, MD 03/14/20 0005    Margarita Grizzle, MD 03/14/20 0005

## 2020-03-13 NOTE — ED Notes (Addendum)
Pt begins cursing again loudly, calling this nurse a liar over wait time; security officer attempted also to explain wait time and pt yells "that's bullshit!"

## 2020-03-13 NOTE — ED Notes (Signed)
Date and time results received: 03/13/20 1954 (use smartphrase ".now" to insert current time)  Test: Potassium Critical Value: 2.4  Name of Provider Notified: Dr.Ray  Orders Received? Or Actions Taken?:

## 2020-03-13 NOTE — ED Notes (Addendum)
Pt returns to lobby, making crying noises and to apologizing for her actions; now sitting in w/c

## 2020-03-13 NOTE — ED Triage Notes (Signed)
Per pt, states her blood sugar is running high-states she did not take her insulin because"it doesn't work"-patient is agitated and using profanity-patient requesting water.Marland KitchenMarland Kitchen

## 2020-03-13 NOTE — Progress Notes (Signed)
RT sent ABG sample to lab at 1647, lab notified.

## 2020-03-14 DIAGNOSIS — N179 Acute kidney failure, unspecified: Secondary | ICD-10-CM

## 2020-03-14 LAB — GLUCOSE, CAPILLARY
Glucose-Capillary: 103 mg/dL — ABNORMAL HIGH (ref 70–99)
Glucose-Capillary: 126 mg/dL — ABNORMAL HIGH (ref 70–99)
Glucose-Capillary: 175 mg/dL — ABNORMAL HIGH (ref 70–99)
Glucose-Capillary: 184 mg/dL — ABNORMAL HIGH (ref 70–99)
Glucose-Capillary: 187 mg/dL — ABNORMAL HIGH (ref 70–99)
Glucose-Capillary: 195 mg/dL — ABNORMAL HIGH (ref 70–99)
Glucose-Capillary: 195 mg/dL — ABNORMAL HIGH (ref 70–99)
Glucose-Capillary: 204 mg/dL — ABNORMAL HIGH (ref 70–99)
Glucose-Capillary: 219 mg/dL — ABNORMAL HIGH (ref 70–99)
Glucose-Capillary: 222 mg/dL — ABNORMAL HIGH (ref 70–99)
Glucose-Capillary: 230 mg/dL — ABNORMAL HIGH (ref 70–99)
Glucose-Capillary: 247 mg/dL — ABNORMAL HIGH (ref 70–99)
Glucose-Capillary: 249 mg/dL — ABNORMAL HIGH (ref 70–99)
Glucose-Capillary: 325 mg/dL — ABNORMAL HIGH (ref 70–99)

## 2020-03-14 LAB — CBC WITH DIFFERENTIAL/PLATELET
Basophils Absolute: 0 10*3/uL (ref 0.0–0.1)
Basophils Relative: 0 %
Eosinophils Absolute: 0 10*3/uL (ref 0.0–0.5)
Eosinophils Relative: 0 %
HCT: 47.1 % — ABNORMAL HIGH (ref 36.0–46.0)
Hemoglobin: 15.6 g/dL — ABNORMAL HIGH (ref 12.0–15.0)
Lymphocytes Relative: 6 %
Lymphs Abs: 1.6 10*3/uL (ref 0.7–4.0)
MCH: 31.3 pg (ref 26.0–34.0)
MCHC: 33.1 g/dL (ref 30.0–36.0)
MCV: 94.6 fL (ref 80.0–100.0)
Monocytes Absolute: 1.8 10*3/uL — ABNORMAL HIGH (ref 0.1–1.0)
Monocytes Relative: 6 %
Neutro Abs: 22.9 10*3/uL — ABNORMAL HIGH (ref 1.7–7.7)
Neutrophils Relative %: 85 %
Platelets: 347 10*3/uL (ref 150–400)
RBC: 4.98 MIL/uL (ref 3.87–5.11)
RDW: 12.4 % (ref 11.5–15.5)
WBC: 26.9 10*3/uL — ABNORMAL HIGH (ref 4.0–10.5)
nRBC: 0 % (ref 0.0–0.2)
nRBC: 0 /100 WBC

## 2020-03-14 LAB — BASIC METABOLIC PANEL
Anion gap: 12 (ref 5–15)
Anion gap: 11 (ref 5–15)
Anion gap: 11 (ref 5–15)
BUN: 27 mg/dL — ABNORMAL HIGH (ref 6–20)
BUN: 27 mg/dL — ABNORMAL HIGH (ref 6–20)
BUN: 23 mg/dL — ABNORMAL HIGH (ref 6–20)
CO2: 12 mmol/L — ABNORMAL LOW (ref 22–32)
CO2: 15 mmol/L — ABNORMAL LOW (ref 22–32)
CO2: 15 mmol/L — ABNORMAL LOW (ref 22–32)
Calcium: 8.5 mg/dL — ABNORMAL LOW (ref 8.9–10.3)
Calcium: 9.1 mg/dL (ref 8.9–10.3)
Calcium: 8.9 mg/dL (ref 8.9–10.3)
Chloride: 109 mmol/L (ref 98–111)
Chloride: 105 mmol/L (ref 98–111)
Chloride: 109 mmol/L (ref 98–111)
Creatinine, Ser: 1.11 mg/dL — ABNORMAL HIGH (ref 0.44–1.00)
Creatinine, Ser: 0.97 mg/dL (ref 0.44–1.00)
Creatinine, Ser: 0.94 mg/dL (ref 0.44–1.00)
GFR calc Af Amer: >60 mL/min (ref >60)
GFR calc Af Amer: >60 mL/min (ref >60)
GFR calc Af Amer: >60 mL/min (ref >60)
GFR calc non Af Amer: 55 mL/min — ABNORMAL LOW (ref >60)
GFR calc non Af Amer: >60 mL/min (ref >60)
GFR calc non Af Amer: >60 mL/min (ref >60)
Glucose, Bld: 219 mg/dL — ABNORMAL HIGH (ref 70–99)
Glucose, Bld: 184 mg/dL — ABNORMAL HIGH (ref 70–99)
Glucose, Bld: 100 mg/dL — ABNORMAL HIGH (ref 70–99)
Potassium: 5 mmol/L (ref 3.5–5.1)
Potassium: 3.5 mmol/L (ref 3.5–5.1)
Potassium: 3.6 mmol/L (ref 3.5–5.1)
Sodium: 133 mmol/L — ABNORMAL LOW (ref 135–145)
Sodium: 131 mmol/L — ABNORMAL LOW (ref 135–145)
Sodium: 135 mmol/L (ref 135–145)

## 2020-03-14 LAB — CBC
HCT: 24 % — ABNORMAL LOW (ref 36.0–46.0)
Hemoglobin: 8 g/dL — ABNORMAL LOW (ref 12.0–15.0)
MCH: 32 pg (ref 26.0–34.0)
MCHC: 33.3 g/dL (ref 30.0–36.0)
MCV: 96 fL (ref 80.0–100.0)
Platelets: 141 10*3/uL — ABNORMAL LOW (ref 150–400)
RBC: 2.5 MIL/uL — ABNORMAL LOW (ref 3.87–5.11)
RDW: 12.3 % (ref 11.5–15.5)
WBC: 11 10*3/uL — ABNORMAL HIGH (ref 4.0–10.5)
nRBC: 0 % (ref 0.0–0.2)

## 2020-03-14 LAB — HEMOGLOBIN A1C
Hgb A1c MFr Bld: 12.1 % — ABNORMAL HIGH (ref 4.8–5.6)
Mean Plasma Glucose: 300.57 mg/dL

## 2020-03-14 LAB — BLOOD GAS, VENOUS
Acid-base deficit: 11.4 mmol/L — ABNORMAL HIGH (ref 0.0–2.0)
Bicarbonate: 12.6 mmol/L — ABNORMAL LOW (ref 20.0–28.0)
O2 Saturation: 84.7 %
Patient temperature: 98.6
pCO2, Ven: 24.8 mmHg — ABNORMAL LOW (ref 44.0–60.0)
pH, Ven: 7.328 (ref 7.250–7.430)
pO2, Ven: 46.6 mmHg — ABNORMAL HIGH (ref 32.0–45.0)

## 2020-03-14 LAB — COMPREHENSIVE METABOLIC PANEL
ALT: 11 U/L (ref 0–44)
AST: 14 U/L — ABNORMAL LOW (ref 15–41)
Albumin: 4.6 g/dL (ref 3.5–5.0)
Alkaline Phosphatase: 111 U/L (ref 38–126)
Anion gap: 22 — ABNORMAL HIGH (ref 5–15)
BUN: 32 mg/dL — ABNORMAL HIGH (ref 6–20)
CO2: 7 mmol/L — ABNORMAL LOW (ref 22–32)
Calcium: 9.3 mg/dL (ref 8.9–10.3)
Chloride: 104 mmol/L (ref 98–111)
Creatinine, Ser: 1.61 mg/dL — ABNORMAL HIGH (ref 0.44–1.00)
GFR calc Af Amer: 40 mL/min — ABNORMAL LOW (ref >60)
GFR calc non Af Amer: 35 mL/min — ABNORMAL LOW (ref >60)
Glucose, Bld: 276 mg/dL — ABNORMAL HIGH (ref 70–99)
Potassium: 4.8 mmol/L (ref 3.5–5.1)
Sodium: 133 mmol/L — ABNORMAL LOW (ref 135–145)
Total Bilirubin: 1.3 mg/dL — ABNORMAL HIGH (ref 0.3–1.2)
Total Protein: 8.3 g/dL — ABNORMAL HIGH (ref 6.5–8.1)

## 2020-03-14 LAB — LACTIC ACID, PLASMA: Lactic Acid, Venous: 3.4 mmol/L (ref 0.5–1.9)

## 2020-03-14 LAB — BETA-HYDROXYBUTYRIC ACID
Beta-Hydroxybutyric Acid: 6.63 mmol/L — ABNORMAL HIGH (ref 0.05–0.27)
Beta-Hydroxybutyric Acid: 1.3 mmol/L — ABNORMAL HIGH (ref 0.05–0.27)
Beta-Hydroxybutyric Acid: 0.89 mmol/L — ABNORMAL HIGH (ref 0.05–0.27)
Beta-Hydroxybutyric Acid: 1.07 mmol/L — ABNORMAL HIGH (ref 0.05–0.27)

## 2020-03-14 LAB — MAGNESIUM
Magnesium: 2.3 mg/dL (ref 1.7–2.4)
Magnesium: 2.1 mg/dL (ref 1.7–2.4)

## 2020-03-14 LAB — PROCALCITONIN: Procalcitonin: 0.85 ng/mL

## 2020-03-14 LAB — PHOSPHORUS
Phosphorus: 2.8 mg/dL (ref 2.5–4.6)
Phosphorus: 1.5 mg/dL — ABNORMAL LOW (ref 2.5–4.6)

## 2020-03-14 MED ORDER — INSULIN DETEMIR 100 UNIT/ML ~~LOC~~ SOLN
0.3000 [IU]/kg | SUBCUTANEOUS | Status: DC
  Administered 2020-03-14 – 2020-03-15 (×2): 25 [IU] via SUBCUTANEOUS
  Filled 2020-03-14 (×3): qty 0.25

## 2020-03-14 MED ORDER — POTASSIUM CHLORIDE 10 MEQ/100ML IV SOLN
10.0000 meq | INTRAVENOUS | Status: AC
  Administered 2020-03-14 (×3): 10 meq via INTRAVENOUS
  Filled 2020-03-14 (×3): qty 100

## 2020-03-14 MED ORDER — CHLORHEXIDINE GLUCONATE CLOTH 2 % EX PADS: 6.0000 | MEDICATED_PAD | Freq: Every day | CUTANEOUS | Status: DC

## 2020-03-14 MED ORDER — SODIUM CHLORIDE 0.45 % IV SOLN: INTRAVENOUS | Status: DC

## 2020-03-14 MED ORDER — SODIUM CHLORIDE 0.9 % IV SOLN
2.0000 g | Freq: Three times a day (TID) | INTRAVENOUS | Status: DC
  Administered 2020-03-14 – 2020-03-15 (×2): 2 g via INTRAVENOUS
  Filled 2020-03-14 (×2): qty 2

## 2020-03-14 MED ORDER — VANCOMYCIN HCL 1500 MG/300ML IV SOLN
1500.0000 mg | Freq: Once | INTRAVENOUS | Status: AC
  Administered 2020-03-14: 1500 mg via INTRAVENOUS
  Filled 2020-03-14: qty 300

## 2020-03-14 MED ORDER — SODIUM CHLORIDE 0.9 % IV SOLN
2.0000 g | Freq: Two times a day (BID) | INTRAVENOUS | Status: DC
  Administered 2020-03-14: 2 g via INTRAVENOUS
  Filled 2020-03-14: qty 2

## 2020-03-14 MED ORDER — INSULIN ASPART 100 UNIT/ML ~~LOC~~ SOLN
0.0000 [IU] | Freq: Every day | SUBCUTANEOUS | Status: DC
  Administered 2020-03-14: 4 [IU] via SUBCUTANEOUS
  Administered 2020-03-15: 2 [IU] via SUBCUTANEOUS
  Administered 2020-03-16: 3 [IU] via SUBCUTANEOUS
  Administered 2020-03-17: 2 [IU] via SUBCUTANEOUS

## 2020-03-14 MED ORDER — SODIUM PHOSPHATES 45 MMOLE/15ML IV SOLN
30.0000 mmol | Freq: Once | INTRAVENOUS | Status: AC
  Administered 2020-03-14: 30 mmol via INTRAVENOUS
  Filled 2020-03-14: qty 10

## 2020-03-14 MED ORDER — METRONIDAZOLE IN NACL 5-0.79 MG/ML-% IV SOLN
500.0000 mg | Freq: Three times a day (TID) | INTRAVENOUS | Status: DC
  Administered 2020-03-14: 500 mg via INTRAVENOUS
  Filled 2020-03-14: qty 100

## 2020-03-14 MED ORDER — HYDRALAZINE HCL 20 MG/ML IJ SOLN
10.0000 mg | INTRAMUSCULAR | Status: DC | PRN
  Administered 2020-03-14 – 2020-03-15 (×2): 10 mg via INTRAVENOUS
  Filled 2020-03-14 (×2): qty 1

## 2020-03-14 MED ORDER — VANCOMYCIN HCL IN DEXTROSE 1-5 GM/200ML-% IV SOLN: 1000.0000 mg | INTRAVENOUS | Status: DC

## 2020-03-14 MED ORDER — VANCOMYCIN HCL 750 MG/150ML IV SOLN
750.0000 mg | Freq: Two times a day (BID) | INTRAVENOUS | Status: DC
  Administered 2020-03-14: 750 mg via INTRAVENOUS
  Filled 2020-03-14 (×2): qty 150

## 2020-03-14 MED ORDER — CHLORHEXIDINE GLUCONATE CLOTH 2 % EX PADS
6.0000 | MEDICATED_PAD | Freq: Every day | CUTANEOUS | Status: DC
  Administered 2020-03-15 – 2020-03-18 (×2): 6 via TOPICAL

## 2020-03-14 MED ORDER — INSULIN ASPART 100 UNIT/ML ~~LOC~~ SOLN
0.0000 [IU] | Freq: Three times a day (TID) | SUBCUTANEOUS | Status: DC
  Administered 2020-03-14: 5 [IU] via SUBCUTANEOUS
  Administered 2020-03-15: 3 [IU] via SUBCUTANEOUS
  Administered 2020-03-15: 11 [IU] via SUBCUTANEOUS
  Administered 2020-03-15: 8 [IU] via SUBCUTANEOUS
  Administered 2020-03-16: 2 [IU] via SUBCUTANEOUS
  Administered 2020-03-16: 15 [IU] via SUBCUTANEOUS
  Administered 2020-03-16: 5 [IU] via SUBCUTANEOUS
  Administered 2020-03-17 (×2): 3 [IU] via SUBCUTANEOUS
  Administered 2020-03-17: 15 [IU] via SUBCUTANEOUS
  Administered 2020-03-18: 3 [IU] via SUBCUTANEOUS

## 2020-03-14 MED ORDER — ORAL CARE MOUTH RINSE
15.0000 mL | Freq: Two times a day (BID) | OROMUCOSAL | Status: DC
  Administered 2020-03-14 – 2020-03-18 (×5): 15 mL via OROMUCOSAL

## 2020-03-14 MED ORDER — INSULIN ASPART 100 UNIT/ML ~~LOC~~ SOLN
4.0000 [IU] | Freq: Three times a day (TID) | SUBCUTANEOUS | Status: DC
  Administered 2020-03-14 – 2020-03-16 (×6): 4 [IU] via SUBCUTANEOUS

## 2020-03-14 NOTE — Progress Notes (Signed)
Patient refusing to be stuck for ABG. Able to draw VBG from new PIV

## 2020-03-14 NOTE — TOC Initial Note (Signed)
Transition of Care [facility]) - Initial/Assessment Note    Patient Details  Name: Pamela Lane MRN: [id number] Date of Birth: 09-Apr-1961  Transition of Care [facility]) CM/SW Contact:    Golda Acre, RN Phone Number: 03/14/2020, 7:35 AM  Clinical Narrative:                 Pt with presenting glucose >600 now 219, iv insulin, iv maxipime, flagyl, vancomycin. hgba1c-12.1, bun 27, creat 1.11/anion gap-12. Lives alone, new diabetic has some psych. Hx. But does take care of herself alone.  Expected Discharge Plan: Home/Self Care Barriers to Discharge: Continued Medical Work up   Patient Goals and CMS Choice Patient states their goals for this hospitalization and ongoing recovery are:: to go back home CMS Medicare.gov Compare Post Acute Care list provided to:: Patient    Expected Discharge Plan and Services Expected Discharge Plan: Home/Self Care   Discharge Planning Services: CM Consult   Living arrangements for the past 2 months: Single Family Home                                      Prior Living Arrangements/Services Living arrangements for the past 2 months: Single Family Home Lives with:: Self Patient language and need for interpreter reviewed:: Yes Do you feel safe going back to the place where you live?: Yes      Need for Family Participation in Patient Care: Yes (Comment) Care giver support system in place?: Yes (comment)   Criminal Activity/Legal Involvement Pertinent to Current Situation/Hospitalization: No - Comment as needed  Activities of Daily Living Home Assistive Devices/Equipment: CBG Meter (previous computer notes state that pt has glucometer at home) ADL Screening (condition at time of admission) Patient's cognitive ability adequate to safely complete daily activities?: Yes Is the patient deaf or have difficulty hearing?: No Does the patient have difficulty seeing, even when wearing glasses/contacts?: No Does the patient have difficulty  concentrating, remembering, or making decisions?: No Patient able to express need for assistance with ADLs?: Yes Does the patient have difficulty dressing or bathing?: Yes Independently performs ADLs?: No Does the patient have difficulty walking or climbing stairs?: Yes Weakness of Legs: Both Weakness of Arms/Hands: Both  Permission Sought/Granted                  Emotional Assessment Appearance:: Appears stated age Attitude/Demeanor/Rapport: Engaged Affect (typically observed): Calm Orientation: : Oriented to Place, Oriented to Self, Oriented to  Time, Oriented to Situation Alcohol / Substance Use: Not Applicable Psych Involvement: No (comment)  Admission diagnosis:  DKA (diabetic ketoacidoses) (HCC) [E11.10] Diabetic ketoacidosis without coma associated with other specified diabetes mellitus (HCC) [E13.10] Patient Active Problem List   Diagnosis Date Noted  . Tobacco abuse 10/04/2019  . DKA, type 2 (HCC) 10/02/2019  . Essential hypertension 10/02/2019  . Bipolar disorder (HCC) 01/17/2019  . Acute pancreatitis 05/15/2016  . AKI (acute kidney injury) (HCC)   . Diabetic ketoacidosis without coma associated with type 2 diabetes mellitus (HCC)   . Hyperglycemia due to type 2 diabetes mellitus (HCC) 01/13/2016  . UTI (urinary tract infection) 01/13/2016  . DKA (diabetic ketoacidoses) (HCC) 01/08/2016  . Hyponatremia 01/08/2016  . Dehydration 01/08/2016  . Urinary tract infection 01/08/2016  . Upper abdominal pain 01/08/2016  . Nausea & vomiting 01/08/2016   PCP:  [facility]:   [facility] - [place], [place] -  [address] [place] [address] Phone: [phone] Fax: [phone]  [facility] [address] ([address]), [place] - [address] [place] ([place]) [place] [address] Phone: [phone] Fax: [phone]     Social Determinants of Health (SDOH)  Interventions    Readmission Risk Interventions Readmission Risk Prevention Plan 10/04/2019  Transportation Screening Complete  PCP or Specialist Appt within 3-5 Days Complete  HRI or Home Care Consult Complete  Medication Review (RN Care Manager) Complete  Some recent data might be hidden

## 2020-03-14 NOTE — Progress Notes (Signed)
   Vital Signs @MEWSNOTE @    03/14/20 0800  Vitals  BP (!) 142/67  MAP (mmHg) 89  Pulse Rate (!) 112  ECG Heart Rate (!) 110  Resp (!) 21  Oxygen Therapy  SpO2 99 %  O2 Device Room Air  MEWS Score  MEWS Temp 0  MEWS Systolic 0  MEWS Pulse 1  MEWS RR 1  MEWS LOC 1  MEWS Score 3  MEWS Score Color Yellow   Mews SCore of 3 - Pt stable. No change in patient status. BP is actually improved from last hour. Will continue to monitor. No new orders at this time.  05/14/20 03/14/2020,8:11 AM

## 2020-03-14 NOTE — Consult Note (Signed)
Dear Doctor: This patient has been identified as a candidate for PICC for the following reason (s): poor veins/poor circulatory system (CHF, COPD, emphysema, diabetes, steroid use, IV drug abuse, etc.) and restarts due to phlebitis and infiltration in 24 hours If you agree, please write an order for the indicated device.   Thank you for supporting the early vascular access assessment program. 

## 2020-03-14 NOTE — Progress Notes (Signed)
Inpatient Diabetes Program Recommendations  AACE/ADA: New Consensus Statement on Inpatient Glycemic Control (2015)  Target Ranges:  Prepandial:   less than 140 mg/dL      Peak postprandial:   less than 180 mg/dL (1-2 hours)      Critically ill patients:  140 - 180 mg/dL   Lab Results  Component Value Date   GLUCAP 175 (H) 03/14/2020   HGBA1C 12.1 (H) 03/14/2020    Review of Glycemic Control  Diabetes history: type 2 Outpatient Diabetes medications: Lantus 50 units daily, Novolog 12 units TID, Metformin 500 mg BID, Glucotrol XL 5 mg daily Current orders for Inpatient glycemic control: IV insulin  Inpatient Diabetes Program Recommendations:   Noted that patient is on IV insulin. Recommendations for transition: Lantus 40 units daily ( or Lantus 20 units BID), Novolog MODERATE correction scale TID & HS scale when eating, Novolog 6 units TID with meals if patient eats at least 50% of meal. Titrate dosages as needed.  Patient needs to follow up with PCP. HgbA1C of 12.1%. Will continue to monitor blood sugars while in the hospital.  Smith Mince RN BSN CDE Diabetes Coordinator Pager: [phone]  8am-5pm

## 2020-03-14 NOTE — Progress Notes (Signed)
eLink Physician-Brief Progress Note Patient Name: Pamela Lane DOB: [birth year] MRN: [id number]   Date of Service  03/14/2020  HPI/Events of Note  Hypertension - BP = 168/65.   eICU Interventions  Plan: 1. Hydralazine 10 mg IV Q 4 hours PRN SBP > 170 or DBP > 100.      Intervention Category Major Interventions: Hypertension - evaluation and management  Raylon Lamson [place] 03/14/2020, 1:16 AM

## 2020-03-14 NOTE — Plan of Care (Signed)
  Problem: Education: Goal: Ability to state activities that reduce stress will improve Outcome: Progressing   Problem: Coping: Goal: Ability to identify and develop effective coping behavior will improve Outcome: Progressing   Problem: Self-Concept: Goal: Ability to identify factors that promote anxiety will improve Outcome: Progressing Goal: Level of anxiety will decrease Outcome: Progressing Goal: Ability to modify response to factors that promote anxiety will improve Outcome: Progressing   Problem: Education: Goal: Ability to describe self-care measures that may prevent or decrease complications (Diabetes Survival Skills Education) will improve Outcome: Progressing Goal: Individualized Educational Video(s) Outcome: Progressing   Problem: Coping: Goal: Ability to adjust to condition or change in health will improve Outcome: Progressing   Problem: Fluid Volume: Goal: Ability to maintain a balanced intake and output will improve Outcome: Progressing   Problem: Health Behavior/Discharge Planning: Goal: Ability to identify and utilize available resources and services will improve Outcome: Progressing Goal: Ability to manage health-related needs will improve Outcome: Progressing   Problem: Metabolic: Goal: Ability to maintain appropriate glucose levels will improve Outcome: Progressing   Problem: Nutritional: Goal: Maintenance of adequate nutrition will improve Outcome: Progressing Goal: Progress toward achieving an optimal weight will improve Outcome: Progressing   Problem: Skin Integrity: Goal: Risk for impaired skin integrity will decrease Outcome: Progressing   Problem: Tissue Perfusion: Goal: Adequacy of tissue perfusion will improve Outcome: Progressing

## 2020-03-14 NOTE — Progress Notes (Addendum)
Pharmacy Antibiotic Note  Pamela Lane is a 59 y.o. female admitted on 03/13/2020 with DKA.  This is likely result of medication non-compliance but covering with broad-spectrum antibiotics until infection can be excluded.  Pharmacy has been consulted for Vancomyin dosing.  Afebrile, elevated WBC AKI- improving with hydration (baseline Scr<0.8)  Plan: Vancomycin 1500mg  IV x1 then 1000mg  IV q24h to target Vanc trough 15-20 Cefepime/Flagyl per MD Monitor renal function and cx data   Height: 5\' 3"  (160 cm) Weight: 82.6 kg (182 lb 1.6 oz) IBW/kg (Calculated) : 52.4  Temp (24hrs), Avg:97.8 F (36.6 C), Min:97.7 F (36.5 C), Max:97.9 F (36.6 C)  Recent Labs  Lab 03/13/20 1418 03/13/20 1650 03/13/20 1929  WBC 19.8*  --   --   CREATININE 1.79*  --   --   LATICACIDVEN  --  4.2* 3.4*    Estimated Creatinine Clearance: 34.9 mL/min (A) (by C-G formula based on SCr of 1.79 mg/dL (H)).    No Known Allergies  Antimicrobials this admission: 9/2 Cefepime >>  9/3 Vancomycin >>  9/3 Flagyl>>  Dose adjustments this admission:  Microbiology results: 9/3 BCx:  9/2 COVID: negative  Thank you for allowing pharmacy to be a part of this patient's care.  11/3 PharmD, BCPS 03/14/2020 12:24 AM

## 2020-03-14 NOTE — Progress Notes (Addendum)
NAME:  Pamela Lane, MRN:  [id number], DOB:  May 30, 1961, LOS: 1 ADMISSION DATE:  03/13/2020, CONSULTATION DATE: 03/13/2020 REFERRING MD: Dr. Rosalia Hammers, CHIEF COMPLAINT: Shortness of breath fatigue  Brief History   This is a 59 year old female past medical history of type, psychiatric disease, type 2 diabetes.  Presents to the ER with hyperglycemia, acidosis consistent with diabetic ketoacidosis.  Pulmonary critical care consulted for ICU admission and management.  Initial pH 6.9, glucose greater than 600.  History of noncompliance with insulin Feeling poorly for the past few days  Was in DKA, acute kidney injury, bicarb of 12  Past Medical History   Past Medical History:  Diagnosis Date  . Asthma   . Clostridium difficile colitis   . Diabetes mellitus without complication (HCC)   . Hypertension   . Mental disorder    pt reports 'I have all of them mental disorders'     Significant Hospital Events   ICU admission  Consults:  PCCM  Procedures:  None  Significant Diagnostic Tests:  Chest x-ray: Right basilar atelectasis no infiltrate, -chest x-ray reviewed by myself  Micro Data:  Blood cultures: Pending Urine cultures: Pending  Antimicrobials:  Cefepime 9/3 Metronidazole 9/3 Vancomycin 9/3  Interim history/subjective:  Awake and interactive, feels thirsty, denies nausea  Objective   Blood pressure (!) 158/63, pulse (!) 110, temperature 98.6 F (37 C), temperature source Oral, resp. rate (!) 21, height 5\' 3"  (1.6 m), weight 82.6 kg, SpO2 100 %.        Intake/Output Summary (Last 24 hours) at 03/14/2020 0810 Last data filed at 03/14/2020 0600 Gross per 24 hour  Intake 1130.68 ml  Output 525 ml  Net 605.68 ml   Filed Weights   03/13/20 2315  Weight: 82.6 kg    Examination: General: Chronically ill-appearing, HENT: Poor dentition Lungs: Clear breath sounds bilaterally Cardiovascular: S1-S2 appreciated Abdomen: Soft, nontender Extremities: No significant  edema Neuro: Moving all extremities to command GU: Deferred  Resolved Hospital Problem list     Assessment & Plan:   Severe diabetic ketoacidosis Anion gap metabolic acidosis Lactic acidosis -Appears to be resolving -Sugars better -Continue to monitor anion gap -Continue Endo tool  AKI, likely prerenal -Creatinine stabilizing -Avoid nephrotoxic's -Trend electrolytes  Pseudohyponatremia secondary to hyperglycemia Type 2 diabetes, uncontrolled, with hyperglycemia  Possible sepsis Leukocytosis, reactive -De-escalate antibiotics-we will stop Flagyl at present, follow cultures -Continue to monitor closely  Elevated hemoglobin, intravascular volume depletion, dehydration -Being fluid resuscitated  Plan: Continue Endo tool Continue fluid resuscitation Trend electrolytes  Decompensation likely due to noncompliance  Hypertension -Hold home antihypertensive at present  Baseline psychiatric disorder -On Seroquel   Critically ill Gap of 12  Best practice:  Diet: N.p.o. Pain/Anxiety/Delirium protocol (if indicated): Not applicable VAP protocol (if indicated): Not applicable DVT prophylaxis: Heparin GI prophylaxis: PPI Glucose control: Endo tool Mobility: Bedrest Code Status: Full Family Communication: patient Disposition: ICU  Labs   CBC: Recent Labs  Lab 03/13/20 1418 03/14/20 0005 03/14/20 0255  WBC 19.8* 26.9* 11.0*  NEUTROABS  --  22.9*  --   HGB 17.2* 15.6* 8.0*  HCT 54.4* 47.1* 24.0*  MCV 98.2 94.6 96.0  PLT 384 347 141*    Basic Metabolic Panel: Recent Labs  Lab 03/13/20 1418 03/14/20 0005 03/14/20 0440  NA 128* 133* 133*  K 6.4* 4.8 5.0  CL 94* 104 109  CO2 <7* 7* 12*  GLUCOSE 691* 276* 219*  BUN 28* 32* 27*  CREATININE 1.79* 1.61* 1.11*  CALCIUM 9.8  9.3 8.5*  MG  --  2.3 2.1  PHOS  --  2.8 1.5*   GFR: Estimated Creatinine Clearance: 56.3 mL/min (A) (by C-G formula based on SCr of 1.11 mg/dL (H)). Recent Labs  Lab  03/13/20 1418 03/13/20 1650 03/13/20 1929 03/14/20 0005 03/14/20 0255  PROCALCITON  --   --   --  0.85  --   WBC 19.8*  --   --  26.9* 11.0*  LATICACIDVEN  --  4.2* 3.4* 3.4*  --     Liver Function Tests: Recent Labs  Lab 03/13/20 1929 03/14/20 0005  AST 13* 14*  ALT 15 11  ALKPHOS 117 111  BILITOT 1.4* 1.3*  PROT 8.3* 8.3*  ALBUMIN 4.5 4.6   No results for input(s): LIPASE, AMYLASE in the last 168 hours. No results for input(s): AMMONIA in the last 168 hours.  ABG    Component Value Date/Time   PHART 6.988 (LL) 03/13/2020 1645   PCO2ART 12.2 (LL) 03/13/2020 1645   PO2ART 117 (H) 03/13/2020 1645   HCO3 12.6 (L) 03/14/2020 0424   TCO2 15 01/12/2016 2315   ACIDBASEDEF 11.4 (H) 03/14/2020 0424   O2SAT 84.7 03/14/2020 0424     Coagulation Profile: No results for input(s): INR, PROTIME in the last 168 hours.  Cardiac Enzymes: No results for input(s): CKTOTAL, CKMB, CKMBINDEX, TROPONINI in the last 168 hours.  HbA1C: Hgb A1c MFr Bld  Date/Time Value Ref Range Status  03/14/2020 12:05 AM 12.1 (H) 4.8 - 5.6 % Final    Comment:    (NOTE) Pre diabetes:          5.7%-6.4%  Diabetes:              >6.4%  Glycemic control for   <7.0% adults with diabetes   10/02/2019 05:30 PM >15.5 (H) 4.8 - 5.6 % Final    Comment:    (NOTE) **Verified by repeat analysis**         Prediabetes: 5.7 - 6.4         Diabetes: >6.4         Glycemic control for adults with diabetes: <7.0     CBG: Recent Labs  Lab 03/14/20 0330 03/14/20 0435 03/14/20 0544 03/14/20 0635 03/14/20 0748  GLUCAP 247* 230* 204* 219* 195*    Review of Systems:   Abdominal discomfort Denies nausea or vomiting No chest pains or discomfort  Past Medical History  She,  has a past medical history of Asthma, Clostridium difficile colitis, Diabetes mellitus without complication (HCC), Hypertension, and Mental disorder.   Surgical History    Past Surgical History:  Procedure Laterality Date  .  ABDOMINAL HYSTERECTOMY       Social History   reports that she has been smoking cigarettes. She has been smoking about 0.25 packs per day. She has never used smokeless tobacco. She reports current alcohol use of about 40.0 standard drinks of alcohol per week. She reports current drug use. Drugs: Marijuana and Cocaine.   Family History   Her family history includes Hypertension in her mother.   Allergies No Known Allergies   The patient is critically ill with multiple organ systems failure and requires high complexity decision making for assessment and support, frequent evaluation and titration of therapies, application of advanced monitoring technologies and extensive interpretation of multiple databases. Critical Care Time devoted to patient care services described in this note independent of APP/resident time (if applicable)  is 35 minutes.   Virl Diamond MD [facility] Personal pager: [phone] If unanswered, please page CCM On-call: [phone]

## 2020-03-15 LAB — BASIC METABOLIC PANEL
Anion gap: 11 (ref 5–15)
BUN: 19 mg/dL (ref 6–20)
CO2: 15 mmol/L — ABNORMAL LOW (ref 22–32)
Calcium: 8.5 mg/dL — ABNORMAL LOW (ref 8.9–10.3)
Chloride: 107 mmol/L (ref 98–111)
Creatinine, Ser: 0.93 mg/dL (ref 0.44–1.00)
GFR calc Af Amer: >60 mL/min (ref >60)
GFR calc non Af Amer: >60 mL/min (ref >60)
Glucose, Bld: 298 mg/dL — ABNORMAL HIGH (ref 70–99)
Potassium: 3.7 mmol/L (ref 3.5–5.1)
Sodium: 133 mmol/L — ABNORMAL LOW (ref 135–145)

## 2020-03-15 LAB — PHOSPHORUS: Phosphorus: 2 mg/dL — ABNORMAL LOW (ref 2.5–4.6)

## 2020-03-15 LAB — GLUCOSE, CAPILLARY
Glucose-Capillary: 326 mg/dL — ABNORMAL HIGH (ref 70–99)
Glucose-Capillary: 297 mg/dL — ABNORMAL HIGH (ref 70–99)
Glucose-Capillary: 158 mg/dL — ABNORMAL HIGH (ref 70–99)
Glucose-Capillary: 225 mg/dL — ABNORMAL HIGH (ref 70–99)

## 2020-03-15 MED ORDER — DILTIAZEM HCL ER COATED BEADS 120 MG PO CP24
120.0000 mg | ORAL_CAPSULE | Freq: Every day | ORAL | Status: DC
  Administered 2020-03-15 – 2020-03-18 (×4): 120 mg via ORAL
  Filled 2020-03-15 (×4): qty 1

## 2020-03-15 MED ORDER — POTASSIUM PHOSPHATES 15 MMOLE/5ML IV SOLN
30.0000 mmol | Freq: Once | INTRAVENOUS | Status: AC
  Administered 2020-03-15: 30 mmol via INTRAVENOUS
  Filled 2020-03-15: qty 10

## 2020-03-15 MED ORDER — METFORMIN HCL ER 500 MG PO TB24
500.0000 mg | ORAL_TABLET | Freq: Two times a day (BID) | ORAL | Status: DC
  Administered 2020-03-15 – 2020-03-18 (×7): 500 mg via ORAL
  Filled 2020-03-15 (×7): qty 1

## 2020-03-15 MED ORDER — ACETAMINOPHEN 325 MG PO TABS
650.0000 mg | ORAL_TABLET | Freq: Four times a day (QID) | ORAL | Status: DC | PRN
  Administered 2020-03-15 – 2020-03-17 (×5): 650 mg via ORAL
  Filled 2020-03-15 (×5): qty 2

## 2020-03-15 MED ORDER — GLIPIZIDE ER 5 MG PO TB24
5.0000 mg | ORAL_TABLET | Freq: Every day | ORAL | Status: DC
  Administered 2020-03-15 – 2020-03-18 (×4): 5 mg via ORAL
  Filled 2020-03-15 (×4): qty 1

## 2020-03-15 MED ORDER — AMOXICILLIN-POT CLAVULANATE 875-125 MG PO TABS
1.0000 | ORAL_TABLET | Freq: Two times a day (BID) | ORAL | Status: DC
  Administered 2020-03-15 – 2020-03-18 (×7): 1 via ORAL
  Filled 2020-03-15 (×7): qty 1

## 2020-03-15 MED ORDER — GABAPENTIN 300 MG PO CAPS
300.0000 mg | ORAL_CAPSULE | Freq: Every day | ORAL | Status: DC
  Administered 2020-03-15 – 2020-03-17 (×3): 300 mg via ORAL
  Filled 2020-03-15 (×3): qty 1

## 2020-03-15 MED ORDER — POTASSIUM CHLORIDE CRYS ER 20 MEQ PO TBCR
20.0000 meq | EXTENDED_RELEASE_TABLET | Freq: Two times a day (BID) | ORAL | Status: AC
  Administered 2020-03-15 (×2): 20 meq via ORAL
  Filled 2020-03-15 (×2): qty 1

## 2020-03-15 NOTE — Progress Notes (Signed)
Results for MIRIELLE, BYRUM (MRN [id number]) as of 03/15/2020 10:39  Ref. Range 03/14/2020 11:22 03/14/2020 12:45 03/14/2020 16:25 03/14/2020 21:14 03/15/2020 07:23  Glucose-Capillary Latest Ref Range: 70 - 99 mg/dL 660 (H) 600 (H) 459 (H) 325 (H) 326 (H)  Noted that blood sugars continue to be greater than 200 mg/dl.  Recommend increasing Levemir to 40 units daily if blood sugars continue to be elevated. Continue Novolog MODERATE correction scale as ordered and Novolog 4 units TID with meals. Titrate dosages as needed.  Smith Mince RN BSN CDE Diabetes Coordinator Pager: [phone]  8am-5pm

## 2020-03-15 NOTE — Progress Notes (Signed)
Spoke with patient on the phone. Patient states that she was diagnosed with diabetes in 2005. Was not any medication for diabetes at that time. Patient states that she has been seen at the [place] [facility] about every 2 months.  Current meds at home: Lantus 50 units daily, Novolog 10 units TID with meals, Metformin 500 mg BID, Glucotrol XL 5 mg daily. Patient states that she does not always take her insulin every day. She did not take insulin 2 days prior to admission.. Discussed Hgba1C of 12.1% = 298 mg/dl on average. Patient did not know her previous A1C.   Patient became very emotional, crying while talking to her on the phone. Discussed sick day rules for blood sugar management. Encouraged to discuss with her PCP what to do about her insulin when she is not feeling well. Patient checks blood sugars "sometimes". States that she has all the supplies she needs and can get her insulin without problems.  Smith Mince RN BSN CDE Diabetes Coordinator Pager: [phone]  8am-5pm

## 2020-03-15 NOTE — Progress Notes (Signed)
eLink Physician-Brief Progress Note Patient Name: Pamela Lane DOB: 1960-11-14 MRN: [id number]   Date of Service  03/15/2020  HPI/Events of Note  Ear pain - Request for Tylenol. AST and ALT are both normal.   eICU Interventions  Tylenol 650 mg PO Q 6 hours PRN pain, ear pain, headache.      Intervention Category Major Interventions: Other:  Lenell Antu 03/15/2020, 3:41 AM

## 2020-03-15 NOTE — Progress Notes (Signed)
NAME:  Pamela Lane, MRN:  [id number], DOB:  24-Sep-1960, LOS: 2 ADMISSION DATE:  03/13/2020, CONSULTATION DATE: 03/13/2020 REFERRING MD: Dr. Rosalia Hammers, CHIEF COMPLAINT: Shortness of breath fatigue  Brief History   This is a 59 year old female past medical history of type, psychiatric disease, type 2 diabetes.  Presents to the ER with hyperglycemia, acidosis consistent with diabetic ketoacidosis.  Pulmonary critical care consulted for ICU admission and management.  Initial pH 6.9, glucose greater than 600.  History of noncompliance with insulin Feeling poorly for the past few days  Was in DKA, acute kidney injury, bicarb of 12  Past Medical History   Past Medical History:  Diagnosis Date  . Asthma   . Clostridium difficile colitis   . Diabetes mellitus without complication (HCC)   . Hypertension   . Mental disorder    pt reports 'I have all of them mental disorders'     Significant Hospital Events   ICU admission  Consults:  PCCM  Procedures:  None  Significant Diagnostic Tests:  Chest x-ray: Right basilar atelectasis no infiltrate, -chest x-ray reviewed by myself  Micro Data:  Blood cultures: No growth Urine cultures: Pending  Antimicrobials:  Cefepime 9/3-9/4 Metronidazole 9/3 Vancomycin 9/3 Augmentin9/4>>  Interim history/subjective:  Awake and interactive, feels thirsty, denies nausea Complaining of ear pain  Objective   Blood pressure (!) 174/79, pulse (!) 104, temperature 99 F (37.2 C), temperature source Oral, resp. rate (!) 21, height 5\' 3"  (1.6 m), weight 86.1 kg, SpO2 98 %.        Intake/Output Summary (Last 24 hours) at 03/15/2020 0814 Last data filed at 03/15/2020 0300 Gross per 24 hour  Intake 2484.11 ml  Output 700 ml  Net 1784.11 ml   Filed Weights   03/13/20 2315 03/15/20 0500  Weight: 82.6 kg 86.1 kg    Examination: General: Chronically ill-appearing, does not appear to be in distress HENT: Poor dentition Lungs: Clear breath sounds  bilaterally, no rales Cardiovascular: S1-S2 appreciated, no murmur Abdomen: Soft, nontender Extremities: No significant edema Neuro: Moving all extremities to command   Resolved Hospital Problem list     Assessment & Plan:  Severe diabetic ketoacidosis Anion gap metabolic acidosis-closing gap -Lactic acidosis-resolved -On insulin -  Ear pain -May have otitis -Augmentin 875 twice daily for 7 days -Pain management  Acute kidney injury, likely prerenal -Stabilizing -Avoid nephrotoxic's -Trend electrolytes  Pseudohyponatremia Diabetes, uncontrolled with hyperglycemia  Possible sepsis -Cultures negative -Augmentin for otitis  Encourage oral intake as much as possible Tolerating diet Decompensation severity likely secondary to noncompliance Hypertension -Restart home Cardizem  Leukocytosis resolved  We will resume oral medications for diabetes-Glucotrol, Glucophage  Restart Neurontin Continue Seroquel   Best practice:  Diet: Carb modified diet Pain/Anxiety/Delirium protocol (if indicated): Resume Neurontin VAP protocol (if indicated): Not applicable DVT prophylaxis: Heparin GI prophylaxis: PPI Glucose control: SSI, oral agents Mobility: Bedrest Code Status: Full Family Communication: patient Disposition: We will transfer to medical floor  Labs   CBC: Recent Labs  Lab 03/13/20 1418 03/14/20 0005 03/14/20 0255  WBC 19.8* 26.9* 11.0*  NEUTROABS  --  22.9*  --   HGB 17.2* 15.6* 8.0*  HCT 54.4* 47.1* 24.0*  MCV 98.2 94.6 96.0  PLT 384 347 141*    Basic Metabolic Panel: Recent Labs  Lab 03/14/20 0005 03/14/20 0440 03/14/20 0959 03/14/20 1312 03/15/20 0233  NA 133* 133* 131* 135 133*  K 4.8 5.0 3.5 3.6 3.7  CL 104 109 105 109 107  CO2 7*  12* 15* 15* 15*  GLUCOSE 276* 219* 184* 100* 298*  BUN 32* 27* 27* 23* 19  CREATININE 1.61* 1.11* 0.97 0.94 0.93  CALCIUM 9.3 8.5* 9.1 8.9 8.5*  MG 2.3 2.1  --   --   --   PHOS 2.8 1.5*  --   --  2.0*    GFR: Estimated Creatinine Clearance: 68.6 mL/min (by C-G formula based on SCr of 0.93 mg/dL). Recent Labs  Lab 03/13/20 1418 03/13/20 1650 03/13/20 1929 03/14/20 0005 03/14/20 0255  PROCALCITON  --   --   --  0.85  --   WBC 19.8*  --   --  26.9* 11.0*  LATICACIDVEN  --  4.2* 3.4* 3.4*  --     Liver Function Tests: Recent Labs  Lab 03/13/20 1929 03/14/20 0005  AST 13* 14*  ALT 15 11  ALKPHOS 117 111  BILITOT 1.4* 1.3*  PROT 8.3* 8.3*  ALBUMIN 4.5 4.6   No results for input(s): LIPASE, AMYLASE in the last 168 hours. No results for input(s): AMMONIA in the last 168 hours.  ABG    Component Value Date/Time   PHART 6.988 (LL) 03/13/2020 1645   PCO2ART 12.2 (LL) 03/13/2020 1645   PO2ART 117 (H) 03/13/2020 1645   HCO3 12.6 (L) 03/14/2020 0424   TCO2 15 01/12/2016 2315   ACIDBASEDEF 11.4 (H) 03/14/2020 0424   O2SAT 84.7 03/14/2020 0424     Coagulation Profile: No results for input(s): INR, PROTIME in the last 168 hours.  Cardiac Enzymes: No results for input(s): CKTOTAL, CKMB, CKMBINDEX, TROPONINI in the last 168 hours.  HbA1C: Hgb A1c MFr Bld  Date/Time Value Ref Range Status  03/14/2020 12:05 AM 12.1 (H) 4.8 - 5.6 % Final    Comment:    (NOTE) Pre diabetes:          5.7%-6.4%  Diabetes:              >6.4%  Glycemic control for   <7.0% adults with diabetes   10/02/2019 05:30 PM >15.5 (H) 4.8 - 5.6 % Final    Comment:    (NOTE) **Verified by repeat analysis**         Prediabetes: 5.7 - 6.4         Diabetes: >6.4         Glycemic control for adults with diabetes: <7.0     CBG: Recent Labs  Lab 03/14/20 1122 03/14/20 1245 03/14/20 1625 03/14/20 2114 03/15/20 0723  GLUCAP 126* 103* 249* 325* 326*    Review of Systems:   Abdominal discomfort Denies nausea or vomiting No chest pains or discomfort  Past Medical History  She,  has a past medical history of Asthma, Clostridium difficile colitis, Diabetes mellitus without complication (HCC),  Hypertension, and Mental disorder.   Surgical History    Past Surgical History:  Procedure Laterality Date  . ABDOMINAL HYSTERECTOMY       Social History   reports that she has been smoking cigarettes. She has been smoking about 0.25 packs per day. She has never used smokeless tobacco. She reports current alcohol use of about 40.0 standard drinks of alcohol per week. She reports current drug use. Drugs: Marijuana and Cocaine.   Family History   Her family history includes Hypertension in her mother.   Allergies No Known Allergies   The patient is critically ill with multiple organ systems failure and requires high complexity decision making for assessment and support, frequent evaluation and titration of therapies, application of advanced  monitoring technologies and extensive interpretation of multiple databases. Critical Care Time devoted to patient care services described in this note independent of APP/resident time (if applicable)  is 35 minutes.   Virl Diamond MD [facility] Personal pager: [phone] If unanswered, please page CCM On-call: [phone]

## 2020-03-15 NOTE — Progress Notes (Signed)
eLink Physician-Brief Progress Note Patient Name: Pamela Lane DOB: [birth year] MRN: [id number]   Date of Service  03/15/2020  HPI/Events of Note  Patient c/o ear pain/pressure on swallowing. I can't examine her ears from eLink. Currently patient on Cefepime IV which should cover otitis media.   eICU Interventions  Plan: 1. Continue Cefepime IV. 2. Rounding team will need to examine the patient's ear on rounds today for further diagnostic information concerning the need for further therapy.      Intervention Category Major Interventions: Other:  Saamir Armstrong Dennard Nip 03/15/2020, 3:08 AM

## 2020-03-16 LAB — BASIC METABOLIC PANEL
Anion gap: 12 (ref 5–15)
BUN: 14 mg/dL (ref 6–20)
CO2: 16 mmol/L — ABNORMAL LOW (ref 22–32)
Calcium: 8.9 mg/dL (ref 8.9–10.3)
Chloride: 106 mmol/L (ref 98–111)
Creatinine, Ser: 0.77 mg/dL (ref 0.44–1.00)
GFR calc Af Amer: >60 mL/min (ref >60)
GFR calc non Af Amer: >60 mL/min (ref >60)
Glucose, Bld: 366 mg/dL — ABNORMAL HIGH (ref 70–99)
Potassium: 4.2 mmol/L (ref 3.5–5.1)
Sodium: 134 mmol/L — ABNORMAL LOW (ref 135–145)

## 2020-03-16 LAB — GLUCOSE, CAPILLARY
Glucose-Capillary: 353 mg/dL — ABNORMAL HIGH (ref 70–99)
Glucose-Capillary: 220 mg/dL — ABNORMAL HIGH (ref 70–99)
Glucose-Capillary: 125 mg/dL — ABNORMAL HIGH (ref 70–99)
Glucose-Capillary: 276 mg/dL — ABNORMAL HIGH (ref 70–99)

## 2020-03-16 LAB — CBC WITH DIFFERENTIAL/PLATELET
Abs Immature Granulocytes: 0.19 10*3/uL — ABNORMAL HIGH (ref 0.00–0.07)
Basophils Absolute: 0.1 10*3/uL (ref 0.0–0.1)
Basophils Relative: 1 %
Eosinophils Absolute: 0 10*3/uL (ref 0.0–0.5)
Eosinophils Relative: 1 %
HCT: 40 % (ref 36.0–46.0)
Hemoglobin: 14.3 g/dL (ref 12.0–15.0)
Immature Granulocytes: 3 %
Lymphocytes Relative: 28 %
Lymphs Abs: 1.9 10*3/uL (ref 0.7–4.0)
MCH: 31.2 pg (ref 26.0–34.0)
MCHC: 35.8 g/dL (ref 30.0–36.0)
MCV: 87.1 fL (ref 80.0–100.0)
Monocytes Absolute: 0.6 10*3/uL (ref 0.1–1.0)
Monocytes Relative: 9 %
Neutro Abs: 3.9 10*3/uL (ref 1.7–7.7)
Neutrophils Relative %: 58 %
Platelets: 231 10*3/uL (ref 150–400)
RBC: 4.59 MIL/uL (ref 3.87–5.11)
RDW: 12.8 % (ref 11.5–15.5)
WBC: 6.7 10*3/uL (ref 4.0–10.5)
nRBC: 0.6 % — ABNORMAL HIGH (ref 0.0–0.2)

## 2020-03-16 MED ORDER — FOLIC ACID 1 MG PO TABS
1.0000 mg | ORAL_TABLET | Freq: Every day | ORAL | Status: DC
  Administered 2020-03-16 – 2020-03-18 (×3): 1 mg via ORAL
  Filled 2020-03-16 (×3): qty 1

## 2020-03-16 MED ORDER — INSULIN DETEMIR 100 UNIT/ML ~~LOC~~ SOLN
35.0000 [IU] | SUBCUTANEOUS | Status: DC
  Administered 2020-03-16: 35 [IU] via SUBCUTANEOUS
  Filled 2020-03-16 (×2): qty 0.35

## 2020-03-16 MED ORDER — LISINOPRIL 10 MG PO TABS
10.0000 mg | ORAL_TABLET | Freq: Every day | ORAL | Status: DC
  Administered 2020-03-16 – 2020-03-18 (×3): 10 mg via ORAL
  Filled 2020-03-16 (×3): qty 1

## 2020-03-16 MED ORDER — ATORVASTATIN CALCIUM 40 MG PO TABS
40.0000 mg | ORAL_TABLET | Freq: Every day | ORAL | Status: DC
  Administered 2020-03-16 – 2020-03-17 (×2): 40 mg via ORAL
  Filled 2020-03-16 (×2): qty 1

## 2020-03-16 MED ORDER — INSULIN ASPART 100 UNIT/ML ~~LOC~~ SOLN
12.0000 [IU] | Freq: Three times a day (TID) | SUBCUTANEOUS | Status: DC
  Administered 2020-03-16 – 2020-03-18 (×5): 12 [IU] via SUBCUTANEOUS

## 2020-03-16 MED ORDER — DILTIAZEM HCL ER COATED BEADS 120 MG PO CP24: 120.0000 mg | ORAL_CAPSULE | Freq: Every day | ORAL | Status: DC

## 2020-03-16 MED ORDER — ASPIRIN EC 81 MG PO TBEC
81.0000 mg | DELAYED_RELEASE_TABLET | Freq: Every day | ORAL | Status: DC
  Administered 2020-03-16 – 2020-03-18 (×3): 81 mg via ORAL
  Filled 2020-03-16 (×3): qty 1

## 2020-03-16 NOTE — Progress Notes (Signed)
PROGRESS NOTE    Pamela Lane  EXB:[id number] DOB: October 24, 1960 DOA: 03/13/2020 PCP: [facility]   Brief Narrative:  This is a 59 year old female past medical history of type, psychiatric disease, type 2 diabetes.  Presents to the ER with hyperglycemia, acidosis consistent with diabetic ketoacidosis.  Pulmonary critical care consulted for ICU admission and management.  Initial pH 6.9, glucose greater than 600.  She was treated for DKA per protocol.  DKA resolved, anion gap closed.  She was transferred under [facility] care on 03/16/2020.  Assessment & Plan:   Active Problems:   DKA (diabetic ketoacidoses) (HCC)   Severe diabetic ketoacidosis in a patient with type 2 uncontrolled diabetes mellitus with hyperglycemia: Anion gap closed.  Acidosis improving.  Lactic acid resolved.  Still hyperglycemic.  Her oral hypoglycemics have been continued by PCCM.  She is on 25 units of Levemir here.  Has a history of noncompliance.  She claims that she takes 50 units of Lantus at home along with 10 units of NovoLog premeal.  Will increase to 35 units and continue SSI.  We will also initiate back on her home regimen of NovoLog which is actually 12 units instead of 10 units.  Hemoglobin A1c 12.8.  Ear pain/left otitis media: Continue Augmentin.  Acute kidney injury, likely prerenal resolved.  Pseudohyponatremia Diabetes, uncontrolled with hyperglycemia.  Very close to normal.  Sepsis ruled out.   DVT prophylaxis: heparin injection 5,000 Units Start: 03/13/20 2200 SCDs Start: 03/13/20 2107 SCDs Start: 03/13/20 2105   Code Status: Full Code  Family Communication:  None present at bedside.  Plan of care discussed with patient in length and he verbalized understanding and agreed with it.  Status is: Inpatient  Remains inpatient appropriate because:Inpatient level of care appropriate due to severity of illness   Dispo: The patient is from: Home              Anticipated d/c  is to: Home              Anticipated d/c date is: 1 day              Patient currently is not medically stable to d/c.        Estimated body mass index is 33.62 kg/m as calculated from the following:   Height as of this encounter: 5\' 3"  (1.6 m).   Weight as of this encounter: 86.1 kg.      Nutritional status:               Consultants:   None  Procedures:   None  Antimicrobials:  Cefepime 9/3-9/4 Metronidazole 9/3 Vancomycin 9/3 Augmentin9/4>> Anti-infectives (From admission, onward)   Start     Dose/Rate Route Frequency Ordered Stop   03/15/20 1000  amoxicillin-clavulanate (AUGMENTIN) 875-125 MG per tablet 1 tablet        1 tablet Oral Every 12 hours 03/15/20 0812 03/22/20 0959   03/15/20 0200  vancomycin (VANCOCIN) IVPB 1000 mg/200 mL premix  Status:  Discontinued        1,000 mg 200 mL/hr over 60 Minutes Intravenous Every 24 hours 03/14/20 0136 03/14/20 1323   03/14/20 2200  vancomycin (VANCOREADY) IVPB 750 mg/150 mL  Status:  Discontinued        750 mg 150 mL/hr over 60 Minutes Intravenous Every 12 hours 03/14/20 1323 03/15/20 0812   03/14/20 1800  ceFEPIme (MAXIPIME) 2 g in sodium chloride 0.9 % 100 mL IVPB  Status:  Discontinued  2 g 200 mL/hr over 30 Minutes Intravenous Every 8 hours 03/14/20 1324 03/15/20 0812   03/14/20 0800  ceFEPIme (MAXIPIME) 2 g in sodium chloride 0.9 % 100 mL IVPB  Status:  Discontinued        2 g 200 mL/hr over 30 Minutes Intravenous Every 12 hours 03/14/20 0136 03/14/20 1324   03/14/20 0200  metroNIDAZOLE (FLAGYL) IVPB 500 mg  Status:  Discontinued        500 mg 100 mL/hr over 60 Minutes Intravenous Every 8 hours 03/14/20 0136 03/14/20 0817   03/14/20 0145  vancomycin (VANCOREADY) IVPB 1500 mg/300 mL        1,500 mg 150 mL/hr over 120 Minutes Intravenous  Once 03/14/20 0136 03/14/20 0424   03/13/20 1815  ceFEPIme (MAXIPIME) 2 g in sodium chloride 0.9 % 100 mL IVPB        2 g 200 mL/hr over 30 Minutes Intravenous   Once 03/13/20 1806 03/13/20 2315   03/13/20 1815  metroNIDAZOLE (FLAGYL) IVPB 500 mg  Status:  Discontinued        500 mg 100 mL/hr over 60 Minutes Intravenous  Once 03/13/20 1806 03/14/20 0136   03/13/20 1815  vancomycin (VANCOCIN) IVPB 1000 mg/200 mL premix  Status:  Discontinued        1,000 mg 200 mL/hr over 60 Minutes Intravenous  Once 03/13/20 1806 03/14/20 0136         Subjective: Seen and examined.  She still complains of left ear pain.  No other complaint.  She claims that she takes 50 units of Lantus in the morning and 10 units of NovoLog with every meal.  Objective: Vitals:   03/15/20 2334 03/16/20 0306 03/16/20 0631 03/16/20 1337  BP: 117/74 (!) 143/76 (!) 156/92 136/81  Pulse: 91 88 (!) 104 (!) 102  Resp: 20 18 20 15   Temp: 98.2 F (36.8 C) 98.3 F (36.8 C) 98 F (36.7 C) 98.2 F (36.8 C)  TempSrc: Oral Oral Oral Oral  SpO2: 98% 100% 100% 97%  Weight:      Height:        Intake/Output Summary (Last 24 hours) at 03/16/2020 1604 Last data filed at 03/16/2020 0919 Gross per 24 hour  Intake 480 ml  Output 700 ml  Net -220 ml   Filed Weights   03/13/20 2315 03/15/20 0500  Weight: 82.6 kg 86.1 kg    Examination:  General exam: Appears calm and comfortable  Respiratory system: Clear to auscultation. Respiratory effort normal. Cardiovascular system: S1 & S2 heard, RRR. No JVD, murmurs, rubs, gallops or clicks. No pedal edema. Gastrointestinal system: Abdomen is nondistended, soft and nontender. No organomegaly or masses felt. Normal bowel sounds heard. Central nervous system: Alert and oriented. No focal neurological deficits. Extremities: Symmetric 5 x 5 power. Skin: No rashes, lesions or ulcers Psychiatry: Judgement and insight appear poor. Mood & affect appropriate.    Data Reviewed: I have personally reviewed following labs and imaging studies  CBC: Recent Labs  Lab 03/13/20 1418 03/14/20 0005 03/14/20 0255 03/16/20 0644  WBC 19.8* 26.9* 11.0*  6.7  NEUTROABS  --  22.9*  --  3.9  HGB 17.2* 15.6* 8.0* 14.3  HCT 54.4* 47.1* 24.0* 40.0  MCV 98.2 94.6 96.0 87.1  PLT 384 347 141* 231   Basic Metabolic Panel: Recent Labs  Lab 03/14/20 0005 03/14/20 0005 03/14/20 0440 03/14/20 0959 03/14/20 1312 03/15/20 0233 03/16/20 0644  NA 133*   < > 133* 131* 135 133* 134*  K  4.8   < > 5.0 3.5 3.6 3.7 4.2  CL 104   < > 109 105 109 107 106  CO2 7*   < > 12* 15* 15* 15* 16*  GLUCOSE 276*   < > 219* 184* 100* 298* 366*  BUN 32*   < > 27* 27* 23* 19 14  CREATININE 1.61*   < > 1.11* 0.97 0.94 0.93 0.77  CALCIUM 9.3   < > 8.5* 9.1 8.9 8.5* 8.9  MG 2.3  --  2.1  --   --   --   --   PHOS 2.8  --  1.5*  --   --  2.0*  --    < > = values in this interval not displayed.   GFR: Estimated Creatinine Clearance: 79.7 mL/min (by C-G formula based on SCr of 0.77 mg/dL). Liver Function Tests: Recent Labs  Lab 03/13/20 1929 03/14/20 0005  AST 13* 14*  ALT 15 11  ALKPHOS 117 111  BILITOT 1.4* 1.3*  PROT 8.3* 8.3*  ALBUMIN 4.5 4.6   No results for input(s): LIPASE, AMYLASE in the last 168 hours. No results for input(s): AMMONIA in the last 168 hours. Coagulation Profile: No results for input(s): INR, PROTIME in the last 168 hours. Cardiac Enzymes: No results for input(s): CKTOTAL, CKMB, CKMBINDEX, TROPONINI in the last 168 hours. BNP (last 3 results) No results for input(s): PROBNP in the last 8760 hours. HbA1C: Recent Labs    03/14/20 0005  HGBA1C 12.1*   CBG: Recent Labs  Lab 03/15/20 1120 03/15/20 1619 03/15/20 2204 03/16/20 0745 03/16/20 1153  GLUCAP 297* 158* 225* 353* 220*   Lipid Profile: No results for input(s): CHOL, HDL, LDLCALC, TRIG, CHOLHDL, LDLDIRECT in the last 72 hours. Thyroid Function Tests: No results for input(s): TSH, T4TOTAL, FREET4, T3FREE, THYROIDAB in the last 72 hours. Anemia Panel: No results for input(s): VITAMINB12, FOLATE, FERRITIN, TIBC, IRON, RETICCTPCT in the last 72 hours. Sepsis  Labs: Recent Labs  Lab 03/13/20 1650 03/13/20 1929 03/14/20 0005  PROCALCITON  --   --  0.85  LATICACIDVEN 4.2* 3.4* 3.4*    Recent Results (from the past 240 hour(s))  SARS Coronavirus 2 by RT PCR (hospital order, performed in [facility] hospital lab) Nasopharyngeal Nasopharyngeal Swab     Status: None   Collection Time: 03/13/20  4:43 PM   Specimen: Nasopharyngeal Swab  Result Value Ref Range Status   SARS Coronavirus 2 NEGATIVE NEGATIVE Final    Comment: (NOTE) SARS-CoV-2 target nucleic acids are NOT DETECTED.  The SARS-CoV-2 RNA is generally detectable in upper and lower respiratory specimens during the acute phase of infection. The lowest concentration of SARS-CoV-2 viral copies this assay can detect is 250 copies / mL. A negative result does not preclude SARS-CoV-2 infection and should not be used as the sole basis for treatment or other patient management decisions.  A negative result may occur with improper specimen collection / handling, submission of specimen other than nasopharyngeal swab, presence of viral mutation(s) within the areas targeted by this assay, and inadequate number of viral copies (<250 copies / mL). A negative result must be combined with clinical observations, patient history, and epidemiological information.  Fact Sheet for Patients:   BoilerBrush.com.cy  Fact Sheet for Healthcare Providers: https://pope.com/  This test is not yet approved or  cleared by the [place] FDA and has been authorized for detection and/or diagnosis of SARS-CoV-2 by FDA under an Emergency Use Authorization (EUA).  This EUA will remain  in effect (meaning this test can be used) for the duration of the COVID-19 declaration under Section 564(b)(1) of the Act, 21 U.S.C. section 360bbb-3(b)(1), unless the authorization is terminated or revoked sooner.  Performed at [facility], [address], [place], [place] [address]   Blood culture (routine x 2)     Status: None (Preliminary result)   Collection Time: 03/13/20  7:29 PM   Specimen: BLOOD  Result Value Ref Range Status   Specimen Description   Final    BLOOD LEFT ANTECUBITAL Performed at [facility], [address] [place], [place] [address]    Special Requests   Final    BOTTLES DRAWN AEROBIC AND ANAEROBIC Blood Culture adequate volume Performed at [facility], [address] [place], [place] [address]    Culture   Final    NO GROWTH 3 DAYS Performed at [facility], [address] [place], [place] [address]    Report Status PENDING  Incomplete  Blood culture (routine x 2)     Status: None (Preliminary result)   Collection Time: 03/14/20 12:05 AM   Specimen: BLOOD  Result Value Ref Range Status   Specimen Description   Final    BLOOD LEFT HAND Performed at [facility], [address], [place], [place] [address]    Special Requests   Final    BOTTLES DRAWN AEROBIC ONLY Blood Culture adequate volume Performed at [facility], [address] [place], [place] [address]    Culture   Final    NO GROWTH 2 DAYS Performed at [facility], [address] [place], [place] [address]    Report Status PENDING  Incomplete      Radiology Studies: No results found.  Scheduled Meds: . amoxicillin-clavulanate  1 tablet Oral Q12H  . Chlorhexidine Gluconate Cloth  6 each Topical Daily  . diltiazem  120 mg Oral Daily  . gabapentin  300 mg Oral QHS  . glipiZIDE  5 mg Oral Q breakfast  . heparin  5,000 Units Subcutaneous Q8H  . insulin aspart  0-15 Units Subcutaneous TID WC  . insulin aspart  0-5 Units Subcutaneous QHS  . insulin aspart  4 Units Subcutaneous TID WC  . insulin detemir  35 Units Subcutaneous Q24H  . mouth rinse  15 mL Mouth Rinse BID  . metFORMIN  500 mg Oral BID WC  . pantoprazole  40 mg Oral Daily  .  QUEtiapine  25 mg Oral QHS   Continuous Infusions: . insulin Stopped (03/14/20 1246)     LOS: 3 days   Time spent: 33 minutes   Hughie Closs, MD [facility]  03/16/2020, 4:04 PM   To contact the attending provider between 7A-7P or the covering provider during after hours 7P-7A, please log into the web site [email or web address]. `

## 2020-03-17 LAB — GLUCOSE, CAPILLARY
Glucose-Capillary: 401 mg/dL — ABNORMAL HIGH (ref 70–99)
Glucose-Capillary: 171 mg/dL — ABNORMAL HIGH (ref 70–99)
Glucose-Capillary: 196 mg/dL — ABNORMAL HIGH (ref 70–99)
Glucose-Capillary: 227 mg/dL — ABNORMAL HIGH (ref 70–99)

## 2020-03-17 LAB — BASIC METABOLIC PANEL
Anion gap: 13 (ref 5–15)
BUN: 14 mg/dL (ref 6–20)
CO2: 23 mmol/L (ref 22–32)
Calcium: 9.1 mg/dL (ref 8.9–10.3)
Chloride: 98 mmol/L (ref 98–111)
Creatinine, Ser: 0.77 mg/dL (ref 0.44–1.00)
GFR calc Af Amer: >60 mL/min (ref >60)
GFR calc non Af Amer: >60 mL/min (ref >60)
Glucose, Bld: 398 mg/dL — ABNORMAL HIGH (ref 70–99)
Potassium: 3.2 mmol/L — ABNORMAL LOW (ref 3.5–5.1)
Sodium: 134 mmol/L — ABNORMAL LOW (ref 135–145)

## 2020-03-17 LAB — GLUCOSE, RANDOM: Glucose, Bld: 425 mg/dL — ABNORMAL HIGH (ref 70–99)

## 2020-03-17 MED ORDER — POTASSIUM CHLORIDE CRYS ER 20 MEQ PO TBCR
40.0000 meq | EXTENDED_RELEASE_TABLET | Freq: Once | ORAL | Status: AC
  Administered 2020-03-17: 40 meq via ORAL
  Filled 2020-03-17: qty 2

## 2020-03-17 MED ORDER — INSULIN DETEMIR 100 UNIT/ML ~~LOC~~ SOLN
50.0000 [IU] | SUBCUTANEOUS | Status: DC
  Administered 2020-03-17 – 2020-03-18 (×2): 50 [IU] via SUBCUTANEOUS
  Filled 2020-03-17 (×2): qty 0.5

## 2020-03-17 MED ORDER — SIMETHICONE 80 MG PO CHEW
80.0000 mg | CHEWABLE_TABLET | Freq: Four times a day (QID) | ORAL | Status: DC | PRN
  Administered 2020-03-17: 80 mg via ORAL
  Filled 2020-03-17: qty 1

## 2020-03-17 MED ORDER — MAGNESIUM SULFATE 2 GM/50ML IV SOLN
2.0000 g | Freq: Once | INTRAVENOUS | Status: AC
  Administered 2020-03-17: 2 g via INTRAVENOUS
  Filled 2020-03-17: qty 50

## 2020-03-17 NOTE — Plan of Care (Signed)
°  Problem: Education: Goal: Ability to state activities that reduce stress will improve Outcome: Progressing   Problem: Coping: Goal: Ability to identify and develop effective coping behavior will improve Outcome: Progressing   Problem: Self-Concept: Goal: Ability to identify factors that promote anxiety will improve Outcome: Progressing Goal: Level of anxiety will decrease Outcome: Progressing Goal: Ability to modify response to factors that promote anxiety will improve Outcome: Progressing   Problem: Education: Goal: Ability to describe self-care measures that may prevent or decrease complications (Diabetes Survival Skills Education) will improve Outcome: Progressing Goal: Individualized Educational Video(s) Outcome: Progressing   Problem: Coping: Goal: Ability to adjust to condition or change in health will improve Outcome: Progressing   Problem: Fluid Volume: Goal: Ability to maintain a balanced intake and output will improve Outcome: Progressing   Problem: Health Behavior/Discharge Planning: Goal: Ability to identify and utilize available resources and services will improve Outcome: Progressing Goal: Ability to manage health-related needs will improve Outcome: Progressing   Problem: Metabolic: Goal: Ability to maintain appropriate glucose levels will improve Outcome: Progressing   Problem: Nutritional: Goal: Maintenance of adequate nutrition will improve Outcome: Progressing Goal: Progress toward achieving an optimal weight will improve Outcome: Progressing   Problem: Skin Integrity: Goal: Risk for impaired skin integrity will decrease Outcome: Progressing   Problem: Tissue Perfusion: Goal: Adequacy of tissue perfusion will improve Outcome: Progressing   Problem: Education: Goal: Knowledge of General Education information will improve Description: Including pain rating scale, medication(s)/side effects and non-pharmacologic comfort measures Outcome:  Progressing   Problem: Health Behavior/Discharge Planning: Goal: Ability to manage health-related needs will improve Outcome: Progressing   Problem: Clinical Measurements: Goal: Ability to maintain clinical measurements within normal limits will improve Outcome: Progressing Goal: Will remain free from infection Outcome: Progressing Goal: Diagnostic test results will improve Outcome: Progressing Goal: Respiratory complications will improve Outcome: Progressing Goal: Cardiovascular complication will be avoided Outcome: Progressing   Problem: Activity: Goal: Risk for activity intolerance will decrease Outcome: Progressing   Problem: Nutrition: Goal: Adequate nutrition will be maintained Outcome: Progressing   Problem: Coping: Goal: Level of anxiety will decrease Outcome: Progressing   Problem: Elimination: Goal: Will not experience complications related to bowel motility Outcome: Progressing Goal: Will not experience complications related to urinary retention Outcome: Progressing   Problem: Pain Managment: Goal: General experience of comfort will improve Outcome: Progressing   Problem: Safety: Goal: Ability to remain free from injury will improve Outcome: Progressing   Problem: Skin Integrity: Goal: Risk for impaired skin integrity will decrease Outcome: Progressing

## 2020-03-17 NOTE — Progress Notes (Addendum)
PROGRESS NOTE    Pamela Lane  PYK:[id number] DOB: 06-19-1961 DOA: 03/13/2020 PCP: [facility]   Brief Narrative:  This is a 59 year old female past medical history of type, psychiatric disease, type 2 diabetes.  Presents to the ER with hyperglycemia, acidosis consistent with diabetic ketoacidosis.  Pulmonary critical care consulted for ICU admission and management.  Initial pH 6.9, glucose greater than 600.  She was treated for DKA per protocol.  DKA resolved, anion gap closed.  She was transferred under [facility] care on 03/16/2020.  Assessment & Plan:   Active Problems:   DKA (diabetic ketoacidoses) (HCC)   Severe diabetic ketoacidosis in a patient with type 2 uncontrolled diabetes mellitus with hyperglycemia: Anion gap closed.  Acidosis improving.  Lactic acid resolved.   Her oral hypoglycemics have been continued by PCCM.   Has a history of noncompliance.  She claims that she takes 50 units of Lantus at home along with 10 units of NovoLog premeal.  Still hyperglycemic and labile.  Upon further questioning, she revealed that she drank orange juice this morning and yesterday morning.  Quite interesting as she has been on diabetic diet here.  Will increase to 50 units and continue SSI and continue 12 units premeal regimen. Hemoglobin A1c 12.8.  Ear pain/left otitis media: Continue Augmentin.  Acute kidney injury, likely prerenal resolved.  Pseudohyponatremia Diabetes, uncontrolled with hyperglycemia.  Very close to normal.  Sepsis ruled out.  Hypokalemia: 3.2.  Replace orally.  DVT prophylaxis: heparin injection 5,000 Units Start: 03/13/20 2200 SCDs Start: 03/13/20 2107 SCDs Start: 03/13/20 2105   Code Status: Full Code  Family Communication:  None present at bedside.  Plan of care discussed with patient in length and he verbalized understanding and agreed with it.  Status is: Inpatient  Remains inpatient appropriate because:Inpatient level of care  appropriate due to severity of illness patient has significant hyperglycemia.  She came in with severe DKA.  With significant hyperglycemia, she remains at risk of going back into DKA unless this is tightly controlled as inpatient by modifications of her antidiabetic regimen thus she needs to stay in the hospital.   Dispo: The patient is from: Home              Anticipated d/c is to: Home              Anticipated d/c date is: 1 day              Patient currently is not medically stable to d/c.        Estimated body mass index is 33.62 kg/m as calculated from the following:   Height as of this encounter: 5\' 3"  (1.6 m).   Weight as of this encounter: 86.1 kg.      Nutritional status:               Consultants:   None  Procedures:   None  Antimicrobials:  Cefepime 9/3-9/4 Metronidazole 9/3 Vancomycin 9/3 Augmentin9/4>> Anti-infectives (From admission, onward)   Start     Dose/Rate Route Frequency Ordered Stop   03/15/20 1000  amoxicillin-clavulanate (AUGMENTIN) 875-125 MG per tablet 1 tablet        1 tablet Oral Every 12 hours 03/15/20 0812 03/22/20 0959   03/15/20 0200  vancomycin (VANCOCIN) IVPB 1000 mg/200 mL premix  Status:  Discontinued        1,000 mg 200 mL/hr over 60 Minutes Intravenous Every 24 hours 03/14/20 0136 03/14/20 1323   03/14/20 2200  vancomycin (VANCOREADY) IVPB 750 mg/150 mL  Status:  Discontinued        750 mg 150 mL/hr over 60 Minutes Intravenous Every 12 hours 03/14/20 1323 03/15/20 0812   03/14/20 1800  ceFEPIme (MAXIPIME) 2 g in sodium chloride 0.9 % 100 mL IVPB  Status:  Discontinued        2 g 200 mL/hr over 30 Minutes Intravenous Every 8 hours 03/14/20 1324 03/15/20 0812   03/14/20 0800  ceFEPIme (MAXIPIME) 2 g in sodium chloride 0.9 % 100 mL IVPB  Status:  Discontinued        2 g 200 mL/hr over 30 Minutes Intravenous Every 12 hours 03/14/20 0136 03/14/20 1324   03/14/20 0200  metroNIDAZOLE (FLAGYL) IVPB 500 mg  Status:   Discontinued        500 mg 100 mL/hr over 60 Minutes Intravenous Every 8 hours 03/14/20 0136 03/14/20 0817   03/14/20 0145  vancomycin (VANCOREADY) IVPB 1500 mg/300 mL        1,500 mg 150 mL/hr over 120 Minutes Intravenous  Once 03/14/20 0136 03/14/20 0424   03/13/20 1815  ceFEPIme (MAXIPIME) 2 g in sodium chloride 0.9 % 100 mL IVPB        2 g 200 mL/hr over 30 Minutes Intravenous  Once 03/13/20 1806 03/13/20 2315   03/13/20 1815  metroNIDAZOLE (FLAGYL) IVPB 500 mg  Status:  Discontinued        500 mg 100 mL/hr over 60 Minutes Intravenous  Once 03/13/20 1806 03/14/20 0136   03/13/20 1815  vancomycin (VANCOCIN) IVPB 1000 mg/200 mL premix  Status:  Discontinued        1,000 mg 200 mL/hr over 60 Minutes Intravenous  Once 03/13/20 1806 03/14/20 0136         Subjective: Seen and examined.  No specific complaint but frustrated due to the fact that her blood sugar still is very high.  Objective: Vitals:   03/16/20 2018 03/17/20 0424 03/17/20 0444 03/17/20 1011  BP: 140/82 113/70  (!) 146/86  Pulse: 95  91   Resp: 20 18    Temp: 98 F (36.7 C) 98.1 F (36.7 C)    TempSrc: Oral Oral    SpO2: 99% 100% 98%   Weight:      Height:        Intake/Output Summary (Last 24 hours) at 03/17/2020 1351 Last data filed at 03/16/2020 1824 Gross per 24 hour  Intake 240 ml  Output --  Net 240 ml   Filed Weights   03/13/20 2315 03/15/20 0500  Weight: 82.6 kg 86.1 kg    Examination:  General exam: Appears calm and comfortable  Respiratory system: Clear to auscultation. Respiratory effort normal. Cardiovascular system: S1 & S2 heard, RRR. No JVD, murmurs, rubs, gallops or clicks. No pedal edema. Gastrointestinal system: Abdomen is nondistended, soft and nontender. No organomegaly or masses felt. Normal bowel sounds heard. Central nervous system: Alert and oriented. No focal neurological deficits. Extremities: Symmetric 5 x 5 power. Skin: No rashes, lesions or ulcers.  Psychiatry: Judgement  and insight appear poor. Mood & affect appropriate.   Data Reviewed: I have personally reviewed following labs and imaging studies  CBC: Recent Labs  Lab 03/13/20 1418 03/14/20 0005 03/14/20 0255 03/16/20 0644  WBC 19.8* 26.9* 11.0* 6.7  NEUTROABS  --  22.9*  --  3.9  HGB 17.2* 15.6* 8.0* 14.3  HCT 54.4* 47.1* 24.0* 40.0  MCV 98.2 94.6 96.0 87.1  PLT 384 347 141* 231  Basic Metabolic Panel: Recent Labs  Lab 03/14/20 0005 03/14/20 0005 03/14/20 0440 03/14/20 0440 03/14/20 0959 03/14/20 0959 03/14/20 1312 03/15/20 0233 03/16/20 0644 03/17/20 0649 03/17/20 0836  NA 133*   < > 133*   < > 131*  --  135 133* 134* 134*  --   K 4.8   < > 5.0   < > 3.5  --  3.6 3.7 4.2 3.2*  --   CL 104   < > 109   < > 105  --  109 107 106 98  --   CO2 7*   < > 12*   < > 15*  --  15* 15* 16* 23  --   GLUCOSE 276*   < > 219*   < > 184*   < > 100* 298* 366* 398* 425*  BUN 32*   < > 27*   < > 27*  --  23* 19 14 14   --   CREATININE 1.61*   < > 1.11*   < > 0.97  --  0.94 0.93 0.77 0.77  --   CALCIUM 9.3   < > 8.5*   < > 9.1  --  8.9 8.5* 8.9 9.1  --   MG 2.3  --  2.1  --   --   --   --   --   --   --   --   PHOS 2.8  --  1.5*  --   --   --   --  2.0*  --   --   --    < > = values in this interval not displayed.   GFR: Estimated Creatinine Clearance: 79.7 mL/min (by C-G formula based on SCr of 0.77 mg/dL). Liver Function Tests: Recent Labs  Lab 03/13/20 1929 03/14/20 0005  AST 13* 14*  ALT 15 11  ALKPHOS 117 111  BILITOT 1.4* 1.3*  PROT 8.3* 8.3*  ALBUMIN 4.5 4.6   No results for input(s): LIPASE, AMYLASE in the last 168 hours. No results for input(s): AMMONIA in the last 168 hours. Coagulation Profile: No results for input(s): INR, PROTIME in the last 168 hours. Cardiac Enzymes: No results for input(s): CKTOTAL, CKMB, CKMBINDEX, TROPONINI in the last 168 hours. BNP (last 3 results) No results for input(s): PROBNP in the last 8760 hours. HbA1C: No results for input(s): HGBA1C in  the last 72 hours. CBG: Recent Labs  Lab 03/16/20 1153 03/16/20 1642 03/16/20 2151 03/17/20 0754 03/17/20 1155  GLUCAP 220* 125* 276* 401* 171*   Lipid Profile: No results for input(s): CHOL, HDL, LDLCALC, TRIG, CHOLHDL, LDLDIRECT in the last 72 hours. Thyroid Function Tests: No results for input(s): TSH, T4TOTAL, FREET4, T3FREE, THYROIDAB in the last 72 hours. Anemia Panel: No results for input(s): VITAMINB12, FOLATE, FERRITIN, TIBC, IRON, RETICCTPCT in the last 72 hours. Sepsis Labs: Recent Labs  Lab 03/13/20 1650 03/13/20 1929 03/14/20 0005  PROCALCITON  --   --  0.85  LATICACIDVEN 4.2* 3.4* 3.4*    Recent Results (from the past 240 hour(s))  SARS Coronavirus 2 by RT PCR (hospital order, performed in [facility] hospital lab) Nasopharyngeal Nasopharyngeal Swab     Status: None   Collection Time: 03/13/20  4:43 PM   Specimen: Nasopharyngeal Swab  Result Value Ref Range Status   SARS Coronavirus 2 NEGATIVE NEGATIVE Final    Comment: (NOTE) SARS-CoV-2 target nucleic acids are NOT DETECTED.  The SARS-CoV-2 RNA is generally detectable in upper and lower respiratory specimens during  the acute phase of infection. The lowest concentration of SARS-CoV-2 viral copies this assay can detect is 250 copies / mL. A negative result does not preclude SARS-CoV-2 infection and should not be used as the sole basis for treatment or other patient management decisions.  A negative result may occur with improper specimen collection / handling, submission of specimen other than nasopharyngeal swab, presence of viral mutation(s) within the areas targeted by this assay, and inadequate number of viral copies (<250 copies / mL). A negative result must be combined with clinical observations, patient history, and epidemiological information.  Fact Sheet for Patients:   BoilerBrush.com.cy  Fact Sheet for Healthcare  Providers: https://pope.com/  This test is not yet approved or  cleared by the [place] FDA and has been authorized for detection and/or diagnosis of SARS-CoV-2 by FDA under an Emergency Use Authorization (EUA).  This EUA will remain in effect (meaning this test can be used) for the duration of the COVID-19 declaration under Section 564(b)(1) of the Act, 21 U.S.C. section 360bbb-3(b)(1), unless the authorization is terminated or revoked sooner.  Performed at [facility], [address], [place], [place] [address]   Blood culture (routine x 2)     Status: None (Preliminary result)   Collection Time: 03/13/20  7:29 PM   Specimen: BLOOD  Result Value Ref Range Status   Specimen Description   Final    BLOOD LEFT ANTECUBITAL Performed at [facility], [address] [place], [place] [address]    Special Requests   Final    BOTTLES DRAWN AEROBIC AND ANAEROBIC Blood Culture adequate volume Performed at [facility], [address] [place], [place] [address]    Culture   Final    NO GROWTH 4 DAYS Performed at [facility], [address] [place], [place] [address]    Report Status PENDING  Incomplete  Blood culture (routine x 2)     Status: None (Preliminary result)   Collection Time: 03/14/20 12:05 AM   Specimen: BLOOD  Result Value Ref Range Status   Specimen Description   Final    BLOOD LEFT HAND Performed at [facility], [address] [place], [place] [address]    Special Requests   Final    BOTTLES DRAWN AEROBIC ONLY Blood Culture adequate volume Performed at [facility], [address], [place], [place] [address]    Culture   Final    NO GROWTH 3 DAYS Performed at [facility], [address], [place], [place] [address]    Report Status PENDING  Incomplete      Radiology Studies: No results found.  Scheduled Meds: .  amoxicillin-clavulanate  1 tablet Oral Q12H  . aspirin EC  81 mg Oral Daily  . atorvastatin  40 mg Oral QHS  . Chlorhexidine Gluconate Cloth  6 each Topical Daily  . diltiazem  120 mg Oral Daily  . folic acid  1 mg Oral Daily  . gabapentin  300 mg Oral QHS  . glipiZIDE  5 mg Oral Q breakfast  . heparin  5,000 Units Subcutaneous Q8H  . insulin aspart  0-15 Units Subcutaneous TID WC  . insulin aspart  0-5 Units Subcutaneous QHS  . insulin aspart  12 Units Subcutaneous TID WC  . insulin detemir  50 Units Subcutaneous Q24H  . lisinopril  10 mg Oral Daily  . mouth rinse  15 mL Mouth Rinse BID  . metFORMIN  500 mg Oral BID WC  . pantoprazole  40 mg Oral Daily  . QUEtiapine  25 mg Oral QHS   Continuous Infusions: . insulin Stopped (03/14/20 1246)     LOS: 4 days   Time spent: 28 minutes   Hughie Closs, MD [facility]  03/17/2020, 1:51 PM   To contact the attending provider between 7A-7P or the covering provider during after hours 7P-7A, please log into the web site [email or web address]. `

## 2020-03-18 LAB — BASIC METABOLIC PANEL
Anion gap: 9 (ref 5–15)
BUN: 13 mg/dL (ref 6–20)
CO2: 23 mmol/L (ref 22–32)
Calcium: 8.6 mg/dL — ABNORMAL LOW (ref 8.9–10.3)
Chloride: 106 mmol/L (ref 98–111)
Creatinine, Ser: 0.49 mg/dL (ref 0.44–1.00)
GFR calc Af Amer: >60 mL/min (ref >60)
GFR calc non Af Amer: >60 mL/min (ref >60)
Glucose, Bld: 191 mg/dL — ABNORMAL HIGH (ref 70–99)
Potassium: 3.4 mmol/L — ABNORMAL LOW (ref 3.5–5.1)
Sodium: 138 mmol/L (ref 135–145)

## 2020-03-18 LAB — MAGNESIUM: Magnesium: 1.8 mg/dL (ref 1.7–2.4)

## 2020-03-18 LAB — GLUCOSE, CAPILLARY: Glucose-Capillary: 200 mg/dL — ABNORMAL HIGH (ref 70–99)

## 2020-03-18 LAB — CULTURE, BLOOD (ROUTINE X 2)
Culture: NO GROWTH
Special Requests: ADEQUATE

## 2020-03-18 MED ORDER — AMOXICILLIN-POT CLAVULANATE 875-125 MG PO TABS: 1.0000 | ORAL_TABLET | Freq: Two times a day (BID) | ORAL | 0 refills | Status: AC

## 2020-03-18 MED ORDER — AMOXICILLIN-POT CLAVULANATE 875-125 MG PO TABS
1.0000 | ORAL_TABLET | Freq: Two times a day (BID) | ORAL | 0 refills | Status: DC
Start: 2020-03-18 — End: 2020-03-18

## 2020-03-18 MED ORDER — POTASSIUM CHLORIDE CRYS ER 20 MEQ PO TBCR
40.0000 meq | EXTENDED_RELEASE_TABLET | Freq: Once | ORAL | Status: AC
  Administered 2020-03-18: 40 meq via ORAL
  Filled 2020-03-18: qty 2

## 2020-03-18 NOTE — Discharge Instructions (Signed)
Diabetic Ketoacidosis °Diabetic ketoacidosis is a serious complication of diabetes. This condition develops when there is not enough insulin in the body. Insulin is an hormone that regulates blood sugar levels in the body. Normally, insulin allows glucose to enter the cells in the body. The cells break down glucose for energy. Without enough insulin, the body cannot break down glucose, so it breaks down fats instead. This leads to high blood glucose levels in the body and the production of acids that are called ketones. Ketones are poisonous at high levels. °If diabetic ketoacidosis is not treated, it can cause severe dehydration and can lead to a coma or death. °What are the causes? °This condition develops when a lack of insulin causes the body to break down fats instead of glucose. This may be triggered by: °· Stress on the body. This stress is brought on by an illness. °· Infection. °· Medicines that raise blood glucose levels. °· Not taking diabetes medicine. °· New onset of type 1 diabetes mellitus. °What are the signs or symptoms? °Symptoms of this condition include: °· Fatigue. °· Weight loss. °· Excessive thirst. °· Light-headedness. °· Fruity or sweet-smelling breath. °· Excessive urination. °· Vision changes. °· Confusion or irritability. °· Nausea. °· Vomiting. °· Rapid breathing. °· Abdominal pain. °· Feeling flushed. °How is this diagnosed? °This condition is diagnosed based on your medical history, a physical exam, and blood tests. You may also have a urine test to check for ketones. °How is this treated? °This condition may be treated with: °· Fluid replacement. This may be done to correct dehydration. °· Insulin injections. These may be given through the skin or through an IV tube. °· Electrolyte replacement. Electrolytes are minerals in your blood. Electrolytes such as potassium and sodium may be given in pill form or through an IV tube. °· Antibiotic medicines. These may be prescribed if your  condition was caused by an infection. °Diabetic ketoacidosis is a serious medical condition. You may need emergency treatment in the hospital to monitor your condition. °Follow these instructions at home: °Eating and drinking °· Drink enough fluids to keep your urine clear or pale yellow. °· If you are not able to eat, drink clear fluids in small amounts as you are able. Clear fluids include water, ice chips, fruit juice with water added (diluted), and low-calorie sports drinks. You may also have sugar-free jello or popsicles. °· If you are able to eat, follow your usual diet and drink sugar-free liquids, such as water. °Medicines °· Take over-the-counter and prescription medicines only as told by your health care provider. °· Continue to take insulin and other diabetes medicines as told by your health care provider. °· If you were prescribed an antibiotic, take it as told by your health care provider. Do not stop taking the antibiotic even if you start to feel better. °General instructions ° °· Check your urine for ketones when you are ill and as told by your health care provider. °? If your blood glucose is 240 mg/dL (13.3 mmol/L) or higher, check your urine ketones every 4-6 hours. °· Check your blood glucose every day, as often as told by your health care provider. °? If your blood glucose is high, drink plenty of fluids. This helps to flush out ketones. °? If your blood glucose is above your target for 2 tests in a row, contact your health care provider. °· Carry a medical alert card or wear medical alert jewelry that says that you have diabetes. °· Rest   and exercise only as told by your health care provider. Do not exercise when your blood glucose is high and you have ketones in your urine. °· If you get sick, call your health care provider and begin treatment quickly. Your body often needs extra insulin to fight an illness. Check your blood glucose every 4-6 hours when you are sick. °· Keep all follow-up  visits as told by your health care provider. This is important. °Contact a health care provider if: °· Your blood glucose level is higher than 240 mg/dL (13.3 mmol/L) for 2 days in a row. °· You have moderate or large ketones in your urine. °· You have a fever. °· You cannot eat or drink without vomiting. °· You have been vomiting for more than 2 hours. °· You continue to have symptoms of diabetic ketoacidosis. °· You develop new symptoms. °Get help right away if: °· Your blood glucose monitor reads “high” even when you are taking insulin. °· You faint. °· You have chest pain. °· You have trouble breathing. °· You have sudden trouble speaking or swallowing. °· You have vomiting or diarrhea that gets worse after 3 hours. °· You are unable to stay awake. °· You have trouble thinking. °· You are severely dehydrated. Symptoms of severe dehydration include: °? Extreme thirst. °? Dry mouth. °? Rapid breathing. °These symptoms may represent a serious problem that is an emergency. Do not wait to see if the symptoms will go away. Get medical help right away. Call your local emergency services (911 in the [place] Do not drive yourself to the hospital. °Summary °· Diabetic ketoacidosis is a serious complication of diabetes. This condition develops when there is not enough insulin in the body. °· This condition is diagnosed based on your medical history, a physical exam, and blood tests. You may also have a urine test to check for ketones. °· Diabetic ketoacidosis is a serious medical condition. You may need emergency treatment in the hospital to monitor your condition. °· Contact your health care provider if your blood glucose is higher than 240 mg/dl for 2 days in a row or if you have moderate or large ketones in your urine. °This information is not intended to replace advice given to you by your health care provider. Make sure you discuss any questions you have with your health care provider. °Document Revised: 08/13/2016  Document Reviewed: 08/02/2016 °Elsevier Patient Education © 2020 [employer] ° °

## 2020-03-18 NOTE — Discharge Summary (Addendum)
Physician Discharge Summary  Pamela Lane ZOX:[id number] DOB: 12/02/1960 DOA: 03/13/2020  PCP: [facility]  Admit date: 03/13/2020 Discharge date: 03/18/2020  Admitted From: Home Disposition: Home  Recommendations for Outpatient Follow-up:  1. Follow up with PCP in 1-2 weeks 2. Please obtain BMP/CBC in one week 3. Please follow up with your PCP on the following pending results: Unresulted Labs (From admission, onward)          Start     Ordered   03/16/20 0500  CBC with Differential/Platelet  Tomorrow morning,   R       Question:  Specimen collection method  Answer:  Lab=Lab collect   03/15/20 1800   03/15/20 0500  Basic metabolic panel  Daily,   R      03/14/20 0906   03/13/20 2107  CBC  (heparin)  Once,   STAT       Comments: Baseline for heparin therapy IF NOT ALREADY DRAWN.  Notify MD if PLT < 100 K.    03/13/20 2111   03/13/20 2105  CBC  (heparin)  Once,   STAT       Comments: Baseline for heparin therapy IF NOT ALREADY DRAWN.  Notify MD if PLT < 100 K.    03/13/20 2106           Home Health: None Equipment/Devices: None  Discharge Condition: Stable CODE STATUS: Full code Diet recommendation: Diabetic  Subjective: Seen and examined.  Feels much better.  Wants to go home.  Brief/Interim Summary: This is a 59 year old female past medical history of type, psychiatric disease, type 2 diabetes. Presents to the ER with hyperglycemia, acidosis consistent with diabetic ketoacidosis. Pulmonary critical care consulted for ICU admission and management. Initial pH 6.9, glucose greater than 600.  She was treated for DKA per protocol.  DKA resolved, anion gap closed.  She was transferred under Pamela Lane care on 03/16/2020.  After that, she remained hyperglycemic.  Later found out that patient was drinking orange juice every morning.  Her long-acting insulin was escalated back to her home dose of 50 units and she was also put back on her premeal regimen of 12  units NovoLog along with her oral antidiabetic regimen.  Finally her blood sugar is better controlled but is still in 200 range.  It looks like noncompliance with proper diabetic diet is likely the cause as she tends to eat whatever she likes which she is not supposed to do as she is diabetic.  She now understands that and has shown motivation to stick to diabetic diet.  If she does, her current regime should be able to control her blood sugar and bring her hemoglobin A1c to the goal.  She is being discharged in stable condition.  Of note, while she was in ICU, she complained of left ear pain was diagnosed with left otitis media and started on Augmentin.  She has completed 4 days of Augmentin.  She is being prescribed 3 more days to complete 7 days.  Discharge Diagnoses:  Active Problems:   DKA (diabetic ketoacidoses) [facility](HCC)    Discharge Instructions  Discharge Instructions    Discharge patient   Complete by: As directed    Discharge disposition: 01-Home or Self Care   Discharge patient date: 03/18/2020     Allergies as of 03/18/2020   No Known Allergies     Medication List    TAKE these medications   albuterol 108 (90 Base) MCG/ACT inhaler Commonly known as: VENTOLIN  HFA Inhale 2 puffs into the lungs every 6 (six) hours as needed for wheezing or shortness of breath.   amoxicillin-clavulanate 875-125 MG tablet Commonly known as: AUGMENTIN Take 1 tablet by mouth every 12 (twelve) hours for 3 days.   aspirin EC 81 MG tablet Take 81 mg by mouth daily.   atorvastatin 40 MG tablet Commonly known as: LIPITOR Take 40 mg by mouth at bedtime.   buPROPion 150 MG 12 hr tablet Commonly known as: WELLBUTRIN SR Take 150 mg by mouth 2 (two) times daily.   cetirizine 10 MG tablet Commonly known as: ZYRTEC Take 10 mg by mouth daily.   diltiazem 120 MG 24 hr capsule Commonly known as: CARDIZEM CD Take 1 capsule (120 mg total) by mouth 2 (two) times a day. What changed: when to take  this   folic acid 1 MG tablet Commonly known as: FOLVITE Take 1 tablet (1 mg total) by mouth daily.   gabapentin 300 MG capsule Commonly known as: NEURONTIN Take 300 mg by mouth at bedtime.   glipiZIDE 5 MG 24 hr tablet Commonly known as: GLUCOTROL XL Take 1 tablet (5 mg total) by mouth daily with breakfast.   insulin glargine 100 UNIT/ML Solostar Pen Commonly known as: LANTUS Inject 50 Units into the skin daily.   lisinopril 10 MG tablet Commonly known as: Prinivil Take 1 tablet (10 mg total) by mouth daily.   lurasidone 40 MG Tabs tablet Commonly known as: LATUDA Take 1 tablet (40 mg total) by mouth every evening.   metFORMIN 500 MG 24 hr tablet Commonly known as: GLUCOPHAGE-XR Take 500 mg by mouth 2 (two) times daily.   multivitamin with minerals Tabs tablet Take 1 tablet by mouth daily.   NovoLOG FlexPen 100 UNIT/ML FlexPen Generic drug: insulin aspart Inject 12 Units into the skin 3 (three) times daily with meals.   thiamine 100 MG tablet Take 1 tablet (100 mg total) by mouth daily.       Follow-up Information    [facility] Follow up in 1 week(s).   Specialty: General Practice Contact information: [address] [place] [address] [phone]              No Known Allergies  Consultations: None   Procedures/Studies: DG Chest Port 1 View  Result Date: 03/13/2020 CLINICAL DATA:  Dyspnea EXAM: PORTABLE CHEST 1 VIEW COMPARISON:  Radiograph 05/24/2019 FINDINGS: Streaky opacities in the periphery of the right lung base and to a lesser extent the left lower lung favor atelectasis. No consolidation, features of edema, pneumothorax, or effusion. The cardiomediastinal contours are unremarkable. No acute osseous or soft tissue abnormality. Telemetry leads overlie the chest. IMPRESSION: Streaky opacities in the periphery of the right lung base and to a lesser extent the left lower lung favor atelectasis. No other  acute cardiopulmonary abnormality. Electronically Signed   By: Kreg Shropshire M.D.   On: 03/13/2020 17:37      Discharge Exam: Vitals:   03/17/20 2100 03/18/20 0629  BP: 138/71 127/73  Pulse: 87 89  Resp: 17 18  Temp: 98 F (36.7 C) 98 F (36.7 C)  SpO2: 99% 97%   Vitals:   03/17/20 1011 03/17/20 1505 03/17/20 2100 03/18/20 0629  BP: (!) 146/86 117/71 138/71 127/73  Pulse:  (!) 102 87 89  Resp:  17 17 18   Temp:  98 F (36.7 C) 98 F (36.7 C) 98 F (36.7 C)  TempSrc:  Oral Oral Oral  SpO2:  100% 99% 97%  Weight:      Height:        General: Pt is alert, awake, not in acute distress Cardiovascular: RRR, S1/S2 +, no rubs, no gallops Respiratory: CTA bilaterally, no wheezing, no rhonchi Abdominal: Soft, NT, ND, bowel sounds + Extremities: no edema, no cyanosis    The results of significant diagnostics from this hospitalization (including imaging, microbiology, ancillary and laboratory) are listed below for reference.     Microbiology: Recent Results (from the past 240 hour(s))  SARS Coronavirus 2 by RT PCR (hospital order, performed in [facility] hospital lab) Nasopharyngeal Nasopharyngeal Swab     Status: None   Collection Time: 03/13/20  4:43 PM   Specimen: Nasopharyngeal Swab  Result Value Ref Range Status   SARS Coronavirus 2 NEGATIVE NEGATIVE Final    Comment: (NOTE) SARS-CoV-2 target nucleic acids are NOT DETECTED.  The SARS-CoV-2 RNA is generally detectable in upper and lower respiratory specimens during the acute phase of infection. The lowest concentration of SARS-CoV-2 viral copies this assay can detect is 250 copies / mL. A negative result does not preclude SARS-CoV-2 infection and should not be used as the sole basis for treatment or other patient management decisions.  A negative result may occur with improper specimen collection / handling, submission of specimen other than nasopharyngeal swab, presence of viral mutation(s) within the areas  targeted by this assay, and inadequate number of viral copies (<250 copies / mL). A negative result must be combined with clinical observations, patient history, and epidemiological information.  Fact Sheet for Patients:   BoilerBrush.com.cy  Fact Sheet for Healthcare Providers: https://pope.com/  This test is not yet approved or  cleared by the [place] FDA and has been authorized for detection and/or diagnosis of SARS-CoV-2 by FDA under an Emergency Use Authorization (EUA).  This EUA will remain in effect (meaning this test can be used) for the duration of the COVID-19 declaration under Section 564(b)(1) of the Act, 21 U.S.C. section 360bbb-3(b)(1), unless the authorization is terminated or revoked sooner.  Performed at [facility], [address] [place], [place] [address]   Blood culture (routine x 2)     Status: None   Collection Time: 03/13/20  7:29 PM   Specimen: BLOOD  Result Value Ref Range Status   Specimen Description   Final    BLOOD LEFT ANTECUBITAL Performed at [facility], [address], [place], [place] [address]    Special Requests   Final    BOTTLES DRAWN AEROBIC AND ANAEROBIC Blood Culture adequate volume Performed at [facility], [address] [place], [place] [address]    Culture   Final    NO GROWTH 5 DAYS Performed at [facility], [address], [place], [place] [address]    Report Status 03/18/2020 FINAL  Final  Blood culture (routine x 2)     Status: None (Preliminary result)   Collection Time: 03/14/20 12:05 AM   Specimen: BLOOD  Result Value Ref Range Status   Specimen Description   Final    BLOOD LEFT HAND Performed at [facility], [address], [place], [place] [address]    Special Requests   Final    BOTTLES DRAWN AEROBIC ONLY Blood Culture adequate volume Performed at [facility], [address], [place], [place] [address]    Culture   Final    NO GROWTH 4 DAYS Performed at [facility], [address] [place], [place] [address]    Report Status PENDING  Incomplete     Labs: BNP (last 3 results) No results for input(s): BNP in the last 8760 hours. Basic Metabolic Panel: Recent Labs  Lab 03/14/20 0005 03/14/20 0005 03/14/20 0440 03/14/20 0959 03/14/20 1312 03/14/20 1312 03/15/20 0233 03/16/20 0644 03/17/20 0649 03/17/20 0836 03/18/20 0550  NA 133*   < > 133*   < > 135  --  133* 134* 134*  --  138  K 4.8   < > 5.0   < > 3.6  --  3.7 4.2 3.2*  --  3.4*  CL 104   < > 109   < > 109  --  107 106 98  --  106  CO2 7*   < > 12*   < > 15*  --  15* 16* 23  --  23  GLUCOSE 276*   < > 219*   < > 100*   < > 298* 366* 398* 425* 191*  BUN 32*   < > 27*   < > 23*  --  19 14 14   --  13  CREATININE 1.61*   < > 1.11*   < > 0.94  --  0.93 0.77 0.77  --  0.49  CALCIUM 9.3   < > 8.5*   < > 8.9  --  8.5* 8.9 9.1  --  8.6*  MG 2.3  --  2.1  --   --   --   --   --   --   --  1.8  PHOS 2.8  --  1.5*  --   --   --  2.0*  --   --   --   --    < > = values in this interval not displayed.   Liver Function Tests: Recent Labs  Lab 03/13/20 1929 03/14/20 0005  AST 13* 14*  ALT 15 11  ALKPHOS 117 111  BILITOT 1.4* 1.3*  PROT 8.3* 8.3*  ALBUMIN 4.5 4.6   No results for input(s): LIPASE, AMYLASE in the last 168 hours. No results for input(s): AMMONIA in the last 168 hours. CBC: Recent Labs  Lab 03/13/20 1418 03/14/20 0005 03/14/20 0255 03/16/20 0644  WBC 19.8* 26.9* 11.0* 6.7  NEUTROABS  --  22.9*  --  3.9  HGB 17.2* 15.6* 8.0* 14.3  HCT 54.4* 47.1* 24.0* 40.0  MCV 98.2 94.6 96.0 87.1  PLT 384 347 141* 231   Cardiac Enzymes: No results for input(s): CKTOTAL, CKMB, CKMBINDEX, TROPONINI in the last 168 hours. BNP: Invalid input(s): POCBNP CBG: Recent Labs  Lab 03/17/20 0754 03/17/20 1155 03/17/20 1744 03/17/20 2102 03/18/20 0754  GLUCAP  401* 171* 196* 227* 200*   D-Dimer No results for input(s): DDIMER in the last 72 hours. Hgb A1c No results for input(s): HGBA1C in the last 72 hours. Lipid Profile No results for input(s): CHOL, HDL, LDLCALC, TRIG, CHOLHDL, LDLDIRECT in the last 72 hours. Thyroid function studies No results for input(s): TSH, T4TOTAL, T3FREE, THYROIDAB in the last 72 hours.  Invalid input(s): FREET3 Anemia work up No results for input(s): VITAMINB12, FOLATE, FERRITIN, TIBC, IRON, RETICCTPCT in the last 72 hours. Urinalysis    Component Value Date/Time   COLORURINE YELLOW 03/13/2020 2300   APPEARANCEUR CLEAR 03/13/2020 2300   LABSPEC 1.016 03/13/2020 2300   PHURINE 5.0 03/13/2020 2300   GLUCOSEU >=500 (A) 03/13/2020 2300   HGBUR MODERATE (A) 03/13/2020 2300  BILIRUBINUR NEGATIVE 03/13/2020 2300   KETONESUR 80 (A) 03/13/2020 2300   PROTEINUR 100 (A) 03/13/2020 2300   NITRITE NEGATIVE 03/13/2020 2300   LEUKOCYTESUR NEGATIVE 03/13/2020 2300   Sepsis Labs Invalid input(s): PROCALCITONIN,  WBC,  LACTICIDVEN Microbiology Recent Results (from the past 240 hour(s))  SARS Coronavirus 2 by RT PCR (hospital order, performed in [facility] hospital lab) Nasopharyngeal Nasopharyngeal Swab     Status: None   Collection Time: 03/13/20  4:43 PM   Specimen: Nasopharyngeal Swab  Result Value Ref Range Status   SARS Coronavirus 2 NEGATIVE NEGATIVE Final    Comment: (NOTE) SARS-CoV-2 target nucleic acids are NOT DETECTED.  The SARS-CoV-2 RNA is generally detectable in upper and lower respiratory specimens during the acute phase of infection. The lowest concentration of SARS-CoV-2 viral copies this assay can detect is 250 copies / mL. A negative result does not preclude SARS-CoV-2 infection and should not be used as the sole basis for treatment or other patient management decisions.  A negative result may occur with improper specimen collection / handling, submission of specimen other than nasopharyngeal  swab, presence of viral mutation(s) within the areas targeted by this assay, and inadequate number of viral copies (<250 copies / mL). A negative result must be combined with clinical observations, patient history, and epidemiological information.  Fact Sheet for Patients:   BoilerBrush.com.cy  Fact Sheet for Healthcare Providers: https://pope.com/  This test is not yet approved or  cleared by the [place] FDA and has been authorized for detection and/or diagnosis of SARS-CoV-2 by FDA under an Emergency Use Authorization (EUA).  This EUA will remain in effect (meaning this test can be used) for the duration of the COVID-19 declaration under Section 564(b)(1) of the Act, 21 U.S.C. section 360bbb-3(b)(1), unless the authorization is terminated or revoked sooner.  Performed at [facility], [address] [place], [place] [address]   Blood culture (routine x 2)     Status: None   Collection Time: 03/13/20  7:29 PM   Specimen: BLOOD  Result Value Ref Range Status   Specimen Description   Final    BLOOD LEFT ANTECUBITAL Performed at [facility], [address] [place], [place] [address]    Special Requests   Final    BOTTLES DRAWN AEROBIC AND ANAEROBIC Blood Culture adequate volume Performed at [facility], [address], [place], [place] [address]    Culture   Final    NO GROWTH 5 DAYS Performed at [facility], [address] [place], [place] [address]    Report Status 03/18/2020 FINAL  Final  Blood culture (routine x 2)     Status: None (Preliminary result)   Collection Time: 03/14/20 12:05 AM   Specimen: BLOOD  Result Value Ref Range Status   Specimen Description   Final    BLOOD LEFT HAND Performed at [facility], [address], [place], [place] [address]    Special Requests   Final    BOTTLES DRAWN AEROBIC ONLY Blood Culture  adequate volume Performed at [facility], [address], [place], [place] [address]    Culture   Final    NO GROWTH 4 DAYS Performed at [facility], [address] [place], [place] [address]    Report Status PENDING  Incomplete     Time coordinating discharge: Over 30 minutes  SIGNED:   Hughie Closs, MD  [facility] 03/18/2020, 9:27 AM  If 7PM-7AM, please contact night-coverage [email or web address]

## 2020-03-19 LAB — CULTURE, BLOOD (ROUTINE X 2)
Culture: NO GROWTH
Special Requests: ADEQUATE

## 2020-03-20 IMAGING — CT CT ABD-PELV W/ CM
2 of 5 series · 16 of 46 positions shown, 18 images · IV contrast (omnipaque)
Comparison: 05/11/2016

CLINICAL DATA: Patient's complained of generalized abdominal pain
when and also she is complaining of her RT hip having a knot on it
for 1 year.

EXAM:
CT ABDOMEN AND PELVIS WITH CONTRAST
TECHNIQUE: Multidetector CT imaging of the abdomen and pelvis was performed
using the standard protocol following bolus administration of
intravenous contrast.
CONTRAST:  100mL OMNIPAQUE IOHEXOL 300 MG/ML  SOLN

[Series 2: axial st · axial · 0.98mm/px · z∈[-518,-93]mm · 13 of 96 slices shown, 15 images]
[im 6/96  soft-tissue]
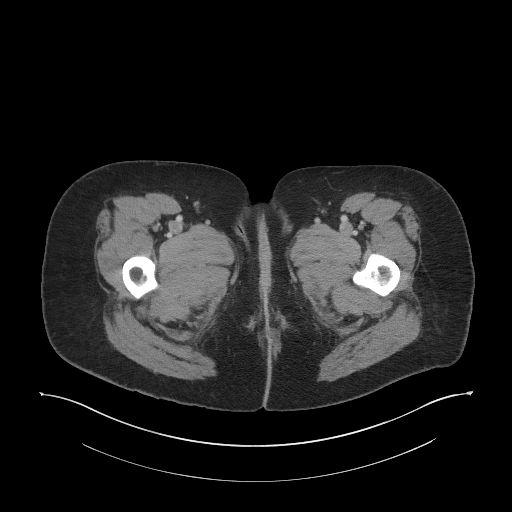
[im 6/96  bone]
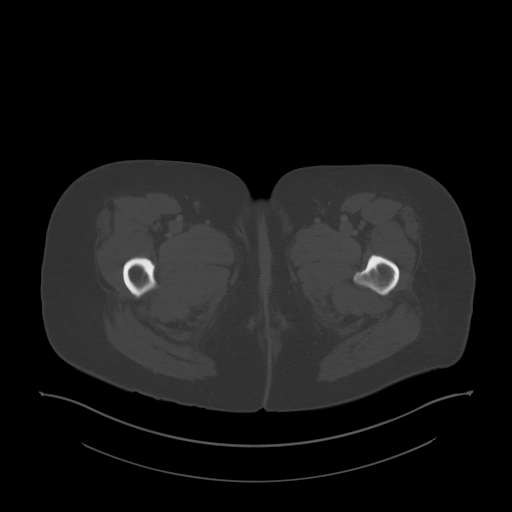
[im 16/96  soft-tissue]
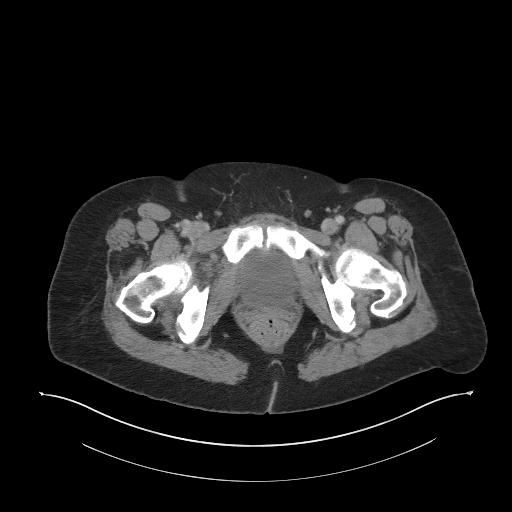
[im 21/96  soft-tissue]
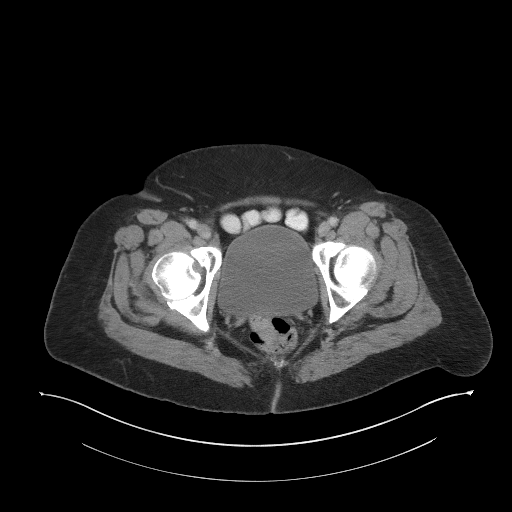
[im 26/96  soft-tissue]
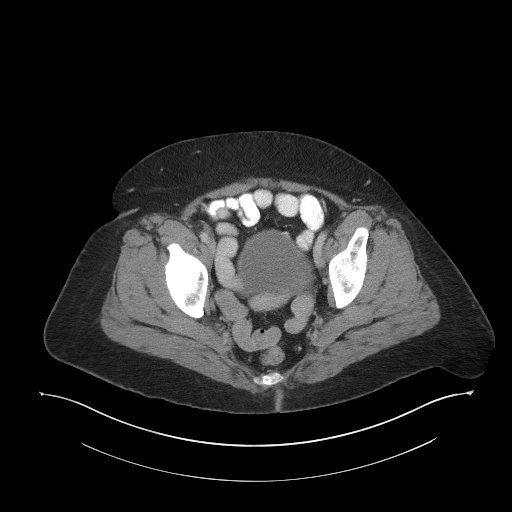
[im 36/96  soft-tissue]
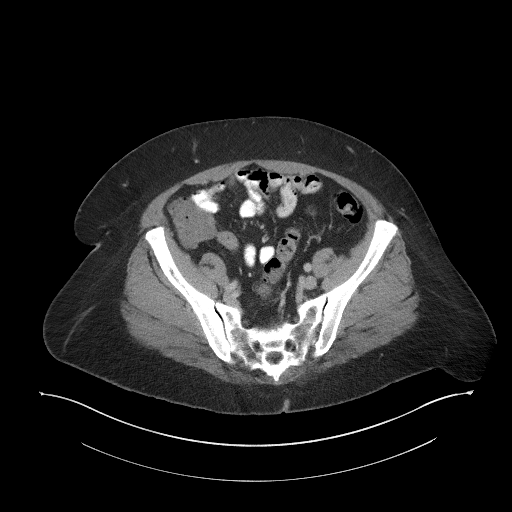
[im 41/96  soft-tissue]
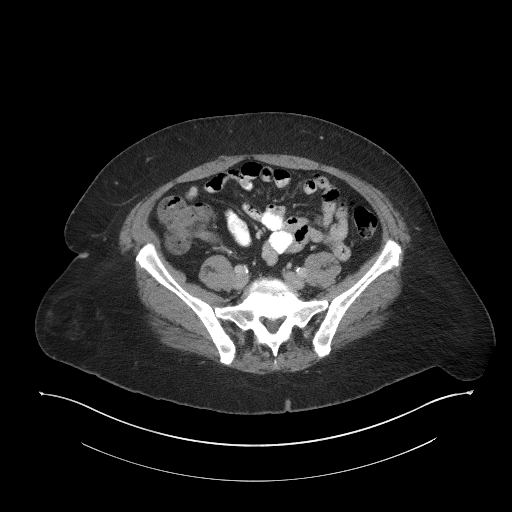
[im 51/96  soft-tissue]
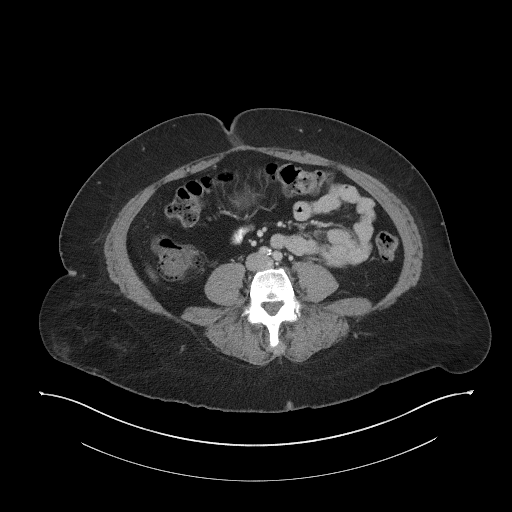
[im 56/96  soft-tissue]
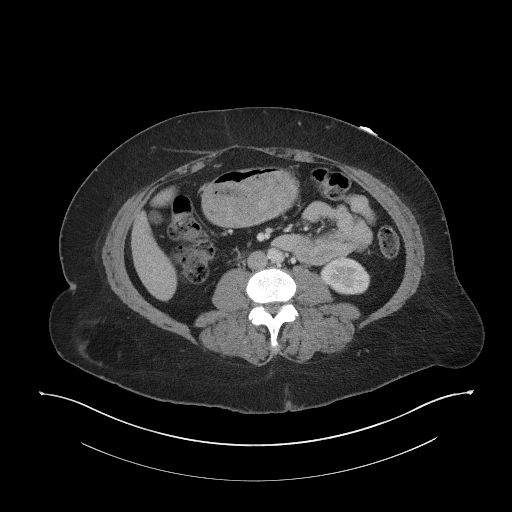
[im 61/96  soft-tissue]
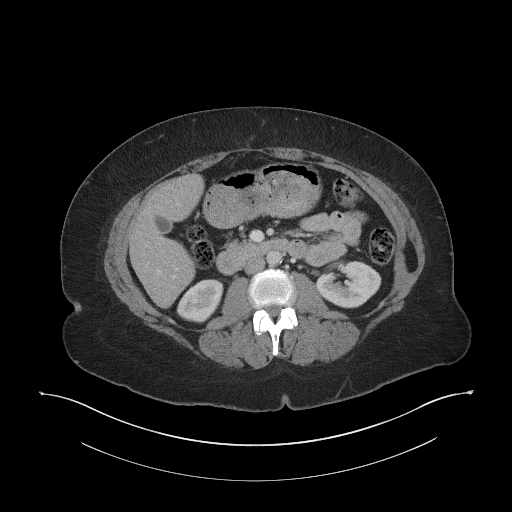
[im 61/96  bone]
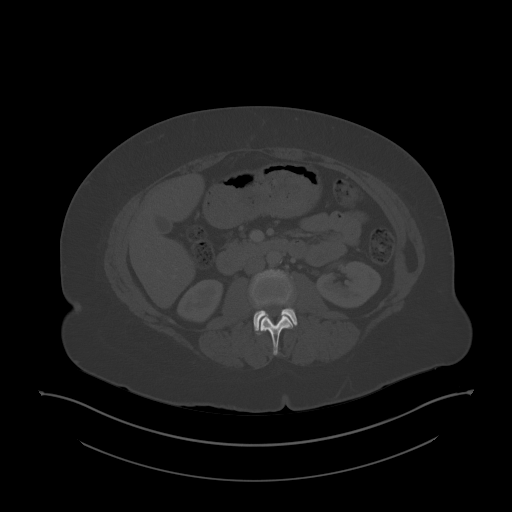
[im 71/96  soft-tissue]
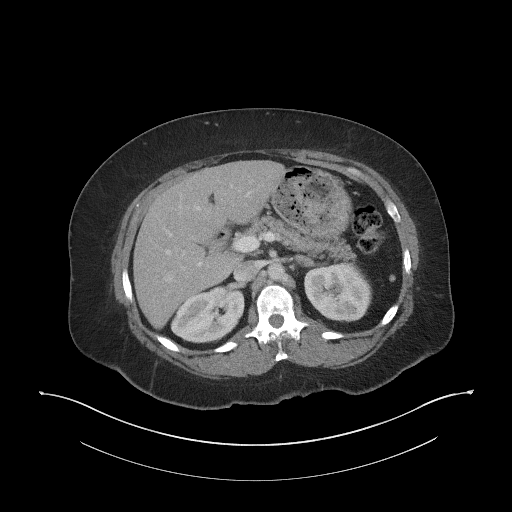
[im 76/96  soft-tissue]
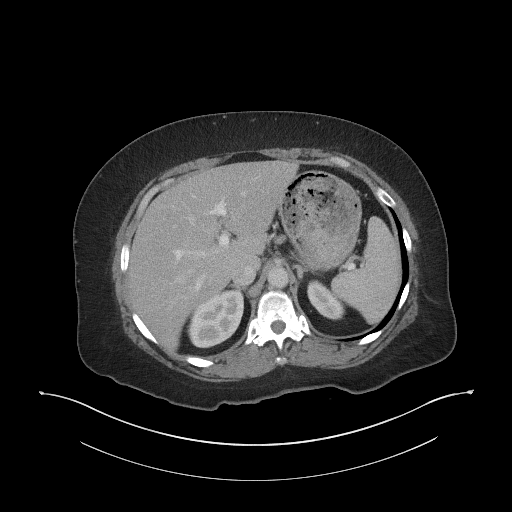
[im 81/96  soft-tissue]
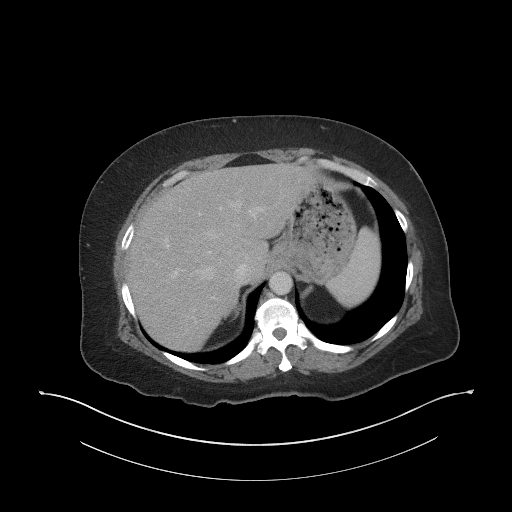
[im 91/96  soft-tissue]
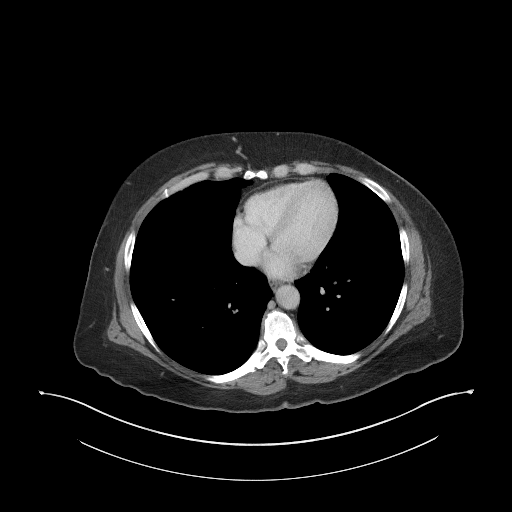

[Series 5: coronal st · coronal · 0.86mm/px · 3 of 99 slices shown]
[im 33/99  soft-tissue]
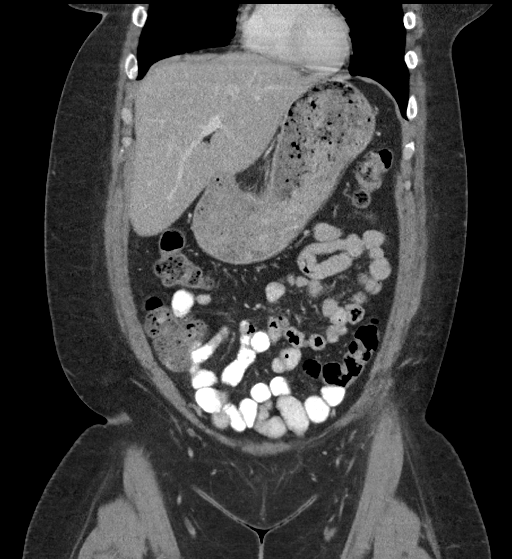
[im 44/99  soft-tissue]
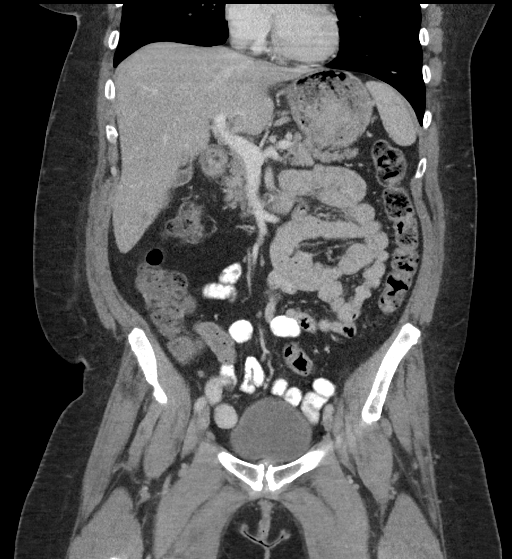
[im 55/99  soft-tissue]
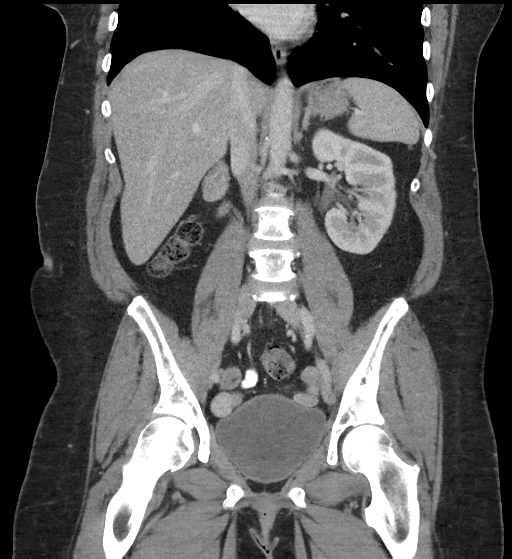

[16 of 46 positions shown; findings below may reference images not displayed]

FINDINGS: Lower chest: Clear lung bases.  Heart normal in size.

Hepatobiliary: No focal liver abnormality is seen. No gallstones,
gallbladder wall thickening, or biliary dilatation.

Pancreas: Unremarkable. No pancreatic ductal dilatation or
surrounding inflammatory changes.

Spleen: Normal in size without focal abnormality.

Adrenals/Urinary Tract: Adrenal glands are unremarkable. Kidneys are
normal, without renal calculi, focal lesion, or hydronephrosis.
Bladder is unremarkable.

Stomach/Bowel: Stomach is within normal limits. Appendix appears
normal. No evidence of bowel wall thickening, distention, or
inflammatory changes.

Vascular/Lymphatic: Mild aortic atherosclerosis. No aneurysm. No
pathologically enlarged lymph nodes.

Reproductive: Status post hysterectomy. No adnexal masses.

Other: There is a soft tissue mass in the subcutaneous fat of the
right lower posterior back, the level of the iliac crest, measuring
11.7 x 7.6 x 6.8 cm. This mass is mostly of fat attenuation. It has
a thin. There is reticular and hazy soft tissue attenuation within
this as well as 2 areas of ossification. This lesion was present but
incompletely visualized on the prior CT. It is unclear whether it
has changed in size or characterization.

No abdominal hernia.  No ascites.

Musculoskeletal: No fracture or acute finding. No osteoblastic or
osteolytic lesions.
IMPRESSION: 1. The knot on the right hip is a primarily fat containing mass
measuring 11.7 cm in greatest dimension. It is complicated by
internal areas of soft tissue attenuation as well as 2 areas
ossification. This has a benign/nonaggressive appearance, but a
well-differentiated liposarcoma is not excluded. If the patient has
pain associated with this or there has been any increase in size,
follow-up MRI with and without contrast would be recommended.
2. No acute findings within the abdomen or pelvis. No findings to
account for generalized abdominal pain.
3. Mild aortic atherosclerosis.

## 2020-04-01 NOTE — ED Provider Notes (Signed)
Addendum to my note of 03/13/20 .Critical Care Performed by: Margarita Grizzle, MD Authorized by: Margarita Grizzle, MD   Critical care provider statement:    Critical care time (minutes):  45   Critical care was necessary to treat or prevent imminent or life-threatening deterioration of the following conditions:  Endocrine crisis and shock   Critical care was time spent personally by me on the following activities:  Discussions with consultants, evaluation of patient's response to treatment, examination of patient, ordering and performing treatments and interventions, ordering and review of laboratory studies, ordering and review of radiographic studies, pulse oximetry, re-evaluation of patient's condition, obtaining history from patient or surrogate and review of old charts      Margarita Grizzle, MD 04/01/20 1307

## 2020-08-27 ENCOUNTER — Other Ambulatory Visit: Payer: Self-pay

## 2020-08-27 ENCOUNTER — Inpatient Hospital Stay (HOSPITAL_COMMUNITY)
Admission: EM | Admit: 2020-08-27 | Discharge: 2020-08-29 | DRG: 638 | Disposition: A | Discharge status: Home / Self Care | Payer: Medicaid Other | Attending: Internal Medicine | Admitting: Internal Medicine

## 2020-08-27 ENCOUNTER — Encounter (HOSPITAL_COMMUNITY): Payer: Self-pay | Admitting: Emergency Medicine

## 2020-08-27 DIAGNOSIS — Z794 Long term (current) use of insulin: Secondary | ICD-10-CM | POA: Diagnosis not present

## 2020-08-27 DIAGNOSIS — Z7982 Long term (current) use of aspirin: Secondary | ICD-10-CM | POA: Diagnosis not present

## 2020-08-27 DIAGNOSIS — E871 Hypo-osmolality and hyponatremia: Secondary | ICD-10-CM | POA: Diagnosis present

## 2020-08-27 DIAGNOSIS — E876 Hypokalemia: Secondary | ICD-10-CM | POA: Diagnosis present

## 2020-08-27 DIAGNOSIS — F1010 Alcohol abuse, uncomplicated: Secondary | ICD-10-CM | POA: Diagnosis present

## 2020-08-27 DIAGNOSIS — Z8249 Family history of ischemic heart disease and other diseases of the circulatory system: Secondary | ICD-10-CM

## 2020-08-27 DIAGNOSIS — J45909 Unspecified asthma, uncomplicated: Secondary | ICD-10-CM | POA: Diagnosis present

## 2020-08-27 DIAGNOSIS — F17210 Nicotine dependence, cigarettes, uncomplicated: Secondary | ICD-10-CM | POA: Diagnosis present

## 2020-08-27 DIAGNOSIS — F319 Bipolar disorder, unspecified: Secondary | ICD-10-CM | POA: Diagnosis present

## 2020-08-27 DIAGNOSIS — Z20822 Contact with and (suspected) exposure to covid-19: Secondary | ICD-10-CM | POA: Diagnosis present

## 2020-08-27 DIAGNOSIS — E785 Hyperlipidemia, unspecified: Secondary | ICD-10-CM | POA: Diagnosis present

## 2020-08-27 DIAGNOSIS — E860 Dehydration: Secondary | ICD-10-CM | POA: Diagnosis present

## 2020-08-27 DIAGNOSIS — Z9114 Patient's other noncompliance with medication regimen: Secondary | ICD-10-CM

## 2020-08-27 DIAGNOSIS — I10 Essential (primary) hypertension: Secondary | ICD-10-CM | POA: Diagnosis present

## 2020-08-27 DIAGNOSIS — E1110 Type 2 diabetes mellitus with ketoacidosis without coma: Principal | ICD-10-CM | POA: Diagnosis present

## 2020-08-27 DIAGNOSIS — Z7984 Long term (current) use of oral hypoglycemic drugs: Secondary | ICD-10-CM | POA: Diagnosis not present

## 2020-08-27 DIAGNOSIS — Z79899 Other long term (current) drug therapy: Secondary | ICD-10-CM

## 2020-08-27 DIAGNOSIS — N179 Acute kidney failure, unspecified: Secondary | ICD-10-CM | POA: Diagnosis present

## 2020-08-27 LAB — CBC WITH DIFFERENTIAL/PLATELET
Abs Immature Granulocytes: 0.05 10*3/uL (ref 0.00–0.07)
Basophils Absolute: 0.1 10*3/uL (ref 0.0–0.1)
Basophils Relative: 1 %
Eosinophils Absolute: 0 10*3/uL (ref 0.0–0.5)
Eosinophils Relative: 0 %
HCT: 45.2 % (ref 36.0–46.0)
Hemoglobin: 15.1 g/dL — ABNORMAL HIGH (ref 12.0–15.0)
Immature Granulocytes: 1 %
Lymphocytes Relative: 20 %
Lymphs Abs: 2.1 10*3/uL (ref 0.7–4.0)
MCH: 31.2 pg (ref 26.0–34.0)
MCHC: 33.4 g/dL (ref 30.0–36.0)
MCV: 93.4 fL (ref 80.0–100.0)
Monocytes Absolute: 0.5 10*3/uL (ref 0.1–1.0)
Monocytes Relative: 5 %
Neutro Abs: 7.8 10*3/uL — ABNORMAL HIGH (ref 1.7–7.7)
Neutrophils Relative %: 73 %
Platelets: 341 10*3/uL (ref 150–400)
RBC: 4.84 MIL/uL (ref 3.87–5.11)
RDW: 13.2 % (ref 11.5–15.5)
WBC: 10.6 10*3/uL — ABNORMAL HIGH (ref 4.0–10.5)
nRBC: 0 % (ref 0.0–0.2)

## 2020-08-27 LAB — BASIC METABOLIC PANEL
Anion gap: 22 — ABNORMAL HIGH (ref 5–15)
Anion gap: 15 (ref 5–15)
Anion gap: 10 (ref 5–15)
BUN: 20 mg/dL (ref 6–20)
BUN: 15 mg/dL (ref 6–20)
BUN: 12 mg/dL (ref 6–20)
CO2: 13 mmol/L — ABNORMAL LOW (ref 22–32)
CO2: 14 mmol/L — ABNORMAL LOW (ref 22–32)
CO2: 19 mmol/L — ABNORMAL LOW (ref 22–32)
Calcium: 9.5 mg/dL (ref 8.9–10.3)
Calcium: 8.4 mg/dL — ABNORMAL LOW (ref 8.9–10.3)
Calcium: 8.8 mg/dL — ABNORMAL LOW (ref 8.9–10.3)
Chloride: 97 mmol/L — ABNORMAL LOW (ref 98–111)
Chloride: 108 mmol/L (ref 98–111)
Chloride: 108 mmol/L (ref 98–111)
Creatinine, Ser: 1.46 mg/dL — ABNORMAL HIGH (ref 0.44–1.00)
Creatinine, Ser: 0.96 mg/dL (ref 0.44–1.00)
Creatinine, Ser: 0.87 mg/dL (ref 0.44–1.00)
GFR, Estimated: 41 mL/min — ABNORMAL LOW (ref >60)
GFR, Estimated: >60 mL/min (ref >60)
GFR, Estimated: >60 mL/min (ref >60)
Glucose, Bld: 580 mg/dL (ref 70–99)
Glucose, Bld: 193 mg/dL — ABNORMAL HIGH (ref 70–99)
Glucose, Bld: 236 mg/dL — ABNORMAL HIGH (ref 70–99)
Potassium: 4.8 mmol/L (ref 3.5–5.1)
Potassium: 4.3 mmol/L (ref 3.5–5.1)
Potassium: 3.6 mmol/L (ref 3.5–5.1)
Sodium: 132 mmol/L — ABNORMAL LOW (ref 135–145)
Sodium: 137 mmol/L (ref 135–145)
Sodium: 137 mmol/L (ref 135–145)

## 2020-08-27 LAB — BETA-HYDROXYBUTYRIC ACID: Beta-Hydroxybutyric Acid: >8 mmol/L — ABNORMAL HIGH (ref 0.05–0.27)

## 2020-08-27 LAB — CBG MONITORING, ED
Glucose-Capillary: >600 mg/dL (ref 70–99)
Glucose-Capillary: 539 mg/dL (ref 70–99)
Glucose-Capillary: 262 mg/dL — ABNORMAL HIGH (ref 70–99)
Glucose-Capillary: 197 mg/dL — ABNORMAL HIGH (ref 70–99)
Glucose-Capillary: 207 mg/dL — ABNORMAL HIGH (ref 70–99)
Glucose-Capillary: 182 mg/dL — ABNORMAL HIGH (ref 70–99)
Glucose-Capillary: 200 mg/dL — ABNORMAL HIGH (ref 70–99)

## 2020-08-27 LAB — URINALYSIS, ROUTINE W REFLEX MICROSCOPIC
Bilirubin Urine: NEGATIVE
Glucose, UA: >=500 mg/dL — AB
Hgb urine dipstick: NEGATIVE
Ketones, ur: 80 mg/dL — AB
Leukocytes,Ua: NEGATIVE
Nitrite: NEGATIVE
Protein, ur: NEGATIVE mg/dL
Specific Gravity, Urine: 1.017 (ref 1.005–1.030)
pH: 5 (ref 5.0–8.0)

## 2020-08-27 LAB — MRSA PCR SCREENING: MRSA by PCR: NEGATIVE

## 2020-08-27 LAB — GLUCOSE, CAPILLARY
Glucose-Capillary: 157 mg/dL — ABNORMAL HIGH (ref 70–99)
Glucose-Capillary: 159 mg/dL — ABNORMAL HIGH (ref 70–99)
Glucose-Capillary: 173 mg/dL — ABNORMAL HIGH (ref 70–99)
Glucose-Capillary: 180 mg/dL — ABNORMAL HIGH (ref 70–99)
Glucose-Capillary: 220 mg/dL — ABNORMAL HIGH (ref 70–99)
Glucose-Capillary: 248 mg/dL — ABNORMAL HIGH (ref 70–99)

## 2020-08-27 LAB — SARS CORONAVIRUS 2 (TAT 6-24 HRS): SARS Coronavirus 2: NEGATIVE

## 2020-08-27 LAB — BLOOD GAS, VENOUS
Acid-base deficit: 14.3 mmol/L — ABNORMAL HIGH (ref 0.0–2.0)
Bicarbonate: 13 mmol/L — ABNORMAL LOW (ref 20.0–28.0)
FIO2: 21
O2 Saturation: 48.1 %
Patient temperature: 98.6
pCO2, Ven: 34.9 mmHg — ABNORMAL LOW (ref 44.0–60.0)
pH, Ven: 7.194 — CL (ref 7.250–7.430)
pO2, Ven: <31 mmHg — CL (ref 32.0–45.0)

## 2020-08-27 LAB — HEMOGLOBIN A1C
Hgb A1c MFr Bld: 13.7 % — ABNORMAL HIGH (ref 4.8–5.6)
Mean Plasma Glucose: 346.49 mg/dL

## 2020-08-27 LAB — HIV ANTIBODY (ROUTINE TESTING W REFLEX): HIV Screen 4th Generation wRfx: NONREACTIVE

## 2020-08-27 MED ORDER — ACETAMINOPHEN 650 MG RE SUPP: 650.0000 mg | Freq: Four times a day (QID) | RECTAL | Status: DC | PRN

## 2020-08-27 MED ORDER — INSULIN REGULAR(HUMAN) IN NACL 100-0.9 UT/100ML-% IV SOLN
INTRAVENOUS | Status: DC
  Administered 2020-08-27: 17 [IU]/h via INTRAVENOUS
  Administered 2020-08-27: 4.8 [IU]/h via INTRAVENOUS

## 2020-08-27 MED ORDER — LACTATED RINGERS IV SOLN: INTRAVENOUS | Status: DC

## 2020-08-27 MED ORDER — ORAL CARE MOUTH RINSE
15.0000 mL | Freq: Two times a day (BID) | OROMUCOSAL | Status: DC
  Administered 2020-08-27 – 2020-08-28 (×3): 15 mL via OROMUCOSAL

## 2020-08-27 MED ORDER — ONDANSETRON HCL 4 MG/2ML IJ SOLN: 4.0000 mg | Freq: Four times a day (QID) | INTRAMUSCULAR | Status: DC | PRN

## 2020-08-27 MED ORDER — DEXTROSE IN LACTATED RINGERS 5 % IV SOLN: INTRAVENOUS | Status: DC

## 2020-08-27 MED ORDER — POTASSIUM CHLORIDE 10 MEQ/100ML IV SOLN
10.0000 meq | INTRAVENOUS | Status: AC
  Administered 2020-08-27 (×2): 10 meq via INTRAVENOUS
  Filled 2020-08-27 (×2): qty 100

## 2020-08-27 MED ORDER — ENOXAPARIN SODIUM 40 MG/0.4ML ~~LOC~~ SOLN
40.0000 mg | SUBCUTANEOUS | Status: DC
  Administered 2020-08-27 – 2020-08-28 (×2): 40 mg via SUBCUTANEOUS
  Filled 2020-08-27 (×2): qty 0.4

## 2020-08-27 MED ORDER — INSULIN ASPART 100 UNIT/ML ~~LOC~~ SOLN
0.0000 [IU] | Freq: Every day | SUBCUTANEOUS | Status: DC
  Administered 2020-08-28: 5 [IU] via SUBCUTANEOUS

## 2020-08-27 MED ORDER — INSULIN GLARGINE 100 UNIT/ML ~~LOC~~ SOLN
25.0000 [IU] | SUBCUTANEOUS | Status: DC
  Administered 2020-08-27: 25 [IU] via SUBCUTANEOUS
  Filled 2020-08-27 (×2): qty 0.25

## 2020-08-27 MED ORDER — INSULIN ASPART 100 UNIT/ML ~~LOC~~ SOLN
3.0000 [IU] | Freq: Three times a day (TID) | SUBCUTANEOUS | Status: DC
  Administered 2020-08-28 (×3): 3 [IU] via SUBCUTANEOUS

## 2020-08-27 MED ORDER — SODIUM CHLORIDE 0.9 % IV BOLUS
2000.0000 mL | Freq: Once | INTRAVENOUS | Status: AC
  Administered 2020-08-27: 2000 mL via INTRAVENOUS

## 2020-08-27 MED ORDER — DEXTROSE 50 % IV SOLN: 0.0000 mL | INTRAVENOUS | Status: DC | PRN

## 2020-08-27 MED ORDER — CHLORHEXIDINE GLUCONATE CLOTH 2 % EX PADS
6.0000 | MEDICATED_PAD | Freq: Every day | CUTANEOUS | Status: DC
  Administered 2020-08-27 – 2020-08-29 (×3): 6 via TOPICAL

## 2020-08-27 MED ORDER — ONDANSETRON HCL 4 MG PO TABS: 4.0000 mg | ORAL_TABLET | Freq: Four times a day (QID) | ORAL | Status: DC | PRN

## 2020-08-27 MED ORDER — INSULIN REGULAR(HUMAN) IN NACL 100-0.9 UT/100ML-% IV SOLN
INTRAVENOUS | Status: DC
Start: 2020-08-27 — End: 2020-08-27
  Administered 2020-08-27: 18 [IU]/h via INTRAVENOUS
  Filled 2020-08-27: qty 100

## 2020-08-27 MED ORDER — ACETAMINOPHEN 325 MG PO TABS
650.0000 mg | ORAL_TABLET | Freq: Four times a day (QID) | ORAL | Status: DC | PRN
  Administered 2020-08-29: 650 mg via ORAL
  Filled 2020-08-27: qty 2

## 2020-08-27 MED ORDER — INSULIN ASPART 100 UNIT/ML ~~LOC~~ SOLN
0.0000 [IU] | Freq: Three times a day (TID) | SUBCUTANEOUS | Status: DC
  Administered 2020-08-28: 5 [IU] via SUBCUTANEOUS
  Administered 2020-08-28 (×2): 9 [IU] via SUBCUTANEOUS
  Administered 2020-08-29: 5 [IU] via SUBCUTANEOUS
  Administered 2020-08-29: 3 [IU] via SUBCUTANEOUS

## 2020-08-27 MED ORDER — CHLORHEXIDINE GLUCONATE 0.12 % MT SOLN
15.0000 mL | Freq: Two times a day (BID) | OROMUCOSAL | Status: DC
  Administered 2020-08-27 – 2020-08-29 (×4): 15 mL via OROMUCOSAL
  Filled 2020-08-27 (×5): qty 15

## 2020-08-27 NOTE — Progress Notes (Signed)
The patient was extremely rude to the medication history tech. She was able to confirm metformin and the two insulins she uses but said it's been 3-4 weeks since she took any of them. She reports she no longer takes Jordan. I added the last filled dates for all meds on her list for which the info was available. Some are > 6 months ago.  Please let pharmacy know if we can be of further assistance.  Charolotte Eke, PharmD Admin. Coordinator of Transitions of Care 08/27/2020,2:42 PM

## 2020-08-27 NOTE — ED Provider Notes (Signed)
[facility]-EMERGENCY DEPT Provider Note   CSN: [id number] Arrival date & time: 08/27/20  6203     History Chief Complaint  Patient presents with  . Hyperglycemia    Pamela Lane is a 60 y.o. female.  Patient with history of insulin-dependent diabetes, adult onset, presents the emergency department for evaluation of high blood sugar. Patient reports uncontrolled blood sugar for the past 2 to 3 days. She states that she has been out of insulin during that time (although triage note states out of insulin for 2 weeks). Patient reports nausea without vomiting. She has had mild abdominal discomfort. Sugars have been running very high. She reports increased thirst and urination. No dysuria or hematuria. No cough or fevers. She denies chest pain or shortness of breath. The onset of this condition was acute. The course is constant. Aggravating factors: none. Alleviating factors: none.          Past Medical History:  Diagnosis Date  . Asthma   . Clostridium difficile colitis   . Diabetes mellitus without complication (HCC)   . Hypertension   . Mental disorder    pt reports 'I have all of them mental disorders'    Patient Active Problem List   Diagnosis Date Noted  . Tobacco abuse 10/04/2019  . DKA, type 2 (HCC) 10/02/2019  . Essential hypertension 10/02/2019  . Bipolar disorder (HCC) 01/17/2019  . Acute pancreatitis 05/15/2016  . AKI (acute kidney injury) (HCC)   . Diabetic ketoacidosis without coma associated with type 2 diabetes mellitus (HCC)   . Hyperglycemia due to type 2 diabetes mellitus (HCC) 01/13/2016  . UTI (urinary tract infection) 01/13/2016  . DKA (diabetic ketoacidoses) 01/08/2016  . Hyponatremia 01/08/2016  . Dehydration 01/08/2016  . Urinary tract infection 01/08/2016  . Upper abdominal pain 01/08/2016  . Nausea & vomiting 01/08/2016    Past Surgical History:  Procedure Laterality Date  . ABDOMINAL HYSTERECTOMY       OB  History   No obstetric history on file.     Family History  Problem Relation Age of Onset  . Hypertension Mother     Social History   Tobacco Use  . Smoking status: Current Every Day Smoker    Packs/day: 0.25    Types: Cigarettes  . Smokeless tobacco: Never Used  Vaping Use  . Vaping Use: Never used  Substance Use Topics  . Alcohol use: Yes    Alcohol/week: 40.0 standard drinks    Types: 40 Cans of beer per week    Comment: 3 big bottles of liquor  . Drug use: Yes    Types: Marijuana, Cocaine    Home Medications Prior to Admission medications   Medication Sig Start Date End Date Taking? Authorizing Provider  albuterol (PROVENTIL HFA;VENTOLIN HFA) 108 (90 Base) MCG/ACT inhaler Inhale 2 puffs into the lungs every 6 (six) hours as needed for wheezing or shortness of breath. 02/18/18   Auburn Bilberry, MD  aspirin EC 81 MG tablet Take 81 mg by mouth daily.    [provider]  atorvastatin (LIPITOR) 40 MG tablet Take 40 mg by mouth at bedtime. 12/25/19   [provider]  buPROPion (WELLBUTRIN SR) 150 MG 12 hr tablet Take 150 mg by mouth 2 (two) times daily.    [provider]  cetirizine (ZYRTEC) 10 MG tablet Take 10 mg by mouth daily. 02/09/20   [provider]  diltiazem (CARDIZEM CD) 120 MG 24 hr capsule Take 1 capsule (120 mg total)  by mouth 2 (two) times a day. Patient taking differently: Take 120 mg by mouth daily.  01/06/19   Ramonita LabGouru, Aruna, MD  folic acid (FOLVITE) 1 MG tablet Take 1 tablet (1 mg total) by mouth daily. 03/08/18   Ramonita LabGouru, Aruna, MD  gabapentin (NEURONTIN) 300 MG capsule Take 300 mg by mouth at bedtime. 05/11/19   [provider]  glipiZIDE (GLUCOTROL XL) 5 MG 24 hr tablet Take 1 tablet (5 mg total) by mouth daily with breakfast. 01/06/19 01/07/20  Gouru, Deanna ArtisAruna, MD  insulin glargine (LANTUS) 100 UNIT/ML Solostar Pen Inject 50 Units into the skin daily. 10/04/19   Dhungel, Nishant, MD  lisinopril (PRINIVIL) 10 MG tablet Take  1 tablet (10 mg total) by mouth daily. 02/18/18   Auburn BilberryPatel, Shreyang, MD  lurasidone (LATUDA) 40 MG TABS tablet Take 1 tablet (40 mg total) by mouth every evening. Patient not taking: Reported on 03/14/2020 01/18/19   Adrian SaranMody, Sital, MD  metFORMIN (GLUCOPHAGE-XR) 500 MG 24 hr tablet Take 500 mg by mouth 2 (two) times daily. 12/09/19   [provider]  Multiple Vitamin (MULTIVITAMIN WITH MINERALS) TABS tablet Take 1 tablet by mouth daily. 03/08/18   Gouru, Deanna ArtisAruna, MD  NOVOLOG FLEXPEN 100 UNIT/ML FlexPen Inject 12 Units into the skin 3 (three) times daily with meals. Patient not taking: Reported on 03/14/2020 10/04/19   Dhungel, Theda BelfastNishant, MD  thiamine 100 MG tablet Take 1 tablet (100 mg total) by mouth daily. 03/08/18   Ramonita LabGouru, Aruna, MD    Allergies    Patient has no known allergies.  Review of Systems   Review of Systems  Constitutional: Negative for fever.  HENT: Negative for rhinorrhea and sore throat.   Eyes: Negative for redness.  Respiratory: Negative for cough.   Cardiovascular: Negative for chest pain.  Gastrointestinal: Positive for nausea. Negative for abdominal pain, diarrhea and vomiting.  Endocrine: Positive for polydipsia and polyuria.  Genitourinary: Positive for frequency. Negative for dysuria, hematuria and urgency.  Musculoskeletal: Negative for myalgias.  Skin: Negative for rash.  Neurological: Negative for headaches.    Physical Exam Updated Vital Signs BP 103/86 (BP Location: Left Arm)   Pulse 85   Temp 97.9 F (36.6 C) (Oral)   Resp 18   Ht 5\' 3"  (1.6 m)   Wt 87 kg   SpO2 98%   BMI 33.98 kg/m   Physical Exam Vitals and nursing note reviewed.  Constitutional:      General: She is not in acute distress.    Appearance: She is well-developed.  HENT:     Head: Normocephalic and atraumatic.     Right Ear: External ear normal.     Left Ear: External ear normal.     Nose: Nose normal.     Mouth/Throat:     Mouth: Mucous membranes are dry.  Eyes:      Conjunctiva/sclera: Conjunctivae normal.  Cardiovascular:     Rate and Rhythm: Regular rhythm. Tachycardia present.     Heart sounds: No murmur heard.     Comments: Mild tachycardia Pulmonary:     Effort: No respiratory distress.     Breath sounds: No wheezing, rhonchi or rales.  Abdominal:     Palpations: Abdomen is soft.     Tenderness: There is no abdominal tenderness. There is no guarding or rebound.  Musculoskeletal:     Cervical back: Normal range of motion and neck supple.     Right lower leg: No edema.     Left lower leg: No edema.  Skin:    General: Skin is warm and dry.     Findings: No rash.  Neurological:     General: No focal deficit present.     Mental Status: She is alert. Mental status is at baseline.     Motor: No weakness.  Psychiatric:        Mood and Affect: Mood normal.     ED Results / Procedures / Treatments   Labs (all labs ordered are listed, but only abnormal results are displayed) Labs Reviewed  BASIC METABOLIC PANEL - Abnormal; Notable for the following components:      Result Value   Sodium 132 (*)    Chloride 97 (*)    CO2 13 (*)    Glucose, Bld 580 (*)    Creatinine, Ser 1.46 (*)    GFR, Estimated 41 (*)    Anion gap 22 (*)    All other components within normal limits  CBC WITH DIFFERENTIAL/PLATELET - Abnormal; Notable for the following components:   WBC 10.6 (*)    Hemoglobin 15.1 (*)    Neutro Abs 7.8 (*)    All other components within normal limits  BLOOD GAS, VENOUS - Abnormal; Notable for the following components:   pH, Ven 7.194 (*)    pCO2, Ven 34.9 (*)    pO2, Ven <31.0 (*)    Bicarbonate 13.0 (*)    Acid-base deficit 14.3 (*)    All other components within normal limits  CBG MONITORING, ED - Abnormal; Notable for the following components:   Glucose-Capillary >600 (*)    All other components within normal limits  CBG MONITORING, ED - Abnormal; Notable for the following components:   Glucose-Capillary 539 (*)    All  other components within normal limits  SARS CORONAVIRUS 2 (TAT 6-24 HRS)  URINALYSIS, ROUTINE W REFLEX MICROSCOPIC  BETA-HYDROXYBUTYRIC ACID    EKG None  Radiology No results found.  Procedures Procedures   Medications Ordered in ED Medications  lactated ringers infusion ( Intravenous New Bag/Given 08/27/20 1123)  insulin regular, human (MYXREDLIN) 100 units/ 100 mL infusion (17 Units/hr Intravenous New Bag/Given 08/27/20 1215)  lactated ringers infusion ( Intravenous New Bag/Given 08/27/20 1216)  dextrose 5 % in lactated ringers infusion (has no administration in time range)  dextrose 50 % solution 0-50 mL (has no administration in time range)  potassium chloride 10 mEq in 100 mL IVPB (has no administration in time range)  sodium chloride 0.9 % bolus 2,000 mL (2,000 mLs Intravenous Bolus 08/27/20 1115)    ED Course  I have reviewed the triage vital signs and the nursing notes.  Pertinent labs & imaging results that were available during my care of the patient were reviewed by me and considered in my medical decision making (see chart for details).  Patient seen and examined. Work-up initiated. Medications ordered.   Vital signs reviewed and are as follows: BP 103/86 (BP Location: Left Arm)   Pulse 85   Temp 97.9 F (36.6 C) (Oral)   Resp 18   Ht 5\' 3"  (1.6 m)   Wt 87 kg   SpO2 98%   BMI 33.98 kg/m   12:17 PM labs consistent with diabetic ketoacidosis.  Bicarb 13, anion gap 22.  Patient with venous pH 7.19.  Patient updated on results.  She is receiving IV fluids and insulin drip.  Potassium was normal.  Repletion per protocol.  Discussed case with Dr. of [facility] hospitalist who will see patient and admit.  CRITICAL  CARE Performed by: Renne Crigler  PA-C Total critical care time: 35 minutes Critical care time was exclusive of separately billable procedures and treating other patients. Critical care was necessary to treat or prevent imminent or life-threatening  deterioration. Critical care was time spent personally by me on the following activities: development of treatment plan with patient and/or surrogate as well as nursing, discussions with consultants, evaluation of patient's response to treatment, examination of patient, obtaining history from patient or surrogate, ordering and performing treatments and interventions, ordering and review of laboratory studies, ordering and review of radiographic studies, pulse oximetry and re-evaluation of patient's condition.     MDM Rules/Calculators/A&P                          Admit.   Final Clinical Impression(s) / ED Diagnoses Final diagnoses:  Diabetic ketoacidosis without coma associated with type 2 diabetes mellitus [facility])    Rx / DC Orders ED Discharge Orders    None       Renne Crigler, PA-C 08/27/20 1218    Derwood Kaplan, MD 08/27/20 1625

## 2020-08-27 NOTE — ED Triage Notes (Signed)
States she has been without her insulin for 2 weeks, took her CBG at home and it was over 900. Endorses nausea and dry heaves, polyuria and polydipsia. Alert and oriented. States she has all the supplies needed, [facility] has just not filled her insulin.

## 2020-08-27 NOTE — ED Notes (Signed)
Pt notified the need of urine sample.

## 2020-08-27 NOTE — ED Notes (Signed)
Notified Nanavati, MD of critical lab values of pH: 7.194, glucose: 580; and pO2 <31.0

## 2020-08-27 NOTE — ED Notes (Signed)
Unable to start IV due to difficulties. RN Whitney Post asked to do Ultrasound IV. RN Whitney Post agreeable and will do as soon as available.

## 2020-08-27 NOTE — Progress Notes (Addendum)
Inpatient Diabetes Program Recommendations  AACE/ADA: New Consensus Statement on Inpatient Glycemic Control (2015)  Target Ranges:  Prepandial:   less than 140 mg/dL      Peak postprandial:   less than 180 mg/dL (1-2 hours)      Critically ill patients:  140 - 180 mg/dL   Lab Results  Component Value Date   GLUCAP 262 (H) 08/27/2020   HGBA1C 12.1 (H) 03/14/2020   Results for TENNILE, STYLES (MRN [id number]) as of 08/27/2020 13:47  Ref. Range 08/27/2020 11:19  Beta-Hydroxybutyric Acid Latest Ref Range: 0.05 - 0.27 mmol/L >8.00 (H)    Diabetes history:  DM2 Outpatient Diabetes medications:  Lantus 55 units daily Novolog 10 units TID Glipizide 5 mg daily Metformin 500 BID Current orders for Inpatient glycemic control:  IV Insulin  Spoke with patient at bedside.  She is from [place] and her current PCP is [facility].  She states she ran out of her insulin about 2 weeks ago.  She states its because of [facility] not calling the insulin into [facility].  We discussed importance of taking her insulin everyday and to follow up with PCP if insulin is not available for pick up at the pharmacy.  She verbalizes understanding.  SHe states she checks her blood sugar everyday and takes her medications as prescribed.  She confirms above home medications.  Denies hypoglycemia.     Will continue to follow while inpatient.  Thank you, Dulce Sellar, RN, BSN Diabetes Coordinator Inpatient Diabetes Program [phone] (team pager from 8a-5p)

## 2020-08-27 NOTE — H&P (Signed)
History and Physical    Pamela Lane CWC:[id number] DOB: 23-Jun-1961 DOA: 08/27/2020  PCP: [facility]  Patient coming from: Home  Chief Complaint: "High sugar"  HPI: Pamela Lane is a 60 y.o. female with medical history significant of IDDM, HTN, HLD. Presenting with polyuria, polydipsia. She reports that she had been out of her insulin for 2 weeks and has had high glucose reading ever since. She has only taken metformin. When asked why she didn't call her PCP for insulin refill, she states that she was only given lancets. She states she has had polyuria to the point where she is unable to sleep. She saw that he glucose was greater than 900 this morning, so she came to the ED. She denies any other alleviating or aggravating factors.   ED Course: She was found to be in DKA. She was started on Endotool. [facility] was called for admission.   Review of Systems:  She denies CP, palpitations, dyspnea, V, fever, ab pain. Review of systems is otherwise negative for all not mentioned in HPI.   PMHx Past Medical History:  Diagnosis Date  . Asthma   . Clostridium difficile colitis   . Diabetes mellitus without complication (HCC)   . Hypertension   . Mental disorder    pt reports 'I have all of them mental disorders'    PSHx Past Surgical History:  Procedure Laterality Date  . ABDOMINAL HYSTERECTOMY      SocHx  reports that she has been smoking cigarettes. She has been smoking about 0.25 packs per day. She has never used smokeless tobacco. She reports current alcohol use of about 40.0 standard drinks of alcohol per week. She reports current drug use. Drugs: Marijuana and Cocaine.  No Known Allergies  FamHx Family History  Problem Relation Age of Onset  . Hypertension Mother     Prior to Admission medications   Medication Sig Start Date End Date Taking? Authorizing Provider  albuterol (PROVENTIL HFA;VENTOLIN HFA) 108 (90 Base) MCG/ACT inhaler Inhale 2  puffs into the lungs every 6 (six) hours as needed for wheezing or shortness of breath. 02/18/18   Auburn Bilberry, MD  aspirin EC 81 MG tablet Take 81 mg by mouth daily.    [provider]  atorvastatin (LIPITOR) 40 MG tablet Take 40 mg by mouth at bedtime. 12/25/19   [provider]  buPROPion (WELLBUTRIN SR) 150 MG 12 hr tablet Take 150 mg by mouth 2 (two) times daily.    [provider]  cetirizine (ZYRTEC) 10 MG tablet Take 10 mg by mouth daily. 02/09/20   [provider]  diltiazem (CARDIZEM CD) 120 MG 24 hr capsule Take 1 capsule (120 mg total) by mouth 2 (two) times a day. Patient taking differently: Take 120 mg by mouth daily.  01/06/19   Ramonita Lab, MD  folic acid (FOLVITE) 1 MG tablet Take 1 tablet (1 mg total) by mouth daily. 03/08/18   Ramonita Lab, MD  gabapentin (NEURONTIN) 300 MG capsule Take 300 mg by mouth at bedtime. 05/11/19   [provider]  glipiZIDE (GLUCOTROL XL) 5 MG 24 hr tablet Take 1 tablet (5 mg total) by mouth daily with breakfast. 01/06/19 01/07/20  Gouru, Deanna Artis, MD  insulin glargine (LANTUS) 100 UNIT/ML Solostar Pen Inject 50 Units into the skin daily. 10/04/19   Dhungel, Nishant, MD  lisinopril (PRINIVIL) 10 MG tablet Take 1 tablet (10 mg total) by mouth daily. 02/18/18   Auburn Bilberry, MD  lurasidone (  LATUDA) 40 MG TABS tablet Take 1 tablet (40 mg total) by mouth every evening. Patient not taking: Reported on 03/14/2020 01/18/19   Adrian Saran, MD  metformin (FORTAMET) 1000 MG (OSM) 24 hr tablet Take 1,000 mg by mouth 2 (two) times daily. 08/05/20   [provider]  metFORMIN (GLUCOPHAGE-XR) 500 MG 24 hr tablet Take 500 mg by mouth 2 (two) times daily. 12/09/19   [provider]  Multiple Vitamin (MULTIVITAMIN WITH MINERALS) TABS tablet Take 1 tablet by mouth daily. 03/08/18   Gouru, Deanna Artis, MD  NOVOLOG FLEXPEN 100 UNIT/ML FlexPen Inject 12 Units into the skin 3 (three) times daily with meals. Patient not taking:  Reported on 03/14/2020 10/04/19   Dhungel, Theda Belfast, MD  thiamine 100 MG tablet Take 1 tablet (100 mg total) by mouth daily. 03/08/18   Ramonita Lab, MD    Physical Exam: Vitals:   08/27/20 1030 08/27/20 1100 08/27/20 1130 08/27/20 1200  BP: 128/77  (!) 130/110 (!) 145/81  Pulse: (!) 101   95  Resp: (!) 24 17 (!) 25 15  Temp:      TempSrc:      SpO2: 97%   99%  Weight:      Height:        General: 60 y.o. female resting in bed in NAD Eyes: PERRL, normal sclera ENMT: Nares patent w/o discharge, orophaynx clear, dentition normal, ears w/o discharge/lesions/ulcers Neck: Supple, trachea midline Cardiovascular: RRR, +S1, S2, no m/g/r, equal pulses throughout Respiratory: CTABL, no w/r/r, normal WOB GI: BS+, NDNT, no masses noted, no organomegaly noted MSK: No e/c/c Skin: No rashes, bruises, ulcerations noted Neuro: A&O x 3, no focal deficits Psyc: Appropriate interaction and affect, calm/cooperative  Labs on Admission: I have personally reviewed following labs and imaging studies  CBC: Recent Labs  Lab 08/27/20 1119  WBC 10.6*  NEUTROABS 7.8*  HGB 15.1*  HCT 45.2  MCV 93.4  PLT 341   Basic Metabolic Panel: Recent Labs  Lab 08/27/20 1119  NA 132*  K 4.8  CL 97*  CO2 13*  GLUCOSE 580*  BUN 20  CREATININE 1.46*  CALCIUM 9.5   GFR: Estimated Creatinine Clearance: 43.4 mL/min (A) (by C-G formula based on SCr of 1.46 mg/dL (H)). Liver Function Tests: No results for input(s): AST, ALT, ALKPHOS, BILITOT, PROT, ALBUMIN in the last 168 hours. No results for input(s): LIPASE, AMYLASE in the last 168 hours. No results for input(s): AMMONIA in the last 168 hours. Coagulation Profile: No results for input(s): INR, PROTIME in the last 168 hours. Cardiac Enzymes: No results for input(s): CKTOTAL, CKMB, CKMBINDEX, TROPONINI in the last 168 hours. BNP (last 3 results) No results for input(s): PROBNP in the last 8760 hours. HbA1C: No results for input(s): HGBA1C in the last 72  hours. CBG: Recent Labs  Lab 08/27/20 0943 08/27/20 1154  GLUCAP >600* 539*   Lipid Profile: No results for input(s): CHOL, HDL, LDLCALC, TRIG, CHOLHDL, LDLDIRECT in the last 72 hours. Thyroid Function Tests: No results for input(s): TSH, T4TOTAL, FREET4, T3FREE, THYROIDAB in the last 72 hours. Anemia Panel: No results for input(s): VITAMINB12, FOLATE, FERRITIN, TIBC, IRON, RETICCTPCT in the last 72 hours. Urine analysis:    Component Value Date/Time   COLORURINE YELLOW 03/13/2020 2300   APPEARANCEUR CLEAR 03/13/2020 2300   LABSPEC 1.016 03/13/2020 2300   PHURINE 5.0 03/13/2020 2300   GLUCOSEU >=500 (A) 03/13/2020 2300   HGBUR MODERATE (A) 03/13/2020 2300   BILIRUBINUR NEGATIVE 03/13/2020 2300   KETONESUR 80 (  A) 03/13/2020 2300   PROTEINUR 100 (A) 03/13/2020 2300   NITRITE NEGATIVE 03/13/2020 2300   LEUKOCYTESUR NEGATIVE 03/13/2020 2300    Radiological Exams on Admission: No results found.  Assessment/Plan DKA IDDM uncontrolled     - admit to inpatient, SDU     - Endotool (insulin gtt, fluids, q4h BMP)     - check A1c     - DM coordinator  HTN     - resume home regimen  AKI     - likely secondary to above     - fluids, follow I&O  Hyponatremia     - pseudohyponatremia     - will correct as glucose corrects  EtOH abuse Tobacco abuse     - counseled against further use  DVT prophylaxis: lovenox  Code Status: FULL  Family Communication: None at bedside.  Consults called: None  Status is: Inpatient  Remains inpatient appropriate because:Inpatient level of care appropriate due to severity of illness   Dispo: The patient is from: Home              Anticipated d/c is to: Home              Anticipated d/c date is: 2 days              Patient currently is not medically stable to d/c.   Difficult to place patient No  Teddy Spike DO [facility]  If 7PM-7AM, please contact night-coverage [email or web address]  08/27/2020, 12:16 PM

## 2020-08-27 NOTE — ED Notes (Signed)
Pt given ice chips and placed on purewick at 70 mmHg.

## 2020-08-28 LAB — BASIC METABOLIC PANEL
Anion gap: 9 (ref 5–15)
BUN: 11 mg/dL (ref 6–20)
CO2: 20 mmol/L — ABNORMAL LOW (ref 22–32)
Calcium: 8.6 mg/dL — ABNORMAL LOW (ref 8.9–10.3)
Chloride: 107 mmol/L (ref 98–111)
Creatinine, Ser: 0.74 mg/dL (ref 0.44–1.00)
GFR, Estimated: >60 mL/min (ref >60)
Glucose, Bld: 188 mg/dL — ABNORMAL HIGH (ref 70–99)
Potassium: 3.6 mmol/L (ref 3.5–5.1)
Sodium: 136 mmol/L (ref 135–145)

## 2020-08-28 LAB — GLUCOSE, CAPILLARY
Glucose-Capillary: 142 mg/dL — ABNORMAL HIGH (ref 70–99)
Glucose-Capillary: 374 mg/dL — ABNORMAL HIGH (ref 70–99)
Glucose-Capillary: 364 mg/dL — ABNORMAL HIGH (ref 70–99)
Glucose-Capillary: 259 mg/dL — ABNORMAL HIGH (ref 70–99)
Glucose-Capillary: 233 mg/dL — ABNORMAL HIGH (ref 70–99)
Glucose-Capillary: 430 mg/dL — ABNORMAL HIGH (ref 70–99)

## 2020-08-28 MED ORDER — INSULIN GLARGINE 100 UNIT/ML ~~LOC~~ SOLN
30.0000 [IU] | SUBCUTANEOUS | Status: DC
  Administered 2020-08-28: 30 [IU] via SUBCUTANEOUS
  Filled 2020-08-28: qty 0.3

## 2020-08-28 NOTE — Progress Notes (Signed)
Orders put in for lab verification for blood glucose per standing orders, lab at bedside. Pt becomes irritable after lab is unsuccessful x1. Asked pt if she would like another person to attempt. Pt refused at this time.

## 2020-08-28 NOTE — Progress Notes (Addendum)
PROGRESS NOTE  Pamela Lane HBZ:[id number] DOB: [birth year] DOA: 08/27/2020 [facility]CP: [facility]  HPI/Recap of past 24 hours: Pamela Lane is a 60 y.o. female with medical history significant of IDDM, HTN, HLD. Presenting with polyuria, polydipsia. She reports that she had been out of her insulin for 2 weeks and has had high glucose reading ever since. She has only taken metformin. When asked why she didn't call her PCP for insulin refill, she states that she was only given lancets. She states she has had polyuria to the point where she is unable to sleep. She saw that he glucose was greater than 900 this morning, so she came to the ED. She denies any other alleviating or aggravating factors.   ED Course: She was found to be in DKA. She was started on Endotool. TRH was called for admission.   08/28/20: Off insulin drip, states she feels better.  Denies significant abdominal pain or nausea.  Adjustment of insulin doses.  Assessment/Plan: Active Problems:   DKA (diabetic ketoacidosis) (HCC)  DKA type II IDDM uncontrolled Currently off insulin drip.  Transition to subcu insulin. Lantus dose increased to 35 units nightly. Continue SSI scale.  Hemoglobin A1c 13.7 on 08/27/2020.  HTN BP is at goal. Continue current regimen. Continue to monitor vital signs.  Nonoliguric AKI, prerenal in the setting of dehydration from hyperglycemia. She is currently back to her baseline creatinine Continue IV fluid hydration Continue to monitor urine output Less urine output recorded 1.5 L in the last 24 hours.  Non-anion gap metabolic acidosis, improving, secondary to DKA type II. Initially presented with serum bicarb of 13 with anion gap of 22 Currently serum bicarb 21 anion gap of 9. Continue IV fluids.  Pseudohyponatremia in the setting of hyperglycemia. Resolved  EtOH abuse No evidence of alcohol withdrawal at time of this visit.  Tobacco abuse Nicotine  patch as needed.  DVT prophylaxis: lovenox  Code Status: FULL  Family Communication: None at bedside.  Consults called: None     Status is: Inpatient    Dispo:  Patient From: Home  Planned Disposition: Home  Expected discharge date: 08/29/2020  Medically stable for discharge: No          Objective: Vitals:   08/28/20 1000 08/28/20 1200 08/28/20 1600 08/28/20 1729  BP:  (!) 147/100  131/86  Pulse:    94  Resp: (!) 21   18  Temp:  97.6 F (36.4 C) 98 F (36.7 C) 98.2 F (36.8 C)  TempSrc:  Oral Oral Oral  SpO2:    98%  Weight:      Height:        Intake/Output Summary (Last 24 hours) at 08/28/2020 1830 Last data filed at 08/28/2020 1541 Gross per 24 hour  Intake 1630.68 ml  Output 1501 ml  Net 129.68 ml   Filed Weights   08/27/20 0945  Weight: 87 kg    Exam:  . General: 60 y.o. year-old female well developed well nourished in no acute distress.  Alert and oriented x3. . Cardiovascular: Regular rate and rhythm with no rubs or gallops.  No thyromegaly or JVD noted.   Marland Kitchen Respiratory: Clear to auscultation with no wheezes or rales. Good inspiratory effort. . Abdomen: Soft nontender nondistended with normal bowel sounds x4 quadrants. . Musculoskeletal: No lower extremity edema. 2/4 pulses in all 4 extremities. . Skin: No ulcerative lesions noted or rashes, . Psychiatry: Mood is appropriate for condition and setting  Data Reviewed: CBC: Recent Labs  Lab 08/27/20 1119  WBC 10.6*  NEUTROABS 7.8*  HGB 15.1*  HCT 45.2  MCV 93.4  PLT 341   Basic Metabolic Panel: Recent Labs  Lab 08/27/20 1119 08/27/20 1532 08/27/20 1953 08/28/20 0249  NA 132* 137 137 136  K 4.8 4.3 3.6 3.6  CL 97* 108 108 107  CO2 13* 14* 19* 20*  GLUCOSE 580* 193* 236* 188*  BUN 20 15 12 11   CREATININE 1.46* 0.96 0.87 0.74  CALCIUM 9.5 8.4* 8.8* 8.6*   GFR: Estimated Creatinine Clearance: 79.1 mL/min (by C-G formula based on SCr of 0.74 mg/dL). Liver Function Tests: No  results for input(s): AST, ALT, ALKPHOS, BILITOT, PROT, ALBUMIN in the last 168 hours. No results for input(s): LIPASE, AMYLASE in the last 168 hours. No results for input(s): AMMONIA in the last 168 hours. Coagulation Profile: No results for input(s): INR, PROTIME in the last 168 hours. Cardiac Enzymes: No results for input(s): CKTOTAL, CKMB, CKMBINDEX, TROPONINI in the last 168 hours. BNP (last 3 results) No results for input(s): PROBNP in the last 8760 hours. HbA1C: Recent Labs    08/27/20 1730  HGBA1C 13.7*   CBG: Recent Labs  Lab 08/28/20 0103 08/28/20 0803 08/28/20 1136 08/28/20 1626 08/28/20 1735  GLUCAP 142* 374* 364* 259* 233*   Lipid Profile: No results for input(s): CHOL, HDL, LDLCALC, TRIG, CHOLHDL, LDLDIRECT in the last 72 hours. Thyroid Function Tests: No results for input(s): TSH, T4TOTAL, FREET4, T3FREE, THYROIDAB in the last 72 hours. Anemia Panel: No results for input(s): VITAMINB12, FOLATE, FERRITIN, TIBC, IRON, RETICCTPCT in the last 72 hours. Urine analysis:    Component Value Date/Time   COLORURINE STRAW (A) 08/27/2020 1231   APPEARANCEUR CLEAR 08/27/2020 1231   LABSPEC 1.017 08/27/2020 1231   PHURINE 5.0 08/27/2020 1231   GLUCOSEU >=500 (A) 08/27/2020 1231   HGBUR NEGATIVE 08/27/2020 1231   BILIRUBINUR NEGATIVE 08/27/2020 1231   KETONESUR 80 (A) 08/27/2020 1231   PROTEINUR NEGATIVE 08/27/2020 1231   NITRITE NEGATIVE 08/27/2020 1231   LEUKOCYTESUR NEGATIVE 08/27/2020 1231   Sepsis Labs: @LABRCNTIP (procalcitonin:4,lacticidven:4)  ) Recent Results (from the past 240 hour(s))  SARS CORONAVIRUS 2 (TAT 6-24 HRS) Nasopharyngeal Nasopharyngeal Swab     Status: None   Collection Time: 08/27/20 12:10 PM   Specimen: Nasopharyngeal Swab  Result Value Ref Range Status   SARS Coronavirus 2 NEGATIVE NEGATIVE Final    Comment: (NOTE) SARS-CoV-2 target nucleic acids are NOT DETECTED.  The SARS-CoV-2 RNA is generally detectable in upper and  lower respiratory specimens during the acute phase of infection. Negative results do not preclude SARS-CoV-2 infection, do not rule out co-infections with other pathogens, and should not be used as the sole basis for treatment or other patient management decisions. Negative results must be combined with clinical observations, patient history, and epidemiological information. The expected result is Negative.  Fact Sheet for Patients:  Fact Sheet for Healthcare Providers: 08/29/20  This test is not yet approved or cleared by the [employer] FDA and  has been authorized for detection and/or diagnosis of SARS-CoV-2 by FDA under an Emergency Use Authorization (EUA). This EUA will remain  in effect (meaning this test can be used) for the duration of the COVID-19 declaration under Se ction 564(b)(1) of the Act, 21 U.S.C. section 360bbb-3(b)(1), unless the authorization is terminated or revoked sooner.  Performed at [facility], [address] [place], [address] [place]   MRSA PCR Screening  Status: None   Collection Time: 08/27/20  5:28 PM   Specimen: Nasal Mucosa; Nasopharyngeal  Result Value Ref Range Status   MRSA by PCR NEGATIVE NEGATIVE Final    Comment:        The GeneXpert MRSA Assay (FDA approved for NASAL specimens only), is one component of a comprehensive MRSA colonization surveillance program. It is not intended to diagnose MRSA infection nor to guide or monitor treatment for MRSA infections. Performed at [facility], [address], [place], [place] [address]       Studies: No results found.  Scheduled Meds: . chlorhexidine  15 mL Mouth Rinse BID  . Chlorhexidine Gluconate Cloth  6 each Topical Daily  . enoxaparin (LOVENOX) injection  40 mg Subcutaneous Q24H  . insulin aspart  0-5 Units Subcutaneous QHS  . insulin aspart  0-9 Units Subcutaneous TID WC   . insulin aspart  3 Units Subcutaneous TID WC  . insulin glargine  25 Units Subcutaneous Q24H  . mouth rinse  15 mL Mouth Rinse q12n4p    Continuous Infusions: . lactated ringers 75 mL/hr at 08/28/20 1735     LOS: 1 day     Darlin Drop, MD [facility] Pager [phone]  If 7PM-7AM, please contact night-coverage [email or web address] Password [facility] 08/28/2020, 6:30 PM

## 2020-08-28 NOTE — Progress Notes (Signed)
Notified by NT, pt FSBG 430. Notified MD Anna Genre, per MD give scheduled Lantus as ordered and 5U of Novolog for bedtime scale.

## 2020-08-28 NOTE — Progress Notes (Signed)
Pt's belongings were packed up. Pt was placed in a wheelchair and was taken off the monitor since level of care was med surg. Pt was stable upon leaving unit to 6E. Pt was placed in recliner on 6E. Staff was notified of pt's arrival and pt was stable when we left.

## 2020-08-28 NOTE — TOC Initial Note (Signed)
Transition of Care [facility]) - Initial/Assessment Note    Patient Details  Name: Pamela Lane MRN: [id number] Date of Birth: 1961-05-15  Transition of [facility]) CM/SW Contact:    Golda Acre, RN Phone Number: 08/28/2020, 8:29 AM  Clinical Narrative:                 60 y.o. female with medical history significant of IDDM, HTN, HLD. Presenting with polyuria, polydipsia. She reports that she had been out of her insulin for 2 weeks and has had high glucose reading ever since. She has only taken metformin. When asked why she didn't call her PCP for insulin refill, she states that she was only given lancets. She states she has had polyuria to the point where she is unable to sleep. She saw that he glucose was greater than 900 this morning, so she came to the ED. She denies any other alleviating or aggravating factors.   ED Course: She was found to be in DKA. She was started on Endotool PLAN: TO RETURN TO HOME/ LIVES BY HERSELF HAS A DAUGHTER FOR SUPPORT Expected Discharge Plan: Home/Self Care Barriers to Discharge: Continued Medical Work up   Patient Goals and CMS Choice Patient states their goals for this hospitalization and ongoing recovery are:: to go  home CMS Medicare.gov Compare Post Acute Care list provided to:: Patient    Expected Discharge Plan and Services Expected Discharge Plan: Home/Self Care   Discharge Planning Services: CM Consult   Living arrangements for the past 2 months: Single Family Home                                      Prior Living Arrangements/Services Living arrangements for the past 2 months: Single Family Home Lives with:: Self Patient language and need for interpreter reviewed:: Yes Do you feel safe going back to the place where you live?: Yes      Need for Family Participation in Patient Care: Yes (Comment) Care giver support system in place?: Yes (comment)   Criminal Activity/Legal Involvement Pertinent to Current  Situation/Hospitalization: No - Comment as needed  Activities of Daily Living Home Assistive Devices/Equipment: CBG Meter,Eyeglasses ADL Screening (condition at time of admission) Patient's cognitive ability adequate to safely complete daily activities?: Yes Is the patient deaf or have difficulty hearing?: No Does the patient have difficulty seeing, even when wearing glasses/contacts?: No Does the patient have difficulty concentrating, remembering, or making decisions?: No Patient able to express need for assistance with ADLs?: Yes Does the patient have difficulty dressing or bathing?: No Independently performs ADLs?: Yes (appropriate for developmental age) Does the patient have difficulty walking or climbing stairs?: No Weakness of Legs: None Weakness of Arms/Hands: None  Permission Sought/Granted                  Emotional Assessment Appearance:: Appears stated age Attitude/Demeanor/Rapport: Engaged Affect (typically observed): Calm Orientation: : Oriented to Place,Oriented to Self,Oriented to  Time,Oriented to Situation Alcohol / Substance Use: Not Applicable Psych Involvement: No (comment)  Admission diagnosis:  DKA (diabetic ketoacidosis) (HCC) [E11.10] Diabetic ketoacidosis without coma associated with type 2 diabetes mellitus (HCC) [E11.10] Patient Active Problem List   Diagnosis Date Noted  . DKA (diabetic ketoacidosis) (HCC) 08/27/2020  . Tobacco abuse 10/04/2019  . DKA, type 2 (HCC) 10/02/2019  . Essential hypertension 10/02/2019  . Bipolar disorder (HCC) 01/17/2019  . Acute pancreatitis 05/15/2016  .  AKI (acute kidney injury) (HCC)   . Diabetic ketoacidosis without coma associated with type 2 diabetes mellitus (HCC)   . Hyperglycemia due to type 2 diabetes mellitus (HCC) 01/13/2016  . UTI (urinary tract infection) 01/13/2016  . DKA (diabetic ketoacidoses) 01/08/2016  . Hyponatremia 01/08/2016  . Dehydration 01/08/2016  . Urinary tract infection 01/08/2016   . Upper abdominal pain 01/08/2016  . Nausea & vomiting 01/08/2016   PCP:  [facility]:   [facility] [address] ([address]), [place] - [address] [place]) [place] [address] Phone: [phone] Fax: [phone]  [facility] [place], [place] - [address] [place] [address] Phone: [phone] Fax: [phone]     Social Determinants of Health (SDOH) Interventions    Readmission Risk Interventions Readmission Risk Prevention Plan 10/04/2019  Transportation Screening Complete  PCP or Specialist Appt within 3-5 Days Complete  HRI or Home Care Consult Complete  Medication Review (RN Care Manager) Complete  Some recent data might be hidden

## 2020-08-28 NOTE — Progress Notes (Signed)
Inpatient Diabetes Program Recommendations  AACE/ADA: New Consensus Statement on Inpatient Glycemic Control (2015)  Target Ranges:  Prepandial:   less than 140 mg/dL      Peak postprandial:   less than 180 mg/dL (1-2 hours)      Critically ill patients:  140 - 180 mg/dL   Lab Results  Component Value Date   GLUCAP 374 (H) 08/28/2020   HGBA1C 13.7 (H) 08/27/2020    Review of Glycemic Control Results for HORTENSIA, DUFFIN (MRN [id number]) as of 08/28/2020 08:49  Ref. Range 08/28/2020 08:03  Glucose-Capillary Latest Ref Range: 70 - 99 mg/dL 270 (H)   Diabetes history:  DM2 Outpatient Diabetes medications:  Lantus 55 units daily Novolog 10 units TID Glipizide 5 mg daily Metformin 500 BID Current orders for Inpatient glycemic control:  Lantus 25 units QHS Novolog 0-9 units TID & 0-5 units QHS Novolog 3 units TID with meals  Inpatient Diabetes Program Recommendations:    Lantus 35 units QHS Novolog 0-15 units TID  Will continue to follow while inpatient.  Thank you, Dulce Sellar, RN, BSN Diabetes Coordinator Inpatient Diabetes Program [phone] (team pager from 8a-5p)

## 2020-08-29 ENCOUNTER — Other Ambulatory Visit (HOSPITAL_COMMUNITY): Payer: Self-pay | Admitting: Internal Medicine

## 2020-08-29 LAB — BASIC METABOLIC PANEL
Anion gap: 5 (ref 5–15)
Anion gap: 10 (ref 5–15)
BUN: 10 mg/dL (ref 6–20)
BUN: 11 mg/dL (ref 6–20)
CO2: 27 mmol/L (ref 22–32)
CO2: 24 mmol/L (ref 22–32)
Calcium: 8.3 mg/dL — ABNORMAL LOW (ref 8.9–10.3)
Calcium: 8.9 mg/dL (ref 8.9–10.3)
Chloride: 104 mmol/L (ref 98–111)
Chloride: 103 mmol/L (ref 98–111)
Creatinine, Ser: 0.61 mg/dL (ref 0.44–1.00)
Creatinine, Ser: 0.81 mg/dL (ref 0.44–1.00)
GFR, Estimated: >60 mL/min (ref >60)
GFR, Estimated: >60 mL/min (ref >60)
Glucose, Bld: 288 mg/dL — ABNORMAL HIGH (ref 70–99)
Glucose, Bld: 98 mg/dL (ref 70–99)
Potassium: 3.4 mmol/L — ABNORMAL LOW (ref 3.5–5.1)
Potassium: 4.5 mmol/L (ref 3.5–5.1)
Sodium: 136 mmol/L (ref 135–145)
Sodium: 137 mmol/L (ref 135–145)

## 2020-08-29 LAB — GLUCOSE, CAPILLARY
Glucose-Capillary: 283 mg/dL — ABNORMAL HIGH (ref 70–99)
Glucose-Capillary: 266 mg/dL — ABNORMAL HIGH (ref 70–99)
Glucose-Capillary: 204 mg/dL — ABNORMAL HIGH (ref 70–99)

## 2020-08-29 LAB — CBC
HCT: 36.3 % (ref 36.0–46.0)
Hemoglobin: 12.7 g/dL (ref 12.0–15.0)
MCH: 31.4 pg (ref 26.0–34.0)
MCHC: 35 g/dL (ref 30.0–36.0)
MCV: 89.6 fL (ref 80.0–100.0)
Platelets: 232 10*3/uL (ref 150–400)
RBC: 4.05 MIL/uL (ref 3.87–5.11)
RDW: 12.9 % (ref 11.5–15.5)
WBC: 4.7 10*3/uL (ref 4.0–10.5)
nRBC: 0 % (ref 0.0–0.2)

## 2020-08-29 LAB — MAGNESIUM
Magnesium: 1.5 mg/dL — ABNORMAL LOW (ref 1.7–2.4)
Magnesium: 2.6 mg/dL — ABNORMAL HIGH (ref 1.7–2.4)

## 2020-08-29 MED ORDER — MAGNESIUM SULFATE 4 GM/100ML IV SOLN
4.0000 g | Freq: Once | INTRAVENOUS | Status: AC
  Administered 2020-08-29: 4 g via INTRAVENOUS
  Filled 2020-08-29: qty 100

## 2020-08-29 MED ORDER — GABAPENTIN 100 MG PO CAPS
100.0000 mg | ORAL_CAPSULE | Freq: Two times a day (BID) | ORAL | Status: DC
  Administered 2020-08-29: 100 mg via ORAL
  Filled 2020-08-29: qty 1

## 2020-08-29 MED ORDER — DILTIAZEM HCL ER COATED BEADS 120 MG PO CP24: 120.0000 mg | ORAL_CAPSULE | Freq: Every day | ORAL | 0 refills | Status: DC

## 2020-08-29 MED ORDER — ATORVASTATIN CALCIUM 40 MG PO TABS: 40.0000 mg | ORAL_TABLET | Freq: Every day | ORAL | 0 refills | Status: DC

## 2020-08-29 MED ORDER — INSULIN ASPART 100 UNIT/ML ~~LOC~~ SOLN
7.0000 [IU] | Freq: Three times a day (TID) | SUBCUTANEOUS | Status: DC
  Administered 2020-08-29: 7 [IU] via SUBCUTANEOUS

## 2020-08-29 MED ORDER — INSULIN ASPART 100 UNIT/ML ~~LOC~~ SOLN
12.0000 [IU] | Freq: Three times a day (TID) | SUBCUTANEOUS | Status: DC
  Administered 2020-08-29: 12 [IU] via SUBCUTANEOUS

## 2020-08-29 MED ORDER — METFORMIN HCL ER 500 MG PO TB24: 1000.0000 mg | ORAL_TABLET | Freq: Every day | ORAL | 0 refills | Status: DC

## 2020-08-29 MED ORDER — INSULIN GLARGINE 100 UNIT/ML SOLOSTAR PEN: 50.0000 [IU] | PEN_INJECTOR | Freq: Every day | SUBCUTANEOUS | 0 refills | Status: DC

## 2020-08-29 MED ORDER — DILTIAZEM HCL ER COATED BEADS 120 MG PO CP24
120.0000 mg | ORAL_CAPSULE | Freq: Every day | ORAL | Status: DC
  Administered 2020-08-29: 120 mg via ORAL
  Filled 2020-08-29: qty 1

## 2020-08-29 MED ORDER — POTASSIUM CHLORIDE CRYS ER 20 MEQ PO TBCR
40.0000 meq | EXTENDED_RELEASE_TABLET | Freq: Once | ORAL | Status: AC
  Administered 2020-08-29: 40 meq via ORAL
  Filled 2020-08-29: qty 2

## 2020-08-29 MED ORDER — NOVOLOG FLEXPEN 100 UNIT/ML ~~LOC~~ SOPN: 10.0000 [IU] | PEN_INJECTOR | Freq: Three times a day (TID) | SUBCUTANEOUS | 0 refills | Status: DC

## 2020-08-29 MED ORDER — INSULIN GLARGINE 100 UNIT/ML ~~LOC~~ SOLN: 35.0000 [IU] | SUBCUTANEOUS | Status: DC

## 2020-08-29 MED ORDER — GABAPENTIN 100 MG PO CAPS: 100.0000 mg | ORAL_CAPSULE | Freq: Two times a day (BID) | ORAL | 0 refills | Status: DC

## 2020-08-29 MED FILL — GABAPENTIN 100 MG CAPSULE: 100 | 90 days supply | Qty: 180 | Fill #0

## 2020-08-29 MED FILL — DILTIAZEM HCL ER COATED BEA: 120 | 30 days supply | Qty: 30 | Fill #0

## 2020-08-29 MED FILL — NOVOLOG FLEXPEN SYRINGE: 100 | 90 days supply | Qty: 27 | Fill #0

## 2020-08-29 MED FILL — LANTUS SOLOSTAR 100 UNITS/M: 100 | 90 days supply | Qty: 45 | Fill #0

## 2020-08-29 MED FILL — metFORMIN HCL ER 500 MG TB2: 500 | 90 days supply | Qty: 180 | Fill #0

## 2020-08-29 MED FILL — ATORVASTATIN 40 MG TABLET: 40 | 90 days supply | Qty: 90 | Fill #0

## 2020-08-29 NOTE — Discharge Summary (Signed)
Discharge Summary  Pamela Lane:[id number] DOB: January 31, 1961  [facility]P: [facility]  Admit date: 08/27/2020 Discharge date: 08/29/2020  Time spent: 35 minutes.  Recommendations for Outpatient Follow-up:  1. Follow-up with your primary care provider within a week. 2. Take your medications as prescribed.  Discharge Diagnoses:  Active Hospital Problems   Diagnosis Date Noted  . DKA (diabetic ketoacidosis) (HCC) 08/27/2020    Resolved Hospital Problems  No resolved problems to display.    Discharge Condition: Stable.  Diet recommendation: Heart healthy carb modified diet.  Vitals:   08/29/20 0123 08/29/20 0532  BP: 133/73 (!) 148/92  Pulse: 94 87  Resp: 18 14  Temp: 97.9 F (36.6 C) 97.7 F (36.5 C)  SpO2: 99% 99%    History of present illness:  Pamela [place] 60 y.o.femalewith medical history significant ofIDDM2, essential HTN, HLD. Presenting with polyuria, polydipsia. She reports that she had been out of her insulin for 2 weeks and has had high glucose reading ever since. She was only taking her home metformin. When asked why she didn't call her PCP for insulin refill, she states that she was only given lancets. She states she has had polyuria to the point where she is unable to sleep. She saw that her glucose was greater than 900 the morning of her admission, so she came to the ED for further evaluation.  Work-up revealed DKA type II likely secondary to medication noncompliance.  No evidence of active infective process.  She completed DKA protocol and was transitioned to subcu insulin. Hemoglobin A1c 13.7 on 08/27/2020.   08/29/20:  Seen at her bedside.  She reports mild nausea, no abdominal pain.  Lab work significant for hypokalemia and hypomagnesemia which are being repleted.  TOC consulted to assist with medications.  Hospital Course:  Active Problems:   DKA (diabetic ketoacidosis) (HCC)  DKA type II Presented with beta  hydroxybutyrate acid >8, bicarb 13, anion gap 22, and serum glucose 580. Hemoglobin A1c 13.7 on 08/27/2020. Completed DKA protocol and transitioned to subcu insulin. Resume home regimen Lantus 50 units daily, NovoLog 10 units 3 times daily, and Metformin 1000 mg daily. Follow-up with your PCP within a week  Hypokalemia Potassium 3.4 Repleted orally Recheck at 1500  Hypomagnesemia Magnesium 1.5 Repleted intravenously, IV magnesium 4 g x 1 dose. Recheck at 1500  HTN BP is stable. Continue home p.o. Cardizem at 120 mg daily. Follow-up with your PCP.  Resolved nonoliguric AKI, prerenal in the setting of dehydration from hyperglycemia. She is currently back to her baseline creatinine Creatinine 0.61 with GFR greater than 60. Received IV fluid hydration and has had good urine output.  Resolved non-anion gap metabolic acidosis secondary to DKA type II and AKI. Initially presented with serum bicarb of 13 with anion gap of 22 Currently serum bicarb 27 anion gap of 5. Received IV fluids.  Resolved pseudohyponatremia in the setting of hyperglycemia.  EtOH/tobacco use disorder. Recommend complete cessation. No evidence of alcohol withdrawal at time of this visit.    Code Status:FULL      Procedures:  None.  Consultations:  None.  Discharge Exam: BP (!) 148/92 (BP Location: Left Arm)   Pulse 87   Temp 97.7 F (36.5 C) (Oral)   Resp 14   Ht 5\' 3"  (1.6 m)   Wt 87 kg   SpO2 99%   BMI 33.98 kg/m  . General: 60 y.o. year-old female well developed well nourished in no acute distress.  Alert  and oriented x3. . Cardiovascular: Regular rate and rhythm with no rubs or gallops.  No thyromegaly or JVD noted.   Marland Kitchen Respiratory: Clear to auscultation with no wheezes or rales. Good inspiratory effort. . Abdomen: Soft nontender nondistended with normal bowel sounds x4 quadrants. . Musculoskeletal: No lower extremity edema. 2/4 pulses in all 4 extremities. . Skin: No  ulcerative lesions noted or rashes . Psychiatry: Mood is appropriate for condition and setting  Discharge Instructions You were cared for by a hospitalist during your hospital stay. If you have any questions about your discharge medications or the care you received while you were in the hospital after you are discharged, you can call the unit and asked to speak with the hospitalist on call if the hospitalist that took care of you is not available. Once you are discharged, your primary care physician will handle any further medical issues. Please note that NO REFILLS for any discharge medications will be authorized once you are discharged, as it is imperative that you return to your primary care physician (or establish a relationship with a primary care physician if you do not have one) for your aftercare needs so that they can reassess your need for medications and monitor your lab values.   Allergies as of 08/29/2020   No Known Allergies     Medication List    STOP taking these medications   glipiZIDE 5 MG 24 hr tablet Commonly known as: GLUCOTROL XL   lisinopril 10 MG tablet Commonly known as: Prinivil   lurasidone 40 MG Tabs tablet Commonly known as: LATUDA     TAKE these medications   albuterol 108 (90 Base) MCG/ACT inhaler Commonly known as: VENTOLIN HFA Inhale 2 puffs into the lungs every 6 (six) hours as needed for wheezing or shortness of breath.   aspirin EC 81 MG tablet Take 81 mg by mouth daily.   atorvastatin 40 MG tablet Commonly known as: LIPITOR Take 1 tablet (40 mg total) by mouth at bedtime.   buPROPion 150 MG 12 hr tablet Commonly known as: ZYBAN Take 150 mg by mouth 2 (two) times daily.   cetirizine 10 MG tablet Commonly known as: ZYRTEC Take 10 mg by mouth daily.   diltiazem 120 MG 24 hr capsule Commonly known as: CARDIZEM CD Take 1 capsule (120 mg total) by mouth daily.   folic acid 1 MG tablet Commonly known as: FOLVITE Take 1 tablet (1 mg total)  by mouth daily.   gabapentin 100 MG capsule Commonly known as: NEURONTIN Take 1 capsule (100 mg total) by mouth 2 (two) times daily. What changed:   medication strength  how much to take  when to take this   insulin glargine 100 UNIT/ML Solostar Pen Commonly known as: LANTUS Inject 50 Units into the skin daily. What changed: how much to take   metFORMIN 500 MG 24 hr tablet Commonly known as: GLUCOPHAGE-XR Take 2 tablets (1,000 mg total) by mouth daily with breakfast. What changed: when to take this   multivitamin with minerals Tabs tablet Take 1 tablet by mouth daily.   NovoLOG FlexPen 100 UNIT/ML FlexPen Generic drug: insulin aspart Inject 10 Units into the skin 3 (three) times daily with meals.   thiamine 100 MG tablet Take 1 tablet (100 mg total) by mouth daily.      No Known Allergies    The results of significant diagnostics from this hospitalization (including imaging, microbiology, ancillary and laboratory) are listed below for reference.    Significant  Diagnostic Studies: No results found.  Microbiology: Recent Results (from the past 240 hour(s))  SARS CORONAVIRUS 2 (TAT 6-24 HRS) Nasopharyngeal Nasopharyngeal Swab     Status: None   Collection Time: 08/27/20 12:10 PM   Specimen: Nasopharyngeal Swab  Result Value Ref Range Status   SARS Coronavirus 2 NEGATIVE NEGATIVE Final    Comment: (NOTE) SARS-CoV-2 target nucleic acids are NOT DETECTED.  The SARS-CoV-2 RNA is generally detectable in upper and lower respiratory specimens during the acute phase of infection. Negative results do not preclude SARS-CoV-2 infection, do not rule out co-infections with other pathogens, and should not be used as the sole basis for treatment or other patient management decisions. Negative results must be combined with clinical observations, patient history, and epidemiological information. The expected result is Negative.  Fact Sheet for  Patients: HairSlick.no  Fact Sheet for Healthcare Providers: quierodirigir.com  This test is not yet approved or cleared by the [place] FDA and  has been authorized for detection and/or diagnosis of SARS-CoV-2 by FDA under an Emergency Use Authorization (EUA). This EUA will remain  in effect (meaning this test can be used) for the duration of the COVID-19 declaration under Se ction 564(b)(1) of the Act, 21 U.S.C. section 360bbb-3(b)(1), unless the authorization is terminated or revoked sooner.  Performed at [facility], [address] [place], [place] [address]   MRSA PCR Screening     Status: None   Collection Time: 08/27/20  5:28 PM   Specimen: Nasal Mucosa; Nasopharyngeal  Result Value Ref Range Status   MRSA by PCR NEGATIVE NEGATIVE Final    Comment:        The GeneXpert MRSA Assay (FDA approved for NASAL specimens only), is one component of a comprehensive MRSA colonization surveillance program. It is not intended to diagnose MRSA infection nor to guide or monitor treatment for MRSA infections. Performed at [facility], [address], [place], [place] [address]      Labs: Basic Metabolic Panel: Recent Labs  Lab 08/27/20 1119 08/27/20 1532 08/27/20 1953 08/28/20 0249 08/29/20 0458  NA 132* 137 137 136 136  K 4.8 4.3 3.6 3.6 3.4*  CL 97* 108 108 107 104  CO2 13* 14* 19* 20* 27  GLUCOSE 580* 193* 236* 188* 288*  BUN 20 15 12 11 10   CREATININE 1.46* 0.96 0.87 0.74 0.61  CALCIUM 9.5 8.4* 8.8* 8.6* 8.3*  MG  --   --   --   --  1.5*   Liver Function Tests: No results for input(s): AST, ALT, ALKPHOS, BILITOT, PROT, ALBUMIN in the last 168 hours. No results for input(s): LIPASE, AMYLASE in the last 168 hours. No results for input(s): AMMONIA in the last 168 hours. CBC: Recent Labs  Lab 08/27/20 1119 08/29/20 0458  WBC 10.6* 4.7  NEUTROABS 7.8*  --   HGB 15.1* 12.7   HCT 45.2 36.3  MCV 93.4 89.6  PLT 341 232   Cardiac Enzymes: No results for input(s): CKTOTAL, CKMB, CKMBINDEX, TROPONINI in the last 168 hours. BNP: BNP (last 3 results) No results for input(s): BNP in the last 8760 hours.  ProBNP (last 3 results) No results for input(s): PROBNP in the last 8760 hours.  CBG: Recent Labs  Lab 08/28/20 1136 08/28/20 1626 08/28/20 1735 08/28/20 2158 08/29/20 0258  GLUCAP 364* 259* 233* 430* 283*       Signed:  08/31/20, MD [facility] 08/29/2020, 10:39 AM

## 2020-10-07 ENCOUNTER — Other Ambulatory Visit: Payer: Self-pay

## 2020-10-07 ENCOUNTER — Inpatient Hospital Stay
Admission: EM | Admit: 2020-10-07 | Discharge: 2020-10-11 | DRG: 637 | Disposition: A | Discharge status: Home / Self Care | Payer: Medicaid Other | Attending: Internal Medicine | Admitting: Internal Medicine

## 2020-10-07 ENCOUNTER — Inpatient Hospital Stay: Payer: Medicaid Other

## 2020-10-07 DIAGNOSIS — I119 Hypertensive heart disease without heart failure: Secondary | ICD-10-CM | POA: Diagnosis present

## 2020-10-07 DIAGNOSIS — N179 Acute kidney failure, unspecified: Secondary | ICD-10-CM | POA: Diagnosis present

## 2020-10-07 DIAGNOSIS — E1110 Type 2 diabetes mellitus with ketoacidosis without coma: Principal | ICD-10-CM | POA: Diagnosis present

## 2020-10-07 DIAGNOSIS — I471 Supraventricular tachycardia: Secondary | ICD-10-CM | POA: Diagnosis present

## 2020-10-07 DIAGNOSIS — D72829 Elevated white blood cell count, unspecified: Secondary | ICD-10-CM | POA: Diagnosis not present

## 2020-10-07 DIAGNOSIS — Y92230 Patient room in hospital as the place of occurrence of the external cause: Secondary | ICD-10-CM | POA: Diagnosis not present

## 2020-10-07 DIAGNOSIS — E1111 Type 2 diabetes mellitus with ketoacidosis with coma: Secondary | ICD-10-CM | POA: Diagnosis not present

## 2020-10-07 DIAGNOSIS — E785 Hyperlipidemia, unspecified: Secondary | ICD-10-CM | POA: Diagnosis present

## 2020-10-07 DIAGNOSIS — Z794 Long term (current) use of insulin: Secondary | ICD-10-CM

## 2020-10-07 DIAGNOSIS — F319 Bipolar disorder, unspecified: Secondary | ICD-10-CM | POA: Diagnosis present

## 2020-10-07 DIAGNOSIS — E871 Hypo-osmolality and hyponatremia: Secondary | ICD-10-CM | POA: Diagnosis present

## 2020-10-07 DIAGNOSIS — G9341 Metabolic encephalopathy: Secondary | ICD-10-CM | POA: Diagnosis present

## 2020-10-07 DIAGNOSIS — Z7984 Long term (current) use of oral hypoglycemic drugs: Secondary | ICD-10-CM | POA: Diagnosis not present

## 2020-10-07 DIAGNOSIS — Z8249 Family history of ischemic heart disease and other diseases of the circulatory system: Secondary | ICD-10-CM | POA: Diagnosis not present

## 2020-10-07 DIAGNOSIS — R0603 Acute respiratory distress: Secondary | ICD-10-CM | POA: Diagnosis present

## 2020-10-07 DIAGNOSIS — I160 Hypertensive urgency: Secondary | ICD-10-CM | POA: Diagnosis present

## 2020-10-07 DIAGNOSIS — R0602 Shortness of breath: Secondary | ICD-10-CM

## 2020-10-07 DIAGNOSIS — E0811 Diabetes mellitus due to underlying condition with ketoacidosis with coma: Secondary | ICD-10-CM | POA: Diagnosis not present

## 2020-10-07 DIAGNOSIS — Z9119 Patient's noncompliance with other medical treatment and regimen: Secondary | ICD-10-CM

## 2020-10-07 DIAGNOSIS — R197 Diarrhea, unspecified: Secondary | ICD-10-CM | POA: Diagnosis present

## 2020-10-07 DIAGNOSIS — J9601 Acute respiratory failure with hypoxia: Secondary | ICD-10-CM | POA: Diagnosis not present

## 2020-10-07 DIAGNOSIS — E876 Hypokalemia: Secondary | ICD-10-CM | POA: Diagnosis present

## 2020-10-07 DIAGNOSIS — J45909 Unspecified asthma, uncomplicated: Secondary | ICD-10-CM | POA: Diagnosis present

## 2020-10-07 DIAGNOSIS — E1142 Type 2 diabetes mellitus with diabetic polyneuropathy: Secondary | ICD-10-CM | POA: Diagnosis present

## 2020-10-07 DIAGNOSIS — F17210 Nicotine dependence, cigarettes, uncomplicated: Secondary | ICD-10-CM | POA: Diagnosis present

## 2020-10-07 DIAGNOSIS — Z20822 Contact with and (suspected) exposure to covid-19: Secondary | ICD-10-CM | POA: Diagnosis present

## 2020-10-07 DIAGNOSIS — R079 Chest pain, unspecified: Secondary | ICD-10-CM

## 2020-10-07 DIAGNOSIS — W19XXXA Unspecified fall, initial encounter: Secondary | ICD-10-CM | POA: Diagnosis not present

## 2020-10-07 DIAGNOSIS — E860 Dehydration: Secondary | ICD-10-CM | POA: Diagnosis present

## 2020-10-07 DIAGNOSIS — R571 Hypovolemic shock: Secondary | ICD-10-CM | POA: Diagnosis present

## 2020-10-07 DIAGNOSIS — Z01818 Encounter for other preprocedural examination: Secondary | ICD-10-CM

## 2020-10-07 DIAGNOSIS — F1410 Cocaine abuse, uncomplicated: Secondary | ICD-10-CM | POA: Diagnosis not present

## 2020-10-07 DIAGNOSIS — Z79899 Other long term (current) drug therapy: Secondary | ICD-10-CM

## 2020-10-07 DIAGNOSIS — Z7982 Long term (current) use of aspirin: Secondary | ICD-10-CM

## 2020-10-07 DIAGNOSIS — Z4659 Encounter for fitting and adjustment of other gastrointestinal appliance and device: Secondary | ICD-10-CM

## 2020-10-07 LAB — COMPREHENSIVE METABOLIC PANEL
ALT: 13 U/L (ref 0–44)
AST: 14 U/L — ABNORMAL LOW (ref 15–41)
Albumin: 4.2 g/dL (ref 3.5–5.0)
Alkaline Phosphatase: 98 U/L (ref 38–126)
BUN: 19 mg/dL (ref 6–20)
CO2: <7 mmol/L — ABNORMAL LOW (ref 22–32)
Calcium: 9 mg/dL (ref 8.9–10.3)
Chloride: 99 mmol/L (ref 98–111)
Creatinine, Ser: 1.77 mg/dL — ABNORMAL HIGH (ref 0.44–1.00)
GFR, Estimated: 33 mL/min — ABNORMAL LOW (ref >60)
Glucose, Bld: 642 mg/dL (ref 70–99)
Potassium: 5.1 mmol/L (ref 3.5–5.1)
Sodium: 129 mmol/L — ABNORMAL LOW (ref 135–145)
Total Bilirubin: 1.5 mg/dL — ABNORMAL HIGH (ref 0.3–1.2)
Total Protein: 7.9 g/dL (ref 6.5–8.1)

## 2020-10-07 LAB — CBG MONITORING, ED: Glucose-Capillary: 540 mg/dL (ref 70–99)

## 2020-10-07 LAB — BLOOD GAS, VENOUS
Acid-base deficit: 25.7 mmol/L — ABNORMAL HIGH (ref 0.0–2.0)
Bicarbonate: 6.4 mmol/L — ABNORMAL LOW (ref 20.0–28.0)
O2 Saturation: 21.3 %
Patient temperature: 37
pCO2, Ven: 31 mmHg — ABNORMAL LOW (ref 44.0–60.0)
pH, Ven: 6.92 — CL (ref 7.250–7.430)
pO2, Ven: <31 mmHg — CL (ref 32.0–45.0)

## 2020-10-07 LAB — CBC WITH DIFFERENTIAL/PLATELET
Abs Immature Granulocytes: 0.18 10*3/uL — ABNORMAL HIGH (ref 0.00–0.07)
Basophils Absolute: 0.1 10*3/uL (ref 0.0–0.1)
Basophils Relative: 0 %
Eosinophils Absolute: 0 10*3/uL (ref 0.0–0.5)
Eosinophils Relative: 0 %
HCT: 47.1 % — ABNORMAL HIGH (ref 36.0–46.0)
Hemoglobin: 15.2 g/dL — ABNORMAL HIGH (ref 12.0–15.0)
Immature Granulocytes: 1 %
Lymphocytes Relative: 7 %
Lymphs Abs: 1.2 10*3/uL (ref 0.7–4.0)
MCH: 31.1 pg (ref 26.0–34.0)
MCHC: 32.3 g/dL (ref 30.0–36.0)
MCV: 96.3 fL (ref 80.0–100.0)
Monocytes Absolute: 1.2 10*3/uL — ABNORMAL HIGH (ref 0.1–1.0)
Monocytes Relative: 7 %
Neutro Abs: 15.6 10*3/uL — ABNORMAL HIGH (ref 1.7–7.7)
Neutrophils Relative %: 85 %
Platelets: 376 10*3/uL (ref 150–400)
RBC: 4.89 MIL/uL (ref 3.87–5.11)
RDW: 12.7 % (ref 11.5–15.5)
WBC: 18.3 10*3/uL — ABNORMAL HIGH (ref 4.0–10.5)
nRBC: 0 % (ref 0.0–0.2)

## 2020-10-07 LAB — BRAIN NATRIURETIC PEPTIDE: B Natriuretic Peptide: 88.9 pg/mL (ref 0.0–100.0)

## 2020-10-07 LAB — LIPASE, BLOOD: Lipase: 33 U/L (ref 11–51)

## 2020-10-07 LAB — TROPONIN I (HIGH SENSITIVITY): Troponin I (High Sensitivity): 11 ng/L (ref <18)

## 2020-10-07 MED ORDER — ONDANSETRON HCL 4 MG/2ML IJ SOLN: 4.0000 mg | Freq: Four times a day (QID) | INTRAMUSCULAR | Status: DC | PRN

## 2020-10-07 MED ORDER — ATORVASTATIN CALCIUM 20 MG PO TABS
40.0000 mg | ORAL_TABLET | Freq: Every day | ORAL | Status: DC
  Administered 2020-10-09 – 2020-10-10 (×2): 40 mg via ORAL
  Filled 2020-10-07 (×2): qty 2

## 2020-10-07 MED ORDER — ACETAMINOPHEN 325 MG PO TABS: 650.0000 mg | ORAL_TABLET | Freq: Four times a day (QID) | ORAL | Status: DC | PRN

## 2020-10-07 MED ORDER — TRAZODONE HCL 50 MG PO TABS
25.0000 mg | ORAL_TABLET | Freq: Every evening | ORAL | Status: DC | PRN
  Administered 2020-10-09 – 2020-10-10 (×2): 25 mg via ORAL
  Filled 2020-10-07 (×2): qty 1

## 2020-10-07 MED ORDER — SODIUM CHLORIDE 0.9 % IV BOLUS
1000.0000 mL | Freq: Once | INTRAVENOUS | Status: AC
  Administered 2020-10-07: 1000 mL via INTRAVENOUS

## 2020-10-07 MED ORDER — POTASSIUM CHLORIDE 10 MEQ/100ML IV SOLN
10.0000 meq | INTRAVENOUS | Status: DC
  Administered 2020-10-08: 10 meq via INTRAVENOUS
  Filled 2020-10-07: qty 100

## 2020-10-07 MED ORDER — GABAPENTIN 100 MG PO CAPS
100.0000 mg | ORAL_CAPSULE | Freq: Two times a day (BID) | ORAL | Status: DC
  Administered 2020-10-08 – 2020-10-11 (×7): 100 mg via ORAL
  Filled 2020-10-07 (×7): qty 1

## 2020-10-07 MED ORDER — INSULIN REGULAR(HUMAN) IN NACL 100-0.9 UT/100ML-% IV SOLN: INTRAVENOUS | Status: DC

## 2020-10-07 MED ORDER — DILTIAZEM HCL ER COATED BEADS 120 MG PO CP24
120.0000 mg | ORAL_CAPSULE | Freq: Every day | ORAL | Status: DC
Start: 2020-10-08 — End: 2020-10-09
  Filled 2020-10-07 (×2): qty 1

## 2020-10-07 MED ORDER — DEXTROSE 50 % IV SOLN: 0.0000 mL | INTRAVENOUS | Status: DC | PRN

## 2020-10-07 MED ORDER — DEXTROSE IN LACTATED RINGERS 5 % IV SOLN: INTRAVENOUS | Status: DC

## 2020-10-07 MED ORDER — THIAMINE HCL 100 MG PO TABS
100.0000 mg | ORAL_TABLET | Freq: Every day | ORAL | Status: DC
  Administered 2020-10-08 – 2020-10-11 (×4): 100 mg via ORAL
  Filled 2020-10-07 (×4): qty 1

## 2020-10-07 MED ORDER — LACTATED RINGERS IV SOLN: INTRAVENOUS | Status: DC

## 2020-10-07 MED ORDER — ADENOSINE 12 MG/4ML IV SOLN
12.0000 mg | Freq: Once | INTRAVENOUS | Status: AC
  Administered 2020-10-07: 12 mg via INTRAVENOUS

## 2020-10-07 MED ORDER — LACTATED RINGERS IV BOLUS: 20.0000 mL/kg | Freq: Once | INTRAVENOUS | Status: DC

## 2020-10-07 MED ORDER — KETOROLAC TROMETHAMINE 30 MG/ML IJ SOLN
30.0000 mg | Freq: Once | INTRAMUSCULAR | Status: DC
  Filled 2020-10-07: qty 1

## 2020-10-07 MED ORDER — ACETAMINOPHEN 650 MG RE SUPP: 650.0000 mg | Freq: Four times a day (QID) | RECTAL | Status: DC | PRN

## 2020-10-07 MED ORDER — INSULIN GLARGINE 100 UNIT/ML ~~LOC~~ SOLN
50.0000 [IU] | Freq: Every day | SUBCUTANEOUS | Status: DC
  Filled 2020-10-07 (×2): qty 0.5

## 2020-10-07 MED ORDER — SODIUM CHLORIDE 0.9 % IV SOLN: INTRAVENOUS | Status: DC

## 2020-10-07 MED ORDER — BUPROPION HCL ER (SR) 150 MG PO TB12
150.0000 mg | ORAL_TABLET | Freq: Two times a day (BID) | ORAL | Status: DC
  Administered 2020-10-09 – 2020-10-11 (×5): 150 mg via ORAL
  Filled 2020-10-07 (×10): qty 1

## 2020-10-07 MED ORDER — LORATADINE 10 MG PO TABS
10.0000 mg | ORAL_TABLET | Freq: Every day | ORAL | Status: DC
  Administered 2020-10-08 – 2020-10-11 (×4): 10 mg via ORAL
  Filled 2020-10-07 (×4): qty 1

## 2020-10-07 MED ORDER — FOLIC ACID 1 MG PO TABS
1.0000 mg | ORAL_TABLET | Freq: Every day | ORAL | Status: DC
  Administered 2020-10-08 – 2020-10-11 (×4): 1 mg via ORAL
  Filled 2020-10-07 (×4): qty 1

## 2020-10-07 MED ORDER — KETOROLAC TROMETHAMINE 60 MG/2ML IM SOLN
30.0000 mg | Freq: Once | INTRAMUSCULAR | Status: AC
  Administered 2020-10-07: 30 mg via INTRAMUSCULAR

## 2020-10-07 MED ORDER — ALBUTEROL SULFATE (2.5 MG/3ML) 0.083% IN NEBU: 2.5000 mg | INHALATION_SOLUTION | Freq: Four times a day (QID) | RESPIRATORY_TRACT | Status: DC | PRN

## 2020-10-07 MED ORDER — MAGNESIUM HYDROXIDE 400 MG/5ML PO SUSP
30.0000 mL | Freq: Every day | ORAL | Status: DC | PRN
  Filled 2020-10-07: qty 30

## 2020-10-07 MED ORDER — ADULT MULTIVITAMIN W/MINERALS CH
1.0000 | ORAL_TABLET | Freq: Every day | ORAL | Status: DC
  Administered 2020-10-08 – 2020-10-11 (×4): 1 via ORAL
  Filled 2020-10-07 (×4): qty 1

## 2020-10-07 MED ORDER — INSULIN REGULAR(HUMAN) IN NACL 100-0.9 UT/100ML-% IV SOLN
INTRAVENOUS | Status: DC
  Administered 2020-10-08: 13 [IU]/h via INTRAVENOUS
  Administered 2020-10-08: 16 [IU]/h via INTRAVENOUS
  Filled 2020-10-07 (×2): qty 100

## 2020-10-07 MED ORDER — ONDANSETRON HCL 4 MG PO TABS: 4.0000 mg | ORAL_TABLET | Freq: Four times a day (QID) | ORAL | Status: DC | PRN

## 2020-10-07 MED ORDER — ENOXAPARIN SODIUM 40 MG/0.4ML ~~LOC~~ SOLN
40.0000 mg | SUBCUTANEOUS | Status: DC
  Administered 2020-10-08 – 2020-10-10 (×3): 40 mg via SUBCUTANEOUS
  Filled 2020-10-07 (×3): qty 0.4

## 2020-10-07 NOTE — ED Notes (Signed)
Late entry -- Dr Jackelyn Poling notified of critical pH 6.92 - provider also made aware of IV infiltration -- IV team consult order placed; pt will need dedicated line for Insulin drip and 2nd line for blood draws and additional IV meds

## 2020-10-07 NOTE — H&P (Incomplete)
[place]   PATIENT NAME: Pamela Lane    MR#:  [id number]  DATE OF BIRTH:  August 31, 1960  DATE OF ADMISSION:  10/07/2020  PRIMARY CARE PHYSICIAN: [facility]   Patient is coming from: Home  REQUESTING/REFERRING PHYSICIAN: Vicente Males, Clent Jacks, MD  CHIEF COMPLAINT:   Chief Complaint  Patient presents with  . Chest Pain    HISTORY OF PRESENT ILLNESS:  Pamela Lane is a 60 y.o. African-American female with medical history significant for type diabetes mellitus, hypertension, asthma and C. difficile colitis, who presented to the emergency room with acute onset of chest pain with palpitations, headache and recurrent vomiting over the last 3 days. ED Course: Upon presentation to the ER, heart rate was 192 and pulse oximetry was 100% on 100% nonrebreather and blood pressure initially normal was up to 179/102.  Labs revealed venous blood gas with pH of 6.92 and HCO3 6.4 and BMP revealed showed hyponatremia 129 potassium was 5.1 with a chloride of 99.  Glucose was 642 and BUN was 19 with creatinine of 1.77 and LFTs showed total bili of 1.5.  CBC showed leukocytosis of 18.3 with neutrophilia and hemoconcentration.  BNP was 88.9 and high-sensitivity troponin I was 11.  EKG as reviewed by me : Showed supraventricular cardia with a rate of 195 with right axis deviation with T wave inversion laterally.  After IV adenosine 12 mg IV twice a repeat EKG showed sinus tachycardia with a rate of 124.  Imaging:Portable chest x-ray showed no acute cardiopulmonary disease.  The patient was given 30 mg IM Toradol and 1 L bolus of IV normal saline and about 1.6 L of IV lactated Ringer followed by 125 mL/h in addition to IV adenosine mentioned above.  She will be admitted to a stepdown unit bed for further evaluation and management. PAST MEDICAL HISTORY:   Past Medical History:  Diagnosis Date  . Asthma   . Clostridium difficile colitis   . Diabetes mellitus without  complication (HCC)   . Hypertension   . Mental disorder    pt reports 'I have all of them mental disorders'    PAST SURGICAL HISTORY:   Past Surgical History:  Procedure Laterality Date  . ABDOMINAL HYSTERECTOMY      SOCIAL HISTORY:   Social History   Tobacco Use  . Smoking status: Current Every Day Smoker    Packs/day: 0.25    Types: Cigarettes  . Smokeless tobacco: Never Used  Substance Use Topics  . Alcohol use: Yes    Alcohol/week: 40.0 standard drinks    Types: 40 Cans of beer per week    Comment: 3 big bottles of liquor    FAMILY HISTORY:   Family History  Problem Relation Age of Onset  . Hypertension Mother     DRUG ALLERGIES:  No Known Allergies  REVIEW OF SYSTEMS:   ROS As per history of present illness. All pertinent systems were reviewed above. Constitutional, HEENT, cardiovascular, respiratory, GI, GU, musculoskeletal, neuro, psychiatric, endocrine, integumentary and hematologic systems were reviewed and are otherwise negative/unremarkable except for positive findings mentioned above in the HPI.   MEDICATIONS AT HOME:   Prior to Admission medications   Medication Sig Start Date End Date Taking? Authorizing Provider  albuterol (PROVENTIL HFA;VENTOLIN HFA) 108 (90 Base) MCG/ACT inhaler Inhale 2 puffs into the lungs every 6 (six) hours as needed for wheezing or shortness of breath. 02/18/18   Auburn Bilberry, MD  aspirin EC 81 MG  tablet Take 81 mg by mouth daily.    [provider]  atorvastatin (LIPITOR) 40 MG tablet Take 1 tablet (40 mg total) by mouth at bedtime. 08/29/20 11/27/20  Darlin Drop, DO  buPROPion (ZYBAN) 150 MG 12 hr tablet Take 150 mg by mouth 2 (two) times daily. 05/21/20   [provider]  cetirizine (ZYRTEC) 10 MG tablet Take 10 mg by mouth daily. 02/09/20   [provider]  diltiazem (CARDIZEM CD) 120 MG 24 hr capsule Take 1 capsule (120 mg total) by mouth daily. 08/29/20 09/28/20  Darlin Drop, DO  folic  acid (FOLVITE) 1 MG tablet Take 1 tablet (1 mg total) by mouth daily. 03/08/18   Ramonita Lab, MD  gabapentin (NEURONTIN) 100 MG capsule Take 1 capsule (100 mg total) by mouth 2 (two) times daily. 08/29/20 11/27/20  Darlin Drop, DO  insulin glargine (LANTUS) 100 UNIT/ML Solostar Pen Inject 50 Units into the skin daily. 08/29/20 11/27/20  Darlin Drop, DO  metFORMIN (GLUCOPHAGE-XR) 500 MG 24 hr tablet Take 2 tablets (1,000 mg total) by mouth daily with breakfast. 08/29/20 11/27/20  Darlin Drop, DO  Multiple Vitamin (MULTIVITAMIN WITH MINERALS) TABS tablet Take 1 tablet by mouth daily. 03/08/18   Gouru, Deanna Artis, MD  NOVOLOG FLEXPEN 100 UNIT/ML FlexPen Inject 10 Units into the skin 3 (three) times daily with meals. 08/29/20 11/27/20  Darlin Drop, DO  thiamine 100 MG tablet Take 1 tablet (100 mg total) by mouth daily. 03/08/18   Gouru, Deanna Artis, MD      VITAL SIGNS:  Blood pressure (!) 179/102, pulse (!) 126, resp. rate (!) 27, SpO2 100 %.  PHYSICAL EXAMINATION:  Physical Exam  GENERAL:  60 y.o.-year-old African-American female patient lying in the bed with no acute distress.  EYES: Pupils equal, round, reactive to light and accommodation. No scleral icterus. Extraocular muscles intact.  HEENT: Head atraumatic, normocephalic. Oropharynx and nasopharynx clear.  NECK:  Supple, no jugular venous distention. No thyroid enlargement, no tenderness.  LUNGS: Normal breath sounds bilaterally, no wheezing, rales,rhonchi or crepitation. No use of accessory muscles of respiration.  CARDIOVASCULAR: Regular rate and rhythm, S1, S2 normal. No murmurs, rubs, or gallops.  ABDOMEN: Soft, nondistended, nontender. Bowel sounds present. No organomegaly or mass.  EXTREMITIES: No pedal edema, cyanosis, or clubbing.  NEUROLOGIC: Cranial nerves II through XII are intact. Muscle strength 5/5 in all extremities. Sensation intact. Gait not checked.  PSYCHIATRIC: The patient is alert and oriented x 3.  Normal affect and good eye  contact. SKIN: No obvious rash, lesion, or ulcer.   LABORATORY PANEL:   CBC Recent Labs  Lab 10/07/20 2219  WBC 18.3*  HGB 15.2*  HCT 47.1*  PLT 376   ------------------------------------------------------------------------------------------------------------------  Chemistries  Recent Labs  Lab 10/07/20 2219  NA 129*  K 5.1  CL 99  CO2 <7*  GLUCOSE 642*  BUN 19  CREATININE 1.77*  CALCIUM 9.0  AST 14*  ALT 13  ALKPHOS 98  BILITOT 1.5*   ------------------------------------------------------------------------------------------------------------------  Cardiac Enzymes No results for input(s): TROPONINI in the last 168 hours. ------------------------------------------------------------------------------------------------------------------  RADIOLOGY:  No results found.    IMPRESSION AND PLAN:  Active Problems:   DKA (diabetic ketoacidosis) (HCC)  1.  Diabetic ketoacidosis with uncontrolled type diabetes mellitus, likely secondary to noncompliance.  The last hemoglobin A1c was 13.7 on 08/27/2020. -The patient will be admitted to a stepdown unit bed. -She will be aggressively hydrated with IV normal saline. -We will continue her on  IV insulin drip per Endo tool. -We will follow serial BMPs.  2.  Paroxysmal supraventricular tachycardia, likely secondary to #1. -The patient responded to IV adenosine but is still currently in sinus tachycardia. -We will continue hydration with IV normal saline and monitor her rhythm. -We will check magnesium level and monitor her potassium.  3.  DVT prophylaxis: Lovenox***  Code Status: full code***  Family Communication:  The plan of care was discussed in details with the patient (and family). I answered all questions. The patient agreed to proceed with the above mentioned plan. Further management will depend upon hospital course. Disposition Plan: Back to previous home environment Consults called: none***  All the records  are reviewed and case discussed with ED provider.  Status is: Inpatient  {Inpatient:23812}  Dispo: The patient is from: {From:23814}              Anticipated d/c is to: {To:23815}              Patient currently {Medically stable:23817}   Difficult to place patient {Yes/No:25151}      TOTAL TIME TAKING CARE OF THIS PATIENT: *** minutes.    Hannah Beat M.D on 10/07/2020 at 11:49 PM  [facility]   From 7 PM-7 AM, contact night-coverage [email or web address]  CC: Primary care physician; [facility]

## 2020-10-07 NOTE — H&P (Addendum)
[place]   PATIENT NAME: Pamela Lane    MR#:  [id number]  DATE OF BIRTH:  [birth year]  DATE OF ADMISSION:  10/07/2020  PRIMARY CARE PHYSICIAN: [facility]   Patient is coming from: Home  REQUESTING/REFERRING PHYSICIAN: Vicente Males, Clent Jacks, MD  CHIEF COMPLAINT:   Chief Complaint  Patient presents with  . Chest Pain    HISTORY OF PRESENT ILLNESS:  Pamela Lane is a 60 y.o. African-American female with medical history significant for type diabetes mellitus, hypertension, asthma and C. difficile colitis, who presented to the emergency room with acute onset of chest pain with palpitations, headache and recurrent nausea and vomiting over the last 5 to 6 days.  She has been having polyuria and polydipsia.  She denies any fever or chills.  No bilious vomitus or hematemesis.  No abdominal pain or melena or bright red bleeding per rectum.  She admitted to associated feeling of fluttering with her chest pain with no radiation or diaphoresis.  No leg pain or edema recent travels or surgeries.  No cough or wheezing or hemoptysis. ED Course: Upon presentation to the ER, heart rate was 192 and pulse oximetry was 100% on 100% nonrebreather and blood pressure initially normal was up to 179/102.  Labs revealed venous blood gas with pH of 6.92 and HCO3 6.4 and BMP revealed showed hyponatremia 129 potassium was 5.1 with a chloride of 99.  Glucose was 642 and BUN was 19 with creatinine of 1.77 and LFTs showed total bili of 1.5.  CBC showed leukocytosis of 18.3 with neutrophilia and hemoconcentration.  BNP was 88.9 and high-sensitivity troponin I was 11.  EKG as reviewed by me : Showed supraventricular cardia with a rate of 195 with right axis deviation with T wave inversion laterally.  After IV adenosine 12 mg IV twice a repeat EKG showed sinus tachycardia with a rate of 124.  Imaging:Portable chest x-ray showed no acute cardiopulmonary disease.  The patient was  given 30 mg IM Toradol and 1 L bolus of IV normal saline and about 1.6 L of IV lactated Ringer followed by 125 mL/h in addition to IV adenosine mentioned above.  She will be admitted to a stepdown unit bed for further evaluation and management. PAST MEDICAL HISTORY:   Past Medical History:  Diagnosis Date  . Asthma   . Clostridium difficile colitis   . Diabetes mellitus without complication (HCC)   . Hypertension   . Mental disorder    pt reports 'I have all of them mental disorders'    PAST SURGICAL HISTORY:   Past Surgical History:  Procedure Laterality Date  . ABDOMINAL HYSTERECTOMY      SOCIAL HISTORY:   Social History   Tobacco Use  . Smoking status: Current Every Day Smoker    Packs/day: 0.25    Types: Cigarettes  . Smokeless tobacco: Never Used  Substance Use Topics  . Alcohol use: Yes    Alcohol/week: 40.0 standard drinks    Types: 40 Cans of beer per week    Comment: 3 big bottles of liquor    FAMILY HISTORY:   Family History  Problem Relation Age of Onset  . Hypertension Mother     DRUG ALLERGIES:  No Known Allergies  REVIEW OF SYSTEMS:   ROS As per history of present illness. All pertinent systems were reviewed above. Constitutional, HEENT, cardiovascular, respiratory, GI, GU, musculoskeletal, neuro, psychiatric, endocrine, integumentary and hematologic systems were reviewed and are otherwise  negative/unremarkable except for positive findings mentioned above in the HPI.   MEDICATIONS AT HOME:   Prior to Admission medications   Medication Sig Start Date End Date Taking? Authorizing Provider  albuterol (PROVENTIL HFA;VENTOLIN HFA) 108 (90 Base) MCG/ACT inhaler Inhale 2 puffs into the lungs every 6 (six) hours as needed for wheezing or shortness of breath. 02/18/18   Auburn Bilberry, MD  aspirin EC 81 MG tablet Take 81 mg by mouth daily.    [provider]  atorvastatin (LIPITOR) 40 MG tablet Take 1 tablet (40 mg total) by mouth at bedtime.  08/29/20 11/27/20  Darlin Drop, DO  buPROPion (ZYBAN) 150 MG 12 hr tablet Take 150 mg by mouth 2 (two) times daily. 05/21/20   [provider]  cetirizine (ZYRTEC) 10 MG tablet Take 10 mg by mouth daily. 02/09/20   [provider]  diltiazem (CARDIZEM CD) 120 MG 24 hr capsule Take 1 capsule (120 mg total) by mouth daily. 08/29/20 09/28/20  Darlin Drop, DO  folic acid (FOLVITE) 1 MG tablet Take 1 tablet (1 mg total) by mouth daily. 03/08/18   Ramonita Lab, MD  gabapentin (NEURONTIN) 100 MG capsule Take 1 capsule (100 mg total) by mouth 2 (two) times daily. 08/29/20 11/27/20  Darlin Drop, DO  insulin glargine (LANTUS) 100 UNIT/ML Solostar Pen Inject 50 Units into the skin daily. 08/29/20 11/27/20  Darlin Drop, DO  metFORMIN (GLUCOPHAGE-XR) 500 MG 24 hr tablet Take 2 tablets (1,000 mg total) by mouth daily with breakfast. 08/29/20 11/27/20  Darlin Drop, DO  Multiple Vitamin (MULTIVITAMIN WITH MINERALS) TABS tablet Take 1 tablet by mouth daily. 03/08/18   Gouru, Deanna Artis, MD  NOVOLOG FLEXPEN 100 UNIT/ML FlexPen Inject 10 Units into the skin 3 (three) times daily with meals. 08/29/20 11/27/20  Darlin Drop, DO  thiamine 100 MG tablet Take 1 tablet (100 mg total) by mouth daily. 03/08/18   Gouru, Deanna Artis, MD      VITAL SIGNS:  Blood pressure (!) 179/102, pulse (!) 126, resp. rate (!) 27, SpO2 100 %.  PHYSICAL EXAMINATION:  Physical Exam  GENERAL:  60 y.o.-year-old African-American female patient lying in the bed with no acute distress.  EYES: Pupils equal, round, reactive to light and accommodation. No scleral icterus. Extraocular muscles intact.  HEENT: Head atraumatic, normocephalic. Oropharynx and nasopharynx clear.  NECK:  Supple, no jugular venous distention. No thyroid enlargement, no tenderness.  LUNGS: Normal breath sounds bilaterally, no wheezing, rales,rhonchi or crepitation. No use of accessory muscles of respiration.  CARDIOVASCULAR: Regular rate and rhythm, S1, S2  normal. No murmurs, rubs, or gallops.  ABDOMEN: Soft, nondistended, nontender. Bowel sounds present. No organomegaly or mass.  EXTREMITIES: No pedal edema, cyanosis, or clubbing.  NEUROLOGIC: Cranial nerves II through XII are intact. Muscle strength 5/5 in all extremities. Sensation intact. Gait not checked.  PSYCHIATRIC: The patient is alert and oriented x 3.  Normal affect and good eye contact. SKIN: No obvious rash, lesion, or ulcer.   LABORATORY PANEL:   CBC Recent Labs  Lab 10/07/20 2219  WBC 18.3*  HGB 15.2*  HCT 47.1*  PLT 376   ------------------------------------------------------------------------------------------------------------------  Chemistries  Recent Labs  Lab 10/07/20 2219  NA 129*  K 5.1  CL 99  CO2 <7*  GLUCOSE 642*  BUN 19  CREATININE 1.77*  CALCIUM 9.0  AST 14*  ALT 13  ALKPHOS 98  BILITOT 1.5*   ------------------------------------------------------------------------------------------------------------------  Cardiac Enzymes No results for input(s): TROPONINI in the last  168 hours. ------------------------------------------------------------------------------------------------------------------  RADIOLOGY:  No results found.    IMPRESSION AND PLAN:  Active Problems:   DKA (diabetic ketoacidosis) (HCC)  1.  Diabetic ketoacidosis with uncontrolled type 2 diabetes mellitus, likely secondary to noncompliance.  The last hemoglobin A1c was 13.7 on 08/27/2020. -The patient will be admitted to a stepdown unit bed. -She will be aggressively hydrated with IV normal saline. -We will continue her on IV insulin drip per Endo tool. -We will follow serial BMPs. -We will hold off Lantus and Metformin.  2.  Paroxysmal supraventricular tachycardia, likely secondary to #1 with associated chest pain. -The patient responded to IV adenosine but is still currently in sinus tachycardia. -We will continue hydration with IV normal saline and monitor her  rhythm. -We will check magnesium level and monitor her potassium. -Cardizem CD will be continued. -We will follow serial troponin I's. -Cardiology consult will be obtained. -I notified Dr. Gwen Pounds about the patient.  3.  Hypertensive urgency. -We will hold off her lisinopril given acute kidney injury. -She will be placed on as needed IV labetalol.  4.  Acute kidney injury, likely prerenal secondary to recurrent vomiting and dehydration.  She has associated hyponatremia likely pseudohyponatremia from hyperglycemia.. -The patient will be hydrated with IV normal saline and will follow BMPs.  5.  Depression. -We will continue Wellbutrin SR.  5.  Dyslipidemia. -We will continue statin therapy.  6.  Peripheral neuropathy. -We will continue Neurontin.  7.  Asthma, currently without exacerbation. -She will be placed on as needed albuterol MDI will monitor her tachycardia.  DVT prophylaxis: Lovenox. Code Status: full code. Family Communication:  The plan of care was discussed in details with the patient who did not request any other family members to be notified at this time. I answered all questions. The patient agreed to proceed with the above mentioned plan. Further management will depend upon hospital course. Disposition Plan: Back to previous home environment Consults called: none.   All the records are reviewed and case discussed with ED provider.  Status is: Inpatient  Remains inpatient appropriate because:Ongoing diagnostic testing needed not appropriate for outpatient work up, Unsafe d/c plan, IV treatments appropriate due to intensity of illness or inability to take PO and Inpatient level of care appropriate due to severity of illness   Dispo: The patient is from: Home              Anticipated d/c is to: Home              Patient currently is not medically stable to d/c.   Difficult to place patient No  TOTAL TIME TAKING CARE OF THIS PATIENT: 55 minutes.    Hannah Beat M.D on 10/07/2020 at 11:49 PM  [facility]   From 7 PM-7 AM, contact night-coverage [email or web address]  CC: Primary care physician; [facility]

## 2020-10-07 NOTE — ED Notes (Signed)
PCXR completed at bedside-- IV team nurse now at bedside

## 2020-10-07 NOTE — ED Notes (Signed)
2nd dose of Adenosine 12mg  IVP at this time- administered by , RN

## 2020-10-07 NOTE — ED Triage Notes (Signed)
Pt arrives from home via [facility] with c/o generalized cp x3 days; upon arrival pt also c/o HA - states pain 10/10.  Pt with h/o medication noncompliance -- current CBG 537.  H/O DM.  Pt verbally abusive to staff upon arrival - cursing and raising her voice.

## 2020-10-07 NOTE — ED Notes (Signed)
After 2nd dose of Adenosine pt now sinus tach HR 120s-  Verbal order for fluid boluses called out by Dr Vicente Males

## 2020-10-07 NOTE — ED Notes (Signed)
12mg  IVP Adenosine to 18G L FA - pushed by , RN

## 2020-10-07 NOTE — ED Provider Notes (Addendum)
[facility] Emergency Department Provider Note   ____________________________________________   Event Date/Time   First MD Initiated Contact with Patient 10/07/20 2336     (approximate)  I have reviewed the triage vital signs and the nursing notes.   HISTORY  Chief Complaint Chest Pain    HPI Pamela Lane is a 60 y.o. female with a past medical history of of poorly controlled type 2 diabetes, hypertension, and bipolar disorder who presents via EMS complaining of generalized chest pain, headache, vomiting for the last 3 days.  Patient will not elaborate on any of these pains and only moans.  When patient does wake she is verbally abusive towards staff.  Further history and review of systems are unable to be assessed at this time         Past Medical History:  Diagnosis Date  . Asthma   . Clostridium difficile colitis   . Diabetes mellitus without complication (HCC)   . Hypertension   . Mental disorder    pt reports 'I have all of them mental disorders'    Patient Active Problem List   Diagnosis Date Noted  . DKA (diabetic ketoacidosis) (HCC) 08/27/2020  . Tobacco abuse 10/04/2019  . DKA, type 2 (HCC) 10/02/2019  . Essential hypertension 10/02/2019  . Bipolar disorder (HCC) 01/17/2019  . Acute pancreatitis 05/15/2016  . AKI (acute kidney injury) (HCC)   . Diabetic ketoacidosis without coma associated with type 2 diabetes mellitus (HCC)   . Hyperglycemia due to type 2 diabetes mellitus (HCC) 01/13/2016  . UTI (urinary tract infection) 01/13/2016  . DKA (diabetic ketoacidoses) 01/08/2016  . Hyponatremia 01/08/2016  . Dehydration 01/08/2016  . Urinary tract infection 01/08/2016  . Upper abdominal pain 01/08/2016  . Nausea & vomiting 01/08/2016    Past Surgical History:  Procedure Laterality Date  . ABDOMINAL HYSTERECTOMY      Prior to Admission medications   Medication Sig Start Date End Date Taking? Authorizing Provider   albuterol (PROVENTIL HFA;VENTOLIN HFA) 108 (90 Base) MCG/ACT inhaler Inhale 2 puffs into the lungs every 6 (six) hours as needed for wheezing or shortness of breath. 02/18/18  Yes Auburn BilberryPatel, Shreyang, MD  aspirin EC 81 MG tablet Take 81 mg by mouth daily.   Yes [provider]  atorvastatin (LIPITOR) 40 MG tablet Take 1 tablet (40 mg total) by mouth at bedtime. 08/29/20 11/27/20 Yes Hall, Carole N, DO  buPROPion (WELLBUTRIN SR) 150 MG 12 hr tablet Take 150 mg by mouth 2 (two) times daily. 10/01/20  Yes [provider]  diltiazem (CARDIZEM CD) 120 MG 24 hr capsule Take 1 capsule (120 mg total) by mouth daily. 08/29/20 09/28/20 Yes Darlin DropHall, Carole N, DO  folic acid (FOLVITE) 1 MG tablet Take 1 tablet (1 mg total) by mouth daily. 03/08/18  Yes Gouru, Deanna ArtisAruna, MD  gabapentin (NEURONTIN) 100 MG capsule Take 1 capsule (100 mg total) by mouth 2 (two) times daily. 08/29/20 11/27/20 Yes Hall, Carole N, DO  insulin glargine (LANTUS) 100 UNIT/ML Solostar Pen Inject 50 Units into the skin daily. 08/29/20 11/27/20 Yes Hall, Carole N, DO  lisinopril (ZESTRIL) 10 MG tablet Take 10 mg by mouth daily. 10/01/20  Yes [provider]  metFORMIN (GLUCOPHAGE-XR) 500 MG 24 hr tablet Take 2 tablets (1,000 mg total) by mouth daily with breakfast. 08/29/20 11/27/20 Yes Darlin DropHall, Carole N, DO  Multiple Vitamin (MULTIVITAMIN WITH MINERALS) TABS tablet Take 1 tablet by mouth daily. 03/08/18  Yes Gouru, Deanna ArtisAruna, MD  Darylene PriceNOVOLOG FLEXPEN  100 UNIT/ML FlexPen Inject 10 Units into the skin 3 (three) times daily with meals. 08/29/20 11/27/20 Yes Darlin Drop, DO  vitamin B-12 (CYANOCOBALAMIN) 500 MCG tablet Take 500 mcg by mouth daily.   Yes [provider]  buPROPion (ZYBAN) 150 MG 12 hr tablet Take 150 mg by mouth 2 (two) times daily. Patient not taking: Reported on 10/08/2020 05/21/20   [provider]  cetirizine (ZYRTEC) 10 MG tablet Take 10 mg by mouth daily. Patient not taking: Reported on 10/08/2020 02/09/20    [provider]  thiamine 100 MG tablet Take 1 tablet (100 mg total) by mouth daily. Patient not taking: Reported on 10/08/2020 03/08/18   Ramonita Lab, MD    Allergies Patient has no known allergies.  Family History  Problem Relation Age of Onset  . Hypertension Mother     Social History Social History   Tobacco Use  . Smoking status: Current Every Day Smoker    Packs/day: 0.25    Types: Cigarettes  . Smokeless tobacco: Never Used  Vaping Use  . Vaping Use: Never used  Substance Use Topics  . Alcohol use: Yes    Alcohol/week: 40.0 standard drinks    Types: 40 Cans of beer per week    Comment: 3 big bottles of liquor  . Drug use: Yes    Types: Marijuana, Cocaine    Review of Systems Unable to assess ____________________________________________   PHYSICAL EXAM:  VITAL SIGNS: ED Triage Vitals  Enc Vitals Group     BP 10/07/20 2210 (!) 125/94     Pulse Rate 10/07/20 2210 (!) 192     Resp 10/07/20 2300 (!) 27     Temp --      Temp src --      SpO2 10/07/20 2210 100 %     Weight --      Height --      Head Circumference --      Peak Flow --      Pain Score 10/07/20 2206 10     Pain Loc --      Pain Edu? --      Excl. in GC? --    Constitutional: Alert and oriented.  Elderly obese African-American female in no acute distress. Eyes: Conjunctivae are injected. PERRL. Head: Atraumatic. Nose: No congestion/rhinnorhea. Mouth/Throat: Mucous membranes are moist. Neck: No stridor Cardiovascular: Grossly normal heart sounds.  Good peripheral circulation. Respiratory: Increased respiratory effort with tachypnea.  No retractions. Gastrointestinal: Soft and nontender. No distention. Musculoskeletal: No obvious deformities Neurologic:  Normal speech and language. No gross focal neurologic deficits are appreciated. Skin:  Skin is warm and dry. No rash noted. Psychiatric: Mood and affect are agitated/irritable.   ____________________________________________    LABS (all labs ordered are listed, but only abnormal results are displayed)  Labs Reviewed  COMPREHENSIVE METABOLIC PANEL - Abnormal; Notable for the following components:      Result Value   Sodium 129 (*)    CO2 <7 (*)    Glucose, Bld 642 (*)    Creatinine, Ser 1.77 (*)    AST 14 (*)    Total Bilirubin 1.5 (*)    GFR, Estimated 33 (*)    All other components within normal limits  BLOOD GAS, VENOUS - Abnormal; Notable for the following components:   pH, Ven 6.92 (*)    pCO2, Ven 31 (*)    pO2, Ven <31.0 (*)    Bicarbonate 6.4 (*)    Acid-base deficit 25.7 (*)  All other components within normal limits  URINALYSIS, COMPLETE (UACMP) WITH MICROSCOPIC - Abnormal; Notable for the following components:   Color, Urine STRAW (*)    APPearance HAZY (*)    Glucose, UA >=500 (*)    Hgb urine dipstick MODERATE (*)    Ketones, ur 80 (*)    Protein, ur 100 (*)    Bacteria, UA RARE (*)    All other components within normal limits  CBC WITH DIFFERENTIAL/PLATELET - Abnormal; Notable for the following components:   WBC 18.3 (*)    Hemoglobin 15.2 (*)    HCT 47.1 (*)    Neutro Abs 15.6 (*)    Monocytes Absolute 1.2 (*)    Abs Immature Granulocytes 0.18 (*)    All other components within normal limits  BASIC METABOLIC PANEL - Abnormal; Notable for the following components:   Potassium 3.4 (*)    CO2 21 (*)    Glucose, Bld 204 (*)    Creatinine, Ser 1.12 (*)    Calcium 8.2 (*)    GFR, Estimated 57 (*)    All other components within normal limits  BETA-HYDROXYBUTYRIC ACID - Abnormal; Notable for the following components:   Beta-Hydroxybutyric Acid 2.88 (*)    All other components within normal limits  BETA-HYDROXYBUTYRIC ACID - Abnormal; Notable for the following components:   Beta-Hydroxybutyric Acid 0.47 (*)    All other components within normal limits  HEMOGLOBIN A1C - Abnormal; Notable for the following components:   Hgb A1c MFr Bld 13.6 (*)    All other components within  normal limits  CBC - Abnormal; Notable for the following components:   WBC 22.2 (*)    Hemoglobin 15.7 (*)    HCT 48.2 (*)    Platelets 413 (*)    All other components within normal limits  BETA-HYDROXYBUTYRIC ACID - Abnormal; Notable for the following components:   Beta-Hydroxybutyric Acid 6.16 (*)    All other components within normal limits  BASIC METABOLIC PANEL - Abnormal; Notable for the following components:   CO2 12 (*)    Glucose, Bld 376 (*)    BUN 23 (*)    Creatinine, Ser 1.69 (*)    Calcium 8.1 (*)    GFR, Estimated 35 (*)    All other components within normal limits  URINE DRUG SCREEN, QUALITATIVE ([facility] ONLY) - Abnormal; Notable for the following components:   Cocaine Metabolite,Ur Crocker POSITIVE (*)    All other components within normal limits  GLUCOSE, CAPILLARY - Abnormal; Notable for the following components:   Glucose-Capillary 426 (*)    All other components within normal limits  BLOOD GAS, ARTERIAL - Abnormal; Notable for the following components:   pH, Arterial 7.16 (*)    pCO2 arterial 25 (*)    pO2, Arterial 199 (*)    Bicarbonate 8.9 (*)    Acid-base deficit 18.1 (*)    All other components within normal limits  BLOOD GAS, ARTERIAL - Abnormal; Notable for the following components:   pH, Arterial 7.34 (*)    pO2, Arterial 78 (*)    Acid-base deficit 3.9 (*)    All other components within normal limits  CBC - Abnormal; Notable for the following components:   WBC 19.7 (*)    HCT 35.7 (*)    All other components within normal limits  GLUCOSE, CAPILLARY - Abnormal; Notable for the following components:   Glucose-Capillary 344 (*)    All other components within normal limits  GLUCOSE, CAPILLARY - Abnormal;  Notable for the following components:   Glucose-Capillary 311 (*)    All other components within normal limits  GLUCOSE, CAPILLARY - Abnormal; Notable for the following components:   Glucose-Capillary 232 (*)    All other components within normal  limits  GLUCOSE, CAPILLARY - Abnormal; Notable for the following components:   Glucose-Capillary 188 (*)    All other components within normal limits  BASIC METABOLIC PANEL - Abnormal; Notable for the following components:   Potassium 3.4 (*)    Glucose, Bld 206 (*)    Creatinine, Ser 1.17 (*)    Calcium 8.3 (*)    GFR, Estimated 54 (*)    All other components within normal limits  BASIC METABOLIC PANEL - Abnormal; Notable for the following components:   Glucose, Bld 181 (*)    Calcium 8.1 (*)    All other components within normal limits  BASIC METABOLIC PANEL - Abnormal; Notable for the following components:   Potassium 2.9 (*)    Glucose, Bld 185 (*)    Calcium 8.3 (*)    All other components within normal limits  GLUCOSE, CAPILLARY - Abnormal; Notable for the following components:   Glucose-Capillary >600 (*)    All other components within normal limits  GLUCOSE, CAPILLARY - Abnormal; Notable for the following components:   Glucose-Capillary 219 (*)    All other components within normal limits  GLUCOSE, CAPILLARY - Abnormal; Notable for the following components:   Glucose-Capillary 164 (*)    All other components within normal limits  GLUCOSE, CAPILLARY - Abnormal; Notable for the following components:   Glucose-Capillary 113 (*)    All other components within normal limits  GLUCOSE, CAPILLARY - Abnormal; Notable for the following components:   Glucose-Capillary 217 (*)    All other components within normal limits  GLUCOSE, CAPILLARY - Abnormal; Notable for the following components:   Glucose-Capillary 196 (*)    All other components within normal limits  GLUCOSE, CAPILLARY - Abnormal; Notable for the following components:   Glucose-Capillary 215 (*)    All other components within normal limits  GLUCOSE, CAPILLARY - Abnormal; Notable for the following components:   Glucose-Capillary 198 (*)    All other components within normal limits  GLUCOSE, CAPILLARY - Abnormal;  Notable for the following components:   Glucose-Capillary 176 (*)    All other components within normal limits  CBG MONITORING, ED - Abnormal; Notable for the following components:   Glucose-Capillary 540 (*)    All other components within normal limits  CBG MONITORING, ED - Abnormal; Notable for the following components:   Glucose-Capillary 580 (*)    All other components within normal limits  RESP PANEL BY RT-PCR (FLU A&B, COVID) ARPGX2  MRSA PCR SCREENING  BRAIN NATRIURETIC PEPTIDE  LIPASE, BLOOD  MAGNESIUM  PHOSPHORUS  PROCALCITONIN  BASIC METABOLIC PANEL  BASIC METABOLIC PANEL  PROCALCITONIN  TRIGLYCERIDES  RENAL FUNCTION PANEL  MAGNESIUM  PHOSPHORUS  TROPONIN I (HIGH SENSITIVITY)  TROPONIN I (HIGH SENSITIVITY)   ____________________________________________  EKG  ED ECG REPORT I, Merwyn Katos, the attending physician, personally viewed and interpreted this ECG.  Date: 10/07/2020 EKG Time: 2220 Rate: 124 Rhythm: Tachycardic sinus rhythm QRS Axis: normal Intervals: normal ST/T Wave abnormalities: normal Narrative Interpretation: no evidence of acute ischemia  ____________________________________________   PROCEDURES  Procedure(s) performed (including Critical Care):  .1-3 Lead EKG Interpretation Performed by: Merwyn Katos, MD Authorized by: Merwyn Katos, MD     Interpretation: abnormal  ECG rate:  117   ECG rate assessment: tachycardic     Rhythm: sinus tachycardia     Ectopy: none     Conduction: normal   .Critical Care Performed by: Merwyn Katos, MD Authorized by: Merwyn Katos, MD   Critical care provider statement:    Critical care time (minutes):  37   Critical care time was exclusive of:  Separately billable procedures and treating other patients   Critical care was necessary to treat or prevent imminent or life-threatening deterioration of the following conditions:  Endocrine crisis   Critical care was time spent personally  by me on the following activities:  Discussions with consultants, evaluation of patient's response to treatment, examination of patient, ordering and performing treatments and interventions, ordering and review of laboratory studies, ordering and review of radiographic studies, pulse oximetry, re-evaluation of patient's condition, obtaining history from patient or surrogate and review of old charts   I assumed direction of critical care for this patient from another provider in my specialty: no     Care discussed with: admitting provider       ____________________________________________   INITIAL IMPRESSION / ASSESSMENT AND PLAN / ED COURSE  As part of my medical decision making, I reviewed the following data within the electronic MEDICAL RECORD NUMBER Nursing notes reviewed and incorporated, Labs reviewed, EKG interpreted, Old chart reviewed, and Notes from prior ED visits reviewed and incorporated        Patients presentation most consistent with hyperglycemic state WITH evidence of DKA. Given Exam, History, and Workup I have low suspicion for an emergent precipitating factor of this hyperglycemic state such as atypical MI, acute abdomen, or other serious bacterial illness. Patient is type II diabetic with nonadherence in medication regimen/adherence. pH: 6.92  Patient started on continuous fluid boluses of LR, insulin drip, potassium chloride supplemented as needed given potassium level at each BMP Reassessment: Mental/respiratory status improving Dispo: Admit       ____________________________________________   FINAL CLINICAL IMPRESSION(S) / ED DIAGNOSES  Final diagnoses:  Chest pain, unspecified type  Shortness of breath  Diabetic ketoacidosis without coma associated with type 2 diabetes mellitus [facility])     ED Discharge Orders    None       Note:  This document was prepared using Dragon voice recognition software and may include unintentional dictation errors.    Merwyn Katos, MD 10/08/20 1719    Merwyn Katos, MD 11/07/20 1247

## 2020-10-08 ENCOUNTER — Inpatient Hospital Stay: Payer: Medicaid Other

## 2020-10-08 DIAGNOSIS — E0811 Diabetes mellitus due to underlying condition with ketoacidosis with coma: Secondary | ICD-10-CM

## 2020-10-08 DIAGNOSIS — G9341 Metabolic encephalopathy: Secondary | ICD-10-CM | POA: Diagnosis not present

## 2020-10-08 DIAGNOSIS — J9601 Acute respiratory failure with hypoxia: Secondary | ICD-10-CM

## 2020-10-08 DIAGNOSIS — R571 Hypovolemic shock: Secondary | ICD-10-CM

## 2020-10-08 DIAGNOSIS — E1110 Type 2 diabetes mellitus with ketoacidosis without coma: Secondary | ICD-10-CM | POA: Diagnosis not present

## 2020-10-08 LAB — URINALYSIS, COMPLETE (UACMP) WITH MICROSCOPIC
Bilirubin Urine: NEGATIVE
Glucose, UA: >=500 mg/dL — AB
Ketones, ur: 80 mg/dL — AB
Leukocytes,Ua: NEGATIVE
Nitrite: NEGATIVE
Protein, ur: 100 mg/dL — AB
Specific Gravity, Urine: 1.013 (ref 1.005–1.030)
pH: 5 (ref 5.0–8.0)

## 2020-10-08 LAB — BLOOD GAS, ARTERIAL
Acid-base deficit: 18.1 mmol/L — ABNORMAL HIGH (ref 0.0–2.0)
Acid-base deficit: 3.9 mmol/L — ABNORMAL HIGH (ref 0.0–2.0)
Bicarbonate: 8.9 mmol/L — ABNORMAL LOW (ref 20.0–28.0)
Bicarbonate: 21.6 mmol/L (ref 20.0–28.0)
FIO2: 0.6
FIO2: 0.3
MECHVT: 450 mL
MECHVT: 450 mL
O2 Saturation: 99.5 %
O2 Saturation: 94.6 %
PEEP: 5 cmH2O
PEEP: 5 cmH2O
Patient temperature: 37
Patient temperature: 37
RATE: 20 resp/min
RATE: 20 resp/min
pCO2 arterial: 25 mmHg — ABNORMAL LOW (ref 32.0–48.0)
pCO2 arterial: 40 mmHg (ref 32.0–48.0)
pH, Arterial: 7.16 — CL (ref 7.350–7.450)
pH, Arterial: 7.34 — ABNORMAL LOW (ref 7.350–7.450)
pO2, Arterial: 199 mmHg — ABNORMAL HIGH (ref 83.0–108.0)
pO2, Arterial: 78 mmHg — ABNORMAL LOW (ref 83.0–108.0)

## 2020-10-08 LAB — GLUCOSE, CAPILLARY
Glucose-Capillary: 113 mg/dL — ABNORMAL HIGH (ref 70–99)
Glucose-Capillary: 164 mg/dL — ABNORMAL HIGH (ref 70–99)
Glucose-Capillary: 165 mg/dL — ABNORMAL HIGH (ref 70–99)
Glucose-Capillary: 176 mg/dL — ABNORMAL HIGH (ref 70–99)
Glucose-Capillary: 183 mg/dL — ABNORMAL HIGH (ref 70–99)
Glucose-Capillary: 188 mg/dL — ABNORMAL HIGH (ref 70–99)
Glucose-Capillary: 196 mg/dL — ABNORMAL HIGH (ref 70–99)
Glucose-Capillary: 196 mg/dL — ABNORMAL HIGH (ref 70–99)
Glucose-Capillary: 198 mg/dL — ABNORMAL HIGH (ref 70–99)
Glucose-Capillary: 208 mg/dL — ABNORMAL HIGH (ref 70–99)
Glucose-Capillary: 215 mg/dL — ABNORMAL HIGH (ref 70–99)
Glucose-Capillary: 217 mg/dL — ABNORMAL HIGH (ref 70–99)
Glucose-Capillary: 219 mg/dL — ABNORMAL HIGH (ref 70–99)
Glucose-Capillary: 232 mg/dL — ABNORMAL HIGH (ref 70–99)
Glucose-Capillary: 311 mg/dL — ABNORMAL HIGH (ref 70–99)
Glucose-Capillary: 344 mg/dL — ABNORMAL HIGH (ref 70–99)
Glucose-Capillary: 426 mg/dL — ABNORMAL HIGH (ref 70–99)
Glucose-Capillary: >600 mg/dL (ref 70–99)

## 2020-10-08 LAB — CBC
HCT: 48.2 % — ABNORMAL HIGH (ref 36.0–46.0)
HCT: 35.7 % — ABNORMAL LOW (ref 36.0–46.0)
Hemoglobin: 15.7 g/dL — ABNORMAL HIGH (ref 12.0–15.0)
Hemoglobin: 12.8 g/dL (ref 12.0–15.0)
MCH: 31.2 pg (ref 26.0–34.0)
MCH: 31.5 pg (ref 26.0–34.0)
MCHC: 32.6 g/dL (ref 30.0–36.0)
MCHC: 35.9 g/dL (ref 30.0–36.0)
MCV: 95.6 fL (ref 80.0–100.0)
MCV: 87.9 fL (ref 80.0–100.0)
Platelets: 413 10*3/uL — ABNORMAL HIGH (ref 150–400)
Platelets: 316 10*3/uL (ref 150–400)
RBC: 5.04 MIL/uL (ref 3.87–5.11)
RBC: 4.06 MIL/uL (ref 3.87–5.11)
RDW: 13.4 % (ref 11.5–15.5)
RDW: 12.5 % (ref 11.5–15.5)
WBC: 22.2 10*3/uL — ABNORMAL HIGH (ref 4.0–10.5)
WBC: 19.7 10*3/uL — ABNORMAL HIGH (ref 4.0–10.5)
nRBC: 0 % (ref 0.0–0.2)
nRBC: 0 % (ref 0.0–0.2)

## 2020-10-08 LAB — BASIC METABOLIC PANEL
Anion gap: 15 (ref 5–15)
Anion gap: 10 (ref 5–15)
Anion gap: 9 (ref 5–15)
Anion gap: 9 (ref 5–15)
Anion gap: 6 (ref 5–15)
Anion gap: 6 (ref 5–15)
BUN: 23 mg/dL — ABNORMAL HIGH (ref 6–20)
BUN: 20 mg/dL (ref 6–20)
BUN: 20 mg/dL (ref 6–20)
BUN: 19 mg/dL (ref 6–20)
BUN: 17 mg/dL (ref 6–20)
BUN: 15 mg/dL (ref 6–20)
CO2: 12 mmol/L — ABNORMAL LOW (ref 22–32)
CO2: 21 mmol/L — ABNORMAL LOW (ref 22–32)
CO2: 22 mmol/L (ref 22–32)
CO2: 22 mmol/L (ref 22–32)
CO2: 25 mmol/L (ref 22–32)
CO2: 25 mmol/L (ref 22–32)
Calcium: 8.1 mg/dL — ABNORMAL LOW (ref 8.9–10.3)
Calcium: 8.2 mg/dL — ABNORMAL LOW (ref 8.9–10.3)
Calcium: 8.3 mg/dL — ABNORMAL LOW (ref 8.9–10.3)
Calcium: 8.1 mg/dL — ABNORMAL LOW (ref 8.9–10.3)
Calcium: 8.3 mg/dL — ABNORMAL LOW (ref 8.9–10.3)
Calcium: 8.1 mg/dL — ABNORMAL LOW (ref 8.9–10.3)
Chloride: 111 mmol/L (ref 98–111)
Chloride: 110 mmol/L (ref 98–111)
Chloride: 109 mmol/L (ref 98–111)
Chloride: 109 mmol/L (ref 98–111)
Chloride: 109 mmol/L (ref 98–111)
Chloride: 109 mmol/L (ref 98–111)
Creatinine, Ser: 1.69 mg/dL — ABNORMAL HIGH (ref 0.44–1.00)
Creatinine, Ser: 1.12 mg/dL — ABNORMAL HIGH (ref 0.44–1.00)
Creatinine, Ser: 1.17 mg/dL — ABNORMAL HIGH (ref 0.44–1.00)
Creatinine, Ser: 0.99 mg/dL (ref 0.44–1.00)
Creatinine, Ser: 0.95 mg/dL (ref 0.44–1.00)
Creatinine, Ser: 0.8 mg/dL (ref 0.44–1.00)
GFR, Estimated: 35 mL/min — ABNORMAL LOW (ref >60)
GFR, Estimated: 57 mL/min — ABNORMAL LOW (ref >60)
GFR, Estimated: 54 mL/min — ABNORMAL LOW (ref >60)
GFR, Estimated: >60 mL/min (ref >60)
GFR, Estimated: >60 mL/min (ref >60)
GFR, Estimated: >60 mL/min (ref >60)
Glucose, Bld: 376 mg/dL — ABNORMAL HIGH (ref 70–99)
Glucose, Bld: 204 mg/dL — ABNORMAL HIGH (ref 70–99)
Glucose, Bld: 206 mg/dL — ABNORMAL HIGH (ref 70–99)
Glucose, Bld: 181 mg/dL — ABNORMAL HIGH (ref 70–99)
Glucose, Bld: 185 mg/dL — ABNORMAL HIGH (ref 70–99)
Glucose, Bld: 169 mg/dL — ABNORMAL HIGH (ref 70–99)
Potassium: 3.9 mmol/L (ref 3.5–5.1)
Potassium: 3.4 mmol/L — ABNORMAL LOW (ref 3.5–5.1)
Potassium: 3.4 mmol/L — ABNORMAL LOW (ref 3.5–5.1)
Potassium: 3.5 mmol/L (ref 3.5–5.1)
Potassium: 2.9 mmol/L — ABNORMAL LOW (ref 3.5–5.1)
Potassium: 3.5 mmol/L (ref 3.5–5.1)
Sodium: 138 mmol/L (ref 135–145)
Sodium: 141 mmol/L (ref 135–145)
Sodium: 140 mmol/L (ref 135–145)
Sodium: 140 mmol/L (ref 135–145)
Sodium: 140 mmol/L (ref 135–145)
Sodium: 140 mmol/L (ref 135–145)

## 2020-10-08 LAB — HEMOGLOBIN A1C
Hgb A1c MFr Bld: 13.6 % — ABNORMAL HIGH (ref 4.8–5.6)
Mean Plasma Glucose: 343.62 mg/dL

## 2020-10-08 LAB — BETA-HYDROXYBUTYRIC ACID
Beta-Hydroxybutyric Acid: 6.16 mmol/L — ABNORMAL HIGH (ref 0.05–0.27)
Beta-Hydroxybutyric Acid: 2.88 mmol/L — ABNORMAL HIGH (ref 0.05–0.27)
Beta-Hydroxybutyric Acid: 0.47 mmol/L — ABNORMAL HIGH (ref 0.05–0.27)

## 2020-10-08 LAB — URINE DRUG SCREEN, QUALITATIVE (ARMC ONLY)
Amphetamines, Ur Screen: NOT DETECTED
Barbiturates, Ur Screen: NOT DETECTED
Benzodiazepine, Ur Scrn: NOT DETECTED
Cannabinoid 50 Ng, Ur ~~LOC~~: NOT DETECTED
Cocaine Metabolite,Ur ~~LOC~~: POSITIVE — AB
MDMA (Ecstasy)Ur Screen: NOT DETECTED
Methadone Scn, Ur: NOT DETECTED
Opiate, Ur Screen: NOT DETECTED
Phencyclidine (PCP) Ur S: NOT DETECTED
Tricyclic, Ur Screen: NOT DETECTED

## 2020-10-08 LAB — RESP PANEL BY RT-PCR (FLU A&B, COVID) ARPGX2
Influenza A by PCR: NEGATIVE
Influenza B by PCR: NEGATIVE
SARS Coronavirus 2 by RT PCR: NEGATIVE

## 2020-10-08 LAB — MRSA PCR SCREENING: MRSA by PCR: NEGATIVE

## 2020-10-08 LAB — PHOSPHORUS: Phosphorus: 4.3 mg/dL (ref 2.5–4.6)

## 2020-10-08 LAB — CBG MONITORING, ED: Glucose-Capillary: 580 mg/dL (ref 70–99)

## 2020-10-08 LAB — MAGNESIUM: Magnesium: 1.8 mg/dL (ref 1.7–2.4)

## 2020-10-08 LAB — PROCALCITONIN: Procalcitonin: 0.17 ng/mL

## 2020-10-08 LAB — TROPONIN I (HIGH SENSITIVITY): Troponin I (High Sensitivity): 16 ng/L (ref <18)

## 2020-10-08 MED ORDER — NOREPINEPHRINE 16 MG/250ML-% IV SOLN
0.0000 ug/min | INTRAVENOUS | Status: DC
  Administered 2020-10-08: 20 ug/min via INTRAVENOUS
  Filled 2020-10-08: qty 250

## 2020-10-08 MED ORDER — SODIUM CHLORIDE 0.9 % IV BOLUS
1000.0000 mL | Freq: Once | INTRAVENOUS | Status: AC
  Administered 2020-10-08: 1000 mL via INTRAVENOUS

## 2020-10-08 MED ORDER — SODIUM BICARBONATE 8.4 % IV SOLN
50.0000 meq | Freq: Once | INTRAVENOUS | Status: AC
  Administered 2020-10-08: 50 meq via INTRAVENOUS

## 2020-10-08 MED ORDER — FENTANYL 2500MCG IN NS 250ML (10MCG/ML) PREMIX INFUSION
INTRAVENOUS | Status: AC
  Administered 2020-10-08: 50 ug/h via INTRAVENOUS
  Filled 2020-10-08: qty 250

## 2020-10-08 MED ORDER — POTASSIUM CHLORIDE 10 MEQ/100ML IV SOLN
10.0000 meq | INTRAVENOUS | Status: AC
  Administered 2020-10-08 (×4): 10 meq via INTRAVENOUS
  Filled 2020-10-08 (×4): qty 100

## 2020-10-08 MED ORDER — FENTANYL CITRATE (PF) 100 MCG/2ML IJ SOLN
100.0000 ug | Freq: Once | INTRAMUSCULAR | Status: AC
  Administered 2020-10-08: 100 ug via INTRAVENOUS
  Filled 2020-10-08: qty 2

## 2020-10-08 MED ORDER — DOCUSATE SODIUM 100 MG PO CAPS
100.0000 mg | ORAL_CAPSULE | Freq: Two times a day (BID) | ORAL | Status: DC
  Administered 2020-10-09: 21:00:00 100 mg via ORAL
  Filled 2020-10-08 (×3): qty 1

## 2020-10-08 MED ORDER — LACTATED RINGERS IV BOLUS
1000.0000 mL | Freq: Once | INTRAVENOUS | Status: AC
  Administered 2020-10-08: 1000 mL via INTRAVENOUS

## 2020-10-08 MED ORDER — MIDAZOLAM BOLUS VIA INFUSION: 2.0000 mg | Freq: Once | INTRAVENOUS | Status: DC

## 2020-10-08 MED ORDER — POLYETHYLENE GLYCOL 3350 17 G PO PACK
17.0000 g | PACK | Freq: Every day | ORAL | Status: DC
  Filled 2020-10-08 (×2): qty 1

## 2020-10-08 MED ORDER — CHLORHEXIDINE GLUCONATE 0.12% ORAL RINSE (MEDLINE KIT)
15.0000 mL | Freq: Two times a day (BID) | OROMUCOSAL | Status: DC
  Administered 2020-10-08 – 2020-10-09 (×3): 15 mL via OROMUCOSAL

## 2020-10-08 MED ORDER — SODIUM CHLORIDE 0.9 % IV SOLN
250.0000 mL | INTRAVENOUS | Status: DC
  Administered 2020-10-10: 250 mL via INTRAVENOUS

## 2020-10-08 MED ORDER — LABETALOL HCL 5 MG/ML IV SOLN
20.0000 mg | INTRAVENOUS | Status: DC | PRN
  Administered 2020-10-08: 20 mg via INTRAVENOUS
  Filled 2020-10-08: qty 4

## 2020-10-08 MED ORDER — DEXTROSE IN LACTATED RINGERS 5 % IV SOLN: INTRAVENOUS | Status: DC

## 2020-10-08 MED ORDER — PROPOFOL 1000 MG/100ML IV EMUL
0.0000 ug/kg/min | INTRAVENOUS | Status: DC
  Administered 2020-10-08: 40 ug/kg/min via INTRAVENOUS
  Administered 2020-10-08: 50 ug/kg/min via INTRAVENOUS
  Administered 2020-10-08: 60 ug/kg/min via INTRAVENOUS
  Filled 2020-10-08 (×4): qty 100

## 2020-10-08 MED ORDER — MIDAZOLAM HCL 2 MG/2ML IJ SOLN
2.0000 mg | Freq: Once | INTRAMUSCULAR | Status: AC
  Administered 2020-10-08: 2 mg via INTRAVENOUS

## 2020-10-08 MED ORDER — FENTANYL 2500MCG IN NS 250ML (10MCG/ML) PREMIX INFUSION: 50.0000 ug/h | INTRAVENOUS | Status: DC

## 2020-10-08 MED ORDER — SODIUM BICARBONATE 8.4 % IV SOLN
100.0000 meq | Freq: Once | INTRAVENOUS | Status: AC
  Administered 2020-10-08: 100 meq via INTRAVENOUS
  Filled 2020-10-08: qty 100

## 2020-10-08 MED ORDER — FENTANYL BOLUS VIA INFUSION
50.0000 ug | INTRAVENOUS | Status: DC | PRN
Start: 2020-10-08 — End: 2020-10-08
  Filled 2020-10-08: qty 100

## 2020-10-08 MED ORDER — MAGNESIUM SULFATE 2 GM/50ML IV SOLN
2.0000 g | Freq: Once | INTRAVENOUS | Status: AC
  Administered 2020-10-08: 2 g via INTRAVENOUS
  Filled 2020-10-08: qty 50

## 2020-10-08 MED ORDER — FAMOTIDINE IN NACL 20-0.9 MG/50ML-% IV SOLN
20.0000 mg | Freq: Two times a day (BID) | INTRAVENOUS | Status: DC
  Administered 2020-10-08 – 2020-10-09 (×4): 20 mg via INTRAVENOUS
  Filled 2020-10-08 (×6): qty 50

## 2020-10-08 MED ORDER — CHLORHEXIDINE GLUCONATE CLOTH 2 % EX PADS
6.0000 | MEDICATED_PAD | Freq: Every day | CUTANEOUS | Status: DC
  Administered 2020-10-08: 6 via TOPICAL

## 2020-10-08 MED ORDER — FENTANYL CITRATE (PF) 100 MCG/2ML IJ SOLN: 50.0000 ug | Freq: Once | INTRAMUSCULAR | Status: DC

## 2020-10-08 MED ORDER — MORPHINE SULFATE (PF) 2 MG/ML IV SOLN: 2.0000 mg | Freq: Once | INTRAVENOUS | Status: DC

## 2020-10-08 MED ORDER — ROCURONIUM BROMIDE 50 MG/5ML IV SOLN
50.0000 mg | Freq: Once | INTRAVENOUS | Status: AC
  Administered 2020-10-08: 50 mg via INTRAVENOUS
  Filled 2020-10-08: qty 1

## 2020-10-08 MED ORDER — INSULIN ASPART 100 UNIT/ML ~~LOC~~ SOLN
0.0000 [IU] | SUBCUTANEOUS | Status: DC
  Administered 2020-10-08: 1 [IU] via SUBCUTANEOUS
  Administered 2020-10-09 (×2): 2 [IU] via SUBCUTANEOUS
  Administered 2020-10-09: 3 [IU] via SUBCUTANEOUS
  Filled 2020-10-08 (×4): qty 1

## 2020-10-08 MED ORDER — MIDAZOLAM HCL 2 MG/2ML IJ SOLN
INTRAMUSCULAR | Status: AC
  Filled 2020-10-08: qty 2

## 2020-10-08 MED ORDER — ETOMIDATE 2 MG/ML IV SOLN
20.0000 mg | Freq: Once | INTRAVENOUS | Status: AC
  Administered 2020-10-08: 20 mg via INTRAVENOUS
  Filled 2020-10-08: qty 10

## 2020-10-08 MED ORDER — INSULIN GLARGINE 100 UNIT/ML ~~LOC~~ SOLN
25.0000 [IU] | Freq: Every day | SUBCUTANEOUS | Status: DC
  Administered 2020-10-08 – 2020-10-09 (×2): 25 [IU] via SUBCUTANEOUS
  Filled 2020-10-08 (×2): qty 0.25

## 2020-10-08 MED ORDER — NOREPINEPHRINE 4 MG/250ML-% IV SOLN
2.0000 ug/min | INTRAVENOUS | Status: DC
  Administered 2020-10-08: 2 ug/min via INTRAVENOUS
  Filled 2020-10-08: qty 250

## 2020-10-08 MED ORDER — IPRATROPIUM-ALBUTEROL 0.5-2.5 (3) MG/3ML IN SOLN: 3.0000 mL | RESPIRATORY_TRACT | Status: DC | PRN

## 2020-10-08 MED ORDER — DOCUSATE SODIUM 50 MG/5ML PO LIQD
100.0000 mg | Freq: Two times a day (BID) | ORAL | Status: DC
  Administered 2020-10-08: 100 mg
  Filled 2020-10-08: qty 10

## 2020-10-08 MED ORDER — SODIUM BICARBONATE 8.4 % IV SOLN
INTRAVENOUS | Status: DC
  Filled 2020-10-08 (×5): qty 850

## 2020-10-08 MED ORDER — ORAL CARE MOUTH RINSE
15.0000 mL | OROMUCOSAL | Status: DC
  Administered 2020-10-08 – 2020-10-09 (×7): 15 mL via OROMUCOSAL

## 2020-10-08 MED ORDER — LABETALOL HCL 5 MG/ML IV SOLN: 20.0000 mg | INTRAVENOUS | Status: DC | PRN

## 2020-10-08 MED ORDER — SODIUM BICARBONATE 8.4 % IV SOLN
100.0000 meq | Freq: Once | INTRAVENOUS | Status: AC
  Administered 2020-10-08: 100 meq via INTRAVENOUS

## 2020-10-08 MED ORDER — STERILE WATER FOR INJECTION IV SOLN
INTRAVENOUS | Status: DC
  Filled 2020-10-08 (×4): qty 850

## 2020-10-08 MED ORDER — POLYETHYLENE GLYCOL 3350 17 G PO PACK
17.0000 g | PACK | Freq: Every day | ORAL | Status: DC
  Administered 2020-10-08: 17 g
  Filled 2020-10-08: qty 1

## 2020-10-08 MED ORDER — LACTATED RINGERS IV SOLN: INTRAVENOUS | Status: DC

## 2020-10-08 NOTE — Progress Notes (Signed)
[employer] to stepdonw unit with patietn sarrival to floor secondary to kussmaul breathing, and severe acidosis with pH6.9 Based on labs reviewed and prior treatment to this point,2 liters of normal saline and  3 amps sodium bicarb to given stat, discussed possible need for intubation with patient. Discussed patient with ICU NP over phone. Pateint continued to decompensate with decreased mental status and hypotension. Allena Katz NP again discussed with patient intubation and plan for intubation

## 2020-10-08 NOTE — Progress Notes (Signed)
Critical care note:  Date of note: 10/08/2020  Subjective: Shortly after the patient arrived to the ICU she was getting more lethargic with increasing Kussmaul's breathing.  Her last BMP was hemolyzed and she was a difficult stick and not very cooperative with the phlebotomy team and the nursing staff in the ER.  Her blood pressure has come down from 170/85 to 93/50 and heart rate from 116 to 86.  Respiratory rate was 21 and temperature was 94.5 axillary earlier.  Pulse ox 90 was 9200% on room air.  Objective: Physical examination: Generally: Acutely ill, obtunded middle-aged African-American female in moderate respiratory distress with Kussmaul's breathing Vital signs as above, blood pressure later dropped to 75 systolic. Head - atraumatic, normocephalic.  Pupils - equal, round and reactive to light and accommodation. Extraocular movements are intact. No scleral icterus.  Oropharynx -dry mucous membranes and tongue. No pharyngeal erythema or exudate.  Neck - supple. No JVD. Carotid pulses 2+ bilaterally. No carotid bruits. No palpable thyromegaly or lymphadenopathy. Cardiovascular - regular rate and rhythm. Normal S1 and S2. No murmurs, gallops or rubs.  Lungs - clear to auscultation bilaterally.  Breathing was rapid and shallow though. Abdomen - soft and nontender. Positive bowel sounds. No palpable organomegaly or masses.  Extremities - no pitting edema, clubbing or cyanosis.  Neuro - grossly non-focal. Skin - no rashes. Breast, pelvic and rectal - deferred.  Assessment/plan: 1.  Severe DKA with worsening acute metabolic encephalopathy. -Given deteriorating mental status and concern about inability to protect airways, decision was made to give the patient RSI and intubate her. -The patient was given RSI with IV etomidate, rocuronium and fentanyl then was intubated via glidoscope with a 7.42F ET tube at 22 cm by Mr. Elvina Sidle. -Will be placed on ventilator protocol. -She will be continued on  aggressive hydration. -She will be continued on IV insulin therapy per Endo tool. -PCCM consult was placed and case was discussed with the ICU team.  2.  Shock, likely hypovolemic. -Her urinalysis and chest x-ray were unremarkable.  No clear infectious etiology at this time. -The patient was hypotensive despite initial boluses of IV fluids. -Prior to intubation she was started on IV pressor therapy with Levophed with improvement of her blood pressure to 111/57 and heart rate was 87. -She will be continued on aggressive IV fluids hydration.  3.  Acute kidney injury. -BMPs will be followed with hydration.  Authorized and performed by: Valente David, MD Total critical care time: Approximately   30    minutes. Due to a high probability of clinically significant, life-threatening deterioration, the patient required my highest level of preparedness to intervene emergently and I personally spent this critical care time directly and personally managing the patient.  This critical care time included obtaining a history, examining the patient, pulse oximetry, ordering and review of studies, arranging urgent treatment with development of management plan, evaluation of patient's response to treatment, frequent reassessment, and discussions with other providers. This critical care time was performed to assess and manage the high probability of imminent, life-threatening deterioration that could result in multiorgan failure.  It was exclusive of separately billable procedures and treating other patients and teaching time.  Please see MDM section and the rest of the note for further information on patient assessment and treatment.

## 2020-10-08 NOTE — Progress Notes (Signed)
Levophed drip titrated off around 1830. Patient arouses to voice but remains drowsy. Insulin drip to be turned off around 1940.

## 2020-10-08 NOTE — Procedures (Signed)
Central Venous Catheter Insertion Procedure Note  Pamela Lane  [id number]  June 14, 1961  Date:10/08/20  Time:4:13 AM   Provider Performing:Deaaron Fulghum D Elvina Sidle   Procedure: Insertion of Non-tunneled Central Venous Catheter(36556) with US guidance (92446)   Indication(s) Medication administration and Difficult access  Consent Unable to obtain consent due to emergent nature of procedure.  Anesthesia Topical only with 1% lidocaine   Timeout Verified patient identification, verified procedure, site/side was marked, verified correct patient position, special equipment/implants available, medications/allergies/relevant history reviewed, required imaging and test results available.  Sterile Technique Maximal sterile technique including full sterile barrier drape, hand hygiene, sterile gown, sterile gloves, mask, hair covering, sterile ultrasound probe cover (if used).  Procedure Description Area of catheter insertion was cleaned with chlorhexidine and draped in sterile fashion.  With real-time ultrasound guidance a central venous catheter was placed into the left internal jugular vein. Nonpulsatile blood flow and easy flushing noted in all ports.  The catheter was sutured in place and sterile dressing applied.  Complications/Tolerance None; patient tolerated the procedure well. Chest X-ray is ordered to verify placement for internal jugular or subclavian cannulation.   Chest x-ray is not ordered for femoral cannulation.  EBL Minimal  Specimen(s) None   Line secured at the 19 cm mark.  BIOPATCH applied to the insertion site.   Harlon Ditty, AGACNP-BC [facility] Pager: [phone]

## 2020-10-08 NOTE — Procedures (Signed)
Intubation Procedure Note  Pamela Lane  [id number]  1961-01-28  Date:10/08/20  Time:3:18 AM   Provider Performing:Shaylynne Lunt D Elvina Sidle    Procedure: Intubation (31500)  Indication(s) Respiratory Failure  Consent Risks of the procedure as well as the alternatives and risks of each were explained to the patient and/or caregiver.  Consent for the procedure was obtained and is signed in the bedside chart   Anesthesia Etomidate, Fentanyl and Rocuronium   Time Out Verified patient identification, verified procedure, site/side was marked, verified correct patient position, special equipment/implants available, medications/allergies/relevant history reviewed, required imaging and test results available.   Sterile Technique Usual hand hygeine, masks, and gloves were used   Procedure Description Patient positioned in bed supine.  Sedation given as noted above.  Patient was intubated with endotracheal tube using Glidescope.  View was Grade 1 full glottis .  Number of attempts was 1.  Colorimetric CO2 detector was consistent with tracheal placement.   Complications/Tolerance None; patient tolerated the procedure well. Chest X-ray is ordered to verify placement.   EBL Minimal   Specimen(s) None  Size 7.5 ETT  Secured at 22 cm at the lip.   Harlon Ditty, AGACNP-BC [facility] Pager: [phone]

## 2020-10-08 NOTE — Consult Note (Signed)
[facility] Consultation Note  Patient ID: Pamela Lane, MRN: [id number], DOB/AGE: 1961/05/19 60 y.o. Admit date: 10/07/2020   Date of Consult: 10/08/2020 Primary Physician: [facility] [place] Primary Cardiologist: None  Chief Complaint:  Chief Complaint  Patient presents with  . Chest Pain   Reason for Consult: Supraventricular tachycardia  HPI: 60 y.o. female with known hypertension hyperlipidemia diabetes with acute diabetic ketoacidosis and acute kidney injuries for which the patient had a significant event in the emergency room.  At that time the patient got very short of breath and had respiratory failure and required treatment with ventilation.  At that time the patient also had a very rapid heart rate and supraventricular tachycardia.  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  This was treated with adenosine which then changed to sinus tachycardia in the 120 bpm range.  After further treatment of the patient's primary illness the patient has had slower heart rates at this time down to 95 bpm.  There has been EKG changes consistent with normal sinus rhythm with left ventricular hypertrophy and nonspecific ST and T wave changes.  Troponin has been 11.  There is been no evidence of acute coronary syndrome or congestive heart failure with chest x-ray appearing to be without evidence of pulmonary edema.  The patient has had some hypotension which has been treated at this time and now is improved.  Patient is awake and alert and slightly more hemodynamically stable with medications listed below.  Past Medical History:  Diagnosis Date  . Asthma   . Clostridium difficile colitis   . Diabetes mellitus without complication ([place])   . Hypertension   . Mental disorder    pt reports 'I have all of them mental disorders'      Surgical History:  Past Surgical History:  Procedure Laterality Date  . ABDOMINAL HYSTERECTOMY       Home  Meds: Prior to Admission medications   Medication Sig Start Date End Date Taking? Authorizing Provider  albuterol (PROVENTIL HFA;VENTOLIN HFA) 108 (90 Base) MCG/ACT inhaler Inhale 2 puffs into the lungs every 6 (six) hours as needed for wheezing or shortness of breath. 02/18/18  Yes Dustin Flock, MD  aspirin EC 81 MG tablet Take 81 mg by mouth daily.   Yes [provider]  atorvastatin (LIPITOR) 40 MG tablet Take 1 tablet (40 mg total) by mouth at bedtime. 08/29/20 11/27/20 Yes Hall, Carole N, DO  buPROPion (WELLBUTRIN SR) 150 MG 12 hr tablet Take 150 mg by mouth 2 (two) times daily. 10/01/20  Yes [provider]  diltiazem (CARDIZEM CD) 120 MG 24 hr capsule Take 1 capsule (120 mg total) by mouth daily. 08/29/20 09/28/20 Yes Kayleen Memos, DO  folic acid (FOLVITE) 1 MG tablet Take 1 tablet (1 mg total) by mouth daily. 03/08/18  Yes Gouru, Illene Silver, MD  gabapentin (NEURONTIN) 100 MG capsule Take 1 capsule (100 mg total) by mouth 2 (two) times daily. 08/29/20 11/27/20 Yes Hall, Carole N, DO  insulin glargine (LANTUS) 100 UNIT/ML Solostar Pen Inject 50 Units into the skin daily. 08/29/20 11/27/20 Yes Hall, Carole N, DO  lisinopril (ZESTRIL) 10 MG tablet Take 10 mg by mouth daily. 10/01/20  Yes [provider]  metFORMIN (GLUCOPHAGE-XR) 500 MG 24 hr tablet Take 2 tablets (1,000 mg total) by mouth daily with breakfast. 08/29/20 11/27/20 Yes Kayleen Memos, DO  Multiple Vitamin (MULTIVITAMIN WITH MINERALS) TABS tablet Take 1 tablet by mouth daily. 03/08/18  Yes Gouru, Aruna, MD  NOVOLOG FLEXPEN 100 UNIT/ML FlexPen Inject 10 Units into the skin 3 (three) times daily with meals. 08/29/20 11/27/20 Yes Kayleen Memos, DO  vitamin B-12 (CYANOCOBALAMIN) 500 MCG tablet Take 500 mcg by mouth daily.   Yes [provider]  buPROPion (ZYBAN) 150 MG 12 hr tablet Take 150 mg by mouth 2 (two) times daily. Patient not taking: Reported on 10/08/2020 05/21/20   [provider]  cetirizine  (ZYRTEC) 10 MG tablet Take 10 mg by mouth daily. Patient not taking: Reported on 10/08/2020 02/09/20   [provider]  thiamine 100 MG tablet Take 1 tablet (100 mg total) by mouth daily. Patient not taking: Reported on 10/08/2020 03/08/18   Nicholes Mango, MD    Inpatient Medications:  . atorvastatin  40 mg Oral QHS  . buPROPion  150 mg Oral BID  . chlorhexidine gluconate (MEDLINE KIT)  15 mL Mouth Rinse BID  . Chlorhexidine Gluconate Cloth  6 each Topical Q0600  . diltiazem  120 mg Oral Daily  . docusate sodium  100 mg Oral BID  . enoxaparin (LOVENOX) injection  40 mg Subcutaneous Q24H  . folic acid  1 mg Oral Daily  . gabapentin  100 mg Oral BID  . insulin aspart  0-6 Units Subcutaneous Q4H  . insulin glargine  25 Units Subcutaneous Daily  . loratadine  10 mg Oral Daily  . mouth rinse  15 mL Mouth Rinse 10 times per day  . multivitamin with minerals  1 tablet Oral Daily  . [START ON 10/09/2020] polyethylene glycol  17 g Oral Daily  . thiamine  100 mg Oral Daily   . sodium chloride Stopped (10/08/20 0450)  . dextrose 5% lactated ringers 125 mL/hr at 10/08/20 1700  . famotidine (PEPCID) IV Stopped (10/08/20 1145)  . insulin 3.2 mL/hr at 10/08/20 1700  . norepinephrine (LEVOPHED) Adult infusion 3 mcg/min (10/08/20 1700)  . potassium chloride      Allergies: No Known Allergies  Social History   Socioeconomic History  . Marital status: Single    Spouse name: Not on file  . Number of children: Not on file  . Years of education: Not on file  . Highest education level: Not on file  Occupational History  . Not on file  Tobacco Use  . Smoking status: Current Every Day Smoker    Packs/day: 0.25    Types: Cigarettes  . Smokeless tobacco: Never Used  Vaping Use  . Vaping Use: Never used  Substance and Sexual Activity  . Alcohol use: Yes    Alcohol/week: 40.0 standard drinks    Types: 40 Cans of beer per week    Comment: 3 big bottles of liquor  . Drug use: Yes     Types: Marijuana, Cocaine  . Sexual activity: Not on file  Other Topics Concern  . Not on file  Social History Narrative  . Not on file   Social Determinants of Health   Financial Resource Strain: Not on file  Food Insecurity: Not on file  Transportation Needs: Not on file  Physical Activity: Not on file  Stress: Not on file  Social Connections: Not on file  Intimate Partner Violence: Not on file     Family History  Problem Relation Age of Onset  . Hypertension Mother      Review of Systems Positive for shortness of breath Negative for: General:  chills, fever, night sweats or weight changes.  Cardiovascular: PND orthopnea syncope dizziness  Dermatological skin lesions rashes Respiratory: Cough congestion Urologic: Frequent urination urination at night and hematuria Abdominal: negative for nausea, vomiting, diarrhea, bright red blood per rectum, melena, or hematemesis Neurologic: negative for visual changes, and/or hearing changes  All other systems reviewed and are otherwise negative except as noted above.  Labs: No results for input(s): CKTOTAL, CKMB, TROPONINI in the last 72 hours. Lab Results  Component Value Date   WBC 19.7 (H) 10/08/2020   HGB 12.8 10/08/2020   HCT 35.7 (L) 10/08/2020   MCV 87.9 10/08/2020   PLT 316 10/08/2020    Recent Labs  Lab 10/07/20 2219 10/08/20 0423 10/08/20 1548  NA 129*   < > 140  K 5.1   < > 2.9*  CL 99   < > 109  CO2 <7*   < > 25  BUN 19   < > 17  CREATININE 1.77*   < > 0.95  CALCIUM 9.0   < > 8.3*  PROT 7.9  --   --   BILITOT 1.5*  --   --   ALKPHOS 98  --   --   ALT 13  --   --   AST 14*  --   --   GLUCOSE 642*   < > 185*   < > = values in this interval not displayed.   Lab Results  Component Value Date   CHOL 153 05/16/2016   HDL 38 (L) 05/16/2016   LDLCALC 92 05/16/2016   TRIG 117 05/16/2016   Lab Results  Component Value Date   DDIMER  09/05/2010    0.37        AT THE INHOUSE ESTABLISHED CUTOFF VALUE  OF 0.48 ug/mL FEU, THIS ASSAY HAS BEEN DOCUMENTED IN THE LITERATURE TO HAVE A SENSITIVITY AND NEGATIVE PREDICTIVE VALUE OF AT LEAST 98 TO 99%.  THE TEST RESULT SHOULD BE CORRELATED WITH AN ASSESSMENT OF THE CLINICAL PROBABILITY OF DVT / VTE.    Radiology/Studies:  DG Abd 1 View  Result Date: 10/08/2020 CLINICAL DATA:  Nasogastric tube placement EXAM: ABDOMEN - 1 VIEW COMPARISON:  None. FINDINGS: Nasogastric tube tip within the expected distal body of the stomach. Visualized abdominal gas pattern is normal. IMPRESSION: Nasogastric tube tip within the distal body of the stomach. Electronically Signed   By: Fidela Salisbury MD   On: 10/08/2020 04:43   DG Chest Port 1 View  Result Date: 10/08/2020 CLINICAL DATA:  Respiratory failure EXAM: PORTABLE CHEST 1 VIEW COMPARISON:  None. FINDINGS: Endotracheal tube is seen 2.8 cm above the carina. Nasogastric tube extends into the upper abdomen. Left internal jugular central venous catheter tip noted within the superior vena cava. Pulmonary insufflation has decreased. Lungs are clear. No pneumothorax or pleural effusion. Cardiac size within normal limits. Pulmonary vascularity is normal. IMPRESSION: Support tubes in appropriate position. In left IJ CVC tip within the superior vena cava.  No pneumothorax. Progressive pulmonary hypoinflation. Electronically Signed   By: Fidela Salisbury MD   On: 10/08/2020 04:43   DG Chest Port 1 View  Result Date: 10/07/2020 CLINICAL DATA:  Generalized chest pain for 3 days, headache EXAM: PORTABLE CHEST 1 VIEW COMPARISON:  03/13/2020 FINDINGS: Single frontal view of the chest demonstrates an unremarkable cardiac silhouette. External defibrillator pads overlie the cardiac silhouette. No airspace disease, effusion, or pneumothorax. No acute bony abnormalities. IMPRESSION: 1. No acute intrathoracic process. Electronically Signed   By: Randa Ngo M.D.   On: 10/07/2020 23:57    EKG: Supraventricular tachycardia consistent  with AV node reentrant tachycardia now normal sinus rhythm with left ventricular hypertrophy  Weights: Filed Weights   10/08/20 0230  Weight: 79.9 kg     Physical Exam: Blood pressure 136/61, pulse 92, temperature 98.6 F (37 C), temperature source Esophageal, resp. rate 11, height 5' 2.99" (1.6 m), weight 79.9 kg, SpO2 100 %. Body mass index is 31.21 kg/m. General: Well developed, well nourished, in no acute distress. Head eyes ears nose throat: Normocephalic, atraumatic, sclera non-icteric, no xanthomas, nares are without discharge. No apparent thyromegaly and/or mass  Lungs: Normal respiratory effort.  Few wheezes, no rales, some rhonchi.  Heart: RRR with normal S1 S2. no murmur gallop, no rub, PMI is normal size and placement, carotid upstroke normal without bruit, jugular venous pressure is normal Abdomen: Soft, non-tender, non-distended with normoactive bowel sounds. No hepatomegaly. No rebound/guarding. No obvious abdominal masses. Abdominal aorta is normal size without bruit Extremities: No edema. no cyanosis, no clubbing, no ulcers  Peripheral : 2+ bilateral upper extremity pulses, 2+ bilateral femoral pulses, 2+ bilateral dorsal pedal pulse Neuro: Alert and oriented. No facial asymmetry. No focal deficit. Moves all extremities spontaneously. Musculoskeletal: Normal muscle tone without kyphosis Psych:  Responds to questions appropriately with a normal affect.    Assessment: 60 year old female with acute diabetic ketoacidosis acute kidney injury and respiratory failure with a supraventricular tachycardia consistent with AV node reentrant tachycardia now in normal sinus rhythm without evidence of acute coronary syndrome or congestive heart failure  Plan: 1.  Continue supportive care for acute kidney injury and diabetic ketoacidosis and hypotension as well as respiratory failure with oxygen supplementation without restriction 2.  Continuation of pressure support as necessary with  Levophed 3.  No further cardiac intervention at this time due to patient now in normal sinus rhythm without evidence of congestive heart failure or myocardial infarction 4.  Okay for treatment of possible recurrence of supraventricular tachycardia and/or AV node reentrant tachycardia with diltiazem when patient has improved as per above 5.  Further diagnostic testing and treatment options as per above  Signed, Corey Skains M.D. [facility] 10/08/2020, 5:24 PM

## 2020-10-08 NOTE — Progress Notes (Addendum)
Pt admitted to ICU overnight, near 0200. Upon arrival, pt heavy breathing/kussmaul breaths. Per ED, pt received 2L LR prior to ICU admission.   BG "HIGH" upon ICU admission. Pt tachy HR 180s-190s, Hypertensive 200s/100s. PRN 20mg  Labetolol given. NP and Steward Drone NP called to bedside. Pt subsequently became hypotensive. Pt given 3 amps Bicarb. 2L of NS bolus administered.   Pt intubated (Etomidate, Fent, and Roc). OG tube placed @ 58. Left IJ triple lumen placed. Xray confirmed placement. Pt on Prop, Fent, Bicarb in D5W (Switched over after BG < 250), Levo, and Insulin gtt's. 2x 2mg  Versed given during lining of pt.    Following intubation, pt given 2 more amps of Bicarb (Total of 5 ams Bicarb given in ICU). Another 1 L LR bolus infused (5 L fluid infused in total between ED and ICU).   Bair hugger placed d/t temp < 36 upon arrival. Foley placed, putting out clear yellow urine.   BG coming down appropriately, insulin gtt titrated per endotool protocol.   Current vent settings:  PRVC 30% Peep 5 RR 20 TV 450

## 2020-10-08 NOTE — Progress Notes (Signed)
Made Dr. Belia Heman aware that patient is following commands and has been in spontaneous mode on ventilator.  MD gave order to extubate. RN called Diana,RT.

## 2020-10-08 NOTE — Consult Note (Addendum)
NAME:  Pamela Lane, MRN:  [id number], DOB:  23-Nov-1960, LOS: 1 ADMISSION DATE:  10/07/2020, CONSULTATION DATE:  10/08/2020 REFERRING MD:  Sharion Settler, NP, CHIEF COMPLAINT:  Chest pain  Brief Pt Description  60 year old female admitted 10/07/2020 with severe DKA, hypertensive urgency, and acute kidney injury.  Upon arrival to the stepdown unit she exhibited acute respiratory distress with Kussmaul's respirations due to metabolic derangements requiring emergent intubation and mechanical ventilation, along with Bicarb drip.  History of Present Illness:  Pamela Lane is a 60 y.o. Female with a past medical history as listed below, who presented to [facility] ED on 10/07/20 due to acute onset of chest pain with palpitations, headache, and recurrent nausea and vomiting over the last 5 to 6 days.  She also endorsed polyuria and polydipsia.  She denied fever, chills, hematemesis, abdominal pain, melena, or hematochezia.  ED Course:  Upon presentation to the ER, heart rate was 192 and pulse oximetry was 100% on 100% nonrebreather and blood pressure initially normal was up to 179/102.  Labs revealed venous blood gas with pH of 6.92 and HCO3 6.4 and BMP revealed showed hyponatremia 129 potassium was 5.1 with a chloride of 99.  Glucose was 642 and BUN was 19 with creatinine of 1.77 and LFTs showed total bili of 1.5.  CBC showed leukocytosis of 18.3 with neutrophilia and hemoconcentration.  BNP was 88.9 and high-sensitivity troponin I was 11.  EKG  showed supraventricular cardia with a rate of 195 with right axis deviation with T wave inversion laterally.  After IV adenosine 12 mg IV twice a repeat EKG showed sinus tachycardia with a rate of 124.  Imaging:Portable chest x-ray showed no acute cardiopulmonary disease.  The patient was given 30 mg IM Toradol and 1 L bolus of IV normal saline and about 1.6 L of IV lactated Ringer followed by 125 mL/h in addition to IV adenosine mentioned above.  She was to  be admitted to a stepdown unit bed for further evaluation and management.  Hospital Course: Upon arrival to [facility] she was exhibiting acute respiratory distress and Kussmaul's respirations.  She reported "I'm getting tired of breathing."  She is also more lethargic and altered than previously in the ED.  Given her work of breathing, worsening mental status, and concern for inability to protect her airway, decision was made to proceed with intubation and mechanical ventilation.  A left IJ CVC is placed due to lack of IV access, vasopressors, and need for frequent lab draws.  Post intubation ABG with severe metabolic acidosis (pH 3.76/EGB1 25/pO2 199/ Bicarb 8.9).  She is to be given 2 additional amps of Bicarb and to be placed on Bicarb drip.  PCCM is consulted for further workup and treatment of Acute Respiratory Distress in the setting of severe metabolic derangements due to DKA, requiring emergent intubation and mechanical ventilation, along with Bicarb drip.    Pertinent  Medical History  Diabetes mellitus Asthma Hypertension C. difficile colitis  Significant Hospital Events: Including procedures, antibiotic start and stop dates in addition to other pertinent events   10/07/2020: Presented to ED, to be admitted to stepdown unit for severe DKA 10/08/2020: Upon admission to stepdown, patient with severe respiratory distress due to metabolic derangements, required emergent intubation and central line placement. Bicarb gtt started.   Cultures:  10/07/2020: SARS-CoV-2 PCR>> negative 10/07/2020: Influenza PCR>> negative 10/08/2020: MRSA PCR>> negative  Antimicrobials:  N/A  Interim History / Subjective:  -Upon arrival to stepdown unit from the emergency department,  patient noted to have acute respiratory distress -Patient reported "I am getting tired of breathing", worsening mental status as reported in the ED -Proceeded with emergent intubation and mechanical ventilation -Critically  ill   Objective   Blood pressure (!) 111/57, pulse 67, temperature (!) 94.5 F (34.7 C), temperature source Axillary, resp. rate 20, height '5\' 3"'  (1.6 m), weight 79.9 kg, SpO2 99 %.       No intake or output data in the 24 hours ending 10/08/20 0320 Filed Weights   10/08/20 0230  Weight: 79.9 kg    Examination: General: Critically ill-appearing female, laying in bed, on nasal cannula, with acute respiratory distress HENT: Atraumatic, normocephalic, neck supple, no JVD Lungs: Clear diminished auscultation bilaterally, no wheezing or rales noted, tachypneic, Kussmaul's respirations, increased work of breathing and accessory muscle use Cardiovascular: Tachycardia, regular rhythm, S1-S2, no murmurs, rubs, gallops, 2+ pulses Abdomen: Soft, nontender, nondistended, no guarding or rebound tenderness, bowel sounds positive x4 Extremities: Normal bulk and tone, no deformities, no edema Neuro: Lethargic, arouses to voice, oriented to self, follows commands GU: Deferred  Labs/imaging that I havepersonally reviewed  (right click and "Reselect all SmartList Selections" daily)  VBG: pH 6.92, PCO2 31, PO2 less than 31, bicarb 6.4, Sodium 129, bicarb less than 7, glucose 642, BUN 19, creatinine 1.77, WBC 18.3 with neutrophilia, high-sensitivity troponin 11 Post intubation ABG: pH 7.16, PCO2 25, PO2 199, bicarb 8.9 Chest x-ray 3/30>>Endotracheal tube is seen 2.8 cm above the carina. Nasogastric tube extends into the upper abdomen. Left internal jugular central venous catheter tip noted within the superior vena cava. Pulmonary insufflation has decreased. Lungs are clear. No pneumothorax or pleural effusion. Cardiac size within normal limits. Pulmonary vascularity is normal.  Resolved Hospital Problem list     Assessment & Plan:   Acute respiratory distress secondary to severe metabolic derangements PMHx of Asthma -Full vent support -Wean FiO2 and PEEP as tolerated to maintain O2 sats  greater than 92% -Follow intermittent chest x-ray and ABG -Implement VAP bundle -Spontaneous breathing trials when respiratory parameters met, metabolic derangements corrected, and mental status permits -As needed bronchodilators   Shock, suspect hypovolemic Paroxysmal supraventricular tachycardia with associated chest pain in the ED>> resolved -Continuous cardiac monitoring -Maintain MAP greater than 65 -IV fluid resuscitation -Vasopressors as needed to maintain MAP goal -Patient received IV adenosine in the ED and converted to sinus tachycardia -Cardizem on hold for now given Hypotension -Trend troponin>> negative x2 (11>>16) -Cardiology consulted, appreciate input   Severe DKA -Follow DKA protocol -Aggressive IV fluids -Insulin drip -Follow BMP every 4 hours and beta hydroxybutyric acid every 8 hours -Bicarb drip -Can convert to SSI and Long acting insulin once Anion Gap closed, and Serum Bicarb >20 -Check hemoglobin A1c -Consult diabetes coordinator, appreciate input -Urine drug screen is positive for cocaine   Leukocytosis, no obvious source of infection -Monitor fever curve -Trend WBCs -Check procalcitonin -Initial chest x-ray negative for pneumonia, urinalysis is negative for UTI -Will hold off on empiric antibiotics for now while awaiting procalcitonin   Acute kidney injury Anion gap metabolic acidosis in the setting of DKA Hyponatremia (suspect pseudohyponatremia due to severe hyperglycemia) -Monitor I&O's / urinary output -Follow BMP -Ensure adequate renal perfusion -Avoid nephrotoxic agents as able -Replace electrolytes as indicated -IV fluids -Bicarb drip   Acute metabolic encephalopathy in setting of severe DKA Sedation needs in the setting of mechanical ventilation -Maintain a RASS goal of 0 to -1 -Fentanyl and propofol drips as needed -Avoid sedating meds as  able -Daily wake-up assessment -Provide supportive care -Correct metabolic  derangements   Best practice (right click and "Reselect all SmartList Selections" daily)  Diet:  NPO Pain/Anxiety/Delirium protocol (if indicated): Yes (RASS goal -1) VAP protocol (if indicated): Yes DVT prophylaxis: LMWH GI prophylaxis: H2B Glucose control:  Insulin gtt Central venous access:  Yes, and it is still needed Arterial line:  N/A Foley:  Yes, and it is still needed Mobility:  bed rest  PT consulted: N/A Last date of multidisciplinary goals of care discussion [N/A] Code Status:  full code Disposition: ICU  Labs   CBC: Recent Labs  Lab 10/07/20 2219 10/08/20 0130  WBC 18.3* 22.2*  NEUTROABS 15.6*  --   HGB 15.2* 15.7*  HCT 47.1* 48.2*  MCV 96.3 95.6  PLT 376 413*    Basic Metabolic Panel: Recent Labs  Lab 10/07/20 2219  NA 129*  K 5.1  CL 99  CO2 <7*  GLUCOSE 642*  BUN 19  CREATININE 1.77*  CALCIUM 9.0   GFR: Estimated Creatinine Clearance: 34.3 mL/min (A) (by C-G formula based on SCr of 1.77 mg/dL (H)). Recent Labs  Lab 10/07/20 2219 10/08/20 0130  WBC 18.3* 22.2*    Liver Function Tests: Recent Labs  Lab 10/07/20 2219  AST 14*  ALT 13  ALKPHOS 98  BILITOT 1.5*  PROT 7.9  ALBUMIN 4.2   Recent Labs  Lab 10/07/20 2219  LIPASE 33   No results for input(s): AMMONIA in the last 168 hours.  ABG    Component Value Date/Time   PHART 6.988 (LL) 03/13/2020 1645   PCO2ART 12.2 (LL) 03/13/2020 1645   PO2ART 117 (H) 03/13/2020 1645   HCO3 6.4 (L) 10/07/2020 2219   TCO2 15 01/12/2016 2315   ACIDBASEDEF 25.7 (H) 10/07/2020 2219   O2SAT 21.3 10/07/2020 2219     Coagulation Profile: No results for input(s): INR, PROTIME in the last 168 hours.  Cardiac Enzymes: No results for input(s): CKTOTAL, CKMB, CKMBINDEX, TROPONINI in the last 168 hours.  HbA1C: Hgb A1c MFr Bld  Date/Time Value Ref Range Status  08/27/2020 05:30 PM 13.7 (H) 4.8 - 5.6 % Final    Comment:    (NOTE) Pre diabetes:          5.7%-6.4%  Diabetes:               >6.4%  Glycemic control for   <7.0% adults with diabetes   03/14/2020 12:05 AM 12.1 (H) 4.8 - 5.6 % Final    Comment:    (NOTE) Pre diabetes:          5.7%-6.4%  Diabetes:              >6.4%  Glycemic control for   <7.0% adults with diabetes     CBG: Recent Labs  Lab 10/07/20 2233 10/08/20 0120  GLUCAP 540* 580*    Review of Systems:   Unable to assess due to acute respiratory distress, altered mental status, and critical illness  Past Medical History:  She,  has a past medical history of Asthma, Clostridium difficile colitis, Diabetes mellitus without complication ([place]), Hypertension, and Mental disorder.   Surgical History:   Past Surgical History:  Procedure Laterality Date  . ABDOMINAL HYSTERECTOMY       Social History:   reports that she has been smoking cigarettes. She has been smoking about 0.25 packs per day. She has never used smokeless tobacco. She reports current alcohol use of about 40.0 standard drinks of alcohol per week.  She reports current drug use. Drugs: Marijuana and Cocaine.   Family History:  Her family history includes Hypertension in her mother.   Allergies No Known Allergies   Home Medications  Prior to Admission medications   Medication Sig Start Date End Date Taking? Authorizing Provider  albuterol (PROVENTIL HFA;VENTOLIN HFA) 108 (90 Base) MCG/ACT inhaler Inhale 2 puffs into the lungs every 6 (six) hours as needed for wheezing or shortness of breath. 02/18/18  Yes Dustin Flock, MD  aspirin EC 81 MG tablet Take 81 mg by mouth daily.   Yes [provider]  atorvastatin (LIPITOR) 40 MG tablet Take 1 tablet (40 mg total) by mouth at bedtime. 08/29/20 11/27/20 Yes Hall, Carole N, DO  buPROPion (WELLBUTRIN SR) 150 MG 12 hr tablet Take 150 mg by mouth 2 (two) times daily. 10/01/20  Yes [provider]  diltiazem (CARDIZEM CD) 120 MG 24 hr capsule Take 1 capsule (120 mg total) by mouth daily. 08/29/20 09/28/20 Yes Kayleen Memos,  DO  folic acid (FOLVITE) 1 MG tablet Take 1 tablet (1 mg total) by mouth daily. 03/08/18  Yes Gouru, Illene Silver, MD  gabapentin (NEURONTIN) 100 MG capsule Take 1 capsule (100 mg total) by mouth 2 (two) times daily. 08/29/20 11/27/20 Yes Hall, Carole N, DO  insulin glargine (LANTUS) 100 UNIT/ML Solostar Pen Inject 50 Units into the skin daily. 08/29/20 11/27/20 Yes Hall, Carole N, DO  lisinopril (ZESTRIL) 10 MG tablet Take 10 mg by mouth daily. 10/01/20  Yes [provider]  metFORMIN (GLUCOPHAGE-XR) 500 MG 24 hr tablet Take 2 tablets (1,000 mg total) by mouth daily with breakfast. 08/29/20 11/27/20 Yes Kayleen Memos, DO  Multiple Vitamin (MULTIVITAMIN WITH MINERALS) TABS tablet Take 1 tablet by mouth daily. 03/08/18  Yes Gouru, Aruna, MD  NOVOLOG FLEXPEN 100 UNIT/ML FlexPen Inject 10 Units into the skin 3 (three) times daily with meals. 08/29/20 11/27/20 Yes Kayleen Memos, DO  vitamin B-12 (CYANOCOBALAMIN) 500 MCG tablet Take 500 mcg by mouth daily.   Yes [provider]  buPROPion (ZYBAN) 150 MG 12 hr tablet Take 150 mg by mouth 2 (two) times daily. Patient not taking: Reported on 10/08/2020 05/21/20   [provider]  cetirizine (ZYRTEC) 10 MG tablet Take 10 mg by mouth daily. Patient not taking: Reported on 10/08/2020 02/09/20   [provider]  thiamine 100 MG tablet Take 1 tablet (100 mg total) by mouth daily. Patient not taking: Reported on 10/08/2020 03/08/18   Nicholes Mango, MD     Darel Hong, AGACNP-BC [facility] Pager: [phone]  PCCM ATTENDING ATTESTATION:  I have evaluated patient with the APP, I personally  reviewed database in its entirety and discussed care plan in detail. In addition, this patient was discussed on multidisciplinary rounds.   I agree with assessment and plan.  Severe resp failure from severe acdisosis severe DKA  CBC    Component Value Date/Time   WBC 22.2 (H) 10/08/2020 0130   RBC 5.04 10/08/2020  0130   HGB 15.7 (H) 10/08/2020 0130   HCT 48.2 (H) 10/08/2020 0130   PLT 413 (H) 10/08/2020 0130   MCV 95.6 10/08/2020 0130   MCH 31.2 10/08/2020 0130   MCHC 32.6 10/08/2020 0130   RDW 13.4 10/08/2020 0130   LYMPHSABS 1.2 10/07/2020 2219   MONOABS 1.2 (H) 10/07/2020 2219   EOSABS 0.0 10/07/2020 2219   BASOSABS 0.1 10/07/2020 2219   BMP Latest Ref Rng & Units 10/08/2020 10/07/2020 08/29/2020  Glucose 70 - 99 mg/dL 376(H) 642(HH) 98  BUN 6 - 20 mg/dL 23(H) 19 11  Creatinine 0.44 - 1.00 mg/dL 1.69(H) 1.77(H) 0.81  Sodium 135 - 145 mmol/L 138 129(L) 137  Potassium 3.5 - 5.1 mmol/L 3.9 5.1 4.5  Chloride 98 - 111 mmol/L 111 99 103  CO2 22 - 32 mmol/L 12(L) <7(L) 24  Calcium 8.9 - 10.3 mg/dL 8.1(L) 9.0 8.9     Continue bicarb Vent support Pressors as needed IVF's bicarb infusion   Critical Care Time devoted to patient care services described in this note is 65 minutes.   Overall, patient is critically ill, prognosis is guarded.  Patient with Multiorgan failure and at high risk for cardiac arrest and death.    Corrin Parker, M.D.  [facility]  Medical Director [place] Director [facility] Cardio-Pulmonary Department

## 2020-10-08 NOTE — Progress Notes (Signed)
Patient extubated to 3L nasal cannula. No distress noted. Alert to self. Sister at bedside. Continuing to monitor.

## 2020-10-08 NOTE — Progress Notes (Signed)
Spoke with Dr. Belia Heman regarding transitioning off insulin drip and made MD aware that patient remains drowsy so she can not pass yale swallow screen at this time but that patient complains of being thirsty. Remains on 3 mcg/min of levophed. MD gave order for Lantus 25 units subq daily to start now, 1 L LR bolus and to give ice chips.

## 2020-10-08 NOTE — Progress Notes (Signed)
PHARMACY CONSULT NOTE - FOLLOW UP  Pharmacy Consult for Electrolyte Monitoring and Replacement   Recent Labs: Potassium (mmol/L)  Date Value  10/08/2020 3.5   Magnesium (mg/dL)  Date Value  01/09/1600 1.8   Calcium (mg/dL)  Date Value  09/32/3557 8.1 (L)   Albumin (g/dL)  Date Value  32/20/2542 4.2   Phosphorus (mg/dL)  Date Value  70/62/3762 4.3   Sodium (mmol/L)  Date Value  10/08/2020 140     Assessment: 60 yo F presented with DKA, HTN urgency, and AKI. PMH includes diabetes, asthma, and HTN. Pharmacy consulted to monitor and replace electrolytes as needed.   Myxredlin insulin infusion, D5 @ 125 mL/hr  Goal of Therapy:  Electrolytes WNL  Plan:  --Mg 1.8 - ordered Mg 2g IV x 1 dose  --K 3.5 - ordered KCl 10 mEq IV x 4 doses since on Myredlin infusion --All other electrolytes WNL, monitor with AM labs  Reatha Armour, PharmD Pharmacy Resident  10/08/2020 4:01 PM

## 2020-10-08 NOTE — Progress Notes (Signed)
Inpatient Diabetes Program Recommendations  AACE/ADA: New Consensus Statement on Inpatient Glycemic Control (2015)  Target Ranges:  Prepandial:   less than 140 mg/dL      Peak postprandial:   less than 180 mg/dL (1-2 hours)      Critically ill patients:  140 - 180 mg/dL   Lab Results  Component Value Date   GLUCAP 219 (H) 10/08/2020   HGBA1C 13.7 (H) 08/27/2020    Review of Glycemic Control Results for Pamela Lane, Pamela Lane (MRN [id number]) as of 10/08/2020 09:50  Ref. Range 10/08/2020 04:53 10/08/2020 05:44 10/08/2020 06:38 10/08/2020 07:36 10/08/2020 09:13  Glucose-Capillary Latest Ref Range: 70 - 99 mg/dL 030 (H) 092 (H) 330 (H) 188 (H) 219 (H)   Diabetes history: DM2 Outpatient Diabetes medications: Lantus 50 units qd + Novolog 10 units tid + Metformin 1 gm qd Current orders for Inpatient glycemic control: Lantus 50 units when  Inpatient Diabetes Program Recommendations:   When patient meets criteria for transition to [place] insulin, please consider: -Give basal insulin 2 hrs prior to discontinue IV insulin drip and cover CBG @ time of IV drip discontinued -Add Novolog 0-6 units q 4 hrs. While NPO  Will follow throughout hospitalization.  Thank you, Billy Fischer. Soma Bachand, RN, MSN, CDE  Diabetes Coordinator Inpatient Glycemic Control Team Team Pager [phone] (8am-5pm) 10/08/2020 10:16 AM

## 2020-10-08 NOTE — Progress Notes (Signed)
Patient extubated to nasal cannula per Dr. Magdalene Molly. Patient in no distress at this time. Patient on 3L nasal cannula, oxgen saturation 98%.

## 2020-10-09 ENCOUNTER — Encounter: Payer: Self-pay | Admitting: Family Medicine

## 2020-10-09 DIAGNOSIS — E1110 Type 2 diabetes mellitus with ketoacidosis without coma: Secondary | ICD-10-CM | POA: Diagnosis not present

## 2020-10-09 DIAGNOSIS — R079 Chest pain, unspecified: Secondary | ICD-10-CM

## 2020-10-09 LAB — RENAL FUNCTION PANEL
Albumin: 2.6 g/dL — ABNORMAL LOW (ref 3.5–5.0)
Anion gap: 6 (ref 5–15)
BUN: 12 mg/dL (ref 6–20)
CO2: 25 mmol/L (ref 22–32)
Calcium: 8 mg/dL — ABNORMAL LOW (ref 8.9–10.3)
Chloride: 108 mmol/L (ref 98–111)
Creatinine, Ser: 0.76 mg/dL (ref 0.44–1.00)
GFR, Estimated: >60 mL/min (ref >60)
Glucose, Bld: 258 mg/dL — ABNORMAL HIGH (ref 70–99)
Phosphorus: 1.6 mg/dL — ABNORMAL LOW (ref 2.5–4.6)
Potassium: 3.2 mmol/L — ABNORMAL LOW (ref 3.5–5.1)
Sodium: 139 mmol/L (ref 135–145)

## 2020-10-09 LAB — PROCALCITONIN: Procalcitonin: <0.1 ng/mL

## 2020-10-09 LAB — GLUCOSE, CAPILLARY
Glucose-Capillary: 217 mg/dL — ABNORMAL HIGH (ref 70–99)
Glucose-Capillary: 266 mg/dL — ABNORMAL HIGH (ref 70–99)
Glucose-Capillary: 228 mg/dL — ABNORMAL HIGH (ref 70–99)
Glucose-Capillary: 304 mg/dL — ABNORMAL HIGH (ref 70–99)
Glucose-Capillary: 175 mg/dL — ABNORMAL HIGH (ref 70–99)

## 2020-10-09 LAB — MAGNESIUM: Magnesium: 1.9 mg/dL (ref 1.7–2.4)

## 2020-10-09 MED ORDER — INSULIN GLARGINE 100 UNIT/ML ~~LOC~~ SOLN
30.0000 [IU] | Freq: Every day | SUBCUTANEOUS | Status: DC
  Administered 2020-10-10 – 2020-10-11 (×2): 30 [IU] via SUBCUTANEOUS
  Filled 2020-10-09 (×2): qty 0.3

## 2020-10-09 MED ORDER — INSULIN ASPART 100 UNIT/ML ~~LOC~~ SOLN
0.0000 [IU] | Freq: Three times a day (TID) | SUBCUTANEOUS | Status: DC
  Administered 2020-10-09: 12:00:00 5 [IU] via SUBCUTANEOUS
  Administered 2020-10-09: 11 [IU] via SUBCUTANEOUS
  Administered 2020-10-10: 18:00:00 5 [IU] via SUBCUTANEOUS
  Administered 2020-10-10: 09:00:00 8 [IU] via SUBCUTANEOUS
  Administered 2020-10-10: 14:00:00 2 [IU] via SUBCUTANEOUS
  Administered 2020-10-11: 09:00:00 8 [IU] via SUBCUTANEOUS
  Filled 2020-10-09 (×6): qty 1

## 2020-10-09 MED ORDER — LIVING WELL WITH DIABETES BOOK
Freq: Once | Status: AC
  Filled 2020-10-09: qty 1

## 2020-10-09 MED ORDER — K PHOS MONO-SOD PHOS DI & MONO 155-852-130 MG PO TABS
500.0000 mg | ORAL_TABLET | ORAL | Status: AC
Start: 2020-10-09 — End: 2020-10-10
  Administered 2020-10-09 – 2020-10-10 (×4): 500 mg via ORAL
  Filled 2020-10-09 (×4): qty 2

## 2020-10-09 MED ORDER — POTASSIUM CHLORIDE 10 MEQ/100ML IV SOLN
10.0000 meq | INTRAVENOUS | Status: AC
Start: 2020-10-09 — End: 2020-10-09
  Administered 2020-10-09 (×4): 10 meq via INTRAVENOUS
  Filled 2020-10-09 (×5): qty 100

## 2020-10-09 MED ORDER — DILTIAZEM HCL ER COATED BEADS 240 MG PO CP24
240.0000 mg | ORAL_CAPSULE | Freq: Every day | ORAL | Status: DC
  Administered 2020-10-09 – 2020-10-11 (×3): 240 mg via ORAL
  Filled 2020-10-09 (×3): qty 1

## 2020-10-09 MED ORDER — INSULIN ASPART 100 UNIT/ML ~~LOC~~ SOLN
3.0000 [IU] | Freq: Three times a day (TID) | SUBCUTANEOUS | Status: DC
  Administered 2020-10-09 – 2020-10-11 (×5): 3 [IU] via SUBCUTANEOUS
  Filled 2020-10-09 (×5): qty 1

## 2020-10-09 MED ORDER — INSULIN ASPART 100 UNIT/ML ~~LOC~~ SOLN
0.0000 [IU] | Freq: Every day | SUBCUTANEOUS | Status: DC
  Administered 2020-10-10: 21:00:00 4 [IU] via SUBCUTANEOUS
  Filled 2020-10-09: qty 1

## 2020-10-09 MED ORDER — POTASSIUM CHLORIDE CRYS ER 20 MEQ PO TBCR
20.0000 meq | EXTENDED_RELEASE_TABLET | Freq: Once | ORAL | Status: AC
  Administered 2020-10-09: 20 meq via ORAL
  Filled 2020-10-09: qty 1

## 2020-10-09 MED ORDER — POTASSIUM CHLORIDE 20 MEQ PO PACK: 20.0000 meq | PACK | Freq: Once | ORAL | Status: DC

## 2020-10-09 NOTE — Progress Notes (Signed)
NAME:  Pamela Lane, MRN:  [id number], DOB:  1961-05-16, LOS: 2 ADMISSION DATE:  10/07/2020, CONSULTATION DATE:  10/08/2020 REFERRING MD:  Manuela Schwartz, NP, CHIEF COMPLAINT:  Chest pain  Brief Pt Description  60 year old female admitted 10/07/2020 with severe DKA, hypertensive urgency, and acute kidney injury.  Upon arrival to the stepdown unit she exhibited acute respiratory distress with Kussmaul's respirations due to metabolic derangements requiring emergent intubation and mechanical ventilation, along with Bicarb drip.  History of Present Illness:  [name] is a 60 y.o. Female with a past medical history as listed below, who presented to [facility] ED on 10/07/20 due to acute onset of chest pain with palpitations, headache, and recurrent nausea and vomiting over the last 5 to 6 days.  She also endorsed polyuria and polydipsia.  She denied fever, chills, hematemesis, abdominal pain, melena, or hematochezia.  ED Course:  Upon presentation to the ER, heart rate was 192 and pulse oximetry was 100% on 100% nonrebreather and blood pressure initially normal was up to 179/102.  Labs revealed venous blood gas with pH of 6.92 and HCO3 6.4 and BMP revealed showed hyponatremia 129 potassium was 5.1 with a chloride of 99.  Glucose was 642 and BUN was 19 with creatinine of 1.77 and LFTs showed total bili of 1.5.  CBC showed leukocytosis of 18.3 with neutrophilia and hemoconcentration.  BNP was 88.9 and high-sensitivity troponin I was 11.  EKG  showed supraventricular cardia with a rate of 195 with right axis deviation with T wave inversion laterally.  After IV adenosine 12 mg IV twice a repeat EKG showed sinus tachycardia with a rate of 124.  Imaging:Portable chest x-ray showed no acute cardiopulmonary disease.  The patient was given 30 mg IM Toradol and 1 L bolus of IV normal saline and about 1.6 L of IV lactated Ringer followed by 125 mL/h in addition to IV adenosine mentioned above.  She was to  be admitted to a stepdown unit bed for further evaluation and management.  Hospital Course: Upon arrival to [facility] she was exhibiting acute respiratory distress and Kussmaul's respirations.  She reported "I'm getting tired of breathing."  She is also more lethargic and altered than previously in the ED.  Given her work of breathing, worsening mental status, and concern for inability to protect her airway, decision was made to proceed with intubation and mechanical ventilation.  A left IJ CVC is placed due to lack of IV access, vasopressors, and need for frequent lab draws.  Post intubation ABG with severe metabolic acidosis (pH 7.16/pCO2 25/pO2 199/ Bicarb 8.9).  She is to be given 2 additional amps of Bicarb and to be placed on Bicarb drip.  PCCM is consulted for further workup and treatment of Acute Respiratory Distress in the setting of severe metabolic derangements due to DKA, requiring emergent intubation and mechanical ventilation, along with Bicarb drip.    Pertinent  Medical History  Diabetes mellitus Asthma Hypertension C. difficile colitis  Significant Hospital Events: Including procedures, antibiotic start and stop dates in addition to other pertinent events   10/07/2020: Presented to ED, to be admitted to stepdown unit for severe DKA 10/08/2020: Upon admission to stepdown, patient with severe respiratory distress due to metabolic derangements, required emergent intubation and central line placement. Bicarb gtt started. 10/08/2020: Extubated later in the afternoon, DKA resolved, converted off insulin gtt 10/09/2020: Pt awake and alert, on room air, VSS, off Vasopressors.  Transfer out of ICU today, plan to remove central line and  foley  Cultures:  10/07/2020: SARS-CoV-2 PCR>> negative 10/07/2020: Influenza PCR>> negative 10/08/2020: MRSA PCR>> negative  Antimicrobials:  N/A  Interim History / Subjective:  -Extubated yesterday and DKA resolved, converted off insulin gtt -Pt is  awake and alert, A&O x4 -Afebrile, Hemodynamically stable, on room air, off Vasopressors -Denies all complaints -Receiving Potassium replacement IV   Objective   Blood pressure 123/82, pulse 93, temperature 99.1 F (37.3 C), temperature source Oral, resp. rate 12, height 5' 2.99" (1.6 m), weight 79.9 kg, SpO2 97 %.    Vent Mode: PSV FiO2 (%):  [30 %] 30 % Set Rate:  [20 bmp] 20 bmp Vt Set:  [450 mL-454 mL] 454 mL PEEP:  [5 cmH20] 5 cmH20 Pressure Support:  [5 cmH20] 5 cmH20   Intake/Output Summary (Last 24 hours) at 10/09/2020 0846 Last data filed at 10/09/2020 0600 Gross per 24 hour  Intake 5153.88 ml  Output 1325 ml  Net 3828.88 ml   Filed Weights   10/08/20 0230  Weight: 79.9 kg    Examination: General: Acutely ill appearing female, sitting in bed, on room air, in NAD HENT: Atraumatic, normocephalic, neck supple, no JVD Lungs: Clear to auscultation bilaterally, no wheezing or rales, even, nonlabored Cardiovascular: Tachycardia, regular rhythm, s1s2, no M/R/G, 2+ distal pulses Abdomen: soft, nontender, nondistended, no guarding or rebound tenderness, BS + x4 Extremities: Normal bulk and tone, no deformities, no edema Neuro: Awake, A&O x4, follows commands, no focal deficits, speech clear, pupils PERRL GU: Foley catheter in place draining yellow urine Skin: Warm and dry.  No obvious rashes, lesions, or ulcerations  Labs/imaging that I havepersonally reviewed  (right click and "Reselect all SmartList Selections" daily)  K 3.2, Glucose 258, BUN 12, Cr. 0.76, Procalcitonin <0.10 Chest x-ray 3/30>>Endotracheal tube is seen 2.8 cm above the carina. Nasogastric tube extends into the upper abdomen. Left internal jugular central venous catheter tip noted within the superior vena cava. Pulmonary insufflation has decreased. Lungs are clear. No pneumothorax or pleural effusion. Cardiac size within normal limits. Pulmonary vascularity is normal.  Resolved Hospital Problem list    DKA AKI Acute Metabolic Encephalopathy Acute Respiratory Distress Hypovolemic shock  Assessment & Plan:   Acute respiratory distress secondary to severe metabolic derangements>>resolved PMHx of Asthma -Extubated 10/09/19 -Supplemental O2 as needed to maintain O2 sats >92% -Follow intermittent CXR & ABG as needed -Prn Bronchodilators   Shock, suspect hypovolemic>>resolved Paroxysmal supraventricular tachycardia with associated chest pain in the ED>> resolved -Continuous cardiac monitoring -Maintain MAP greater than 65 -Received IV fluid resuscitation -Continue gentle IV fluids (LR @ 50 ml/hr) -Patient received IV adenosine in the ED and converted to sinus tachycardia -Continue PO Cardizem  -Trend troponin>> negative x2 (11>>16) -Cardiology following, appreciate input   Severe DKA>>resolved -CBG's -SSI and Lantus -Follow ICU Hypo/Hyperglycemia protocol -Diabetes coordinator following, appreciate input   Leukocytosis, no obvious source of infection -Monitor fever curve -Trend WBCs -Check procalcitonin>>negative (<0.10) -Initial chest x-ray negative for pneumonia, urinalysis is negative for UTI -Will hold off on empiric antibiotics due to negative procalcitonin   Acute kidney injury>>resolved Anion gap metabolic acidosis in the setting of DKA>>resolved Hyponatremia (suspect pseudohyponatremia due to severe hyperglycemia)>>resolved Hypokalemia -Monitor I&O's / urinary output -Follow BMP -Ensure adequate renal perfusion -Avoid nephrotoxic agents as able -Replace electrolytes as indicated -IV fluids -Pharmacy consulted for assistance in electrolyte replacement   Acute metabolic encephalopathy in setting of severe DKA>>resolved -Provide supportive care -Avoid sedating meds as able -Encourage normal sleep/wake cycle   Best practice (right click  and "Reselect all SmartList Selections" daily)  Diet:  Carb modified Pain/Anxiety/Delirium protocol (if indicated):  N/A VAP protocol (if indicated): N/A DVT prophylaxis: LMWH GI prophylaxis: H2B Glucose control:  SSI & Lantus 25 units Central venous access:  Will plan to remove today Arterial line:  N/A Foley:  Will plan to remove today Mobility:  OOB as tolerated PT consulted: N/A Last date of multidisciplinary goals of care discussion [10/09/2020] Code Status:  full code Disposition: Med-Surg  Labs   CBC: Recent Labs  Lab 10/07/20 2219 10/08/20 0130 10/08/20 0753  WBC 18.3* 22.2* 19.7*  NEUTROABS 15.6*  --   --   HGB 15.2* 15.7* 12.8  HCT 47.1* 48.2* 35.7*  MCV 96.3 95.6 87.9  PLT 376 413* 316    Basic Metabolic Panel: Recent Labs  Lab 10/08/20 0423 10/08/20 0753 10/08/20 0940 10/08/20 1146 10/08/20 1548 10/08/20 2013 10/09/20 0514  NA 138   < > 140 140 140 140 139  K 3.9   < > 3.4* 3.5 2.9* 3.5 3.2*  CL 111   < > 109 109 109 109 108  CO2 12*   < > 22 22 25 25 25   GLUCOSE 376*   < > 206* 181* 185* 169* 258*  BUN 23*   < > 20 19 17 15 12   CREATININE 1.69*   < > 1.17* 0.99 0.95 0.80 0.76  CALCIUM 8.1*   < > 8.3* 8.1* 8.3* 8.1* 8.0*  MG 1.8  --   --   --   --   --  1.9  PHOS 4.3  --   --   --   --   --  1.6*   < > = values in this interval not displayed.   GFR: Estimated Creatinine Clearance: 75.8 mL/min (by C-G formula based on SCr of 0.76 mg/dL). Recent Labs  Lab 10/07/20 2219 10/08/20 0130 10/08/20 0423 10/08/20 0753 10/09/20 0514  PROCALCITON  --   --  0.17  --  <0.10  WBC 18.3* 22.2*  --  19.7*  --     Liver Function Tests: Recent Labs  Lab 10/07/20 2219 10/09/20 0514  AST 14*  --   ALT 13  --   ALKPHOS 98  --   BILITOT 1.5*  --   PROT 7.9  --   ALBUMIN 4.2 2.6*   Recent Labs  Lab 10/07/20 2219  LIPASE 33   No results for input(s): AMMONIA in the last 168 hours.  ABG    Component Value Date/Time   PHART 7.34 (L) 10/08/2020 0805   PCO2ART 40 10/08/2020 0805   PO2ART 78 (L) 10/08/2020 0805   HCO3 21.6 10/08/2020 0805   TCO2 15 01/12/2016  2315   ACIDBASEDEF 3.9 (H) 10/08/2020 0805   O2SAT 94.6 10/08/2020 0805     Coagulation Profile: No results for input(s): INR, PROTIME in the last 168 hours.  Cardiac Enzymes: No results for input(s): CKTOTAL, CKMB, CKMBINDEX, TROPONINI in the last 168 hours.  HbA1C: Hgb A1c MFr Bld  Date/Time Value Ref Range Status  10/08/2020 01:30 AM 13.6 (H) 4.8 - 5.6 % Final    Comment:    (NOTE) Pre diabetes:          5.7%-6.4%  Diabetes:              >6.4%  Glycemic control for   <7.0% adults with diabetes   08/27/2020 05:30 PM 13.7 (H) 4.8 - 5.6 % Final    Comment:    (NOTE)  Pre diabetes:          5.7%-6.4%  Diabetes:              >6.4%  Glycemic control for   <7.0% adults with diabetes     CBG: Recent Labs  Lab 10/08/20 1840 10/08/20 2003 10/08/20 2334 10/09/20 0406 10/09/20 0727  GLUCAP 196* 165* 208* 217* 266*    Review of Systems:   Positives in BOLD: Pt denies all complaints Gen: Denies fever, chills, weight change, fatigue, night sweats HEENT: Denies blurred vision, double vision, hearing loss, tinnitus, sinus congestion, rhinorrhea, sore throat, neck stiffness, dysphagia PULM: Denies shortness of breath, cough, sputum production, hemoptysis, wheezing CV: Denies chest pain, edema, orthopnea, paroxysmal nocturnal dyspnea, palpitations GI: Denies abdominal pain, nausea, vomiting, diarrhea, hematochezia, melena, constipation, change in bowel habits GU: Denies dysuria, hematuria, polyuria, oliguria, urethral discharge Endocrine: Denies hot or cold intolerance, polyuria, polyphagia or appetite change Derm: Denies rash, dry skin, scaling or peeling skin change Heme: Denies easy bruising, bleeding, bleeding gums Neuro: Denies headache, numbness, weakness, slurred speech, loss of memory or consciousness   Past Medical History:  She,  has a past medical history of Asthma, Clostridium difficile colitis, Diabetes mellitus without complication (HCC), Hypertension, and  Mental disorder.   Surgical History:   Past Surgical History:  Procedure Laterality Date  . ABDOMINAL HYSTERECTOMY       Social History:   reports that she has been smoking cigarettes. She has been smoking about 0.25 packs per day. She has never used smokeless tobacco. She reports current alcohol use of about 40.0 standard drinks of alcohol per week. She reports current drug use. Drugs: Marijuana and Cocaine.   Family History:  Her family history includes Hypertension in her mother.   Allergies No Known Allergies   Home Medications  Prior to Admission medications   Medication Sig Start Date End Date Taking? Authorizing Provider  albuterol (PROVENTIL HFA;VENTOLIN HFA) 108 (90 Base) MCG/ACT inhaler Inhale 2 puffs into the lungs every 6 (six) hours as needed for wheezing or shortness of breath. 02/18/18  Yes Auburn Bilberry, MD  aspirin EC 81 MG tablet Take 81 mg by mouth daily.   Yes [provider]  atorvastatin (LIPITOR) 40 MG tablet Take 1 tablet (40 mg total) by mouth at bedtime. 08/29/20 11/27/20 Yes Hall, Carole N, DO  buPROPion (WELLBUTRIN SR) 150 MG 12 hr tablet Take 150 mg by mouth 2 (two) times daily. 10/01/20  Yes [provider]  diltiazem (CARDIZEM CD) 120 MG 24 hr capsule Take 1 capsule (120 mg total) by mouth daily. 08/29/20 09/28/20 Yes Darlin Drop, DO  folic acid (FOLVITE) 1 MG tablet Take 1 tablet (1 mg total) by mouth daily. 03/08/18  Yes Gouru, Deanna Artis, MD  gabapentin (NEURONTIN) 100 MG capsule Take 1 capsule (100 mg total) by mouth 2 (two) times daily. 08/29/20 11/27/20 Yes Hall, Carole N, DO  insulin glargine (LANTUS) 100 UNIT/ML Solostar Pen Inject 50 Units into the skin daily. 08/29/20 11/27/20 Yes Hall, Carole N, DO  lisinopril (ZESTRIL) 10 MG tablet Take 10 mg by mouth daily. 10/01/20  Yes [provider]  metFORMIN (GLUCOPHAGE-XR) 500 MG 24 hr tablet Take 2 tablets (1,000 mg total) by mouth daily with breakfast. 08/29/20 11/27/20 Yes Darlin Drop, DO  Multiple Vitamin (MULTIVITAMIN WITH MINERALS) TABS tablet Take 1 tablet by mouth daily. 03/08/18  Yes Gouru, Aruna, MD  NOVOLOG FLEXPEN 100 UNIT/ML FlexPen Inject 10 Units into the skin 3 (three) times daily  with meals. 08/29/20 11/27/20 Yes Darlin DropHall, Carole N, DO  vitamin B-12 (CYANOCOBALAMIN) 500 MCG tablet Take 500 mcg by mouth daily.   Yes [provider]  buPROPion (ZYBAN) 150 MG 12 hr tablet Take 150 mg by mouth 2 (two) times daily. Patient not taking: Reported on 10/08/2020 05/21/20   [provider]  cetirizine (ZYRTEC) 10 MG tablet Take 10 mg by mouth daily. Patient not taking: Reported on 10/08/2020 02/09/20   [provider]  thiamine 100 MG tablet Take 1 tablet (100 mg total) by mouth daily. Patient not taking: Reported on 10/08/2020 03/08/18   Ramonita LabGouru, Aruna, MD     Harlon DittyJeremiah Ralonda Tartt, AGACNP-BC [facility] Pager: [phone]

## 2020-10-09 NOTE — Progress Notes (Signed)
[facility] Cardiology [facility] Encounter Note  Patient: Pamela Lane / Admit Date: 10/07/2020 / Date of Encounter: 10/09/2020, 8:48 AM   Subjective: Patient significantly improved from yesterday with no evidence of respiratory distress at all at this time.  Blood pressure significantly improved since yesterday with no need for pressure support.  Patient has had no evidence of myocardial infarction despite hypotension and very rapid heart rate of 195 bpm and no evidence of congestive heart failure.  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  Review of Systems: Positive for: Shortness of breath Negative for: Vision change, hearing change, syncope, dizziness, nausea, vomiting,diarrhea, bloody stool, stomach pain, cough, congestion, diaphoresis, urinary frequency, urinary pain,skin lesions, skin rashes Others previously listed  Objective: Telemetry: Sinus tachycardia Physical Exam: Blood pressure 123/82, pulse 93, temperature 99.1 F (37.3 C), temperature source Oral, resp. rate 12, height 5' 2.99" (1.6 m), weight 79.9 kg, SpO2 97 %. Body mass index is 31.21 kg/m. General: Well developed, well nourished, in no acute distress. Head: Normocephalic, atraumatic, sclera non-icteric, no xanthomas, nares are without discharge. Neck: No apparent masses Lungs: Normal respirations with few wheezes, no rhonchi, no rales , no crackles   Heart: Regular rate and rhythm, normal S1 S2, no murmur, no rub, no gallop, PMI is normal size and placement, carotid upstroke normal without bruit, jugular venous pressure normal Abdomen: Soft, non-tender, non-distended with normoactive bowel sounds. No hepatosplenomegaly. Abdominal aorta is normal size without bruit Extremities: Trace edema, no  clubbing, no cyanosis, no ulcers,  Peripheral: 2+ radial, 2+ femoral, 2+ dorsal pedal pulses Neuro: Alert and oriented. Moves all extremities spontaneously. Psych:  Responds to questions appropriately with a normal affect.   Intake/Output Summary (Last 24 hours) at 10/09/2020 0848 Last data filed at 10/09/2020 0600 Gross per 24 hour  Intake 5153.88 ml  Output 1325 ml  Net 3828.88 ml    Inpatient Medications:  . atorvastatin  40 mg Oral QHS  . buPROPion  150 mg Oral BID  . chlorhexidine gluconate (MEDLINE KIT)  15 mL Mouth Rinse BID  . Chlorhexidine Gluconate Cloth  6 each Topical Q0600  . diltiazem  240 mg Oral Daily  . docusate sodium  100 mg Oral BID  . enoxaparin (LOVENOX) injection  40 mg Subcutaneous Q24H  . folic acid  1 mg Oral Daily  . gabapentin  100 mg Oral BID  . insulin aspart  0-6 Units Subcutaneous Q4H  . insulin glargine  25 Units Subcutaneous Daily  . loratadine  10 mg Oral Daily  . mouth rinse  15 mL Mouth Rinse 10 times per day  . multivitamin with minerals  1 tablet Oral Daily  . polyethylene glycol  17 g Oral Daily  . thiamine  100 mg Oral Daily   Infusions:  . sodium chloride Stopped (10/08/20 0450)  . famotidine (PEPCID) IV 20 mg (10/08/20 2206)  . insulin 3.2 mL/hr at 10/08/20 1800  . lactated ringers 50 mL/hr at 10/09/20 0600  . norepinephrine (LEVOPHED) Adult infusion 2 mcg/min (10/08/20 1800)  . potassium chloride 10 mEq (10/09/20 0845)    Labs: Recent Labs    10/08/20 0423 10/08/20 0753 10/08/20 2013 10/09/20 0514  NA 138   < > 140 139  K 3.9   < > 3.5 3.2*  CL 111   < > 109  108  CO2 12*   < > 25 25  GLUCOSE 376*   < > 169* 258*  BUN 23*   < > 15 12  CREATININE 1.69*   < > 0.80 0.76  CALCIUM 8.1*   < > 8.1* 8.0*  MG 1.8  --   --  1.9  PHOS 4.3  --   --  1.6*   < > = values in this interval not displayed.   Recent Labs    10/07/20 2219 10/09/20 0514  AST 14*  --   ALT 13  --   ALKPHOS 98  --   BILITOT 1.5*  --   PROT 7.9  --    ALBUMIN 4.2 2.6*   Recent Labs    10/07/20 2219 10/08/20 0130 10/08/20 0753  WBC 18.3* 22.2* 19.7*  NEUTROABS 15.6*  --   --   HGB 15.2* 15.7* 12.8  HCT 47.1* 48.2* 35.7*  MCV 96.3 95.6 87.9  PLT 376 413* 316   No results for input(s): CKTOTAL, CKMB, TROPONINI in the last 72 hours. Invalid input(s): POCBNP Recent Labs    10/08/20 0130  HGBA1C 13.6*     Weights: [employer]   10/08/20 0230  Weight: 79.9 kg     Radiology/Studies:  DG Abd 1 View  Result Date: 10/08/2020 CLINICAL DATA:  Nasogastric tube placement EXAM: ABDOMEN - 1 VIEW COMPARISON:  None. FINDINGS: Nasogastric tube tip within the expected distal body of the stomach. Visualized abdominal gas pattern is normal. IMPRESSION: Nasogastric tube tip within the distal body of the stomach. Electronically Signed   By: Fidela Salisbury MD   On: 10/08/2020 04:43   DG Chest Port 1 View  Result Date: 10/08/2020 CLINICAL DATA:  Respiratory failure EXAM: PORTABLE CHEST 1 VIEW COMPARISON:  None. FINDINGS: Endotracheal tube is seen 2.8 cm above the carina. Nasogastric tube extends into the upper abdomen. Left internal jugular central venous catheter tip noted within the superior vena cava. Pulmonary insufflation has decreased. Lungs are clear. No pneumothorax or pleural effusion. Cardiac size within normal limits. Pulmonary vascularity is normal. IMPRESSION: Support tubes in appropriate position. In left IJ CVC tip within the superior vena cava.  No pneumothorax. Progressive pulmonary hypoinflation. Electronically Signed   By: Fidela Salisbury MD   On: 10/08/2020 04:43   DG Chest Port 1 View  Result Date: 10/07/2020 CLINICAL DATA:  Generalized chest pain for 3 days, headache EXAM: PORTABLE CHEST 1 VIEW COMPARISON:  03/13/2020 FINDINGS: Single frontal view of the chest demonstrates an unremarkable cardiac silhouette. External defibrillator pads overlie the cardiac silhouette. No airspace disease, effusion, or pneumothorax. No acute  bony abnormalities. IMPRESSION: 1. No acute intrathoracic process. Electronically Signed   By: Randa Ngo M.D.   On: 10/07/2020 23:57     Assessment and Recommendation  60 y.o. female with known hypertension hyperlipidemia diabetes with diabetic ketoacidosis and acute kidney injury having rapid heart rate due to significant illness consistent with AV node reentrant tachycardia now completely resolved without evidence of congestive heart failure or acute coronary syndrome 1.  Continuation of treatment and supportive care of diabetic ketoacidosis hypertension and current respiratory failure now improving 2.  No further cardiac diagnostics necessary at this time due to no evidence of congestive heart failure or myocardial infarction 3.  Continue hypertension control and also heart rate control with reduction of possible episodes of supraventricular tachycardia using diltiazem long-acting 240 mg each day and would continue throughout discharge 4.  Patient may ambulate and has no  other restrictions to other physical activity from the cardiovascular standpoint 5.  Call if further questions or and/or concerns.  Dr. Saralyn Pilar is covering for the weekend  Signed, Serafina Royals M.D. FACC

## 2020-10-09 NOTE — Plan of Care (Signed)

## 2020-10-09 NOTE — Progress Notes (Addendum)
PHARMACY CONSULT NOTE - FOLLOW UP  Pharmacy Consult for Electrolyte Monitoring and Replacement   Recent Labs: Potassium (mmol/L)  Date Value  10/09/2020 3.2 (L)   Magnesium (mg/dL)  Date Value  94/32/0037 1.9   Calcium (mg/dL)  Date Value  94/44/6190 8.0 (L)   Albumin (g/dL)  Date Value  07/02/4113 2.6 (L)   Phosphorus (mg/dL)  Date Value  64/31/4276 1.6 (L)   Sodium (mmol/L)  Date Value  10/09/2020 139     Assessment: 60 yo F presented with DKA, HTN urgency, and AKI. PMH includes diabetes, asthma, and HTN. Pharmacy consulted to monitor and replace electrolytes as needed.   Goal of Therapy:  Electrolytes WNL  Plan:  --K 3.2 - ordered KCl 10 mEq IV x 4 doses and KCl 20 mEq PO x 1  --Phos 1.6 - replace with K Phos Neutral 500 mg PO q4h x 4 doses --All other electrolytes WNL - pharmacy to sign off on monitoring electrolytes  Reatha Armour, PharmD Pharmacy Resident  10/09/2020 7:30 AM

## 2020-10-09 NOTE — Progress Notes (Signed)
Inpatient Diabetes Program Recommendations  AACE/ADA: New Consensus Statement on Inpatient Glycemic Control (2015)  Target Ranges:  Prepandial:   less than 140 mg/dL      Peak postprandial:   less than 180 mg/dL (1-2 hours)      Critically ill patients:  140 - 180 mg/dL   Lab Results  Component Value Date   GLUCAP 266 (H) 10/09/2020   HGBA1C 13.6 (H) 10/08/2020    Review of Glycemic Control  Diabetes history: DM2 Outpatient Diabetes medications: Lantus 50 units qd + Novolog 10 units tid + Metformin 1 gm qd Current orders for Inpatient glycemic control: Lantus 25 units qd + Novolog correction 0-6 units q 4 hrs.  Inpatient Diabetes Program Recommendations:   Noted patient ordered a diet starting this am. If patient eats 50% meals, consider: -Novolog 3 units tid meal coverage -Increase Lantus to 30-35 units daily  Thank you, Darel Hong E. Walt Geathers, RN, MSN, CDE  Diabetes Coordinator Inpatient Glycemic Control Team Team Pager [phone] (8am-5pm) 10/09/2020 9:04 AM

## 2020-10-10 DIAGNOSIS — R0603 Acute respiratory distress: Secondary | ICD-10-CM

## 2020-10-10 DIAGNOSIS — E1110 Type 2 diabetes mellitus with ketoacidosis without coma: Secondary | ICD-10-CM | POA: Diagnosis not present

## 2020-10-10 DIAGNOSIS — E876 Hypokalemia: Secondary | ICD-10-CM

## 2020-10-10 DIAGNOSIS — D72829 Elevated white blood cell count, unspecified: Secondary | ICD-10-CM | POA: Diagnosis not present

## 2020-10-10 DIAGNOSIS — I471 Supraventricular tachycardia: Secondary | ICD-10-CM | POA: Diagnosis not present

## 2020-10-10 LAB — GLUCOSE, CAPILLARY
Glucose-Capillary: 291 mg/dL — ABNORMAL HIGH (ref 70–99)
Glucose-Capillary: 140 mg/dL — ABNORMAL HIGH (ref 70–99)
Glucose-Capillary: 232 mg/dL — ABNORMAL HIGH (ref 70–99)
Glucose-Capillary: 307 mg/dL — ABNORMAL HIGH (ref 70–99)

## 2020-10-10 LAB — CBC
HCT: 31.1 % — ABNORMAL LOW (ref 36.0–46.0)
Hemoglobin: 11 g/dL — ABNORMAL LOW (ref 12.0–15.0)
MCH: 31.4 pg (ref 26.0–34.0)
MCHC: 35.4 g/dL (ref 30.0–36.0)
MCV: 88.9 fL (ref 80.0–100.0)
Platelets: 200 10*3/uL (ref 150–400)
RBC: 3.5 MIL/uL — ABNORMAL LOW (ref 3.87–5.11)
RDW: 12.9 % (ref 11.5–15.5)
WBC: 5.1 10*3/uL (ref 4.0–10.5)
nRBC: 0 % (ref 0.0–0.2)

## 2020-10-10 LAB — BASIC METABOLIC PANEL
Anion gap: 9 (ref 5–15)
BUN: 7 mg/dL (ref 6–20)
CO2: 23 mmol/L (ref 22–32)
Calcium: 7.8 mg/dL — ABNORMAL LOW (ref 8.9–10.3)
Chloride: 104 mmol/L (ref 98–111)
Creatinine, Ser: 0.64 mg/dL (ref 0.44–1.00)
GFR, Estimated: >60 mL/min (ref >60)
Glucose, Bld: 265 mg/dL — ABNORMAL HIGH (ref 70–99)
Potassium: 2.9 mmol/L — ABNORMAL LOW (ref 3.5–5.1)
Sodium: 136 mmol/L (ref 135–145)

## 2020-10-10 LAB — PROCALCITONIN: Procalcitonin: 0.1 ng/mL

## 2020-10-10 MED ORDER — POTASSIUM CHLORIDE CRYS ER 20 MEQ PO TBCR
40.0000 meq | EXTENDED_RELEASE_TABLET | Freq: Two times a day (BID) | ORAL | Status: AC
  Administered 2020-10-10 – 2020-10-11 (×3): 40 meq via ORAL
  Filled 2020-10-10 (×3): qty 2

## 2020-10-10 MED ORDER — SIMETHICONE 80 MG PO CHEW
80.0000 mg | CHEWABLE_TABLET | Freq: Four times a day (QID) | ORAL | Status: DC | PRN
  Administered 2020-10-10: 80 mg via ORAL
  Filled 2020-10-10 (×3): qty 1

## 2020-10-10 MED ORDER — POTASSIUM CHLORIDE 10 MEQ/100ML IV SOLN
10.0000 meq | INTRAVENOUS | Status: AC
  Administered 2020-10-10 (×2): 10 meq via INTRAVENOUS
  Filled 2020-10-10 (×2): qty 100

## 2020-10-10 MED ORDER — ALUM & MAG HYDROXIDE-SIMETH 200-200-20 MG/5ML PO SUSP
30.0000 mL | Freq: Once | ORAL | Status: AC
  Administered 2020-10-10: 30 mL via ORAL
  Filled 2020-10-10: qty 30

## 2020-10-10 MED ORDER — LOPERAMIDE HCL 2 MG PO CAPS
4.0000 mg | ORAL_CAPSULE | Freq: Three times a day (TID) | ORAL | Status: DC | PRN
  Administered 2020-10-10: 12:00:00 4 mg via ORAL
  Filled 2020-10-10: qty 2

## 2020-10-10 MED ORDER — SALINE SPRAY 0.65 % NA SOLN
1.0000 | NASAL | Status: DC | PRN
  Filled 2020-10-10: qty 44

## 2020-10-10 NOTE — Progress Notes (Signed)
Patient ID: Pamela Lane, female   DOB: 1961-06-24, 60 y.o.   MRN: [id number] [facility] PROGRESS NOTE  AKEISHA LAGERQUIST NFA:[id number] DOB: 14-Feb-1961 DOA: 10/07/2020 PCP: [facility]  HPI/Subjective: Patient was feeling better when I saw her this morning.  She stated she did have diarrhea overnight.  At the end of my interview and exam she had to go the bathroom for another episode of diarrhea.  Her potassium this morning was low.  Initially admitted with DKA and the patient was intubated.  Has had quite a few ER visits and hospitalizations last year and this year.  Objective: Vitals:   10/10/20 0807 10/10/20 1248  BP: 121/81 (!) 153/76  Pulse: 93 100  Resp: 17 15  Temp: 98.8 F (37.1 C) 97.8 F (36.6 C)  SpO2: 95% 97%    Intake/Output Summary (Last 24 hours) at 10/10/2020 1533 Last data filed at 10/10/2020 1419 Gross per 24 hour  Intake 1110.82 ml  Output --  Net 1110.82 ml   Filed Weights   10/08/20 0230  Weight: 79.9 kg    ROS: Review of Systems  Respiratory: Negative for cough and shortness of breath.   Cardiovascular: Negative for chest pain.  Gastrointestinal: Negative for abdominal pain, nausea and vomiting.   Exam: Physical Exam HENT:     Head: Normocephalic.     Mouth/Throat:     Pharynx: No oropharyngeal exudate.  Eyes:     General: Lids are normal.     Conjunctiva/sclera: Conjunctivae normal.     Pupils: Pupils are equal, round, and reactive to light.  Cardiovascular:     Rate and Rhythm: Normal rate and regular rhythm.     Heart sounds: Normal heart sounds, S1 normal and S2 normal.  Pulmonary:     Breath sounds: Normal breath sounds. No decreased breath sounds, wheezing, rhonchi or rales.  Abdominal:     Palpations: Abdomen is soft.     Tenderness: There is no abdominal tenderness.  Musculoskeletal:     Right lower leg: No swelling.     Left lower leg: No swelling.  Skin:    General: Skin is warm.     Findings:  No rash.  Neurological:     Mental Status: She is alert and oriented to person, place, and time.       Data Reviewed: Basic Metabolic Panel: Recent Labs  Lab 10/08/20 0423 10/08/20 0753 10/08/20 1146 10/08/20 1548 10/08/20 2013 10/09/20 0514 10/10/20 0439  NA 138   < > 140 140 140 139 136  K 3.9   < > 3.5 2.9* 3.5 3.2* 2.9*  CL 111   < > 109 109 109 108 104  CO2 12*   < > 22 25 25 25 23   GLUCOSE 376*   < > 181* 185* 169* 258* 265*  BUN 23*   < > 19 17 15 12 7   CREATININE 1.69*   < > 0.99 0.95 0.80 0.76 0.64  CALCIUM 8.1*   < > 8.1* 8.3* 8.1* 8.0* 7.8*  MG 1.8  --   --   --   --  1.9  --   PHOS 4.3  --   --   --   --  1.6*  --    < > = values in this interval not displayed.   Liver Function Tests: Recent Labs  Lab 10/07/20 2219 10/09/20 0514  AST 14*  --   ALT 13  --   ALKPHOS 98  --  BILITOT 1.5*  --   PROT 7.9  --   ALBUMIN 4.2 2.6*   Recent Labs  Lab 10/07/20 2219  LIPASE 33   CBC: Recent Labs  Lab 10/07/20 2219 10/08/20 0130 10/08/20 0753 10/10/20 0439  WBC 18.3* 22.2* 19.7* 5.1  NEUTROABS 15.6*  --   --   --   HGB 15.2* 15.7* 12.8 11.0*  HCT 47.1* 48.2* 35.7* 31.1*  MCV 96.3 95.6 87.9 88.9  PLT 376 413* 316 200   BNP (last 3 results) Recent Labs    10/07/20 2219  BNP 88.9    CBG: Recent Labs  Lab 10/09/20 1129 10/09/20 1605 10/09/20 2109 10/10/20 0804 10/10/20 1254  GLUCAP 228* 304* 175* 291* 140*    Recent Results (from the past 240 hour(s))  Resp Panel by RT-PCR (Flu A&B, Covid) Nasopharyngeal Swab     Status: None   Collection Time: 10/07/20 11:44 PM   Specimen: Nasopharyngeal Swab; Nasopharyngeal(NP) swabs in vial transport medium  Result Value Ref Range Status   SARS Coronavirus 2 by RT PCR NEGATIVE NEGATIVE Final    Comment: (NOTE) SARS-CoV-2 target nucleic acids are NOT DETECTED.  The SARS-CoV-2 RNA is generally detectable in upper respiratory specimens during the acute phase of infection. The lowest concentration  of SARS-CoV-2 viral copies this assay can detect is 138 copies/mL. A negative result does not preclude SARS-Cov-2 infection and should not be used as the sole basis for treatment or other patient management decisions. A negative result may occur with  improper specimen collection/handling, submission of specimen other than nasopharyngeal swab, presence of viral mutation(s) within the areas targeted by this assay, and inadequate number of viral copies(<138 copies/mL). A negative result must be combined with clinical observations, patient history, and epidemiological information. The expected result is Negative.  Fact Sheet for Patients:  BloggerCourse.com  Fact Sheet for Healthcare Providers:  SeriousBroker.it  This test is no t yet approved or cleared by the [place] FDA and  has been authorized for detection and/or diagnosis of SARS-CoV-2 by FDA under an Emergency Use Authorization (EUA). This EUA will remain  in effect (meaning this test can be used) for the duration of the COVID-19 declaration under Section 564(b)(1) of the Act, 21 U.S.C.section 360bbb-3(b)(1), unless the authorization is terminated  or revoked sooner.       Influenza A by PCR NEGATIVE NEGATIVE Final   Influenza B by PCR NEGATIVE NEGATIVE Final    Comment: (NOTE) The Xpert Xpress SARS-CoV-2/FLU/RSV plus assay is intended as an aid in the diagnosis of influenza from Nasopharyngeal swab specimens and should not be used as a sole basis for treatment. Nasal washings and aspirates are unacceptable for Xpert Xpress SARS-CoV-2/FLU/RSV testing.  Fact Sheet for Patients: BloggerCourse.com  Fact Sheet for Healthcare Providers: SeriousBroker.it  This test is not yet approved or cleared by the [place] FDA and has been authorized for detection and/or diagnosis of SARS-CoV-2 by FDA under an Emergency Use  Authorization (EUA). This EUA will remain in effect (meaning this test can be used) for the duration of the COVID-19 declaration under Section 564(b)(1) of the Act, 21 U.S.C. section 360bbb-3(b)(1), unless the authorization is terminated or revoked.  Performed at [facility], [address] [place], [place] [address]   MRSA PCR Screening     Status: None   Collection Time: 10/08/20  2:17 AM   Specimen: Nasopharyngeal  Result Value Ref Range Status   MRSA by PCR NEGATIVE NEGATIVE Final    Comment:  The GeneXpert MRSA Assay (FDA approved for NASAL specimens only), is one component of a comprehensive MRSA colonization surveillance program. It is not intended to diagnose MRSA infection nor to guide or monitor treatment for MRSA infections. Performed at [facility], [address] [place], [place] [address]      Scheduled Meds: . atorvastatin  40 mg Oral QHS  . buPROPion  150 mg Oral BID  . diltiazem  240 mg Oral Daily  . enoxaparin (LOVENOX) injection  40 mg Subcutaneous Q24H  . folic acid  1 mg Oral Daily  . gabapentin  100 mg Oral BID  . insulin aspart  0-15 Units Subcutaneous TID WC  . insulin aspart  0-5 Units Subcutaneous QHS  . insulin aspart  3 Units Subcutaneous TID WC  . insulin glargine  30 Units Subcutaneous Daily  . loratadine  10 mg Oral Daily  . multivitamin with minerals  1 tablet Oral Daily  . potassium chloride  40 mEq Oral BID  . thiamine  100 mg Oral Daily   Continuous Infusions: . sodium chloride 250 mL (10/10/20 0924)    Assessment/Plan:  1. Diabetic ketoacidosis.  This has resolved and the patient is on Lantus insulin and short acting insulin prior to meals.  Continue to monitor sugars. 2. Acute respiratory distress secondary to metabolic derangements.  Patient was intubated and extubated on 10/09/2019.  Patient breathing comfortably on room air. 3. Supraventricular tachycardia.  In the ED required adenosine.   Currently on Cardizem CD 4. Leukocytosis likely secondary to stress response.  This has resolved. 5. Hypokalemia.  Replace potassium IV and orally.  Check magnesium tomorrow. 6. Hypophosphatemia yesterday.  That was replaced.  Will check phosphorus again tomorrow. 7. Cocaine seen in urine toxicology.  Likely contributing to hospitalizations.        Code Status:     Code Status Orders  (From admission, onward)         Start     Ordered   10/07/20 2345  Full code  Continuous        10/07/20 2348        Code Status History    Date Active Date Inactive Code Status Order ID Comments User Context   08/27/2020 1716 08/29/2020 2057 Full Code [id number]  Teddy Spike, DO ED   03/13/2020 2111 03/18/2020 1618 Full Code [id number]  Josephine Igo, DO ED   03/13/2020 2106 03/13/2020 2111 Full Code [id number]  Josephine Igo, DO ED   10/02/2019 1545 10/04/2019 1759 Full Code [id number]  Lucile Shutters, MD ED   05/24/2019 2331 05/26/2019 1451 Full Code [id number]  Joycelyn Das, MD ED   04/01/2019 1610 04/02/2019 1701 Full Code [id number]  Shaune Pollack, MD Inpatient   01/17/2019 1619 01/18/2019 1839 Full Code [id number]  Alford Highland, MD ED   01/02/2019 1512 01/06/2019 1303 Full Code [id number]  Alford Highland, MD ED   12/26/2018 0356 12/30/2018 1055 Full Code [id number]  Arnaldo Natal, MD Inpatient   03/04/2018 2203 03/07/2018 1426 Full Code [id number]  Shaune Pollack, MD Inpatient   02/16/2018 1330 02/18/2018 1412 Full Code [id number]  Bertrum Sol, MD Inpatient   11/26/2017 1421 11/27/2017 1812 Full Code [id number]  Auburn Bilberry, MD Inpatient   10/16/2017 0212 10/17/2017 1427 Full Code [id number]  Enedina Finner, MD Inpatient   09/23/2017 2252 09/24/2017 1505 Full Code [id number]  Cammy Copa, MD Inpatient   05/15/2016 1745 05/16/2016 1749 Full Code [id number]  Auburn Bilberry, MD Inpatient   01/13/2016  [id number] 01/14/2016 1314 Full Code [id number]  Clydie BraunSmith, Rondell A, MD ED   01/08/2016 1916 01/09/2016 1704 Full Code [id number]   Katharina CaperVaickute, Rima, MD Inpatient   Advance Care Planning Activity     Family Communication: Spoke with daughter on the phone Disposition Plan: Status is: Inpatient  Dispo: The patient is from: Home              Anticipated d/c is to: Home potentially tomorrow if electrolytes okay              Patient currently doing better than when she came in.  Replacing electrolytes today.   Difficult to place patient.  No.  Time spent: 28 minutes, case discussed with nursing staff.  Lashell Moffitt [employer]  [facility]

## 2020-10-10 NOTE — Evaluation (Addendum)
Physical Therapy Evaluation Patient Details Name: Pamela Lane MRN: [id number] DOB: 1960/10/06 Today's Date: 10/10/2020   History of Present Illness  Pt is a 60 y/o F admitted from home on 10/07/20 with c/c of chest pain, HA, nausea & vomiting over the past 5-6 days. Pt currently being treated for diabetic ketoacidosis with uncontrolled DM2 likely 2/2 noncompliance. Pt with severe respiratory distress & required emergent intubation on 10/08/20, extubated later in the afternoon. Cardiology consulted & pt not found to have evidence of CHF or acute coronary syndrome. PMH: DM, HTN, asthma, c-diff colitis  Clinical Impression  Pt seen for PT evaluation after MD cleared pt for participation in setting of low K+ (2.9 & pt receiving IV potassium). Pt is able to complete bed mobility with mod I & ambulate 2 laps around nurses station independently without AD. Pt does not demonstrate any overt LOB during mobility & reports she feels she's close to her baseline. No acute PT needs identified, will sign off at this time.   Addendum: Pt reports dizziness when first ambulating out of her room but the reports symptoms are gone.     Follow Up Recommendations Supervision - Intermittent    Equipment Recommendations  None recommended by PT    Recommendations for Other Services       Precautions / Restrictions Precautions Precautions: None Restrictions Weight Bearing Restrictions: No      Mobility  Bed Mobility Overal bed mobility: Modified Independent             General bed mobility comments: supine<>sit with HOB slightly elevated    Transfers Overall transfer level: Independent   Transfers: Sit to/from Stand Sit to Stand: Independent            Ambulation/Gait Ambulation/Gait assistance: Independent Gait Distance (Feet): 375 Feet Assistive device: None Gait Pattern/deviations: WFL(Within Functional Limits) Gait velocity: WNL      Stairs            Wheelchair  Mobility    Modified Rankin (Stroke Patients Only)       Balance Overall balance assessment: Independent                                           Pertinent Vitals/Pain Pain Assessment: No/denies pain    Home Living Family/patient expects to be discharged to:: Private residence Living Arrangements: Spouse/significant other Available Help at Discharge: Family;Available PRN/intermittently Type of Home: Apartment Home Access: Stairs to enter Entrance Stairs-Rails: Right;Left;Can reach both Entrance Stairs-Number of Steps: 3 Home Layout: One level Home Equipment: None Additional Comments: Pt reports 2 falls "last year" but otherwise no falls    Prior Function Level of Independence: Independent         Comments: Independent without AD.     Hand Dominance        Extremity/Trunk Assessment   Upper Extremity Assessment Upper Extremity Assessment: Overall WFL for tasks assessed    Lower Extremity Assessment Lower Extremity Assessment: Overall WFL for tasks assessed       Communication   Communication: No difficulties  Cognition Arousal/Alertness: Awake/alert Behavior During Therapy: WFL for tasks assessed/performed Overall Cognitive Status: Within Functional Limits for tasks assessed                                 General Comments: AxOx3, has difficulty  recalling why she's in the hospital with PT orienting her      General Comments General comments (skin integrity, edema, etc.): HR up to 125 bpm with gait    Exercises     Assessment/Plan    PT Assessment Patent does not need any further PT services  PT Problem List         PT Treatment Interventions      PT Goals (Current goals can be found in the Care Plan section)  Acute Rehab PT Goals Patient Stated Goal: go home PT Goal Formulation: With patient Time For Goal Achievement: 10/24/20 Potential to Achieve Goals: Good    Frequency     Barriers to discharge         Co-evaluation               AM-PAC PT "6 Clicks" Mobility  Outcome Measure Help needed turning from your back to your side while in a flat bed without using bedrails?: None Help needed moving from lying on your back to sitting on the side of a flat bed without using bedrails?: None Help needed moving to and from a bed to a chair (including a wheelchair)?: None Help needed standing up from a chair using your arms (e.g., wheelchair or bedside chair)?: None Help needed to walk in hospital room?: None Help needed climbing 3-5 steps with a railing? : None 6 Click Score: 24    End of Session Equipment Utilized During Treatment: Gait belt Activity Tolerance: Patient tolerated treatment well Patient left: in bed;with call bell/phone within reach;with bed alarm set        Time: 1419-1432 PT Time Calculation (min) (ACUTE ONLY): 13 min   Charges:   PT Evaluation $PT Eval Low Complexity: 1 Low          Aleda Grana, PT, DPT 10/10/20, 2:48 PM   Sandi Mariscal 10/10/2020, 2:43 PM

## 2020-10-10 NOTE — TOC Initial Note (Signed)
Transition of Care [facility]) - Initial/Assessment Note    Patient Details  Name: Pamela Lane MRN: [id number] Date of Birth: Apr 08, 1961  [facility]) CM/SW Contact:    Shelbie Hutching, RN Phone Number: 10/10/2020, 4:09 PM  Clinical Narrative:                 Patient admitted to the hospital for DKA.  RNCM met with patient at the bedside.  Patient is current with [facility], she gets prescriptions from [facility], transportation is with Medicaid transport.  No needs identified.  Patient reports that she lives with her significant other.    Expected Discharge Plan: Home/Self Care Barriers to Discharge: Continued Medical Work up   Patient Goals and CMS Choice        Expected Discharge Plan and Services Expected Discharge Plan: Home/Self Care       Living arrangements for the past 2 months: Single Family Home                                      Prior Living Arrangements/Services Living arrangements for the past 2 months: Single Family Home Lives with:: Significant Other Patient language and need for interpreter reviewed:: Yes Do you feel safe going back to the place where you live?: Yes            Criminal Activity/Legal Involvement Pertinent to Current Situation/Hospitalization: No - Comment as needed  Activities of Daily Living Home Assistive Devices/Equipment: None ADL Screening (condition at time of admission) Patient's cognitive ability adequate to safely complete daily activities?: Yes Is the patient deaf or have difficulty hearing?: No Does the patient have difficulty seeing, even when wearing glasses/contacts?: Yes Does the patient have difficulty concentrating, remembering, or making decisions?: No Patient able to express need for assistance with ADLs?: No Does the patient have difficulty dressing or bathing?: No Independently performs ADLs?: Yes (appropriate for developmental age) Does the patient have difficulty walking  or climbing stairs?: No Weakness of Legs: None Weakness of Arms/Hands: None  Permission Sought/Granted   Permission granted to share information with : No              Emotional Assessment Appearance:: Appears stated age Attitude/Demeanor/Rapport: Engaged Affect (typically observed): Accepting Orientation: : Oriented to Self,Oriented to Place,Oriented to  Time,Oriented to Situation Alcohol / Substance Use: Not Applicable Psych Involvement: No (comment)  Admission diagnosis:  DKA (diabetic ketoacidosis) ([place]) [E11.10] Patient Active Problem List   Diagnosis Date Noted  . Acute respiratory distress   . SVT (supraventricular tachycardia) ([place])   . Leukocytosis   . Hypokalemia   . Hypophosphatemia   . DKA (diabetic ketoacidosis) ([place]) 08/27/2020  . Tobacco abuse 10/04/2019  . DKA, type 2 ([place]) 10/02/2019  . Essential hypertension 10/02/2019  . Bipolar disorder ([place]) 01/17/2019  . Acute pancreatitis 05/15/2016  . AKI (acute kidney injury) ([place])   . Diabetic ketoacidosis without coma associated with type 2 diabetes mellitus ([place])   . Hyperglycemia due to type 2 diabetes mellitus ([place]) 01/13/2016  . UTI (urinary tract infection) 01/13/2016  . DKA (diabetic ketoacidoses) 01/08/2016  . Hyponatremia 01/08/2016  . Dehydration 01/08/2016  . Urinary tract infection 01/08/2016  . Upper abdominal pain 01/08/2016  . Nausea & vomiting 01/08/2016   PCP:  [facility], [place]:   [place] [address] [place] [address]), [place]  [address] [place]) [place] [address] Phone: [phone] Fax: [place], [place] - [address] [place] [address] Phone: [phone] Fax: [place], [place] - [place] [address] Phone: [phone] Fax:  [phone]     Social Determinants of Health (SDOH) Interventions    Readmission Risk Interventions Readmission Risk Prevention Plan 10/10/2020 10/04/2019  Transportation Screening Complete Complete  PCP or Specialist Appt within 3-5 Days Complete Complete  HRI or Home Care Consult Complete Complete  Social Work Consult for [place] Planning/Counseling Complete -  Palliative Care Screening Not Applicable -  Medication Review Press photographer) Complete Complete  Some recent data might be hidden

## 2020-10-10 NOTE — Progress Notes (Signed)
Inpatient Diabetes Program Recommendations  AACE/ADA: New Consensus Statement on Inpatient Glycemic Control (2015)  Target Ranges:  Prepandial:   less than 140 mg/dL      Peak postprandial:   less than 180 mg/dL (1-2 hours)      Critically ill patients:  140 - 180 mg/dL   Results for MAYCEL, RIFFE (MRN [id number]) as of 10/10/2020 10:29  Ref. Range 10/09/2020 07:27 10/09/2020 11:29 10/09/2020 16:05 10/09/2020 21:09  Glucose-Capillary Latest Ref Range: 70 - 99 mg/dL 266 (H)  3 units NOVOLOG  25 units LANTUS  228 (H)  5 units NOVOLOG  304 (H)  14 units NOVOLOG  175 (H)   Results for ELIANIS, FISCHBACH (MRN [id number]) as of 10/10/2020 12:58  Ref. Range 10/10/2020 08:04 10/10/2020 12:54  Glucose-Capillary Latest Ref Range: 70 - 99 mg/dL 291 (H)  11 units NOVOLOG   30 units LANTUS 140 (H)    Admit with: DKA  History: DM  Home DM Meds: Lantus 50 units Daily        Novolog 10 units TID       Metformin 1000 mg Daily  Current Orders: Lantus 30 units Daily      Novolog 3 units TID with meals      Novolog Moderate Correction Scale/ SSI (0-15 units) TID AC + HS    Met with pt at bedside this AM.  Pt told me she is taking her Insulin and Metformin as ordered--Lantus 55 units Daily + Novolog 10 units TID + Metformin 1000 mg BID.  Checks her CBGs at home and told me sometimes her CBGs run high and sometimes has HYPO at home (but infrequent HYPO).  States she knows how to treat HYPO at home.  Has all meds at home.  Told me she stopped seeing [facility] and then stated to me a few minutes later she an appt with [facility] at the end of April--Unsure who she has an appt with as she hasn't been to see ENDO since October and she was supposed to follow up with them in January/February.  Spoke with patient about her current A1c of 13.6%.      Explained what an A1c is and what it measures.  Reminded patient that her goal A1c is 7% or less per ADA standards to prevent both acute and long-term  complications.  Explained to patient the extreme importance of good glucose control at home.  Encouraged patient to check her CBGs at least TID AC at home and to record all CBGs in a logbook for her Endocrinologist to review.  Of note, pt had a pack of cookies (3 medium size cookies) at her bedside.  When asked about her nutrition at home, pt stated she eats well.  When questioned about the cookies, pt stated "they brought them up from the cafeteria and I'm gonna eat them b/c they brought them".  Question if pt also eating poorly at home and eating too may carbohydrates.    --Will follow patient during hospitalization--  Wyn Quaker RN, MSN, CDE Diabetes Coordinator Inpatient Glycemic Control Team Team Pager: [phone] (8a-5p)

## 2020-10-11 DIAGNOSIS — I471 Supraventricular tachycardia: Secondary | ICD-10-CM | POA: Diagnosis not present

## 2020-10-11 DIAGNOSIS — E1111 Type 2 diabetes mellitus with ketoacidosis with coma: Secondary | ICD-10-CM

## 2020-10-11 DIAGNOSIS — R0603 Acute respiratory distress: Secondary | ICD-10-CM | POA: Diagnosis not present

## 2020-10-11 DIAGNOSIS — E876 Hypokalemia: Secondary | ICD-10-CM | POA: Diagnosis not present

## 2020-10-11 DIAGNOSIS — F1410 Cocaine abuse, uncomplicated: Secondary | ICD-10-CM

## 2020-10-11 LAB — BASIC METABOLIC PANEL
Anion gap: 9 (ref 5–15)
BUN: 5 mg/dL — ABNORMAL LOW (ref 6–20)
CO2: 26 mmol/L (ref 22–32)
Calcium: 8.1 mg/dL — ABNORMAL LOW (ref 8.9–10.3)
Chloride: 102 mmol/L (ref 98–111)
Creatinine, Ser: 0.68 mg/dL (ref 0.44–1.00)
GFR, Estimated: >60 mL/min (ref >60)
Glucose, Bld: 286 mg/dL — ABNORMAL HIGH (ref 70–99)
Potassium: 3.3 mmol/L — ABNORMAL LOW (ref 3.5–5.1)
Sodium: 137 mmol/L (ref 135–145)

## 2020-10-11 LAB — MAGNESIUM: Magnesium: 1.9 mg/dL (ref 1.7–2.4)

## 2020-10-11 LAB — HEMOGLOBIN: Hemoglobin: 11.7 g/dL — ABNORMAL LOW (ref 12.0–15.0)

## 2020-10-11 LAB — PHOSPHORUS: Phosphorus: 3.4 mg/dL (ref 2.5–4.6)

## 2020-10-11 LAB — GLUCOSE, CAPILLARY: Glucose-Capillary: 280 mg/dL — ABNORMAL HIGH (ref 70–99)

## 2020-10-11 MED ORDER — PANTOPRAZOLE SODIUM 40 MG PO TBEC
40.0000 mg | DELAYED_RELEASE_TABLET | Freq: Every day | ORAL | Status: DC
Start: 2020-10-11 — End: 2020-10-11

## 2020-10-11 MED ORDER — PANTOPRAZOLE SODIUM 40 MG IV SOLR
40.0000 mg | Freq: Every day | INTRAVENOUS | Status: DC
  Administered 2020-10-11: 40 mg via INTRAVENOUS
  Filled 2020-10-11: qty 40

## 2020-10-11 MED ORDER — POTASSIUM CHLORIDE ER 10 MEQ PO TBCR: 10.0000 meq | EXTENDED_RELEASE_TABLET | Freq: Every day | ORAL | 0 refills | Status: DC

## 2020-10-11 MED ORDER — INSULIN GLARGINE 100 UNIT/ML SOLOSTAR PEN: 30.0000 [IU] | PEN_INJECTOR | Freq: Every day | SUBCUTANEOUS | 0 refills | Status: DC

## 2020-10-11 MED ORDER — THIAMINE HCL 100 MG PO TABS: 100.0000 mg | ORAL_TABLET | Freq: Every day | ORAL | 0 refills | Status: DC

## 2020-10-11 MED ORDER — OMEPRAZOLE MAGNESIUM 20 MG PO TBEC: 20.0000 mg | DELAYED_RELEASE_TABLET | Freq: Every day | ORAL | 0 refills | Status: DC

## 2020-10-11 MED ORDER — DILTIAZEM HCL ER COATED BEADS 240 MG PO CP24: 240.0000 mg | ORAL_CAPSULE | Freq: Every day | ORAL | 0 refills | Status: DC

## 2020-10-11 NOTE — Discharge Summary (Signed)
[facility] [place] [facility]   PATIENT NAME: Pamela Lane    MR#:  [id number]  DATE OF BIRTH:  November 01, 1960  DATE OF ADMISSION:  10/07/2020 ADMITTING PHYSICIAN: Hannah Beat, MD  DATE OF DISCHARGE: 10/11/2020  PRIMARY CARE PHYSICIAN: [facility]    ADMISSION DIAGNOSIS:  DKA (diabetic ketoacidosis) (HCC) [E11.10]  DISCHARGE DIAGNOSIS:  Active Problems:   DKA (diabetic ketoacidosis) (HCC)   Acute respiratory distress   SVT (supraventricular tachycardia) (HCC)   Leukocytosis   Hypokalemia   Hypophosphatemia   SECONDARY DIAGNOSIS:   Past Medical History:  Diagnosis Date  . Asthma   . Clostridium difficile colitis   . Diabetes mellitus without complication (HCC)   . Hypertension   . Mental disorder    pt reports 'I have all of them mental disorders'    HOSPITAL COURSE:   1.  Diabetic ketoacidosis.  The patient was initially admitted to the ICU and started on insulin drip.  This has resolved and the patient is on Lantus insulin and short acting insulin prior to meals.  The patient states that she has her medications at home. 2.  Acute respiratory distress secondary to metabolic derangements.  The patient was intubated by critical care specialist and extubated on 10/09/2019.  Currently the patient is breathing comfortably on room air and this has resolved. 3.  Supraventricular tachycardia.  The patient required adenosine in the emergency room.  The patient was seen by cardiology Dr. Gwen Pounds and would like to follow-up as outpatient in 1 week.  We increased her Cardizem CD to 240 mg daily. 4.  Leukocytosis secondary to stress response.  This has improved into the normal range. 5.  Hypophosphatemia.  This was replaced into the normal range. 6.  Hypokalemia.  The patient received IV and oral potassium yesterday.  Potassium still little bit low today we will give a few days of replacement upon discharge out of the hospital today.   Magnesium low normal range today. 7.  Cocaine seen in the urine toxicology.  This is likely contributing to numerous hospitalizations.  I advised the patient she must do the right thing for herself. 8.  Patient did have a fall in the room today.  The patient states that she is doing okay.  Once I told her I was going to discharge her today she wanted to be discharged immediately.  She was walking around the nursing station.  Once her IV was out she walked herself out of the building and on her way out she said I will see you in 2 weeks. 9.  Diarrhea.  This has resolved.  Stool was brown.  Repeat hemoglobin was 11.7.  I did prescribe omeprazole because she did have some pains yesterday and gas pains yesterday.  Stool softener stopped.  DISCHARGE CONDITIONS:   Fair  CONSULTS OBTAINED:  Treatment Team:  Judithe Modest, NP   Cardiology Dr. Arnoldo Hooker  DRUG ALLERGIES:  No Known Allergies  DISCHARGE MEDICATIONS:   Allergies as of 10/11/2020   No Known Allergies     Medication List    STOP taking these medications   aspirin EC 81 MG tablet   buPROPion 150 MG 12 hr tablet Commonly known as: ZYBAN   cetirizine 10 MG tablet Commonly known as: ZYRTEC     TAKE these medications   albuterol 108 (90 Base) MCG/ACT inhaler Commonly known as: VENTOLIN HFA Inhale 2 puffs into the lungs every 6 (six) hours as needed  for wheezing or shortness of breath.   atorvastatin 40 MG tablet Commonly known as: LIPITOR Take 1 tablet (40 mg total) by mouth at bedtime.   buPROPion 150 MG 12 hr tablet Commonly known as: WELLBUTRIN SR Take 150 mg by mouth 2 (two) times daily.   diltiazem 240 MG 24 hr capsule Commonly known as: CARDIZEM CD Take 1 capsule (240 mg total) by mouth daily. Start taking on: October 12, 2020 What changed:  medication strength how much to take   folic acid 1 MG tablet Commonly known as: FOLVITE Take 1 tablet (1 mg total) by mouth daily.   gabapentin 100 MG  capsule Commonly known as: NEURONTIN Take 1 capsule (100 mg total) by mouth 2 (two) times daily.   insulin glargine 100 UNIT/ML Solostar Pen Commonly known as: LANTUS Inject 30 Units into the skin daily. What changed: how much to take   lisinopril 10 MG tablet Commonly known as: ZESTRIL Take 10 mg by mouth daily.   metFORMIN 500 MG 24 hr tablet Commonly known as: GLUCOPHAGE-XR Take 2 tablets (1,000 mg total) by mouth daily with breakfast.   multivitamin with minerals Tabs tablet Take 1 tablet by mouth daily.   NovoLOG FlexPen 100 UNIT/ML FlexPen Generic drug: insulin aspart Inject 10 Units into the skin 3 (three) times daily with meals.   omeprazole 20 MG tablet Commonly known as: PriLOSEC OTC Take 1 tablet (20 mg total) by mouth daily.   potassium chloride 10 MEQ tablet Commonly known as: KLOR-CON Take 1 tablet (10 mEq total) by mouth daily for 7 days.   thiamine 100 MG tablet Take 1 tablet (100 mg total) by mouth daily.   vitamin B-12 500 MCG tablet Commonly known as: CYANOCOBALAMIN Take 500 mcg by mouth daily.        DISCHARGE INSTRUCTIONS:   Follow-up PMD 5 days Follow-up Dr. Gwen Pounds 1 week  If you experience worsening of your admission symptoms, develop shortness of breath, life threatening emergency, suicidal or homicidal thoughts you must seek medical attention immediately by calling 911 or calling your MD immediately  if symptoms less severe.  You Must read complete instructions/literature along with all the possible adverse reactions/side effects for all the Medicines you take and that have been prescribed to you. Take any new Medicines after you have completely understood and accept all the possible adverse reactions/side effects.   Please note  You were cared for by a hospitalist during your hospital stay. If you have any questions about your discharge medications or the care you received while you were in the hospital after you are discharged, you  can call the unit and asked to speak with the hospitalist on call if the hospitalist that took care of you is not available. Once you are discharged, your primary care physician will handle any further medical issues. Please note that NO REFILLS for any discharge medications will be authorized once you are discharged, as it is imperative that you return to your primary care physician (or establish a relationship with a primary care physician if you do not have one) for your aftercare needs so that they can reassess your need for medications and monitor your lab values.    Today   CHIEF COMPLAINT:   Chief Complaint  Patient presents with  . Chest Pain    HISTORY OF PRESENT ILLNESS:  Pamela Lane  is a 60 y.o. female with a known history of diabetes presented with chest pain and found to be in DKA and  SVT.   VITAL SIGNS:  Blood pressure 139/82, pulse 88, temperature 98 F (36.7 C), temperature source Oral, resp. rate 16, height 5' 2.99" (1.6 m), weight 79.9 kg, SpO2 99 %.   PHYSICAL EXAMINATION:  GENERAL:  60 y.o.-year-old patient lying in the bed with no acute distress.  EYES: Pupils equal, round, reactive to light and accommodation. No scleral icterus.  HEENT: Head atraumatic, normocephalic. Oropharynx and nasopharynx clear.  LUNGS: Normal breath sounds bilaterally, no wheezing, rales,rhonchi or crepitation. No use of accessory muscles of respiration.  CARDIOVASCULAR: S1, S2 normal. No murmurs, rubs, or gallops.  ABDOMEN: Soft, non-tender, non-distended.  EXTREMITIES: No pedal edema.  NEUROLOGIC: Cranial nerves II through XII are intact. Muscle strength 5/5 in all extremities. Gait was fine when she was walking around the nursing station. PSYCHIATRIC: The patient is alert and oriented x 3.  SKIN: No obvious rash, lesion, or ulcer.   DATA REVIEW:   CBC Recent Labs  Lab 10/10/20 0439 10/11/20 0431  WBC 5.1  --   HGB 11.0* 11.7*  HCT 31.1*  --   PLT 200  --      Chemistries  Recent Labs  Lab 10/07/20 2219 10/08/20 0423 10/11/20 0431  NA 129*   < > 137  K 5.1   < > 3.3*  CL 99   < > 102  CO2 <7*   < > 26  GLUCOSE 642*   < > 286*  BUN 19   < > 5*  CREATININE 1.77*   < > 0.68  CALCIUM 9.0   < > 8.1*  MG  --    < > 1.9  AST 14*  --   --   ALT 13  --   --   ALKPHOS 98  --   --   BILITOT 1.5*  --   --    < > = values in this interval not displayed.     Microbiology Results  Results for orders placed or performed during the hospital encounter of 10/07/20  Resp Panel by RT-PCR (Flu A&B, Covid) Nasopharyngeal Swab     Status: None   Collection Time: 10/07/20 11:44 PM   Specimen: Nasopharyngeal Swab; Nasopharyngeal(NP) swabs in vial transport medium  Result Value Ref Range Status   SARS Coronavirus 2 by RT PCR NEGATIVE NEGATIVE Final    Comment: (NOTE) SARS-CoV-2 target nucleic acids are NOT DETECTED.  The SARS-CoV-2 RNA is generally detectable in upper respiratory specimens during the acute phase of infection. The lowest concentration of SARS-CoV-2 viral copies this assay can detect is 138 copies/mL. A negative result does not preclude SARS-Cov-2 infection and should not be used as the sole basis for treatment or other patient management decisions. A negative result may occur with  improper specimen collection/handling, submission of specimen other than nasopharyngeal swab, presence of viral mutation(s) within the areas targeted by this assay, and inadequate number of viral copies(<138 copies/mL). A negative result must be combined with clinical observations, patient history, and epidemiological information. The expected result is Negative.  Fact Sheet for Patients:  BloggerCourse.com  Fact Sheet for Healthcare Providers:  SeriousBroker.it  This test is no t yet approved or cleared by the [place] FDA and  has been authorized for detection and/or diagnosis of SARS-CoV-2  by FDA under an Emergency Use Authorization (EUA). This EUA will remain  in effect (meaning this test can be used) for the duration of the COVID-19 declaration under Section 564(b)(1) of the Act, 21 U.S.C.section 360bbb-3(b)(1), unless the  authorization is terminated  or revoked sooner.       Influenza A by PCR NEGATIVE NEGATIVE Final   Influenza B by PCR NEGATIVE NEGATIVE Final    Comment: (NOTE) The Xpert Xpress SARS-CoV-2/FLU/RSV plus assay is intended as an aid in the diagnosis of influenza from Nasopharyngeal swab specimens and should not be used as a sole basis for treatment. Nasal washings and aspirates are unacceptable for Xpert Xpress SARS-CoV-2/FLU/RSV testing.  Fact Sheet for Patients: BloggerCourse.comhttps://www.fda.gov/media/152166/download  Fact Sheet for Healthcare Providers: SeriousBroker.ithttps://www.fda.gov/media/152162/download  This test is not yet approved or cleared by the [place] FDA and has been authorized for detection and/or diagnosis of SARS-CoV-2 by FDA under an Emergency Use Authorization (EUA). This EUA will remain in effect (meaning this test can be used) for the duration of the COVID-19 declaration under Section 564(b)(1) of the Act, 21 U.S.C. section 360bbb-3(b)(1), unless the authorization is terminated or revoked.  Performed at [facility], [address] [place], [place] [address]   MRSA PCR Screening     Status: None   Collection Time: 10/08/20  2:17 AM   Specimen: Nasopharyngeal  Result Value Ref Range Status   MRSA by PCR NEGATIVE NEGATIVE Final    Comment:        The GeneXpert MRSA Assay (FDA approved for NASAL specimens only), is one component of a comprehensive MRSA colonization surveillance program. It is not intended to diagnose MRSA infection nor to guide or monitor treatment for MRSA infections. Performed at [facility], [address] [place], [place] [address]      Management plans discussed with the patient, family  and they are in agreement.  CODE STATUS:     Code Status Orders  (From admission, onward)         Start     Ordered   10/07/20 2345  Full code  Continuous        10/07/20 2348        Code Status History    Date Active Date Inactive Code Status Order ID Comments User Context   08/27/2020 1716 08/29/2020 2057 Full Code [id number]  Teddy SpikeKyle, Tyrone A, DO ED   03/13/2020 2111 03/18/2020 1618 Full Code [id number]  Josephine IgoIcard, Bradley L, DO ED   03/13/2020 2106 03/13/2020 2111 Full Code [id number]  Josephine IgoIcard, Bradley L, DO ED   10/02/2019 1545 10/04/2019 1759 Full Code [id number]  Lucile ShuttersAgbata, Tochukwu, MD ED   05/24/2019 2331 05/26/2019 1451 Full Code [id number]  Joycelyn DasPokhrel, Laxman, MD ED   04/01/2019 1610 04/02/2019 1701 Full Code [id number]  Shaune Pollackhen, Qing, MD Inpatient   01/17/2019 1619 01/18/2019 1839 Full Code [id number]  Alford HighlandWieting, Zenas Santa, MD ED   01/02/2019 1512 01/06/2019 1303 Full Code [id number]  Alford HighlandWieting, Ethelean Colla, MD ED   12/26/2018 0356 12/30/2018 1055 Full Code [id number]  Arnaldo Nataliamond, Michael S, MD Inpatient   03/04/2018 2203 03/07/2018 1426 Full Code [id number]  Shaune Pollackhen, Qing, MD Inpatient   02/16/2018 1330 02/18/2018 1412 Full Code [id number]  Bertrum SolSalary, Montell D, MD Inpatient   11/26/2017 1421 11/27/2017 1812 Full Code [id number]  Auburn BilberryPatel, Shreyang, MD Inpatient   10/16/2017 0212 10/17/2017 1427 Full Code [id number]  Enedina FinnerPatel, Sona, MD Inpatient   09/23/2017 2252 09/24/2017 1505 Full Code [id number]  Cammy CopaMaier, Angela, MD Inpatient   05/15/2016 1745 05/16/2016 1749 Full Code [id number]  Auburn BilberryPatel, Shreyang, MD Inpatient   01/13/2016 0051 01/14/2016 1314 Full Code [id number]  Clydie BraunSmith, Rondell A, MD ED   01/08/2016 1916 01/09/2016 1704 Full Code [id number]  Katharina CaperVaickute, Rima, MD Inpatient  Advance Care Planning Activity      TOTAL TIME TAKING CARE OF THIS PATIENT: 35 minutes.    Alford Highland M.D on 10/11/2020 at 11:20 AM  Between 7am to 6pm - Pager - [phone]  After 6pm go to [email or web address] - Social research officer, government  [facility]  CC: Primary care physician;  [facility]

## 2020-10-11 NOTE — Progress Notes (Signed)
MD order received in [facility] to discharge pt home today; verbally reviewed AVS with pt, Rxs escribed to [facility] on [address] in [place], [place]; no questions voiced; pt verbally refused wheelchair wanted to ambulate to the [facility] entrance where her discharge ride is waiting; pt ambulated to the [facility] entrance

## 2020-11-15 ENCOUNTER — Other Ambulatory Visit: Payer: Self-pay

## 2020-11-15 ENCOUNTER — Inpatient Hospital Stay
Admission: EM | Admit: 2020-11-15 | Discharge: 2020-11-18 | DRG: 638 | Disposition: A | Discharge status: Home / Self Care | Payer: Medicaid Other | Attending: Internal Medicine | Admitting: Internal Medicine

## 2020-11-15 ENCOUNTER — Emergency Department: Payer: Medicaid Other

## 2020-11-15 DIAGNOSIS — Z794 Long term (current) use of insulin: Secondary | ICD-10-CM

## 2020-11-15 DIAGNOSIS — E1121 Type 2 diabetes mellitus with diabetic nephropathy: Secondary | ICD-10-CM | POA: Diagnosis present

## 2020-11-15 DIAGNOSIS — E785 Hyperlipidemia, unspecified: Secondary | ICD-10-CM | POA: Diagnosis present

## 2020-11-15 DIAGNOSIS — E1110 Type 2 diabetes mellitus with ketoacidosis without coma: Principal | ICD-10-CM | POA: Diagnosis present

## 2020-11-15 DIAGNOSIS — N179 Acute kidney failure, unspecified: Secondary | ICD-10-CM | POA: Diagnosis present

## 2020-11-15 DIAGNOSIS — F17210 Nicotine dependence, cigarettes, uncomplicated: Secondary | ICD-10-CM | POA: Diagnosis present

## 2020-11-15 DIAGNOSIS — D72829 Elevated white blood cell count, unspecified: Secondary | ICD-10-CM | POA: Diagnosis present

## 2020-11-15 DIAGNOSIS — F32A Depression, unspecified: Secondary | ICD-10-CM | POA: Diagnosis present

## 2020-11-15 DIAGNOSIS — Z79899 Other long term (current) drug therapy: Secondary | ICD-10-CM

## 2020-11-15 DIAGNOSIS — E878 Other disorders of electrolyte and fluid balance, not elsewhere classified: Secondary | ICD-10-CM | POA: Diagnosis present

## 2020-11-15 DIAGNOSIS — Z90710 Acquired absence of both cervix and uterus: Secondary | ICD-10-CM

## 2020-11-15 DIAGNOSIS — E871 Hypo-osmolality and hyponatremia: Secondary | ICD-10-CM | POA: Diagnosis present

## 2020-11-15 DIAGNOSIS — Z9111 Patient's noncompliance with dietary regimen: Secondary | ICD-10-CM

## 2020-11-15 DIAGNOSIS — E538 Deficiency of other specified B group vitamins: Secondary | ICD-10-CM | POA: Diagnosis present

## 2020-11-15 DIAGNOSIS — I10 Essential (primary) hypertension: Secondary | ICD-10-CM | POA: Diagnosis present

## 2020-11-15 DIAGNOSIS — J45909 Unspecified asthma, uncomplicated: Secondary | ICD-10-CM | POA: Diagnosis present

## 2020-11-15 DIAGNOSIS — Z8249 Family history of ischemic heart disease and other diseases of the circulatory system: Secondary | ICD-10-CM

## 2020-11-15 DIAGNOSIS — Z20822 Contact with and (suspected) exposure to covid-19: Secondary | ICD-10-CM | POA: Diagnosis present

## 2020-11-15 DIAGNOSIS — Z7984 Long term (current) use of oral hypoglycemic drugs: Secondary | ICD-10-CM

## 2020-11-15 DIAGNOSIS — E860 Dehydration: Secondary | ICD-10-CM | POA: Diagnosis present

## 2020-11-15 MED ORDER — LACTATED RINGERS IV BOLUS (SEPSIS)
1000.0000 mL | Freq: Once | INTRAVENOUS | Status: AC
  Administered 2020-11-15: 1000 mL via INTRAVENOUS

## 2020-11-15 NOTE — ED Provider Notes (Signed)
[facility] Emergency Department Provider Note  ____________________________________________   Event Date/Time   First MD Initiated Contact with Patient 11/15/20 2309     (approximate)  I have reviewed the triage vital signs and the nursing notes.   HISTORY  Chief Complaint Abdominal Pain  Level 5 caveat:  history/ROS limited by acute/critical illness and being a vague historian.  HPI Pamela Lane is a 60 y.o. female with medical history as listed below who presents by EMS for abdominal pain and shortness of breath.  She said that she is had all of her symptoms for weeks but has been gradually getting worse and has become severe.  She is not able to provide much additional history, just says "my belly hurts" and "sometimes I am short of breath".  She said that she is very thirsty.  She said that she does not think her diabetes medicine is working so she does not use it very often.  She denies drugs and alcohol.         Past Medical History:  Diagnosis Date  . Asthma   . Clostridium difficile colitis   . Diabetes mellitus without complication (HCC)   . Hypertension   . Mental disorder    pt reports 'I have all of them mental disorders'    Patient Active Problem List   Diagnosis Date Noted  . Cocaine abuse (HCC)   . Acute respiratory distress   . SVT (supraventricular tachycardia) (HCC)   . Leukocytosis   . Hypokalemia   . Hypophosphatemia   . DKA (diabetic ketoacidosis) (HCC) 08/27/2020  . Tobacco abuse 10/04/2019  . DKA, type 2 (HCC) 10/02/2019  . Essential hypertension 10/02/2019  . Bipolar disorder (HCC) 01/17/2019  . Acute pancreatitis 05/15/2016  . AKI (acute kidney injury) (HCC)   . Diabetic ketoacidosis without coma associated with type 2 diabetes mellitus (HCC)   . Hyperglycemia due to type 2 diabetes mellitus (HCC) 01/13/2016  . UTI (urinary tract infection) 01/13/2016  . DKA (diabetic ketoacidoses) 01/08/2016  .  Hyponatremia 01/08/2016  . Dehydration 01/08/2016  . Urinary tract infection 01/08/2016  . Upper abdominal pain 01/08/2016  . Nausea & vomiting 01/08/2016    Past Surgical History:  Procedure Laterality Date  . ABDOMINAL HYSTERECTOMY      Prior to Admission medications   Medication Sig Start Date End Date Taking? Authorizing Provider  albuterol (PROVENTIL HFA;VENTOLIN HFA) 108 (90 Base) MCG/ACT inhaler Inhale 2 puffs into the lungs every 6 (six) hours as needed for wheezing or shortness of breath. 02/18/18  Yes Auburn Bilberry, MD  atorvastatin (LIPITOR) 40 MG tablet TAKE 1 TABLET BY MOUTH AT BEDTIME. 08/29/20 08/29/21 Yes Hall, Carole N, DO  buPROPion (WELLBUTRIN SR) 150 MG 12 hr tablet Take 150 mg by mouth 2 (two) times daily. 10/01/20  Yes [provider]  diltiazem (CARDIZEM CD) 240 MG 24 hr capsule Take 1 capsule (240 mg total) by mouth daily. 10/12/20  Yes Wieting, Richard, MD  folic acid (FOLVITE) 1 MG tablet Take 1 tablet (1 mg total) by mouth daily. 03/08/18  Yes Gouru, Aruna, MD  gabapentin (NEURONTIN) 100 MG capsule TAKE 1 CAPSULE BY MOUTH 2 TIMES DAILY. 08/29/20 08/29/21 Yes Hall, Carole N, DO  insulin glargine (LANTUS) 100 UNIT/ML Solostar Pen Inject 30 Units into the skin daily. 10/11/20 01/09/21 Yes Wieting, Richard, MD  lisinopril (ZESTRIL) 10 MG tablet Take 10 mg by mouth daily. 10/01/20  Yes [provider]  metFORMIN (GLUCOPHAGE-XR) 500 MG 24 hr  tablet TAKE 2 TABLETS (1,000 MG TOTAL) BY MOUTH DAILY WITH BREAKFAST. 08/29/20 08/29/21 Yes Darlin Drop, DO  Multiple Vitamin (MULTIVITAMIN WITH MINERALS) TABS tablet Take 1 tablet by mouth daily. 03/08/18  Yes Gouru, Aruna, MD  NOVOLOG FLEXPEN 100 UNIT/ML FlexPen INJECT 10 UNITS INTO THE SKIN 3 TIMES DAILY WITH MEALS. 08/29/20 08/29/21 Yes Hall, Carole N, DO  omeprazole (PRILOSEC) 20 MG capsule Take 1 capsule by mouth daily. 10/11/20  Yes [provider]  thiamine 100 MG tablet Take 1 tablet (100 mg total) by mouth  daily. 10/11/20  Yes Wieting, Richard, MD  vitamin B-12 (CYANOCOBALAMIN) 500 MCG tablet Take 500 mcg by mouth daily.   Yes [provider]    Allergies Patient has no known allergies.  Family History  Problem Relation Age of Onset  . Hypertension Mother     Social History Social History   Tobacco Use  . Smoking status: Current Every Day Smoker    Packs/day: 0.25    Types: Cigarettes  . Smokeless tobacco: Never Used  Vaping Use  . Vaping Use: Never used  Substance Use Topics  . Alcohol use: Yes    Alcohol/week: 40.0 standard drinks    Types: 40 Cans of beer per week    Comment: 3 big bottles of liquor  . Drug use: Yes    Types: Marijuana, Cocaine    Review of Systems Level 5 caveat:  history/ROS limited by acute/critical illness and being a vague historian.  ____________________________________________   PHYSICAL EXAM:  VITAL SIGNS: ED Triage Vitals  Enc Vitals Group     BP 11/15/20 2314 107/70     Pulse Rate 11/15/20 2314 97     Resp 11/15/20 2314 17     Temp 11/15/20 2314 97.8 F (36.6 C)     Temp Source 11/15/20 2314 Oral     SpO2 11/15/20 2312 100 %     Weight 11/15/20 2314 79.9 kg (176 lb 2.4 oz)     Height 11/15/20 2314 1.6 m (5' 2.99")     Head Circumference --      Peak Flow --      Pain Score 11/15/20 2314 10     Pain Loc --      Pain Edu? --      Excl. in GC? --     Constitutional: Alert and oriented  Eyes: Conjunctivae are normal.  Head: Atraumatic. Nose: No congestion/rhinnorhea. Mouth/Throat: Patient is wearing a mask. Neck: No stridor.  No meningeal signs.   Cardiovascular: Borderline tachycardia, regular rhythm. Good peripheral circulation. Respiratory: Normal respiratory effort with no wheezes, rales, nor rhonchi. Gastrointestinal: Soft with diffuse tenderness to palpation throughout with no localized peritonitis. Musculoskeletal: No lower extremity tenderness nor edema. No gross deformities of extremities. Neurologic:  Normal  speech and language. No gross focal neurologic deficits are appreciated.  Skin:  Skin is warm, dry and intact.   ____________________________________________   LABS (all labs ordered are listed, but only abnormal results are displayed)  Labs Reviewed  LACTIC ACID, PLASMA - Abnormal; Notable for the following components:      Result Value   Lactic Acid, Venous 2.2 (*)    All other components within normal limits  COMPREHENSIVE METABOLIC PANEL - Abnormal; Notable for the following components:   Sodium 126 (*)    Chloride 93 (*)    CO2 13 (*)    Glucose, Bld 878 (*)    BUN 39 (*)    Creatinine, Ser 2.79 (*)  Calcium 8.8 (*)    AST 10 (*)    Total Bilirubin 1.5 (*)    GFR, Estimated 19 (*)    Anion gap 20 (*)    All other components within normal limits  CBC WITH DIFFERENTIAL/PLATELET - Abnormal; Notable for the following components:   WBC 12.9 (*)    Hemoglobin 15.2 (*)    Neutro Abs 11.3 (*)    All other components within normal limits  URINALYSIS, COMPLETE (UACMP) WITH MICROSCOPIC - Abnormal; Notable for the following components:   Color, Urine YELLOW (*)    APPearance CLOUDY (*)    Glucose, UA >=500 (*)    Ketones, ur 20 (*)    Protein, ur 30 (*)    All other components within normal limits  APTT - Abnormal; Notable for the following components:   aPTT 22 (*)    All other components within normal limits  BLOOD GAS, VENOUS - Abnormal; Notable for the following components:   pH, Ven 7.19 (*)    pCO2, Ven 34 (*)    Bicarbonate 13.0 (*)    Acid-base deficit 14.2 (*)    All other components within normal limits  BASIC METABOLIC PANEL - Abnormal; Notable for the following components:   Sodium 129 (*)    CO2 11 (*)    Glucose, Bld 476 (*)    BUN 36 (*)    Creatinine, Ser 2.40 (*)    GFR, Estimated 23 (*)    Anion gap 18 (*)    All other components within normal limits  BETA-HYDROXYBUTYRIC ACID - Abnormal; Notable for the following components:   Beta-Hydroxybutyric  Acid 3.77 (*)    All other components within normal limits  CBC - Abnormal; Notable for the following components:   WBC 13.5 (*)    MCHC 36.2 (*)    All other components within normal limits  LACTIC ACID, PLASMA - Abnormal; Notable for the following components:   Lactic Acid, Venous 3.2 (*)    All other components within normal limits  GLUCOSE, CAPILLARY - Abnormal; Notable for the following components:   Glucose-Capillary 572 (*)    All other components within normal limits  GLUCOSE, CAPILLARY - Abnormal; Notable for the following components:   Glucose-Capillary 486 (*)    All other components within normal limits  GLUCOSE, CAPILLARY - Abnormal; Notable for the following components:   Glucose-Capillary 349 (*)    All other components within normal limits  GLUCOSE, CAPILLARY - Abnormal; Notable for the following components:   Glucose-Capillary 158 (*)    All other components within normal limits  GLUCOSE, CAPILLARY - Abnormal; Notable for the following components:   Glucose-Capillary 178 (*)    All other components within normal limits  CBG MONITORING, ED - Abnormal; Notable for the following components:   Glucose-Capillary >600 (*)    All other components within normal limits  CBG MONITORING, ED - Abnormal; Notable for the following components:   Glucose-Capillary >600 (*)    All other components within normal limits  CBG MONITORING, ED - Abnormal; Notable for the following components:   Glucose-Capillary >600 (*)    All other components within normal limits  CULTURE, BLOOD (SINGLE)  RESP PANEL BY RT-PCR (FLU A&B, COVID) ARPGX2  URINE CULTURE  MAGNESIUM  PROTIME-INR  BASIC METABOLIC PANEL  BASIC METABOLIC PANEL  BASIC METABOLIC PANEL  BETA-HYDROXYBUTYRIC ACID  BETA-HYDROXYBUTYRIC ACID  HEMOGLOBIN A1C  TROPONIN I (HIGH SENSITIVITY)  TROPONIN I (HIGH SENSITIVITY)   ____________________________________________  EKG  ED ECG REPORT I, Loleta Roseory Cas Tracz, the attending  physician, personally viewed and interpreted this ECG.  Date: 11/15/2020 EKG Time: 23: 13 Rate: 96 Rhythm: normal sinus rhythm QRS Axis: normal Intervals: Prolonged QTC at 540 ms ST/T Wave abnormalities: Non-specific ST segment / T-wave changes, but no clear evidence of acute ischemia. Narrative Interpretation: no definitive evidence of acute ischemia; does not meet STEMI criteria.   ____________________________________________  RADIOLOGY I, Loleta Roseory Ankush Gintz, personally viewed and evaluated these images (plain radiographs) as part of my medical decision making, as well as reviewing the written report by the radiologist.  ED MD interpretation: No acute abnormalities identified on chest x-ray nor CT scan of the abdomen and pelvis  Official radiology report(s): CT ABDOMEN PELVIS WO CONTRAST  Result Date: 11/16/2020 CLINICAL DATA:  60 year old female with abdominal pain. EXAM: CT ABDOMEN AND PELVIS WITHOUT CONTRAST TECHNIQUE: Multidetector CT imaging of the abdomen and pelvis was performed following the standard protocol without IV contrast. COMPARISON:  CT abdomen pelvis dated 12/28/2018. FINDINGS: Evaluation of this exam is limited in the absence of intravenous contrast as well as due to respiratory motion artifact. Lower chest: The visualized lung bases are clear. There is coronary vascular calcification. No intra-abdominal free air or free fluid. Hepatobiliary: No focal liver abnormality is seen. No gallstones, gallbladder wall thickening, or biliary dilatation. Pancreas: Unremarkable. No pancreatic ductal dilatation or surrounding inflammatory changes. Spleen: Normal in size without focal abnormality. Adrenals/Urinary Tract: The adrenal glands unremarkable. The kidneys, visualized ureters, and urinary bladder appear unremarkable. Stomach/Bowel: There is no bowel obstruction or active inflammation. The appendix is normal. Vascular/Lymphatic: Moderate aortoiliac atherosclerotic disease. The IVC is  unremarkable. No portal venous gas. There is no adenopathy. Reproductive: Hysterectomy.  No adnexal masses. Other: None Musculoskeletal: Degenerative changes of the spine. No acute osseous pathology. IMPRESSION: 1. No acute intra-abdominal or pelvic pathology. No bowel obstruction. Normal appendix. 2. Aortic Atherosclerosis (ICD10-I70.0). Electronically Signed   By: Elgie CollardArash  Radparvar M.D.   On: 11/16/2020 01:31   DG Chest Port 1 View  Result Date: 11/15/2020 CLINICAL DATA:  Possible sepsis EXAM: PORTABLE CHEST 1 VIEW COMPARISON:  10/08/2020 FINDINGS: The heart size and mediastinal contours are within normal limits. Both lungs are clear. The visualized skeletal structures are unremarkable. IMPRESSION: No active disease. Electronically Signed   By: Alcide CleverMark  Lukens M.D.   On: 11/15/2020 23:39    ____________________________________________   PROCEDURES   Procedure(s) performed (including Critical Care):  .Critical Care Performed by: Loleta RoseForbach, Tanji Storrs, MD Authorized by: Loleta RoseForbach, Latausha Flamm, MD   Critical care provider statement:    Critical care time (minutes):  45   Critical care time was exclusive of:  Separately billable procedures and treating other patients   Critical care was necessary to treat or prevent imminent or life-threatening deterioration of the following conditions:  Metabolic crisis   Critical care was time spent personally by me on the following activities:  Development of treatment plan with patient or surrogate, discussions with consultants, evaluation of patient's response to treatment, examination of patient, obtaining history from patient or surrogate, ordering and performing treatments and interventions, ordering and review of laboratory studies, ordering and review of radiographic studies, pulse oximetry, re-evaluation of patient's condition and review of old charts .1-3 Lead EKG Interpretation Performed by: Loleta RoseForbach, Aamari West, MD Authorized by: Loleta RoseForbach, Shahla Betsill, MD     Interpretation:  normal     ECG rate:  95   ECG rate assessment: normal     Rhythm: sinus rhythm     Ectopy: none  Conduction: normal       ____________________________________________   INITIAL IMPRESSION / MDM / ASSESSMENT AND PLAN / ED COURSE  As part of my medical decision making, I reviewed the following data within the electronic MEDICAL RECORD NUMBER Nursing notes reviewed and incorporated, Labs reviewed , EKG interpreted , Old chart reviewed, Discussed with admitting physician  and Notes from prior ED visits   Differential diagnosis includes, but is not limited to, DKA, nonspecific intra-abdominal infection, kidney injury, pneumonia, urinary tract infection, sepsis.  The patient is on the cardiac monitor to evaluate for evidence of arrhythmia and/or significant heart rate changes.  Patient not able to provide much history.  She has a history of DKA as well as multiple other chronic issues that could be acutely worse tonight.  Vital signs are generally reassuring with a heart rate greater than 90 but less than 100.  She does not meet sepsis criteria based on her initial presentation but I ordered the "possible sepsis" orders for a broad evaluation and requested an in and out catheterization for urine.  I ordered 1 L lactated Ringer's to begin rehydration.     Clinical Course as of 11/16/20 8338  Wynelle Link Nov 16, 2020  0005 DG Chest [place] 1 [address] I personally reviewed the patient's imaging and agree with the radiologist's interpretation that there is no evidence of an acute abnormality on the chest x-ray. [CF]  0033 Urinalysis, Complete w Microscopic Urine, Catheterized(!) Notable for ketones, no obvious infection [CF]  0033 WBC(!): 12.9 [CF]  0034 Lactic Acid, Venous(!!): 2.2 [CF]  0040 Lab called with a report that the patient's glucose was greater than 800 and her lactic acid is 2.2.  She has a leukocytosis of 12.9.  Technically she meets sepsis criteria at this point although I suspect her symptoms  are actually due to DKA or HHS.  She has no clear source of infection yet.  I will obtain a noncontrast CT of the abdomen and pelvis since she is reporting abdominal pain and there is a Sport and exercise psychologist of IV contrast but I also do not yet know if she has normal kidney function.  Once the full labs have been reported for the metabolic panel anticipate initiating treatment for DKA or HHS per protocol, but I also need to know her electrolytes. [CF]  0050 SARS Coronavirus 2 by RT PCR: NEGATIVE [CF]  0050 Comprehensive metabolic panel(!!) Comprehensive metabolic panel is substantially abnormal with a glucose of 878, acute renal failure with creatinine of 2.79 and typically she is less than 1, BUN of 39, anion gap of 20.  I will initiate treatment as per DKA protocol but I am also proceeding with the CT scan of the abdomen and pelvis without contrast to look for any evidence of an acute intra-abdominal issue that would have led to her DKA although it most likely has to do with medication noncompliance since she reported that she has not been using it regularly. [CF]  [id number] Of note, even though she technically meets sepsis criteria, I do not have a source and again I think that most likely her lab abnormalities are due to DKA, not infection.  I will await the results of the CT scan and focus on treatment of her DKA initially. [CF]  [id number] I have already ordered 2 L of lactated Ringer's for her IV bolus and then we will proceed with fluid orders as per the DKA protocol. [CF]  0151 SARS Coronavirus 2 by RT PCR: NEGATIVE [CF]  0151 No acute abnormalities on CT scan of the abdomen and pelvis.  No indication for empiric antibiotics.  Consulting hospitalist for admission for DKA. [CF]  0216 Discussed case by phone with Dr. Arville Care who will admit. [CF]    Clinical Course User Index [CF] Loleta Rose, MD     ____________________________________________  FINAL CLINICAL IMPRESSION(S) / ED DIAGNOSES  Final diagnoses:   Diabetic ketoacidosis without coma associated with type 2 diabetes mellitus (HCC)     MEDICATIONS GIVEN DURING THIS VISIT:  Medications  insulin regular, human (MYXREDLIN) 100 units/ 100 mL infusion (0 Units/hr Intravenous Stopped 11/16/20 0630)  lactated ringers infusion (has no administration in time range)  dextrose 5 % in lactated ringers infusion ( Intravenous New Bag/Given 11/16/20 0725)  dextrose 50 % solution 0-50 mL (has no administration in time range)  atorvastatin (LIPITOR) tablet 40 mg (has no administration in time range)  diltiazem (CARDIZEM CD) 24 hr capsule 240 mg (has no administration in time range)  buPROPion (WELLBUTRIN SR) 12 hr tablet 150 mg (has no administration in time range)  pantoprazole (PROTONIX) EC tablet 40 mg (has no administration in time range)  folic acid (FOLVITE) tablet 1 mg (has no administration in time range)  vitamin B-12 (CYANOCOBALAMIN) tablet 500 mcg (has no administration in time range)  gabapentin (NEURONTIN) capsule 100 mg (has no administration in time range)  multivitamin with minerals tablet 1 tablet (has no administration in time range)  thiamine tablet 100 mg (has no administration in time range)  albuterol (VENTOLIN HFA) 108 (90 Base) MCG/ACT inhaler 2 puff (has no administration in time range)  enoxaparin (LOVENOX) injection 40 mg (40 mg Subcutaneous Given 11/16/20 0756)  acetaminophen (TYLENOL) tablet 650 mg (has no administration in time range)    Or  acetaminophen (TYLENOL) suppository 650 mg (has no administration in time range)  traZODone (DESYREL) tablet 25 mg (has no administration in time range)  magnesium hydroxide (MILK OF MAGNESIA) suspension 30 mL (has no administration in time range)  ondansetron (ZOFRAN) tablet 4 mg (has no administration in time range)    Or  ondansetron (ZOFRAN) injection 4 mg (has no administration in time range)  0.9 %  sodium chloride infusion ( Intravenous Infusion Verify 11/16/20 0600)  midodrine  (PROAMATINE) tablet 10 mg (10 mg Oral Given 11/16/20 0640)  lactated ringers bolus 1,000 mL (0 mLs Intravenous Stopped 11/16/20 0156)  lactated ringers bolus 1,000 mL (1,000 mLs Intravenous New Bag/Given 11/16/20 0125)  sodium chloride 0.9 % bolus 500 mL ( Intravenous Stopped 11/16/20 0516)     ED Discharge Orders    None      *Please note:  AJLA MCGEACHY was evaluated in Emergency Department on 11/16/2020 for the symptoms described in the history of present illness. She was evaluated in the context of the global COVID-19 pandemic, which necessitated consideration that the patient might be at risk for infection with the SARS-CoV-2 virus that causes COVID-19. Institutional protocols and algorithms that pertain to the evaluation of patients at risk for COVID-19 are in a state of rapid change based on information released by regulatory bodies including the CDC and federal and state organizations. These policies and algorithms were followed during the patient's care in the ED.  Some ED evaluations and interventions may be delayed as a result of limited staffing during and after the pandemic.*  Note:  This document was prepared using Dragon voice recognition software and may include unintentional dictation errors.   Loleta Rose, MD 11/16/20 [phone]

## 2020-11-15 NOTE — ED Triage Notes (Signed)
Patient arrived via EMS from home for abdominal pain, vomiting, burning while urinating (1 week), and black stools (1 week). Pt initially called EMS for shortness of breath, but EMS did not notice any SHOB. CBG was 565.

## 2020-11-16 ENCOUNTER — Emergency Department: Payer: Medicaid Other

## 2020-11-16 DIAGNOSIS — N179 Acute kidney failure, unspecified: Secondary | ICD-10-CM | POA: Diagnosis not present

## 2020-11-16 DIAGNOSIS — J45909 Unspecified asthma, uncomplicated: Secondary | ICD-10-CM | POA: Diagnosis present

## 2020-11-16 DIAGNOSIS — Z7984 Long term (current) use of oral hypoglycemic drugs: Secondary | ICD-10-CM | POA: Diagnosis not present

## 2020-11-16 DIAGNOSIS — G629 Polyneuropathy, unspecified: Secondary | ICD-10-CM | POA: Diagnosis not present

## 2020-11-16 DIAGNOSIS — N189 Chronic kidney disease, unspecified: Secondary | ICD-10-CM

## 2020-11-16 DIAGNOSIS — E785 Hyperlipidemia, unspecified: Secondary | ICD-10-CM | POA: Diagnosis not present

## 2020-11-16 DIAGNOSIS — E878 Other disorders of electrolyte and fluid balance, not elsewhere classified: Secondary | ICD-10-CM | POA: Diagnosis present

## 2020-11-16 DIAGNOSIS — Z794 Long term (current) use of insulin: Secondary | ICD-10-CM | POA: Diagnosis not present

## 2020-11-16 DIAGNOSIS — Z8249 Family history of ischemic heart disease and other diseases of the circulatory system: Secondary | ICD-10-CM | POA: Diagnosis not present

## 2020-11-16 DIAGNOSIS — E1110 Type 2 diabetes mellitus with ketoacidosis without coma: Secondary | ICD-10-CM | POA: Diagnosis not present

## 2020-11-16 DIAGNOSIS — E860 Dehydration: Secondary | ICD-10-CM | POA: Diagnosis present

## 2020-11-16 DIAGNOSIS — E871 Hypo-osmolality and hyponatremia: Secondary | ICD-10-CM | POA: Diagnosis present

## 2020-11-16 DIAGNOSIS — Z90710 Acquired absence of both cervix and uterus: Secondary | ICD-10-CM | POA: Diagnosis not present

## 2020-11-16 DIAGNOSIS — F17210 Nicotine dependence, cigarettes, uncomplicated: Secondary | ICD-10-CM | POA: Diagnosis present

## 2020-11-16 DIAGNOSIS — E1121 Type 2 diabetes mellitus with diabetic nephropathy: Secondary | ICD-10-CM | POA: Diagnosis present

## 2020-11-16 DIAGNOSIS — D72829 Elevated white blood cell count, unspecified: Secondary | ICD-10-CM | POA: Diagnosis present

## 2020-11-16 DIAGNOSIS — F32A Depression, unspecified: Secondary | ICD-10-CM | POA: Diagnosis present

## 2020-11-16 DIAGNOSIS — E538 Deficiency of other specified B group vitamins: Secondary | ICD-10-CM | POA: Diagnosis present

## 2020-11-16 DIAGNOSIS — I10 Essential (primary) hypertension: Secondary | ICD-10-CM | POA: Diagnosis present

## 2020-11-16 DIAGNOSIS — Z79899 Other long term (current) drug therapy: Secondary | ICD-10-CM | POA: Diagnosis not present

## 2020-11-16 DIAGNOSIS — Z20822 Contact with and (suspected) exposure to covid-19: Secondary | ICD-10-CM | POA: Diagnosis present

## 2020-11-16 DIAGNOSIS — Z9111 Patient's noncompliance with dietary regimen: Secondary | ICD-10-CM | POA: Diagnosis not present

## 2020-11-16 LAB — LACTIC ACID, PLASMA
Lactic Acid, Venous: 2.2 mmol/L (ref 0.5–1.9)
Lactic Acid, Venous: 3.2 mmol/L (ref 0.5–1.9)

## 2020-11-16 LAB — CBC WITH DIFFERENTIAL/PLATELET
Abs Immature Granulocytes: 0.06 10*3/uL (ref 0.00–0.07)
Basophils Absolute: 0.1 10*3/uL (ref 0.0–0.1)
Basophils Relative: 0 %
Eosinophils Absolute: 0 10*3/uL (ref 0.0–0.5)
Eosinophils Relative: 0 %
HCT: 44.6 % (ref 36.0–46.0)
Hemoglobin: 15.2 g/dL — ABNORMAL HIGH (ref 12.0–15.0)
Immature Granulocytes: 1 %
Lymphocytes Relative: 6 %
Lymphs Abs: 0.8 10*3/uL (ref 0.7–4.0)
MCH: 31 pg (ref 26.0–34.0)
MCHC: 34.1 g/dL (ref 30.0–36.0)
MCV: 90.8 fL (ref 80.0–100.0)
Monocytes Absolute: 0.7 10*3/uL (ref 0.1–1.0)
Monocytes Relative: 5 %
Neutro Abs: 11.3 10*3/uL — ABNORMAL HIGH (ref 1.7–7.7)
Neutrophils Relative %: 88 %
Platelets: 311 10*3/uL (ref 150–400)
RBC: 4.91 MIL/uL (ref 3.87–5.11)
RDW: 12.2 % (ref 11.5–15.5)
WBC: 12.9 10*3/uL — ABNORMAL HIGH (ref 4.0–10.5)
nRBC: 0 % (ref 0.0–0.2)

## 2020-11-16 LAB — URINALYSIS, COMPLETE (UACMP) WITH MICROSCOPIC
Bacteria, UA: NONE SEEN
Bilirubin Urine: NEGATIVE
Glucose, UA: >=500 mg/dL — AB
Hgb urine dipstick: NEGATIVE
Ketones, ur: 20 mg/dL — AB
Leukocytes,Ua: NEGATIVE
Nitrite: NEGATIVE
Protein, ur: 30 mg/dL — AB
Specific Gravity, Urine: 1.023 (ref 1.005–1.030)
WBC, UA: NONE SEEN WBC/hpf (ref 0–5)
pH: 6 (ref 5.0–8.0)

## 2020-11-16 LAB — BASIC METABOLIC PANEL
Anion gap: 18 — ABNORMAL HIGH (ref 5–15)
Anion gap: 12 (ref 5–15)
BUN: 36 mg/dL — ABNORMAL HIGH (ref 6–20)
BUN: 32 mg/dL — ABNORMAL HIGH (ref 6–20)
CO2: 11 mmol/L — ABNORMAL LOW (ref 22–32)
CO2: 17 mmol/L — ABNORMAL LOW (ref 22–32)
Calcium: 9.3 mg/dL (ref 8.9–10.3)
Calcium: 8.7 mg/dL — ABNORMAL LOW (ref 8.9–10.3)
Chloride: 100 mmol/L (ref 98–111)
Chloride: 103 mmol/L (ref 98–111)
Creatinine, Ser: 2.4 mg/dL — ABNORMAL HIGH (ref 0.44–1.00)
Creatinine, Ser: 1.95 mg/dL — ABNORMAL HIGH (ref 0.44–1.00)
GFR, Estimated: 23 mL/min — ABNORMAL LOW (ref >60)
GFR, Estimated: 29 mL/min — ABNORMAL LOW (ref >60)
Glucose, Bld: 476 mg/dL — ABNORMAL HIGH (ref 70–99)
Glucose, Bld: 367 mg/dL — ABNORMAL HIGH (ref 70–99)
Potassium: 4.5 mmol/L (ref 3.5–5.1)
Potassium: 3.8 mmol/L (ref 3.5–5.1)
Sodium: 129 mmol/L — ABNORMAL LOW (ref 135–145)
Sodium: 132 mmol/L — ABNORMAL LOW (ref 135–145)

## 2020-11-16 LAB — CBC
HCT: 40.3 % (ref 36.0–46.0)
HCT: 34.1 % — ABNORMAL LOW (ref 36.0–46.0)
Hemoglobin: 14.6 g/dL (ref 12.0–15.0)
Hemoglobin: 12.1 g/dL (ref 12.0–15.0)
MCH: 31.1 pg (ref 26.0–34.0)
MCH: 30.9 pg (ref 26.0–34.0)
MCHC: 36.2 g/dL — ABNORMAL HIGH (ref 30.0–36.0)
MCHC: 35.5 g/dL (ref 30.0–36.0)
MCV: 85.7 fL (ref 80.0–100.0)
MCV: 87.2 fL (ref 80.0–100.0)
Platelets: 269 10*3/uL (ref 150–400)
Platelets: 246 10*3/uL (ref 150–400)
RBC: 4.7 MIL/uL (ref 3.87–5.11)
RBC: 3.91 MIL/uL (ref 3.87–5.11)
RDW: 11.8 % (ref 11.5–15.5)
RDW: 11.9 % (ref 11.5–15.5)
WBC: 13.5 10*3/uL — ABNORMAL HIGH (ref 4.0–10.5)
WBC: 8.9 10*3/uL (ref 4.0–10.5)
nRBC: 0 % (ref 0.0–0.2)
nRBC: 0 % (ref 0.0–0.2)

## 2020-11-16 LAB — COMPREHENSIVE METABOLIC PANEL
ALT: 12 U/L (ref 0–44)
ALT: 11 U/L (ref 0–44)
AST: 10 U/L — ABNORMAL LOW (ref 15–41)
AST: 19 U/L (ref 15–41)
Albumin: 4.1 g/dL (ref 3.5–5.0)
Albumin: 3.6 g/dL (ref 3.5–5.0)
Alkaline Phosphatase: 88 U/L (ref 38–126)
Alkaline Phosphatase: 88 U/L (ref 38–126)
Anion gap: 20 — ABNORMAL HIGH (ref 5–15)
Anion gap: 10 (ref 5–15)
BUN: 39 mg/dL — ABNORMAL HIGH (ref 6–20)
BUN: 27 mg/dL — ABNORMAL HIGH (ref 6–20)
CO2: 13 mmol/L — ABNORMAL LOW (ref 22–32)
CO2: 20 mmol/L — ABNORMAL LOW (ref 22–32)
Calcium: 8.8 mg/dL — ABNORMAL LOW (ref 8.9–10.3)
Calcium: 9.1 mg/dL (ref 8.9–10.3)
Chloride: 93 mmol/L — ABNORMAL LOW (ref 98–111)
Chloride: 104 mmol/L (ref 98–111)
Creatinine, Ser: 2.79 mg/dL — ABNORMAL HIGH (ref 0.44–1.00)
Creatinine, Ser: 1.52 mg/dL — ABNORMAL HIGH (ref 0.44–1.00)
GFR, Estimated: 19 mL/min — ABNORMAL LOW (ref >60)
GFR, Estimated: 39 mL/min — ABNORMAL LOW (ref >60)
Glucose, Bld: 878 mg/dL (ref 70–99)
Glucose, Bld: 168 mg/dL — ABNORMAL HIGH (ref 70–99)
Potassium: 5.1 mmol/L (ref 3.5–5.1)
Potassium: 3.2 mmol/L — ABNORMAL LOW (ref 3.5–5.1)
Sodium: 126 mmol/L — ABNORMAL LOW (ref 135–145)
Sodium: 134 mmol/L — ABNORMAL LOW (ref 135–145)
Total Bilirubin: 1.5 mg/dL — ABNORMAL HIGH (ref 0.3–1.2)
Total Bilirubin: 0.6 mg/dL (ref 0.3–1.2)
Total Protein: 7.6 g/dL (ref 6.5–8.1)
Total Protein: 6.6 g/dL (ref 6.5–8.1)

## 2020-11-16 LAB — GLUCOSE, CAPILLARY
Glucose-Capillary: 145 mg/dL — ABNORMAL HIGH (ref 70–99)
Glucose-Capillary: 158 mg/dL — ABNORMAL HIGH (ref 70–99)
Glucose-Capillary: 158 mg/dL — ABNORMAL HIGH (ref 70–99)
Glucose-Capillary: 160 mg/dL — ABNORMAL HIGH (ref 70–99)
Glucose-Capillary: 171 mg/dL — ABNORMAL HIGH (ref 70–99)
Glucose-Capillary: 178 mg/dL — ABNORMAL HIGH (ref 70–99)
Glucose-Capillary: 219 mg/dL — ABNORMAL HIGH (ref 70–99)
Glucose-Capillary: 224 mg/dL — ABNORMAL HIGH (ref 70–99)
Glucose-Capillary: 279 mg/dL — ABNORMAL HIGH (ref 70–99)
Glucose-Capillary: 293 mg/dL — ABNORMAL HIGH (ref 70–99)
Glucose-Capillary: 349 mg/dL — ABNORMAL HIGH (ref 70–99)
Glucose-Capillary: 384 mg/dL — ABNORMAL HIGH (ref 70–99)
Glucose-Capillary: 486 mg/dL — ABNORMAL HIGH (ref 70–99)
Glucose-Capillary: 572 mg/dL (ref 70–99)

## 2020-11-16 LAB — HEMOGLOBIN A1C
Hgb A1c MFr Bld: 14.3 % — ABNORMAL HIGH (ref 4.8–5.6)
Mean Plasma Glucose: 363.71 mg/dL

## 2020-11-16 LAB — TROPONIN I (HIGH SENSITIVITY)
Troponin I (High Sensitivity): 4 ng/L (ref <18)
Troponin I (High Sensitivity): 5 ng/L (ref <18)

## 2020-11-16 LAB — MAGNESIUM
Magnesium: 2.4 mg/dL (ref 1.7–2.4)
Magnesium: 1.8 mg/dL (ref 1.7–2.4)

## 2020-11-16 LAB — CBG MONITORING, ED
Glucose-Capillary: >600 mg/dL (ref 70–99)
Glucose-Capillary: >600 mg/dL (ref 70–99)
Glucose-Capillary: >600 mg/dL (ref 70–99)

## 2020-11-16 LAB — PHOSPHORUS: Phosphorus: 2.6 mg/dL (ref 2.5–4.6)

## 2020-11-16 LAB — RESP PANEL BY RT-PCR (FLU A&B, COVID) ARPGX2
Influenza A by PCR: NEGATIVE
Influenza B by PCR: NEGATIVE
SARS Coronavirus 2 by RT PCR: NEGATIVE

## 2020-11-16 LAB — BETA-HYDROXYBUTYRIC ACID
Beta-Hydroxybutyric Acid: 3.77 mmol/L — ABNORMAL HIGH (ref 0.05–0.27)
Beta-Hydroxybutyric Acid: 3.6 mmol/L — ABNORMAL HIGH (ref 0.05–0.27)

## 2020-11-16 LAB — PROTIME-INR
INR: 1.1 (ref 0.8–1.2)
Prothrombin Time: 13.7 seconds (ref 11.4–15.2)

## 2020-11-16 LAB — APTT: aPTT: 22 seconds — ABNORMAL LOW (ref 24–36)

## 2020-11-16 MED ORDER — GABAPENTIN 100 MG PO CAPS
100.0000 mg | ORAL_CAPSULE | Freq: Two times a day (BID) | ORAL | Status: DC
  Administered 2020-11-16 – 2020-11-18 (×5): 100 mg via ORAL
  Filled 2020-11-16 (×5): qty 1

## 2020-11-16 MED ORDER — ACETAMINOPHEN 650 MG RE SUPP: 650.0000 mg | Freq: Four times a day (QID) | RECTAL | Status: DC | PRN

## 2020-11-16 MED ORDER — ADULT MULTIVITAMIN W/MINERALS CH
1.0000 | ORAL_TABLET | Freq: Every day | ORAL | Status: DC
  Administered 2020-11-16 – 2020-11-18 (×3): 1 via ORAL
  Filled 2020-11-16 (×3): qty 1

## 2020-11-16 MED ORDER — CHLORHEXIDINE GLUCONATE CLOTH 2 % EX PADS
6.0000 | MEDICATED_PAD | Freq: Every day | CUTANEOUS | Status: DC
  Administered 2020-11-16 – 2020-11-17 (×2): 6 via TOPICAL

## 2020-11-16 MED ORDER — THIAMINE HCL 100 MG/ML IJ SOLN: 100.0000 mg | Freq: Every day | INTRAMUSCULAR | Status: DC

## 2020-11-16 MED ORDER — DEXTROSE 50 % IV SOLN: 0.0000 mL | INTRAVENOUS | Status: DC | PRN

## 2020-11-16 MED ORDER — DILTIAZEM HCL ER COATED BEADS 240 MG PO CP24
240.0000 mg | ORAL_CAPSULE | Freq: Every day | ORAL | Status: DC
  Administered 2020-11-16 – 2020-11-17 (×2): 240 mg via ORAL
  Filled 2020-11-16 (×2): qty 1

## 2020-11-16 MED ORDER — BUPROPION HCL ER (SR) 150 MG PO TB12
150.0000 mg | ORAL_TABLET | Freq: Two times a day (BID) | ORAL | Status: DC
  Administered 2020-11-16 – 2020-11-18 (×5): 150 mg via ORAL
  Filled 2020-11-16 (×5): qty 1

## 2020-11-16 MED ORDER — THIAMINE HCL 100 MG PO TABS
100.0000 mg | ORAL_TABLET | Freq: Every day | ORAL | Status: DC
  Administered 2020-11-16: 100 mg via ORAL
  Filled 2020-11-16: qty 1

## 2020-11-16 MED ORDER — TRAZODONE HCL 50 MG PO TABS: 25.0000 mg | ORAL_TABLET | Freq: Every evening | ORAL | Status: DC | PRN

## 2020-11-16 MED ORDER — LACTATED RINGERS IV BOLUS: 20.0000 mL/kg | Freq: Once | INTRAVENOUS | Status: DC

## 2020-11-16 MED ORDER — INSULIN REGULAR(HUMAN) IN NACL 100-0.9 UT/100ML-% IV SOLN: INTRAVENOUS | Status: DC

## 2020-11-16 MED ORDER — INSULIN GLARGINE 100 UNIT/ML ~~LOC~~ SOLN
24.0000 [IU] | SUBCUTANEOUS | Status: DC
  Administered 2020-11-16: 24 [IU] via SUBCUTANEOUS
  Filled 2020-11-16: qty 0.24

## 2020-11-16 MED ORDER — ENOXAPARIN SODIUM 40 MG/0.4ML IJ SOSY
40.0000 mg | PREFILLED_SYRINGE | INTRAMUSCULAR | Status: DC
  Administered 2020-11-16 – 2020-11-18 (×3): 40 mg via SUBCUTANEOUS
  Filled 2020-11-16 (×3): qty 0.4

## 2020-11-16 MED ORDER — THIAMINE HCL 100 MG PO TABS: 100.0000 mg | ORAL_TABLET | Freq: Every day | ORAL | Status: DC

## 2020-11-16 MED ORDER — ONDANSETRON HCL 4 MG PO TABS: 4.0000 mg | ORAL_TABLET | Freq: Four times a day (QID) | ORAL | Status: DC | PRN

## 2020-11-16 MED ORDER — FOLIC ACID 1 MG PO TABS: 1.0000 mg | ORAL_TABLET | Freq: Every day | ORAL | Status: DC

## 2020-11-16 MED ORDER — LACTATED RINGERS IV SOLN: INTRAVENOUS | Status: DC

## 2020-11-16 MED ORDER — LACTATED RINGERS IV BOLUS
1000.0000 mL | Freq: Once | INTRAVENOUS | Status: AC
  Administered 2020-11-16: 1000 mL via INTRAVENOUS

## 2020-11-16 MED ORDER — DEXTROSE IN LACTATED RINGERS 5 % IV SOLN: INTRAVENOUS | Status: DC

## 2020-11-16 MED ORDER — LORAZEPAM 2 MG/ML IJ SOLN
1.0000 mg | INTRAMUSCULAR | Status: DC | PRN
  Administered 2020-11-16: 1 mg via INTRAVENOUS
  Filled 2020-11-16: qty 1

## 2020-11-16 MED ORDER — INSULIN ASPART 100 UNIT/ML IJ SOLN
0.0000 [IU] | INTRAMUSCULAR | Status: DC
  Administered 2020-11-16: 7 [IU] via SUBCUTANEOUS
  Administered 2020-11-16: 11 [IU] via SUBCUTANEOUS
  Administered 2020-11-17: 15 [IU] via SUBCUTANEOUS
  Administered 2020-11-17: 3 [IU] via SUBCUTANEOUS
  Administered 2020-11-17: 20 [IU] via SUBCUTANEOUS
  Administered 2020-11-17: 11 [IU] via SUBCUTANEOUS
  Administered 2020-11-17: 4 [IU] via SUBCUTANEOUS
  Administered 2020-11-18 (×2): 7 [IU] via SUBCUTANEOUS
  Administered 2020-11-18: 3 [IU] via SUBCUTANEOUS
  Filled 2020-11-16 (×11): qty 1

## 2020-11-16 MED ORDER — ATORVASTATIN CALCIUM 20 MG PO TABS
40.0000 mg | ORAL_TABLET | Freq: Every day | ORAL | Status: DC
  Administered 2020-11-16 – 2020-11-17 (×2): 40 mg via ORAL
  Filled 2020-11-16 (×2): qty 2

## 2020-11-16 MED ORDER — ONDANSETRON HCL 4 MG/2ML IJ SOLN
4.0000 mg | Freq: Four times a day (QID) | INTRAMUSCULAR | Status: DC | PRN
  Administered 2020-11-16: 4 mg via INTRAVENOUS
  Filled 2020-11-16: qty 2

## 2020-11-16 MED ORDER — LORAZEPAM 1 MG PO TABS: 1.0000 mg | ORAL_TABLET | ORAL | Status: DC | PRN

## 2020-11-16 MED ORDER — NICOTINE 21 MG/24HR TD PT24
21.0000 mg | MEDICATED_PATCH | Freq: Every day | TRANSDERMAL | Status: DC
  Administered 2020-11-16 – 2020-11-18 (×2): 21 mg via TRANSDERMAL
  Filled 2020-11-16 (×3): qty 1

## 2020-11-16 MED ORDER — FOLIC ACID 1 MG PO TABS
1.0000 mg | ORAL_TABLET | Freq: Every day | ORAL | Status: DC
  Administered 2020-11-16: 1 mg via ORAL
  Filled 2020-11-16: qty 1

## 2020-11-16 MED ORDER — MAGNESIUM HYDROXIDE 400 MG/5ML PO SUSP: 30.0000 mL | Freq: Every day | ORAL | Status: DC | PRN

## 2020-11-16 MED ORDER — ADULT MULTIVITAMIN W/MINERALS CH: 1.0000 | ORAL_TABLET | Freq: Every day | ORAL | Status: DC

## 2020-11-16 MED ORDER — ACETAMINOPHEN 325 MG PO TABS
650.0000 mg | ORAL_TABLET | Freq: Four times a day (QID) | ORAL | Status: DC | PRN
  Administered 2020-11-17 – 2020-11-18 (×2): 650 mg via ORAL
  Filled 2020-11-16 (×2): qty 2

## 2020-11-16 MED ORDER — SODIUM CHLORIDE 0.9 % IV SOLN: INTRAVENOUS | Status: DC

## 2020-11-16 MED ORDER — MIDODRINE HCL 5 MG PO TABS
10.0000 mg | ORAL_TABLET | Freq: Three times a day (TID) | ORAL | Status: DC
  Administered 2020-11-16 – 2020-11-17 (×5): 10 mg via ORAL
  Filled 2020-11-16 (×6): qty 2

## 2020-11-16 MED ORDER — SODIUM CHLORIDE 0.9 % IV BOLUS
500.0000 mL | Freq: Once | INTRAVENOUS | Status: AC
  Administered 2020-11-16: 500 mL via INTRAVENOUS

## 2020-11-16 MED ORDER — INSULIN REGULAR(HUMAN) IN NACL 100-0.9 UT/100ML-% IV SOLN
INTRAVENOUS | Status: DC
  Administered 2020-11-16: 10 [IU]/h via INTRAVENOUS
  Filled 2020-11-16: qty 100

## 2020-11-16 MED ORDER — CYANOCOBALAMIN 500 MCG PO TABS
500.0000 ug | ORAL_TABLET | Freq: Every day | ORAL | Status: DC
  Administered 2020-11-16 – 2020-11-18 (×3): 500 ug via ORAL
  Filled 2020-11-16 (×3): qty 1

## 2020-11-16 MED ORDER — FOLIC ACID 1 MG PO TABS
1.0000 mg | ORAL_TABLET | Freq: Every day | ORAL | Status: DC
  Administered 2020-11-17 – 2020-11-18 (×2): 1 mg via ORAL
  Filled 2020-11-16 (×2): qty 1

## 2020-11-16 MED ORDER — THIAMINE HCL 100 MG PO TABS
100.0000 mg | ORAL_TABLET | Freq: Every day | ORAL | Status: DC
  Administered 2020-11-17 – 2020-11-18 (×2): 100 mg via ORAL
  Filled 2020-11-16 (×2): qty 1

## 2020-11-16 MED ORDER — PANTOPRAZOLE SODIUM 40 MG PO TBEC
40.0000 mg | DELAYED_RELEASE_TABLET | Freq: Every day | ORAL | Status: DC
  Administered 2020-11-16 – 2020-11-18 (×3): 40 mg via ORAL
  Filled 2020-11-16 (×3): qty 1

## 2020-11-16 MED ORDER — ALBUTEROL SULFATE HFA 108 (90 BASE) MCG/ACT IN AERS
2.0000 | INHALATION_SPRAY | Freq: Four times a day (QID) | RESPIRATORY_TRACT | Status: DC | PRN
  Filled 2020-11-16: qty 6.7

## 2020-11-16 NOTE — H&P (Signed)
[place]   PATIENT NAME: Pamela Lane    MR#:  [id number]  DATE OF BIRTH:  [birth year]  DATE OF ADMISSION:  11/15/2020  PRIMARY CARE PHYSICIAN: Pcp, No   Patient is coming from: Home  REQUESTING/REFERRING PHYSICIAN: Loleta Rose, MD CHIEF COMPLAINT:   Chief Complaint  Patient presents with  . Abdominal Pain    HISTORY OF PRESENT ILLNESS:  Pamela Lane is a 60 y.o. African-American female with medical history significant for asthma, DM2, hypertension and C. difficile colitis, presented to the emergency room with acute onset of abdominal pain.  She admits to polyuria and polydipsia with elevated blood glucose levels.  She denied any nausea or vomiting.  No melena or bright red bleeding per rectum.  No cough or wheezing or dyspnea.  No chest pain or palpitations. ED Course: When she came to the ER vital signs were within normal.  Labs revealed VBG with pH 7.19 bicarbonate 13 and BMP showed hyponatremia and hypochloremia with a CO2 of 13, blood glucose of 878, BUN of 39 creatinine 2.79 with total bili 1.5.  High-sensitivity troponin I was 5 and later 4.  Lactic acid was 2.2.  CBC showed leukocytosis 12.9 with neutrophilia.  INR 1.1 PT 13.7 and PTT 22.  UA showed more than 500 glucose.  Influenza antigens and COVID-19 PCR came back negative.  Portable chest ray showed no acute cardiopulmonary disease.  Blood and urine culture was sent. EKG as reviewed by me : Showed normal sinus rhythm with a rate of 96 with right atrial enlargement and Q waves anteroseptally with prolonged QT interval with QTC of 500 for EMS.Sinus Imaging: Chest x-ray showed no acute pulmonary disease.  The patient was given 2 L bolus of IV lactated Ringer and was on IV insulin drip per Endo tool.  For further evaluation and management. PAST MEDICAL HISTORY:   Past Medical History:  Diagnosis Date  . Asthma   . Clostridium difficile colitis   . Diabetes mellitus without complication (HCC)   .  Hypertension   . Mental disorder    pt reports 'I have all of them mental disorders'    PAST SURGICAL HISTORY:   Past Surgical History:  Procedure Laterality Date  . ABDOMINAL HYSTERECTOMY      SOCIAL HISTORY:   Social History   Tobacco Use  . Smoking status: Current Every Day Smoker    Packs/day: 0.25    Types: Cigarettes  . Smokeless tobacco: Never Used  Substance Use Topics  . Alcohol use: Yes    Alcohol/week: 40.0 standard drinks    Types: 40 Cans of beer per week    Comment: 3 big bottles of liquor    FAMILY HISTORY:   Family History  Problem Relation Age of Onset  . Hypertension Mother     DRUG ALLERGIES:  No Known Allergies  REVIEW OF SYSTEMS:   ROS As per history of present illness. All pertinent systems were reviewed above. Constitutional, HEENT, cardiovascular, respiratory, GI, GU, musculoskeletal, neuro, psychiatric, endocrine, integumentary and hematologic systems were reviewed and are otherwise negative/unremarkable except for positive findings mentioned above in the HPI.   MEDICATIONS AT HOME:   Prior to Admission medications   Medication Sig Start Date End Date Taking? Authorizing Provider  albuterol (PROVENTIL HFA;VENTOLIN HFA) 108 (90 Base) MCG/ACT inhaler Inhale 2 puffs into the lungs every 6 (six) hours as needed for wheezing or shortness of breath. 02/18/18  Yes Auburn Bilberry, MD  atorvastatin (LIPITOR) 40 MG  tablet TAKE 1 TABLET BY MOUTH AT BEDTIME. 08/29/20 08/29/21 Yes Hall, Carole N, DO  buPROPion [facility](WELLBUTRIN SR) 150 MG 12 hr tablet Take 150 mg by mouth 2 (two) times daily. 10/01/20  Yes [provider]  diltiazem (CARDIZEM CD) 240 MG 24 hr capsule Take 1 capsule (240 mg total) by mouth daily. 10/12/20  Yes Wieting, Richard, MD  folic acid (FOLVITE) 1 MG tablet Take 1 tablet (1 mg total) by mouth daily. 03/08/18  Yes Gouru, Aruna, MD  gabapentin (NEURONTIN) 100 MG capsule TAKE 1 CAPSULE BY MOUTH 2 TIMES DAILY. 08/29/20 08/29/21 Yes Hall,  Carole N, DO  insulin glargine (LANTUS) 100 UNIT/ML Solostar Pen Inject 30 Units into the skin daily. 10/11/20 01/09/21 Yes Wieting, Richard, MD  lisinopril (ZESTRIL) 10 MG tablet Take 10 mg by mouth daily. 10/01/20  Yes [provider]  metFORMIN (GLUCOPHAGE-XR) 500 MG 24 hr tablet TAKE 2 TABLETS (1,000 MG TOTAL) BY MOUTH DAILY WITH BREAKFAST. 08/29/20 08/29/21 Yes Darlin DropHall, Carole N, DO  Multiple Vitamin (MULTIVITAMIN WITH MINERALS) TABS tablet Take 1 tablet by mouth daily. 03/08/18  Yes Gouru, Aruna, MD  NOVOLOG FLEXPEN 100 UNIT/ML FlexPen INJECT 10 UNITS INTO THE SKIN 3 TIMES DAILY WITH MEALS. 08/29/20 08/29/21 Yes Hall, Carole N, DO  omeprazole (PRILOSEC) 20 MG capsule Take 1 capsule by mouth daily. 10/11/20  Yes [provider]  thiamine 100 MG tablet Take 1 tablet (100 mg total) by mouth daily. 10/11/20  Yes Wieting, Richard, MD  vitamin B-12 (CYANOCOBALAMIN) 500 MCG tablet Take 500 mcg by mouth daily.   Yes [provider]      VITAL SIGNS:  Blood pressure 125/61, pulse 90, temperature 98 F (36.7 C), temperature source Oral, resp. rate 17, height 5' 2.99" (1.6 m), weight 79.9 kg, SpO2 96 %.  PHYSICAL EXAMINATION:  Physical Exam  GENERAL:  60 y.o.-year-old African-American female patient lying in the bed with no acute distress.  EYES: Pupils equal, round, reactive to light and accommodation. No scleral icterus. Extraocular muscles intact.  HEENT: Head atraumatic, normocephalic. Oropharynx with dry mucous membrane and tongue and nasopharynx clear.  NECK:  Supple, no jugular venous distention. No thyroid enlargement, no tenderness.  LUNGS: Normal breath sounds bilaterally, no wheezing, rales,rhonchi or crepitation. No use of accessory muscles of respiration.  CARDIOVASCULAR: Regular rate and rhythm, S1, S2 normal. No murmurs, rubs, or gallops.  ABDOMEN: Soft, nondistended, nontender. Bowel sounds present. No organomegaly or mass.  EXTREMITIES: No pedal edema, cyanosis, or  clubbing.  NEUROLOGIC: Cranial nerves II through XII are intact. Muscle strength 5/5 in all extremities. Sensation intact. Gait not checked.  PSYCHIATRIC: The patient is alert and oriented x 3.  Normal affect and good eye contact. SKIN: No obvious rash, lesion, or ulcer.   LABORATORY PANEL:   CBC Recent Labs  Lab 11/15/20 2318  WBC 12.9*  HGB 15.2*  HCT 44.6  PLT 311   ------------------------------------------------------------------------------------------------------------------  Chemistries  Recent Labs  Lab 11/15/20 2318  NA 126*  K 5.1  CL 93*  CO2 13*  GLUCOSE 878*  BUN 39*  CREATININE 2.79*  CALCIUM 8.8*  MG 2.4  AST 10*  ALT 12  ALKPHOS 88  BILITOT 1.5*   ------------------------------------------------------------------------------------------------------------------  Cardiac Enzymes No results for input(s): TROPONINI in the last 168 hours. ------------------------------------------------------------------------------------------------------------------  RADIOLOGY:  CT ABDOMEN PELVIS WO CONTRAST  Result Date: 11/16/2020 CLINICAL DATA:  60 year old female with abdominal pain. EXAM: CT ABDOMEN AND PELVIS WITHOUT CONTRAST TECHNIQUE: Multidetector CT imaging of the abdomen and pelvis  was performed following the standard protocol without IV contrast. COMPARISON:  CT abdomen pelvis dated 12/28/2018. FINDINGS: Evaluation of this exam is limited in the absence of intravenous contrast as well as due to respiratory motion artifact. Lower chest: The visualized lung bases are clear. There is coronary vascular calcification. No intra-abdominal free air or free fluid. Hepatobiliary: No focal liver abnormality is seen. No gallstones, gallbladder wall thickening, or biliary dilatation. Pancreas: Unremarkable. No pancreatic ductal dilatation or surrounding inflammatory changes. Spleen: Normal in size without focal abnormality. Adrenals/Urinary Tract: The adrenal glands  unremarkable. The kidneys, visualized ureters, and urinary bladder appear unremarkable. Stomach/Bowel: There is no bowel obstruction or active inflammation. The appendix is normal. Vascular/Lymphatic: Moderate aortoiliac atherosclerotic disease. The IVC is unremarkable. No portal venous gas. There is no adenopathy. Reproductive: Hysterectomy.  No adnexal masses. Other: None Musculoskeletal: Degenerative changes of the spine. No acute osseous pathology. IMPRESSION: 1. No acute intra-abdominal or pelvic pathology. No bowel obstruction. Normal appendix. 2. Aortic Atherosclerosis (ICD10-I70.0). Electronically Signed   By: Elgie Collard M.D.   On: 11/16/2020 01:31   DG Chest Port 1 View  Result Date: 11/15/2020 CLINICAL DATA:  Possible sepsis EXAM: PORTABLE CHEST 1 VIEW COMPARISON:  10/08/2020 FINDINGS: The heart size and mediastinal contours are within normal limits. Both lungs are clear. The visualized skeletal structures are unremarkable. IMPRESSION: No active disease. Electronically Signed   By: Alcide Clever M.D.   On: 11/15/2020 23:39      IMPRESSION AND PLAN:  Active Problems:   DKA (diabetic ketoacidosis) (HCC)  1.  DKA with uncontrolled type 2 diabetes mellitus. - The patient will be admitted to a stepdown unit bed. - We will continue DKA Endo tool IV insulin protocol. - She will be aggressively hydrated and will follow serial BMPs.  2.  Acute kidney injury likely secondary to volume depletion and dehydration. - The patient will be hydrated with IV fluids and will follow her BMP. - Nephrotoxins will be avoided.  3.  Dyslipidemia. - We will continue statin therapy  4.  Depression. - We will continue butyrin XL.  5.  Vitamin B12 deficiency. - We will continue vitamin B12.  6.  Peripheral neuropathy. - We will continue Neurontin.  7.  Hypertension. - We will continue Cardizem CD.  DVT prophylaxis: Lovenox.   Code Status: full code.   Family Communication:  The plan of care  was discussed in details with the patient (and family). I answered all questions. The patient agreed to proceed with the above mentioned plan. Further management will depend upon hospital course. Disposition Plan: Back to previous home environment Consults called: none. All the records are reviewed and case discussed with ED provider.  Status is: Inpatient  Remains inpatient appropriate because:Ongoing active pain requiring inpatient pain management, Ongoing diagnostic testing needed not appropriate for outpatient work up, Unsafe d/c plan, IV treatments appropriate due to intensity of illness or inability to take PO and Inpatient level of care appropriate due to severity of illness   Dispo: The patient is from: Home              Anticipated d/c is to: Home              Patient currently is not medically stable to d/c.   Difficult to place patient No   TOTAL TIME TAKING CARE OF THIS PATIENT: 55 minutes.    Hannah Beat M.D on 11/16/2020 at 2:34 AM  [facility]   From 7 PM-7 AM, contact  night-coverage [email or web address]  CC: Primary care physician; Pcp, No

## 2020-11-16 NOTE — ED Notes (Signed)
Per endotool, start insulin infusion at 10 units/hr

## 2020-11-16 NOTE — ED Notes (Signed)
Per EndoTool, continue insulin infusion at 10 units/hr, CBG reads as HI on meter

## 2020-11-16 NOTE — Progress Notes (Signed)
Insulin drip & D5 stopped at shift change with previous nurse at bedside

## 2020-11-16 NOTE — ED Notes (Signed)
Report given to [place], RN in CCU, pending pt transport at this time

## 2020-11-16 NOTE — Progress Notes (Signed)
Brief hospitalist update note.  This is a nonbillable note.  Please see same-day H&P from Dr. Arville Care for full billable details.  Briefly, this is a 60 year old female who presented to the emergency department with cute onset of abdominal pain associated with polyuria and polydipsia.  Had markedly elevated blood glucose.  Was found to be in diabetic ketoacidosis.  Admitted to the hospitalist service under stepdown status.  Started on insulin gtt. via Endo tool protocol.  As of this morning the patient remains acidotic.  Her symptoms are markedly improved.  Will continue insulin GTT via Endo tool protocol until closure of anion gap.  Once BMP demonstrates resolution of acidosis will initiate transition to subcutaneous regimen and transfer out of ICU and advance diet at the time.  Suspect that will be later this afternoon.  Lolita Patella MD

## 2020-11-16 NOTE — Progress Notes (Addendum)
Progressing off Insulin drip. Extremely non compliant patient that is upset she was not discharged today. Cigarettes, and candy found in belongings bag but she states "I didn't put it in my belongings. " refuses to hold arm still so IV's will run.  Fussed at nurse stating she was tired of holding her arm straight. Patient rude and demanding. Started on CIWA due due history of ETOH.

## 2020-11-16 NOTE — Plan of Care (Signed)
Patient is alert and oriented x 4. Patient states she is not in any pain. Patient becomes agitated easily. Patient states that she is tired of being poked for blood. Explained to patient that we are only trying to help her and that once her sugar is at low enough level then the hourly finger sticks will start to slow down. Patient stated that she was thirsty and wanted something to drink. I explained to the patient that she is NPO, patient became upset. I secure chatted Dr. Arville Care and asked if it was okay to give the patient a small cup of ice. Dr. Arville Care said yes to giving the patient a cup of ice. Patient was content with that. Will continue patient care.   Problem: Education: Goal: Knowledge of General Education information will improve Description: Including pain rating scale, medication(s)/side effects and non-pharmacologic comfort measures Outcome: Progressing  Problem: Clinical Measurements: Goal: Will remain free from infection Outcome: Progressing Goal: Diagnostic test results will improve Outcome: Progressing Goal: Respiratory complications will improve Outcome: Progressing Goal: Cardiovascular complication will be avoided Outcome: Progressing   Problem: Activity: Goal: Risk for activity intolerance will decrease Outcome: Progressing   Problem: Coping: Goal: Level of anxiety will decrease Outcome: Progressing   Problem: Elimination: Goal: Will not experience complications related to bowel motility Outcome: Progressing Goal: Will not experience complications related to urinary retention Outcome: Progressing   Problem: Pain Managment: Goal: General experience of comfort will improve Outcome: Progressing   Problem: Safety: Goal: Ability to remain free from injury will improve Outcome: Progressing   Problem: Skin Integrity: Goal: Risk for impaired skin integrity will decrease Outcome: Progressing   Problem: Health Behavior/Discharge Planning: Goal: Ability to manage  health-related needs will improve Outcome: Not Progressing   Problem: Clinical Measurements: Goal: Ability to maintain clinical measurements within normal limits will improve Outcome: Not Progressing   Problem: Nutrition: Goal: Adequate nutrition will be maintained Outcome: Not Progressing

## 2020-11-16 NOTE — Progress Notes (Signed)
Inpatient Diabetes Program Recommendations  AACE/ADA: New Consensus Statement on Inpatient Glycemic Control   Target Ranges:  Prepandial:   less than 140 mg/dL      Peak postprandial:   less than 180 mg/dL (1-2 hours)      Critically ill patients:  140 - 180 mg/dL   Results for Pamela Lane, Pamela Lane (MRN [id number]) as of 11/16/2020 11:16  Ref. Range 11/16/2020 01:24 11/16/2020 02:01 11/16/2020 02:31 11/16/2020 03:00 11/16/2020 04:18 11/16/2020 05:19 11/16/2020 06:29 11/16/2020 07:37 11/16/2020 11:12  Glucose-Capillary Latest Ref Range: 70 - 99 mg/dL >559 (HH) >741 (HH) >638 (HH) 572 (HH) 486 (H) 349 (H) 158 (H) 178 (H) 384 (H)  Results for [name], [name] (MRN [id number]) as of 11/16/2020 11:16  Ref. Range 11/15/2020 23:18 11/16/2020 03:52  Beta-Hydroxybutyric Acid Latest Ref Range: 0.05 - 0.27 mmol/L  3.77 (H)  Glucose Latest Ref Range: 70 - 99 mg/dL 212 (HH) 248 (H)   Review of Glycemic Control  Diabetes history: DM2 Outpatient Diabetes medications: Lantus 30 units daily, Novolog 10 units TID with meals, Metformin XR 1000 mg QAM Current orders for Inpatient glycemic control: IV insulin  Inpatient Diabetes Program Recommendations:   Insulin: Once acidosis is resolved and provider is ready to transition from IV to SQ insulin, please consider ordering Lantus 24 units Q24H, CBGs Q4H, Novolog 0-20 units Q4H, and if diet advanced Novolog 5 units TID with meals for meal coverage if patient eats at least 50% of meals.  NOTE: Noted consult for Diabetes Coordinator. Diabetes Coordinator is not on campus over the weekend but available by pager from 8am to 5pm for questions or concerns. Chart reviewed. Patient was recently inpatient 10/07/20-10/11/20 and was seen by inpatient diabetes coordinator on 10/10/20. Patient admitted with today with DKA with initial glucose of 878 mg/dl and was started on IV insulin.   Thanks, Orlando Penner, RN, MSN, CDE Diabetes Coordinator Inpatient Diabetes Program [phone] (Team Pager from 8am  to 5pm)

## 2020-11-16 NOTE — ED Notes (Signed)
Pt has difficult vascular access, ED MD made aware, attempting USGIV at this time

## 2020-11-17 LAB — BASIC METABOLIC PANEL
Anion gap: 7 (ref 5–15)
BUN: 22 mg/dL — ABNORMAL HIGH (ref 6–20)
CO2: 23 mmol/L (ref 22–32)
Calcium: 8.9 mg/dL (ref 8.9–10.3)
Chloride: 104 mmol/L (ref 98–111)
Creatinine, Ser: 1.35 mg/dL — ABNORMAL HIGH (ref 0.44–1.00)
GFR, Estimated: 45 mL/min — ABNORMAL LOW (ref >60)
Glucose, Bld: 126 mg/dL — ABNORMAL HIGH (ref 70–99)
Potassium: 3 mmol/L — ABNORMAL LOW (ref 3.5–5.1)
Sodium: 134 mmol/L — ABNORMAL LOW (ref 135–145)

## 2020-11-17 LAB — GLUCOSE, CAPILLARY
Glucose-Capillary: 128 mg/dL — ABNORMAL HIGH (ref 70–99)
Glucose-Capillary: 132 mg/dL — ABNORMAL HIGH (ref 70–99)
Glucose-Capillary: 193 mg/dL — ABNORMAL HIGH (ref 70–99)
Glucose-Capillary: 279 mg/dL — ABNORMAL HIGH (ref 70–99)
Glucose-Capillary: 334 mg/dL — ABNORMAL HIGH (ref 70–99)
Glucose-Capillary: 401 mg/dL — ABNORMAL HIGH (ref 70–99)

## 2020-11-17 LAB — URINE CULTURE

## 2020-11-17 LAB — MAGNESIUM: Magnesium: 2 mg/dL (ref 1.7–2.4)

## 2020-11-17 MED ORDER — INSULIN ASPART 100 UNIT/ML IJ SOLN
6.0000 [IU] | Freq: Three times a day (TID) | INTRAMUSCULAR | Status: DC
  Administered 2020-11-17 (×2): 6 [IU] via SUBCUTANEOUS
  Filled 2020-11-17: qty 1

## 2020-11-17 MED ORDER — ENSURE MAX PROTEIN PO LIQD
11.0000 [oz_av] | Freq: Two times a day (BID) | ORAL | Status: DC
  Administered 2020-11-17 – 2020-11-18 (×2): 11 [oz_av] via ORAL
  Filled 2020-11-17: qty 330

## 2020-11-17 MED ORDER — POTASSIUM CHLORIDE 20 MEQ PO PACK
40.0000 meq | PACK | Freq: Once | ORAL | Status: AC
  Administered 2020-11-17: 40 meq via ORAL
  Filled 2020-11-17: qty 2

## 2020-11-17 MED ORDER — INSULIN GLARGINE 100 UNIT/ML ~~LOC~~ SOLN
30.0000 [IU] | SUBCUTANEOUS | Status: DC
  Administered 2020-11-17: 30 [IU] via SUBCUTANEOUS
  Filled 2020-11-17: qty 0.3

## 2020-11-17 NOTE — Progress Notes (Signed)
Inpatient Diabetes Program Recommendations  AACE/ADA: New Consensus Statement on Inpatient Glycemic Control   Target Ranges:  Prepandial:   less than 140 mg/dL      Peak postprandial:   less than 180 mg/dL (1-2 hours)      Critically ill patients:  140 - 180 mg/dL   Results for EMILYGRACE, GROTHE (MRN [id number]) as of 11/17/2020 07:03  Ref. Range 11/16/2020 07:37 11/16/2020 11:12 11/16/2020 13:32 11/16/2020 14:21 11/16/2020 15:21 11/16/2020 16:27 11/16/2020 16:56 11/16/2020 17:22 11/16/2020 18:30 11/16/2020 19:34 11/16/2020 23:42 11/17/2020 03:18  Glucose-Capillary Latest Ref Range: 70 - 99 mg/dL 409 (H) 811 (H) 914 (H) 160 (H) 145 (H) 158 (H)      Lantus 24 units 171 (H) 293 (H) 219 (H)  Novolog 7 units 279 (H)  Novolog 11 units 132 (H)  Novolog 3 units    Review of Glycemic Control  Diabetes history: DM2 Outpatient Diabetes medications: Lantus 30 units daily, Novolog 10 units TID with meals, Metformin XR 1000 mg QAM Current orders for Inpatient glycemic control: Lantus 24 units Q24H, Novolog 0-20 units Q4H  Inpatient Diabetes Program Recommendations:    Insulin: Please consider increasing Lantus to 30 units Q24H and adding Novolog 6 units TID with meals for meal coverage if patient eats at least 50% of meals.  NOTE: Noted consult for Diabetes Coordinator. Chart reviewed. Patient was recently inpatient 10/07/20-10/11/20 and was seen by inpatient diabetes coordinator on 10/10/20. Patient admitted with today with DKA with initial glucose of 878 mg/dl and was started on IV insulin which was transitioned to SQ insulin yesterday afternoon.   Thanks, Orlando Penner, RN, MSN, CDE Diabetes Coordinator Inpatient Diabetes Program [phone] (Team Pager from 8am to 5pm)

## 2020-11-17 NOTE — Progress Notes (Signed)
PROGRESS NOTE    Pamela Lane  ZOX:[id number] DOB: 11/03/1960 DOA: 11/15/2020 PCP: Pcp, No   Brief Narrative:  60 year old female who presented to the emergency department with cute onset of abdominal pain associated with polyuria and polydipsia.  Had markedly elevated blood glucose.  Was found to be in diabetic ketoacidosis.  Admitted to the hospitalist service under stepdown status.  Started on insulin gtt. via Endo tool protocol.  As of this morning the patient remains acidotic.  Her symptoms are markedly improved.  Will continue insulin GTT via Endo tool protocol until closure of anion gap.  Once BMP demonstrates resolution of acidosis will initiate transition to subcutaneous regimen and transfer out of ICU and advance diet at the time.   Seen and examined.  Out of DKA.  Sugars remain poorly controlled.  Insulin dosing adjustment has been made.  Transfer out of ICU.   Assessment & Plan:   Active Problems:   DKA (diabetic ketoacidosis) (HCC)   Diabetic ketoacidosis Insulin-dependent diabetes mellitus, poorly controlled Patient does not know why she went into DKA.  I suspect is dietary indiscretion.  DKA has resolved Plan: Lantus to 30 units daily NovoLog 6 units 3 times daily with meals Resistant sliding scale Carb modified diet Diabetes coordinator consult Tentative plan discharge home 11/18/2020  Acute kidney injury likely secondary to volume depletion and dehydration. Creatinine approaching baseline.  No further IV fluids Recheck creatinine in a.m.  Dyslipidemia. Continue home statin  Depression. Continue home antidepressant therapy  Vitamin B12 deficiency. Oral B12 supplementation  Peripheral neuropathy. Continue home Neurontin  Hypertension. Continue home Cardizem CD    DVT prophylaxis: SQ Lovenox Code Status: Full Family Communication: None today Disposition Plan: Status is: Inpatient  Remains inpatient appropriate because:Inpatient level of  care appropriate due to severity of illness   Dispo: The patient is from: Home              Anticipated d/c is to: Home              Patient currently is not medically stable to d/c.   Difficult to place patient No  Out of DKA but sugars remain uncontrolled.  Will attempt to titrate glycemic regimen.  Tentative plan discharge home 11/18/2020     Level of care: Med-Surg  Consultants:   None   Procedures: None  Antimicrobials:  None   Subjective: Seen and examined.  Endorses some abdominal pain and headache but otherwise stable.  Objective: Vitals:   11/17/20 1100 11/17/20 1200 11/17/20 1300 11/17/20 1400  BP: 129/79   128/65  Pulse: 85 82 86 90  Resp:    20  Temp:      TempSrc:      SpO2: 94% 100% 100% 97%  Weight:      Height:        Intake/Output Summary (Last 24 hours) at 11/17/2020 1413 Last data filed at 11/17/2020 1152 Gross per 24 hour  Intake 3426.36 ml  Output 1600 ml  Net 1826.36 ml   Filed Weights   11/15/20 2314  Weight: 79.9 kg    Examination:  General exam: Appears calm and comfortable  Respiratory system: Clear to auscultation. Respiratory effort normal. Cardiovascular system: S1 & S2 heard, RRR. No JVD, murmurs, rubs, gallops or clicks. No pedal edema. Gastrointestinal system: Abdomen is nondistended, soft and nontender. No organomegaly or masses felt. Normal bowel sounds heard. Central nervous system: Alert and oriented. No focal neurological deficits. Extremities: Symmetric 5 x 5 power. Skin: No  rashes, lesions or ulcers Psychiatry: Judgement and insight appear normal. Mood & affect appropriate.     Data Reviewed: I have personally reviewed following labs and imaging studies  CBC: Recent Labs  Lab 11/15/20 2318 11/16/20 0352 11/16/20 1504  WBC 12.9* 13.5* 8.9  NEUTROABS 11.3*  --   --   HGB 15.2* 14.6 12.1  HCT 44.6 40.3 34.1*  MCV 90.8 85.7 87.2  PLT 311 269 246   Basic Metabolic Panel: Recent Labs  Lab 11/15/20 2318  11/16/20 0352 11/16/20 1012 11/16/20 1413 11/17/20 0442  NA 126* 129* 132* 134* 134*  K 5.1 4.5 3.8 3.2* 3.0*  CL 93* 100 103 104 104  CO2 13* 11* 17* 20* 23  GLUCOSE 878* 476* 367* 168* 126*  BUN 39* 36* 32* 27* 22*  CREATININE 2.79* 2.40* 1.95* 1.52* 1.35*  CALCIUM 8.8* 9.3 8.7* 9.1 8.9  MG 2.4  --   --  1.8 2.0  PHOS  --   --   --  2.6  --    GFR: Estimated Creatinine Clearance: 44.9 mL/min (A) (by C-G formula based on SCr of 1.35 mg/dL (H)). Liver Function Tests: Recent Labs  Lab 11/15/20 2318 11/16/20 1413  AST 10* 19  ALT 12 11  ALKPHOS 88 88  BILITOT 1.5* 0.6  PROT 7.6 6.6  ALBUMIN 4.1 3.6   No results for input(s): LIPASE, AMYLASE in the last 168 hours. No results for input(s): AMMONIA in the last 168 hours. Coagulation Profile: Recent Labs  Lab 11/16/20 0058  INR 1.1   Cardiac Enzymes: No results for input(s): CKTOTAL, CKMB, CKMBINDEX, TROPONINI in the last 168 hours. BNP (last 3 results) No results for input(s): PROBNP in the last 8760 hours. HbA1C: Recent Labs    11/16/20 0352  HGBA1C 14.3*   CBG: Recent Labs  Lab 11/16/20 1934 11/16/20 2342 11/17/20 0318 11/17/20 0731 11/17/20 1126  GLUCAP 219* 279* 132* 193* 334*   Lipid Profile: No results for input(s): CHOL, HDL, LDLCALC, TRIG, CHOLHDL, LDLDIRECT in the last 72 hours. Thyroid Function Tests: No results for input(s): TSH, T4TOTAL, FREET4, T3FREE, THYROIDAB in the last 72 hours. Anemia Panel: No results for input(s): VITAMINB12, FOLATE, FERRITIN, TIBC, IRON, RETICCTPCT in the last 72 hours. Sepsis Labs: Recent Labs  Lab 11/15/20 2318 11/16/20 0352  LATICACIDVEN 2.2* 3.2*    Recent Results (from the past 240 hour(s))  Blood culture (routine single)     Status: None (Preliminary result)   Collection Time: 11/15/20 11:23 PM   Specimen: BLOOD  Result Value Ref Range Status   Specimen Description BLOOD BLOOD RIGHT FOREARM  Final   Special Requests   Final    BOTTLES DRAWN AEROBIC  AND ANAEROBIC Blood Culture results may not be optimal due to an inadequate volume of blood received in culture bottles   Culture   Final    NO GROWTH 1 DAY Performed at [facility], [address] [place], [place] [address]    Report Status PENDING  Incomplete  Urine culture     Status: Abnormal   Collection Time: 11/15/20 11:23 PM   Specimen: In/Out Cath Urine  Result Value Ref Range Status   Specimen Description   Final    IN/OUT CATH URINE Performed at [facility], [address] [place], [place] [address]    Special Requests   Final    NONE Performed at [facility], [address] [place], [place] [address]    Culture MULTIPLE SPECIES PRESENT, SUGGEST  RECOLLECTION (A)  Final   Report Status 11/17/2020 FINAL  Final  Resp Panel by RT-PCR (Flu A&B, Covid) Nasopharyngeal Swab     Status: None   Collection Time: 11/15/20 11:23 PM   Specimen: Nasopharyngeal Swab; Nasopharyngeal(NP) swabs in vial transport medium  Result Value Ref Range Status   SARS Coronavirus 2 by RT PCR NEGATIVE NEGATIVE Final    Comment: (NOTE) SARS-CoV-2 target nucleic acids are NOT DETECTED.  The SARS-CoV-2 RNA is generally detectable in upper respiratory specimens during the acute phase of infection. The lowest concentration of SARS-CoV-2 viral copies this assay can detect is 138 copies/mL. A negative result does not preclude SARS-Cov-2 infection and should not be used as the sole basis for treatment or other patient management decisions. A negative result may occur with  improper specimen collection/handling, submission of specimen other than nasopharyngeal swab, presence of viral mutation(s) within the areas targeted by this assay, and inadequate number of viral copies(<138 copies/mL). A negative result must be combined with clinical observations, patient history, and epidemiological information. The expected result is Negative.  Fact Sheet for Patients:   BloggerCourse.com  Fact Sheet for Healthcare Providers:  SeriousBroker.it  This test is no t yet approved or cleared by the [place] FDA and  has been authorized for detection and/or diagnosis of SARS-CoV-2 by FDA under an Emergency Use Authorization (EUA). This EUA will remain  in effect (meaning this test can be used) for the duration of the COVID-19 declaration under Section 564(b)(1) of the Act, 21 U.S.C.section 360bbb-3(b)(1), unless the authorization is terminated  or revoked sooner.       Influenza A by PCR NEGATIVE NEGATIVE Final   Influenza B by PCR NEGATIVE NEGATIVE Final    Comment: (NOTE) The Xpert Xpress SARS-CoV-2/FLU/RSV plus assay is intended as an aid in the diagnosis of influenza from Nasopharyngeal swab specimens and should not be used as a sole basis for treatment. Nasal washings and aspirates are unacceptable for Xpert Xpress SARS-CoV-2/FLU/RSV testing.  Fact Sheet for Patients: BloggerCourse.com  Fact Sheet for Healthcare Providers: SeriousBroker.it  This test is not yet approved or cleared by the [place] FDA and has been authorized for detection and/or diagnosis of SARS-CoV-2 by FDA under an Emergency Use Authorization (EUA). This EUA will remain in effect (meaning this test can be used) for the duration of the COVID-19 declaration under Section 564(b)(1) of the Act, 21 U.S.C. section 360bbb-3(b)(1), unless the authorization is terminated or revoked.  Performed at [facility], [address] [place], [place] [address]          Radiology Studies: CT ABDOMEN PELVIS WO CONTRAST  Result Date: 11/16/2020 CLINICAL DATA:  60 year old female with abdominal pain. EXAM: CT ABDOMEN AND PELVIS WITHOUT CONTRAST TECHNIQUE: Multidetector CT imaging of the abdomen and pelvis was performed following the standard protocol without IV contrast.  COMPARISON:  CT abdomen pelvis dated 12/28/2018. FINDINGS: Evaluation of this exam is limited in the absence of intravenous contrast as well as due to respiratory motion artifact. Lower chest: The visualized lung bases are clear. There is coronary vascular calcification. No intra-abdominal free air or free fluid. Hepatobiliary: No focal liver abnormality is seen. No gallstones, gallbladder wall thickening, or biliary dilatation. Pancreas: Unremarkable. No pancreatic ductal dilatation or surrounding inflammatory changes. Spleen: Normal in size without focal abnormality. Adrenals/Urinary Tract: The adrenal glands unremarkable. The kidneys, visualized ureters, and urinary bladder appear unremarkable. Stomach/Bowel: There is no bowel obstruction or active inflammation. The appendix is normal. Vascular/Lymphatic: Moderate aortoiliac  atherosclerotic disease. The IVC is unremarkable. No portal venous gas. There is no adenopathy. Reproductive: Hysterectomy.  No adnexal masses. Other: None Musculoskeletal: Degenerative changes of the spine. No acute osseous pathology. IMPRESSION: 1. No acute intra-abdominal or pelvic pathology. No bowel obstruction. Normal appendix. 2. Aortic Atherosclerosis (ICD10-I70.0). Electronically Signed   By: Elgie Collard M.D.   On: 11/16/2020 01:31   DG Chest Port 1 View  Result Date: 11/15/2020 CLINICAL DATA:  Possible sepsis EXAM: PORTABLE CHEST 1 VIEW COMPARISON:  10/08/2020 FINDINGS: The heart size and mediastinal contours are within normal limits. Both lungs are clear. The visualized skeletal structures are unremarkable. IMPRESSION: No active disease. Electronically Signed   By: Alcide Clever M.D.   On: 11/15/2020 23:39        Scheduled Meds: . atorvastatin  40 mg Oral QHS  . buPROPion  150 mg Oral BID  . Chlorhexidine Gluconate Cloth  6 each Topical Daily  . enoxaparin (LOVENOX) injection  40 mg Subcutaneous Q24H  . folic acid  1 mg Oral Daily  . gabapentin  100 mg Oral BID   . insulin aspart  0-20 Units Subcutaneous Q4H  . insulin aspart  6 Units Subcutaneous TID WC  . insulin glargine  30 Units Subcutaneous Q24H  . midodrine  10 mg Oral TID WC  . multivitamin with minerals  1 tablet Oral Daily  . nicotine  21 mg Transdermal Daily  . pantoprazole  40 mg Oral Daily  . thiamine injection  100 mg Intravenous Daily   Or  . thiamine  100 mg Oral Daily  . vitamin B-12  500 mcg Oral Daily   Continuous Infusions: . lactated ringers       LOS: 1 day    Time spent: 25 minutes    Tresa Moore, MD [facility] Pager [phone]  If 7PM-7AM, please contact night-coverage 11/17/2020, 2:13 PM

## 2020-11-17 NOTE — Progress Notes (Signed)
Initial Nutrition Assessment  DOCUMENTATION CODES:   Obesity unspecified  INTERVENTION:   Ensure Max protein supplement BID, each supplement provides 150kcal and 30g of protein.  MVI po daily   Pt at high refeed risk; recommend monitor potassium, magnesium and phosphorus labs daily until stable  NUTRITION DIAGNOSIS:   Inadequate oral intake related to acute illness as evidenced by per patient/family report.  GOAL:   Patient will meet greater than or equal to 90% of their needs  MONITOR:   PO intake,Supplement acceptance,Labs,Weight trends,Skin,I & O's  REASON FOR ASSESSMENT:   Malnutrition Screening Tool    ASSESSMENT:   60 y/o female with h/o DM, etoh abuse and depression who is admitted with DKA and AKI  Met with pt in room today. Pt reports poor appetite and oral intake for the past several days pta. Pt reports that her appetite is improved in hospital. Pt ate 100% of her breakfast this morning and is asking for more food at time of RD visit. Pt reports that she does not drink any supplements at home but reports that she is willing to drink chocolate Ensure in hospital. RD discussed with patient the importance of adequate nutritional intake needed to preserve lean muscle. RD also provided limited DM diet education focusing mainly on eliminating "junk foods and sweetened beverages" that don't provide nutrition; also discussed "white foods" and their effect on her blood sugar and recommended high fiber foods foods, fruits and vegetables. Pt reports that she used to weight over 300lbs. Per chart, pt is down 10lbs(5%) since September; this is not significant. Pt may benefit from outpatient DM education.   Medications reviewed and include: lovenox, folic acid, insulin, MVI, nicotine, protonix, thiamine, B12, LRS _0 /hr  Labs reviewed: Na 134(L), K 3.0(L), BUN 22(H), creat 1.35(H) cbgs- 132, 193, 334 x 24 hrs AIC 14.3(H)- 5/8  NUTRITION - FOCUSED PHYSICAL EXAM:  Flowsheet  Row Most Recent Value  Orbital Region No depletion  Upper Arm Region No depletion  Thoracic and Lumbar Region No depletion  Buccal Region No depletion  Temple Region No depletion  Clavicle Bone Region No depletion  Clavicle and Acromion Bone Region No depletion  Scapular Bone Region No depletion  Dorsal Hand No depletion  Patellar Region Mild depletion  Anterior Thigh Region No depletion  Posterior Calf Region No depletion  Edema (RD Assessment) None  Hair Reviewed  Eyes Reviewed  Mouth Reviewed  Skin Reviewed  Nails Reviewed     Diet Order:   Diet Order            Diet Carb Modified Fluid consistency: Thin; Room service appropriate? Yes  Diet effective now                EDUCATION NEEDS:   Education needs have been addressed  Skin:  Skin Assessment: Reviewed RN Assessment  Last BM:  5/8- Type 7  Height:   Ht Readings from Last 1 Encounters:  11/15/20 5' 2.99" (1.6 m)    Weight:   Wt Readings from Last 1 Encounters:  11/15/20 79.9 kg    Ideal Body Weight:  52 kg  BMI:  Body mass index is 31.21 kg/m.  Estimated Nutritional Needs:   Kcal:  1700-1900kcal/day  Protein:  85-95g/day  Fluid:  1.5-1.8L/day  Koleen Distance MS, RD, LDN Please refer to [facility] for RD and/or RD on-call/weekend/after hours pager

## 2020-11-18 LAB — BLOOD GAS, VENOUS
Acid-base deficit: 14.2 mmol/L — ABNORMAL HIGH (ref 0.0–2.0)
Bicarbonate: 13 mmol/L — ABNORMAL LOW (ref 20.0–28.0)
O2 Saturation: 60.2 %
Patient temperature: 37
pCO2, Ven: 34 mmHg — ABNORMAL LOW (ref 44.0–60.0)
pH, Ven: 7.19 — CL (ref 7.250–7.430)
pO2, Ven: 40 mmHg (ref 32.0–45.0)

## 2020-11-18 LAB — BASIC METABOLIC PANEL
Anion gap: 8 (ref 5–15)
BUN: 21 mg/dL — ABNORMAL HIGH (ref 6–20)
CO2: 22 mmol/L (ref 22–32)
Calcium: 8.5 mg/dL — ABNORMAL LOW (ref 8.9–10.3)
Chloride: 102 mmol/L (ref 98–111)
Creatinine, Ser: 1.04 mg/dL — ABNORMAL HIGH (ref 0.44–1.00)
GFR, Estimated: >60 mL/min (ref >60)
Glucose, Bld: 195 mg/dL — ABNORMAL HIGH (ref 70–99)
Potassium: 3.2 mmol/L — ABNORMAL LOW (ref 3.5–5.1)
Sodium: 132 mmol/L — ABNORMAL LOW (ref 135–145)

## 2020-11-18 LAB — GLUCOSE, CAPILLARY
Glucose-Capillary: 243 mg/dL — ABNORMAL HIGH (ref 70–99)
Glucose-Capillary: 215 mg/dL — ABNORMAL HIGH (ref 70–99)

## 2020-11-18 MED ORDER — GUAIFENESIN 100 MG/5ML PO SOLN
5.0000 mL | ORAL | Status: DC | PRN
  Filled 2020-11-18: qty 5

## 2020-11-18 MED ORDER — INSULIN GLARGINE 100 UNIT/ML ~~LOC~~ SOLN
36.0000 [IU] | SUBCUTANEOUS | Status: DC
  Filled 2020-11-18: qty 0.36

## 2020-11-18 MED ORDER — POTASSIUM CHLORIDE CRYS ER 20 MEQ PO TBCR
40.0000 meq | EXTENDED_RELEASE_TABLET | Freq: Once | ORAL | Status: AC
  Administered 2020-11-18: 40 meq via ORAL
  Filled 2020-11-18: qty 2

## 2020-11-18 MED ORDER — INSULIN ASPART 100 UNIT/ML IJ SOLN
10.0000 [IU] | Freq: Three times a day (TID) | INTRAMUSCULAR | Status: DC
  Administered 2020-11-18: 10 [IU] via SUBCUTANEOUS
  Filled 2020-11-18: qty 1

## 2020-11-18 MED ORDER — NICOTINE 21 MG/24HR TD PT24: 21.0000 mg | MEDICATED_PATCH | Freq: Every day | TRANSDERMAL | 0 refills | Status: DC

## 2020-11-18 MED ORDER — INSULIN GLARGINE 100 UNIT/ML SOLOSTAR PEN: 36.0000 [IU] | PEN_INJECTOR | Freq: Every day | SUBCUTANEOUS | 0 refills | Status: DC

## 2020-11-18 NOTE — Progress Notes (Signed)
Inpatient Diabetes Program Recommendations  AACE/ADA: New Consensus Statement on Inpatient Glycemic Control  Target Ranges:  Prepandial:   less than 140 mg/dL      Peak postprandial:   less than 180 mg/dL (1-2 hours)      Critically ill patients:  140 - 180 mg/dL   Results for Pamela Lane, Pamela Lane (MRN [id number]) as of 11/18/2020 06:30  Ref. Range 11/17/2020 07:31 11/17/2020 11:26 11/17/2020 15:42 11/17/2020 19:33 11/17/2020 23:44 11/18/2020 03:25  Glucose-Capillary Latest Ref Range: 70 - 99 mg/dL 623 (H)  Novolog 4 units 334 (H)  Novolog 21 units 279 (H)  Novolog 17 units  Lantus 30 units 401 (H)  Novolog 20 units 128 (H)  Novolog 3 units 243 (H)  Novolog 7 units   Review of Glycemic Control  Diabetes history:DM2 Outpatient Diabetes medications:Lantus 30 units daily, Novolog 10 units TID with meals, Metformin XR 1000 mg QAM Current orders for Inpatient glycemic control:Lantus 30 units Q24H, Novolog 0-20 units Q4H, Novolog 6 units TID  Inpatient Diabetes Program Recommendations:  Insulin: Please consider increasing Lantus to 36 units Q24H and increasing meal coverage to Novolog 10 units TID with meals if patient eats at least 50% of meals.  Thanks, Orlando Penner, RN, MSN, CDE Diabetes Coordinator Inpatient Diabetes Program [phone] (Team Pager from 8am to 5pm)

## 2020-11-18 NOTE — Discharge Summary (Signed)
Physician Discharge Summary  Pamela Lane XVQ:[id number] DOB: 18-Jun-1961 DOA: 11/15/2020  PCP: Pamela Lane, No  Admit date: 11/15/2020 Discharge date: 11/18/2020  Admitted From: Home Disposition:  Home  Recommendations for Outpatient Follow-up:  1. Follow up with PCP in 1-2 weeks 2. Consider referral to endocrinology  Home Health: No Equipment/Devices: None Discharge Condition: Stable CODE STATUS: Full Diet recommendation: Carb modified Brief/Interim Summary: Pamela Lane who presented to the emergency department with cute onset of abdominal pain associated with polyuria and polydipsia. Had markedly elevated blood glucose. Was found to be in diabetic ketoacidosis. Admitted to the hospitalist service under stepdown status. Started on insulin gtt. via Endo tool protocol. As of this morning the patient remains acidotic. Her symptoms are markedly improved. Will continue insulin GTT via Endo tool protocol until closure of anion gap. Once BMP demonstrates resolution of acidosis will initiate transition to subcutaneous regimen and transfer out of ICU and advance diet at the time.   Seen and examined.  Out of DKA.  Sugars remain poorly controlled.  Insulin dosing adjustment has been made.  Transfer out of ICU.  On day of discharge sugars have improved and patient's is no longer acidotic.  She is tolerating p.o. intake without nausea vomiting or other issues.  We had a lengthy discussion regarding her poor diabetes control and worsening over the past several months.  She expressed understanding.  I also relayed these concerns to the patient's daughter via phone.  At time of discharge increase home Lantus to 36 units daily, continue low log 10 units 3 times daily in addition to sliding scale.  Patient states that she has all necessary diabetic supplies.  I also suggest close follow-up with her primary care physician and consideration for referral to endocrinology.   Discharge Diagnoses:   Active Problems:   DKA (diabetic ketoacidosis) (HCC)  Diabetic ketoacidosis Insulin-dependent diabetes mellitus, poorly controlled Patient does not know why she went into DKA.  I suspect is dietary indiscretion.  DKA has resolved At time of discharge recommend Lantus 36 units daily, NovoLog 10 units 3 times daily with meals, sliding scale coverage.  Also strongly suggest close follow-up with a primary care and consideration for referral to endocrinology  Acute kidney injury likely secondary to volume depletion and dehydration. Creatinine approaching baseline.  No further IV fluids  Dyslipidemia. Continue home statin  Depression. Continue home antidepressant therapy  Vitamin B12 deficiency. Oral B12 supplementation  Peripheral neuropathy. Continue home Neurontin  Hypertension. Continue home Cardizem CD  Discharge Instructions  Discharge Instructions    Diet - low sodium heart healthy   Complete by: As directed    Increase activity slowly   Complete by: As directed      Allergies as of 11/18/2020   No Known Allergies     Medication List    TAKE these medications   albuterol 108 (90 Base) MCG/ACT inhaler Commonly known as: VENTOLIN HFA Inhale 2 puffs into the lungs every 6 (six) hours as needed for wheezing or shortness of breath.   atorvastatin 40 MG tablet Commonly known as: LIPITOR TAKE 1 TABLET BY MOUTH AT BEDTIME.   buPROPion 150 MG 12 hr tablet Commonly known as: WELLBUTRIN SR Take 150 mg by mouth 2 (two) times daily.   diltiazem 240 MG 24 hr capsule Commonly known as: CARDIZEM CD Take 1 capsule (240 mg total) by mouth daily.   folic acid 1 MG tablet Commonly known as: FOLVITE Take 1 tablet (1 mg total) by mouth daily.  gabapentin 100 MG capsule Commonly known as: NEURONTIN TAKE 1 CAPSULE BY MOUTH 2 TIMES DAILY.   insulin glargine 100 UNIT/ML Solostar Pen Commonly known as: LANTUS Inject 36 Units into the skin daily. What changed: how  much to take   lisinopril 10 MG tablet Commonly known as: ZESTRIL Take 10 mg by mouth daily.   metFORMIN 500 MG 24 hr tablet Commonly known as: GLUCOPHAGE-XR TAKE 2 TABLETS (1,000 MG TOTAL) BY MOUTH DAILY WITH BREAKFAST.   multivitamin with minerals Tabs tablet Take 1 tablet by mouth daily.   nicotine 21 mg/24hr patch Commonly known as: NICODERM CQ - dosed in mg/24 hours Place 1 patch (21 mg total) onto the skin daily.   NovoLOG FlexPen 100 UNIT/ML FlexPen Generic drug: insulin aspart INJECT 10 UNITS INTO THE SKIN 3 TIMES DAILY WITH MEALS.   omeprazole 20 MG capsule Commonly known as: PRILOSEC Take 1 capsule by mouth daily.   thiamine 100 MG tablet Take 1 tablet (100 mg total) by mouth daily.   vitamin B-12 500 MCG tablet Commonly known as: CYANOCOBALAMIN Take 500 mcg by mouth daily.       No Known Allergies  Consultations: None  Procedures/Studies: CT ABDOMEN PELVIS WO CONTRAST  Result Date: 11/16/2020 CLINICAL DATA:  Pamela Lane with abdominal pain. EXAM: CT ABDOMEN AND PELVIS WITHOUT CONTRAST TECHNIQUE: Multidetector CT imaging of the abdomen and pelvis was performed following the standard protocol without IV contrast. COMPARISON:  CT abdomen pelvis dated 12/28/2018. FINDINGS: Evaluation of this exam is limited in the absence of intravenous contrast as well as due to respiratory motion artifact. Lower chest: The visualized lung bases are clear. There is coronary vascular calcification. No intra-abdominal free air or free fluid. Hepatobiliary: No focal liver abnormality is seen. No gallstones, gallbladder wall thickening, or biliary dilatation. Pancreas: Unremarkable. No pancreatic ductal dilatation or surrounding inflammatory changes. Spleen: Normal in size without focal abnormality. Adrenals/Urinary Tract: The adrenal glands unremarkable. The kidneys, visualized ureters, and urinary bladder appear unremarkable. Stomach/Bowel: There is no bowel obstruction or  active inflammation. The appendix is normal. Vascular/Lymphatic: Moderate aortoiliac atherosclerotic disease. The IVC is unremarkable. No portal venous gas. There is no adenopathy. Reproductive: Hysterectomy.  No adnexal masses. Other: None Musculoskeletal: Degenerative changes of the spine. No acute osseous pathology. IMPRESSION: 1. No acute intra-abdominal or pelvic pathology. No bowel obstruction. Normal appendix. 2. Aortic Atherosclerosis (ICD10-I70.0). Electronically Signed   By: Elgie Collard M.D.   On: 11/16/2020 01:31   DG Chest Port 1 View  Result Date: 11/15/2020 CLINICAL DATA:  Possible sepsis EXAM: PORTABLE CHEST 1 VIEW COMPARISON:  10/08/2020 FINDINGS: The heart size and mediastinal contours are within normal limits. Both lungs are clear. The visualized skeletal structures are unremarkable. IMPRESSION: No active disease. Electronically Signed   By: Alcide Clever M.D.   On: 11/15/2020 23:39    (Echo, Carotid, EGD, Colonoscopy, ERCP)    Subjective: Patient seen and examined on the day of discharge.  Stable, no distress.  No abdominal pain.  Tolerating p.o. intake without nausea or vomiting.  Discharge Exam: Vitals:   11/18/20 0500 11/18/20 0800  BP:  131/90  Pulse: 86 92  Resp: 16 18  Temp:  98.1 F (36.7 C)  SpO2: 98% 99%   Vitals:   11/18/20 0300 11/18/20 0400 11/18/20 0500 11/18/20 0800  BP:    131/90  Pulse: 82 83 86 92  Resp: (!) 21 16 16 18   Temp:  98.4 F (36.9 C)  98.1 F (36.7 C)  TempSrc:  Oral    SpO2: 97% 96% 98% 99%  Weight:      Height:        General: Pt is alert, awake, not in acute distress Cardiovascular: RRR, S1/S2 +, no rubs, no gallops Respiratory: CTA bilaterally, no wheezing, no rhonchi Abdominal: Soft, NT, ND, bowel sounds + Extremities: no edema, no cyanosis    The results of significant diagnostics from this hospitalization (including imaging, microbiology, ancillary and laboratory) are listed below for reference.      Microbiology: Recent Results (from the past 240 hour(s))  Blood culture (routine single)     Status: None (Preliminary result)   Collection Time: 11/15/20 11:23 PM   Specimen: BLOOD  Result Value Ref Range Status   Specimen Description BLOOD BLOOD RIGHT FOREARM  Final   Special Requests   Final    BOTTLES DRAWN AEROBIC AND ANAEROBIC Blood Culture results may not be optimal due to an inadequate volume of blood received in culture bottles   Culture   Final    NO GROWTH 1 DAY Performed at [facility], [address] [place], [place] [address]    Report Status PENDING  Incomplete  Urine culture     Status: Abnormal   Collection Time: 11/15/20 11:23 PM   Specimen: In/Out Cath Urine  Result Value Ref Range Status   Specimen Description   Final    IN/OUT CATH URINE Performed at [facility], [address] [place], [place] [address]    Special Requests   Final    NONE Performed at [facility], [address] [place], [place] [address]    Culture MULTIPLE SPECIES PRESENT, SUGGEST RECOLLECTION (A)  Final   Report Status 11/17/2020 FINAL  Final  Resp Panel by RT-PCR (Flu A&B, Covid) Nasopharyngeal Swab     Status: None   Collection Time: 11/15/20 11:23 PM   Specimen: Nasopharyngeal Swab; Nasopharyngeal(NP) swabs in vial transport medium  Result Value Ref Range Status   SARS Coronavirus 2 by RT PCR NEGATIVE NEGATIVE Final    Comment: (NOTE) SARS-CoV-2 target nucleic acids are NOT DETECTED.  The SARS-CoV-2 RNA is generally detectable in upper respiratory specimens during the acute phase of infection. The lowest concentration of SARS-CoV-2 viral copies this assay can detect is 138 copies/mL. A negative result does not preclude SARS-Cov-2 infection and should not be used as the sole basis for treatment or other patient management decisions. A negative result may occur with  improper specimen collection/handling, submission of specimen other than  nasopharyngeal swab, presence of viral mutation(s) within the areas targeted by this assay, and inadequate number of viral copies(<138 copies/mL). A negative result must be combined with clinical observations, patient history, and epidemiological information. The expected result is Negative.  Fact Sheet for Patients:  BloggerCourse.comhttps://www.fda.gov/media/152166/download  Fact Sheet for Healthcare Providers:  SeriousBroker.ithttps://www.fda.gov/media/152162/download  This test is no t yet approved or cleared by the [place] FDA and  has been authorized for detection and/or diagnosis of SARS-CoV-2 by FDA under an Emergency Use Authorization (EUA). This EUA will remain  in effect (meaning this test can be used) for the duration of the COVID-19 declaration under Section 564(b)(1) of the Act, 21 U.S.C.section 360bbb-3(b)(1), unless the authorization is terminated  or revoked sooner.       Influenza A by PCR NEGATIVE NEGATIVE Final   Influenza B by PCR NEGATIVE NEGATIVE Final    Comment: (NOTE) The Xpert Xpress SARS-CoV-2/FLU/RSV plus assay is intended as an aid in the diagnosis of  influenza from Nasopharyngeal swab specimens and should not be used as a sole basis for treatment. Nasal washings and aspirates are unacceptable for Xpert Xpress SARS-CoV-2/FLU/RSV testing.  Fact Sheet for Patients: BloggerCourse.com  Fact Sheet for Healthcare Providers: SeriousBroker.it  This test is not yet approved or cleared by the [place] FDA and has been authorized for detection and/or diagnosis of SARS-CoV-2 by FDA under an Emergency Use Authorization (EUA). This EUA will remain in effect (meaning this test can be used) for the duration of the COVID-19 declaration under Section 564(b)(1) of the Act, 21 U.S.C. section 360bbb-3(b)(1), unless the authorization is terminated or revoked.  Performed at [facility], [address] [place], [place]  [address]      Labs: BNP (last 3 results) Recent Labs    10/07/20 2219  BNP 88.9   Basic Metabolic Panel: Recent Labs  Lab 11/15/20 2318 11/16/20 0352 11/16/20 1012 11/16/20 1413 11/17/20 0442 11/18/20 0201  NA 126* 129* 132* 134* 134* 132*  K 5.1 4.5 3.8 3.2* 3.0* 3.2*  CL 93* 100 103 104 104 102  CO2 13* 11* 17* 20* 23 22  GLUCOSE 878* 476* 367* 168* 126* 195*  BUN 39* 36* 32* 27* 22* 21*  CREATININE 2.79* 2.40* 1.95* 1.52* 1.35* 1.04*  CALCIUM 8.8* 9.3 8.7* 9.1 8.9 8.5*  MG 2.4  --   --  1.8 2.0  --   PHOS  --   --   --  2.6  --   --    Liver Function Tests: Recent Labs  Lab 11/15/20 2318 11/16/20 1413  AST 10* 19  ALT 12 11  ALKPHOS 88 88  BILITOT 1.5* 0.6  PROT 7.6 6.6  ALBUMIN 4.1 3.6   No results for input(s): LIPASE, AMYLASE in the last 168 hours. No results for input(s): AMMONIA in the last 168 hours. CBC: Recent Labs  Lab 11/15/20 2318 11/16/20 0352 11/16/20 1504  WBC 12.9* 13.5* 8.9  NEUTROABS 11.3*  --   --   HGB 15.2* 14.6 12.1  HCT 44.6 40.3 34.1*  MCV 90.8 85.7 87.2  PLT 311 269 246   Cardiac Enzymes: No results for input(s): CKTOTAL, CKMB, CKMBINDEX, TROPONINI in the last 168 hours. BNP: Invalid input(s): POCBNP CBG: Recent Labs  Lab 11/17/20 1542 11/17/20 1933 11/17/20 2344 11/18/20 0325 11/18/20 0736  GLUCAP 279* 401* 128* 243* 215*   D-Dimer No results for input(s): DDIMER in the last 72 hours. Hgb A1c Recent Labs    11/16/20 0352  HGBA1C 14.3*   Lipid Profile No results for input(s): CHOL, HDL, LDLCALC, TRIG, CHOLHDL, LDLDIRECT in the last 72 hours. Thyroid function studies No results for input(s): TSH, T4TOTAL, T3FREE, THYROIDAB in the last 72 hours.  Invalid input(s): FREET3 Anemia work up No results for input(s): VITAMINB12, FOLATE, FERRITIN, TIBC, IRON, RETICCTPCT in the last 72 hours. Urinalysis    Component Value Date/Time   COLORURINE YELLOW (A) 11/15/2020 2323   APPEARANCEUR CLOUDY (A) 11/15/2020 2323    LABSPEC 1.023 11/15/2020 2323   PHURINE 6.0 11/15/2020 2323   GLUCOSEU >=500 (A) 11/15/2020 2323   HGBUR NEGATIVE 11/15/2020 2323   BILIRUBINUR NEGATIVE 11/15/2020 2323   KETONESUR 20 (A) 11/15/2020 2323   PROTEINUR 30 (A) 11/15/2020 2323   NITRITE NEGATIVE 11/15/2020 2323   LEUKOCYTESUR NEGATIVE 11/15/2020 2323   Sepsis Labs Invalid input(s): PROCALCITONIN,  WBC,  LACTICIDVEN Microbiology Recent Results (from the past 240 hour(s))  Blood culture (routine single)     Status: None (Preliminary result)  Collection Time: 11/15/20 11:23 PM   Specimen: BLOOD  Result Value Ref Range Status   Specimen Description BLOOD BLOOD RIGHT FOREARM  Final   Special Requests   Final    BOTTLES DRAWN AEROBIC AND ANAEROBIC Blood Culture results may not be optimal due to an inadequate volume of blood received in culture bottles   Culture   Final    NO GROWTH 1 DAY Performed at [facility], [address] [place], [place] [address]    Report Status PENDING  Incomplete  Urine culture     Status: Abnormal   Collection Time: 11/15/20 11:23 PM   Specimen: In/Out Cath Urine  Result Value Ref Range Status   Specimen Description   Final    IN/OUT CATH URINE Performed at [facility], [address], [place], [place] [address]    Special Requests   Final    NONE Performed at [facility], [address], [place], [place] [address]    Culture MULTIPLE SPECIES PRESENT, SUGGEST RECOLLECTION (A)  Final   Report Status 11/17/2020 FINAL  Final  Resp Panel by RT-PCR (Flu A&B, Covid) Nasopharyngeal Swab     Status: None   Collection Time: 11/15/20 11:23 PM   Specimen: Nasopharyngeal Swab; Nasopharyngeal(NP) swabs in vial transport medium  Result Value Ref Range Status   SARS Coronavirus 2 by RT PCR NEGATIVE NEGATIVE Final    Comment: (NOTE) SARS-CoV-2 target nucleic acids are NOT DETECTED.  The SARS-CoV-2 RNA is generally detectable in upper respiratory specimens during  the acute phase of infection. The lowest concentration of SARS-CoV-2 viral copies this assay can detect is 138 copies/mL. A negative result does not preclude SARS-Cov-2 infection and should not be used as the sole basis for treatment or other patient management decisions. A negative result may occur with  improper specimen collection/handling, submission of specimen other than nasopharyngeal swab, presence of viral mutation(s) within the areas targeted by this assay, and inadequate number of viral copies(<138 copies/mL). A negative result must be combined with clinical observations, patient history, and epidemiological information. The expected result is Negative.  Fact Sheet for Patients:  BloggerCourse.com  Fact Sheet for Healthcare Providers:  SeriousBroker.it  This test is no t yet approved or cleared by the [place] FDA and  has been authorized for detection and/or diagnosis of SARS-CoV-2 by FDA under an Emergency Use Authorization (EUA). This EUA will remain  in effect (meaning this test can be used) for the duration of the COVID-19 declaration under Section 564(b)(1) of the Act, 21 U.S.C.section 360bbb-3(b)(1), unless the authorization is terminated  or revoked sooner.       Influenza A by PCR NEGATIVE NEGATIVE Final   Influenza B by PCR NEGATIVE NEGATIVE Final    Comment: (NOTE) The Xpert Xpress SARS-CoV-2/FLU/RSV plus assay is intended as an aid in the diagnosis of influenza from Nasopharyngeal swab specimens and should not be used as a sole basis for treatment. Nasal washings and aspirates are unacceptable for Xpert Xpress SARS-CoV-2/FLU/RSV testing.  Fact Sheet for Patients: BloggerCourse.com  Fact Sheet for Healthcare Providers: SeriousBroker.it  This test is not yet approved or cleared by the [place] FDA and has been authorized for detection and/or  diagnosis of SARS-CoV-2 by FDA under an Emergency Use Authorization (EUA). This EUA will remain in effect (meaning this test can be used) for the duration of the COVID-19 declaration under Section 564(b)(1) of the Act, 21 U.S.C. section 360bbb-3(b)(1), unless the authorization is terminated or revoked.  Performed at [facility], [address] [place], [place] [address]      Time coordinating discharge: Over 30 minutes  SIGNED:   Tresa Moore, MD  [facility] 11/18/2020, 1:41 PM Pager   If 7PM-7AM, please contact night-coverage

## 2020-11-18 NOTE — Progress Notes (Signed)
Patient alert and oriented. No complaints of pain or shortness of breath. Patient is tolerating diet and using commode with no complications. Removed patients IV's and telemtry. Went over discharge instructions. Patient has no further questions. Per Corrie Dandy she does have a PCP, Jeralyn Ruths and she wishes to schedule that appointment herself. She also states she has an endocrinologist and will schedule a follow up visit. I offered to get those scheduled for her but states that she needs to go and she is able to do it herself. Spoke with Care Manager, Laureen Ochs and she made aware. Patient states she does not need any further assistance.

## 2020-11-21 LAB — CULTURE, BLOOD (SINGLE): Culture: NO GROWTH

## 2020-12-12 ENCOUNTER — Other Ambulatory Visit: Payer: Self-pay

## 2020-12-12 ENCOUNTER — Observation Stay
Admission: EM | Admit: 2020-12-12 | Discharge: 2020-12-13 | DRG: 638 | Disposition: A | Discharge status: Home / Self Care | Payer: Medicaid Other | Attending: Internal Medicine | Admitting: Internal Medicine

## 2020-12-12 DIAGNOSIS — F319 Bipolar disorder, unspecified: Secondary | ICD-10-CM | POA: Diagnosis not present

## 2020-12-12 DIAGNOSIS — J45909 Unspecified asthma, uncomplicated: Secondary | ICD-10-CM | POA: Diagnosis not present

## 2020-12-12 DIAGNOSIS — Z9114 Patient's other noncompliance with medication regimen: Secondary | ICD-10-CM | POA: Diagnosis not present

## 2020-12-12 DIAGNOSIS — Z720 Tobacco use: Secondary | ICD-10-CM | POA: Diagnosis present

## 2020-12-12 DIAGNOSIS — N183 Chronic kidney disease, stage 3 unspecified: Secondary | ICD-10-CM | POA: Diagnosis not present

## 2020-12-12 DIAGNOSIS — Z79899 Other long term (current) drug therapy: Secondary | ICD-10-CM

## 2020-12-12 DIAGNOSIS — Z90710 Acquired absence of both cervix and uterus: Secondary | ICD-10-CM

## 2020-12-12 DIAGNOSIS — I10 Essential (primary) hypertension: Secondary | ICD-10-CM | POA: Diagnosis present

## 2020-12-12 DIAGNOSIS — R451 Restlessness and agitation: Secondary | ICD-10-CM | POA: Diagnosis not present

## 2020-12-12 DIAGNOSIS — F17210 Nicotine dependence, cigarettes, uncomplicated: Secondary | ICD-10-CM | POA: Diagnosis present

## 2020-12-12 DIAGNOSIS — I129 Hypertensive chronic kidney disease with stage 1 through stage 4 chronic kidney disease, or unspecified chronic kidney disease: Secondary | ICD-10-CM | POA: Diagnosis present

## 2020-12-12 DIAGNOSIS — Z20822 Contact with and (suspected) exposure to covid-19: Secondary | ICD-10-CM | POA: Diagnosis present

## 2020-12-12 DIAGNOSIS — E1122 Type 2 diabetes mellitus with diabetic chronic kidney disease: Secondary | ICD-10-CM | POA: Diagnosis not present

## 2020-12-12 DIAGNOSIS — Z794 Long term (current) use of insulin: Secondary | ICD-10-CM | POA: Diagnosis not present

## 2020-12-12 DIAGNOSIS — Z8249 Family history of ischemic heart disease and other diseases of the circulatory system: Secondary | ICD-10-CM

## 2020-12-12 DIAGNOSIS — F3130 Bipolar disorder, current episode depressed, mild or moderate severity, unspecified: Secondary | ICD-10-CM | POA: Diagnosis not present

## 2020-12-12 DIAGNOSIS — E1110 Type 2 diabetes mellitus with ketoacidosis without coma: Secondary | ICD-10-CM | POA: Diagnosis not present

## 2020-12-12 DIAGNOSIS — Z7984 Long term (current) use of oral hypoglycemic drugs: Secondary | ICD-10-CM | POA: Diagnosis not present

## 2020-12-12 DIAGNOSIS — F1410 Cocaine abuse, uncomplicated: Secondary | ICD-10-CM | POA: Diagnosis present

## 2020-12-12 DIAGNOSIS — E871 Hypo-osmolality and hyponatremia: Secondary | ICD-10-CM | POA: Diagnosis present

## 2020-12-12 LAB — BLOOD GAS, VENOUS
Acid-base deficit: 17.3 mmol/L — ABNORMAL HIGH (ref 0.0–2.0)
Bicarbonate: 8.4 mmol/L — ABNORMAL LOW (ref 20.0–28.0)
O2 Saturation: 81.9 %
Patient temperature: 37
pCO2, Ven: 21 mmHg — ABNORMAL LOW (ref 44.0–60.0)
pH, Ven: 7.21 — ABNORMAL LOW (ref 7.250–7.430)
pO2, Ven: 57 mmHg — ABNORMAL HIGH (ref 32.0–45.0)

## 2020-12-12 LAB — BASIC METABOLIC PANEL
Anion gap: 22 — ABNORMAL HIGH (ref 5–15)
Anion gap: 18 — ABNORMAL HIGH (ref 5–15)
Anion gap: 12 (ref 5–15)
BUN: 24 mg/dL — ABNORMAL HIGH (ref 6–20)
BUN: 24 mg/dL — ABNORMAL HIGH (ref 6–20)
BUN: 23 mg/dL — ABNORMAL HIGH (ref 6–20)
CO2: 11 mmol/L — ABNORMAL LOW (ref 22–32)
CO2: 15 mmol/L — ABNORMAL LOW (ref 22–32)
CO2: 18 mmol/L — ABNORMAL LOW (ref 22–32)
Calcium: 9.5 mg/dL (ref 8.9–10.3)
Calcium: 10 mg/dL (ref 8.9–10.3)
Calcium: 9.4 mg/dL (ref 8.9–10.3)
Chloride: 98 mmol/L (ref 98–111)
Chloride: 103 mmol/L (ref 98–111)
Chloride: 106 mmol/L (ref 98–111)
Creatinine, Ser: 1.6 mg/dL — ABNORMAL HIGH (ref 0.44–1.00)
Creatinine, Ser: 1.25 mg/dL — ABNORMAL HIGH (ref 0.44–1.00)
Creatinine, Ser: 1 mg/dL (ref 0.44–1.00)
GFR, Estimated: 37 mL/min — ABNORMAL LOW (ref >60)
GFR, Estimated: 50 mL/min — ABNORMAL LOW (ref >60)
GFR, Estimated: >60 mL/min (ref >60)
Glucose, Bld: 544 mg/dL (ref 70–99)
Glucose, Bld: 317 mg/dL — ABNORMAL HIGH (ref 70–99)
Glucose, Bld: 201 mg/dL — ABNORMAL HIGH (ref 70–99)
Potassium: 5.1 mmol/L (ref 3.5–5.1)
Potassium: 4.4 mmol/L (ref 3.5–5.1)
Potassium: 3.9 mmol/L (ref 3.5–5.1)
Sodium: 131 mmol/L — ABNORMAL LOW (ref 135–145)
Sodium: 136 mmol/L (ref 135–145)
Sodium: 136 mmol/L (ref 135–145)

## 2020-12-12 LAB — GLUCOSE, CAPILLARY
Glucose-Capillary: 200 mg/dL — ABNORMAL HIGH (ref 70–99)
Glucose-Capillary: 215 mg/dL — ABNORMAL HIGH (ref 70–99)
Glucose-Capillary: 183 mg/dL — ABNORMAL HIGH (ref 70–99)
Glucose-Capillary: 209 mg/dL — ABNORMAL HIGH (ref 70–99)
Glucose-Capillary: 237 mg/dL — ABNORMAL HIGH (ref 70–99)

## 2020-12-12 LAB — COMPREHENSIVE METABOLIC PANEL
ALT: 16 U/L (ref 0–44)
AST: 17 U/L (ref 15–41)
Albumin: 4.4 g/dL (ref 3.5–5.0)
Alkaline Phosphatase: 110 U/L (ref 38–126)
Anion gap: 23 — ABNORMAL HIGH (ref 5–15)
BUN: 24 mg/dL — ABNORMAL HIGH (ref 6–20)
CO2: 10 mmol/L — ABNORMAL LOW (ref 22–32)
Calcium: 9.2 mg/dL (ref 8.9–10.3)
Chloride: 98 mmol/L (ref 98–111)
Creatinine, Ser: 1.39 mg/dL — ABNORMAL HIGH (ref 0.44–1.00)
GFR, Estimated: 44 mL/min — ABNORMAL LOW (ref >60)
Glucose, Bld: 559 mg/dL (ref 70–99)
Potassium: 4.8 mmol/L (ref 3.5–5.1)
Sodium: 131 mmol/L — ABNORMAL LOW (ref 135–145)
Total Bilirubin: 2.6 mg/dL — ABNORMAL HIGH (ref 0.3–1.2)
Total Protein: 8.3 g/dL — ABNORMAL HIGH (ref 6.5–8.1)

## 2020-12-12 LAB — CBC
HCT: 37.3 % (ref 36.0–46.0)
Hemoglobin: 13 g/dL (ref 12.0–15.0)
MCH: 31.3 pg (ref 26.0–34.0)
MCHC: 34.9 g/dL (ref 30.0–36.0)
MCV: 89.7 fL (ref 80.0–100.0)
Platelets: 283 10*3/uL (ref 150–400)
RBC: 4.16 MIL/uL (ref 3.87–5.11)
RDW: 12.2 % (ref 11.5–15.5)
WBC: 8.8 10*3/uL (ref 4.0–10.5)
nRBC: 0 % (ref 0.0–0.2)

## 2020-12-12 LAB — TYPE AND SCREEN
ABO/RH(D): A POS
Antibody Screen: NEGATIVE

## 2020-12-12 LAB — BETA-HYDROXYBUTYRIC ACID
Beta-Hydroxybutyric Acid: >8 mmol/L — ABNORMAL HIGH (ref 0.05–0.27)
Beta-Hydroxybutyric Acid: 2.12 mmol/L — ABNORMAL HIGH (ref 0.05–0.27)

## 2020-12-12 LAB — PROTIME-INR
INR: 1 (ref 0.8–1.2)
Prothrombin Time: 13.4 seconds (ref 11.4–15.2)

## 2020-12-12 LAB — MRSA PCR SCREENING: MRSA by PCR: NEGATIVE

## 2020-12-12 LAB — CBG MONITORING, ED
Glucose-Capillary: 588 mg/dL (ref 70–99)
Glucose-Capillary: 466 mg/dL — ABNORMAL HIGH (ref 70–99)
Glucose-Capillary: 417 mg/dL — ABNORMAL HIGH (ref 70–99)

## 2020-12-12 LAB — APTT: aPTT: 24 seconds (ref 24–36)

## 2020-12-12 LAB — LIPASE, BLOOD: Lipase: 18 U/L (ref 11–51)

## 2020-12-12 MED ORDER — DEXTROSE 50 % IV SOLN
0.0000 mL | INTRAVENOUS | Status: DC | PRN
Start: 2020-12-12 — End: 2020-12-13

## 2020-12-12 MED ORDER — BUPROPION HCL ER (SR) 150 MG PO TB12
150.0000 mg | ORAL_TABLET | Freq: Two times a day (BID) | ORAL | Status: DC
  Administered 2020-12-12 – 2020-12-13 (×2): 150 mg via ORAL
  Filled 2020-12-12 (×3): qty 1

## 2020-12-12 MED ORDER — LACTATED RINGERS IV BOLUS
20.0000 mL/kg | Freq: Once | INTRAVENOUS | Status: AC
  Administered 2020-12-12: 1600 mL via INTRAVENOUS

## 2020-12-12 MED ORDER — THIAMINE HCL 100 MG PO TABS
100.0000 mg | ORAL_TABLET | Freq: Every day | ORAL | Status: DC
  Filled 2020-12-12: qty 1

## 2020-12-12 MED ORDER — LORAZEPAM 2 MG/ML IJ SOLN
2.0000 mg | Freq: Once | INTRAMUSCULAR | Status: AC
  Administered 2020-12-12: 2 mg via INTRAVENOUS
  Filled 2020-12-12: qty 1

## 2020-12-12 MED ORDER — INSULIN REGULAR(HUMAN) IN NACL 100-0.9 UT/100ML-% IV SOLN
INTRAVENOUS | Status: DC
  Administered 2020-12-12: 12 [IU]/h via INTRAVENOUS
  Filled 2020-12-12: qty 100

## 2020-12-12 MED ORDER — ENOXAPARIN SODIUM 40 MG/0.4ML IJ SOSY
40.0000 mg | PREFILLED_SYRINGE | INTRAMUSCULAR | Status: DC
  Administered 2020-12-12: 40 mg via SUBCUTANEOUS
  Filled 2020-12-12: qty 0.4

## 2020-12-12 MED ORDER — POTASSIUM CHLORIDE 10 MEQ/100ML IV SOLN
10.0000 meq | INTRAVENOUS | Status: AC
  Administered 2020-12-12 (×2): 10 meq via INTRAVENOUS
  Filled 2020-12-12: qty 100

## 2020-12-12 MED ORDER — NICOTINE 21 MG/24HR TD PT24: 21.0000 mg | MEDICATED_PATCH | Freq: Every day | TRANSDERMAL | Status: DC

## 2020-12-12 MED ORDER — ALBUTEROL SULFATE HFA 108 (90 BASE) MCG/ACT IN AERS: 2.0000 | INHALATION_SPRAY | Freq: Four times a day (QID) | RESPIRATORY_TRACT | Status: DC | PRN

## 2020-12-12 MED ORDER — PANTOPRAZOLE SODIUM 40 MG IV SOLR
40.0000 mg | INTRAVENOUS | Status: DC
  Administered 2020-12-12: 40 mg via INTRAVENOUS

## 2020-12-12 MED ORDER — INSULIN REGULAR(HUMAN) IN NACL 100-0.9 UT/100ML-% IV SOLN
INTRAVENOUS | Status: DC
Start: 2020-12-12 — End: 2020-12-12

## 2020-12-12 MED ORDER — CYANOCOBALAMIN 500 MCG PO TABS
500.0000 ug | ORAL_TABLET | Freq: Every day | ORAL | Status: DC
  Filled 2020-12-12: qty 1

## 2020-12-12 MED ORDER — LACTATED RINGERS IV SOLN: INTRAVENOUS | Status: DC

## 2020-12-12 MED ORDER — LORAZEPAM 2 MG/ML IJ SOLN: 2.0000 mg | Freq: Once | INTRAMUSCULAR | Status: DC | PRN

## 2020-12-12 MED ORDER — DEXTROSE 50 % IV SOLN: 0.0000 mL | INTRAVENOUS | Status: DC | PRN

## 2020-12-12 MED ORDER — ADULT MULTIVITAMIN W/MINERALS CH
1.0000 | ORAL_TABLET | Freq: Every day | ORAL | Status: DC
  Filled 2020-12-12: qty 1

## 2020-12-12 MED ORDER — ATORVASTATIN CALCIUM 20 MG PO TABS
40.0000 mg | ORAL_TABLET | Freq: Every day | ORAL | Status: DC
  Administered 2020-12-12: 40 mg via ORAL
  Filled 2020-12-12: qty 2

## 2020-12-12 MED ORDER — FOLIC ACID 1 MG PO TABS
1.0000 mg | ORAL_TABLET | Freq: Every day | ORAL | Status: DC
  Filled 2020-12-12: qty 1

## 2020-12-12 MED ORDER — CHLORHEXIDINE GLUCONATE CLOTH 2 % EX PADS
6.0000 | MEDICATED_PAD | Freq: Every day | CUTANEOUS | Status: DC
  Administered 2020-12-12: 6 via TOPICAL

## 2020-12-12 MED ORDER — DILTIAZEM HCL ER COATED BEADS 240 MG PO CP24
240.0000 mg | ORAL_CAPSULE | Freq: Every day | ORAL | Status: DC
  Filled 2020-12-12 (×2): qty 1

## 2020-12-12 MED ORDER — GABAPENTIN 300 MG PO CAPS
300.0000 mg | ORAL_CAPSULE | Freq: Three times a day (TID) | ORAL | Status: DC
  Administered 2020-12-12 – 2020-12-13 (×2): 300 mg via ORAL
  Filled 2020-12-12 (×2): qty 1

## 2020-12-12 MED ORDER — LISINOPRIL 10 MG PO TABS
10.0000 mg | ORAL_TABLET | Freq: Every day | ORAL | Status: DC
  Filled 2020-12-12 (×2): qty 2

## 2020-12-12 MED ORDER — LACTATED RINGERS IV BOLUS: 20.0000 mL/kg | Freq: Once | INTRAVENOUS | Status: DC

## 2020-12-12 MED ORDER — DEXTROSE IN LACTATED RINGERS 5 % IV SOLN: INTRAVENOUS | Status: DC

## 2020-12-12 MED ORDER — ALBUTEROL SULFATE (2.5 MG/3ML) 0.083% IN NEBU: 2.5000 mg | INHALATION_SOLUTION | Freq: Four times a day (QID) | RESPIRATORY_TRACT | Status: DC | PRN

## 2020-12-12 MED ORDER — LORAZEPAM 2 MG PO TABS: 2.0000 mg | ORAL_TABLET | Freq: Once | ORAL | Status: DC

## 2020-12-12 MED ORDER — GABAPENTIN 100 MG PO CAPS: 100.0000 mg | ORAL_CAPSULE | Freq: Two times a day (BID) | ORAL | Status: DC

## 2020-12-12 MED ORDER — POTASSIUM CHLORIDE 10 MEQ/100ML IV SOLN
10.0000 meq | INTRAVENOUS | Status: AC
  Administered 2020-12-12: 10 meq via INTRAVENOUS

## 2020-12-12 NOTE — H&P (Signed)
History and Physical    Pamela Lane FBX:[id number] DOB: 12-13-1960 DOA: 12/12/2020  PCP: [facility]   Patient coming from: Home  I have personally briefly reviewed patient's old medical records in [facility]  Chief Complaint: Abdominal pain  HPI: Pamela Lane is a 60 y.o. female with medical history significant diabetes mellitus, medication noncompliance, polysubstance abuse who presents to the emergency room via EMS for evaluation of abdominal pain and poor oral intake for over a week.  She thinks it is a flareup of her pancreatitis.  She complains of nausea and multiple episodes of emesis and has not been able to take any of her medications including her insulin.  Abdominal pain is mostly in the periumbilical area, it is nonradiating and is associated with constipation, nausea and vomiting.  She states that her stools have been dark in color and is unsure if she is on any blood thinners. She denies having any fever, no chills, no cough, no dizziness, no lightheadedness, no palpitations, no diaphoresis, no urinary symptoms, no headache, no blurred vision, no focal deficits. Venous blood gas 7.2 08/02/55/17.3/81.9 Labs show sodium 131, potassium 4.8, chloride 98, bicarb 10, glucose 559, BUN 24, creatinine 1.39, calcium 9.2, anion gap 23, alkaline phosphatase 110, albumin 4.4, AST 17, ALT 16, total protein 8.3, white count 8.8, hemoglobin 13.0, hematocrit 37.3, MCV 89.7, RDW 12.2, platelet count 283, PT 13.4, INR 1.0 Respiratory viral panel is pending Twelve-lead EKG reviewed by me shows sinus tachycardia.    ED Course: Patient is a 60 year old African-American female with a history of diabetes mellitus, medication noncompliance, polysubstance abuse which includes crack cocaine use, marijuana, nicotine and alcohol who presents to the ER for evaluation of 1 week history of nausea, vomiting and abdominal pain. Patient is noted to be in DKA and has been started  on insulin drip and will be admitted to the hospital for further evaluation.  Review of Systems: As per HPI otherwise all other systems reviewed and negative.    Past Medical History:  Diagnosis Date  . Asthma   . Clostridium difficile colitis   . Diabetes mellitus without complication (HCC)   . Hypertension   . Mental disorder    pt reports 'I have all of them mental disorders'    Past Surgical History:  Procedure Laterality Date  . ABDOMINAL HYSTERECTOMY       reports that she has been smoking cigarettes. She has been smoking about 0.25 packs per day. She has never used smokeless tobacco. She reports current alcohol use of about 40.0 standard drinks of alcohol per week. She reports current drug use. Drugs: Marijuana and Cocaine.  No Known Allergies  Family History  Problem Relation Age of Onset  . Hypertension Mother       Prior to Admission medications   Medication Sig Start Date End Date Taking? Authorizing Provider  albuterol (PROVENTIL HFA;VENTOLIN HFA) 108 (90 Base) MCG/ACT inhaler Inhale 2 puffs into the lungs every 6 (six) hours as needed for wheezing or shortness of breath. 02/18/18   Auburn Bilberry, MD  atorvastatin (LIPITOR) 40 MG tablet TAKE 1 TABLET BY MOUTH AT BEDTIME. 08/29/20 08/29/21  Darlin Drop, DO  buPROPion [facility] SR) 150 MG 12 hr tablet Take 150 mg by mouth 2 (two) times daily. 10/01/20   [provider]  diltiazem (CARDIZEM CD) 240 MG 24 hr capsule Take 1 capsule (240 mg total) by mouth daily. 10/12/20   Alford Highland, MD  folic acid (FOLVITE)  1 MG tablet Take 1 tablet (1 mg total) by mouth daily. 03/08/18   Gouru, Deanna Artis, MD  gabapentin (NEURONTIN) 100 MG capsule TAKE 1 CAPSULE BY MOUTH 2 TIMES DAILY. 08/29/20 08/29/21  Darlin Drop, DO  insulin glargine (LANTUS) 100 UNIT/ML Solostar Pen Inject 36 Units into the skin daily. 11/18/20 02/01/21  Tresa Moore, MD  lisinopril (ZESTRIL) 10 MG tablet Take 10 mg by mouth daily. 10/01/20    [provider]  metFORMIN (GLUCOPHAGE-XR) 500 MG 24 hr tablet TAKE 2 TABLETS (1,000 MG TOTAL) BY MOUTH DAILY WITH BREAKFAST. 08/29/20 08/29/21  Darlin Drop, DO  Multiple Vitamin (MULTIVITAMIN WITH MINERALS) TABS tablet Take 1 tablet by mouth daily. 03/08/18   Gouru, Deanna Artis, MD  nicotine (NICODERM CQ - DOSED IN MG/24 HOURS) 21 mg/24hr patch Place 1 patch (21 mg total) onto the skin daily. 11/18/20   Sreenath, Sudheer B, MD  NOVOLOG FLEXPEN 100 UNIT/ML FlexPen INJECT 10 UNITS INTO THE SKIN 3 TIMES DAILY WITH MEALS. 08/29/20 08/29/21  Darlin Drop, DO  omeprazole (PRILOSEC) 20 MG capsule Take 1 capsule by mouth daily. 10/11/20   [provider]  thiamine 100 MG tablet Take 1 tablet (100 mg total) by mouth daily. 10/11/20   Alford Highland, MD  vitamin B-12 (CYANOCOBALAMIN) 500 MCG tablet Take 500 mcg by mouth daily.    [provider]    Physical Exam: Vitals:   12/12/20 1216 12/12/20 1217  BP: (!) 157/89   Pulse: (!) 111   Resp: 17   Temp: (!) 97.5 F (36.4 C)   TempSrc: Oral   SpO2: 99%   Weight:  80 kg  Height:  5\' 2"  (1.575 m)     Vitals:   12/12/20 1216 12/12/20 1217  BP: (!) 157/89   Pulse: (!) 111   Resp: 17   Temp: (!) 97.5 F (36.4 C)   TempSrc: Oral   SpO2: 99%   Weight:  80 kg  Height:  5\' 2"  (1.575 m)      Constitutional: Alert and oriented x 3 .  Anxious and agitated in the ER HEENT:      Head: Normocephalic and atraumatic.         Eyes: PERLA, EOMI, Conjunctivae are normal. Sclera is non-icteric.       Mouth/Throat: Mucous membranes are  dry.       Neck: Supple with no signs of meningismus. Cardiovascular:  Tachycardic. No murmurs, gallops, or rubs. 2+ symmetrical distal pulses are present . No JVD. No LE edema Respiratory: Respiratory effort normal .Lungs sounds clear bilaterally. No wheezes, crackles, or rhonchi.  Gastrointestinal: Soft, non tender, and non distended with positive bowel sounds.  Genitourinary: No CVA  tenderness. Musculoskeletal: Nontender with normal range of motion in all extremities. No cyanosis, or erythema of extremities. Neurologic:  Face is symmetric. Moving all extremities. No gross focal neurologic deficits  Skin: Skin is warm, dry.  No rash or ulcers Psychiatric: Anxious and agitated   Labs on Admission: I have personally reviewed following labs and imaging studies  CBC: Recent Labs  Lab 12/12/20 1231  WBC 8.8  HGB 13.0  HCT 37.3  MCV 89.7  PLT 283   Basic Metabolic Panel: Recent Labs  Lab 12/12/20 1231  NA 131*  K 4.8  CL 98  CO2 10*  GLUCOSE 559*  BUN 24*  CREATININE 1.39*  CALCIUM 9.2   GFR: Estimated Creatinine Clearance: 42.7 mL/min (A) (by C-G formula based on SCr of 1.39 mg/dL (H)).  Liver Function Tests: Recent Labs  Lab 12/12/20 1231  AST 17  ALT 16  ALKPHOS 110  BILITOT 2.6*  PROT 8.3*  ALBUMIN 4.4   No results for input(s): LIPASE, AMYLASE in the last 168 hours. No results for input(s): AMMONIA in the last 168 hours. Coagulation Profile: Recent Labs  Lab 12/12/20 1338  INR 1.0   Cardiac Enzymes: No results for input(s): CKTOTAL, CKMB, CKMBINDEX, TROPONINI in the last 168 hours. BNP (last 3 results) No results for input(s): PROBNP in the last 8760 hours. HbA1C: No results for input(s): HGBA1C in the last 72 hours. CBG: Recent Labs  Lab 12/12/20 1437  GLUCAP 588*   Lipid Profile: No results for input(s): CHOL, HDL, LDLCALC, TRIG, CHOLHDL, LDLDIRECT in the last 72 hours. Thyroid Function Tests: No results for input(s): TSH, T4TOTAL, FREET4, T3FREE, THYROIDAB in the last 72 hours. Anemia Panel: No results for input(s): VITAMINB12, FOLATE, FERRITIN, TIBC, IRON, RETICCTPCT in the last 72 hours. Urine analysis:    Component Value Date/Time   COLORURINE YELLOW (A) 11/15/2020 2323   APPEARANCEUR CLOUDY (A) 11/15/2020 2323   LABSPEC 1.023 11/15/2020 2323   PHURINE 6.0 11/15/2020 2323   GLUCOSEU >=500 (A) 11/15/2020 2323    HGBUR NEGATIVE 11/15/2020 2323   BILIRUBINUR NEGATIVE 11/15/2020 2323   KETONESUR 20 (A) 11/15/2020 2323   PROTEINUR 30 (A) 11/15/2020 2323   NITRITE NEGATIVE 11/15/2020 2323   LEUKOCYTESUR NEGATIVE 11/15/2020 2323    Radiological Exams on Admission: No results found.   Assessment/Plan Principal Problem:   DKA (diabetic ketoacidosis) (HCC) Active Problems:   Hyponatremia   Bipolar disorder (HCC)   Essential hypertension   Tobacco abuse   Cocaine abuse (HCC)    DKA Most likely secondary to medication noncompliance  Will place patient on regular insulin infusion Aggressive IV fluid hydration Check and supplement electrolytes Diabetic education and nutrition evaluation   Bipolar disorder Continue bupropion  Psychiatry consultation was requested in the ER because patient was extremely agitated and anxious. 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  DVT prophylaxis: Lovenox Code Status: full code Family Communication: Greater than 50% of time was spent discussing patient's condition and plan of care with her at the bedside.  All questions and concerns have been addressed.  She verbalizes understanding and agrees with the plan. Disposition Plan: Back to previous home environment Consults called: none Status: At the time of admission, it appears that the  appropriate admission status for this patient is inpatient. This is judged to be reasonable and necessary in order to provide the required intensity of service to ensure the patient's safety given the presenting symptoms, physical exam findings, and initial radiographic and laboratory data in the context of their comorbid conditions. Patient requires inpatient status due to high intensity of service, high risk for further deterioration and high frequency of surveillance required.    Lucile Shutters MD [facility]     12/12/2020, 3:29  PM

## 2020-12-12 NOTE — ED Notes (Signed)
Patients husband updated as to patient's status.

## 2020-12-12 NOTE — ED Notes (Signed)
Patient called RN "bitch", RN asked patient not to speak to her using those words and pt states "I can say whatever I want, I have mental heath issues bitch".

## 2020-12-12 NOTE — Consult Note (Signed)
Client presented with a high glucose level, history of DKA, and positive for cocaine.  She became upset earlier over the remote control the tv and required IV Ativan to calm by the EDP.  This provider went to assess her and she is currently sleeping, will continue to try and assess when she awakens.  Pamela Lane,PMHNP
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
## 2020-12-12 NOTE — ED Provider Notes (Signed)
Patient screaming at staff cursing, repeatedly saying "I have 15 felonies "apparently because she is frustrated because the TV remote will not work   Jene Every, MD 12/12/20 1416
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
## 2020-12-12 NOTE — ED Triage Notes (Signed)
Pt BIBA from home for abd pain and N/V x 2 weeks. Pt states she has not been able to eat x 2 weeks, so she has not taken any of her medications. EMS reports CBG was >500 x weeks. Pt reports black tarry stools x several months.

## 2020-12-12 NOTE — ED Provider Notes (Addendum)
Patient has agreed to have IV medication to help her calm down.  We will give 2 mg of IV Ativan because of her overly aggressive behavior and significant anxiety.  Have also consulted psychiatry   Jene Every, MD 12/12/20 1453    Jene Every, MD 12/12/20 1505

## 2020-12-12 NOTE — ED Provider Notes (Signed)
[facility] Emergency Department Provider Note   ____________________________________________    I have reviewed the triage vital signs and the nursing notes.   HISTORY  Chief Complaint Abdominal Pain, Nausea, Melena, and Hyperglycemia     HPI Pamela Lane is a 60 y.o. female with a history of diabetes, bipolar disorder who presents with complaints of mild abdominal cramping, nausea and vomiting and dark stools.  Patient reports her stool today have been black and she had some constipation as well.  She reports this has been the case frequently over the last 6 months.  No history of stomach ulcer, not on blood thinners.  No fevers or chills reported.  Denies using Pepto-Bismol or similar medications  Past Medical History:  Diagnosis Date  . Asthma   . Clostridium difficile colitis   . Diabetes mellitus without complication (HCC)   . Hypertension   . Mental disorder    pt reports 'I have all of them mental disorders'    Patient Active Problem List   Diagnosis Date Noted  . Cocaine abuse (HCC)   . Acute respiratory distress   . SVT (supraventricular tachycardia) (HCC)   . Leukocytosis   . Hypokalemia   . Hypophosphatemia   . DKA (diabetic ketoacidosis) (HCC) 08/27/2020  . Tobacco abuse 10/04/2019  . DKA, type 2 (HCC) 10/02/2019  . Essential hypertension 10/02/2019  . Bipolar disorder (HCC) 01/17/2019  . Acute pancreatitis 05/15/2016  . AKI (acute kidney injury) (HCC)   . Diabetic ketoacidosis without coma associated with type 2 diabetes mellitus (HCC)   . Hyperglycemia due to type 2 diabetes mellitus (HCC) 01/13/2016  . UTI (urinary tract infection) 01/13/2016  . DKA (diabetic ketoacidoses) 01/08/2016  . Hyponatremia 01/08/2016  . Dehydration 01/08/2016  . Urinary tract infection 01/08/2016  . Upper abdominal pain 01/08/2016  . Nausea & vomiting 01/08/2016    Past Surgical History:  Procedure Laterality Date  . ABDOMINAL  HYSTERECTOMY      Prior to Admission medications   Medication Sig Start Date End Date Taking? Authorizing Provider  albuterol (PROVENTIL HFA;VENTOLIN HFA) 108 (90 Base) MCG/ACT inhaler Inhale 2 puffs into the lungs every 6 (six) hours as needed for wheezing or shortness of breath. 02/18/18   Auburn Bilberry, MD  atorvastatin (LIPITOR) 40 MG tablet TAKE 1 TABLET BY MOUTH AT BEDTIME. 08/29/20 08/29/21  Darlin Drop, DO  buPROPion [facility] SR) 150 MG 12 hr tablet Take 150 mg by mouth 2 (two) times daily. 10/01/20   [provider]  diltiazem (CARDIZEM CD) 240 MG 24 hr capsule Take 1 capsule (240 mg total) by mouth daily. 10/12/20   Alford Highland, MD  folic acid (FOLVITE) 1 MG tablet Take 1 tablet (1 mg total) by mouth daily. 03/08/18   Gouru, Deanna Artis, MD  gabapentin (NEURONTIN) 100 MG capsule TAKE 1 CAPSULE BY MOUTH 2 TIMES DAILY. 08/29/20 08/29/21  Darlin Drop, DO  insulin glargine (LANTUS) 100 UNIT/ML Solostar Pen Inject 36 Units into the skin daily. 11/18/20 02/01/21  Tresa Moore, MD  lisinopril (ZESTRIL) 10 MG tablet Take 10 mg by mouth daily. 10/01/20   [provider]  metFORMIN (GLUCOPHAGE-XR) 500 MG 24 hr tablet TAKE 2 TABLETS (1,000 MG TOTAL) BY MOUTH DAILY WITH BREAKFAST. 08/29/20 08/29/21  Darlin Drop, DO  Multiple Vitamin (MULTIVITAMIN WITH MINERALS) TABS tablet Take 1 tablet by mouth daily. 03/08/18   Gouru, Deanna Artis, MD  nicotine (NICODERM CQ - DOSED IN MG/24 HOURS) 21 mg/24hr patch Place  1 patch (21 mg total) onto the skin daily. 11/18/20   Sreenath, Sudheer B, MD  NOVOLOG FLEXPEN 100 UNIT/ML FlexPen INJECT 10 UNITS INTO THE SKIN 3 TIMES DAILY WITH MEALS. 08/29/20 08/29/21  Darlin Drop, DO  omeprazole (PRILOSEC) 20 MG capsule Take 1 capsule by mouth daily. 10/11/20   [provider]  thiamine 100 MG tablet Take 1 tablet (100 mg total) by mouth daily. 10/11/20   Alford Highland, MD  vitamin B-12 (CYANOCOBALAMIN) 500 MCG tablet Take 500 mcg by mouth daily.     [provider]     Allergies Patient has no known allergies.  Family History  Problem Relation Age of Onset  . Hypertension Mother     Social History Social History   Tobacco Use  . Smoking status: Current Every Day Smoker    Packs/day: 0.25    Types: Cigarettes  . Smokeless tobacco: Never Used  Vaping Use  . Vaping Use: Never used  Substance Use Topics  . Alcohol use: Yes    Alcohol/week: 40.0 standard drinks    Types: 40 Cans of beer per week    Comment: 3 big bottles of liquor  . Drug use: Yes    Types: Marijuana, Cocaine    Review of Systems  Constitutional: No fever/chills Eyes: No visual changes.  ENT: No sore throat. Cardiovascular: Denies chest pain. Respiratory: Denies shortness of breath. Gastrointestinal: As above Genitourinary: Negative for dysuria. Musculoskeletal: Negative for back pain. Skin: Negative for rash. Neurological: Negative for headaches or weakness   ____________________________________________   PHYSICAL EXAM:  VITAL SIGNS: ED Triage Vitals  Enc Vitals Group     BP 12/12/20 1216 (!) 157/89     Pulse Rate 12/12/20 1216 (!) 111     Resp 12/12/20 1216 17     Temp 12/12/20 1216 (!) 97.5 F (36.4 C)     Temp Source 12/12/20 1216 Oral     SpO2 12/12/20 1216 99 %     Weight 12/12/20 1217 80 kg (176 lb 5.9 oz)     Height 12/12/20 1217 1.575 m (5\' 2" )     Head Circumference --      Peak Flow --      Pain Score --      Pain Loc --      Pain Edu? --      Excl. in GC? --     Constitutional: Alert and oriented.  Eyes: Conjunctivae are normal.  . Nose: No congestion/rhinnorhea. Mouth/Throat: Mucous membranes are moist.   Neck:  Painless ROM Cardiovascular: Tachycardia, regular rhythm. Grossly normal heart sounds.  Good peripheral circulation. Respiratory: Normal respiratory effort.  No retractions. Lungs CTAB. Gastrointestinal: Soft and nontender. No distention.  No CVA tenderness.  Musculoskeletal: No lower  extremity tenderness nor edema.  Warm and well perfused Neurologic:  Normal speech and language. No gross focal neurologic deficits are appreciated.  Skin:  Skin is warm, dry and intact. No rash noted. Psychiatric: Mood and affect are normal. Speech and behavior are normal.  ____________________________________________   LABS (all labs ordered are listed, but only abnormal results are displayed)  Labs Reviewed  COMPREHENSIVE METABOLIC PANEL - Abnormal; Notable for the following components:      Result Value   Sodium 131 (*)    CO2 10 (*)    Glucose, Bld 559 (*)    BUN 24 (*)    Creatinine, Ser 1.39 (*)    Total Protein 8.3 (*)    Total Bilirubin 2.6 (*)  GFR, Estimated 44 (*)    Anion gap 23 (*)    All other components within normal limits  SARS CORONAVIRUS 2 (TAT 6-24 HRS)  CBC  PROTIME-INR  APTT  BETA-HYDROXYBUTYRIC ACID  BETA-HYDROXYBUTYRIC ACID  BLOOD GAS, VENOUS  TYPE AND SCREEN  TYPE AND SCREEN   ____________________________________________  EKG  ED ECG REPORT I, Jene Every, the attending physician, personally viewed and interpreted this ECG.  Date: 12/12/2020  Rhythm: Sinus tachycardia QRS Axis: normal Intervals: normal ST/T Wave abnormalities: normal Narrative Interpretation: no evidence of acute ischemia  ____________________________________________  RADIOLOGY  None ____________________________________________   PROCEDURES  Procedure(s) performed: No  Procedures   Critical Care performed: yes  CRITICAL CARE Performed by: Jene Every   Total critical care time: 30 minutes  Critical care time was exclusive of separately billable procedures and treating other patients.  Critical care was necessary to treat or prevent imminent or life-threatening deterioration.  Critical care was time spent personally by me on the following activities: development of treatment plan with patient and/or surrogate as well as nursing, discussions with  consultants, evaluation of patient's response to treatment, examination of patient, obtaining history from patient or surrogate, ordering and performing treatments and interventions, ordering and review of laboratory studies, ordering and review of radiographic studies, pulse oximetry and re-evaluation of patient's condition.  ____________________________________________   INITIAL IMPRESSION / ASSESSMENT AND PLAN / ED COURSE  Pertinent labs & imaging results that were available during my care of the patient were reviewed by me and considered in my medical decision making (see chart for details).  Patient presents with complaints of dark stools, possibly elevated glucose as above.  She is tachycardic here.  Raising suspicion for possible DKA, GI bleed is a possibility as well although less likely given HPI.  Pending labs CBG, imaging.  Patient is being monitored  Patient with glucose of 560 with bicarb of 10 and anion gap of 23 consistent with DKA  Fluids, insulin drip ordered.  Patient's hemoglobin is normal  Will consult the hospitalist service for admission    ____________________________________________   FINAL CLINICAL IMPRESSION(S) / ED DIAGNOSES  Final diagnoses:  Diabetic ketoacidosis without coma associated with type 2 diabetes mellitus (HCC)        Note:  This document was prepared using Dragon voice recognition software and may include unintentional dictation errors.   Jene Every, MD 12/12/20 1346

## 2020-12-12 NOTE — ED Notes (Signed)
Attempted to find remote for TV per pt request, unable to find working remote and unsuccessful at reprogramming remote, pt became verbally aggressive and started calling me "bitch" and "cracker". MD and charge RN aware.

## 2020-12-12 NOTE — ED Notes (Signed)
Patient is resting comfortably. Call light in reach. Remote at bedside. Fall precautions in place.

## 2020-12-12 NOTE — Progress Notes (Signed)
Inpatient Diabetes Program Recommendations  AACE/ADA: New Consensus Statement on Inpatient Glycemic Control (2015)  Target Ranges:  Prepandial:   less than 140 mg/dL      Peak postprandial:   less than 180 mg/dL (1-2 hours)      Critically ill patients:  140 - 180 mg/dL   Lab Results  Component Value Date   GLUCAP 588 (HH) 12/12/2020   HGBA1C 14.3 (H) 11/16/2020    Review of Glycemic Control  Diabetes history: DM2 Outpatient Diabetes medications: Lantus 36 units daily, Novolog 10 units TID with meals, Metformin XR 1000 mg QAM Current orders for Inpatient glycemic control: IV insulin  Inpatient Diabetes Program Recommendations:   Insulin: Once acidosis is resolved and provider is ready to transition from IV to SQ insulin, please consider ordering Lantus 24 units Q24H, CBGs Q4H, Novolog 0-20 units Q4H, and if diet advanced Novolog 5 units TID with meals for meal coverage if patient eats at least 50% of meals.  NOTE: Noted consult for Diabetes Coordinator. Diabetes Coordinator is not on campus over the weekend but available by pager from 8am to 5pm for questions or concerns. Chart reviewed. Patient was recently inpatient 10/07/20-10/11/20 and was seen by inpatient diabetes coordinator on 10/10/20. Patient admitted with today with DKA with initial glucose of 559 mg/dl and was started on IV insulin.   Thank you, Pamela Lane. Shonia Skilling, RN, MSN, CDE  Diabetes Coordinator Inpatient Glycemic Control Team Team Pager [phone] (8am-5pm) 12/12/2020 2:47 PM

## 2020-12-12 NOTE — ED Notes (Signed)
This RN was sitting at charge desk when yelling and cursing was heard. Pt found to be sitting on the edge of her bed yelling and cursing at staff members. This RN told patient this type of behavior towards staff would not be tolerated. Pt continues to yell and curse at staff standing outside of room. Pt again informed this behavior would not be tolerated and if she continued to be verbally abusive to staff she would be asked to leave to which pt stated "I ain't going no fucking where".  [place] [employer] called and made aware of situation and officer will be coming to ED to speak with patient.

## 2020-12-13 ENCOUNTER — Encounter: Payer: Self-pay | Admitting: Internal Medicine

## 2020-12-13 DIAGNOSIS — E1110 Type 2 diabetes mellitus with ketoacidosis without coma: Secondary | ICD-10-CM | POA: Diagnosis not present

## 2020-12-13 DIAGNOSIS — F3130 Bipolar disorder, current episode depressed, mild or moderate severity, unspecified: Secondary | ICD-10-CM | POA: Diagnosis not present

## 2020-12-13 DIAGNOSIS — I10 Essential (primary) hypertension: Secondary | ICD-10-CM | POA: Diagnosis not present

## 2020-12-13 DIAGNOSIS — Z720 Tobacco use: Secondary | ICD-10-CM

## 2020-12-13 DIAGNOSIS — F1410 Cocaine abuse, uncomplicated: Secondary | ICD-10-CM

## 2020-12-13 DIAGNOSIS — E871 Hypo-osmolality and hyponatremia: Secondary | ICD-10-CM

## 2020-12-13 LAB — GLUCOSE, CAPILLARY
Glucose-Capillary: 148 mg/dL — ABNORMAL HIGH (ref 70–99)
Glucose-Capillary: 159 mg/dL — ABNORMAL HIGH (ref 70–99)
Glucose-Capillary: 171 mg/dL — ABNORMAL HIGH (ref 70–99)
Glucose-Capillary: 174 mg/dL — ABNORMAL HIGH (ref 70–99)
Glucose-Capillary: 181 mg/dL — ABNORMAL HIGH (ref 70–99)
Glucose-Capillary: 196 mg/dL — ABNORMAL HIGH (ref 70–99)
Glucose-Capillary: 198 mg/dL — ABNORMAL HIGH (ref 70–99)
Glucose-Capillary: 212 mg/dL — ABNORMAL HIGH (ref 70–99)
Glucose-Capillary: 214 mg/dL — ABNORMAL HIGH (ref 70–99)

## 2020-12-13 LAB — SARS CORONAVIRUS 2 (TAT 6-24 HRS): SARS Coronavirus 2: NEGATIVE

## 2020-12-13 LAB — BASIC METABOLIC PANEL
Anion gap: 9 (ref 5–15)
BUN: 21 mg/dL — ABNORMAL HIGH (ref 6–20)
CO2: 21 mmol/L — ABNORMAL LOW (ref 22–32)
Calcium: 9.3 mg/dL (ref 8.9–10.3)
Chloride: 107 mmol/L (ref 98–111)
Creatinine, Ser: 0.93 mg/dL (ref 0.44–1.00)
GFR, Estimated: >60 mL/min (ref >60)
Glucose, Bld: 196 mg/dL — ABNORMAL HIGH (ref 70–99)
Potassium: 3.5 mmol/L (ref 3.5–5.1)
Sodium: 137 mmol/L (ref 135–145)

## 2020-12-13 LAB — BETA-HYDROXYBUTYRIC ACID: Beta-Hydroxybutyric Acid: 0.86 mmol/L — ABNORMAL HIGH (ref 0.05–0.27)

## 2020-12-13 MED ORDER — INSULIN GLARGINE 100 UNIT/ML ~~LOC~~ SOLN
18.0000 [IU] | Freq: Every day | SUBCUTANEOUS | Status: DC
  Administered 2020-12-13: 18 [IU] via SUBCUTANEOUS
  Filled 2020-12-13: qty 0.18

## 2020-12-13 MED ORDER — PANTOPRAZOLE SODIUM 40 MG PO TBEC: 40.0000 mg | DELAYED_RELEASE_TABLET | Freq: Every day | ORAL | Status: DC

## 2020-12-13 MED ORDER — INSULIN ASPART 100 UNIT/ML IJ SOLN
5.0000 [IU] | Freq: Three times a day (TID) | INTRAMUSCULAR | Status: DC
  Administered 2020-12-13: 5 [IU] via SUBCUTANEOUS

## 2020-12-13 MED ORDER — INSULIN ASPART 100 UNIT/ML IJ SOLN: 0.0000 [IU] | INTRAMUSCULAR | Status: DC

## 2020-12-13 MED ORDER — INSULIN ASPART 100 UNIT/ML IJ SOLN
0.0000 [IU] | Freq: Three times a day (TID) | INTRAMUSCULAR | Status: DC
  Administered 2020-12-13: 4 [IU] via SUBCUTANEOUS

## 2020-12-13 NOTE — Progress Notes (Addendum)
Patient alert and oriented x4. She has been ambulating around room and came out of room stated she was ready for discharge and wanted to walk around unit. Instructed to patient not to leave unit due to paperwork being prepared and IV in place. Will give discharge instructions and gather items.  Patient refused wheelchair, walked out with husband.

## 2020-12-13 NOTE — Plan of Care (Signed)
  Problem: Clinical Measurements: Goal: Ability to maintain clinical measurements within normal limits will improve Outcome: Progressing Goal: Will remain free from infection Outcome: Progressing   Problem: Activity: Goal: Risk for activity intolerance will decrease Outcome: Progressing   Problem: Coping: Goal: Level of anxiety will decrease Outcome: Progressing   Problem: Pain Managment: Goal: General experience of comfort will improve Outcome: Progressing   

## 2020-12-13 NOTE — Discharge Summary (Signed)
Physician Discharge Summary  Pamela Lane DTO:[id number] DOB: 1961-03-21 DOA: 12/12/2020  PCP: [facility]  Admit date: 12/12/2020 Discharge date: 12/13/2020  Admitted From: Home Disposition: Home  Recommendations for Outpatient Follow-up:  1. Follow up with PCP in 1-2 weeks 2. Please obtain BMP/CBC in one week 3. Please follow up on the following pending results: None  Home Health: No Equipment/Devices: None Discharge Condition: Stable CODE STATUS: Full Diet recommendation: Heart Healthy / Carb Modified   Brief/Interim Summary: Pamela Lane is a 60 y.o. female with medical history significantdiabetes mellitus, medication noncompliance, polysubstance abuse who presents to the emergency room via EMS for evaluation of abdominal pain and poor oral intake for over a week.  She thinks it is a flareup of her pancreatitis.  She complains of nausea and multiple episodes of emesis and has not been able to take any of her medications including her insulin.  Abdominal pain is mostly in the periumbilical area, it is nonradiating and is associated with constipation, nausea and vomiting.    She was found to be in DKA with significant acidosis and markedly elevated beta hydroxybutyric acid, she was started on Endo tool, responded very well and pretty quickly her gap closed, acidosis improved.  She was later transitioned to basal and short-acting.  Patient was instructed to keep checking her blood glucose level and to not miss her insulin doses as she was not using them for the past 2 weeks as was not feeling well.  Her nausea, vomiting and abdominal pain resolved.  She was able to tolerate diet before discharge.  She was also found to have pseudohyponatremia secondary to hyperglycemia.  Which resolved with resolution of DKA.  She will continue with rest of her home medications and follow-up with her providers.  Discharge Diagnoses:  Principal Problem:   DKA (diabetic  ketoacidosis) (HCC) Active Problems:   Hyponatremia   Bipolar disorder (HCC)   Essential hypertension   Tobacco abuse   Cocaine abuse [facility])   Discharge Instructions  Discharge Instructions    Diet - low sodium heart healthy   Complete by: As directed    Discharge instructions   Complete by: As directed    It was pleasure taking care of you. Please continue taking all your medications including insulin and keep checking your blood glucose level regularly. Keep yourself well-hydrated. Follow-up closely with your primary care doctor for further management.   Increase activity slowly   Complete by: As directed      Allergies as of 12/13/2020   No Known Allergies     Medication List    TAKE these medications   albuterol 108 (90 Base) MCG/ACT inhaler Commonly known as: VENTOLIN HFA Inhale 2 puffs into the lungs every 6 (six) hours as needed for wheezing or shortness of breath.   atorvastatin 40 MG tablet Commonly known as: LIPITOR TAKE 1 TABLET BY MOUTH AT BEDTIME.   buPROPion 150 MG 12 hr tablet Commonly known as: WELLBUTRIN SR Take 150 mg by mouth 2 (two) times daily.   diltiazem 240 MG 24 hr capsule Commonly known as: CARDIZEM CD Take 1 capsule (240 mg total) by mouth daily.   folic acid 1 MG tablet Commonly known as: FOLVITE Take 1 tablet (1 mg total) by mouth daily.   gabapentin 100 MG capsule Commonly known as: NEURONTIN TAKE 1 CAPSULE BY MOUTH 2 TIMES DAILY. What changed: how much to take   insulin glargine 100 UNIT/ML Solostar Pen Commonly known as: LANTUS Inject  36 Units into the skin daily.   lisinopril 10 MG tablet Commonly known as: ZESTRIL Take 10 mg by mouth daily.   metFORMIN 500 MG 24 hr tablet Commonly known as: GLUCOPHAGE-XR TAKE 2 TABLETS (1,000 MG TOTAL) BY MOUTH DAILY WITH BREAKFAST.   multivitamin with minerals Tabs tablet Take 1 tablet by mouth daily.   nicotine 21 mg/24hr patch Commonly known as: NICODERM CQ - dosed in mg/24  hours Place 1 patch (21 mg total) onto the skin daily.   NovoLOG FlexPen 100 UNIT/ML FlexPen Generic drug: insulin aspart INJECT 10 UNITS INTO THE SKIN 3 TIMES DAILY WITH MEALS.   omeprazole 20 MG capsule Commonly known as: PRILOSEC Take 1 capsule by mouth daily.   thiamine 100 MG tablet Take 1 tablet (100 mg total) by mouth daily.   vitamin B-12 500 MCG tablet Commonly known as: CYANOCOBALAMIN Take 500 mcg by mouth daily.       Follow-up Information    [facility]. Schedule an appointment as soon as possible for a visit.   Specialty: General Practice Contact information: [address] [place] [address] [phone]              No Known Allergies  Consultations:  None  Procedures/Studies: CT ABDOMEN PELVIS WO CONTRAST  Result Date: 11/16/2020 CLINICAL DATA:  60 year old female with abdominal pain. EXAM: CT ABDOMEN AND PELVIS WITHOUT CONTRAST TECHNIQUE: Multidetector CT imaging of the abdomen and pelvis was performed following the standard protocol without IV contrast. COMPARISON:  CT abdomen pelvis dated 12/28/2018. FINDINGS: Evaluation of this exam is limited in the absence of intravenous contrast as well as due to respiratory motion artifact. Lower chest: The visualized lung bases are clear. There is coronary vascular calcification. No intra-abdominal free air or free fluid. Hepatobiliary: No focal liver abnormality is seen. No gallstones, gallbladder wall thickening, or biliary dilatation. Pancreas: Unremarkable. No pancreatic ductal dilatation or surrounding inflammatory changes. Spleen: Normal in size without focal abnormality. Adrenals/Urinary Tract: The adrenal glands unremarkable. The kidneys, visualized ureters, and urinary bladder appear unremarkable. Stomach/Bowel: There is no bowel obstruction or active inflammation. The appendix is normal. Vascular/Lymphatic: Moderate aortoiliac atherosclerotic disease. The IVC is unremarkable. No  portal venous gas. There is no adenopathy. Reproductive: Hysterectomy.  No adnexal masses. Other: None Musculoskeletal: Degenerative changes of the spine. No acute osseous pathology. IMPRESSION: 1. No acute intra-abdominal or pelvic pathology. No bowel obstruction. Normal appendix. 2. Aortic Atherosclerosis (ICD10-I70.0). Electronically Signed   By: Elgie Collard M.D.   On: 11/16/2020 01:31   DG Chest Port 1 View  Result Date: 11/15/2020 CLINICAL DATA:  Possible sepsis EXAM: PORTABLE CHEST 1 VIEW COMPARISON:  10/08/2020 FINDINGS: The heart size and mediastinal contours are within normal limits. Both lungs are clear. The visualized skeletal structures are unremarkable. IMPRESSION: No active disease. Electronically Signed   By: Alcide Clever M.D.   On: 11/15/2020 23:39     Subjective: Patient was seen and examined today.  No complaints.  No nausea, vomiting or diarrhea.  No abdominal pain.  Tolerating diet very well and wants to go home.  Discharge Exam: Vitals:   12/13/20 1025 12/13/20 1238  BP: (!) 151/88 (!) 155/83  Pulse: (!) 109 (!) 109  Resp: 17 17  Temp: 97.6 F (36.4 C) 97.8 F (36.6 C)  SpO2: 100% 100%   Vitals:   12/13/20 0826 12/13/20 0933 12/13/20 1025 12/13/20 1238  BP: (!) 152/79 (!) 94/56 (!) 151/88 (!) 155/83  Pulse: (!) 113 (!) 110 Marland Kitchen)  109 (!) 109  Resp: (!) 28 17 17 17   Temp: 98.1 F (36.7 C)  97.6 F (36.4 C) 97.8 F (36.6 C)  TempSrc: Oral  Oral Oral  SpO2: 98% 95% 100% 100%  Weight:      Height:        General: Pt is alert, awake, not in acute distress Cardiovascular: RRR, S1/S2 +, no rubs, no gallops Respiratory: CTA bilaterally, no wheezing, no rhonchi Abdominal: Soft, NT, ND, bowel sounds + Extremities: no edema, no cyanosis   The results of significant diagnostics from this hospitalization (including imaging, microbiology, ancillary and laboratory) are listed below for reference.    Microbiology: Recent Results (from the past 240 hour(s))  SARS  CORONAVIRUS 2 (TAT 6-24 HRS) Nasopharyngeal Nasopharyngeal Swab     Status: None   Collection Time: 12/12/20  2:35 PM   Specimen: Nasopharyngeal Swab  Result Value Ref Range Status   SARS Coronavirus 2 NEGATIVE NEGATIVE Final    Comment: (NOTE) SARS-CoV-2 target nucleic acids are NOT DETECTED.  The SARS-CoV-2 RNA is generally detectable in upper and lower respiratory specimens during the acute phase of infection. Negative results do not preclude SARS-CoV-2 infection, do not rule out co-infections with other pathogens, and should not be used as the sole basis for treatment or other patient management decisions. Negative results must be combined with clinical observations, patient history, and epidemiological information. The expected result is Negative.  Fact Sheet for Patients: HairSlick.nohttps://www.fda.gov/media/138098/download  Fact Sheet for Healthcare Providers: quierodirigir.comhttps://www.fda.gov/media/138095/download  This test is not yet approved or cleared by the [place] FDA and  has been authorized for detection and/or diagnosis of SARS-CoV-2 by FDA under an Emergency Use Authorization (EUA). This EUA will remain  in effect (meaning this test can be used) for the duration of the COVID-19 declaration under Se ction 564(b)(1) of the Act, 21 U.S.C. section 360bbb-3(b)(1), unless the authorization is terminated or revoked sooner.  Performed at [facility], [address] [place], [place] [address]   MRSA PCR Screening     Status: None   Collection Time: 12/12/20  6:12 PM   Specimen: Nasopharyngeal  Result Value Ref Range Status   MRSA by PCR NEGATIVE NEGATIVE Final    Comment:        The GeneXpert MRSA Assay (FDA approved for NASAL specimens only), is one component of a comprehensive MRSA colonization surveillance program. It is not intended to diagnose MRSA infection nor to guide or monitor treatment for MRSA infections. Performed at [facility], [address] [place], [place] [address]      Labs: BNP (last 3 results) Recent Labs    10/07/20 2219  BNP 88.9   Basic Metabolic Panel: Recent Labs  Lab 12/12/20 1231 12/12/20 1435 12/12/20 1814 12/12/20 2148 12/13/20 0155  NA 131* 131* 136 136 137  K 4.8 5.1 4.4 3.9 3.5  CL 98 98 103 106 107  CO2 10* 11* 15* 18* 21*  GLUCOSE 559* 544* 317* 201* 196*  BUN 24* 24* 24* 23* 21*  CREATININE 1.39* 1.60* 1.25* 1.00 0.93  CALCIUM 9.2 9.5 10.0 9.4 9.3   Liver Function Tests: Recent Labs  Lab 12/12/20 1231  AST 17  ALT 16  ALKPHOS 110  BILITOT 2.6*  PROT 8.3*  ALBUMIN 4.4   Recent Labs  Lab 12/12/20 1814  LIPASE 18   No results for input(s): AMMONIA in the last 168 hours. CBC: Recent Labs  Lab 12/12/20 1231  WBC 8.8  HGB  13.0  HCT 37.3  MCV 89.7  PLT 283   Cardiac Enzymes: No results for input(s): CKTOTAL, CKMB, CKMBINDEX, TROPONINI in the last 168 hours. BNP: Invalid input(s): POCBNP CBG: Recent Labs  Lab 12/13/20 0407 12/13/20 0520 12/13/20 0558 12/13/20 0700 12/13/20 0804  GLUCAP 212* 159* 148* 171* 196*   D-Dimer No results for input(s): DDIMER in the last 72 hours. Hgb A1c No results for input(s): HGBA1C in the last 72 hours. Lipid Profile No results for input(s): CHOL, HDL, LDLCALC, TRIG, CHOLHDL, LDLDIRECT in the last 72 hours. Thyroid function studies No results for input(s): TSH, T4TOTAL, T3FREE, THYROIDAB in the last 72 hours.  Invalid input(s): FREET3 Anemia work up No results for input(s): VITAMINB12, FOLATE, FERRITIN, TIBC, IRON, RETICCTPCT in the last 72 hours. Urinalysis    Component Value Date/Time   COLORURINE YELLOW (A) 11/15/2020 2323   APPEARANCEUR CLOUDY (A) 11/15/2020 2323   LABSPEC 1.023 11/15/2020 2323   PHURINE 6.0 11/15/2020 2323   GLUCOSEU >=500 (A) 11/15/2020 2323   HGBUR NEGATIVE 11/15/2020 2323   BILIRUBINUR NEGATIVE 11/15/2020 2323   KETONESUR 20 (A) 11/15/2020 2323   PROTEINUR 30 (A) 11/15/2020 2323   NITRITE  NEGATIVE 11/15/2020 2323   LEUKOCYTESUR NEGATIVE 11/15/2020 2323   Sepsis Labs Invalid input(s): PROCALCITONIN,  WBC,  LACTICIDVEN Microbiology Recent Results (from the past 240 hour(s))  SARS CORONAVIRUS 2 (TAT 6-24 HRS) Nasopharyngeal Nasopharyngeal Swab     Status: None   Collection Time: 12/12/20  2:35 PM   Specimen: Nasopharyngeal Swab  Result Value Ref Range Status   SARS Coronavirus 2 NEGATIVE NEGATIVE Final    Comment: (NOTE) SARS-CoV-2 target nucleic acids are NOT DETECTED.  The SARS-CoV-2 RNA is generally detectable in upper and lower respiratory specimens during the acute phase of infection. Negative results do not preclude SARS-CoV-2 infection, do not rule out co-infections with other pathogens, and should not be used as the sole basis for treatment or other patient management decisions. Negative results must be combined with clinical observations, patient history, and epidemiological information. The expected result is Negative.  Fact Sheet for Patients: HairSlick.no  Fact Sheet for Healthcare Providers: quierodirigir.com  This test is not yet approved or cleared by the [place] FDA and  has been authorized for detection and/or diagnosis of SARS-CoV-2 by FDA under an Emergency Use Authorization (EUA). This EUA will remain  in effect (meaning this test can be used) for the duration of the COVID-19 declaration under Se ction 564(b)(1) of the Act, 21 U.S.C. section 360bbb-3(b)(1), unless the authorization is terminated or revoked sooner.  Performed at [facility], [address], [place], [place] [address]   MRSA PCR Screening     Status: None   Collection Time: 12/12/20  6:12 PM   Specimen: Nasopharyngeal  Result Value Ref Range Status   MRSA by PCR NEGATIVE NEGATIVE Final    Comment:        The GeneXpert MRSA Assay (FDA approved for NASAL specimens only), is one component of a comprehensive  MRSA colonization surveillance program. It is not intended to diagnose MRSA infection nor to guide or monitor treatment for MRSA infections. Performed at [facility], [address] [place], [place] [address]     Time coordinating discharge: Over 30 minutes  SIGNED:  Arnetha Courser, MD  [facility] 12/13/2020, 12:44 PM  If 7PM-7AM, please contact night-coverage [email or web address]  This record has been created using Conservation officer, historic buildings. Errors have been sought and corrected,but may not always  be located. Such creation errors do not reflect on the standard of care.

## 2020-12-15 LAB — GLUCOSE, CAPILLARY: Glucose-Capillary: 327 mg/dL — ABNORMAL HIGH (ref 70–99)

## 2021-01-11 ENCOUNTER — Inpatient Hospital Stay
Admission: EM | Admit: 2021-01-11 | Discharge: 2021-01-14 | DRG: 637 | Discharge status: Against Medical Advice | Payer: Medicaid Other | Attending: Internal Medicine | Admitting: Internal Medicine

## 2021-01-11 ENCOUNTER — Emergency Department: Payer: Medicaid Other

## 2021-01-11 ENCOUNTER — Inpatient Hospital Stay: Payer: Self-pay

## 2021-01-11 ENCOUNTER — Other Ambulatory Visit: Payer: Self-pay

## 2021-01-11 ENCOUNTER — Inpatient Hospital Stay: Payer: Medicaid Other

## 2021-01-11 DIAGNOSIS — F1010 Alcohol abuse, uncomplicated: Secondary | ICD-10-CM | POA: Diagnosis present

## 2021-01-11 DIAGNOSIS — Z79899 Other long term (current) drug therapy: Secondary | ICD-10-CM

## 2021-01-11 DIAGNOSIS — E0810 Diabetes mellitus due to underlying condition with ketoacidosis without coma: Secondary | ICD-10-CM | POA: Diagnosis not present

## 2021-01-11 DIAGNOSIS — R112 Nausea with vomiting, unspecified: Secondary | ICD-10-CM | POA: Diagnosis present

## 2021-01-11 DIAGNOSIS — Z20822 Contact with and (suspected) exposure to covid-19: Secondary | ICD-10-CM | POA: Diagnosis present

## 2021-01-11 DIAGNOSIS — F3130 Bipolar disorder, current episode depressed, mild or moderate severity, unspecified: Secondary | ICD-10-CM | POA: Diagnosis not present

## 2021-01-11 DIAGNOSIS — F419 Anxiety disorder, unspecified: Secondary | ICD-10-CM | POA: Diagnosis present

## 2021-01-11 DIAGNOSIS — Z720 Tobacco use: Secondary | ICD-10-CM | POA: Diagnosis present

## 2021-01-11 DIAGNOSIS — E785 Hyperlipidemia, unspecified: Secondary | ICD-10-CM | POA: Diagnosis present

## 2021-01-11 DIAGNOSIS — E871 Hypo-osmolality and hyponatremia: Secondary | ICD-10-CM | POA: Diagnosis present

## 2021-01-11 DIAGNOSIS — R6510 Systemic inflammatory response syndrome (SIRS) of non-infectious origin without acute organ dysfunction: Secondary | ICD-10-CM | POA: Diagnosis present

## 2021-01-11 DIAGNOSIS — Z7982 Long term (current) use of aspirin: Secondary | ICD-10-CM

## 2021-01-11 DIAGNOSIS — N1831 Chronic kidney disease, stage 3a: Secondary | ICD-10-CM | POA: Diagnosis present

## 2021-01-11 DIAGNOSIS — Z8249 Family history of ischemic heart disease and other diseases of the circulatory system: Secondary | ICD-10-CM

## 2021-01-11 DIAGNOSIS — F1410 Cocaine abuse, uncomplicated: Secondary | ICD-10-CM | POA: Diagnosis present

## 2021-01-11 DIAGNOSIS — E861 Hypovolemia: Secondary | ICD-10-CM | POA: Diagnosis present

## 2021-01-11 DIAGNOSIS — J4520 Mild intermittent asthma, uncomplicated: Secondary | ICD-10-CM | POA: Diagnosis not present

## 2021-01-11 DIAGNOSIS — E1129 Type 2 diabetes mellitus with other diabetic kidney complication: Secondary | ICD-10-CM | POA: Diagnosis present

## 2021-01-11 DIAGNOSIS — E860 Dehydration: Secondary | ICD-10-CM | POA: Diagnosis present

## 2021-01-11 DIAGNOSIS — E1122 Type 2 diabetes mellitus with diabetic chronic kidney disease: Secondary | ICD-10-CM | POA: Diagnosis present

## 2021-01-11 DIAGNOSIS — I10 Essential (primary) hypertension: Secondary | ICD-10-CM | POA: Diagnosis present

## 2021-01-11 DIAGNOSIS — G9341 Metabolic encephalopathy: Secondary | ICD-10-CM | POA: Diagnosis present

## 2021-01-11 DIAGNOSIS — I471 Supraventricular tachycardia, unspecified: Secondary | ICD-10-CM | POA: Diagnosis present

## 2021-01-11 DIAGNOSIS — E1010 Type 1 diabetes mellitus with ketoacidosis without coma: Secondary | ICD-10-CM | POA: Diagnosis not present

## 2021-01-11 DIAGNOSIS — I959 Hypotension, unspecified: Secondary | ICD-10-CM | POA: Diagnosis present

## 2021-01-11 DIAGNOSIS — J45909 Unspecified asthma, uncomplicated: Secondary | ICD-10-CM | POA: Diagnosis present

## 2021-01-11 DIAGNOSIS — R079 Chest pain, unspecified: Secondary | ICD-10-CM | POA: Diagnosis not present

## 2021-01-11 DIAGNOSIS — E872 Acidosis: Secondary | ICD-10-CM | POA: Diagnosis present

## 2021-01-11 DIAGNOSIS — R109 Unspecified abdominal pain: Secondary | ICD-10-CM | POA: Diagnosis not present

## 2021-01-11 DIAGNOSIS — E875 Hyperkalemia: Secondary | ICD-10-CM | POA: Diagnosis present

## 2021-01-11 DIAGNOSIS — E1110 Type 2 diabetes mellitus with ketoacidosis without coma: Secondary | ICD-10-CM | POA: Diagnosis not present

## 2021-01-11 DIAGNOSIS — K219 Gastro-esophageal reflux disease without esophagitis: Secondary | ICD-10-CM | POA: Diagnosis present

## 2021-01-11 DIAGNOSIS — Z794 Long term (current) use of insulin: Secondary | ICD-10-CM | POA: Diagnosis not present

## 2021-01-11 DIAGNOSIS — F319 Bipolar disorder, unspecified: Secondary | ICD-10-CM | POA: Diagnosis present

## 2021-01-11 DIAGNOSIS — R571 Hypovolemic shock: Secondary | ICD-10-CM | POA: Diagnosis present

## 2021-01-11 DIAGNOSIS — F17210 Nicotine dependence, cigarettes, uncomplicated: Secondary | ICD-10-CM | POA: Diagnosis present

## 2021-01-11 DIAGNOSIS — N179 Acute kidney failure, unspecified: Secondary | ICD-10-CM | POA: Diagnosis present

## 2021-01-11 DIAGNOSIS — K861 Other chronic pancreatitis: Secondary | ICD-10-CM | POA: Diagnosis present

## 2021-01-11 DIAGNOSIS — R0682 Tachypnea, not elsewhere classified: Secondary | ICD-10-CM | POA: Diagnosis present

## 2021-01-11 DIAGNOSIS — Z9119 Patient's noncompliance with other medical treatment and regimen: Secondary | ICD-10-CM

## 2021-01-11 DIAGNOSIS — Z5329 Procedure and treatment not carried out because of patient's decision for other reasons: Secondary | ICD-10-CM | POA: Diagnosis not present

## 2021-01-11 LAB — BASIC METABOLIC PANEL
Anion gap: 20 — ABNORMAL HIGH (ref 5–15)
BUN: 34 mg/dL — ABNORMAL HIGH (ref 6–20)
BUN: 34 mg/dL — ABNORMAL HIGH (ref 6–20)
BUN: 34 mg/dL — ABNORMAL HIGH (ref 6–20)
CO2: <7 mmol/L — ABNORMAL LOW (ref 22–32)
CO2: <7 mmol/L — ABNORMAL LOW (ref 22–32)
CO2: 8 mmol/L — ABNORMAL LOW (ref 22–32)
Calcium: 8.5 mg/dL — ABNORMAL LOW (ref 8.9–10.3)
Calcium: 9 mg/dL (ref 8.9–10.3)
Calcium: 8.7 mg/dL — ABNORMAL LOW (ref 8.9–10.3)
Chloride: 95 mmol/L — ABNORMAL LOW (ref 98–111)
Chloride: 98 mmol/L (ref 98–111)
Chloride: 109 mmol/L (ref 98–111)
Creatinine, Ser: 3.1 mg/dL — ABNORMAL HIGH (ref 0.44–1.00)
Creatinine, Ser: 2.81 mg/dL — ABNORMAL HIGH (ref 0.44–1.00)
Creatinine, Ser: 2.54 mg/dL — ABNORMAL HIGH (ref 0.44–1.00)
GFR, Estimated: 17 mL/min — ABNORMAL LOW (ref >60)
GFR, Estimated: 19 mL/min — ABNORMAL LOW (ref >60)
GFR, Estimated: 21 mL/min — ABNORMAL LOW (ref >60)
Glucose, Bld: 914 mg/dL (ref 70–99)
Glucose, Bld: 848 mg/dL (ref 70–99)
Glucose, Bld: 507 mg/dL (ref 70–99)
Potassium: 5.6 mmol/L — ABNORMAL HIGH (ref 3.5–5.1)
Potassium: 5.4 mmol/L — ABNORMAL HIGH (ref 3.5–5.1)
Potassium: 4.8 mmol/L (ref 3.5–5.1)
Sodium: 127 mmol/L — ABNORMAL LOW (ref 135–145)
Sodium: 130 mmol/L — ABNORMAL LOW (ref 135–145)
Sodium: 137 mmol/L (ref 135–145)

## 2021-01-11 LAB — COMPREHENSIVE METABOLIC PANEL
ALT: 9 U/L (ref 0–44)
AST: 12 U/L — ABNORMAL LOW (ref 15–41)
Albumin: 4.2 g/dL (ref 3.5–5.0)
Alkaline Phosphatase: 86 U/L (ref 38–126)
Anion gap: 23 — ABNORMAL HIGH (ref 5–15)
BUN: 34 mg/dL — ABNORMAL HIGH (ref 6–20)
CO2: 11 mmol/L — ABNORMAL LOW (ref 22–32)
Calcium: 9.5 mg/dL (ref 8.9–10.3)
Chloride: 89 mmol/L — ABNORMAL LOW (ref 98–111)
Creatinine, Ser: 3.19 mg/dL — ABNORMAL HIGH (ref 0.44–1.00)
GFR, Estimated: 16 mL/min — ABNORMAL LOW (ref >60)
Glucose, Bld: 975 mg/dL (ref 70–99)
Potassium: 5.5 mmol/L — ABNORMAL HIGH (ref 3.5–5.1)
Sodium: 123 mmol/L — ABNORMAL LOW (ref 135–145)
Total Bilirubin: 1.5 mg/dL — ABNORMAL HIGH (ref 0.3–1.2)
Total Protein: 7.9 g/dL (ref 6.5–8.1)

## 2021-01-11 LAB — RESP PANEL BY RT-PCR (FLU A&B, COVID) ARPGX2
Influenza A by PCR: NEGATIVE
Influenza B by PCR: NEGATIVE
SARS Coronavirus 2 by RT PCR: NEGATIVE

## 2021-01-11 LAB — GLUCOSE, CAPILLARY
Glucose-Capillary: 173 mg/dL — ABNORMAL HIGH (ref 70–99)
Glucose-Capillary: 207 mg/dL — ABNORMAL HIGH (ref 70–99)
Glucose-Capillary: 294 mg/dL — ABNORMAL HIGH (ref 70–99)
Glucose-Capillary: 308 mg/dL — ABNORMAL HIGH (ref 70–99)
Glucose-Capillary: 474 mg/dL — ABNORMAL HIGH (ref 70–99)
Glucose-Capillary: 542 mg/dL (ref 70–99)
Glucose-Capillary: 567 mg/dL (ref 70–99)
Glucose-Capillary: >600 mg/dL (ref 70–99)
Glucose-Capillary: >600 mg/dL (ref 70–99)
Glucose-Capillary: >600 mg/dL (ref 70–99)

## 2021-01-11 LAB — CBC WITH DIFFERENTIAL/PLATELET
Abs Immature Granulocytes: 0.09 10*3/uL — ABNORMAL HIGH (ref 0.00–0.07)
Basophils Absolute: 0.1 10*3/uL (ref 0.0–0.1)
Basophils Relative: 1 %
Eosinophils Absolute: 0 10*3/uL (ref 0.0–0.5)
Eosinophils Relative: 0 %
HCT: 43.1 % (ref 36.0–46.0)
Hemoglobin: 13.8 g/dL (ref 12.0–15.0)
Immature Granulocytes: 1 %
Lymphocytes Relative: 9 %
Lymphs Abs: 1.2 10*3/uL (ref 0.7–4.0)
MCH: 31.2 pg (ref 26.0–34.0)
MCHC: 32 g/dL (ref 30.0–36.0)
MCV: 97.3 fL (ref 80.0–100.0)
Monocytes Absolute: 0.6 10*3/uL (ref 0.1–1.0)
Monocytes Relative: 5 %
Neutro Abs: 10.9 10*3/uL — ABNORMAL HIGH (ref 1.7–7.7)
Neutrophils Relative %: 84 %
Platelets: 377 10*3/uL (ref 150–400)
RBC: 4.43 MIL/uL (ref 3.87–5.11)
RDW: 13 % (ref 11.5–15.5)
WBC: 12.9 10*3/uL — ABNORMAL HIGH (ref 4.0–10.5)
nRBC: 0 % (ref 0.0–0.2)

## 2021-01-11 LAB — LACTIC ACID, PLASMA
Lactic Acid, Venous: 3.5 mmol/L (ref 0.5–1.9)
Lactic Acid, Venous: 3.4 mmol/L (ref 0.5–1.9)
Lactic Acid, Venous: 3.7 mmol/L (ref 0.5–1.9)

## 2021-01-11 LAB — BLOOD GAS, VENOUS
Acid-base deficit: 24.1 mmol/L — ABNORMAL HIGH (ref 0.0–2.0)
Bicarbonate: 5.4 mmol/L — ABNORMAL LOW (ref 20.0–28.0)
O2 Saturation: 75 %
Patient temperature: 37
pCO2, Ven: 21 mmHg — ABNORMAL LOW (ref 44.0–60.0)
pH, Ven: 7.02 — CL (ref 7.250–7.430)
pO2, Ven: 61 mmHg — ABNORMAL HIGH (ref 32.0–45.0)

## 2021-01-11 LAB — PROTIME-INR
INR: 1.1 (ref 0.8–1.2)
Prothrombin Time: 14.4 seconds (ref 11.4–15.2)

## 2021-01-11 LAB — URINE DRUG SCREEN, QUALITATIVE (ARMC ONLY)
Amphetamines, Ur Screen: NOT DETECTED
Barbiturates, Ur Screen: NOT DETECTED
Benzodiazepine, Ur Scrn: NOT DETECTED
Cannabinoid 50 Ng, Ur ~~LOC~~: NOT DETECTED
Cocaine Metabolite,Ur ~~LOC~~: POSITIVE — AB
MDMA (Ecstasy)Ur Screen: NOT DETECTED
Methadone Scn, Ur: NOT DETECTED
Opiate, Ur Screen: NOT DETECTED
Phencyclidine (PCP) Ur S: NOT DETECTED
Tricyclic, Ur Screen: NOT DETECTED

## 2021-01-11 LAB — LIPASE, BLOOD: Lipase: 23 U/L (ref 11–51)

## 2021-01-11 LAB — BETA-HYDROXYBUTYRIC ACID: Beta-Hydroxybutyric Acid: >8 mmol/L — ABNORMAL HIGH (ref 0.05–0.27)

## 2021-01-11 LAB — URINALYSIS, COMPLETE (UACMP) WITH MICROSCOPIC
Bilirubin Urine: NEGATIVE
Glucose, UA: >=500 mg/dL — AB
Ketones, ur: 80 mg/dL — AB
Leukocytes,Ua: NEGATIVE
Nitrite: NEGATIVE
Protein, ur: 100 mg/dL — AB
Specific Gravity, Urine: 1.014 (ref 1.005–1.030)
WBC, UA: NONE SEEN WBC/hpf (ref 0–5)
pH: 6 (ref 5.0–8.0)

## 2021-01-11 LAB — PROCALCITONIN: Procalcitonin: 0.12 ng/mL

## 2021-01-11 LAB — TROPONIN I (HIGH SENSITIVITY)
Troponin I (High Sensitivity): 5 ng/L (ref <18)
Troponin I (High Sensitivity): 6 ng/L (ref <18)
Troponin I (High Sensitivity): 12 ng/L (ref <18)
Troponin I (High Sensitivity): 42 ng/L — ABNORMAL HIGH (ref <18)

## 2021-01-11 LAB — CBG MONITORING, ED
Glucose-Capillary: >600 mg/dL (ref 70–99)
Glucose-Capillary: >600 mg/dL (ref 70–99)
Glucose-Capillary: >600 mg/dL (ref 70–99)

## 2021-01-11 LAB — MRSA NEXT GEN BY PCR, NASAL: MRSA by PCR Next Gen: NOT DETECTED

## 2021-01-11 LAB — MAGNESIUM: Magnesium: 2.6 mg/dL — ABNORMAL HIGH (ref 1.7–2.4)

## 2021-01-11 LAB — PHOSPHORUS: Phosphorus: 7 mg/dL — ABNORMAL HIGH (ref 2.5–4.6)

## 2021-01-11 LAB — APTT: aPTT: 27 seconds (ref 24–36)

## 2021-01-11 MED ORDER — THIAMINE HCL 100 MG PO TABS
100.0000 mg | ORAL_TABLET | Freq: Every day | ORAL | Status: DC
  Administered 2021-01-11 – 2021-01-12 (×2): 100 mg via ORAL
  Filled 2021-01-11 (×2): qty 1

## 2021-01-11 MED ORDER — METOPROLOL TARTRATE 5 MG/5ML IV SOLN
2.5000 mg | INTRAVENOUS | Status: DC | PRN
  Filled 2021-01-11: qty 5

## 2021-01-11 MED ORDER — ASPIRIN EC 81 MG PO TBEC
81.0000 mg | DELAYED_RELEASE_TABLET | Freq: Every day | ORAL | Status: DC
  Administered 2021-01-11 – 2021-01-14 (×4): 81 mg via ORAL
  Filled 2021-01-11 (×4): qty 1

## 2021-01-11 MED ORDER — SODIUM CHLORIDE 0.9 % IV BOLUS
500.0000 mL | Freq: Once | INTRAVENOUS | Status: AC
  Administered 2021-01-11: 500 mL via INTRAVENOUS

## 2021-01-11 MED ORDER — DEXTROSE IN LACTATED RINGERS 5 % IV SOLN: INTRAVENOUS | Status: DC

## 2021-01-11 MED ORDER — PANTOPRAZOLE SODIUM 40 MG PO TBEC: 40.0000 mg | DELAYED_RELEASE_TABLET | Freq: Every day | ORAL | Status: DC

## 2021-01-11 MED ORDER — NICOTINE 21 MG/24HR TD PT24
21.0000 mg | MEDICATED_PATCH | Freq: Every day | TRANSDERMAL | Status: DC
  Administered 2021-01-11 – 2021-01-14 (×4): 21 mg via TRANSDERMAL
  Filled 2021-01-11 (×4): qty 1

## 2021-01-11 MED ORDER — INSULIN REGULAR(HUMAN) IN NACL 100-0.9 UT/100ML-% IV SOLN
INTRAVENOUS | Status: DC
  Administered 2021-01-11: 9.5 [IU]/h via INTRAVENOUS
  Administered 2021-01-11: 8.5 [IU]/h via INTRAVENOUS
  Filled 2021-01-11 (×2): qty 100

## 2021-01-11 MED ORDER — LORAZEPAM 2 MG/ML IJ SOLN
0.0000 mg | Freq: Two times a day (BID) | INTRAMUSCULAR | Status: DC
Start: 2021-01-13 — End: 2021-01-12

## 2021-01-11 MED ORDER — VITAMIN B-12 1000 MCG PO TABS
500.0000 ug | ORAL_TABLET | Freq: Every day | ORAL | Status: DC
  Administered 2021-01-12 – 2021-01-14 (×3): 500 ug via ORAL
  Filled 2021-01-11 (×3): qty 1

## 2021-01-11 MED ORDER — INSULIN REGULAR(HUMAN) IN NACL 100-0.9 UT/100ML-% IV SOLN: INTRAVENOUS | Status: DC

## 2021-01-11 MED ORDER — SODIUM CHLORIDE 0.9 % IV SOLN: INTRAVENOUS | Status: DC

## 2021-01-11 MED ORDER — DM-GUAIFENESIN ER 30-600 MG PO TB12: 1.0000 | ORAL_TABLET | Freq: Two times a day (BID) | ORAL | Status: DC | PRN

## 2021-01-11 MED ORDER — SODIUM CHLORIDE 0.9 % IV BOLUS
1000.0000 mL | Freq: Once | INTRAVENOUS | Status: AC
  Administered 2021-01-11: 1000 mL via INTRAVENOUS

## 2021-01-11 MED ORDER — ORAL CARE MOUTH RINSE
15.0000 mL | Freq: Two times a day (BID) | OROMUCOSAL | Status: DC
  Administered 2021-01-11 – 2021-01-12 (×3): 15 mL via OROMUCOSAL

## 2021-01-11 MED ORDER — HALOPERIDOL LACTATE 5 MG/ML IJ SOLN
2.0000 mg | Freq: Once | INTRAMUSCULAR | Status: AC
  Administered 2021-01-11: 2 mg via INTRAVENOUS

## 2021-01-11 MED ORDER — SODIUM CHLORIDE 0.9 % IV BOLUS: 500.0000 mL | Freq: Once | INTRAVENOUS | Status: DC

## 2021-01-11 MED ORDER — HEPARIN SODIUM (PORCINE) 5000 UNIT/ML IJ SOLN
5000.0000 [IU] | Freq: Three times a day (TID) | INTRAMUSCULAR | Status: DC
  Administered 2021-01-11 – 2021-01-14 (×9): 5000 [IU] via SUBCUTANEOUS
  Filled 2021-01-11 (×8): qty 1

## 2021-01-11 MED ORDER — DEXTROSE-NACL 5-0.45 % IV SOLN: INTRAVENOUS | Status: DC

## 2021-01-11 MED ORDER — VANCOMYCIN VARIABLE DOSE PER UNSTABLE RENAL FUNCTION (PHARMACIST DOSING): Status: DC

## 2021-01-11 MED ORDER — SODIUM CHLORIDE 0.9 % IV SOLN
2.0000 g | Freq: Every day | INTRAVENOUS | Status: DC
  Administered 2021-01-11: 2 g via INTRAVENOUS
  Filled 2021-01-11 (×2): qty 2

## 2021-01-11 MED ORDER — ATORVASTATIN CALCIUM 20 MG PO TABS
40.0000 mg | ORAL_TABLET | Freq: Every day | ORAL | Status: DC
  Administered 2021-01-12 – 2021-01-13 (×2): 40 mg via ORAL
  Filled 2021-01-11 (×2): qty 2

## 2021-01-11 MED ORDER — GABAPENTIN 100 MG PO CAPS
100.0000 mg | ORAL_CAPSULE | Freq: Two times a day (BID) | ORAL | Status: DC
  Administered 2021-01-12 – 2021-01-14 (×5): 100 mg via ORAL
  Filled 2021-01-11 (×5): qty 1

## 2021-01-11 MED ORDER — CHLORHEXIDINE GLUCONATE CLOTH 2 % EX PADS
6.0000 | MEDICATED_PAD | Freq: Every day | CUTANEOUS | Status: DC
  Administered 2021-01-11 – 2021-01-13 (×3): 6 via TOPICAL

## 2021-01-11 MED ORDER — DEXTROSE 50 % IV SOLN: 0.0000 mL | INTRAVENOUS | Status: DC | PRN

## 2021-01-11 MED ORDER — MORPHINE SULFATE (PF) 2 MG/ML IV SOLN: 2.0000 mg | INTRAVENOUS | Status: DC | PRN

## 2021-01-11 MED ORDER — DEXMEDETOMIDINE HCL IN NACL 400 MCG/100ML IV SOLN
0.4000 ug/kg/h | INTRAVENOUS | Status: DC
  Administered 2021-01-11: 0.8 ug/kg/h via INTRAVENOUS
  Filled 2021-01-11: qty 100

## 2021-01-11 MED ORDER — THIAMINE HCL 100 MG/ML IJ SOLN: 100.0000 mg | Freq: Every day | INTRAMUSCULAR | Status: DC

## 2021-01-11 MED ORDER — HYDRALAZINE HCL 20 MG/ML IJ SOLN: 5.0000 mg | INTRAMUSCULAR | Status: DC | PRN

## 2021-01-11 MED ORDER — LORAZEPAM 2 MG/ML IJ SOLN: 1.0000 mg | INTRAMUSCULAR | Status: DC | PRN

## 2021-01-11 MED ORDER — LACTATED RINGERS IV SOLN: INTRAVENOUS | Status: DC

## 2021-01-11 MED ORDER — HALOPERIDOL LACTATE 5 MG/ML IJ SOLN
1.0000 mg | Freq: Once | INTRAMUSCULAR | Status: DC
  Filled 2021-01-11: qty 1

## 2021-01-11 MED ORDER — ADULT MULTIVITAMIN W/MINERALS CH
1.0000 | ORAL_TABLET | Freq: Every day | ORAL | Status: DC
  Administered 2021-01-11 – 2021-01-14 (×4): 1 via ORAL
  Filled 2021-01-11 (×4): qty 1

## 2021-01-11 MED ORDER — DILTIAZEM HCL ER COATED BEADS 240 MG PO CP24
240.0000 mg | ORAL_CAPSULE | Freq: Every day | ORAL | Status: DC
  Administered 2021-01-13 – 2021-01-14 (×2): 240 mg via ORAL
  Filled 2021-01-11 (×4): qty 1

## 2021-01-11 MED ORDER — ONDANSETRON HCL 4 MG/2ML IJ SOLN
4.0000 mg | Freq: Three times a day (TID) | INTRAMUSCULAR | Status: DC | PRN
  Administered 2021-01-13 – 2021-01-14 (×2): 4 mg via INTRAVENOUS
  Filled 2021-01-11 (×2): qty 2

## 2021-01-11 MED ORDER — PANTOPRAZOLE SODIUM 40 MG IV SOLR
40.0000 mg | INTRAVENOUS | Status: DC
  Administered 2021-01-11: 40 mg via INTRAVENOUS
  Filled 2021-01-11: qty 40

## 2021-01-11 MED ORDER — SODIUM BICARBONATE 8.4 % IV SOLN
50.0000 meq | Freq: Once | INTRAVENOUS | Status: AC
  Administered 2021-01-11: 50 meq via INTRAVENOUS
  Filled 2021-01-11: qty 50

## 2021-01-11 MED ORDER — METOPROLOL TARTRATE 5 MG/5ML IV SOLN: 5.0000 mg | Freq: Once | INTRAVENOUS | Status: DC

## 2021-01-11 MED ORDER — DILTIAZEM HCL 30 MG PO TABS: 30.0000 mg | ORAL_TABLET | Freq: Once | ORAL | Status: DC

## 2021-01-11 MED ORDER — SENNOSIDES-DOCUSATE SODIUM 8.6-50 MG PO TABS: 1.0000 | ORAL_TABLET | Freq: Every evening | ORAL | Status: DC | PRN

## 2021-01-11 MED ORDER — BUPROPION HCL ER (SR) 150 MG PO TB12
150.0000 mg | ORAL_TABLET | Freq: Two times a day (BID) | ORAL | Status: DC
  Administered 2021-01-12 – 2021-01-14 (×5): 150 mg via ORAL
  Filled 2021-01-11 (×7): qty 1

## 2021-01-11 MED ORDER — ACETAMINOPHEN 325 MG PO TABS
650.0000 mg | ORAL_TABLET | Freq: Four times a day (QID) | ORAL | Status: DC | PRN
  Administered 2021-01-13: 650 mg via ORAL
  Filled 2021-01-11: qty 2

## 2021-01-11 MED ORDER — LACTATED RINGERS IV BOLUS
1000.0000 mL | Freq: Once | INTRAVENOUS | Status: AC
  Administered 2021-01-11: 1000 mL via INTRAVENOUS

## 2021-01-11 MED ORDER — LORAZEPAM 1 MG PO TABS: 1.0000 mg | ORAL_TABLET | ORAL | Status: DC | PRN

## 2021-01-11 MED ORDER — FOLIC ACID 1 MG PO TABS
1.0000 mg | ORAL_TABLET | Freq: Every day | ORAL | Status: DC
  Administered 2021-01-11 – 2021-01-12 (×2): 1 mg via ORAL
  Filled 2021-01-11 (×2): qty 1

## 2021-01-11 MED ORDER — ALBUTEROL SULFATE (2.5 MG/3ML) 0.083% IN NEBU
2.5000 mg | INHALATION_SOLUTION | RESPIRATORY_TRACT | Status: DC | PRN
  Administered 2021-01-11: 2.5 mg via RESPIRATORY_TRACT
  Filled 2021-01-11: qty 3

## 2021-01-11 MED ORDER — DILTIAZEM HCL-DEXTROSE 125-5 MG/125ML-% IV SOLN (PREMIX)
5.0000 mg/h | INTRAVENOUS | Status: DC
  Filled 2021-01-11: qty 125

## 2021-01-11 MED ORDER — VANCOMYCIN HCL 1750 MG/350ML IV SOLN
1750.0000 mg | Freq: Once | INTRAVENOUS | Status: AC
  Administered 2021-01-11: 1750 mg via INTRAVENOUS
  Filled 2021-01-11: qty 350

## 2021-01-11 MED ORDER — PANTOPRAZOLE SODIUM 40 MG IV SOLR: 40.0000 mg | INTRAVENOUS | Status: DC

## 2021-01-11 MED ORDER — LORAZEPAM 2 MG/ML IJ SOLN
0.0000 mg | Freq: Four times a day (QID) | INTRAMUSCULAR | Status: DC
  Administered 2021-01-11: 2 mg via INTRAVENOUS
  Filled 2021-01-11: qty 1

## 2021-01-11 NOTE — Progress Notes (Signed)
CODE SEPSIS - PHARMACY COMMUNICATION  **Broad Spectrum Antibiotics should be administered within 1 hour of Sepsis diagnosis**  Time Code Sepsis Called/Page Received: 1213  Antibiotics Ordered: Cefepime and vancomycin  Time of 1st antibiotic administration: 1314  Additional action taken by pharmacy: Spoke with RN at 1312 to remind 1-hr antibiotic administration goal  Tressie Ellis 01/11/2021  12:17 PM

## 2021-01-11 NOTE — ED Provider Notes (Signed)
Ultrasound ED Peripheral IV (Provider)  Date/Time: 01/11/2021 3:27 PM Performed by: Shaune Pollack, MD Authorized by: Shaune Pollack, MD   Procedure details:    Indications: multiple failed IV attempts     Skin Prep: chlorhexidine gluconate     Location:  Right forearm   Angiocath:  18 G   Bedside Ultrasound Guided: Yes     Images: archived     Patient tolerated procedure without complications: Yes     Dressing applied: Yes   Ultrasound ED Peripheral IV (Provider)  Date/Time: 01/11/2021 3:27 PM Performed by: Shaune Pollack, MD Authorized by: Shaune Pollack, MD   Procedure details:    Indications: multiple failed IV attempts     Skin Prep: chlorhexidine gluconate     Location:  Left anterior forearm   Angiocath:  20 G   Bedside Ultrasound Guided: Yes     Images: archived     Patient tolerated procedure without complications: Yes     Dressing applied: Yes      Shaune Pollack, MD 01/11/21 1527

## 2021-01-11 NOTE — ED Triage Notes (Signed)
Patient brought in via EMS from [facility]. Patient c/o SOB, hyperglycemia, and chest pain since this AM. EMS read HI on CBG and 180s HR. Patient drowsy and agitated. A&Ox4

## 2021-01-11 NOTE — Progress Notes (Signed)
Phlebotomy at bedside, attempted to get labs. Limited access in BUE. Unable to get Lactic and BMP. Discussed with Dr. Jayme Cloud, can now stick feet for labs due. Nursing care order placed at this time.

## 2021-01-11 NOTE — H&P (Signed)
History and Physical    Pamela MoraleMary G Sinning ZOX:[id number] DOB: 11/02/1960 DOA: 01/11/2021  Referring MD/NP/PA:   PCP: [facility]   Patient coming from:  The patient is coming from hotel (patient states that she is living in hotel with her husband).  At baseline, pt is independent for most of ADL.        Chief Complaint: Nausea, vomiting, abdominal pain, shortness breath, chest pain  HPI: Pamela Lane is a 60 y.o. female with medical history significant of hypertension, hyperlipidemia, diabetes mellitus, asthma, GERD, depression, bipolar, C. difficile colitis, pancreatitis, cocaine abuse, DKA, tobacco abuse, SVT, UTI, CKD-3A, who presents with nausea, vomiting, abdominal pain, shortness of breath, chest pain.  Patient states that she has not been feeling good in the past several days, this morning she started having nausea vomiting, abdominal pain, shortness of breath, chest pain.  She has vomited several times with nonbilious nonbloody vomiting.  No diarrhea.  Her abdominal pain is diffuse, constant, moderate, sharp, nonradiating.  She denies symptoms of UTI.  She also complains of chest pain and shortness of breath.  Patient has a mild dry cough.  No fever or chills.  Patient states that she has whole front chest pain, which is mild to moderate, dull, nonradiating.  Patient is drowsy and has confusion, agitation, but is still oriented x3.  No facial droop or slurred speech.  She moves all extremities normally.  Patient states that she is taking her insulin, and her blood sugar is elevated.  Per report, pt had tachycardia with heart rate up to 180s.  EKG showed supraventricular tachycardia in ED. Patient was initially hypotensive with blood pressure 71/54, which improved to 99/70 after giving 1L of LR and 2L normal saline in ED. HR improved to 10os, then up to 120-130s in ED.  ED Course: pt was found to have DKA (blood sugar 975, bicarbonate 11, anion gap 23, beta  hydroxybutyric acid> 8.0), VBG with pH 7.02, CO2 21, O2 61.  WBC 12.9, lactic acid 3.5, troponin level 6, pending lipase, negative COVID PCR, worsening renal function, potassium 5.5, pseudohyponatremia, temperature 97.9, RR 33, oxygen saturation 98% on room air.  Chest x-ray negative.  Patient is admitted to stepdown as inpatient  Review of Systems:   General: no fevers, chills, no body weight gain, has poor appetite, has fatigue HEENT: no blurry vision, hearing changes or sore throat Respiratory: has dyspnea, coughing, no wheezing CV: has chest pain, no palpitations GI: has nausea, vomiting, abdominal pain, no diarrhea, has constipation GU: no dysuria, burning on urination, increased urinary frequency, hematuria  Ext: no leg edema Neuro: no unilateral weakness, numbness, or tingling, no vision change or hearing loss. Has confusion and agitation. Skin: no rash, no skin tear. MSK: No muscle spasm, no deformity, no limitation of range of movement in spin Heme: No easy bruising.  Travel history: No recent long distant travel.  Allergy: No Known Allergies  Past Medical History:  Diagnosis Date   Asthma    Clostridium difficile colitis    Diabetes mellitus without complication (HCC)    Hypertension    Mental disorder    pt reports 'I have all of them mental disorders'    Past Surgical History:  Procedure Laterality Date   ABDOMINAL HYSTERECTOMY      Social History:  reports that she has been smoking cigarettes. She has been smoking an average of 0.25 packs per day. She has never used smokeless tobacco. She reports current alcohol use of  about 40.0 standard drinks of alcohol per week. She reports current drug use. Drugs: Marijuana and Cocaine.  Family History:  Family History  Problem Relation Age of Onset   Hypertension Mother      Prior to Admission medications   Medication Sig Start Date End Date Taking? Authorizing Provider  albuterol (PROVENTIL HFA;VENTOLIN HFA) 108 (90  Base) MCG/ACT inhaler Inhale 2 puffs into the lungs every 6 (six) hours as needed for wheezing or shortness of breath. 02/18/18   Auburn Bilberry, MD  atorvastatin (LIPITOR) 40 MG tablet TAKE 1 TABLET BY MOUTH AT BEDTIME. Patient taking differently: Take 40 mg by mouth at bedtime. 08/29/20 08/29/21  Darlin Drop, DO  buPROPion (WELLBUTRIN SR) 150 MG 12 hr tablet Take 150 mg by mouth 2 (two) times daily. 10/01/20   [provider]  diltiazem (CARDIZEM CD) 240 MG 24 hr capsule Take 1 capsule (240 mg total) by mouth daily. 10/12/20   Alford Highland, MD  folic acid (FOLVITE) 1 MG tablet Take 1 tablet (1 mg total) by mouth daily. 03/08/18   Gouru, Deanna Artis, MD  gabapentin (NEURONTIN) 100 MG capsule TAKE 1 CAPSULE BY MOUTH 2 TIMES DAILY. Patient taking differently: Take 100 mg by mouth 2 (two) times daily. 08/29/20 08/29/21  Darlin Drop, DO  insulin glargine (LANTUS) 100 UNIT/ML Solostar Pen Inject 36 Units into the skin daily. 11/18/20 02/01/21  Tresa Moore, MD  lisinopril (ZESTRIL) 10 MG tablet Take 10 mg by mouth daily. 10/01/20   [provider]  metFORMIN (GLUCOPHAGE-XR) 500 MG 24 hr tablet TAKE 2 TABLETS (1,000 MG TOTAL) BY MOUTH DAILY WITH BREAKFAST. 08/29/20 08/29/21  Darlin Drop, DO  Multiple Vitamin (MULTIVITAMIN WITH MINERALS) TABS tablet Take 1 tablet by mouth daily. 03/08/18   Gouru, Deanna Artis, MD  nicotine (NICODERM CQ - DOSED IN MG/24 HOURS) 21 mg/24hr patch Place 1 patch (21 mg total) onto the skin daily. 11/18/20   Sreenath, Sudheer B, MD  NOVOLOG FLEXPEN 100 UNIT/ML FlexPen INJECT 10 UNITS INTO THE SKIN 3 TIMES DAILY WITH MEALS. 08/29/20 08/29/21  Darlin Drop, DO  omeprazole (PRILOSEC) 20 MG capsule Take 1 capsule by mouth daily. 10/11/20   [provider]  thiamine 100 MG tablet Take 1 tablet (100 mg total) by mouth daily. Patient not taking: No sig reported 10/11/20   Alford Highland, MD  vitamin B-12 (CYANOCOBALAMIN) 500 MCG tablet Take 500 mcg by mouth daily.     [provider]    Physical Exam: Vitals:   01/11/21 1100 01/11/21 1145 01/11/21 1233 01/11/21 1315  BP: 107/78 (!) 127/58  110/86  Pulse: (!) 108 63    Resp: (!) 23 (!) 30  20  Temp: 97.9 F (36.6 C)     SpO2: 100% 99%    Weight:   76.7 kg    General: Not in acute distress.  Dry mucous and membrane HEENT:       Eyes: PERRL, EOMI, no scleral icterus.       ENT: No discharge from the ears and nose, no pharynx injection, no tonsillar enlargement.        Neck: No JVD, no bruit, no mass felt. Heme: No neck lymph node enlargement. Cardiac: S1/S2, RRR, tachycardia, no murmurs, No gallops or rubs. Respiratory: No rales, wheezing, rhonchi or rubs. GI: Soft, nondistended, has diffused mild tenderness, no rebound pain, no organomegaly, BS present. GU: No hematuria Ext: No pitting leg edema bilaterally. 1+DP/PT pulse bilaterally. Musculoskeletal: No joint deformities, No joint redness  or warmth, no limitation of ROM in spin. Skin: No rashes.  Neuro: drowsy, confusion and agitation, but is still oriented X3, cranial nerves II-XII grossly intact, moves all extremities normally.  Psych: Patient is not psychotic, no suicidal or hemocidal ideation.  Labs on Admission: I have personally reviewed following labs and imaging studies  CBC: Recent Labs  Lab 01/11/21 0909  WBC 12.9*  NEUTROABS 10.9*  HGB 13.8  HCT 43.1  MCV 97.3  PLT 377   Basic Metabolic Panel: Recent Labs  Lab 01/11/21 0909 01/11/21 0913 01/11/21 1110  NA 123*  --  127*  K 5.5*  --  5.6*  CL 89*  --  95*  CO2 11*  --  <7*  GLUCOSE 975*  --  914*  BUN 34*  --  34*  CREATININE 3.19*  --  3.10*  CALCIUM 9.5  --  8.5*  MG  --  2.6*  --   PHOS  --  7.0*  --    GFR: Estimated Creatinine Clearance: 18.7 mL/min (A) (by C-G formula based on SCr of 3.1 mg/dL (H)). Liver Function Tests: Recent Labs  Lab 01/11/21 0909  AST 12*  ALT 9  ALKPHOS 86  BILITOT 1.5*  PROT 7.9  ALBUMIN 4.2   Recent Labs  Lab  01/11/21 0913  LIPASE 23   No results for input(s): AMMONIA in the last 168 hours. Coagulation Profile: Recent Labs  Lab 01/11/21 0913  INR 1.1   Cardiac Enzymes: No results for input(s): CKTOTAL, CKMB, CKMBINDEX, TROPONINI in the last 168 hours. BNP (last 3 results) No results for input(s): PROBNP in the last 8760 hours. HbA1C: No results for input(s): HGBA1C in the last 72 hours. CBG: Recent Labs  Lab 01/11/21 0851 01/11/21 1339  GLUCAP >600* >600*   Lipid Profile: No results for input(s): CHOL, HDL, LDLCALC, TRIG, CHOLHDL, LDLDIRECT in the last 72 hours. Thyroid Function Tests: No results for input(s): TSH, T4TOTAL, FREET4, T3FREE, THYROIDAB in the last 72 hours. Anemia Panel: No results for input(s): VITAMINB12, FOLATE, FERRITIN, TIBC, IRON, RETICCTPCT in the last 72 hours. Urine analysis:    Component Value Date/Time   COLORURINE YELLOW (A) 01/11/2021 1100   APPEARANCEUR CLOUDY (A) 01/11/2021 1100   LABSPEC 1.014 01/11/2021 1100   PHURINE 6.0 01/11/2021 1100   GLUCOSEU >=500 (A) 01/11/2021 1100   HGBUR SMALL (A) 01/11/2021 1100   BILIRUBINUR NEGATIVE 01/11/2021 1100   KETONESUR 80 (A) 01/11/2021 1100   PROTEINUR 100 (A) 01/11/2021 1100   NITRITE NEGATIVE 01/11/2021 1100   LEUKOCYTESUR NEGATIVE 01/11/2021 1100   Sepsis Labs: @LABRCNTIP (procalcitonin:4,lacticidven:4) ) Recent Results (from the past 240 hour(s))  Resp Panel by RT-PCR (Flu A&B, Covid) Nasopharyngeal Swab     Status: None   Collection Time: 01/11/21 11:10 AM   Specimen: Nasopharyngeal Swab; Nasopharyngeal(NP) swabs in vial transport medium  Result Value Ref Range Status   SARS Coronavirus 2 by RT PCR NEGATIVE NEGATIVE Final    Comment: (NOTE) SARS-CoV-2 target nucleic acids are NOT DETECTED.  The SARS-CoV-2 RNA is generally detectable in upper respiratory specimens during the acute phase of infection. The lowest concentration of SARS-CoV-2 viral copies this assay can detect is 138  copies/mL. A negative result does not preclude SARS-Cov-2 infection and should not be used as the sole basis for treatment or other patient management decisions. A negative result may occur with  improper specimen collection/handling, submission of specimen other than nasopharyngeal swab, presence of viral mutation(s) within the areas targeted by this assay, and  inadequate number of viral copies(<138 copies/mL). A negative result must be combined with clinical observations, patient history, and epidemiological information. The expected result is Negative.  Fact Sheet for Patients:  BloggerCourse.com  Fact Sheet for Healthcare Providers:  SeriousBroker.it  This test is no t yet approved or cleared by the [place] FDA and  has been authorized for detection and/or diagnosis of SARS-CoV-2 by FDA under an Emergency Use Authorization (EUA). This EUA will remain  in effect (meaning this test can be used) for the duration of the COVID-19 declaration under Section 564(b)(1) of the Act, 21 U.S.C.section 360bbb-3(b)(1), unless the authorization is terminated  or revoked sooner.       Influenza A by PCR NEGATIVE NEGATIVE Final   Influenza B by PCR NEGATIVE NEGATIVE Final    Comment: (NOTE) The Xpert Xpress SARS-CoV-2/FLU/RSV plus assay is intended as an aid in the diagnosis of influenza from Nasopharyngeal swab specimens and should not be used as a sole basis for treatment. Nasal washings and aspirates are unacceptable for Xpert Xpress SARS-CoV-2/FLU/RSV testing.  Fact Sheet for Patients: BloggerCourse.com  Fact Sheet for Healthcare Providers: SeriousBroker.it  This test is not yet approved or cleared by the [place] FDA and has been authorized for detection and/or diagnosis of SARS-CoV-2 by FDA under an Emergency Use Authorization (EUA). This EUA will remain in effect (meaning  this test can be used) for the duration of the COVID-19 declaration under Section 564(b)(1) of the Act, 21 U.S.C. section 360bbb-3(b)(1), unless the authorization is terminated or revoked.  Performed at [facility], [address] [place], [place] [address]      Radiological Exams on Admission: DG Chest Portable 1 View  Result Date: 01/11/2021 CLINICAL DATA:  Patient c/o SOB, hyperglycemia, and chest pain since this AM. Hx of diabetes, htn, asthmasob EXAM: PORTABLE CHEST 1 VIEW COMPARISON:  None. FINDINGS: Cardiac pad over the LEFT thorax. Normal mediastinum and cardiac silhouette. Normal pulmonary vasculature. No evidence of effusion, infiltrate, or pneumothorax. No acute bony abnormality. IMPRESSION: No acute cardiopulmonary process. Electronically Signed   By: Genevive Bi M.D.   On: 01/11/2021 10:16     EKG: I have personally reviewed.  Supraventricular tachycardia, heart rate 183, QTc 465, low voltage, early R wave progression  Assessment/Plan Principal Problem:   DKA (diabetic ketoacidosis) (HCC) Active Problems:   Acute renal failure superimposed on stage 3a chronic kidney disease (HCC)   Bipolar disorder (HCC)   Essential hypertension   Tobacco abuse   SVT (supraventricular tachycardia) (HCC)   SIRS (systemic inflammatory response syndrome) (HCC)   Hypotension   Abdominal pain   Chest pain   HLD (hyperlipidemia)   Asthma   GERD (gastroesophageal reflux disease)   Hyperkalemia   Alcohol abuse   Type II diabetes mellitus with renal manifestations (HCC)   Acute metabolic encephalopathy   DKA (diabetic ketoacidosis) (HCC): Blood sugar 975, bicarbonate 11, anion gap 23, beta hydroxybutyric acid> 8.0), VBG with pH 7.02. pt has altered mental status.   - Admit to stepdown  - IVF: 1L of LR and 3L of NS bolus - start DKA protocol with BMP q4h - IVF: LR at 125 cc/h, will switch to D5-LR at 125 cc/h when CBG<250 - replete K as needed - Zofran prn nausea  -  NPO  - consult to diabetic educator -I consulted Dr. Jayme Cloud of ICU  SIRS (systemic inflammatory response syndrome): pt has SIRS with WBC 12.9, tachycardia, tachypnea with RR 33.  Lactic acid is elevated 3.5.  No source of infection identified.  Patient was initially hypotensive, which responded to IV fluid resuscitation. -Started vancomycin and cefepime empirically -Follow-up of blood culture and urine culture and UA -will get Procalcitonin and trend lactic acid levels -IVF: as above  Hypotension: Likely due to combination of dehydration and stress. -Patient is on aggressive IV fluid as above -Hold lisinopril due hypotension -Continue Cardizem for SVT (with holding parameters of SBP<95)  GERD: -protonix  Acute renal failure superimposed on stage 3a chronic kidney disease (HCC): Baseline Cre is 0.93-1.25 recently, pt's Cre is 3.19 and BUN 34 on admission. Likely due to dehydration and continuation of ACEI. ATN is also possible due to hypotension - IVF as above - Follow up renal function by BMP - Avoid using renal toxic medications, hypotension and contrast dye (or carefully use) - Hold lisinopril  Abdominal pain: Patient has nausea, vomiting, abdominal pain.  Etiology is not clear. -check lipase -f/u CT-abd/pelvis  Bipolar disorder (HCC) -Wellbutrin  Essential hypertension Hold home lisinopril due to hypotension  Tobacco abuse and alcohol abuse -Nicotine patch -CiWA protocol  SVT (supraventricular tachycardia) (HCC) -Cardizem drip -Check TSH -As needed metoprolol 2.5 mg  Chest pain: Initial troponin negative 6.  Possibly due to demand ischemia. -Trend troponin -As needed Percocet -Check UDS, A1c, FLP -Aspirin 81 mg daily -Lipitor  HLD (hyperlipidemia) -Lipitor  Asthma: The patient has shortness of breath, but no oxygen desaturation.  Chest x-ray negative.  No wheezing on auscultation. -Bronchodilators  Hyperkalemia: Potassium 5.5 due to DKA -Expecting  correction with insulin drip  Acute metabolic encephalopathy: Likely multifactorial etiology, including DKA, SIRS, worsening renal function, SVT, hypotension and abdominal pain, possible drug abuse -Frequent neurochecks -UDS -->positive for cocaine -Follow-up CT head     DVT ppx: SQ Heparin         Code Status: Full code Family Communication:  Yes, patient's  daughter at bed side Disposition Plan:  Anticipate discharge back to previous environment Consults called:  none Admission status and Level of care: Stepdown:     SDU/inpation         Status is: Inpatient  Remains inpatient appropriate because:Inpatient level of care appropriate due to severity of illness  Dispo: The patient is from: [facility] per pt's report              Anticipated d/c is to:  to be determined              Patient currently is not medically stable to d/c.   Difficult to place patient No            Date of Service 01/11/2021    Lorretta Harp [facility]   If 7PM-7AM, please contact night-coverage [email or web address] 01/11/2021, 1:58 PM

## 2021-01-11 NOTE — Progress Notes (Signed)
Patient hypotensive, discussed with Dr. Jayme Cloud. Precedex infusion decreased and 1L NS bolus ordered; Will continue to assess need for central line access.

## 2021-01-11 NOTE — Progress Notes (Signed)
PICC order received.  Secure chat sent and received by Dr Clyde Lundborg re blood cx drawn this am, pt has 2 PIV's at this time per IV flowsheet and a creatinine clearance of 24.  Recommendations made to place a CVC or utilize current PIV's, wait til BCx are negative x 48-72 hours.  Response from VAS Team RN, Barkley Bruns, stating MD is attempting [place] PIV at this time.

## 2021-01-11 NOTE — Progress Notes (Signed)
Ventolin SVN given for coarse exp wheeze

## 2021-01-11 NOTE — ED Provider Notes (Signed)
[facility] Emergency Department Provider Note  ____________________________________________   Event Date/Time   First MD Initiated Contact with Patient 01/11/21 [phone]     (approximate)  I have reviewed the triage vital signs and the nursing notes.   HISTORY  Chief Complaint Shortness of Breath and Chest Pain    HPI DEOSHA WERDEN is a 60 y.o. female with diabetes, bipolar disorder who comes in with concerns for shortness of breath.  Patient called out for shortness of breath and hyperglycemia that started this morning.  Patient not able to tell me when this started.  She is not sure if she has been taking her medications or not.  Patient answers questions but gets agitated consistently asking for something to drink.  Unable to get full HPI due to patient's acuity of condition        Past Medical History:  Diagnosis Date   Asthma    Clostridium difficile colitis    Diabetes mellitus without complication (HCC)    Hypertension    Mental disorder    pt reports 'I have all of them mental disorders'    Patient Active Problem List   Diagnosis Date Noted   Cocaine abuse (HCC)    Acute respiratory distress    SVT (supraventricular tachycardia) (HCC)    Leukocytosis    Hypokalemia    Hypophosphatemia    DKA (diabetic ketoacidosis) (HCC) 08/27/2020   Tobacco abuse 10/04/2019   DKA, type 2 (HCC) 10/02/2019   Essential hypertension 10/02/2019   Bipolar disorder (HCC) 01/17/2019   Acute pancreatitis 05/15/2016   AKI (acute kidney injury) (HCC)    Diabetic ketoacidosis without coma associated with type 2 diabetes mellitus (HCC)    Hyperglycemia due to type 2 diabetes mellitus (HCC) 01/13/2016   UTI (urinary tract infection) 01/13/2016   DKA (diabetic ketoacidoses) 01/08/2016   Hyponatremia 01/08/2016   Dehydration 01/08/2016   Urinary tract infection 01/08/2016   Upper abdominal pain 01/08/2016   Nausea & vomiting 01/08/2016    Past  Surgical History:  Procedure Laterality Date   ABDOMINAL HYSTERECTOMY      Prior to Admission medications   Medication Sig Start Date End Date Taking? Authorizing Provider  albuterol (PROVENTIL HFA;VENTOLIN HFA) 108 (90 Base) MCG/ACT inhaler Inhale 2 puffs into the lungs every 6 (six) hours as needed for wheezing or shortness of breath. 02/18/18   Auburn Bilberry, MD  atorvastatin (LIPITOR) 40 MG tablet TAKE 1 TABLET BY MOUTH AT BEDTIME. Patient taking differently: Take 40 mg by mouth at bedtime. 08/29/20 08/29/21  Darlin Drop, DO  buPROPion (WELLBUTRIN SR) 150 MG 12 hr tablet Take 150 mg by mouth 2 (two) times daily. 10/01/20   [provider]  diltiazem (CARDIZEM CD) 240 MG 24 hr capsule Take 1 capsule (240 mg total) by mouth daily. 10/12/20   Alford Highland, MD  folic acid (FOLVITE) 1 MG tablet Take 1 tablet (1 mg total) by mouth daily. 03/08/18   Gouru, Deanna Artis, MD  gabapentin (NEURONTIN) 100 MG capsule TAKE 1 CAPSULE BY MOUTH 2 TIMES DAILY. Patient taking differently: Take 100 mg by mouth 2 (two) times daily. 08/29/20 08/29/21  Darlin Drop, DO  insulin glargine (LANTUS) 100 UNIT/ML Solostar Pen Inject 36 Units into the skin daily. 11/18/20 02/01/21  Tresa Moore, MD  lisinopril (ZESTRIL) 10 MG tablet Take 10 mg by mouth daily. 10/01/20   [provider]  metFORMIN (GLUCOPHAGE-XR) 500 MG 24 hr tablet TAKE 2 TABLETS (1,000 MG TOTAL) BY  MOUTH DAILY WITH BREAKFAST. 08/29/20 08/29/21  Darlin DropHall, Carole N, DO  Multiple Vitamin (MULTIVITAMIN WITH MINERALS) TABS tablet Take 1 tablet by mouth daily. 03/08/18   Gouru, Deanna ArtisAruna, MD  nicotine (NICODERM CQ - DOSED IN MG/24 HOURS) 21 mg/24hr patch Place 1 patch (21 mg total) onto the skin daily. 11/18/20   Sreenath, Sudheer B, MD  NOVOLOG FLEXPEN 100 UNIT/ML FlexPen INJECT 10 UNITS INTO THE SKIN 3 TIMES DAILY WITH MEALS. 08/29/20 08/29/21  Darlin DropHall, Carole N, DO  omeprazole (PRILOSEC) 20 MG capsule Take 1 capsule by mouth daily. 10/11/20   [provider]  thiamine 100 MG tablet Take 1 tablet (100 mg total) by mouth daily. Patient not taking: No sig reported 10/11/20   Alford HighlandWieting, Richard, MD  vitamin B-12 (CYANOCOBALAMIN) 500 MCG tablet Take 500 mcg by mouth daily.    [provider]    Allergies Patient has no known allergies.  Family History  Problem Relation Age of Onset   Hypertension Mother     Social History Social History   Tobacco Use   Smoking status: Every Day    Packs/day: 0.25    Pack years: 0.00    Types: Cigarettes   Smokeless tobacco: Never  Vaping Use   Vaping Use: Never used  Substance Use Topics   Alcohol use: Yes    Alcohol/week: 40.0 standard drinks    Types: 40 Cans of beer per week    Comment: 3 big bottles of liquor   Drug use: Yes    Types: Marijuana, Cocaine      Review of Systems Unable to get full review of systems due to acuity of situation ______________________   PHYSICAL EXAM:  VITAL SIGNS: ED Triage Vitals  Enc Vitals Group     BP --      Pulse Rate 01/11/21 0844 (!) 184     Resp 01/11/21 0844 (!) 30     Temp --      Temp src --      SpO2 01/11/21 0844 98 %     Weight --      Height --      Head Circumference --      Peak Flow --      Pain Score 01/11/21 0845 6     Pain Loc --      Pain Edu? --      Excl. in GC? --     Constitutional: Sleepy but wakes up and answers questions, alert and oriented x4 Eyes: Conjunctivae are normal. EOMI. Head: Atraumatic. Nose: No congestion/rhinnorhea. Mouth/Throat: Mucous membranes are dry Neck: No stridor. Trachea Midline. FROM Cardiovascular: Tachycardic, grossly normal heart sounds.  Good peripheral circulation. Respiratory: Increased work of breathing, clear lungs Gastrointestinal: Soft and nontender. No distention. No abdominal bruits.  Musculoskeletal: No lower extremity tenderness nor edema.  No joint effusions. Neurologic:  Normal speech and language. No gross focal neurologic deficits are appreciated.   Skin:  Skin is warm, dry and intact. No rash noted. Psychiatric: Mood and affect are normal. Speech and behavior are normal. GU: Deferred   ____________________________________________   LABS (all labs ordered are listed, but only abnormal results are displayed)  Labs Reviewed  BLOOD GAS, VENOUS - Abnormal; Notable for the following components:      Result Value   pH, Ven 7.02 (*)    pCO2, Ven 21 (*)    pO2, Ven 61.0 (*)    Bicarbonate 5.4 (*)    Acid-base deficit 24.1 (*)  All other components within normal limits  CBC WITH DIFFERENTIAL/PLATELET - Abnormal; Notable for the following components:   WBC 12.9 (*)    Neutro Abs 10.9 (*)    Abs Immature Granulocytes 0.09 (*)    All other components within normal limits  LACTIC ACID, PLASMA - Abnormal; Notable for the following components:   Lactic Acid, Venous 3.5 (*)    All other components within normal limits  CBG MONITORING, ED - Abnormal; Notable for the following components:   Glucose-Capillary >600 (*)    All other components within normal limits  CULTURE, BLOOD (ROUTINE X 2)  CULTURE, BLOOD (ROUTINE X 2)  COMPREHENSIVE METABOLIC PANEL  BETA-HYDROXYBUTYRIC ACID  LACTIC ACID, PLASMA  TROPONIN I (HIGH SENSITIVITY)   ____________________________________________   ED ECG REPORT I, Concha Se, the attending physician, personally viewed and interpreted this ECG.  Supraventricular tachycardia rate of 183 without any ST elevation and T wave inversion and lead III, normal intervals ____________________________________________  RADIOLOGY Vela Prose, personally viewed and evaluated these images (plain radiographs) as part of my medical decision making, as well as reviewing the written report by the radiologist.  ED MD interpretation:  no pna   Official radiology report(s): DG Chest Portable 1 View  Result Date: 01/11/2021 CLINICAL DATA:  Patient c/o SOB, hyperglycemia, and chest pain since this AM. Hx of  diabetes, htn, asthmasob EXAM: PORTABLE CHEST 1 VIEW COMPARISON:  None. FINDINGS: Cardiac pad over the LEFT thorax. Normal mediastinum and cardiac silhouette. Normal pulmonary vasculature. No evidence of effusion, infiltrate, or pneumothorax. No acute bony abnormality. IMPRESSION: No acute cardiopulmonary process. Electronically Signed   By: Genevive Bi M.D.   On: 01/11/2021 10:16    ____________________________________________   PROCEDURES  Procedure(s) performed (including Critical Care):  .1-3 Lead EKG Interpretation  Date/Time: 01/11/2021 9:47 AM Performed by: Concha Se, MD Authorized by: Concha Se, MD     Interpretation: abnormal     ECG rate:  180s   ECG rate assessment: tachycardic     Rhythm: sinus tachycardia     Ectopy: none     Conduction: normal   Comments:     Initially appeared to be in SVT but converted to sinus tachycardia  Ultrasound ED Peripheral IV (Provider)  Date/Time: 01/11/2021 9:48 AM Performed by: Concha Se, MD Authorized by: Concha Se, MD   Procedure details:    Indications: multiple failed IV attempts     Skin Prep: chlorhexidine gluconate     Location:  Right AC   Angiocath:  18 G   Bedside Ultrasound Guided: Yes     Images: archived     Patient tolerated procedure without complications: Yes     Dressing applied: Yes   .Critical Care  Date/Time: 01/11/2021 10:54 AM Performed by: Concha Se, MD Authorized by: Concha Se, MD   Critical care provider statement:    Critical care time (minutes):  60   Critical care was time spent personally by me on the following activities:  Discussions with consultants, evaluation of patient's response to treatment, examination of patient, ordering and performing treatments and interventions, ordering and review of laboratory studies, ordering and review of radiographic studies, pulse oximetry, re-evaluation of patient's condition, obtaining history from patient or surrogate and review of old  charts   ____________________________________________   INITIAL IMPRESSION / ASSESSMENT AND PLAN / ED COURSE   KORTLYN KOLTZ was evaluated in Emergency Department on 01/11/2021 for the symptoms described in the  history of present illness. She was evaluated in the context of the global COVID-19 pandemic, which necessitated consideration that the patient might be at risk for infection with the SARS-CoV-2 virus that causes COVID-19. Institutional protocols and algorithms that pertain to the evaluation of patients at risk for COVID-19 are in a state of rapid change based on information released by regulatory bodies including the CDC and federal and state organizations. These policies and algorithms were followed during the patient's care in the ED.     Patient comes in with shortness of breath.  Most likely given patient is in DKA given elevated sugar over 600.  Patient significantly tachycardic and hypotensive.  IV access was obtained and patient was started on 2 L of fluid.  Labs ordered to evaluate for DKA, electrolyte abnormalities, AKI.  Cardiac markers ordered to evaluate for ACS.  Chest x-ray ordered evaluate for pneumonia, pneumothorax although seems less likely.  Patient initially significantly tachycardic to the 180s and appears to be in SVT but I suspect that this is most likely demand in the setting of DKA.  We will trial some fluids first prior to giving any adenosine or cardioversion.  Patient still remains in this rhythm we will consider these alternatives.  We will need to keep patient cardiac monitor to do this.  9:51 AM after getting some fluids patient converted and is now in sinus tachycardic rate of 107, no ST elevation, no T wave inversions, normal intervals  Sugar significantly elevated and anion gap is elevated consistent with DKA.  Potassium is 5.5.  Patient's got 2 L of fluid we will give additional 1 L and start patient on insulin.  Will discuss with hospital team for  admission.    ____________________________________________   FINAL CLINICAL IMPRESSION(S) / ED DIAGNOSES   Final diagnoses:  Diabetic ketoacidosis without coma associated with type 2 diabetes mellitus (HCC)  AKI (acute kidney injury) (HCC)     MEDICATIONS GIVEN DURING THIS VISIT:  Medications  insulin regular, human (MYXREDLIN) 100 units/ 100 mL infusion (has no administration in time range)  lactated ringers infusion (has no administration in time range)  dextrose 5 % in lactated ringers infusion (has no administration in time range)  dextrose 50 % solution 0-50 mL (has no administration in time range)  lactated ringers bolus 1,000 mL (has no administration in time range)  sodium chloride 0.9 % bolus 1,000 mL (0 mLs Intravenous Stopped 01/11/21 1014)  sodium chloride 0.9 % bolus 1,000 mL (0 mLs Intravenous Stopped 01/11/21 1014)  sodium bicarbonate injection 50 mEq (50 mEq Intravenous Given 01/11/21 0940)     ED Discharge Orders     None        Note:  This document was prepared using Dragon voice recognition software and may include unintentional dictation errors.   Concha Se, MD 01/11/21 1056

## 2021-01-11 NOTE — Consult Note (Signed)
NAME:  Pamela Lane, MRN:  [id number], DOB:  [birth year], LOS: 0 ADMISSION DATE:  01/11/2021, CONSULTATION DATE:  01/11/2021 REFERRING MD:  Lorretta Harp, MD CHIEF COMPLAINT:  Shortness of breath, chest pain, nausea/vomiting History of Present Illness:  60 yo F presenting to [facility] ED from a hotel where she is living with her husband. Per ED documentation, patient arrived with complaints of dsypnea and hyperglycemia but was unable to report when this started. Patient was not clear as to whether she has been taking her medications. ED course: Patient intermittently agitated, found to be in DKA & AKI on CKD. She was resuscitated with 3 L of IVF & IV insulin started. Patient initially in SVT which corrected with IVF resuscitation.  Initial vitals: afebrile at 97.9, tachypneic at 30, tachycardic at 184, hypotensive at 73/51 (58), SpO2 stable at 98% on room air. Significant labs: UDS +cocaine, hyponatremic at 123, hyperkalemic at 5.5, AKI on CKD with BUN/Cr.: 34/ 3.19, Chloride 89, acidotic & in DKA with serum bicarb 11 & AG 23, leukocytosis 12.9, Glucose 975, lactic acidosis: 3.4 > 3.7. UA: > 500 glucose, +ketones, + protein, +few bacteria. COVID-19 negative. Troponin flat 5 > 12.  EKG showed no STE, T wave inversions and normal intervals. CTH: negative for acute intracranial abnormality & CT abdomen/pelvis: negative for acute abnormality. CXR: negative for acute cardiopulmonary process. TRH consulted for admission, due to hypotension patient admitted to [facility]. PCCM consulted for assistance with severe DKA management and potential CVL placement. Upon arrival to ICU patient increasingly agitated requiring low dose precedex drip.  Upon chart review this is the patient's 5th hospital admission in 2022, 4 out of 5 admissions for DKA. Pertinent  Medical History  HTN HLD T2DM Asthma GERD Depression Bipolar disorder C. Difficile colitis Pancreatitis Cocaine abuse Tobacco abuse  Significant Hospital  Events: Including procedures, antibiotic start and stop dates in addition to other pertinent events   01/11/21- Admit to SDU in DKA, requiring precedex drip for agitation.  Interim History / Subjective:  Patient resting in bed, responsive to voice but lethargic being sedated on low dose precedex drip. Only complaint at this time is being cold. BP remains marginal, additional liter of NS ordered, may require vasopressors  Objective   Blood pressure (!) 120/52, pulse 99, temperature 97.9 F (36.6 C), resp. rate (!) 28, weight 76.7 kg, SpO2 100 %.       No intake or output data in the 24 hours ending 01/11/21 1542 Filed Weights   01/11/21 1233  Weight: 76.7 kg   Examination: General: Adult female, critically ill, lying in bed, NAD HEENT: MM pink/dry, anicteric, atraumatic, neck supple Neuro: A&O x 2, sedated on precedex, simple commands, PERRL +3, MAE CV: s1s2 RRR, NSR on monitor, no r/m/g Pulm: Regular, non labored on room air, breath sounds clear with expiratory wheezes-BUL & diminished-BLL GI: soft, rounded, non tender, bs x 4 GU: foley in place with clear yellow urine Skin: no rashes/lesions noted Extremities: warm/dry, pulses + 2 R/P, no edema noted  Resolved Hospital Problem list   N/A  Assessment & Plan:  Diabetic ketoacidosis in the setting of poorly controlled T2DM Hypovolemic shock PMHx: T2DM- multiple admissions for DKA pH 7.02, CO2 11, Anion Gap 23, serum glucose: 975, Potassium: 5.5 Metal status: agitated on admission meeting criteria for Moderate-Severe DKA Patient received 1 amp of bicarb and 4 L of IVF resuscitation  - DKA protocol initiated by TRH - Continue Aggressive IV hydration (with NS due to hyperkalemia)> additional  1L of NS ordered - consider vasopressors PRN to maintain MAP > 65 - When blood glucose falls below 250 add D5 to IV fluids - Continue Insulin drip ordered per Endo tool protocol - Strict I/O's: alert provider if UOP < 0.5 mL/kg/hr - Q 4 BMP,  closely monitor potassium, replace electrolytes PRN - monitor blood glucose every 1 hour, per Endo tool protocol - trend serum CO2/ AG/ Lactic, f/u A1C - f/u VBG - Diabetes coordinator consult - Resume home insulin regiment once appropriate - hold BP medications with hypotension, considering restarting as patient stabilizes: lisinopril  Acute Kidney Injury secondary to hypovolemia in the setting of DKA >> improving with IVF Hyponatremia: 123 >> improved with IVF : 137 Baseline Cr: 0.93 (at time of last discharge 12/13/20), Cr on admission: 3.19 - Strict I/O's: alert provider if UOP < 0.5 mL/kg/hr - aggressive IVF hydration>> NS continuous infusion & 5 L of resuscitation - Daily BMP, replace electrolytes PRN - Avoid nephrotoxic agents as able, ensure adequate renal perfusion - Consult nephrology if iHD or CRRT indicated  - consider renal US  Acute Metabolic Encephalopathy in the setting of polysubstance abuse PMHx: depression, bipolar disorder and anxiety UDS + for cocaine - continue precedex drip for agitation, wean as tolerated - continue home medications as tolerated: bupropion - supportive care - continue CIWA per protocol with ativan PRN - continue thiamine, folic acid and multi-vitamin  Chest pain and SVT in the setting of hypovolemic shock, DKA & cocaine abuse> resolved  UDS + for cocaine, troponin flat 5 > 12, EKG showed no STE, depression, or t wave inversion - discontinued metoprolol - continue home diltiazem as BP allows - continuous cardiac monitoring  Asthma Tobacco use - bronchodilators PRN - nicotine patch  Hyperlipidemia - continue atorvastatin  Best Practice (right click and "Reselect all SmartList Selections" daily)  Diet/type: NPO DVT prophylaxis: prophylactic heparin  GI prophylaxis: PPI Lines: N/A Foley:  Yes, and it is still needed Code Status:  full code Last date of multidisciplinary goals of care discussion: per primary on admission  Labs    CBC: Recent Labs  Lab 01/11/21 0909  WBC 12.9*  NEUTROABS 10.9*  HGB 13.8  HCT 43.1  MCV 97.3  PLT 377    Basic Metabolic Panel: Recent Labs  Lab 01/11/21 0909 01/11/21 0913 01/11/21 1110 01/11/21 1344  NA 123*  --  127* 130*  K 5.5*  --  5.6* 5.4*  CL 89*  --  95* 98  CO2 11*  --  <7* <7*  GLUCOSE 975*  --  914* 848*  BUN 34*  --  34* 34*  CREATININE 3.19*  --  3.10* 2.81*  CALCIUM 9.5  --  8.5* 9.0  MG  --  2.6*  --   --   PHOS  --  7.0*  --   --    GFR: Estimated Creatinine Clearance: 20.7 mL/min (A) (by C-G formula based on SCr of 2.81 mg/dL (H)). Recent Labs  Lab 01/11/21 0908 01/11/21 0909 01/11/21 0913 01/11/21 1110 01/11/21 1344  PROCALCITON  --   --  0.12  --   --   WBC  --  12.9*  --   --   --   LATICACIDVEN 3.5*  --   --  3.4* 3.7*    Liver Function Tests: Recent Labs  Lab 01/11/21 0909  AST 12*  ALT 9  ALKPHOS 86  BILITOT 1.5*  PROT 7.9  ALBUMIN 4.2   Recent Labs  Lab 01/11/21 0913  LIPASE 23   No results for input(s): AMMONIA in the last 168 hours.  ABG    Component Value Date/Time   PHART 7.34 (L) 10/08/2020 0805   PCO2ART 40 10/08/2020 0805   PO2ART 78 (L) 10/08/2020 0805   HCO3 5.4 (L) 01/11/2021 0911   TCO2 15 01/12/2016 2315   ACIDBASEDEF 24.1 (H) 01/11/2021 0911   O2SAT 75.0 01/11/2021 0911     Coagulation Profile: Recent Labs  Lab 01/11/21 0913  INR 1.1    Cardiac Enzymes: No results for input(s): CKTOTAL, CKMB, CKMBINDEX, TROPONINI in the last 168 hours.  HbA1C: Hgb A1c MFr Bld  Date/Time Value Ref Range Status  11/16/2020 03:52 AM 14.3 (H) 4.8 - 5.6 % Final    Comment:    (NOTE) Pre diabetes:          5.7%-6.4%  Diabetes:              >6.4%  Glycemic control for   <7.0% adults with diabetes   10/08/2020 01:30 AM 13.6 (H) 4.8 - 5.6 % Final    Comment:    (NOTE) Pre diabetes:          5.7%-6.4%  Diabetes:              >6.4%  Glycemic control for   <7.0% adults with diabetes      CBG: Recent Labs  Lab 01/11/21 0851 01/11/21 1339 01/11/21 1407  GLUCAP >600* >600* >600*    Review of Systems:   Patient lethargic from precedex drip, unable to fully participate in interview.  Past Medical History:  She,  has a past medical history of Asthma, Clostridium difficile colitis, Diabetes mellitus without complication (HCC), Hypertension, and Mental disorder.   Surgical History:   Past Surgical History:  Procedure Laterality Date   ABDOMINAL HYSTERECTOMY       Social History:   reports that she has been smoking cigarettes. She has been smoking an average of 0.25 packs per day. She has never used smokeless tobacco. She reports current alcohol use of about 40.0 standard drinks of alcohol per week. She reports current drug use. Drugs: Marijuana and Cocaine.   Family History:  Her family history includes Hypertension in her mother.   Allergies No Known Allergies   Home Medications  Prior to Admission medications   Medication Sig Start Date End Date Taking? Authorizing Provider  nicotine (NICODERM CQ - DOSED IN MG/24 HOURS) 21 mg/24hr patch Place 1 patch (21 mg total) onto the skin daily. 11/18/20  Yes Sreenath, Sudheer B, MD  atorvastatin (LIPITOR) 40 MG tablet TAKE 1 TABLET BY MOUTH AT BEDTIME. Patient taking differently: Take 40 mg by mouth at bedtime. 08/29/20 08/29/21  Darlin DropHall, Carole N, DO  buPROPion (WELLBUTRIN SR) 150 MG 12 hr tablet Take 150 mg by mouth 2 (two) times daily. 10/01/20   [provider]  diltiazem (CARDIZEM CD) 240 MG 24 hr capsule Take 1 capsule (240 mg total) by mouth daily. 10/12/20   Alford HighlandWieting, Richard, MD  folic acid (FOLVITE) 1 MG tablet Take 1 tablet (1 mg total) by mouth daily. Patient not taking: No sig reported 03/08/18   Gouru, Deanna ArtisAruna, MD  gabapentin (NEURONTIN) 100 MG capsule TAKE 1 CAPSULE BY MOUTH 2 TIMES DAILY. Patient taking differently: Take 100 mg by mouth 2 (two) times daily. 08/29/20 08/29/21  Darlin DropHall, Carole N, DO  insulin  glargine (LANTUS) 100 UNIT/ML Solostar Pen Inject 36 Units into the skin daily. 11/18/20 02/01/21  Lolita PatellaSreenath, Sudheer  B, MD  lisinopril (ZESTRIL) 10 MG tablet Take 10 mg by mouth daily. 10/01/20   [provider]  metFORMIN (GLUCOPHAGE-XR) 500 MG 24 hr tablet TAKE 2 TABLETS (1,000 MG TOTAL) BY MOUTH DAILY WITH BREAKFAST. Patient taking differently: Take 1,000 mg by mouth daily with breakfast. 08/29/20 08/29/21  Darlin Drop, DO  Multiple Vitamin (MULTIVITAMIN WITH MINERALS) TABS tablet Take 1 tablet by mouth daily. 03/08/18   Gouru, Deanna Artis, MD  NOVOLOG FLEXPEN 100 UNIT/ML FlexPen INJECT 10 UNITS INTO THE SKIN 3 TIMES DAILY WITH MEALS. Patient taking differently: Inject 10 Units into the skin 3 (three) times daily with meals. 08/29/20 08/29/21  Darlin Drop, DO  omeprazole (PRILOSEC) 20 MG capsule Take 20 mg by mouth daily.    [provider]  thiamine 100 MG tablet Take 1 tablet (100 mg total) by mouth daily. Patient not taking: No sig reported 10/11/20   Alford Highland, MD  vitamin B-12 (CYANOCOBALAMIN) 500 MCG tablet Take 500 mcg by mouth daily.    [provider]     Critical care time: 75 minutes     Betsey Holiday, AGACNP-BC Acute Care Nurse Practitioner [facility]   [phone] / [phone] Please see Amion for pager details.

## 2021-01-11 NOTE — Progress Notes (Signed)
A consult was placed to the hospital's IV Therapist for more access;  pt has 1 peripheral iv;  ER staff requesting 2 more iv sites;  right arm assessed w ultrasound; no healthy ,suitable veins noted; attempted x 1 in Right hand without success;  MD at bedside going to assess pt's left arm with ultrasound.  Pt not a good candidate for a picc line as her CrCl is 24.  Suggest central access for this pt for medications and lab draws.

## 2021-01-11 NOTE — Consult Note (Signed)
Pharmacy Antibiotic Note  Pamela Lane is a 60 y.o. female with medical history including diabetes / DKA, bipolar disorder, substance abuse admitted on 01/11/2021 with sepsis and DKA .  Pharmacy has been consulted for cefepime and vancomycin dosing.  Labs on admission notable for Scr 3.19. Anticipate renal function will improve with fluid resuscitation.  Plan:  Cefepime 2 g IV q24h  Vancomycin 1.75 g IV LD x 1 --Dose per levels given unstable renal function --Check a level in 24-48 hours depending on renal recovery --Daily Scr per protocol   Temp (24hrs), Avg:97.9 F (36.6 C), Min:97.9 F (36.6 C), Max:97.9 F (36.6 C)  Recent Labs  Lab 01/11/21 0908 01/11/21 0909 01/11/21 1110  WBC  --  12.9*  --   CREATININE  --  3.19* 3.10*  LATICACIDVEN 3.5*  --  3.4*    CrCl cannot be calculated (Unknown ideal weight.).    No Known Allergies  Antimicrobials this admission: Vancomycin 7/3 >>  Cefepime 7/3 >>   Dose adjustments this admission: N/A  Microbiology results: 7/3 BCx: pending 7/3 UCx: pending   Thank you for allowing pharmacy to be a part of this patient's care.  Tressie Ellis 01/11/2021 12:17 PM

## 2021-01-11 NOTE — Progress Notes (Signed)
[facility] following sepsis 

## 2021-01-12 DIAGNOSIS — J4520 Mild intermittent asthma, uncomplicated: Secondary | ICD-10-CM

## 2021-01-12 DIAGNOSIS — F3130 Bipolar disorder, current episode depressed, mild or moderate severity, unspecified: Secondary | ICD-10-CM

## 2021-01-12 LAB — GLUCOSE, CAPILLARY
Glucose-Capillary: 144 mg/dL — ABNORMAL HIGH (ref 70–99)
Glucose-Capillary: 157 mg/dL — ABNORMAL HIGH (ref 70–99)
Glucose-Capillary: 159 mg/dL — ABNORMAL HIGH (ref 70–99)
Glucose-Capillary: 159 mg/dL — ABNORMAL HIGH (ref 70–99)
Glucose-Capillary: 161 mg/dL — ABNORMAL HIGH (ref 70–99)
Glucose-Capillary: 163 mg/dL — ABNORMAL HIGH (ref 70–99)
Glucose-Capillary: 166 mg/dL — ABNORMAL HIGH (ref 70–99)
Glucose-Capillary: 172 mg/dL — ABNORMAL HIGH (ref 70–99)
Glucose-Capillary: 174 mg/dL — ABNORMAL HIGH (ref 70–99)
Glucose-Capillary: 175 mg/dL — ABNORMAL HIGH (ref 70–99)
Glucose-Capillary: 178 mg/dL — ABNORMAL HIGH (ref 70–99)
Glucose-Capillary: 180 mg/dL — ABNORMAL HIGH (ref 70–99)
Glucose-Capillary: 185 mg/dL — ABNORMAL HIGH (ref 70–99)
Glucose-Capillary: 188 mg/dL — ABNORMAL HIGH (ref 70–99)
Glucose-Capillary: 197 mg/dL — ABNORMAL HIGH (ref 70–99)
Glucose-Capillary: 256 mg/dL — ABNORMAL HIGH (ref 70–99)
Glucose-Capillary: 275 mg/dL — ABNORMAL HIGH (ref 70–99)
Glucose-Capillary: 289 mg/dL — ABNORMAL HIGH (ref 70–99)

## 2021-01-12 LAB — LIPID PANEL
Cholesterol: 206 mg/dL — ABNORMAL HIGH (ref 0–200)
HDL: 37 mg/dL — ABNORMAL LOW (ref >40)
LDL Cholesterol: 135 mg/dL — ABNORMAL HIGH (ref 0–99)
Total CHOL/HDL Ratio: 5.6 RATIO
Triglycerides: 170 mg/dL — ABNORMAL HIGH (ref <150)
VLDL: 34 mg/dL (ref 0–40)

## 2021-01-12 LAB — BASIC METABOLIC PANEL
Anion gap: 10 (ref 5–15)
Anion gap: 8 (ref 5–15)
Anion gap: 10 (ref 5–15)
BUN: 32 mg/dL — ABNORMAL HIGH (ref 6–20)
BUN: 30 mg/dL — ABNORMAL HIGH (ref 6–20)
BUN: 24 mg/dL — ABNORMAL HIGH (ref 6–20)
CO2: 14 mmol/L — ABNORMAL LOW (ref 22–32)
CO2: 15 mmol/L — ABNORMAL LOW (ref 22–32)
CO2: 12 mmol/L — ABNORMAL LOW (ref 22–32)
Calcium: 8.6 mg/dL — ABNORMAL LOW (ref 8.9–10.3)
Calcium: 8.4 mg/dL — ABNORMAL LOW (ref 8.9–10.3)
Calcium: 8.4 mg/dL — ABNORMAL LOW (ref 8.9–10.3)
Chloride: 117 mmol/L — ABNORMAL HIGH (ref 98–111)
Chloride: 118 mmol/L — ABNORMAL HIGH (ref 98–111)
Chloride: 115 mmol/L — ABNORMAL HIGH (ref 98–111)
Creatinine, Ser: 1.83 mg/dL — ABNORMAL HIGH (ref 0.44–1.00)
Creatinine, Ser: 1.62 mg/dL — ABNORMAL HIGH (ref 0.44–1.00)
Creatinine, Ser: 1.25 mg/dL — ABNORMAL HIGH (ref 0.44–1.00)
GFR, Estimated: 31 mL/min — ABNORMAL LOW (ref >60)
GFR, Estimated: 36 mL/min — ABNORMAL LOW (ref >60)
GFR, Estimated: 50 mL/min — ABNORMAL LOW (ref >60)
Glucose, Bld: 168 mg/dL — ABNORMAL HIGH (ref 70–99)
Glucose, Bld: 222 mg/dL — ABNORMAL HIGH (ref 70–99)
Glucose, Bld: 184 mg/dL — ABNORMAL HIGH (ref 70–99)
Potassium: 4.8 mmol/L (ref 3.5–5.1)
Potassium: 4 mmol/L (ref 3.5–5.1)
Potassium: 3.6 mmol/L (ref 3.5–5.1)
Sodium: 141 mmol/L (ref 135–145)
Sodium: 141 mmol/L (ref 135–145)
Sodium: 137 mmol/L (ref 135–145)

## 2021-01-12 LAB — CBC
HCT: 34.7 % — ABNORMAL LOW (ref 36.0–46.0)
Hemoglobin: 12.2 g/dL (ref 12.0–15.0)
MCH: 32 pg (ref 26.0–34.0)
MCHC: 35.2 g/dL (ref 30.0–36.0)
MCV: 91.1 fL (ref 80.0–100.0)
Platelets: 243 10*3/uL (ref 150–400)
RBC: 3.81 MIL/uL — ABNORMAL LOW (ref 3.87–5.11)
RDW: 12.7 % (ref 11.5–15.5)
WBC: 9.2 10*3/uL (ref 4.0–10.5)
nRBC: 0 % (ref 0.0–0.2)

## 2021-01-12 LAB — LACTIC ACID, PLASMA
Lactic Acid, Venous: 2.8 mmol/L (ref 0.5–1.9)
Lactic Acid, Venous: 1.9 mmol/L (ref 0.5–1.9)

## 2021-01-12 LAB — BLOOD GAS, VENOUS
Acid-base deficit: 12.6 mmol/L — ABNORMAL HIGH (ref 0.0–2.0)
Bicarbonate: 14.4 mmol/L — ABNORMAL LOW (ref 20.0–28.0)
O2 Saturation: 48.6 %
Patient temperature: 37
pCO2, Ven: 36 mmHg — ABNORMAL LOW (ref 44.0–60.0)
pH, Ven: 7.21 — ABNORMAL LOW (ref 7.250–7.430)
pO2, Ven: 33 mmHg (ref 32.0–45.0)

## 2021-01-12 LAB — URINE CULTURE

## 2021-01-12 LAB — MAGNESIUM
Magnesium: 2.2 mg/dL (ref 1.7–2.4)
Magnesium: 2.1 mg/dL (ref 1.7–2.4)
Magnesium: 2 mg/dL (ref 1.7–2.4)

## 2021-01-12 LAB — PHOSPHORUS
Phosphorus: 2.8 mg/dL (ref 2.5–4.6)
Phosphorus: 2.6 mg/dL (ref 2.5–4.6)

## 2021-01-12 LAB — TSH: TSH: 0.282 u[IU]/mL — ABNORMAL LOW (ref 0.350–4.500)

## 2021-01-12 LAB — BETA-HYDROXYBUTYRIC ACID: Beta-Hydroxybutyric Acid: 0.67 mmol/L — ABNORMAL HIGH (ref 0.05–0.27)

## 2021-01-12 MED ORDER — SODIUM CHLORIDE 0.45 % IV SOLN
INTRAVENOUS | Status: DC
  Filled 2021-01-12 (×4): qty 75

## 2021-01-12 MED ORDER — LORAZEPAM 1 MG PO TABS
1.0000 mg | ORAL_TABLET | ORAL | Status: DC | PRN
  Administered 2021-01-13: 1 mg via ORAL
  Filled 2021-01-12: qty 1

## 2021-01-12 MED ORDER — ADULT MULTIVITAMIN W/MINERALS CH: 1.0000 | ORAL_TABLET | Freq: Every day | ORAL | Status: DC

## 2021-01-12 MED ORDER — SODIUM CHLORIDE 0.9 % IV BOLUS
1000.0000 mL | Freq: Once | INTRAVENOUS | Status: AC
  Administered 2021-01-12: 1000 mL via INTRAVENOUS

## 2021-01-12 MED ORDER — LORAZEPAM 2 MG/ML IJ SOLN: 1.0000 mg | INTRAMUSCULAR | Status: DC | PRN

## 2021-01-12 MED ORDER — INSULIN GLARGINE 100 UNIT/ML ~~LOC~~ SOLN
24.0000 [IU] | Freq: Every day | SUBCUTANEOUS | Status: DC
  Filled 2021-01-12: qty 0.24

## 2021-01-12 MED ORDER — INSULIN GLARGINE 100 UNIT/ML ~~LOC~~ SOLN
20.0000 [IU] | Freq: Every day | SUBCUTANEOUS | Status: DC
  Filled 2021-01-12: qty 0.2

## 2021-01-12 MED ORDER — INSULIN GLARGINE 100 UNIT/ML ~~LOC~~ SOLN
20.0000 [IU] | Freq: Once | SUBCUTANEOUS | Status: AC
  Administered 2021-01-12: 20 [IU] via SUBCUTANEOUS
  Filled 2021-01-12: qty 0.2

## 2021-01-12 MED ORDER — INSULIN ASPART 100 UNIT/ML IJ SOLN
0.0000 [IU] | Freq: Three times a day (TID) | INTRAMUSCULAR | Status: DC
  Administered 2021-01-12: 2 [IU] via SUBCUTANEOUS
  Administered 2021-01-13: 1 [IU] via SUBCUTANEOUS
  Administered 2021-01-13: 5 [IU] via SUBCUTANEOUS
  Administered 2021-01-13: 18:00:00 3 [IU] via SUBCUTANEOUS
  Administered 2021-01-14: 08:00:00 7 [IU] via SUBCUTANEOUS
  Filled 2021-01-12 (×5): qty 1

## 2021-01-12 MED ORDER — SODIUM CHLORIDE 0.9 % IV SOLN
2.0000 g | Freq: Two times a day (BID) | INTRAVENOUS | Status: DC
  Filled 2021-01-12 (×2): qty 2

## 2021-01-12 MED ORDER — INSULIN ASPART 100 UNIT/ML IJ SOLN
0.0000 [IU] | Freq: Every day | INTRAMUSCULAR | Status: DC
  Administered 2021-01-12: 3 [IU] via SUBCUTANEOUS
  Administered 2021-01-13: 21:00:00 2 [IU] via SUBCUTANEOUS
  Filled 2021-01-12 (×2): qty 1

## 2021-01-12 MED ORDER — VANCOMYCIN HCL 750 MG/150ML IV SOLN
750.0000 mg | INTRAVENOUS | Status: DC
  Filled 2021-01-12: qty 150

## 2021-01-12 NOTE — Progress Notes (Signed)
Per Dr. Allena Katz, begin transition off IV insulin gtt. Patient tolerating PO intake, started on a carb modified diet. Lantus ordered with plans to give and transition off IV insulin 2 hours post administration.

## 2021-01-12 NOTE — Progress Notes (Signed)
[facility]  - [place] at [facility]   PATIENT NAME: Pamela Lane    MR#:  [id number]  DATE OF BIRTH:  September 13, 1960  SUBJECTIVE:  patient a bit more sleepy although answered most of my questions. History is obtained from charts and patient. Patient has history of noncompliance. Came in from a local hotel with shortness of breath nausea and vomiting. She has found to be in DKA. Placed on IV fluids and insulin drip. Labs look much better. Trying to transition our of IV insulin drip. She feels hungry. No fever. No cough.  REVIEW OF SYSTEMS:   Review of Systems  Constitutional:  Negative for chills, fever and weight loss.  HENT:  Negative for ear discharge, ear pain and nosebleeds.   Eyes:  Negative for blurred vision, pain and discharge.  Respiratory:  Negative for sputum production, shortness of breath, wheezing and stridor.   Cardiovascular:  Negative for chest pain, palpitations, orthopnea and PND.  Gastrointestinal:  Negative for abdominal pain, diarrhea, nausea and vomiting.  Genitourinary:  Negative for frequency and urgency.  Musculoskeletal:  Negative for back pain and joint pain.  Neurological:  Positive for weakness. Negative for sensory change, speech change and focal weakness.  Psychiatric/Behavioral:  Negative for depression and hallucinations. The patient is not nervous/anxious.   Tolerating Diet: Tolerating PT:   DRUG ALLERGIES:  No Known Allergies  VITALS:  Blood pressure 102/69, pulse 87, temperature 98.2 F (36.8 C), temperature source Oral, resp. rate 14, weight 73.3 kg, SpO2 97 %.  PHYSICAL EXAMINATION:   Physical Exam  GENERAL:  60 y.o.-year-old patient lying in the bed with no acute distress. obese HEENT: Head atraumatic, normocephalic. Oropharynx and nasopharynx dry LUNGS: Normal breath sounds bilaterally, no wheezing, rales, rhonchi. No use of accessory muscles of respiration.  CARDIOVASCULAR: S1, S2 normal. No murmurs, rubs, or gallops.   ABDOMEN: Soft, nontender, nondistended. Bowel sounds present. No organomegaly or mass.  EXTREMITIES: No cyanosis, clubbing or edema b/l.    NEUROLOGIC: non focal PSYCHIATRIC:  patient is sleepy but answered questions  SKIN: No obvious rash, lesion, or ulcer.   LABORATORY PANEL:  CBC Recent Labs  Lab 01/12/21 0425  WBC 9.2  HGB 12.2  HCT 34.7*  PLT 243    Chemistries  Recent Labs  Lab 01/11/21 0909 01/11/21 0913 01/12/21 0425 01/12/21 1018  NA 123*   < > 141 137  K 5.5*   < > 4.0 3.6  CL 89*   < > 118* 115*  CO2 11*   < > 15* 12*  GLUCOSE 975*   < > 222* 184*  BUN 34*   < > 30* 24*  CREATININE 3.19*   < > 1.62* 1.25*  CALCIUM 9.5   < > 8.4* 8.4*  MG  --    < > 2.1  --   AST 12*  --   --   --   ALT 9  --   --   --   ALKPHOS 86  --   --   --   BILITOT 1.5*  --   --   --    < > = values in this interval not displayed.   Cardiac Enzymes No results for input(s): TROPONINI in the last 168 hours. RADIOLOGY:  CT ABDOMEN PELVIS WO CONTRAST  Result Date: 01/11/2021 CLINICAL DATA:  Abdominal pain. EXAM: CT ABDOMEN AND PELVIS WITHOUT CONTRAST TECHNIQUE: Multidetector CT imaging of the abdomen and pelvis was performed following the standard protocol without IV contrast.  COMPARISON:  11/16/2020 FINDINGS: Lower chest: No acute abnormality. Hepatobiliary: No focal liver abnormality is seen. No radiopaque gallstones, biliary dilatation, or pericholecystic inflammatory changes. Pancreas: Unremarkable. No pancreatic ductal dilatation or surrounding inflammatory changes. Spleen: Normal in size without focal abnormality. Adrenals/Urinary Tract: Adrenal glands are unremarkable. Kidneys are normal, without renal calculi, focal lesion, or hydronephrosis. Urinary bladder is decompressed by Foley catheter. Stomach/Bowel: Stomach is unremarkable. Small bowel loops are normal in caliber and wall thickness. Moderate stool burden in normal appearing loops of colon. Normal appendix. Vascular/Lymphatic:  There is atherosclerotic calcification of the abdominal aorta. No associated aneurysm. No retroperitoneal or mesenteric adenopathy. Reproductive: Hysterectomy.  No adnexal mass. Other: No free pelvic fluid. Anterior abdominal wall is unremarkable. Musculoskeletal: Degenerative changes in the RIGHT hip and lumbar spine. Disc height loss and uncovertebral spurring with vacuum disc at L5-S1. No lytic or blastic lesions. IMPRESSION: 1.  No evidence for acute abdominal and pelvic abnormality. 2. Moderate stool burden. 3. Normal appendix. 4. Prior hysterectomy. 5.  Aortic atherosclerosis.  (ICD10-I70.0) Electronically Signed   By: Norva Pavlov Lane.   On: 01/11/2021 18:43   CT HEAD WO CONTRAST  Result Date: 01/11/2021 CLINICAL DATA:  Altered mental status. EXAM: CT HEAD WITHOUT CONTRAST TECHNIQUE: Contiguous axial images were obtained from the base of the skull through the vertex without intravenous contrast. COMPARISON:  None. FINDINGS: Brain: No evidence of acute infarction, hemorrhage, hydrocephalus, or extra-axial fluid collection. A 1.3 cm partially calcified extra-axial mass is seen in the left anterior temporoparietal region, consistent with a small meningioma. Vascular:  No hyperdense vessel or other acute findings. Skull: No evidence of fracture or other significant bone abnormality. Sinuses/Orbits:  No acute findings. Other: None. IMPRESSION: No acute intracranial abnormality. 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 Electronically Signed   By: Danae Orleans Lane.   On: 01/11/2021 18:46   DG Chest Portable 1 View  Result Date: 01/11/2021 CLINICAL DATA:  Patient c/o SOB, hyperglycemia, and chest pain since this AM. Hx of diabetes, htn, asthmasob EXAM: PORTABLE CHEST 1 VIEW COMPARISON:  None. FINDINGS: Cardiac pad over the LEFT thorax. Normal mediastinum and cardiac silhouette. Normal pulmonary vasculature. No evidence of effusion, infiltrate, or pneumothorax. No acute bony abnormality.  IMPRESSION: No acute cardiopulmonary process. Electronically Signed   By: Genevive Bi Lane.   On: 01/11/2021 10:16   [place] EKG SITE RITE  Result Date: 01/11/2021 If Site Rite image not attached, placement could not be confirmed due to current cardiac rhythm.  ASSESSMENT AND PLAN:  XOE HOE is a 60 y.o. female with medical history significant of hypertension, hyperlipidemia, diabetes mellitus, asthma, GERD, depression, bipolar, C. difficile colitis, pancreatitis, cocaine abuse, DKA, tobacco abuse, SVT, UTI, CKD-3A, who presents with nausea, vomiting, abdominal pain, shortness of breath, chest pain.   Patient states that she has not been feeling good in the past several days, this morning she started having nausea vomiting, abdominal pain, shortness of breath, chest pain.  She has vomited several times with nonbilious nonbloody vomiting.  DKA (diabetic ketoacidosis) (HCC):  metabolic encephalopathy Blood sugar 975, bicarbonate 11, anion gap 23, beta hydroxybutyric acid> 8.0), VBG with pH 7.02. pt has altered mental status.   - Zofran prn nausea -carb control diet - consult to diabetic educator -patient was started on IV insulin drip per Endo tool. She will be converted now to Lantus 24 units daily along with sliding scale. Since anion gap closed and sugars much improved -- there is some issues with noncompliance  SIRS (systemic inflammatory response syndrome): pt has SIRS with WBC 12.9, tachycardia, tachypnea with RR 33.  Lactic acid is elevated 3.5.  No source of infection identified.  Patient was initially hypotensive, which responded to IV fluid resuscitation. -- lactic acid normalized -- suspect this due to DKA. No source of infection identified. Blood culture negative. Urine no growth. -- I will discontinue IV antibiotics.  Hypotension: Likely due to combination of dehydration and stress. -Patient is on aggressive IV fluid as above -improved -Continue Cardizem for SVT (with  holding parameters of SBP<95)   GERD: -protonix   Acute renal failure superimposed on stage 3a chronic kidney disease (HCC): Baseline Cre is 0.93-1.25 recently, pt's Cre is 3.19 and BUN 34 on admission. Likely due to dehydration and continuation of ACEI. ATN is also possible due to hypotension - IVF as above - Follow up renal function by BMP - Avoid using renal toxic medications, hypotension and contrast dye (or carefully use) - Hold lisinopril -- creatinine improving 1.2 -- start patient on half normal saline with bicarb given acidosis due to DKA   Abdominal pain: Patient has nausea, vomiting, abdominal pain.  Etiology is not clear. -check lipase -f/u CT-abd/pelvis-- nothing acute   Bipolar disorder (HCC) -Wellbutrin   Essential hypertension Hold home lisinopril due to hypotension   Tobacco abuse and alcohol abuse -Nicotine patch -CiWA protocol   SVT (supraventricular tachycardia) (HCC) -Cardizem CD daily -Check TSH -As needed metoprolol 2.5 mg   HLD (hyperlipidemia) -Lipitor   Asthma: The patient has shortness of breath, but no oxygen desaturation.  Chest x-ray negative.  No wheezing on auscultation. -Bronchodilators   Hyperkalemia: Potassium 5.5 due to DKA -Expecting correction with insulin drip -- potassium 3.6.   Acute metabolic encephalopathy: Likely multifactorial etiology, including DKA, SIRS, worsening renal function, SVT, hypotension and abdominal pain, possible drug abuse -Frequent neurochecks -UDS -->positive for cocaine -Follow-up CT head-- nothing acute  no family at bedside. Started on PO diet. Transition to subcu insulin. Will transfer to MedSurg.         DVT ppx: SQ Heparin         Code Status: Full code Family Communication: no family at bedside disposition Plan:  Anticipate discharge back to previous environment Consults called:  none Admission status and Level of care: Stepdown:     SDU/inpation            Status is: Inpatient    Remains inpatient appropriate because:Inpatient level of care appropriate due to severity of illness   Dispo: The patient is from: [facility] per pt's report              Anticipated d/c is to:  to be determined              Patient currently is not medically stable to d/c.              Difficult to place patient No     TOC for discharge planning/substance abuse/home need       TOTAL TIME TAKING CARE OF THIS PATIENT: 35 minutes.  >50% time spent on counselling and coordination of care  Note: This dictation was prepared with Dragon dictation along with smaller phrase technology. Any transcriptional errors that result from this process are unintentional.  Pamela Lane    [facility]   CC: Primary care physician; Center, [facility] Patient ID: Pamela Lane, female   DOB: [birth year], 60 y.o.   MRN: [id number]

## 2021-01-12 NOTE — Progress Notes (Signed)
Inpatient Diabetes Program Recommendations  AACE/ADA: New Consensus Statement on Inpatient Glycemic Control (2015)  Target Ranges:  Prepandial:   less than 140 mg/dL      Peak postprandial:   less than 180 mg/dL (1-2 hours)      Critically ill patients:  140 - 180 mg/dL   Lab Results  Component Value Date   GLUCAP 180 (H) 01/12/2021   HGBA1C 14.3 (H) 11/16/2020    Review of Glycemic Control  Diabetes history: DM2 Outpatient Diabetes medications: Lantus 36 units daily, Novolog 10 units TID with meals, Metformin XR 1000 mg QAM Current orders for Inpatient glycemic control: IV insulin   Inpatient Diabetes Program Recommendations:   Insulin:When ready to transition from IV to SQ insulin, please consider ordering Lantus 24 units Q24H, CBGs Q4H, Novolog 0-20 units Q4H, and if diet advanced Novolog 5 units TID with meals for meal coverage if patient eats at least 50% of meals.  Thank you, Billy Fischer. Annalysia Willenbring, RN, MSN, CDE  Diabetes Coordinator Inpatient Glycemic Control Team Team Pager [phone] (8am-5pm) 01/12/2021 8:48 AM

## 2021-01-13 LAB — BASIC METABOLIC PANEL
Anion gap: 8 (ref 5–15)
BUN: 15 mg/dL (ref 6–20)
CO2: 17 mmol/L — ABNORMAL LOW (ref 22–32)
Calcium: 8.1 mg/dL — ABNORMAL LOW (ref 8.9–10.3)
Chloride: 110 mmol/L (ref 98–111)
Creatinine, Ser: 0.96 mg/dL (ref 0.44–1.00)
GFR, Estimated: >60 mL/min (ref >60)
Glucose, Bld: 281 mg/dL — ABNORMAL HIGH (ref 70–99)
Potassium: 2.8 mmol/L — ABNORMAL LOW (ref 3.5–5.1)
Sodium: 135 mmol/L (ref 135–145)

## 2021-01-13 LAB — HEMOGLOBIN A1C
Hgb A1c MFr Bld: >15.5 % — ABNORMAL HIGH (ref 4.8–5.6)
Mean Plasma Glucose: >398 mg/dL

## 2021-01-13 LAB — BLOOD GAS, VENOUS
Acid-base deficit: 16.4 mmol/L — ABNORMAL HIGH (ref 0.0–2.0)
Bicarbonate: 11.6 mmol/L — ABNORMAL LOW (ref 20.0–28.0)
O2 Saturation: 44.9 %
Patient temperature: 37
pCO2, Ven: 34 mmHg — ABNORMAL LOW (ref 44.0–60.0)
pH, Ven: 7.14 — CL (ref 7.250–7.430)
pO2, Ven: 34 mmHg (ref 32.0–45.0)

## 2021-01-13 LAB — GLUCOSE, CAPILLARY
Glucose-Capillary: 291 mg/dL — ABNORMAL HIGH (ref 70–99)
Glucose-Capillary: 252 mg/dL — ABNORMAL HIGH (ref 70–99)
Glucose-Capillary: 136 mg/dL — ABNORMAL HIGH (ref 70–99)
Glucose-Capillary: 212 mg/dL — ABNORMAL HIGH (ref 70–99)
Glucose-Capillary: 238 mg/dL — ABNORMAL HIGH (ref 70–99)

## 2021-01-13 MED ORDER — INSULIN ASPART 100 UNIT/ML IJ SOLN
8.0000 [IU] | Freq: Three times a day (TID) | INTRAMUSCULAR | Status: DC
  Administered 2021-01-13: 8 [IU] via SUBCUTANEOUS
  Filled 2021-01-13: qty 1

## 2021-01-13 MED ORDER — INSULIN GLARGINE 100 UNIT/ML ~~LOC~~ SOLN
30.0000 [IU] | Freq: Every day | SUBCUTANEOUS | Status: DC
  Administered 2021-01-13: 30 [IU] via SUBCUTANEOUS
  Filled 2021-01-13 (×2): qty 0.3

## 2021-01-13 MED ORDER — POTASSIUM CHLORIDE CRYS ER 20 MEQ PO TBCR
40.0000 meq | EXTENDED_RELEASE_TABLET | ORAL | Status: AC
  Administered 2021-01-13 (×2): 40 meq via ORAL
  Filled 2021-01-13 (×2): qty 2

## 2021-01-13 MED ORDER — LISINOPRIL 10 MG PO TABS
10.0000 mg | ORAL_TABLET | Freq: Every day | ORAL | Status: DC
  Administered 2021-01-13 – 2021-01-14 (×2): 10 mg via ORAL
  Filled 2021-01-13 (×2): qty 1

## 2021-01-13 MED ORDER — INSULIN ASPART 100 UNIT/ML IJ SOLN
10.0000 [IU] | Freq: Three times a day (TID) | INTRAMUSCULAR | Status: DC
  Administered 2021-01-13 – 2021-01-14 (×2): 10 [IU] via SUBCUTANEOUS
  Filled 2021-01-13 (×2): qty 1

## 2021-01-13 MED ORDER — MORPHINE SULFATE (PF) 2 MG/ML IV SOLN
2.0000 mg | INTRAVENOUS | Status: DC | PRN
  Administered 2021-01-13 – 2021-01-14 (×2): 2 mg via INTRAVENOUS
  Filled 2021-01-13 (×2): qty 1

## 2021-01-13 NOTE — Progress Notes (Signed)
[facility]  - [place] at [facility]   PATIENT NAME: Pamela Lane    MR#:  [id number]  DATE OF BIRTH:  13-Oct-1960  SUBJECTIVE:  patient awake alert. Feels overall better. Sugars much better control. Will transfer out of the ICU. Tells me she stays in the hotel with her boyfriend. Does not have money for the house. She tells me she has all her medications including insulin.Marland Kitchen  REVIEW OF SYSTEMS:   Review of Systems  Constitutional:  Negative for chills, fever and weight loss.  HENT:  Negative for ear discharge, ear pain and nosebleeds.   Eyes:  Negative for blurred vision, pain and discharge.  Respiratory:  Negative for sputum production, shortness of breath, wheezing and stridor.   Cardiovascular:  Negative for chest pain, palpitations, orthopnea and PND.  Gastrointestinal:  Negative for abdominal pain, diarrhea, nausea and vomiting.  Genitourinary:  Negative for frequency and urgency.  Musculoskeletal:  Negative for back pain and joint pain.  Neurological:  Positive for weakness. Negative for sensory change, speech change and focal weakness.  Psychiatric/Behavioral:  Negative for depression and hallucinations. The patient is not nervous/anxious.   Tolerating Diet:yes Tolerating PT: ambulatory  DRUG ALLERGIES:  No Known Allergies  VITALS:  Blood pressure 130/65, pulse (!) 101, temperature 98.9 F (37.2 C), resp. rate 18, height 5\' 2"  (1.575 m), weight 73.3 kg, SpO2 100 %.  PHYSICAL EXAMINATION:   Physical Exam  GENERAL:  60 y.o.-year-old patient lying in the bed with no acute distress. obese HEENT: Head atraumatic, normocephalic. Oropharynx and nasopharynx dry LUNGS: Normal breath sounds bilaterally, no wheezing, rales, rhonchi. No use of accessory muscles of respiration.  CARDIOVASCULAR: S1, S2 normal. No murmurs, rubs, or gallops.  ABDOMEN: Soft, nontender, nondistended. Bowel sounds present. No organomegaly or mass.  EXTREMITIES: No cyanosis, clubbing or  edema b/l.    NEUROLOGIC: non focal PSYCHIATRIC:  patient is sleepy but answered questions  SKIN: No obvious rash, lesion, or ulcer.   LABORATORY PANEL:  CBC Recent Labs  Lab 01/12/21 0425  WBC 9.2  HGB 12.2  HCT 34.7*  PLT 243     Chemistries  Recent Labs  Lab 01/11/21 0909 01/11/21 0913 01/12/21 1018 01/13/21 0723  NA 123*   < > 137 135  K 5.5*   < > 3.6 2.8*  CL 89*   < > 115* 110  CO2 11*   < > 12* 17*  GLUCOSE 975*   < > 184* 281*  BUN 34*   < > 24* 15  CREATININE 3.19*   < > 1.25* 0.96  CALCIUM 9.5   < > 8.4* 8.1*  MG  --    < > 2.0  --   AST 12*  --   --   --   ALT 9  --   --   --   ALKPHOS 86  --   --   --   BILITOT 1.5*  --   --   --    < > = values in this interval not displayed.    Cardiac Enzymes No results for input(s): TROPONINI in the last 168 hours. RADIOLOGY:  CT ABDOMEN PELVIS WO CONTRAST  Result Date: 01/11/2021 CLINICAL DATA:  Abdominal pain. EXAM: CT ABDOMEN AND PELVIS WITHOUT CONTRAST TECHNIQUE: Multidetector CT imaging of the abdomen and pelvis was performed following the standard protocol without IV contrast. COMPARISON:  11/16/2020 FINDINGS: Lower chest: No acute abnormality. Hepatobiliary: No focal liver abnormality is seen. No radiopaque gallstones, biliary dilatation,  or pericholecystic inflammatory changes. Pancreas: Unremarkable. No pancreatic ductal dilatation or surrounding inflammatory changes. Spleen: Normal in size without focal abnormality. Adrenals/Urinary Tract: Adrenal glands are unremarkable. Kidneys are normal, without renal calculi, focal lesion, or hydronephrosis. Urinary bladder is decompressed by Foley catheter. Stomach/Bowel: Stomach is unremarkable. Small bowel loops are normal in caliber and wall thickness. Moderate stool burden in normal appearing loops of colon. Normal appendix. Vascular/Lymphatic: There is atherosclerotic calcification of the abdominal aorta. No associated aneurysm. No retroperitoneal or mesenteric  adenopathy. Reproductive: Hysterectomy.  No adnexal mass. Other: No free pelvic fluid. Anterior abdominal wall is unremarkable. Musculoskeletal: Degenerative changes in the RIGHT hip and lumbar spine. Disc height loss and uncovertebral spurring with vacuum disc at L5-S1. No lytic or blastic lesions. IMPRESSION: 1.  No evidence for acute abdominal and pelvic abnormality. 2. Moderate stool burden. 3. Normal appendix. 4. Prior hysterectomy. 5.  Aortic atherosclerosis.  (ICD10-I70.0) Electronically Signed   By: Norva Pavlov M.D.   On: 01/11/2021 18:43   CT HEAD WO CONTRAST  Result Date: 01/11/2021 CLINICAL DATA:  Altered mental status. EXAM: CT HEAD WITHOUT CONTRAST TECHNIQUE: Contiguous axial images were obtained from the base of the skull through the vertex without intravenous contrast. COMPARISON:  None. FINDINGS: Brain: No evidence of acute infarction, hemorrhage, hydrocephalus, or extra-axial fluid collection. A 1.3 cm partially calcified extra-axial mass is seen in the left anterior temporoparietal region, consistent with a small meningioma. Vascular:  No hyperdense vessel or other acute findings. Skull: No evidence of fracture or other significant bone abnormality. Sinuses/Orbits:  No acute findings. Other: None. IMPRESSION: No acute intracranial abnormality. 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 Electronically Signed   By: Danae Orleans M.D.   On: 01/11/2021 18:46   ASSESSMENT AND PLAN:  Pamela Lane is a 60 y.o. female with medical history significant of hypertension, hyperlipidemia, diabetes mellitus, asthma, GERD, depression, bipolar, C. difficile colitis, pancreatitis, cocaine abuse, DKA, tobacco abuse, SVT, UTI, CKD-3A, who presents with nausea, vomiting, abdominal pain, shortness of breath, chest pain.   Patient states that she has not been feeling good in the past several days, this morning she started having nausea vomiting, abdominal pain, shortness of breath, chest  pain.  She has vomited several times with nonbilious nonbloody vomiting.  DKA (diabetic ketoacidosis) (HCC):  Blood sugar 975, bicarbonate 11, anion gap 23, beta hydroxybutyric acid> 8.0), VBG with pH 7.02. pt has altered mental status.   - Zofran prn nausea -carb control diet - consult to diabetic educator -patient was started on IV insulin drip per Endo tool. She will be converted now to Lantus daily along with sliding scale.   Acute metabolic encephalopathy: Likely multifactorial etiology, including DKA, SIRS, worsening renal function, SVT, hypotension and abdominal pain, possible drug abuse -Frequent neurochecks -UDS -->positive for cocaine -Follow-up CT head-- nothing acute --mentation back to baseline   SIRS (systemic inflammatory response syndrome): pt has SIRS with WBC 12.9, tachycardia, tachypnea with RR 33.  Lactic acid is elevated 3.5.  No source of infection identified.  Patient was initially hypotensive, which responded to IV fluid resuscitation. -- lactic acid normalized -- suspect this due to DKA. No source of infection identified. Blood culture negative. Urine no growth. -- no indication for antibiotics.  HTN --resume Lisinopril and cardizem CD   GERD: -protonix   Acute renal failure superimposed on stage 3a chronic kidney disease (HCC): Baseline Cre is 0.93-1.25 recently, pt's Cre is 3.19 and BUN 34 on admission. Likely due to dehydration and  continuation of ACEI. ATN is also possible due to hypotension - IVF as above - Avoid using renal toxic medications, hypotension and contrast dye (or carefully use) - resume lisinopril--creat normal --d/c bicarb gtt   Abdominal pain: Patient has nausea, vomiting, abdominal pain.  Etiology is not clear. -check lipase -f/u CT-abd/pelvis-- nothing acute -abd pain resolved   Bipolar disorder (HCC) -Wellbutrin    Tobacco abuse and alcohol abuse -Nicotine patch -was on CiWA protocol   H/o SVT (supraventricular tachycardia)  (HCC) -Cardizem CD daily  HLD (hyperlipidemia) -Lipitor   Asthma: The patient has shortness of breath, but no oxygen desaturation.  Chest x-ray negative.  No wheezing on auscultation. -Bronchodilators   Hyperkalemia: Potassium 5.5 due to DKA -corrected with insulin drip -- potassium 3.6.    no family at bedside. Started on PO diet. Transition to subcu insulin. Will transfer to MedSurg.         DVT ppx: SQ Heparin         Code Status: Full code Family Communication: no family at bedside disposition Plan:  Anticipate discharge back to previous environment Consults called:  none Admission status and Level of care: Stepdown:     SDU/inpation            Status is: Inpatient   Remains inpatient appropriate because:Inpatient level of care appropriate due to severity of illness   Dispo: The patient is from: [facility] per pt's report              Anticipated d/c is to:  likley 7/6              Patient currently is medically stable to d/c.              Difficult to place patient No     TOC for discharge planning/substance abuse/home need       TOTAL TIME TAKING CARE OF THIS PATIENT: 25 minutes.  >50% time spent on counselling and coordination of care  Note: This dictation was prepared with Dragon dictation along with smaller phrase technology. Any transcriptional errors that result from this process are unintentional.  Enedina Finner M.D    [facility]   CC: Primary care physician; Center, [facility] Patient ID: Pamela Lane, female   DOB: [birth year], 60 y.o.   MRN: [id number]

## 2021-01-14 DIAGNOSIS — R109 Unspecified abdominal pain: Secondary | ICD-10-CM

## 2021-01-14 DIAGNOSIS — E1110 Type 2 diabetes mellitus with ketoacidosis without coma: Principal | ICD-10-CM

## 2021-01-14 DIAGNOSIS — N1831 Chronic kidney disease, stage 3a: Secondary | ICD-10-CM

## 2021-01-14 DIAGNOSIS — F1010 Alcohol abuse, uncomplicated: Secondary | ICD-10-CM

## 2021-01-14 DIAGNOSIS — N179 Acute kidney failure, unspecified: Secondary | ICD-10-CM

## 2021-01-14 LAB — POTASSIUM: Potassium: 3.1 mmol/L — ABNORMAL LOW (ref 3.5–5.1)

## 2021-01-14 LAB — GLUCOSE, CAPILLARY: Glucose-Capillary: 324 mg/dL — ABNORMAL HIGH (ref 70–99)

## 2021-01-14 LAB — HEMOGLOBIN A1C
Hgb A1c MFr Bld: 15 % — ABNORMAL HIGH (ref 4.8–5.6)
Mean Plasma Glucose: 384 mg/dL

## 2021-01-14 MED ORDER — LORAZEPAM 0.5 MG PO TABS
0.5000 mg | ORAL_TABLET | Freq: Four times a day (QID) | ORAL | Status: DC | PRN
  Administered 2021-01-14: 0.5 mg via ORAL
  Filled 2021-01-14: qty 1

## 2021-01-14 MED ORDER — INSULIN GLARGINE 100 UNIT/ML ~~LOC~~ SOLN
35.0000 [IU] | Freq: Every day | SUBCUTANEOUS | Status: DC
  Filled 2021-01-14: qty 0.35

## 2021-01-14 NOTE — Progress Notes (Signed)
Patient was on phone, seem irritated, short in conversation as Chaplain introduced himself. Asked if patient was leaving, patient seem angry. Chaplain offered blessing to her and ended visit.

## 2021-01-14 NOTE — Progress Notes (Signed)
Patient fully dressed and at nurse's station requesting to have PIVs removed stating she was leaving and her "ride was outside." Pt stated, "she came in here and said I was leaving so since ya'll are just gonna kick me out I'm leaving on my own terms." Nurse informed pt she did not currently have a discharge order and blood glucose levels were still quite elevated. Pt became increasingly agitated and stated, "just take them out I'm leaving." PIVs removed and AMA paper signed by patient. MD made aware via secure chat.

## 2021-01-14 NOTE — Progress Notes (Signed)
Pt ambulated to desk this morning sobbing. Pt stated she couldn't get in touch with her sister who is where she wants to go at discharge. She says that if she is discharged she has no where to go. She was admitted from a hotel room per the note. MD was contacted to see about getting her something for anxiety. Awaiting orders.

## 2021-01-14 NOTE — Discharge Summary (Signed)
Pamela Lane ZOX:[id number] DOB: Aug 12, 1960 DOA: 01/11/2021  PCP: [facility]  Admit date: 01/11/2021 Discharge date: 01/14/2021  Admitted From: home Disposition:  Left AMA   Brief/Interim Summary: Per HPI: Pamela Lane is a 60 y.o. female with medical history significant of hypertension, hyperlipidemia, diabetes mellitus, asthma, GERD, depression, bipolar, C. difficile colitis, pancreatitis, cocaine abuse, DKA, tobacco abuse, SVT, UTI, CKD-3A, who presented with nausea, vomiting, abdominal pain, shortness of breath, chest pain.Patient stated that she has not been feeling good in the past several days, this morning she started having nausea vomiting, abdominal pain, shortness of breath, chest pain.  She has vomited several times with nonbilious nonbloody vomiting.  No diarrhea.  Her abdominal pain is diffuse, constant, moderate, sharp, nonradiating.  She denied symptoms of UTI.Per report, pt had tachycardia with heart rate up to 180s.  EKG showed supraventricular tachycardia in ED. Patient was initially hypotensive with blood pressure 71/54, which improved to 99/70 after giving 1L of LR and 2L normal saline in ED. HR improved to 10os, then up to 120-130s in ED.pt was found to have DKA (blood sugar 975, bicarbonate 11, anion gap 23, beta hydroxybutyric acid> 8.0). Patient was admitted to the stepdown unit. Started on DKA protocal.  This am patient was seen and examined. Before I could talk to case management and go back to speak to the patient , patient just walked out of the hospital apparently.    DKA (diabetic ketoacidosis) (HCC):  Blood sugar 975, bicarbonate 11, anion gap 23, beta hydroxybutyric acid> 8.0), VBG with pH 7.02. pt has altered mental status.   - Zofran prn nausea -carb control diet -Diabetic educator consulted.  -patient was started on IV insulin drip per Endo tool.  Then started on insulin.   Acute metabolic encephalopathy: Likely multifactorial  etiology, including DKA, SIRS, worsening renal function, SVT, hypotension and abdominal pain, possible drug abuse -UDS -->positive for cocaine -Follow-up CT head-- nothing acute --mentation back to baseline   SIRS (systemic inflammatory response syndrome): pt has SIRS with WBC 12.9, tachycardia, tachypnea with RR 33.  Lactic acid is elevated 3.5.  No source of infection identified.  Patient was initially hypotensive, which responded to IV fluid resuscitation. -- lactic acid normalized -- suspect this due to DKA. No source of infection identified. Blood culture negative. Urine no growth. -- no indication for antibiotics.   HTN    GERD: Was on protonix   Acute renal failure superimposed on stage 3a chronic kidney disease (HCC): Baseline Cre is 0.93-1.25 recently, pt's Cre is 3.19 and BUN 34 on admission. Likely due to dehydration and continuation of ACEI. ATN is also possible due to hypotension - IVF as above - Avoidrf using renal toxic medications, hypotension and contrast dye (or carefully use) - resume lisinopril--creat normal --d/c bicarb gtt   Abdominal pain: Patient has nausea, vomiting, abdominal pain.  Etiology is not clear. -check lipase -f/u CT-abd/pelvis-- nothing acute -abd pain resolved   Bipolar disorder (HCC) -Wellbutrin    Tobacco abuse and alcohol abuse -was placed on Nicotine patch -was on CiWA protocol   H/o SVT (supraventricular tachycardia) (HCC) Was on Cardizem CD daily   HLD (hyperlipidemia) -was on Lipitor   Asthma: The patient has shortness of breath, but no oxygen desaturation.  Chest x-ray negative.     Hyperkalemia: Potassium 5.5 due to DKA -corrected with insulin drip Labs were ordered this am, but patient left AMA before replacement.      Discharge Diagnoses:  Principal Problem:  DKA (diabetic ketoacidosis) (HCC) Active Problems:   Acute renal failure superimposed on stage 3a chronic kidney disease (HCC)   Bipolar disorder (HCC)    Essential hypertension   Tobacco abuse   SVT (supraventricular tachycardia) (HCC)   SIRS (systemic inflammatory response syndrome) (HCC)   Hypotension   Abdominal pain   Chest pain   HLD (hyperlipidemia)   Asthma   GERD (gastroesophageal reflux disease)   Hyperkalemia   Alcohol abuse   Type II diabetes mellitus with renal manifestations (HCC)   Acute metabolic encephalopathy    Discharge Instructions  Patient walked out of the hospital AMA. Was notified by case manager that pt left AMA   No Known Allergies  Consultations: PCCm   Procedures/Studies: CT ABDOMEN PELVIS WO CONTRAST  Result Date: 01/11/2021 CLINICAL DATA:  Abdominal pain. EXAM: CT ABDOMEN AND PELVIS WITHOUT CONTRAST TECHNIQUE: Multidetector CT imaging of the abdomen and pelvis was performed following the standard protocol without IV contrast. COMPARISON:  11/16/2020 FINDINGS: Lower chest: No acute abnormality. Hepatobiliary: No focal liver abnormality is seen. No radiopaque gallstones, biliary dilatation, or pericholecystic inflammatory changes. Pancreas: Unremarkable. No pancreatic ductal dilatation or surrounding inflammatory changes. Spleen: Normal in size without focal abnormality. Adrenals/Urinary Tract: Adrenal glands are unremarkable. Kidneys are normal, without renal calculi, focal lesion, or hydronephrosis. Urinary bladder is decompressed by Foley catheter. Stomach/Bowel: Stomach is unremarkable. Small bowel loops are normal in caliber and wall thickness. Moderate stool burden in normal appearing loops of colon. Normal appendix. Vascular/Lymphatic: There is atherosclerotic calcification of the abdominal aorta. No associated aneurysm. No retroperitoneal or mesenteric adenopathy. Reproductive: Hysterectomy.  No adnexal mass. Other: No free pelvic fluid. Anterior abdominal wall is unremarkable. Musculoskeletal: Degenerative changes in the RIGHT hip and lumbar spine. Disc height loss and uncovertebral spurring with  vacuum disc at L5-S1. No lytic or blastic lesions. IMPRESSION: 1.  No evidence for acute abdominal and pelvic abnormality. 2. Moderate stool burden. 3. Normal appendix. 4. Prior hysterectomy. 5.  Aortic atherosclerosis.  (ICD10-I70.0) Electronically Signed   By: Norva Pavlov M.D.   On: 01/11/2021 18:43   CT HEAD WO CONTRAST  Result Date: 01/11/2021 CLINICAL DATA:  Altered mental status. EXAM: CT HEAD WITHOUT CONTRAST TECHNIQUE: Contiguous axial images were obtained from the base of the skull through the vertex without intravenous contrast. COMPARISON:  None. FINDINGS: Brain: No evidence of acute infarction, hemorrhage, hydrocephalus, or extra-axial fluid collection. A 1.3 cm partially calcified extra-axial mass is seen in the left anterior temporoparietal region, consistent with a small meningioma. Vascular:  No hyperdense vessel or other acute findings. Skull: No evidence of fracture or other significant bone abnormality. Sinuses/Orbits:  No acute findings. Other: None. IMPRESSION: No acute intracranial abnormality. 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 Electronically Signed   By: Danae Orleans M.D.   On: 01/11/2021 18:46   DG Chest Portable 1 View  Result Date: 01/11/2021 CLINICAL DATA:  Patient c/o SOB, hyperglycemia, and chest pain since this AM. Hx of diabetes, htn, asthmasob EXAM: PORTABLE CHEST 1 VIEW COMPARISON:  None. FINDINGS: Cardiac pad over the LEFT thorax. Normal mediastinum and cardiac silhouette. Normal pulmonary vasculature. No evidence of effusion, infiltrate, or pneumothorax. No acute bony abnormality. IMPRESSION: No acute cardiopulmonary process. Electronically Signed   By: Genevive Bi M.D.   On: 01/11/2021 10:16   [place] EKG SITE RITE  Result Date: 01/11/2021 If Site Rite image not attached, placement could not be confirmed due to current cardiac rhythm.     Subjective:   Discharge  Exam: Vitals:   01/14/21 0414 01/14/21 0752  BP: 130/69 (!) 155/88   Pulse: 91 86  Resp: 18 16  Temp: 98.2 F (36.8 C) 97.8 F (36.6 C)  SpO2: 100% 100%   Vitals:   01/13/21 2043 01/14/21 0014 01/14/21 0414 01/14/21 0752  BP: (!) 117/57 115/60 130/69 (!) 155/88  Pulse: (!) 102 95 91 86  Resp: Temp: 98.3 F (36.8 C) 98.1 F (36.7 C) 98.2 F (36.8 C) 97.8 F (36.6 C)  TempSrc:      SpO2: 98% 97% 100% 100%  Weight:      Height:        General: Pt is alert, awake, not in acute distress Cardiovascular: RRR, S1/S2 +, no rubs, no gallops Respiratory: CTA bilaterally, no wheezing, no rhonchi Abdominal: Soft, NT, ND, bowel sounds + Extremities: no edema    The results of significant diagnostics from this hospitalization (including imaging, microbiology, ancillary and laboratory) are listed below for reference.     Microbiology: Recent Results (from the past 240 hour(s))  Blood culture (routine x 2)     Status: None (Preliminary result)   Collection Time: 01/11/21  9:10 AM   Specimen: BLOOD  Result Value Ref Range Status   Specimen Description BLOOD BLOOD LEFT HAND  Final   Special Requests   Final    BOTTLES DRAWN AEROBIC AND ANAEROBIC Blood Culture adequate volume   Culture   Final    NO GROWTH 3 DAYS Performed at [facility], [address] [place], [place] [address]    Report Status PENDING  Incomplete  Urine Culture     Status: Abnormal   Collection Time: 01/11/21 11:00 AM   Specimen: Urine, Random  Result Value Ref Range Status   Specimen Description   Final    URINE, RANDOM Performed at [facility], [address] [place], [place] [address]    Special Requests   Final    NONE Performed at [facility], [address] [place], [place] [address]    Culture MULTIPLE SPECIES PRESENT, SUGGEST RECOLLECTION (A)  Final   Report Status 01/12/2021 FINAL  Final  Blood culture (routine x 2)     Status: None (Preliminary result)   Collection Time: 01/11/21 11:10 AM   Specimen: BLOOD   Result Value Ref Range Status   Specimen Description BLOOD RIGHT UPPER ARM  Final   Special Requests   Final    BOTTLES DRAWN AEROBIC AND ANAEROBIC Blood Culture results may not be optimal due to an inadequate volume of blood received in culture bottles   Culture   Final    NO GROWTH 3 DAYS Performed at [facility], [address], [place], [place] [address]    Report Status PENDING  Incomplete  Resp Panel by RT-PCR (Flu A&B, Covid) Nasopharyngeal Swab     Status: None   Collection Time: 01/11/21 11:10 AM   Specimen: Nasopharyngeal Swab; Nasopharyngeal(NP) swabs in vial transport medium  Result Value Ref Range Status   SARS Coronavirus 2 by RT PCR NEGATIVE NEGATIVE Final    Comment: (NOTE) SARS-CoV-2 target nucleic acids are NOT DETECTED.  The SARS-CoV-2 RNA is generally detectable in upper respiratory specimens during the acute phase of infection. The lowest concentration of SARS-CoV-2 viral copies this assay can detect is 138 copies/mL. A negative result does not preclude SARS-Cov-2 infection and should not be used as the sole basis for treatment or other patient management decisions. A negative  result may occur with  improper specimen collection/handling, submission of specimen other than nasopharyngeal swab, presence of viral mutation(s) within the areas targeted by this assay, and inadequate number of viral copies(<138 copies/mL). A negative result must be combined with clinical observations, patient history, and epidemiological information. The expected result is Negative.  Fact Sheet for Patients:  BloggerCourse.comhttps://www.fda.gov/media/152166/download  Fact Sheet for Healthcare Providers:  SeriousBroker.ithttps://www.fda.gov/media/152162/download  This test is no t yet approved or cleared by the [place] FDA and  has been authorized for detection and/or diagnosis of SARS-CoV-2 by FDA under an Emergency Use Authorization (EUA). This EUA will remain  in effect (meaning this test can  be used) for the duration of the COVID-19 declaration under Section 564(b)(1) of the Act, 21 U.S.C.section 360bbb-3(b)(1), unless the authorization is terminated  or revoked sooner.       Influenza A by PCR NEGATIVE NEGATIVE Final   Influenza B by PCR NEGATIVE NEGATIVE Final    Comment: (NOTE) The Xpert Xpress SARS-CoV-2/FLU/RSV plus assay is intended as an aid in the diagnosis of influenza from Nasopharyngeal swab specimens and should not be used as a sole basis for treatment. Nasal washings and aspirates are unacceptable for Xpert Xpress SARS-CoV-2/FLU/RSV testing.  Fact Sheet for Patients: BloggerCourse.comhttps://www.fda.gov/media/152166/download  Fact Sheet for Healthcare Providers: SeriousBroker.ithttps://www.fda.gov/media/152162/download  This test is not yet approved or cleared by the [place] FDA and has been authorized for detection and/or diagnosis of SARS-CoV-2 by FDA under an Emergency Use Authorization (EUA). This EUA will remain in effect (meaning this test can be used) for the duration of the COVID-19 declaration under Section 564(b)(1) of the Act, 21 U.S.C. section 360bbb-3(b)(1), unless the authorization is terminated or revoked.  Performed at [facility], [address] [place], [place] [address]   MRSA Next Gen by PCR, Nasal     Status: None   Collection Time: 01/11/21  4:05 PM   Specimen: Nasal Mucosa; Nasal Swab  Result Value Ref Range Status   MRSA by PCR Next Gen NOT DETECTED NOT DETECTED Final    Comment: (NOTE) The GeneXpert MRSA Assay (FDA approved for NASAL specimens only), is one component of a comprehensive MRSA colonization surveillance program. It is not intended to diagnose MRSA infection nor to guide or monitor treatment for MRSA infections. Test performance is not FDA approved in patients less than 59 years old. Performed at [facility], [address] [place], [place] [address]      Labs: BNP (last 3 results) Recent Labs     10/07/20 2219  BNP 88.9   Basic Metabolic Panel: Recent Labs  Lab 01/11/21 0913 01/11/21 1110 01/11/21 1937 01/12/21 0111 01/12/21 0425 01/12/21 1018 01/13/21 0723 01/14/21 0844  NA  --    < > 137 141 141 137 135  --   K  --    < > 4.8 4.8 4.0 3.6 2.8* 3.1*  CL  --    < > 109 117* 118* 115* 110  --   CO2  --    < > 8* 14* 15* 12* 17*  --   GLUCOSE  --    < > 507* 168* 222* 184* 281*  --   BUN  --    < > 34* 32* 30* 24* 15  --   CREATININE  --    < > 2.54* 1.83* 1.62* 1.25* 0.96  --   CALCIUM  --    < > 8.7* 8.6* 8.4* 8.4* 8.1*  --   MG 2.6*  --   --  2.2 2.1 2.0  --   --   PHOS 7.0*  --   --  2.8 2.6  --   --   --    < > = values in this interval not displayed.   Liver Function Tests: Recent Labs  Lab 01/11/21 0909  AST 12*  ALT 9  ALKPHOS 86  BILITOT 1.5*  PROT 7.9  ALBUMIN 4.2   Recent Labs  Lab 01/11/21 0913  LIPASE 23   No results for input(s): AMMONIA in the last 168 hours. CBC: Recent Labs  Lab 01/11/21 0909 01/12/21 0425  WBC 12.9* 9.2  NEUTROABS 10.9*  --   HGB 13.8 12.2  HCT 43.1 34.7*  MCV 97.3 91.1  PLT 377 243   Cardiac Enzymes: No results for input(s): CKTOTAL, CKMB, CKMBINDEX, TROPONINI in the last 168 hours. BNP: Invalid input(s): POCBNP CBG: Recent Labs  Lab 01/13/21 0741 01/13/21 1152 01/13/21 1729 01/13/21 2044 01/14/21 0754  GLUCAP 252* 136* 212* 238* 324*   D-Dimer No results for input(s): DDIMER in the last 72 hours. Hgb A1c Recent Labs    01/11/21 1344 01/12/21 1018  HGBA1C >15.5* 15.0*   Lipid Profile Recent Labs    01/12/21 0425  CHOL 206*  HDL 37*  LDLCALC 135*  TRIG 170*  CHOLHDL 5.6   Thyroid function studies Recent Labs    01/12/21 0425  TSH 0.282*   Anemia work up No results for input(s): VITAMINB12, FOLATE, FERRITIN, TIBC, IRON, RETICCTPCT in the last 72 hours. Urinalysis    Component Value Date/Time   COLORURINE YELLOW (A) 01/11/2021 1100   APPEARANCEUR CLOUDY (A) 01/11/2021 1100    LABSPEC 1.014 01/11/2021 1100   PHURINE 6.0 01/11/2021 1100   GLUCOSEU >=500 (A) 01/11/2021 1100   HGBUR SMALL (A) 01/11/2021 1100   BILIRUBINUR NEGATIVE 01/11/2021 1100   KETONESUR 80 (A) 01/11/2021 1100   PROTEINUR 100 (A) 01/11/2021 1100   NITRITE NEGATIVE 01/11/2021 1100   LEUKOCYTESUR NEGATIVE 01/11/2021 1100   Sepsis Labs Invalid input(s): PROCALCITONIN,  WBC,  LACTICIDVEN Microbiology Recent Results (from the past 240 hour(s))  Blood culture (routine x 2)     Status: None (Preliminary result)   Collection Time: 01/11/21  9:10 AM   Specimen: BLOOD  Result Value Ref Range Status   Specimen Description BLOOD BLOOD LEFT HAND  Final   Special Requests   Final    BOTTLES DRAWN AEROBIC AND ANAEROBIC Blood Culture adequate volume   Culture   Final    NO GROWTH 3 DAYS Performed at [facility], [address], [place], [place] [address]    Report Status PENDING  Incomplete  Urine Culture     Status: Abnormal   Collection Time: 01/11/21 11:00 AM   Specimen: Urine, Random  Result Value Ref Range Status   Specimen Description   Final    URINE, RANDOM Performed at [facility], [address], [place], [place] [address]    Special Requests   Final    NONE Performed at [facility], [address] [place], [place] [address]    Culture MULTIPLE SPECIES PRESENT, SUGGEST RECOLLECTION (A)  Final   Report Status 01/12/2021 FINAL  Final  Blood culture (routine x 2)     Status: None (Preliminary result)   Collection Time: 01/11/21 11:10 AM   Specimen: BLOOD  Result Value Ref Range Status   Specimen Description BLOOD RIGHT UPPER ARM  Final   Special Requests   Final    BOTTLES DRAWN  AEROBIC AND ANAEROBIC Blood Culture results may not be optimal due to an inadequate volume of blood received in culture bottles   Culture   Final    NO GROWTH 3 DAYS Performed at [facility], [address] [place], [place] [address]    Report Status PENDING   Incomplete  Resp Panel by RT-PCR (Flu A&B, Covid) Nasopharyngeal Swab     Status: None   Collection Time: 01/11/21 11:10 AM   Specimen: Nasopharyngeal Swab; Nasopharyngeal(NP) swabs in vial transport medium  Result Value Ref Range Status   SARS Coronavirus 2 by RT PCR NEGATIVE NEGATIVE Final    Comment: (NOTE) SARS-CoV-2 target nucleic acids are NOT DETECTED.  The SARS-CoV-2 RNA is generally detectable in upper respiratory specimens during the acute phase of infection. The lowest concentration of SARS-CoV-2 viral copies this assay can detect is 138 copies/mL. A negative result does not preclude SARS-Cov-2 infection and should not be used as the sole basis for treatment or other patient management decisions. A negative result may occur with  improper specimen collection/handling, submission of specimen other than nasopharyngeal swab, presence of viral mutation(s) within the areas targeted by this assay, and inadequate number of viral copies(<138 copies/mL). A negative result must be combined with clinical observations, patient history, and epidemiological information. The expected result is Negative.  Fact Sheet for Patients:  BloggerCourse.com  Fact Sheet for Healthcare Providers:  SeriousBroker.it  This test is no t yet approved or cleared by the [place] FDA and  has been authorized for detection and/or diagnosis of SARS-CoV-2 by FDA under an Emergency Use Authorization (EUA). This EUA will remain  in effect (meaning this test can be used) for the duration of the COVID-19 declaration under Section 564(b)(1) of the Act, 21 U.S.C.section 360bbb-3(b)(1), unless the authorization is terminated  or revoked sooner.       Influenza A by PCR NEGATIVE NEGATIVE Final   Influenza B by PCR NEGATIVE NEGATIVE Final    Comment: (NOTE) The Xpert Xpress SARS-CoV-2/FLU/RSV plus assay is intended as an aid in the diagnosis of influenza  from Nasopharyngeal swab specimens and should not be used as a sole basis for treatment. Nasal washings and aspirates are unacceptable for Xpert Xpress SARS-CoV-2/FLU/RSV testing.  Fact Sheet for Patients: BloggerCourse.com  Fact Sheet for Healthcare Providers: SeriousBroker.it  This test is not yet approved or cleared by the [place] FDA and has been authorized for detection and/or diagnosis of SARS-CoV-2 by FDA under an Emergency Use Authorization (EUA). This EUA will remain in effect (meaning this test can be used) for the duration of the COVID-19 declaration under Section 564(b)(1) of the Act, 21 U.S.C. section 360bbb-3(b)(1), unless the authorization is terminated or revoked.  Performed at [facility], [address] [place], [place] [address]   MRSA Next Gen by PCR, Nasal     Status: None   Collection Time: 01/11/21  4:05 PM   Specimen: Nasal Mucosa; Nasal Swab  Result Value Ref Range Status   MRSA by PCR Next Gen NOT DETECTED NOT DETECTED Final    Comment: (NOTE) The GeneXpert MRSA Assay (FDA approved for NASAL specimens only), is one component of a comprehensive MRSA colonization surveillance program. It is not intended to diagnose MRSA infection nor to guide or monitor treatment for MRSA infections. Test performance is not FDA approved in patients less than 21 years old. Performed at [facility], [address], [place], [place] [address]      Time coordinating discharge:  Over 30 minutes  SIGNED:   Lynn Ito, MD  [facility] 01/14/2021, 12:49 PM Pager   If 7PM-7AM, please contact night-coverage [email or web address] Password [email or web address]

## 2021-01-14 NOTE — TOC Initial Note (Signed)
Transition of Care [facility]) - Initial/Assessment Note    Patient Details  Name: Pamela Lane MRN: [id number] Date of Birth: 08-11-1960  Transition of Care [facility] CM/SW Contact:    Allayne Butcher, RN Phone Number: 01/14/2021, 11:06 AM  Clinical Narrative:                 Patient admitted to the hospital with DKA, patient also noted to be positive for cocaine on drug screen.  TOC consult for substance abuse and homelessness.  Patient endorses that she does not know where she will go when discharged.  RNCM provided patient with information on the homeless shelter and [facility] and [facility].  Patient seems disinterested in resources and just asks where her discharge paperwork is.   Patient left AMA before discharge could be completed.  Per the bedside RN patient found a ride and a place to go with her daughter.    Expected Discharge Plan: Home/Self Care Barriers to Discharge: No Barriers Identified   Patient Goals and CMS Choice Patient states their goals for this hospitalization and ongoing recovery are:: Patient does not state goals      Expected Discharge Plan and Services Expected Discharge Plan: Home/Self Care   Discharge Planning Services: CM Consult   Living arrangements for the past 2 months: Homeless                 DME Arranged: N/A DME Agency: NA       HH Arranged: NA          Prior Living Arrangements/Services Living arrangements for the past 2 months: Homeless Lives with:: Self Patient language and need for interpreter reviewed:: Yes Do you feel safe going back to the place where you live?: No   Does not know where she will go when she leaves hospital  Need for Family Participation in Patient Care: Yes (Comment) Care giver support system in place?: No (comment)   Criminal Activity/Legal Involvement Pertinent to Current Situation/Hospitalization: No - Comment as needed  Activities of Daily Living Home Assistive Devices/Equipment: Eyeglasses ADL  Screening (condition at time of admission) Patient's cognitive ability adequate to safely complete daily activities?: Yes Is the patient deaf or have difficulty hearing?: No Does the patient have difficulty seeing, even when wearing glasses/contacts?: No Does the patient have difficulty concentrating, remembering, or making decisions?: No Patient able to express need for assistance with ADLs?: Yes Does the patient have difficulty dressing or bathing?: No Independently performs ADLs?: Yes (appropriate for developmental age) Does the patient have difficulty walking or climbing stairs?: No Weakness of Legs: None Weakness of Arms/Hands: None  Permission Sought/Granted   Permission granted to share information with : No              Emotional Assessment Appearance:: Appears stated age Attitude/Demeanor/Rapport: Avoidant, Self-Absorbed Affect (typically observed): Agitated, Angry Orientation: : Oriented to Self, Oriented to Place, Oriented to  Time, Oriented to Situation Alcohol / Substance Use: Illicit Drugs Psych Involvement: No (comment)  Admission diagnosis:  DKA (diabetic ketoacidosis) (HCC) [E11.10] AKI (acute kidney injury) (HCC) [N17.9] Diabetic ketoacidosis without coma associated with type 2 diabetes mellitus (HCC) [E11.10] Patient Active Problem List   Diagnosis Date Noted   SIRS (systemic inflammatory response syndrome) (HCC) 01/11/2021   Hypotension 01/11/2021   Abdominal pain 01/11/2021   Chest pain 01/11/2021   HLD (hyperlipidemia) 01/11/2021   Asthma 01/11/2021   GERD (gastroesophageal reflux disease) 01/11/2021   Hyperkalemia 01/11/2021   Alcohol abuse 01/11/2021  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   Acute metabolic encephalopathy 01/11/2021   Cocaine abuse (HCC)    Acute respiratory distress    SVT (supraventricular tachycardia) (HCC)    Leukocytosis    Hypokalemia    Hypophosphatemia    DKA (diabetic ketoacidosis) (HCC)  08/27/2020   Tobacco abuse 10/04/2019   DKA, type 2 (HCC) 10/02/2019   Essential hypertension 10/02/2019   Bipolar disorder (HCC) 01/17/2019   Acute pancreatitis 05/15/2016   Acute renal failure superimposed on stage 3a chronic kidney disease (HCC)    Diabetic ketoacidosis without coma associated with type 2 diabetes mellitus (HCC)    Hyperglycemia due to type 2 diabetes mellitus (HCC) 01/13/2016   UTI (urinary tract infection) 01/13/2016   DKA (diabetic ketoacidoses) 01/08/2016   Hyponatremia 01/08/2016   Dehydration 01/08/2016   Urinary tract infection 01/08/2016   Upper abdominal pain 01/08/2016   Nausea & vomiting 01/08/2016   PCP:  [facility] Pharm[facility]:   [facility] [address]), [place] - [address] [place] ([place]) [place] [address] Phone: [phone] Fax: [phone]  [facility] [place], [place] - [address] [place] [address] Phone: [phone] Fax: [phone]  [facility] - [place], [place] - [address] [place] [address] [place] [address] Phone: [phone] Fax: [phone]     Social Determinants of Health (SDOH) Interventions    Readmission Risk Interventions Readmission Risk Prevention Plan 01/14/2021 10/10/2020 10/04/2019  Transportation Screening Complete Complete Complete  PCP or Specialist Appt within 3-5 Days - Complete Complete  HRI or Home Care Consult - Complete Complete  Social Work Consult for Recovery Care Planning/Counseling - Complete -  Palliative Care Screening - Not Applicable -  Medication Review (RN Care Manager) Referral to Pharmacy Complete Complete  PCP or Specialist appointment within 3-5 days of discharge Not Complete - -  PCP/Specialist Appt Not Complete comments patient left AMA - -  HRI or Home Care Consult Not Complete - -  HRI or Home Care Consult Pt Refusal Comments  patient is independent - -  SW Recovery Care/Counseling Consult Complete - -  Palliative Care Screening Not Applicable - -  Skilled Nursing Facility Not Applicable - -  Some recent data might be hidden

## 2021-01-16 LAB — CULTURE, BLOOD (ROUTINE X 2)
Culture: NO GROWTH
Culture: NO GROWTH
Special Requests: ADEQUATE

## 2022-03-13 ENCOUNTER — Emergency Department: Payer: Medicaid Other

## 2022-03-13 ENCOUNTER — Other Ambulatory Visit: Payer: Self-pay

## 2022-03-13 ENCOUNTER — Encounter: Payer: Self-pay | Admitting: Emergency Medicine

## 2022-03-13 ENCOUNTER — Inpatient Hospital Stay
Admission: EM | Admit: 2022-03-13 | Discharge: 2022-03-15 | DRG: 871 | Disposition: A | Discharge status: Home / Self Care | Payer: Medicaid Other | Attending: Internal Medicine | Admitting: Internal Medicine

## 2022-03-13 DIAGNOSIS — Z7984 Long term (current) use of oral hypoglycemic drugs: Secondary | ICD-10-CM

## 2022-03-13 DIAGNOSIS — N3000 Acute cystitis without hematuria: Secondary | ICD-10-CM

## 2022-03-13 DIAGNOSIS — E785 Hyperlipidemia, unspecified: Secondary | ICD-10-CM | POA: Diagnosis present

## 2022-03-13 DIAGNOSIS — E876 Hypokalemia: Secondary | ICD-10-CM | POA: Diagnosis present

## 2022-03-13 DIAGNOSIS — A419 Sepsis, unspecified organism: Principal | ICD-10-CM | POA: Diagnosis present

## 2022-03-13 DIAGNOSIS — Z90710 Acquired absence of both cervix and uterus: Secondary | ICD-10-CM

## 2022-03-13 DIAGNOSIS — E870 Hyperosmolality and hypernatremia: Secondary | ICD-10-CM | POA: Diagnosis not present

## 2022-03-13 DIAGNOSIS — E1122 Type 2 diabetes mellitus with diabetic chronic kidney disease: Secondary | ICD-10-CM | POA: Diagnosis present

## 2022-03-13 DIAGNOSIS — N179 Acute kidney failure, unspecified: Secondary | ICD-10-CM | POA: Diagnosis present

## 2022-03-13 DIAGNOSIS — E871 Hypo-osmolality and hyponatremia: Secondary | ICD-10-CM | POA: Diagnosis present

## 2022-03-13 DIAGNOSIS — F1410 Cocaine abuse, uncomplicated: Secondary | ICD-10-CM | POA: Diagnosis present

## 2022-03-13 DIAGNOSIS — Z79899 Other long term (current) drug therapy: Secondary | ICD-10-CM

## 2022-03-13 DIAGNOSIS — R6520 Severe sepsis without septic shock: Secondary | ICD-10-CM | POA: Diagnosis present

## 2022-03-13 DIAGNOSIS — E875 Hyperkalemia: Secondary | ICD-10-CM | POA: Diagnosis present

## 2022-03-13 DIAGNOSIS — I129 Hypertensive chronic kidney disease with stage 1 through stage 4 chronic kidney disease, or unspecified chronic kidney disease: Secondary | ICD-10-CM | POA: Diagnosis present

## 2022-03-13 DIAGNOSIS — N390 Urinary tract infection, site not specified: Secondary | ICD-10-CM | POA: Diagnosis present

## 2022-03-13 DIAGNOSIS — F319 Bipolar disorder, unspecified: Secondary | ICD-10-CM | POA: Diagnosis present

## 2022-03-13 DIAGNOSIS — K219 Gastro-esophageal reflux disease without esophagitis: Secondary | ICD-10-CM | POA: Diagnosis present

## 2022-03-13 DIAGNOSIS — Z8249 Family history of ischemic heart disease and other diseases of the circulatory system: Secondary | ICD-10-CM

## 2022-03-13 DIAGNOSIS — Z20822 Contact with and (suspected) exposure to covid-19: Secondary | ICD-10-CM | POA: Diagnosis present

## 2022-03-13 DIAGNOSIS — F17210 Nicotine dependence, cigarettes, uncomplicated: Secondary | ICD-10-CM | POA: Diagnosis present

## 2022-03-13 DIAGNOSIS — E1110 Type 2 diabetes mellitus with ketoacidosis without coma: Secondary | ICD-10-CM | POA: Diagnosis present

## 2022-03-13 DIAGNOSIS — E1310 Other specified diabetes mellitus with ketoacidosis without coma: Principal | ICD-10-CM

## 2022-03-13 DIAGNOSIS — N1831 Chronic kidney disease, stage 3a: Secondary | ICD-10-CM | POA: Diagnosis present

## 2022-03-13 DIAGNOSIS — R531 Weakness: Secondary | ICD-10-CM | POA: Diagnosis present

## 2022-03-13 DIAGNOSIS — Z794 Long term (current) use of insulin: Secondary | ICD-10-CM | POA: Diagnosis not present

## 2022-03-13 LAB — BASIC METABOLIC PANEL
Anion gap: 23 — ABNORMAL HIGH (ref 5–15)
Anion gap: 23 — ABNORMAL HIGH (ref 5–15)
Anion gap: 20 — ABNORMAL HIGH (ref 5–15)
BUN: 55 mg/dL — ABNORMAL HIGH (ref 6–20)
BUN: 54 mg/dL — ABNORMAL HIGH (ref 6–20)
BUN: 47 mg/dL — ABNORMAL HIGH (ref 6–20)
CO2: 9 mmol/L — ABNORMAL LOW (ref 22–32)
CO2: 12 mmol/L — ABNORMAL LOW (ref 22–32)
CO2: 11 mmol/L — ABNORMAL LOW (ref 22–32)
Calcium: 8.7 mg/dL — ABNORMAL LOW (ref 8.9–10.3)
Calcium: 9.6 mg/dL (ref 8.9–10.3)
Calcium: 9.6 mg/dL (ref 8.9–10.3)
Chloride: 100 mmol/L (ref 98–111)
Chloride: 105 mmol/L (ref 98–111)
Chloride: 110 mmol/L (ref 98–111)
Creatinine, Ser: 2.03 mg/dL — ABNORMAL HIGH (ref 0.44–1.00)
Creatinine, Ser: 1.88 mg/dL — ABNORMAL HIGH (ref 0.44–1.00)
Creatinine, Ser: 1.63 mg/dL — ABNORMAL HIGH (ref 0.44–1.00)
GFR, Estimated: 28 mL/min — ABNORMAL LOW (ref >60)
GFR, Estimated: 30 mL/min — ABNORMAL LOW (ref >60)
GFR, Estimated: 36 mL/min — ABNORMAL LOW (ref >60)
Glucose, Bld: 779 mg/dL (ref 70–99)
Glucose, Bld: 366 mg/dL — ABNORMAL HIGH (ref 70–99)
Glucose, Bld: 254 mg/dL — ABNORMAL HIGH (ref 70–99)
Potassium: 6.5 mmol/L (ref 3.5–5.1)
Potassium: 4.5 mmol/L (ref 3.5–5.1)
Potassium: 4.6 mmol/L (ref 3.5–5.1)
Sodium: 132 mmol/L — ABNORMAL LOW (ref 135–145)
Sodium: 140 mmol/L (ref 135–145)
Sodium: 141 mmol/L (ref 135–145)

## 2022-03-13 LAB — GLUCOSE, CAPILLARY
Glucose-Capillary: 169 mg/dL — ABNORMAL HIGH (ref 70–99)
Glucose-Capillary: 175 mg/dL — ABNORMAL HIGH (ref 70–99)
Glucose-Capillary: 185 mg/dL — ABNORMAL HIGH (ref 70–99)
Glucose-Capillary: 192 mg/dL — ABNORMAL HIGH (ref 70–99)
Glucose-Capillary: 195 mg/dL — ABNORMAL HIGH (ref 70–99)
Glucose-Capillary: 210 mg/dL — ABNORMAL HIGH (ref 70–99)
Glucose-Capillary: 241 mg/dL — ABNORMAL HIGH (ref 70–99)

## 2022-03-13 LAB — POC URINE PREG, ED: Preg Test, Ur: NEGATIVE

## 2022-03-13 LAB — URINALYSIS, ROUTINE W REFLEX MICROSCOPIC
Bilirubin Urine: NEGATIVE
Glucose, UA: >=500 mg/dL — AB
Ketones, ur: 80 mg/dL — AB
Nitrite: NEGATIVE
Protein, ur: 30 mg/dL — AB
Specific Gravity, Urine: 1.021 (ref 1.005–1.030)
WBC, UA: >50 WBC/hpf — ABNORMAL HIGH (ref 0–5)
pH: 5 (ref 5.0–8.0)

## 2022-03-13 LAB — CBG MONITORING, ED
Glucose-Capillary: >600 mg/dL (ref 70–99)
Glucose-Capillary: >600 mg/dL (ref 70–99)
Glucose-Capillary: 544 mg/dL (ref 70–99)
Glucose-Capillary: 550 mg/dL (ref 70–99)
Glucose-Capillary: 413 mg/dL — ABNORMAL HIGH (ref 70–99)
Glucose-Capillary: 478 mg/dL — ABNORMAL HIGH (ref 70–99)
Glucose-Capillary: 356 mg/dL — ABNORMAL HIGH (ref 70–99)
Glucose-Capillary: 260 mg/dL — ABNORMAL HIGH (ref 70–99)

## 2022-03-13 LAB — BLOOD GAS, VENOUS
Acid-base deficit: 23 mmol/L — ABNORMAL HIGH (ref 0.0–2.0)
Bicarbonate: 6.2 mmol/L — ABNORMAL LOW (ref 20.0–28.0)
O2 Saturation: 73.5 %
Patient temperature: 37
pCO2, Ven: 23 mmHg — ABNORMAL LOW (ref 44–60)
pH, Ven: 7.04 — CL (ref 7.25–7.43)
pO2, Ven: 52 mmHg — ABNORMAL HIGH (ref 32–45)

## 2022-03-13 LAB — URINALYSIS, COMPLETE (UACMP) WITH MICROSCOPIC
Bilirubin Urine: NEGATIVE
Glucose, UA: >=500 mg/dL — AB
Ketones, ur: 80 mg/dL — AB
Nitrite: NEGATIVE
Protein, ur: 30 mg/dL — AB
RBC / HPF: >50 RBC/hpf — ABNORMAL HIGH (ref 0–5)
Specific Gravity, Urine: 1.021 (ref 1.005–1.030)
WBC, UA: >50 WBC/hpf — ABNORMAL HIGH (ref 0–5)
pH: 5 (ref 5.0–8.0)

## 2022-03-13 LAB — COMPREHENSIVE METABOLIC PANEL
ALT: 16 U/L (ref 0–44)
AST: 16 U/L (ref 15–41)
Albumin: 4.5 g/dL (ref 3.5–5.0)
Alkaline Phosphatase: 153 U/L — ABNORMAL HIGH (ref 38–126)
Anion gap: 30 — ABNORMAL HIGH (ref 5–15)
BUN: 56 mg/dL — ABNORMAL HIGH (ref 6–20)
CO2: 8 mmol/L — ABNORMAL LOW (ref 22–32)
Calcium: 9.5 mg/dL (ref 8.9–10.3)
Chloride: 89 mmol/L — ABNORMAL LOW (ref 98–111)
Creatinine, Ser: 2.31 mg/dL — ABNORMAL HIGH (ref 0.44–1.00)
GFR, Estimated: 24 mL/min — ABNORMAL LOW (ref >60)
Glucose, Bld: 960 mg/dL (ref 70–99)
Potassium: 6.2 mmol/L — ABNORMAL HIGH (ref 3.5–5.1)
Sodium: 127 mmol/L — ABNORMAL LOW (ref 135–145)
Total Bilirubin: 2 mg/dL — ABNORMAL HIGH (ref 0.3–1.2)
Total Protein: 9.6 g/dL — ABNORMAL HIGH (ref 6.5–8.1)

## 2022-03-13 LAB — CBC WITH DIFFERENTIAL/PLATELET
Abs Immature Granulocytes: 0 10*3/uL (ref 0.00–0.07)
Basophils Absolute: 0 10*3/uL (ref 0.0–0.1)
Basophils Relative: 0 %
Eosinophils Absolute: 0.2 10*3/uL (ref 0.0–0.5)
Eosinophils Relative: 1 %
HCT: 45 % (ref 36.0–46.0)
Hemoglobin: 14.3 g/dL (ref 12.0–15.0)
Lymphocytes Relative: 4 %
Lymphs Abs: 0.8 10*3/uL (ref 0.7–4.0)
MCH: 29.4 pg (ref 26.0–34.0)
MCHC: 31.8 g/dL (ref 30.0–36.0)
MCV: 92.4 fL (ref 80.0–100.0)
Monocytes Absolute: 0.8 10*3/uL (ref 0.1–1.0)
Monocytes Relative: 4 %
Neutro Abs: 17.5 10*3/uL — ABNORMAL HIGH (ref 1.7–7.7)
Neutrophils Relative %: 91 %
Platelets: 406 10*3/uL — ABNORMAL HIGH (ref 150–400)
RBC: 4.87 MIL/uL (ref 3.87–5.11)
RDW: 12.4 % (ref 11.5–15.5)
Smear Review: NORMAL
WBC: 19.2 10*3/uL — ABNORMAL HIGH (ref 4.0–10.5)
nRBC: 0 % (ref 0.0–0.2)

## 2022-03-13 LAB — LACTIC ACID, PLASMA
Lactic Acid, Venous: 2.1 mmol/L (ref 0.5–1.9)
Lactic Acid, Venous: 1.4 mmol/L (ref 0.5–1.9)

## 2022-03-13 LAB — MRSA NEXT GEN BY PCR, NASAL: MRSA by PCR Next Gen: NOT DETECTED

## 2022-03-13 LAB — TROPONIN I (HIGH SENSITIVITY)
Troponin I (High Sensitivity): 10 ng/L (ref <18)
Troponin I (High Sensitivity): 16 ng/L (ref <18)

## 2022-03-13 LAB — RESP PANEL BY RT-PCR (FLU A&B, COVID) ARPGX2
Influenza A by PCR: NEGATIVE
Influenza B by PCR: NEGATIVE
SARS Coronavirus 2 by RT PCR: NEGATIVE

## 2022-03-13 LAB — PROCALCITONIN: Procalcitonin: 1 ng/mL

## 2022-03-13 LAB — APTT: aPTT: 28 seconds (ref 24–36)

## 2022-03-13 LAB — PROTIME-INR
INR: 1.3 — ABNORMAL HIGH (ref 0.8–1.2)
Prothrombin Time: 15.7 seconds — ABNORMAL HIGH (ref 11.4–15.2)

## 2022-03-13 LAB — BETA-HYDROXYBUTYRIC ACID
Beta-Hydroxybutyric Acid: >8 mmol/L — ABNORMAL HIGH (ref 0.05–0.27)
Beta-Hydroxybutyric Acid: 6.6 mmol/L — ABNORMAL HIGH (ref 0.05–0.27)

## 2022-03-13 MED ORDER — SODIUM BICARBONATE 8.4 % IV SOLN
50.0000 meq | Freq: Once | INTRAVENOUS | Status: AC
Start: 2022-03-13 — End: 2022-03-13
  Administered 2022-03-13: 50 meq via INTRAVENOUS
  Filled 2022-03-13: qty 50

## 2022-03-13 MED ORDER — VANCOMYCIN HCL 1250 MG/250ML IV SOLN
1250.0000 mg | Freq: Once | INTRAVENOUS | Status: AC
  Administered 2022-03-13: 1250 mg via INTRAVENOUS
  Filled 2022-03-13: qty 250

## 2022-03-13 MED ORDER — LACTATED RINGERS IV SOLN: INTRAVENOUS | Status: DC

## 2022-03-13 MED ORDER — ONDANSETRON HCL 4 MG/2ML IJ SOLN
4.0000 mg | Freq: Once | INTRAMUSCULAR | Status: AC
  Administered 2022-03-13: 4 mg via INTRAVENOUS
  Filled 2022-03-13: qty 2

## 2022-03-13 MED ORDER — VANCOMYCIN HCL IN DEXTROSE 1-5 GM/200ML-% IV SOLN: 1000.0000 mg | Freq: Once | INTRAVENOUS | Status: DC

## 2022-03-13 MED ORDER — DEXTROSE 50 % IV SOLN: 0.0000 mL | INTRAVENOUS | Status: DC | PRN

## 2022-03-13 MED ORDER — SODIUM CHLORIDE 0.9 % IV BOLUS
1000.0000 mL | Freq: Once | INTRAVENOUS | Status: AC
  Administered 2022-03-13: 1000 mL via INTRAVENOUS

## 2022-03-13 MED ORDER — DILTIAZEM HCL ER COATED BEADS 240 MG PO CP24
240.0000 mg | ORAL_CAPSULE | Freq: Every day | ORAL | Status: DC
  Administered 2022-03-14 – 2022-03-15 (×2): 240 mg via ORAL
  Filled 2022-03-13 (×2): qty 1

## 2022-03-13 MED ORDER — SODIUM ZIRCONIUM CYCLOSILICATE 10 G PO PACK
10.0000 g | PACK | Freq: Once | ORAL | Status: AC
Start: 2022-03-13 — End: 2022-03-13
  Administered 2022-03-13: 10 g via ORAL
  Filled 2022-03-13: qty 1

## 2022-03-13 MED ORDER — HYDROXYZINE PAMOATE 50 MG PO CAPS
50.0000 mg | ORAL_CAPSULE | Freq: Two times a day (BID) | ORAL | Status: DC | PRN
Start: 2022-03-13 — End: 2022-03-16

## 2022-03-13 MED ORDER — POLYETHYLENE GLYCOL 3350 17 G PO PACK: 17.0000 g | PACK | Freq: Every day | ORAL | Status: DC | PRN

## 2022-03-13 MED ORDER — DEXTROSE IN LACTATED RINGERS 5 % IV SOLN: INTRAVENOUS | Status: DC

## 2022-03-13 MED ORDER — LACTATED RINGERS IV BOLUS
1000.0000 mL | Freq: Once | INTRAVENOUS | Status: AC
  Administered 2022-03-13: 1000 mL via INTRAVENOUS

## 2022-03-13 MED ORDER — CHLORHEXIDINE GLUCONATE CLOTH 2 % EX PADS
6.0000 | MEDICATED_PAD | Freq: Every day | CUTANEOUS | Status: DC
  Administered 2022-03-15: 6 via TOPICAL

## 2022-03-13 MED ORDER — SODIUM CHLORIDE 0.9 % IV SOLN
2.0000 g | INTRAVENOUS | Status: DC
  Filled 2022-03-13: qty 12.5

## 2022-03-13 MED ORDER — ACETAMINOPHEN 325 MG PO TABS: 650.0000 mg | ORAL_TABLET | Freq: Four times a day (QID) | ORAL | Status: DC | PRN

## 2022-03-13 MED ORDER — ATORVASTATIN CALCIUM 20 MG PO TABS
40.0000 mg | ORAL_TABLET | Freq: Every day | ORAL | Status: DC
  Administered 2022-03-13 – 2022-03-15 (×3): 40 mg via ORAL
  Filled 2022-03-13 (×3): qty 2

## 2022-03-13 MED ORDER — METRONIDAZOLE 500 MG/100ML IV SOLN
500.0000 mg | Freq: Once | INTRAVENOUS | Status: AC
  Administered 2022-03-13: 500 mg via INTRAVENOUS
  Filled 2022-03-13: qty 100

## 2022-03-13 MED ORDER — NICOTINE 21 MG/24HR TD PT24
21.0000 mg | MEDICATED_PATCH | Freq: Every day | TRANSDERMAL | Status: DC
  Filled 2022-03-13 (×2): qty 1

## 2022-03-13 MED ORDER — PANTOPRAZOLE SODIUM 40 MG PO TBEC
40.0000 mg | DELAYED_RELEASE_TABLET | Freq: Every day | ORAL | Status: DC
  Administered 2022-03-14 – 2022-03-15 (×2): 40 mg via ORAL
  Filled 2022-03-13 (×2): qty 1

## 2022-03-13 MED ORDER — INSULIN REGULAR(HUMAN) IN NACL 100-0.9 UT/100ML-% IV SOLN
INTRAVENOUS | Status: DC
  Administered 2022-03-13: 8 [IU]/h via INTRAVENOUS
  Administered 2022-03-14: 3.4 [IU]/h via INTRAVENOUS
  Filled 2022-03-13 (×2): qty 100

## 2022-03-13 MED ORDER — CALCIUM GLUCONATE-NACL 1-0.675 GM/50ML-% IV SOLN
1.0000 g | Freq: Once | INTRAVENOUS | Status: AC
  Administered 2022-03-13: 1000 mg via INTRAVENOUS
  Filled 2022-03-13: qty 50

## 2022-03-13 MED ORDER — ONDANSETRON HCL 4 MG PO TABS: 4.0000 mg | ORAL_TABLET | Freq: Four times a day (QID) | ORAL | Status: DC | PRN

## 2022-03-13 MED ORDER — SODIUM CHLORIDE 0.9 % IV SOLN
2.0000 g | Freq: Once | INTRAVENOUS | Status: AC
  Administered 2022-03-13: 2 g via INTRAVENOUS
  Filled 2022-03-13: qty 12.5

## 2022-03-13 MED ORDER — BUPROPION HCL ER (SR) 150 MG PO TB12
150.0000 mg | ORAL_TABLET | Freq: Two times a day (BID) | ORAL | Status: DC
  Administered 2022-03-13 – 2022-03-15 (×4): 150 mg via ORAL
  Filled 2022-03-13 (×5): qty 1

## 2022-03-13 MED ORDER — LACTATED RINGERS IV BOLUS: 500.0000 mL | Freq: Once | INTRAVENOUS | Status: DC

## 2022-03-13 MED ORDER — ENOXAPARIN SODIUM 30 MG/0.3ML IJ SOSY
30.0000 mg | PREFILLED_SYRINGE | INTRAMUSCULAR | Status: DC
  Administered 2022-03-13: 30 mg via SUBCUTANEOUS
  Filled 2022-03-13: qty 0.3

## 2022-03-13 MED ORDER — GABAPENTIN 100 MG PO CAPS
100.0000 mg | ORAL_CAPSULE | Freq: Every day | ORAL | Status: DC
  Administered 2022-03-13 – 2022-03-15 (×3): 100 mg via ORAL
  Filled 2022-03-13 (×3): qty 1

## 2022-03-13 MED ORDER — ACETAMINOPHEN 650 MG RE SUPP: 650.0000 mg | Freq: Four times a day (QID) | RECTAL | Status: DC | PRN

## 2022-03-13 MED ORDER — ENOXAPARIN SODIUM 40 MG/0.4ML IJ SOSY
40.0000 mg | PREFILLED_SYRINGE | INTRAMUSCULAR | Status: DC
Start: 2022-03-13 — End: 2022-03-13

## 2022-03-13 MED ORDER — ONDANSETRON HCL 4 MG/2ML IJ SOLN: 4.0000 mg | Freq: Four times a day (QID) | INTRAMUSCULAR | Status: DC | PRN

## 2022-03-13 NOTE — Sepsis Progress Note (Signed)
Elink following code sepsis °

## 2022-03-13 NOTE — ED Notes (Signed)
Pt placed on bare hugger at this time.

## 2022-03-13 NOTE — Progress Notes (Signed)
Pt received from ED via stretcher, on insulin drip and LR at 125. Denies pain, awake and appropriate, denies pain, only complains of being cold. Bed wet with urine. Pericare provided and PureWick placed. ST on monitor. Bilateral IV's in place in [facility] area, CDI and infusing well with no problems. Pt declines to answer admission questions at this time; requests to be left alone, but also requests ice chips. Dr. Myriam Forehand was contacted and order placed for NPO with sips with meds and ice chips. IVF changed over to D5LR at 125. Remains on Endotool and hourly blood sugar checks.

## 2022-03-13 NOTE — ED Notes (Signed)
Critical result glucose 920. Fuller Plan, MD aware.

## 2022-03-13 NOTE — ED Provider Notes (Signed)
[facility] Provider Note    Event Date/Time   First MD Initiated Contact with Patient 03/13/22 [phone]     (approximate)   History   Weakness   HPI  Pamela Lane is a 61 y.o. female  significant of hypertension, hyperlipidemia, diabetes mellitus, asthma, GERD, depression, bipolar, C. difficile colitis, pancreatitis, cocaine abuse, DKA, tobacco abuse, SVT, UTI, CKD-3A, who comes in with weakness. Pt with N/V/fever and uti symptoms for a few days and generalized weakness. Pt denies any falls.   On review of records patient does have a history of DKA.  However we were told with EMS that sugars were in the 200s.      Physical Exam   Triage Vital Signs: ED Triage Vitals  Enc Vitals Group     BP 03/13/22 0746 (!) 150/94     Pulse Rate 03/13/22 0746 (!) 109     Resp 03/13/22 0746 18     Temp 03/13/22 0746 (!) 95.9 F (35.5 C)     Temp Source 03/13/22 0746 Rectal     SpO2 03/13/22 0746 100 %     Weight 03/13/22 0744 141 lb 3.2 oz (64 kg)     Height 03/13/22 0744 5\' 2"  (1.575 m)     Head Circumference --      Peak Flow --      Pain Score 03/13/22 0744 10     Pain Loc --      Pain Edu? --      Excl. in GC? --     Most recent vital signs: Vitals:   03/13/22 0746  BP: (!) 150/94  Pulse: (!) 109  Resp: 18  Temp: (!) 95.9 F (35.5 C)  SpO2: 100%     General: Awake, no distress.  Patient is alert and oriented x3 although she is soft-spoken. CV:  Good peripheral perfusion.  Resp:  Normal effort.  Abd:  No distention.  Possible pain. Other:  Moves all extremities well.    ED Results / Procedures / Treatments   Labs (all labs ordered are listed, but only abnormal results are displayed) Labs Reviewed  RESP PANEL BY RT-PCR (FLU A&B, COVID) [id number]  CULTURE, BLOOD (ROUTINE X 2)  CULTURE, BLOOD (ROUTINE X 2)  URINE CULTURE  LACTIC ACID, PLASMA  LACTIC ACID, PLASMA  COMPREHENSIVE METABOLIC PANEL  CBC WITH DIFFERENTIAL/PLATELET   PROTIME-INR  APTT  URINALYSIS, COMPLETE (UACMP) WITH MICROSCOPIC  PROCALCITONIN  BLOOD GAS, VENOUS  POC URINE PREG, ED  TROPONIN I (HIGH SENSITIVITY)     EKG  My interpretation of EKG:  Sinus tachy rate 109, no st elevation, no twi, normal intervals   RADIOLOGY I have reviewed the xray personally and interpreted no evidence of any pneumonia   PROCEDURES:  Critical Care performed: Yes, see critical care procedure note(s)  .Critical Care  Performed by: 05/13/22, MD Authorized by: Concha Se, MD   Critical care provider statement:    Critical care time (minutes):  30   Critical care was necessary to treat or prevent imminent or life-threatening deterioration of the following conditions:  Sepsis   Critical care was time spent personally by me on the following activities:  Development of treatment plan with patient or surrogate, discussions with consultants, evaluation of patient's response to treatment, examination of patient, ordering and review of laboratory studies, ordering and review of radiographic studies, ordering and performing treatments and interventions, pulse oximetry, re-evaluation of patient's condition and review of old charts .  1-3 Lead EKG Interpretation  Performed by: Concha Se, MD Authorized by: Concha Se, MD     Interpretation: abnormal     ECG rate:  100   ECG rate assessment: tachycardic     Rhythm: sinus tachycardia     Ectopy: none     Conduction: normal   Ultrasound ED Peripheral IV (Provider)  Date/Time: 03/13/2022 8:23 AM  Performed by: Concha Se, MD Authorized by: Concha Se, MD   Procedure details:    Indications: multiple failed IV attempts     Skin Prep: chlorhexidine gluconate     Location:  Right AC   Angiocath:  20 G   Bedside Ultrasound Guided: Yes     Patient tolerated procedure without complications: Yes     Dressing applied: Yes   Ultrasound ED Peripheral IV (Provider)  Date/Time: 03/13/2022 8:23  AM  Performed by: Concha Se, MD Authorized by: Concha Se, MD   Procedure details:    Indications: hydration     Skin Prep: chlorhexidine gluconate     Location:  Left AC   Angiocath:  20 G   Bedside Ultrasound Guided: Yes     Images: not archived     Patient tolerated procedure without complications: Yes     Dressing applied: Yes      MEDICATIONS ORDERED IN ED: Medications  ceFEPIme (MAXIPIME) 2 g in sodium chloride 0.9 % 100 mL IVPB (has no administration in time range)  metroNIDAZOLE (FLAGYL) IVPB 500 mg (has no administration in time range)  sodium chloride 0.9 % bolus 1,000 mL (has no administration in time range)  vancomycin (VANCOREADY) IVPB 1250 mg/250 mL (has no administration in time range)     IMPRESSION / MDM / ASSESSMENT AND PLAN / ED COURSE  I reviewed the triage vital signs and the nursing notes.   Patient's presentation is most consistent with acute presentation with potential threat to life or bodily function.   Differential: sepsis UTI COVID PNA, DKA.  Broad-spectrum antibiotics were started due to meet sepsis criteria of elevated heart rate and low temperature.  Sepsis alert was called and patient was given a total of 1.5 L of fluid.  Lactate was slightly elevated.  CMP shows elevated potassium.  We will give a dose of calcium, bicarb and start on insulin drip for the DKA.  CBC with elevated white count but have already covered her with antibiotics.  CT abdomen shows possible viral pneumonia but COVID test was negative.  Patient urine does look consistent with UTI.  Patient will be admitted to the hospitalist for sepsis, UTI, DKA  The patient is on the cardiac monitor to evaluate for evidence of arrhythmia and/or significant heart rate changes.      FINAL CLINICAL IMPRESSION(S) / ED DIAGNOSES   Final diagnoses:  Diabetic ketoacidosis without coma associated with other specified diabetes mellitus (HCC)  AKI (acute kidney injury) (HCC)  Sepsis, due  to unspecified organism, unspecified whether acute organ dysfunction present [facility])  Acute cystitis without hematuria     Rx / DC Orders   ED Discharge Orders     None        Note:  This document was prepared using Dragon voice recognition software and may include unintentional dictation errors.   Concha Se, MD 03/13/22 1001

## 2022-03-13 NOTE — ED Notes (Signed)
Pure wick placed.

## 2022-03-13 NOTE — H&P (Signed)
History and Physical:    Pamela Lane   TOI:[id number] DOB: [birth year] DOA: 03/13/2022  Referring MD/provider: Artis Delay, MD PCP: [facility]   Patient coming from: Home  Chief Complaint: Generalized weakness  History of Present Illness:   Pamela CASTRONOVO is a 61 y.o. female with medical history significant for insulin-dependent diabetes mellitus with history of DKA, asthma, hypertension, depression, bipolar disorder, history of alcohol use disorder, history of acute pancreatitis, C. difficile colitis, tobacco use disorder, cocaine use disorder, SVT, UTI, CKD stage IIIa.  She presented to the hospital with generalized weakness, nausea, vomiting and lower abdominal pain.  She also complained of foul-smelling urine and increased frequency of micturition but does not report dysuria or hematuria.  She said she has been taking her medications including insulin as prescribed.  She was found to have DKA, severe sepsis secondary to acute UTI complicated by AKI and hyperkalemia.  ED Course:  The patient was given IV insulin, IV antibiotics (vancomycin, Flagyl and cefepime), IV calcium gluconate.    ROS:   ROS all other systems reviewed were negative.  Past Medical History:   Past Medical History:  Diagnosis Date   Asthma    Clostridium difficile colitis    Diabetes mellitus without complication (HCC)    Hypertension    Mental disorder    pt reports 'I have all of them mental disorders'    Past Surgical History:   Past Surgical History:  Procedure Laterality Date   ABDOMINAL HYSTERECTOMY      Social History:   Social History   Socioeconomic History   Marital status: Single    Spouse name: Not on file   Number of children: Not on file   Years of education: Not on file   Highest education level: Not on file  Occupational History   Not on file  Tobacco Use   Smoking status: Every Day    Packs/day: 0.25    Types: Cigarettes   Smokeless  tobacco: Never  Vaping Use   Vaping Use: Never used  Substance and Sexual Activity   Alcohol use: Yes    Alcohol/week: 40.0 standard drinks of alcohol    Types: 40 Cans of beer per week    Comment: 3 big bottles of liquor   Drug use: Yes    Types: Marijuana, Cocaine   Sexual activity: Not on file  Other Topics Concern   Not on file  Social History Narrative   Not on file   Social Determinants of Health   Financial Resource Strain: Not on file  Food Insecurity: Not on file  Transportation Needs: Not on file  Physical Activity: Not on file  Stress: Not on file  Social Connections: Not on file  Intimate Partner Violence: Not on file    Allergies   Patient has no known allergies.  Family history:   Family History  Problem Relation Age of Onset   Hypertension Mother     Current Medications:   Prior to Admission medications   Medication Sig Start Date End Date Taking? Authorizing Provider  ALLERGY RELIEF/INDOOR/OUTDOOR 10 MG tablet Take 10 mg by mouth daily. 12/17/21   [provider]  atorvastatin (LIPITOR) 40 MG tablet TAKE 1 TABLET BY MOUTH AT BEDTIME. Patient taking differently: Take 40 mg by mouth at bedtime. 08/29/20 03/13/22  Darlin Drop, DO  buPROPion (WELLBUTRIN SR) 150 MG 12 hr tablet Take 150 mg by mouth 2 (two) times daily. 10/01/20   [provider]  DILT-XR 120 MG 24 hr capsule Take 120 mg by mouth daily. 12/17/21   [provider]  diltiazem (CARDIZEM CD) 240 MG 24 hr capsule Take 1 capsule (240 mg total) by mouth daily. 10/12/20   Loletha Grayer, MD  folic acid (FOLVITE) 1 MG tablet Take 1 mg by mouth daily. 12/17/21   [provider]  gabapentin (NEURONTIN) 100 MG capsule TAKE 1 CAPSULE BY MOUTH 2 TIMES DAILY. Patient taking differently: Take 100 mg by mouth 2 (two) times daily. 08/29/20 08/29/21  Kayleen Memos, DO  gabapentin (NEURONTIN) 300 MG capsule Take 300 mg by mouth 2 (two) times daily. 12/17/21   [provider]   hydrOXYzine (VISTARIL) 50 MG capsule Take 50 mg by mouth 2 (two) times daily as needed. 12/17/21   [provider]  insulin glargine (LANTUS) 100 UNIT/ML Solostar Pen Inject 36 Units into the skin daily. 11/18/20 02/01/21  Sreenath, Trula Slade, MD  JANUVIA 100 MG tablet Take 100 mg by mouth daily. 12/17/21   [provider]  lisinopril (ZESTRIL) 10 MG tablet Take 10 mg by mouth daily. 10/01/20   [provider]  metFORMIN (GLUCOPHAGE-XR) 500 MG 24 hr tablet TAKE 2 TABLETS (1,000 MG TOTAL) BY MOUTH DAILY WITH BREAKFAST. Patient taking differently: Take 1,000 mg by mouth daily with breakfast. 08/29/20 08/29/21  Kayleen Memos, DO  Multiple Vitamin (MULTIVITAMIN WITH MINERALS) TABS tablet Take 1 tablet by mouth daily. 03/08/18   Nicholes Mango, MD  Multiple Vitamins-Minerals (DAILY MULTIVITAMIN) CAPS Take 1 tablet by mouth daily. 12/17/21   [provider]  nicotine (NICODERM CQ - DOSED IN MG/24 HOURS) 21 mg/24hr patch Place 1 patch (21 mg total) onto the skin daily. 11/18/20   Sreenath, Sudheer B, MD  NOVOLOG FLEXPEN 100 UNIT/ML FlexPen INJECT 10 UNITS INTO THE SKIN 3 TIMES DAILY WITH MEALS. Patient taking differently: Inject 10 Units into the skin 3 (three) times daily with meals. 08/29/20 08/29/21  Kayleen Memos, DO  omeprazole (PRILOSEC) 20 MG capsule Take 20 mg by mouth daily.    [provider]  vitamin B-12 (CYANOCOBALAMIN) 500 MCG tablet Take 500 mcg by mouth daily.    [provider]  XIGDUO XR 04-999 MG TB24 Take 1 tablet by mouth daily. 12/17/21   [provider]    Physical Exam:   Vitals:   03/13/22 1224 03/13/22 1300 03/13/22 1330 03/13/22 1405  BP: (!) 147/77 (!) 143/110 (!) 141/93 136/89  Pulse: (!) 115 (!) 114 (!) 112 (!) 116  Resp: (!) 21 17 19 20   Temp:      TempSrc:      SpO2: 96% 94% 98% 98%  Weight:      Height:         Physical Exam: Blood pressure 136/89, pulse (!) 116, temperature (!) 97.4 F (36.3 C), temperature  source Axillary, resp. rate 20, height 5\' 2"  (1.575 m), weight 64 kg, SpO2 98 %. Gen: No acute distress. Head: Normocephalic, atraumatic. Eyes: Pupils equal, round and reactive to light. Extraocular movements intact.  Sclerae nonicteric.  Mouth: Dry mucous membranes.  Poor dentition with missing teeth Neck: Supple, no thyromegaly, no lymphadenopathy, no jugular venous distention. Chest: Lungs are clear to auscultation with good air movement. No rales, rhonchi or wheezes.  CV: Heart sounds are regular with an S1, S2. No murmurs, rubs or gallops.  Abdomen: Soft, nontender, nondistended with normal active bowel sounds. No palpable masses. Extremities: Extremities are without clubbing, or cyanosis. No edema.  Pedal pulses 2+.  Skin: Warm and dry. No rash noticeable skin Neuro: Alert and oriented times 3; grossly nonfocal.  Psych: Insight is good and judgment is appropriate. Mood and affect normal.   Data Review:    Labs: Basic Metabolic Panel: Recent Labs  Lab 03/13/22 0829 03/13/22 1038  NA 127* 132*  K 6.2* 6.5*  CL 89* 100  CO2 8* 9*  GLUCOSE 960* 779*  BUN 56* 55*  CREATININE 2.31* 2.03*  CALCIUM 9.5 8.7*   Liver Function Tests: Recent Labs  Lab 03/13/22 0829  AST 16  ALT 16  ALKPHOS 153*  BILITOT 2.0*  PROT 9.6*  ALBUMIN 4.5   No results for input(s): "LIPASE", "AMYLASE" in the last 168 hours. No results for input(s): "AMMONIA" in the last 168 hours. CBC: Recent Labs  Lab 03/13/22 0829  WBC 19.2*  NEUTROABS 17.5*  HGB 14.3  HCT 45.0  MCV 92.4  PLT 406*   Cardiac Enzymes: No results for input(s): "CKTOTAL", "CKMB", "CKMBINDEX", "TROPONINI" in the last 168 hours.  BNP (last 3 results) No results for input(s): "PROBNP" in the last 8760 hours. CBG: Recent Labs  Lab 03/13/22 1048 03/13/22 1129 03/13/22 1215 03/13/22 1252 03/13/22 1332  GLUCAP >600* >600* 544* 550* 478*    Urinalysis    Component Value Date/Time   COLORURINE YELLOW (A) 03/13/2022  0917   APPEARANCEUR CLOUDY (A) 03/13/2022 0917   LABSPEC 1.021 03/13/2022 0917   PHURINE 5.0 03/13/2022 0917   GLUCOSEU >=500 (A) 03/13/2022 0917   HGBUR LARGE (A) 03/13/2022 0917   BILIRUBINUR NEGATIVE 03/13/2022 0917   KETONESUR 80 (A) 03/13/2022 0917   PROTEINUR 30 (A) 03/13/2022 0917   NITRITE NEGATIVE 03/13/2022 0917   LEUKOCYTESUR LARGE (A) 03/13/2022 0917      Radiographic Studies: CT HEAD WO CONTRAST (5MM)  Result Date: 03/13/2022 CLINICAL DATA:  Mental status change. EXAM: CT HEAD WITHOUT CONTRAST TECHNIQUE: Contiguous axial images were obtained from the base of the skull through the vertex without intravenous contrast. RADIATION DOSE REDUCTION: This exam was performed according to the departmental dose-optimization program which includes automated exposure control, adjustment of the mA and/or kV according to patient size and/or use of iterative reconstruction technique. COMPARISON:  01/11/2021 FINDINGS: Brain: There is no evidence for acute hemorrhage, hydrocephalus, or abnormal extra-axial fluid collection. No definite CT evidence for acute infarction. 12 mm calcified extra-axial lesion in the left temporal region is stable, likely small meningioma. No associated mass effect or edema. Vascular: No hyperdense vessel or unexpected calcification. Skull: No evidence for fracture. No worrisome lytic or sclerotic lesion. Sinuses/Orbits: The visualized paranasal sinuses and mastoid air cells are clear. Visualized portions of the globes and intraorbital fat are unremarkable. Other: None. IMPRESSION: 1. Stable.  No acute intracranial abnormality. 2. Stable 12 mm calcified extra-axial lesion in the left temporal region, likely small meningioma. Electronically Signed   By: Misty Stanley M.D.   On: 03/13/2022 09:23   CT ABDOMEN PELVIS WO CONTRAST  Result Date: 03/13/2022 CLINICAL DATA:  Abdominal pain EXAM: CT ABDOMEN AND PELVIS WITHOUT CONTRAST TECHNIQUE: Multidetector CT imaging of the abdomen  and pelvis was performed following the standard protocol without IV contrast. RADIATION DOSE REDUCTION: This exam was performed according to the departmental dose-optimization program which includes automated exposure control, adjustment of the mA and/or kV according to patient size and/or use of iterative reconstruction technique. COMPARISON:  01/11/2021 FINDINGS: Lower chest: Multifocal, bilateral patchy airspace and ground-glass densities are noted within the imaged portions of the lung  bases. No pleural effusion. Hepatobiliary: No focal liver abnormality is seen. No gallstones, gallbladder wall thickening, or biliary dilatation. Pancreas: Unremarkable. No pancreatic ductal dilatation or surrounding inflammatory changes. Spleen: Normal in size without focal abnormality. Adrenals/Urinary Tract: Normal adrenal glands. No nephrolithiasis or hydronephrosis. No suspicious kidney mass. Urinary bladder is unremarkable. Stomach/Bowel: Stomach appears normal. The appendix is visualized and is within normal limits. No bowel wall thickening, inflammation, or distension. Vascular/Lymphatic: Aortic atherosclerosis. No aneurysm. No signs of abdominopelvic adenopathy. Reproductive: Status post hysterectomy. No adnexal masses. Other: No free fluid or fluid collections. No signs of pneumoperitoneum. Musculoskeletal: No acute or significant osseous findings. IMPRESSION: 1. No acute findings within the abdomen or pelvis. 2. Multifocal, bilateral patchy airspace and ground-glass densities are noted within the imaged portions of the lung bases. Imaging features can be seen with viral pneumonia, though are nonspecific and can occur with a variety of infectious and noninfectious processes. 3. Aortic Atherosclerosis (ICD10-I70.0). Electronically Signed   By: Signa Kell M.D.   On: 03/13/2022 09:22   DG Chest Port 1 View  Result Date: 03/13/2022 CLINICAL DATA:  61 year old female with possible sepsis. EXAM: PORTABLE CHEST 1 VIEW  COMPARISON:  Portable chest 01/11/2021 and earlier. FINDINGS: Portable AP semi upright view at 0827 hours. Low normal lung volumes. Normal cardiac size and mediastinal contours. Visualized tracheal air column is within normal limits. Allowing for portable technique the lungs are clear. No pneumothorax or pleural effusion. No osseous abnormality identified. Paucity of bowel gas in the upper abdomen. IMPRESSION: Negative portable chest. Electronically Signed   By: Odessa Fleming M.D.   On: 03/13/2022 08:45    EKG: Independently reviewed by me shows sinus tachycardia, prolonged QTc interval.    Assessment/Plan:   Principal Problem:   DKA (diabetic ketoacidosis) (HCC) Active Problems:   Severe sepsis (HCC)   Acute UTI   Hyponatremia   Acute renal failure superimposed on stage 3a chronic kidney disease (HCC)   Hyperkalemia   Body mass index is 25.83 kg/m.  DKA: Admit to ICU.  Treat with IV fluids and IV insulin drip per DKA protocol  Severe sepsis secondary to acute UTI: Treat with empiric IV antibiotics (ceftriaxone)  Bibasilar opacities noted on CT abdomen pelvis: Etiology unclear.  Treat empirically with IV antibiotics.  Consider repeat chest x-ray tomorrow.  AKI on CKD stage IIIa complicated by hyperkalemia: She has already received IV calcium gluconate.  Lokelma has been ordered for hyperkalemia.  Hopefully, IV insulin and IV fluids will help with hyperkalemia as well.  Monitor BMP closely  Hyponatremia: This is likely from hyperglycemia.  Continue IV fluids.  Alcohol, tobacco and cocaine use disorder: Counseled to quit    CRITICAL CARE Performed by: Lurene Shadow   Total critical care time: 50 minutes  Critical care time was exclusive of separately billable procedures and treating other patients.  Critical care was necessary to treat or prevent imminent or life-threatening deterioration.  Critical care was time spent personally by me on the following activities: development of  treatment plan with patient and/or surrogate as well as nursing, discussions with consultants, evaluation of patient's response to treatment, examination of patient, obtaining history from patient or surrogate, ordering and performing treatments and interventions, ordering and review of laboratory studies, ordering and review of radiographic studies, pulse oximetry and re-evaluation of patient's condition.    Other information:   DVT prophylaxis: Lovenox  Code Status: Full code. Family Communication: None  Disposition Plan: Plan to discharge home in 2 to 3 days  Consults called: None Admission status: Inpatient  The medical decision making on this patient was of high complexity and the patient is at high risk for clinical deterioration, therefore this is a level 3 visit.      Roselyne Stalnaker [facility] Pager: Please check [email or web address]   How to contact the [facility] Attending or Consulting provider [place] or covering provider during after hours [place], for this patient?   Check the care team in [facility] and look for a) attending/consulting [facility] provider listed and b) the [facility] team listed Log into [email or web address] and use [place]'s universal password to access. If you do not have the password, please contact the hospital operator. Locate the [facility] provider you are looking for under [facility] and page to a number that you can be directly reached. If you still have difficulty reaching the provider, please page the [facility] (Director on Call) for the Hospitalists listed on amion for assistance.  03/13/2022, 2:07 PM

## 2022-03-13 NOTE — Consult Note (Signed)
Pharmacy Antibiotic Note  Pamela Lane is a 61 y.o. female admitted on 03/13/2022 with sepsis.  Pharmacy has been consulted for cefepime dosing.  Plan: Cefepime IVPB q 24 hrs  Height: 5\' 2"  (157.5 cm) Weight: 64 kg (141 lb 3.2 oz) IBW/kg (Calculated) : 50.1  Temp (24hrs), Avg:96.4 F (35.8 C), Min:94.7 F (34.8 C), Max:97.4 F (36.3 C)  Recent Labs  Lab 03/13/22 0829 03/13/22 1038 03/13/22 1407  WBC 19.2*  --   --   CREATININE 2.31* 2.03* 1.88*  LATICACIDVEN 2.1* 1.4  --     Estimated Creatinine Clearance: 28 mL/min (A) (by C-G formula based on SCr of 1.88 mg/dL (H)).    No Known Allergies  Antimicrobials this admission: Metronidazole x 1 dose on 9/2 Vancomycin x 1 dose on 9/2 Cefepime  03/13/22 >>   Dose adjustments this admission: cefepime  Microbiology results: 9/2 BCx: pending collection - 1st Abx dose given prior to cultures 9/2 MRSA PCR: ordered  Thank you for allowing pharmacy to be a part of this patient's care.  Zelda Reames Rodriguez-Guzman PharmD, BCPS 03/13/2022 2:59 PM

## 2022-03-13 NOTE — Progress Notes (Signed)
CODE SEPSIS - PHARMACY COMMUNICATION  **Broad Spectrum Antibiotics should be administered within 1 hour of Sepsis diagnosis**  Time Code Sepsis Called/Page Received: 0812  Antibiotics Ordered: vancomycin, cefepime  Time of 1st antibiotic administration: 0856  Additional action taken by pharmacy: none required  If necessary, Name of Provider/Nurse Contacted: N/A    [name] ,PharmD Clinical Pharmacist  03/13/2022  8:08 AM

## 2022-03-13 NOTE — ED Notes (Signed)
Patient to CT at this time

## 2022-03-13 NOTE — Progress Notes (Signed)
PHARMACIST - PHYSICIAN COMMUNICATION  CONCERNING:  Enoxaparin (Lovenox) for DVT Prophylaxis    RECOMMENDATION: Patient was prescribed enoxaprin 40mg  q24 hours for VTE prophylaxis.   Filed Weights   03/13/22 0744  Weight: 64 kg (141 lb 3.2 oz)    Body mass index is 25.83 kg/m.  Estimated Creatinine Clearance: 25.9 mL/min (A) (by C-G formula based on SCr of 2.03 mg/dL (H)).   Patient is candidate for enoxaparin 30mg  every 24 hours based on CrCl <89ml/min or Weight <45kg  DESCRIPTION: Pharmacy has adjusted enoxaparin dose per [facility] policy.  Patient is now receiving enoxaparin 30 mg every 24 hours    Sweet Jarvis Rodriguez-Guzman PharmD, BCPS 03/13/2022 2:25 PM

## 2022-03-13 NOTE — Progress Notes (Signed)
PHARMACY -  BRIEF ANTIBIOTIC NOTE   Pharmacy has received consult(s) for vancomycin and cefepime from an ED provider.  The patient's profile has been reviewed for ht/wt/allergies/indication/available labs.    One time order(s) placed for   1) cefepime 2 grams IV x 1  2) vancomycin 1250 mg IV x 1  Further antibiotics/pharmacy consults should be ordered by admitting physician if indicated.                       Thank you, [name] 03/13/2022  8:04 AM

## 2022-03-13 NOTE — ED Notes (Signed)
Attempted for IV at this time with no success. Fuller Plan, MD aware.

## 2022-03-13 NOTE — ED Notes (Signed)
Critical Results: potassium 6.5, glucose 779  Adela Glimpse, MD aware

## 2022-03-13 NOTE — ED Triage Notes (Signed)
Patient to ED via [facility] from home for generalized weakness and not feeling well over the past few days. Patient had N/V, fever and UTI symptoms.

## 2022-03-13 NOTE — ED Notes (Signed)
Critical result. Lactic acid 2.1. Fuller Plan, MD aware.

## 2022-03-14 ENCOUNTER — Inpatient Hospital Stay: Payer: Medicaid Other

## 2022-03-14 DIAGNOSIS — R6520 Severe sepsis without septic shock: Secondary | ICD-10-CM

## 2022-03-14 DIAGNOSIS — A419 Sepsis, unspecified organism: Principal | ICD-10-CM

## 2022-03-14 DIAGNOSIS — E875 Hyperkalemia: Secondary | ICD-10-CM

## 2022-03-14 DIAGNOSIS — N179 Acute kidney failure, unspecified: Secondary | ICD-10-CM

## 2022-03-14 DIAGNOSIS — N1831 Chronic kidney disease, stage 3a: Secondary | ICD-10-CM

## 2022-03-14 DIAGNOSIS — E871 Hypo-osmolality and hyponatremia: Secondary | ICD-10-CM

## 2022-03-14 DIAGNOSIS — N390 Urinary tract infection, site not specified: Secondary | ICD-10-CM

## 2022-03-14 DIAGNOSIS — E1110 Type 2 diabetes mellitus with ketoacidosis without coma: Secondary | ICD-10-CM

## 2022-03-14 LAB — BASIC METABOLIC PANEL
Anion gap: 7 (ref 5–15)
BUN: 39 mg/dL — ABNORMAL HIGH (ref 6–20)
CO2: 23 mmol/L (ref 22–32)
Calcium: 9.4 mg/dL (ref 8.9–10.3)
Chloride: 116 mmol/L — ABNORMAL HIGH (ref 98–111)
Creatinine, Ser: 1.17 mg/dL — ABNORMAL HIGH (ref 0.44–1.00)
GFR, Estimated: 53 mL/min — ABNORMAL LOW (ref >60)
Glucose, Bld: 174 mg/dL — ABNORMAL HIGH (ref 70–99)
Potassium: 3.7 mmol/L (ref 3.5–5.1)
Sodium: 146 mmol/L — ABNORMAL HIGH (ref 135–145)

## 2022-03-14 LAB — URINE CULTURE: Culture: 30000 — AB

## 2022-03-14 LAB — GLUCOSE, CAPILLARY
Glucose-Capillary: 143 mg/dL — ABNORMAL HIGH (ref 70–99)
Glucose-Capillary: 162 mg/dL — ABNORMAL HIGH (ref 70–99)
Glucose-Capillary: 172 mg/dL — ABNORMAL HIGH (ref 70–99)
Glucose-Capillary: 173 mg/dL — ABNORMAL HIGH (ref 70–99)
Glucose-Capillary: 179 mg/dL — ABNORMAL HIGH (ref 70–99)
Glucose-Capillary: 183 mg/dL — ABNORMAL HIGH (ref 70–99)
Glucose-Capillary: 291 mg/dL — ABNORMAL HIGH (ref 70–99)
Glucose-Capillary: 310 mg/dL — ABNORMAL HIGH (ref 70–99)

## 2022-03-14 LAB — CBC
HCT: 36.1 % (ref 36.0–46.0)
Hemoglobin: 12.8 g/dL (ref 12.0–15.0)
MCH: 29.1 pg (ref 26.0–34.0)
MCHC: 35.5 g/dL (ref 30.0–36.0)
MCV: 82 fL (ref 80.0–100.0)
Platelets: 313 10*3/uL (ref 150–400)
RBC: 4.4 MIL/uL (ref 3.87–5.11)
RDW: 11.8 % (ref 11.5–15.5)
WBC: 10.3 10*3/uL (ref 4.0–10.5)
nRBC: 0 % (ref 0.0–0.2)

## 2022-03-14 LAB — HEMOGLOBIN A1C
Hgb A1c MFr Bld: 13 % — ABNORMAL HIGH (ref 4.8–5.6)
Mean Plasma Glucose: 326.4 mg/dL

## 2022-03-14 LAB — HIV ANTIBODY (ROUTINE TESTING W REFLEX): HIV Screen 4th Generation wRfx: NONREACTIVE

## 2022-03-14 LAB — BETA-HYDROXYBUTYRIC ACID: Beta-Hydroxybutyric Acid: 0.83 mmol/L — ABNORMAL HIGH (ref 0.05–0.27)

## 2022-03-14 LAB — PROCALCITONIN: Procalcitonin: 0.79 ng/mL

## 2022-03-14 MED ORDER — INSULIN ASPART 100 UNIT/ML IJ SOLN: 0.0000 [IU] | Freq: Every day | INTRAMUSCULAR | Status: DC

## 2022-03-14 MED ORDER — SODIUM CHLORIDE 0.9 % IV SOLN
2.0000 g | Freq: Two times a day (BID) | INTRAVENOUS | Status: DC
  Administered 2022-03-14 (×2): 2 g via INTRAVENOUS
  Filled 2022-03-14: qty 2
  Filled 2022-03-14 (×3): qty 12.5

## 2022-03-14 MED ORDER — ENOXAPARIN SODIUM 40 MG/0.4ML IJ SOSY
40.0000 mg | PREFILLED_SYRINGE | INTRAMUSCULAR | Status: DC
Start: 2022-03-14 — End: 2022-03-16
  Administered 2022-03-14 – 2022-03-15 (×2): 40 mg via SUBCUTANEOUS
  Filled 2022-03-14 (×2): qty 0.4

## 2022-03-14 MED ORDER — INSULIN GLARGINE-YFGN 100 UNIT/ML ~~LOC~~ SOLN
35.0000 [IU] | Freq: Every day | SUBCUTANEOUS | Status: DC
  Administered 2022-03-14: 35 [IU] via SUBCUTANEOUS
  Filled 2022-03-14 (×2): qty 0.35

## 2022-03-14 MED ORDER — INSULIN ASPART 100 UNIT/ML IJ SOLN
0.0000 [IU] | Freq: Three times a day (TID) | INTRAMUSCULAR | Status: DC
  Administered 2022-03-14: 8 [IU] via SUBCUTANEOUS
  Administered 2022-03-14: 11 [IU] via SUBCUTANEOUS
  Administered 2022-03-15 (×3): 5 [IU] via SUBCUTANEOUS
  Filled 2022-03-14 (×5): qty 1

## 2022-03-14 MED ORDER — HYDRALAZINE HCL 20 MG/ML IJ SOLN: 10.0000 mg | Freq: Four times a day (QID) | INTRAMUSCULAR | Status: DC | PRN

## 2022-03-14 NOTE — Progress Notes (Signed)
Patient transported to 1A via bed accompanied by patient transport

## 2022-03-14 NOTE — Consult Note (Signed)
Pharmacy Antibiotic Note  Pamela Lane is a 61 y.o. female w/ PMH of  DKA, asthma, hypertension, depression, bipolar disorder, history of alcohol use disorder, history of acute pancreatitis, C. difficile colitis, tobacco use disorder, cocaine use disorder, SVT, UTI, CKD stage IIIa admitted on 03/13/2022 with sepsis d/t UTI and DKA.  Pharmacy has been consulted for cefepime dosing. Renal function has improved since admission  Plan: adjust cefepime to 2 grams IV every 12 hours  Height: 5\' 2"  (157.5 cm) Weight: 68 kg (149 lb 14.6 oz) IBW/kg (Calculated) : 50.1  Temp (24hrs), Avg:97.2 F (36.2 C), Min:94.7 F (34.8 C), Max:98.2 F (36.8 C)  Recent Labs  Lab 03/13/22 0829 03/13/22 1038 03/13/22 1407 03/13/22 1640 03/14/22 0135  WBC 19.2*  --   --   --  10.3  CREATININE 2.31* 2.03* 1.88* 1.63* 1.17*  LATICACIDVEN 2.1* 1.4  --   --   --      Estimated Creatinine Clearance: 46.3 mL/min (A) (by C-G formula based on SCr of 1.17 mg/dL (H)).    No Known Allergies  Antimicrobials this admission: metronidazole x 1 dose on 9/2 vancomycin x 1 dose on 9/2 cefepime  03/13/22 >>   Microbiology results: 9/2 BCx: NGTD - 1st Abx dose given prior to cultures 9/2 MRSA PCR: negative  Thank you for allowing pharmacy to be a part of this patient's care.  11/2 PharmD, BCPS 03/14/2022 6:41 AM

## 2022-03-14 NOTE — Progress Notes (Signed)
Attempted to call report to 1A; nurse unable to take report, and states will call back for report

## 2022-03-14 NOTE — Progress Notes (Signed)
Pamela Lane    Pamela Lane  HQI:[id number] DOB: [birth year]  DOA: 03/13/2022 PCP: [facility]      Brief Narrative:    Medical records reviewed and are as summarized below:  Pamela Lane is a 61 y.o. female with medical history significant for insulin-dependent diabetes mellitus with history of DKA, asthma, hypertension, depression, bipolar disorder, history of alcohol use disorder, history of acute pancreatitis, C. difficile colitis, tobacco use disorder, cocaine use disorder, SVT, UTI, CKD stage IIIa.  She presented to the hospital with generalized weakness, nausea, vomiting and lower abdominal pain.  She also complained of foul-smelling urine and increased frequency of micturition but does not report dysuria or hematuria.  She said she has been taking her medications including insulin as prescribed.   She was found to have DKA, severe sepsis secondary to acute UTI complicated by AKI and hyperkalemia.        Assessment/Plan:   Principal Problem:   DKA (diabetic ketoacidosis) (HCC) Active Problems:   Severe sepsis (HCC)   Acute UTI   Hyponatremia   Acute renal failure superimposed on stage 3a chronic kidney disease (HCC)   Hyperkalemia   Body mass index is 27.42 kg/m.   Type II DM with DKA: DKA has resolved.  Discontinue IV insulin drip and IV fluids.  She was started on insulin glargine 35 units daily today.  His NovoLog sliding scale as needed for hyperglycemia.  Check hemoglobin A1c.  Last hemoglobin A1c was 15 about a year ago.   Severe sepsis secondary to acute UTI: Continue empiric IV ceftriaxone.  Follow-up urine and blood cultures.   Bibasilar opacities noted on CT abdomen pelvis: Etiology unclear. Treat empirically with IV antibiotics.  Repeat chest x-ray.  AKI on CKD stage IIIa complicated by hyperkalemia: AKI and hyperkalemia have improved.  Continue to hold lisinopril.  Monitor BMP.   Hyponatremia: Resolved.  Mild  hypernatremia: Encourage adequate oral intake.  Monitor BMP.  Hypertension: Continue Cardizem   Depression, bipolar disorder: Continue psychotropics  Alcohol, tobacco and cocaine use disorder: Counseled to quit   Transfer from ICU to MedSurg status   Diet Order             Diet NPO time specified Except for: Sips with Meds, Ice Chips  Diet effective now                            Consultants: None  Procedures: None    Medications:    atorvastatin  40 mg Oral QHS   buPROPion  150 mg Oral BID   Chlorhexidine Gluconate Cloth  6 each Topical Q0600   diltiazem  240 mg Oral Daily   enoxaparin (LOVENOX) injection  40 mg Subcutaneous Q24H   gabapentin  100 mg Oral QHS   insulin glargine-yfgn  35 Units Subcutaneous Daily   nicotine  21 mg Transdermal Daily   pantoprazole  40 mg Oral Daily   Continuous Infusions:  ceFEPime (MAXIPIME) IV     dextrose 5% lactated ringers 125 mL/hr at 03/14/22 0833   insulin 3 Units/hr (03/14/22 0734)   lactated ringers Stopped (03/13/22 1650)     Anti-infectives (From admission, onward)    Start     Dose/Rate Route Frequency Ordered Stop   03/14/22 1000  ceFEPIme (MAXIPIME) 2 g in sodium chloride 0.9 % 100 mL IVPB        2 g 200 mL/hr over 30  Minutes Intravenous Every 12 hours 03/14/22 0645     03/14/22 0800  ceFEPIme (MAXIPIME) 2 g in sodium chloride 0.9 % 100 mL IVPB  Status:  Discontinued        2 g 200 mL/hr over 30 Minutes Intravenous Every 24 hours 03/13/22 1455 03/14/22 0645   03/13/22 0815  vancomycin (VANCOREADY) IVPB 1250 mg/250 mL        1,250 mg 166.7 mL/hr over 90 Minutes Intravenous  Once 03/13/22 0806 03/13/22 1100   03/13/22 0800  ceFEPIme (MAXIPIME) 2 g in sodium chloride 0.9 % 100 mL IVPB        2 g 200 mL/hr over 30 Minutes Intravenous  Once 03/13/22 0759 03/13/22 0926   03/13/22 0800  metroNIDAZOLE (FLAGYL) IVPB 500 mg        500 mg 100 mL/hr over 60 Minutes Intravenous  Once 03/13/22 0759 03/13/22  0957   03/13/22 0800  vancomycin (VANCOCIN) IVPB 1000 mg/200 mL premix  Status:  Discontinued        1,000 mg 200 mL/hr over 60 Minutes Intravenous  Once 03/13/22 0759 03/13/22 0806              Family Communication/Anticipated D/C date and plan/Code Status   DVT prophylaxis: enoxaparin (LOVENOX) injection 40 mg Start: 03/14/22 2200 SCDs Start: 03/13/22 1415     Code Status: Full Code  Family Communication: None Disposition Plan: Plan to discharge home in 1 to 2 days   Status is: Inpatient Remains inpatient appropriate because: On IV antibiotics       Subjective:   C/o cough, lower abdominal pain  Objective:    Vitals:   03/14/22 0400 03/14/22 0600 03/14/22 0700 03/14/22 0800  BP: 123/81 (!) 152/91 (!) 153/119 (!) 154/95  Pulse: (!) 108 (!) 103 (!) 109 (!) 103  Resp: 16 16 16 17   Temp: 98 F (36.7 C)     TempSrc: Oral     SpO2: 95% 97% 98% 97%  Weight:      Height:       No data found.   Intake/Output Summary (Last 24 hours) at 03/14/2022 0850 Last data filed at 03/14/2022 0734 Gross per 24 hour  Intake 3097.73 ml  Output 300 ml  Net 2797.73 ml   Filed Weights   03/13/22 0744 03/13/22 1632  Weight: 64 kg 68 kg    Exam:   GEN: NAD SKIN: Warm and dry EYES: PERRL, icterus ENT: MMM CV: RRR, tachycardic PULM: CTA B ABD: soft, ND, suprapubic trenderness, +BS CNS: AAO x 3, non focal EXT: No edema or tenderness       Data Reviewed:   I have personally reviewed following labs and imaging studies:  Labs: Labs show the following:   Basic Metabolic Panel: Recent Labs  Lab 03/13/22 0829 03/13/22 1038 03/13/22 1407 03/13/22 1640 03/14/22 0135  NA 127* 132* 140 141 146*  K 6.2* 6.5* 4.5 4.6 3.7  CL 89* 100 105 110 116*  CO2 8* 9* 12* 11* 23  GLUCOSE 960* 779* 366* 254* 174*  BUN 56* 55* 54* 47* 39*  CREATININE 2.31* 2.03* 1.88* 1.63* 1.17*  CALCIUM 9.5 8.7* 9.6 9.6 9.4   GFR Estimated Creatinine Clearance: 46.3 mL/min (A) (by  C-G formula based on SCr of 1.17 mg/dL (H)). Liver Function Tests: Recent Labs  Lab 03/13/22 0829  AST 16  ALT 16  ALKPHOS 153*  BILITOT 2.0*  PROT 9.6*  ALBUMIN 4.5   No results for input(s): "LIPASE", "AMYLASE" in the  last 168 hours. No results for input(s): "AMMONIA" in the last 168 hours. Coagulation profile Recent Labs  Lab 03/13/22 0829  INR 1.3*    CBC: Recent Labs  Lab 03/13/22 0829 03/14/22 0135  WBC 19.2* 10.3  NEUTROABS 17.5*  --   HGB 14.3 12.8  HCT 45.0 36.1  MCV 92.4 82.0  PLT 406* 313   Cardiac Enzymes: No results for input(s): "CKTOTAL", "CKMB", "CKMBINDEX", "TROPONINI" in the last 168 hours. BNP (last 3 results) No results for input(s): "PROBNP" in the last 8760 hours. CBG: Recent Labs  Lab 03/13/22 2336 03/14/22 0055 03/14/22 0301 03/14/22 0448 03/14/22 0646  GLUCAP 169* 179* 143* 173* 162*   D-Dimer: No results for input(s): "DDIMER" in the last 72 hours. Hgb A1c: No results for input(s): "HGBA1C" in the last 72 hours. Lipid Profile: No results for input(s): "CHOL", "HDL", "LDLCALC", "TRIG", "CHOLHDL", "LDLDIRECT" in the last 72 hours. Thyroid function studies: No results for input(s): "TSH", "T4TOTAL", "T3FREE", "THYROIDAB" in the last 72 hours.  Invalid input(s): "FREET3" Anemia work up: No results for input(s): "VITAMINB12", "FOLATE", "FERRITIN", "TIBC", "IRON", "RETICCTPCT" in the last 72 hours. Sepsis Labs: Recent Labs  Lab 03/13/22 0829 03/13/22 1038 03/14/22 0135  PROCALCITON 1.00  --  0.79  WBC 19.2*  --  10.3  LATICACIDVEN 2.1* 1.4  --     Microbiology Recent Results (from the past 240 hour(s))  Resp Panel by RT-PCR (Flu A&B, Covid) Anterior Nasal Swab     Status: None   Collection Time: 03/13/22  8:00 AM   Specimen: Anterior Nasal Swab  Result Value Ref Range Status   SARS Coronavirus 2 by RT PCR NEGATIVE NEGATIVE Final    Comment: (NOTE) SARS-CoV-2 target nucleic acids are NOT DETECTED.  The SARS-CoV-2 RNA  is generally detectable in upper respiratory specimens during the acute phase of infection. The lowest concentration of SARS-CoV-2 viral copies this assay can detect is 138 copies/mL. A negative result does not preclude SARS-Cov-2 infection and should not be used as the sole basis for treatment or other patient management decisions. A negative result may occur with  improper specimen collection/handling, submission of specimen other than nasopharyngeal swab, presence of viral mutation(s) within the areas targeted by this assay, and inadequate number of viral copies(<138 copies/mL). A negative result must be combined with clinical observations, patient history, and epidemiological information. The expected result is Negative.  Fact Sheet for Patients:  EntrepreneurPulse.com.au  Fact Sheet for Healthcare Providers:  IncredibleEmployment.be  This test is no t yet approved or cleared by the [place] FDA and  has been authorized for detection and/or diagnosis of SARS-CoV-2 by FDA under an Emergency Use Authorization (EUA). This EUA will remain  in effect (meaning this test can be used) for the duration of the COVID-19 declaration under Section 564(b)(1) of the Act, 21 U.S.C.section 360bbb-3(b)(1), unless the authorization is terminated  or revoked sooner.       Influenza A by PCR NEGATIVE NEGATIVE Final   Influenza B by PCR NEGATIVE NEGATIVE Final    Comment: (NOTE) The Xpert Xpress SARS-CoV-2/FLU/RSV plus assay is intended as an aid in the diagnosis of influenza from Nasopharyngeal swab specimens and should not be used as a sole basis for treatment. Nasal washings and aspirates are unacceptable for Xpert Xpress SARS-CoV-2/FLU/RSV testing.  Fact Sheet for Patients: EntrepreneurPulse.com.au  Fact Sheet for Healthcare Providers: IncredibleEmployment.be  This test is not yet approved or cleared by the  [place] FDA and has been authorized for detection and/or  diagnosis of SARS-CoV-2 by FDA under an Emergency Use Authorization (EUA). This EUA will remain in effect (meaning this test can be used) for the duration of the COVID-19 declaration under Section 564(b)(1) of the Act, 21 U.S.C. section 360bbb-3(b)(1), unless the authorization is terminated or revoked.  Performed at [facility], [address] [place], [place] [address]   Blood Culture (routine x 2)     Status: None (Preliminary result)   Collection Time: 03/13/22  8:29 AM   Specimen: BLOOD  Result Value Ref Range Status   Specimen Description BLOOD RIGHT ANTECUBITAL  Final   Special Requests   Final    BOTTLES DRAWN AEROBIC AND ANAEROBIC Blood Culture adequate volume   Culture   Final    NO GROWTH < 24 HOURS Performed at [facility], [address], [place], [place] [address]    Report Status PENDING  Incomplete  Blood Culture (routine x 2)     Status: None (Preliminary result)   Collection Time: 03/13/22  8:29 AM   Specimen: BLOOD  Result Value Ref Range Status   Specimen Description BLOOD LEFT ANTECUBITAL  Final   Special Requests   Final    BOTTLES DRAWN AEROBIC AND ANAEROBIC Blood Culture results may not be optimal due to an excessive volume of blood received in culture bottles   Culture   Final    NO GROWTH < 24 HOURS Performed at [facility], [address] [place], [place] [address]    Report Status PENDING  Incomplete  MRSA Next Gen by PCR, Nasal     Status: None   Collection Time: 03/13/22  4:40 PM   Specimen: Nasal Mucosa; Nasal Swab  Result Value Ref Range Status   MRSA by PCR Next Gen NOT DETECTED NOT DETECTED Final    Comment: (NOTE) The GeneXpert MRSA Assay (FDA approved for NASAL specimens only), is one component of a comprehensive MRSA colonization surveillance program. It is not intended to diagnose MRSA infection nor to guide or monitor treatment for MRSA  infections. Test performance is not FDA approved in patients less than 65 years old. Performed at [facility], [address] [place], [place] [address]     Procedures and diagnostic studies:  CT HEAD WO CONTRAST (5MM)  Result Date: 03/13/2022 CLINICAL DATA:  Mental status change. EXAM: CT HEAD WITHOUT CONTRAST TECHNIQUE: Contiguous axial images were obtained from the base of the skull through the vertex without intravenous contrast. RADIATION DOSE REDUCTION: This exam was performed according to the departmental dose-optimization program which includes automated exposure control, adjustment of the mA and/or kV according to patient size and/or use of iterative reconstruction technique. COMPARISON:  01/11/2021 FINDINGS: Brain: There is no evidence for acute hemorrhage, hydrocephalus, or abnormal extra-axial fluid collection. No definite CT evidence for acute infarction. 12 mm calcified extra-axial lesion in the left temporal region is stable, likely small meningioma. No associated mass effect or edema. Vascular: No hyperdense vessel or unexpected calcification. Skull: No evidence for fracture. No worrisome lytic or sclerotic lesion. Sinuses/Orbits: The visualized paranasal sinuses and mastoid air cells are clear. Visualized portions of the globes and intraorbital fat are unremarkable. Other: None. IMPRESSION: 1. Stable.  No acute intracranial abnormality. 2. Stable 12 mm calcified extra-axial lesion in the left temporal region, likely small meningioma. Electronically Signed   By: Misty Stanley M.D.   On: 03/13/2022 09:23   CT ABDOMEN PELVIS WO CONTRAST  Result Date: 03/13/2022 CLINICAL DATA:  Abdominal pain EXAM: CT  ABDOMEN AND PELVIS WITHOUT CONTRAST TECHNIQUE: Multidetector CT imaging of the abdomen and pelvis was performed following the standard protocol without IV contrast. RADIATION DOSE REDUCTION: This exam was performed according to the departmental dose-optimization program which  includes automated exposure control, adjustment of the mA and/or kV according to patient size and/or use of iterative reconstruction technique. COMPARISON:  01/11/2021 FINDINGS: Lower chest: Multifocal, bilateral patchy airspace and ground-glass densities are noted within the imaged portions of the lung bases. No pleural effusion. Hepatobiliary: No focal liver abnormality is seen. No gallstones, gallbladder wall thickening, or biliary dilatation. Pancreas: Unremarkable. No pancreatic ductal dilatation or surrounding inflammatory changes. Spleen: Normal in size without focal abnormality. Adrenals/Urinary Tract: Normal adrenal glands. No nephrolithiasis or hydronephrosis. No suspicious kidney mass. Urinary bladder is unremarkable. Stomach/Bowel: Stomach appears normal. The appendix is visualized and is within normal limits. No bowel wall thickening, inflammation, or distension. Vascular/Lymphatic: Aortic atherosclerosis. No aneurysm. No signs of abdominopelvic adenopathy. Reproductive: Status post hysterectomy. No adnexal masses. Other: No free fluid or fluid collections. No signs of pneumoperitoneum. Musculoskeletal: No acute or significant osseous findings. IMPRESSION: 1. No acute findings within the abdomen or pelvis. 2. Multifocal, bilateral patchy airspace and ground-glass densities are noted within the imaged portions of the lung bases. Imaging features can be seen with viral pneumonia, though are nonspecific and can occur with a variety of infectious and noninfectious processes. 3. Aortic Atherosclerosis (ICD10-I70.0). Electronically Signed   By: Kerby Moors M.D.   On: 03/13/2022 09:22   DG Chest Port 1 View  Result Date: 03/13/2022 CLINICAL DATA:  61 year old female with possible sepsis. EXAM: PORTABLE CHEST 1 VIEW COMPARISON:  Portable chest 01/11/2021 and earlier. FINDINGS: Portable AP semi upright view at 0827 hours. Low normal lung volumes. Normal cardiac size and mediastinal contours. Visualized  tracheal air column is within normal limits. Allowing for portable technique the lungs are clear. No pneumothorax or pleural effusion. No osseous abnormality identified. Paucity of bowel gas in the upper abdomen. IMPRESSION: Negative portable chest. Electronically Signed   By: Genevie Ann M.D.   On: 03/13/2022 08:45               LOS: 1 day   Aiyana Stegmann  [facility]   Pager on [email or web address]. If 7PM-7AM, please contact night-coverage at [email or web address]     03/14/2022, 8:50 AM

## 2022-03-14 NOTE — Progress Notes (Signed)
Report called to Marisue Ivan on 1A

## 2022-03-14 NOTE — Plan of Care (Signed)

## 2022-03-15 LAB — BASIC METABOLIC PANEL
Anion gap: 10 (ref 5–15)
BUN: 24 mg/dL — ABNORMAL HIGH (ref 6–20)
CO2: 21 mmol/L — ABNORMAL LOW (ref 22–32)
Calcium: 8.2 mg/dL — ABNORMAL LOW (ref 8.9–10.3)
Chloride: 105 mmol/L (ref 98–111)
Creatinine, Ser: 0.94 mg/dL (ref 0.44–1.00)
GFR, Estimated: >60 mL/min (ref >60)
Glucose, Bld: 219 mg/dL — ABNORMAL HIGH (ref 70–99)
Potassium: 3.3 mmol/L — ABNORMAL LOW (ref 3.5–5.1)
Sodium: 136 mmol/L (ref 135–145)

## 2022-03-15 LAB — GLUCOSE, CAPILLARY
Glucose-Capillary: 248 mg/dL — ABNORMAL HIGH (ref 70–99)
Glucose-Capillary: 248 mg/dL — ABNORMAL HIGH (ref 70–99)
Glucose-Capillary: 235 mg/dL — ABNORMAL HIGH (ref 70–99)
Glucose-Capillary: 234 mg/dL — ABNORMAL HIGH (ref 70–99)
Glucose-Capillary: 325 mg/dL — ABNORMAL HIGH (ref 70–99)

## 2022-03-15 LAB — PROCALCITONIN: Procalcitonin: 0.3 ng/mL

## 2022-03-15 LAB — PHOSPHORUS: Phosphorus: 1.5 mg/dL — ABNORMAL LOW (ref 2.5–4.6)

## 2022-03-15 LAB — MAGNESIUM: Magnesium: 2.3 mg/dL (ref 1.7–2.4)

## 2022-03-15 MED ORDER — AMOXICILLIN-POT CLAVULANATE 875-125 MG PO TABS
1.0000 | ORAL_TABLET | Freq: Two times a day (BID) | ORAL | Status: DC
  Administered 2022-03-15 (×2): 1 via ORAL
  Filled 2022-03-15 (×2): qty 1

## 2022-03-15 MED ORDER — POTASSIUM PHOSPHATES 15 MMOLE/5ML IV SOLN
30.0000 mmol | Freq: Once | INTRAVENOUS | Status: AC
  Administered 2022-03-15: 30 mmol via INTRAVENOUS
  Filled 2022-03-15: qty 10

## 2022-03-15 MED ORDER — NOVOLOG FLEXPEN 100 UNIT/ML ~~LOC~~ SOPN: 15.0000 [IU] | PEN_INJECTOR | Freq: Three times a day (TID) | SUBCUTANEOUS | Status: DC

## 2022-03-15 MED ORDER — INSULIN GLARGINE-YFGN 100 UNIT/ML ~~LOC~~ SOLN
40.0000 [IU] | Freq: Every day | SUBCUTANEOUS | Status: DC
Start: 2022-03-15 — End: 2022-03-16
  Filled 2022-03-15: qty 0.4

## 2022-03-15 MED ORDER — POTASSIUM CHLORIDE CRYS ER 20 MEQ PO TBCR
40.0000 meq | EXTENDED_RELEASE_TABLET | Freq: Once | ORAL | Status: AC
Start: 2022-03-15 — End: 2022-03-15
  Administered 2022-03-15: 40 meq via ORAL
  Filled 2022-03-15: qty 2

## 2022-03-15 MED ORDER — AMOXICILLIN-POT CLAVULANATE 875-125 MG PO TABS
1.0000 | ORAL_TABLET | Freq: Two times a day (BID) | ORAL | 0 refills | Status: AC
Start: 2022-03-15 — End: 2022-03-18

## 2022-03-15 NOTE — Progress Notes (Signed)
Pt was informed of reason for labs, patient declined to stay until results have returned. Patient educated on the danger behind this decision. Patient is aware and states, "I want to go home." Patient orders for discharge to be placed by MD.

## 2022-03-15 NOTE — TOC Initial Note (Signed)
Transition of Care [facility]) - Initial/Assessment Note    Patient Details  Name: Pamela Lane MRN: [id number] Date of Birth: Jun 04, 1961  Transition [facility] CM/SW Contact:    Durenda Guthrie, RN Phone Number: 03/15/2022, 11:56 AM  Clinical Narrative:                 Transition of Care Screening Note:  Transition of Care Department [facility]) has reviewed patient and no TOC needs have been identified at this time. We will continue to monitor patient advancement through Interdisciplinary progressions. If new patient transition needs arise, please place a consult.          Patient Goals and CMS Choice        Expected Discharge Plan and Services                                                Prior Living Arrangements/Services                       Activities of Daily Living   ADL Screening (condition at time of admission) Patient able to express need for assistance with ADLs?: Yes Does the patient have difficulty dressing or bathing?: No Independently performs ADLs?: Yes (appropriate for developmental age)  Permission Sought/Granted                  Emotional Assessment              Admission diagnosis:  DKA (diabetic ketoacidosis) (HCC) [E11.10] Acute cystitis without hematuria [N30.00] AKI (acute kidney injury) (HCC) [N17.9] Diabetic ketoacidosis without coma associated with other specified diabetes mellitus (HCC) [E13.10] Sepsis, due to unspecified organism, unspecified whether acute organ dysfunction present [facility]) [A41.9] Patient Active Problem List   Diagnosis Date Noted   Severe sepsis (HCC) 03/13/2022   SIRS (systemic inflammatory response syndrome) (HCC) 01/11/2021   Hypotension 01/11/2021   Abdominal pain 01/11/2021   Chest pain 01/11/2021   HLD (hyperlipidemia) 01/11/2021   Asthma 01/11/2021   GERD (gastroesophageal reflux disease) 01/11/2021   Hyperkalemia 01/11/2021   Alcohol abuse 01/11/2021   Type II diabetes  mellitus with renal manifestations (HCC) 01/11/2021   Acute metabolic encephalopathy 01/11/2021   Cocaine abuse (HCC)    Acute respiratory distress    SVT (supraventricular tachycardia) (HCC)    Leukocytosis    Hypokalemia    Hypophosphatemia    DKA (diabetic ketoacidosis) (HCC) 08/27/2020   Tobacco abuse 10/04/2019   DKA, type 2 (HCC) 10/02/2019   Essential hypertension 10/02/2019   Bipolar disorder (HCC) 01/17/2019   Acute pancreatitis 05/15/2016   Acute renal failure superimposed on stage 3a chronic kidney disease (HCC)    Diabetic ketoacidosis without coma associated with type 2 diabetes mellitus (HCC)    Hyperglycemia due to type 2 diabetes mellitus (HCC) 01/13/2016   Acute UTI 01/13/2016   DKA (diabetic ketoacidoses) 01/08/2016   Hyponatremia 01/08/2016   Dehydration 01/08/2016   Urinary tract infection 01/08/2016   Upper abdominal pain 01/08/2016   Nausea & vomiting 01/08/2016   PCP:  [facility] Pharmacy:   [facility] [address]), [place] - [address] [place]) [place] [address] Phone: [phone] Fax: [phone]  [facility] [place], [place] - [address] [place]  [place] [address] Phone: [phone] Fax: [phone]  [facility] [place], [place] - [address] [place] [address] [place] [address] Phone: [phone] Fax: [phone]     Social Determinants of Health (SDOH) Interventions    Readmission Risk Interventions    01/14/2021   11:03 AM 10/10/2020    4:01 PM 10/04/2019   11:22 AM  Readmission Risk Prevention Plan  Transportation Screening Complete Complete Complete  PCP or Specialist Appt within 3-5 Days  Complete Complete  HRI or Home Care Consult  Complete Complete  Social Work Consult for Recovery Care Planning/Counseling  Complete   Palliative Care Screening  Not Applicable    Medication Review [facility]) Referral to Pharmacy Complete Complete  PCP or Specialist appointment within 3-5 days of discharge Not Complete    PCP/Specialist Appt Not Complete comments patient left AMA    HRI or Home Care Consult Not Complete    HRI or Home Care Consult Pt Refusal Comments patient is independent    SW Recovery Care/Counseling Consult Complete    Palliative Care Screening Not Applicable    Skilled Nursing Facility Not Applicable

## 2022-03-15 NOTE — Hospital Course (Addendum)
Pamela Lane is a 60 y.o. female with medical history significant for insulin-dependent diabetes mellitus with history of DKA, asthma, hypertension, depression, bipolar disorder, history of alcohol use disorder, history of acute pancreatitis, C. difficile colitis, tobacco use disorder, cocaine use disorder, SVT, UTI, CKD stage IIIa.  She presented to the hospital with generalized weakness, nausea, vomiting and lower abdominal pain.  She also complained of foul-smelling urine and increased frequency of micturition but does not report dysuria or hematuria.  She said she has been taking her medications including insulin as prescribed.   She was found to have DKA, severe sepsis secondary to acute UTI complicated by AKI and hyperkalemia.  She also had bibasilar opacities that was questionable for atelectasis versus infiltrates.  She was treated with IV fluids, empiric IV antibiotics and IV insulin drip per DKA protocol.  She had hypernatremia and hyponatremia that improved with IV fluids.  She also had hypokalemia and hypophosphatemia that were repleted.  However, she did not want to wait for repeat labs. She wanted to be discharged immediately after completing IV potassium phosphate infusion. Her condition has improved and she is deemed stable for discharge home.

## 2022-03-15 NOTE — Plan of Care (Signed)

## 2022-03-15 NOTE — Plan of Care (Signed)
Problem: Education: Goal: Knowledge of General Education information will improve Description: Including pain rating scale, medication(s)/side effects and non-pharmacologic comfort measures 03/15/2022 0732 by Berneta Sages, RN Outcome: Progressing 03/14/2022 1834 by Berneta Sages, RN Outcome: Progressing   Problem: Health Behavior/Discharge Planning: Goal: Ability to manage health-related needs will improve 03/15/2022 0732 by Berneta Sages, RN Outcome: Progressing 03/14/2022 1834 by Berneta Sages, RN Outcome: Progressing   Problem: Clinical Measurements: Goal: Ability to maintain clinical measurements within normal limits will improve 03/15/2022 0732 by Berneta Sages, RN Outcome: Progressing 03/14/2022 1834 by Berneta Sages, RN Outcome: Progressing Goal: Will remain free from infection 03/15/2022 0732 by Berneta Sages, RN Outcome: Progressing 03/14/2022 1834 by Berneta Sages, RN Outcome: Progressing Goal: Diagnostic test results will improve 03/15/2022 0732 by Berneta Sages, RN Outcome: Progressing 03/14/2022 1834 by Berneta Sages, RN Outcome: Progressing Goal: Respiratory complications will improve 03/15/2022 0732 by Berneta Sages, RN Outcome: Progressing 03/14/2022 1834 by Berneta Sages, RN Outcome: Progressing Goal: Cardiovascular complication will be avoided 03/15/2022 0732 by Berneta Sages, RN Outcome: Progressing 03/14/2022 1834 by Berneta Sages, RN Outcome: Progressing   Problem: Activity: Goal: Risk for activity intolerance will decrease 03/15/2022 0732 by Berneta Sages, RN Outcome: Progressing 03/14/2022 1834 by Berneta Sages, RN Outcome: Progressing   Problem: Nutrition: Goal: Adequate nutrition will be maintained 03/15/2022 0732 by Berneta Sages, RN Outcome: Progressing 03/14/2022 1834 by Berneta Sages, RN Outcome: Progressing   Problem: Coping: Goal: Level of anxiety will  decrease 03/15/2022 0732 by Berneta Sages, RN Outcome: Progressing 03/14/2022 1834 by Berneta Sages, RN Outcome: Progressing   Problem: Elimination: Goal: Will not experience complications related to bowel motility 03/15/2022 0732 by Berneta Sages, RN Outcome: Progressing 03/14/2022 1834 by Berneta Sages, RN Outcome: Progressing Goal: Will not experience complications related to urinary retention 03/15/2022 0732 by Berneta Sages, RN Outcome: Progressing 03/14/2022 1834 by Berneta Sages, RN Outcome: Progressing   Problem: Pain Managment: Goal: General experience of comfort will improve 03/15/2022 0732 by Berneta Sages, RN Outcome: Progressing 03/14/2022 1834 by Berneta Sages, RN Outcome: Progressing   Problem: Safety: Goal: Ability to remain free from injury will improve 03/15/2022 0732 by Berneta Sages, RN Outcome: Progressing 03/14/2022 1834 by Berneta Sages, RN Outcome: Progressing   Problem: Skin Integrity: Goal: Risk for impaired skin integrity will decrease 03/15/2022 0732 by Berneta Sages, RN Outcome: Progressing 03/14/2022 1834 by Berneta Sages, RN Outcome: Progressing   Problem: Education: Goal: Ability to describe self-care measures that may prevent or decrease complications (Diabetes Survival Skills Education) will improve 03/15/2022 0732 by Berneta Sages, RN Outcome: Progressing 03/14/2022 1834 by Berneta Sages, RN Outcome: Progressing Goal: Individualized Educational Video(s) 03/15/2022 0732 by Berneta Sages, RN Outcome: Progressing 03/14/2022 1834 by Berneta Sages, RN Outcome: Progressing   Problem: Coping: Goal: Ability to adjust to condition or change in health will improve 03/15/2022 0732 by Berneta Sages, RN Outcome: Progressing 03/14/2022 1834 by Berneta Sages, RN Outcome: Progressing   Problem: Fluid Volume: Goal: Ability to maintain a balanced intake and output will  improve 03/15/2022 0732 by Berneta Sages, RN Outcome: Progressing 03/14/2022 1834 by Berneta Sages, RN Outcome: Progressing   Problem: Health Behavior/Discharge Planning: Goal: Ability to identify and utilize available resources and services will improve 03/15/2022 0732 by Berneta Sages, RN Outcome: Progressing 03/14/2022 1834 by Berneta Sages, RN Outcome: Progressing Goal: Ability to manage health-related needs will improve 03/15/2022 0732 by Berneta Sages, RN Outcome: Progressing 03/14/2022 1834 by Berneta Sages, RN Outcome: Progressing   Problem: Metabolic: Goal: Ability to maintain appropriate glucose levels will improve  03/15/2022 0732 by Berneta Sages, RN Outcome: Progressing 03/14/2022 1834 by Berneta Sages, RN Outcome: Progressing   Problem: Nutritional: Goal: Maintenance of adequate nutrition will improve 03/15/2022 0732 by Berneta Sages, RN Outcome: Progressing 03/14/2022 1834 by Berneta Sages, RN Outcome: Progressing Goal: Progress toward achieving an optimal weight will improve 03/15/2022 0732 by Berneta Sages, RN Outcome: Progressing 03/14/2022 1834 by Berneta Sages, RN Outcome: Progressing   Problem: Skin Integrity: Goal: Risk for impaired skin integrity will decrease 03/15/2022 0732 by Berneta Sages, RN Outcome: Progressing 03/14/2022 1834 by Berneta Sages, RN Outcome: Progressing   Problem: Tissue Perfusion: Goal: Adequacy of tissue perfusion will improve 03/15/2022 0732 by Berneta Sages, RN Outcome: Progressing 03/14/2022 1834 by Berneta Sages, RN Outcome: Progressing

## 2022-03-15 NOTE — Inpatient Diabetes Management (Addendum)
Inpatient Diabetes Program Recommendations  AACE/ADA: New Consensus Statement on Inpatient Glycemic Control   Target Ranges:  Prepandial:   less than 140 mg/dL      Peak postprandial:   less than 180 mg/dL (1-2 hours)      Critically ill patients:  140 - 180 mg/dL    Latest Reference Range & Units 03/15/22 05:23 03/15/22 07:15  Glucose-Capillary 70 - 99 mg/dL 245 (H) 809 (H)    Latest Reference Range & Units 03/14/22 00:55 03/14/22 03:01 03/14/22 04:48 03/14/22 06:46 03/14/22 09:50 03/14/22 10:50 03/14/22 17:35 03/14/22 21:18  Glucose-Capillary 70 - 99 mg/dL 983 (H) 382 (H) 505 (H) 162 (H) 172 (H) 291 (H) 310 (H) 183 (H)    Latest Reference Range & Units 08/27/20 17:30 10/08/20 01:30 11/16/20 03:52 01/11/21 13:44 01/12/21 10:18 03/14/22 01:35  Hemoglobin A1C 4.8 - 5.6 % 13.7 (H) 13.6 (H) 14.3 (H) >15.5 (H) 15.0 (H) 13.0 (H)    Latest Reference Range & Units 03/13/22 08:29  CO2 22 - 32 mmol/L 8 (L)  Glucose 70 - 99 mg/dL 397 (HH)  Anion gap 5 - 15  30 (H)   Review of Glycemic Control  Diabetes history: DM2 Outpatient Diabetes medications: Lantus 25 units BID, Januvia 100 mg daily, Novolog 15 units TID with meals, Xigduo XR 04-999 mg daily Current orders for Inpatient glycemic control: Semglee 40 units daily, Novolog 0-15 units TID with meals, Novolog 0-5 units QHS  Inpatient Diabetes Program Recommendations:    Insulin: Please consider ordering Novolog 7 units TID with meals for meal coverage if patient eats at least 50% of meals.  HbgA1C: A1C 13% on 03/14/22 indicating an average glucose of 326 mg/dl over the past 2-3 months.  NOTE: Patient admitted with DKA, UTI, sepsis, acute on chronic kidney disease. Per chart, patient was seeing [facility] for DM management but last office visit was 05/01/20.  Initial glucose 960 mg/dl on 6/73/41 and was initially ordered IV insulin which was transitioned to SQ insulin on 03/14/22. Patient received Semglee 35 units at 8:59 am on  03/14/22. Noted Semglee increased from 35 to 40 units today.   Addendum 03/15/22@11 :05-Spoke with patient at bedside regarding DM control. Patient reports that she is currently going to [facility] for DM management. Patient reports that she was going to [facility]ocr[facility] but needs to get re-established with them. Patient reports that she was living in [place] for 1 year and she moved back here to [facility] in May 2023. Patient reports that the last changes made with her DM medications were made by the clinic in [place] that she was going to. Patient reports that she is taking medications but admits that sometime she forgets to take insulin and/or oral DM medication. Patient reports that she frequently forgets to take meal time insulin. Patient reports her glucose is "all over the place". When asked about specific values, she states it can range from 100-400's mg/dl. Patient states her last A1C was 17%.  Discussed A1C results (13% on 03/14/22) and explained that current A1C indicates an average glucose of 326 mg/dl over the past 2-3 months. Discussed glucose and A1C goals. Discussed importance of checking CBGs and maintaining good CBG control to prevent long-term and short-term complications. Explained how hyperglycemia leads to damage within blood vessels which lead to the common complications seen with uncontrolled diabetes. Stressed to the patient the importance of improving glycemic control to prevent further complications from uncontrolled diabetes. Patient was tearful at times during conversation. She reported that at  times she gets frustrated about her diabetes because it is all over the place even when she tries to do right and take her medications as prescribed. She also stated that anytime she gets sick, her family accuses her of being back on drugs and that upsets her even more. Provided emotional support and encouraged patient to try to find ways to remember to take her medications consistently to help  improve DM control. Also encouraged patient to call [facility] and get an appointment to re-establish care with them. Patient states that she has all DM medications and supplies.  Patient verbalized understanding of information discussed and reports no further questions at this time related to diabetes.   Thanks, Orlando Penner, RN, MSN, CDCES Diabetes Coordinator Inpatient Diabetes Program [phone] (Team Pager from 8am to 5pm)

## 2022-03-16 NOTE — Discharge Summary (Signed)
Physician Discharge Summary   Patient: Pamela Lane MRN: [id number] DOB: 1960/09/30  Admit date:     03/13/2022  Discharge date: 03/15/2022  Discharge Physician: Lurene Shadow   PCP: [facility]   Recommendations at discharge:   Follow-up with PCP in 1 week  Discharge Diagnoses: Principal Problem:   DKA (diabetic ketoacidosis) (HCC) Active Problems:   Severe sepsis (HCC)   Acute UTI   Hyponatremia   Acute renal failure superimposed on stage 3a chronic kidney disease (HCC)   Hyperkalemia  Resolved Problems:   * No resolved hospital problems. *  Hospital Course: Pamela Lane is a 61 y.o. female with medical history significant for insulin-dependent diabetes mellitus with history of DKA, asthma, hypertension, depression, bipolar disorder, history of alcohol use disorder, history of acute pancreatitis, C. difficile colitis, tobacco use disorder, cocaine use disorder, SVT, UTI, CKD stage IIIa.  She presented to the hospital with generalized weakness, nausea, vomiting and lower abdominal pain.  She also complained of foul-smelling urine and increased frequency of micturition but does not report dysuria or hematuria.  She said she has been taking her medications including insulin as prescribed.   She was found to have DKA, severe sepsis secondary to acute UTI complicated by AKI and hyperkalemia.  She also had bibasilar opacities that was questionable for atelectasis versus infiltrates.  She was treated with IV fluids, empiric IV antibiotics and IV insulin drip per DKA protocol.  She had hypernatremia and hyponatremia that improved with IV fluids.  She also had hypokalemia and hypophosphatemia that were repleted.  However, she did not want to wait for repeat labs. She wanted to be discharged immediately after completing IV potassium phosphate infusion. Her condition has improved and she is deemed stable for discharge home.           Consultants:  None Procedures performed: None  Disposition: Home Diet recommendation:  Discharge Diet Orders (From admission, onward)     Start     Ordered   03/15/22 0000  Diet - low sodium heart healthy        03/15/22 1603   03/15/22 0000  Diet Carb Modified        03/15/22 1603           Cardiac and Carb modified diet DISCHARGE MEDICATION: Allergies as of 03/15/2022   No Known Allergies      Medication List     STOP taking these medications    diltiazem 240 MG 24 hr capsule Commonly known as: CARDIZEM CD   metFORMIN 500 MG 24 hr tablet Commonly known as: GLUCOPHAGE-XR   multivitamin with minerals Tabs tablet       TAKE these medications    Allergy Relief/Indoor/Outdoor 10 MG tablet Generic drug: cetirizine Take 10 mg by mouth daily.   amoxicillin-clavulanate 875-125 MG tablet Commonly known as: AUGMENTIN Take 1 tablet by mouth every 12 (twelve) hours for 3 days.   atorvastatin 40 MG tablet Commonly known as: LIPITOR TAKE 1 TABLET BY MOUTH AT BEDTIME.   buPROPion 150 MG 12 hr tablet Commonly known as: WELLBUTRIN SR Take 150 mg by mouth 2 (two) times daily.   Daily Multivitamin Caps Take 1 tablet by mouth daily.   Dilt-XR 120 MG 24 hr capsule Generic drug: diltiazem Take 120 mg by mouth daily.   folic acid 1 MG tablet Commonly known as: FOLVITE Take 1 mg by mouth daily.   gabapentin 300 MG capsule Commonly known as: NEURONTIN Take 300 mg  by mouth 2 (two) times daily. What changed: Another medication with the same name was removed. Continue taking this medication, and follow the directions you see here.   hydrOXYzine 50 MG capsule Commonly known as: VISTARIL Take 50 mg by mouth 2 (two) times daily as needed.   insulin glargine 100 UNIT/ML Solostar Pen Commonly known as: LANTUS Inject 36 Units into the skin daily. What changed:  how much to take when to take this   Januvia 100 MG tablet Generic drug: sitaGLIPtin Take 100 mg by mouth daily.    lisinopril 10 MG tablet Commonly known as: ZESTRIL Take 10 mg by mouth daily.   nicotine 21 mg/24hr patch Commonly known as: NICODERM CQ - dosed in mg/24 hours Place 1 patch (21 mg total) onto the skin daily.   NovoLOG FlexPen 100 UNIT/ML FlexPen Generic drug: insulin aspart Inject 15 Units into the skin 3 (three) times daily with meals.   omeprazole 20 MG capsule Commonly known as: PRILOSEC Take 20 mg by mouth daily.   vitamin B-12 500 MCG tablet Commonly known as: CYANOCOBALAMIN Take 500 mcg by mouth daily.   Xigduo XR 04-999 MG Tb24 Generic drug: Dapagliflozin-metFORMIN HCl ER Take 1 tablet by mouth daily.        Discharge Exam: Filed Weights   03/13/22 0744 03/13/22 1632 03/14/22 1330  Weight: 64 kg 68 kg 68 kg   GEN: NAD SKIN: Warm and dry EYES: EOMI ENT: MMM CV: RRR PULM: CTA B ABD: soft, ND, NT, +BS CNS: AAO x 3, non focal EXT: No edema or tenderness   Condition at discharge: good  The results of significant diagnostics from this hospitalization (including imaging, microbiology, ancillary and laboratory) are listed below for reference.   Imaging Studies: DG Chest 2 View  Result Date: 03/14/2022 CLINICAL DATA:  Productive cough. EXAM: CHEST - 2 VIEW COMPARISON:  March 13, 2022. FINDINGS: The heart size and mediastinal contours are within normal limits. Left lung is clear. Small right basilar subsegmental atelectasis or infiltrate is noted. The visualized skeletal structures are unremarkable. IMPRESSION: Minimal right basilar subsegmental atelectasis or infiltrate. Electronically Signed   By: Marijo Conception M.D.   On: 03/14/2022 13:44   CT HEAD WO CONTRAST (5MM)  Result Date: 03/13/2022 CLINICAL DATA:  Mental status change. EXAM: CT HEAD WITHOUT CONTRAST TECHNIQUE: Contiguous axial images were obtained from the base of the skull through the vertex without intravenous contrast. RADIATION DOSE REDUCTION: This exam was performed according to the  departmental dose-optimization program which includes automated exposure control, adjustment of the mA and/or kV according to patient size and/or use of iterative reconstruction technique. COMPARISON:  01/11/2021 FINDINGS: Brain: There is no evidence for acute hemorrhage, hydrocephalus, or abnormal extra-axial fluid collection. No definite CT evidence for acute infarction. 12 mm calcified extra-axial lesion in the left temporal region is stable, likely small meningioma. No associated mass effect or edema. Vascular: No hyperdense vessel or unexpected calcification. Skull: No evidence for fracture. No worrisome lytic or sclerotic lesion. Sinuses/Orbits: The visualized paranasal sinuses and mastoid air cells are clear. Visualized portions of the globes and intraorbital fat are unremarkable. Other: None. IMPRESSION: 1. Stable.  No acute intracranial abnormality. 2. Stable 12 mm calcified extra-axial lesion in the left temporal region, likely small meningioma. Electronically Signed   By: Misty Stanley M.D.   On: 03/13/2022 09:23   CT ABDOMEN PELVIS WO CONTRAST  Result Date: 03/13/2022 CLINICAL DATA:  Abdominal pain EXAM: CT ABDOMEN AND PELVIS WITHOUT CONTRAST TECHNIQUE: Multidetector  CT imaging of the abdomen and pelvis was performed following the standard protocol without IV contrast. RADIATION DOSE REDUCTION: This exam was performed according to the departmental dose-optimization program which includes automated exposure control, adjustment of the mA and/or kV according to patient size and/or use of iterative reconstruction technique. COMPARISON:  01/11/2021 FINDINGS: Lower chest: Multifocal, bilateral patchy airspace and ground-glass densities are noted within the imaged portions of the lung bases. No pleural effusion. Hepatobiliary: No focal liver abnormality is seen. No gallstones, gallbladder wall thickening, or biliary dilatation. Pancreas: Unremarkable. No pancreatic ductal dilatation or surrounding  inflammatory changes. Spleen: Normal in size without focal abnormality. Adrenals/Urinary Tract: Normal adrenal glands. No nephrolithiasis or hydronephrosis. No suspicious kidney mass. Urinary bladder is unremarkable. Stomach/Bowel: Stomach appears normal. The appendix is visualized and is within normal limits. No bowel wall thickening, inflammation, or distension. Vascular/Lymphatic: Aortic atherosclerosis. No aneurysm. No signs of abdominopelvic adenopathy. Reproductive: Status post hysterectomy. No adnexal masses. Other: No free fluid or fluid collections. No signs of pneumoperitoneum. Musculoskeletal: No acute or significant osseous findings. IMPRESSION: 1. No acute findings within the abdomen or pelvis. 2. Multifocal, bilateral patchy airspace and ground-glass densities are noted within the imaged portions of the lung bases. Imaging features can be seen with viral pneumonia, though are nonspecific and can occur with a variety of infectious and noninfectious processes. 3. Aortic Atherosclerosis (ICD10-I70.0). Electronically Signed   By: Kerby Moors M.D.   On: 03/13/2022 09:22   DG Chest Port 1 View  Result Date: 03/13/2022 CLINICAL DATA:  61 year old female with possible sepsis. EXAM: PORTABLE CHEST 1 VIEW COMPARISON:  Portable chest 01/11/2021 and earlier. FINDINGS: Portable AP semi upright view at 0827 hours. Low normal lung volumes. Normal cardiac size and mediastinal contours. Visualized tracheal air column is within normal limits. Allowing for portable technique the lungs are clear. No pneumothorax or pleural effusion. No osseous abnormality identified. Paucity of bowel gas in the upper abdomen. IMPRESSION: Negative portable chest. Electronically Signed   By: Genevie Ann M.D.   On: 03/13/2022 08:45    Microbiology: Results for orders placed or performed during the hospital encounter of 03/13/22  Resp Panel by RT-PCR (Flu A&B, Covid) Anterior Nasal Swab     Status: None   Collection Time: 03/13/22   8:00 AM   Specimen: Anterior Nasal Swab  Result Value Ref Range Status   SARS Coronavirus 2 by RT PCR NEGATIVE NEGATIVE Final    Comment: (NOTE) SARS-CoV-2 target nucleic acids are NOT DETECTED.  The SARS-CoV-2 RNA is generally detectable in upper respiratory specimens during the acute phase of infection. The lowest concentration of SARS-CoV-2 viral copies this assay can detect is 138 copies/mL. A negative result does not preclude SARS-Cov-2 infection and should not be used as the sole basis for treatment or other patient management decisions. A negative result may occur with  improper specimen collection/handling, submission of specimen other than nasopharyngeal swab, presence of viral mutation(s) within the areas targeted by this assay, and inadequate number of viral copies(<138 copies/mL). A negative result must be combined with clinical observations, patient history, and epidemiological information. The expected result is Negative.  Fact Sheet for Patients:  EntrepreneurPulse.com.au  Fact Sheet for Healthcare Providers:  IncredibleEmployment.be  This test is no t yet approved or cleared by the [place] FDA and  has been authorized for detection and/or diagnosis of SARS-CoV-2 by FDA under an Emergency Use Authorization (EUA). This EUA will remain  in effect (meaning this test can be used) for the  duration of the COVID-19 declaration under Section 564(b)(1) of the Act, 21 U.S.C.section 360bbb-3(b)(1), unless the authorization is terminated  or revoked sooner.       Influenza A by PCR NEGATIVE NEGATIVE Final   Influenza B by PCR NEGATIVE NEGATIVE Final    Comment: (NOTE) The Xpert Xpress SARS-CoV-2/FLU/RSV plus assay is intended as an aid in the diagnosis of influenza from Nasopharyngeal swab specimens and should not be used as a sole basis for treatment. Nasal washings and aspirates are unacceptable for Xpert Xpress  SARS-CoV-2/FLU/RSV testing.  Fact Sheet for Patients: BloggerCourse.com  Fact Sheet for Healthcare Providers: SeriousBroker.it  This test is not yet approved or cleared by the [place] FDA and has been authorized for detection and/or diagnosis of SARS-CoV-2 by FDA under an Emergency Use Authorization (EUA). This EUA will remain in effect (meaning this test can be used) for the duration of the COVID-19 declaration under Section 564(b)(1) of the Act, 21 U.S.C. section 360bbb-3(b)(1), unless the authorization is terminated or revoked.  Performed at [facility], [address] [place], [place] [address]   Blood Culture (routine x 2)     Status: None (Preliminary result)   Collection Time: 03/13/22  8:29 AM   Specimen: BLOOD  Result Value Ref Range Status   Specimen Description BLOOD RIGHT ANTECUBITAL  Final   Special Requests   Final    BOTTLES DRAWN AEROBIC AND ANAEROBIC Blood Culture adequate volume   Culture   Final    NO GROWTH 3 DAYS Performed at [facility], [address], [place], [place] [address]    Report Status PENDING  Incomplete  Blood Culture (routine x 2)     Status: None (Preliminary result)   Collection Time: 03/13/22  8:29 AM   Specimen: BLOOD  Result Value Ref Range Status   Specimen Description BLOOD LEFT ANTECUBITAL  Final   Special Requests   Final    BOTTLES DRAWN AEROBIC AND ANAEROBIC Blood Culture results may not be optimal due to an excessive volume of blood received in culture bottles   Culture   Final    NO GROWTH 3 DAYS Performed at [facility], [address] [place], [place] [address]    Report Status PENDING  Incomplete  Urine Culture     Status: Abnormal   Collection Time: 03/13/22  9:17 AM   Specimen: In/Out Cath Urine  Result Value Ref Range Status   Specimen Description   Final    IN/OUT CATH URINE Performed at [facility], [address] [place], [place] [address]    Special Requests   Final    NONE Performed at [facility], [address] [place], [place] [address]    Culture (A)  Final    30,000 COLONIES/mL GROUP B STREP(S.AGALACTIAE)ISOLATED TESTING AGAINST S. AGALACTIAE NOT ROUTINELY PERFORMED DUE TO PREDICTABILITY OF AMP/PEN/VAN SUSCEPTIBILITY. WITHIN MIXED ORGANISMS Performed at [facility], [address] [place], [place] [address]    Report Status 03/14/2022 FINAL  Final  MRSA Next Gen by PCR, Nasal     Status: None   Collection Time: 03/13/22  4:40 PM   Specimen: Nasal Mucosa; Nasal Swab  Result Value Ref Range Status   MRSA by PCR Next Gen NOT DETECTED NOT DETECTED Final    Comment: (NOTE) The GeneXpert MRSA Assay (FDA approved for NASAL specimens only), is one component of a comprehensive MRSA colonization surveillance program. It is not intended to diagnose MRSA infection nor  to guide or monitor treatment for MRSA infections. Test performance is not FDA approved in patients less than 51 years old. Performed at [facility], [address] [place], [place] [address]     Labs: CBC: Recent Labs  Lab 03/13/22 0829 03/14/22 0135  WBC 19.2* 10.3  NEUTROABS 17.5*  --   HGB 14.3 12.8  HCT 45.0 36.1  MCV 92.4 82.0  PLT 406* [id number]   Basic Metabolic Panel: Recent Labs  Lab 03/13/22 1038 03/13/22 1407 03/13/22 1640 03/14/22 0135 03/15/22 0506  NA 132* 140 141 146* 136  K 6.5* 4.5 4.6 3.7 3.3*  CL 100 105 110 116* 105  CO2 9* 12* 11* 23 21*  GLUCOSE 779* 366* 254* 174* 219*  BUN 55* 54* 47* 39* 24*  CREATININE 2.03* 1.88* 1.63* 1.17* 0.94  CALCIUM 8.7* 9.6 9.6 9.4 8.2*  MG  --   --   --   --  2.3  PHOS  --   --   --   --  1.5*   Liver Function Tests: Recent Labs  Lab 03/13/22 0829  AST 16  ALT 16  ALKPHOS 153*  BILITOT 2.0*  PROT 9.6*  ALBUMIN 4.5   CBG: Recent Labs  Lab 03/15/22 0523 03/15/22 0715 03/15/22 1210 03/15/22 1706 03/15/22 2029   GLUCAP 248* 248* 235* 234* 325*    Discharge time spent: greater than 30 minutes.  Signed: Jennye Boroughs, MD [facility] 03/16/2022

## 2022-03-18 LAB — CULTURE, BLOOD (ROUTINE X 2)
Culture: NO GROWTH
Culture: NO GROWTH
Special Requests: ADEQUATE

## 2022-08-21 ENCOUNTER — Other Ambulatory Visit: Payer: Self-pay

## 2022-08-21 ENCOUNTER — Observation Stay
Admission: EM | Admit: 2022-08-21 | Discharge: 2022-08-22 | Disposition: A | Discharge status: Home / Self Care | Payer: Medicaid Other | Attending: Internal Medicine | Admitting: Internal Medicine

## 2022-08-21 ENCOUNTER — Emergency Department: Payer: Medicaid Other

## 2022-08-21 DIAGNOSIS — E1129 Type 2 diabetes mellitus with other diabetic kidney complication: Secondary | ICD-10-CM | POA: Diagnosis present

## 2022-08-21 DIAGNOSIS — F1910 Other psychoactive substance abuse, uncomplicated: Secondary | ICD-10-CM | POA: Diagnosis present

## 2022-08-21 DIAGNOSIS — E1165 Type 2 diabetes mellitus with hyperglycemia: Secondary | ICD-10-CM | POA: Diagnosis not present

## 2022-08-21 DIAGNOSIS — E1010 Type 1 diabetes mellitus with ketoacidosis without coma: Secondary | ICD-10-CM

## 2022-08-21 DIAGNOSIS — Z720 Tobacco use: Secondary | ICD-10-CM | POA: Diagnosis present

## 2022-08-21 DIAGNOSIS — E1110 Type 2 diabetes mellitus with ketoacidosis without coma: Principal | ICD-10-CM | POA: Diagnosis present

## 2022-08-21 DIAGNOSIS — R112 Nausea with vomiting, unspecified: Secondary | ICD-10-CM | POA: Diagnosis present

## 2022-08-21 DIAGNOSIS — Z794 Long term (current) use of insulin: Secondary | ICD-10-CM | POA: Insufficient documentation

## 2022-08-21 DIAGNOSIS — F1010 Alcohol abuse, uncomplicated: Secondary | ICD-10-CM | POA: Diagnosis present

## 2022-08-21 DIAGNOSIS — F17210 Nicotine dependence, cigarettes, uncomplicated: Secondary | ICD-10-CM | POA: Diagnosis not present

## 2022-08-21 DIAGNOSIS — E785 Hyperlipidemia, unspecified: Secondary | ICD-10-CM | POA: Diagnosis present

## 2022-08-21 DIAGNOSIS — R197 Diarrhea, unspecified: Secondary | ICD-10-CM | POA: Diagnosis not present

## 2022-08-21 DIAGNOSIS — J45909 Unspecified asthma, uncomplicated: Secondary | ICD-10-CM | POA: Diagnosis not present

## 2022-08-21 DIAGNOSIS — I471 Supraventricular tachycardia, unspecified: Secondary | ICD-10-CM | POA: Diagnosis present

## 2022-08-21 DIAGNOSIS — E1122 Type 2 diabetes mellitus with diabetic chronic kidney disease: Secondary | ICD-10-CM | POA: Insufficient documentation

## 2022-08-21 DIAGNOSIS — N390 Urinary tract infection, site not specified: Secondary | ICD-10-CM | POA: Diagnosis present

## 2022-08-21 DIAGNOSIS — F319 Bipolar disorder, unspecified: Secondary | ICD-10-CM | POA: Diagnosis present

## 2022-08-21 DIAGNOSIS — Z79899 Other long term (current) drug therapy: Secondary | ICD-10-CM | POA: Insufficient documentation

## 2022-08-21 DIAGNOSIS — Z1152 Encounter for screening for COVID-19: Secondary | ICD-10-CM | POA: Insufficient documentation

## 2022-08-21 DIAGNOSIS — F1410 Cocaine abuse, uncomplicated: Secondary | ICD-10-CM | POA: Diagnosis present

## 2022-08-21 DIAGNOSIS — I10 Essential (primary) hypertension: Secondary | ICD-10-CM | POA: Diagnosis not present

## 2022-08-21 DIAGNOSIS — N1831 Chronic kidney disease, stage 3a: Secondary | ICD-10-CM | POA: Diagnosis not present

## 2022-08-21 DIAGNOSIS — F32A Depression, unspecified: Secondary | ICD-10-CM | POA: Diagnosis present

## 2022-08-21 DIAGNOSIS — I129 Hypertensive chronic kidney disease with stage 1 through stage 4 chronic kidney disease, or unspecified chronic kidney disease: Secondary | ICD-10-CM | POA: Insufficient documentation

## 2022-08-21 DIAGNOSIS — F419 Anxiety disorder, unspecified: Secondary | ICD-10-CM

## 2022-08-21 LAB — CBC
HCT: 49.4 % — ABNORMAL HIGH (ref 36.0–46.0)
Hemoglobin: 16.7 g/dL — ABNORMAL HIGH (ref 12.0–15.0)
MCH: 28.9 pg (ref 26.0–34.0)
MCHC: 33.8 g/dL (ref 30.0–36.0)
MCV: 85.5 fL (ref 80.0–100.0)
Platelets: 354 10*3/uL (ref 150–400)
RBC: 5.78 MIL/uL — ABNORMAL HIGH (ref 3.87–5.11)
RDW: 12.5 % (ref 11.5–15.5)
WBC: 10.3 10*3/uL (ref 4.0–10.5)
nRBC: 0 % (ref 0.0–0.2)

## 2022-08-21 LAB — URINE DRUG SCREEN, QUALITATIVE (ARMC ONLY)
Amphetamines, Ur Screen: NOT DETECTED
Barbiturates, Ur Screen: NOT DETECTED
Benzodiazepine, Ur Scrn: NOT DETECTED
Cannabinoid 50 Ng, Ur ~~LOC~~: NOT DETECTED
Cocaine Metabolite,Ur ~~LOC~~: POSITIVE — AB
MDMA (Ecstasy)Ur Screen: NOT DETECTED
Methadone Scn, Ur: NOT DETECTED
Opiate, Ur Screen: NOT DETECTED
Phencyclidine (PCP) Ur S: NOT DETECTED
Tricyclic, Ur Screen: NOT DETECTED

## 2022-08-21 LAB — BASIC METABOLIC PANEL
Anion gap: 16 — ABNORMAL HIGH (ref 5–15)
Anion gap: 13 (ref 5–15)
Anion gap: 11 (ref 5–15)
BUN: 25 mg/dL — ABNORMAL HIGH (ref 8–23)
BUN: 27 mg/dL — ABNORMAL HIGH (ref 8–23)
BUN: 22 mg/dL (ref 8–23)
CO2: 16 mmol/L — ABNORMAL LOW (ref 22–32)
CO2: 20 mmol/L — ABNORMAL LOW (ref 22–32)
CO2: 20 mmol/L — ABNORMAL LOW (ref 22–32)
Calcium: 8.4 mg/dL — ABNORMAL LOW (ref 8.9–10.3)
Calcium: 8.7 mg/dL — ABNORMAL LOW (ref 8.9–10.3)
Calcium: 8.4 mg/dL — ABNORMAL LOW (ref 8.9–10.3)
Chloride: 94 mmol/L — ABNORMAL LOW (ref 98–111)
Chloride: 99 mmol/L (ref 98–111)
Chloride: 100 mmol/L (ref 98–111)
Creatinine, Ser: 1.23 mg/dL — ABNORMAL HIGH (ref 0.44–1.00)
Creatinine, Ser: 1.07 mg/dL — ABNORMAL HIGH (ref 0.44–1.00)
Creatinine, Ser: 0.91 mg/dL (ref 0.44–1.00)
GFR, Estimated: 50 mL/min — ABNORMAL LOW (ref >60)
GFR, Estimated: 59 mL/min — ABNORMAL LOW (ref >60)
GFR, Estimated: >60 mL/min (ref >60)
Glucose, Bld: 486 mg/dL — ABNORMAL HIGH (ref 70–99)
Glucose, Bld: 198 mg/dL — ABNORMAL HIGH (ref 70–99)
Glucose, Bld: 219 mg/dL — ABNORMAL HIGH (ref 70–99)
Potassium: 4.8 mmol/L (ref 3.5–5.1)
Potassium: 3.6 mmol/L (ref 3.5–5.1)
Potassium: 3.9 mmol/L (ref 3.5–5.1)
Sodium: 126 mmol/L — ABNORMAL LOW (ref 135–145)
Sodium: 132 mmol/L — ABNORMAL LOW (ref 135–145)
Sodium: 131 mmol/L — ABNORMAL LOW (ref 135–145)

## 2022-08-21 LAB — GLUCOSE, CAPILLARY
Glucose-Capillary: 160 mg/dL — ABNORMAL HIGH (ref 70–99)
Glucose-Capillary: 167 mg/dL — ABNORMAL HIGH (ref 70–99)
Glucose-Capillary: 171 mg/dL — ABNORMAL HIGH (ref 70–99)
Glucose-Capillary: 176 mg/dL — ABNORMAL HIGH (ref 70–99)
Glucose-Capillary: 209 mg/dL — ABNORMAL HIGH (ref 70–99)
Glucose-Capillary: 233 mg/dL — ABNORMAL HIGH (ref 70–99)
Glucose-Capillary: 261 mg/dL — ABNORMAL HIGH (ref 70–99)
Glucose-Capillary: 339 mg/dL — ABNORMAL HIGH (ref 70–99)
Glucose-Capillary: 422 mg/dL — ABNORMAL HIGH (ref 70–99)
Glucose-Capillary: 483 mg/dL — ABNORMAL HIGH (ref 70–99)

## 2022-08-21 LAB — URINALYSIS, ROUTINE W REFLEX MICROSCOPIC
Bacteria, UA: NONE SEEN
Bilirubin Urine: NEGATIVE
Glucose, UA: >=500 mg/dL — AB
Ketones, ur: 20 mg/dL — AB
Nitrite: NEGATIVE
Protein, ur: NEGATIVE mg/dL
RBC / HPF: >50 RBC/hpf (ref 0–5)
Specific Gravity, Urine: 1.027 (ref 1.005–1.030)
pH: 5 (ref 5.0–8.0)

## 2022-08-21 LAB — MRSA NEXT GEN BY PCR, NASAL: MRSA by PCR Next Gen: NOT DETECTED

## 2022-08-21 LAB — RESP PANEL BY RT-PCR (RSV, FLU A&B, COVID)  RVPGX2
Influenza A by PCR: NEGATIVE
Influenza B by PCR: NEGATIVE
Resp Syncytial Virus by PCR: NEGATIVE
SARS Coronavirus 2 by RT PCR: NEGATIVE

## 2022-08-21 LAB — LIPASE, BLOOD: Lipase: 27 U/L (ref 11–51)

## 2022-08-21 LAB — BETA-HYDROXYBUTYRIC ACID
Beta-Hydroxybutyric Acid: 6.47 mmol/L — ABNORMAL HIGH (ref 0.05–0.27)
Beta-Hydroxybutyric Acid: 1.8 mmol/L — ABNORMAL HIGH (ref 0.05–0.27)

## 2022-08-21 LAB — CBG MONITORING, ED: Glucose-Capillary: 563 mg/dL (ref 70–99)

## 2022-08-21 MED ORDER — LORAZEPAM 2 MG PO TABS
0.0000 mg | ORAL_TABLET | Freq: Four times a day (QID) | ORAL | Status: DC
  Administered 2022-08-21: 1 mg via ORAL
  Administered 2022-08-22: 2 mg via ORAL
  Filled 2022-08-21 (×2): qty 1

## 2022-08-21 MED ORDER — LISINOPRIL 5 MG PO TABS
10.0000 mg | ORAL_TABLET | Freq: Every day | ORAL | Status: DC
  Administered 2022-08-22: 10 mg via ORAL
  Filled 2022-08-21: qty 2

## 2022-08-21 MED ORDER — ONDANSETRON HCL 4 MG/2ML IJ SOLN: 4.0000 mg | Freq: Three times a day (TID) | INTRAMUSCULAR | Status: DC | PRN

## 2022-08-21 MED ORDER — DEXTROSE IN LACTATED RINGERS 5 % IV SOLN: INTRAVENOUS | Status: DC

## 2022-08-21 MED ORDER — PNEUMOCOCCAL 20-VAL CONJ VACC 0.5 ML IM SUSY: 0.5000 mL | PREFILLED_SYRINGE | INTRAMUSCULAR | Status: DC

## 2022-08-21 MED ORDER — LORAZEPAM 2 MG/ML IJ SOLN: 1.0000 mg | INTRAMUSCULAR | Status: DC | PRN

## 2022-08-21 MED ORDER — SODIUM CHLORIDE 0.9 % IV BOLUS
1000.0000 mL | Freq: Once | INTRAVENOUS | Status: AC
  Administered 2022-08-21: 1000 mL via INTRAVENOUS

## 2022-08-21 MED ORDER — ORAL CARE MOUTH RINSE: 15.0000 mL | OROMUCOSAL | Status: DC | PRN

## 2022-08-21 MED ORDER — NICOTINE 21 MG/24HR TD PT24
21.0000 mg | MEDICATED_PATCH | Freq: Every day | TRANSDERMAL | Status: DC
  Filled 2022-08-21: qty 1

## 2022-08-21 MED ORDER — ATORVASTATIN CALCIUM 20 MG PO TABS
40.0000 mg | ORAL_TABLET | Freq: Every day | ORAL | Status: DC
  Administered 2022-08-21: 40 mg via ORAL
  Filled 2022-08-21: qty 2

## 2022-08-21 MED ORDER — DM-GUAIFENESIN ER 30-600 MG PO TB12: 1.0000 | ORAL_TABLET | Freq: Two times a day (BID) | ORAL | Status: DC | PRN

## 2022-08-21 MED ORDER — ONDANSETRON HCL 4 MG/2ML IJ SOLN
4.0000 mg | Freq: Once | INTRAMUSCULAR | Status: AC
Start: 2022-08-21 — End: 2022-08-21
  Administered 2022-08-21: 4 mg via INTRAVENOUS
  Filled 2022-08-21: qty 2

## 2022-08-21 MED ORDER — POTASSIUM CHLORIDE 10 MEQ/100ML IV SOLN
10.0000 meq | INTRAVENOUS | Status: AC
  Administered 2022-08-21 (×2): 10 meq via INTRAVENOUS
  Filled 2022-08-21 (×2): qty 100

## 2022-08-21 MED ORDER — GABAPENTIN 300 MG PO CAPS
300.0000 mg | ORAL_CAPSULE | Freq: Two times a day (BID) | ORAL | Status: DC
  Administered 2022-08-21 – 2022-08-22 (×2): 300 mg via ORAL
  Filled 2022-08-21 (×2): qty 1

## 2022-08-21 MED ORDER — ALBUTEROL SULFATE (2.5 MG/3ML) 0.083% IN NEBU: 3.0000 mL | INHALATION_SOLUTION | RESPIRATORY_TRACT | Status: DC | PRN

## 2022-08-21 MED ORDER — LACTATED RINGERS IV SOLN: INTRAVENOUS | Status: DC

## 2022-08-21 MED ORDER — SODIUM CHLORIDE 0.9 % IV SOLN: INTRAVENOUS | Status: DC

## 2022-08-21 MED ORDER — CHLORHEXIDINE GLUCONATE CLOTH 2 % EX PADS
6.0000 | MEDICATED_PAD | Freq: Every day | CUTANEOUS | Status: DC
  Administered 2022-08-22: 6 via TOPICAL

## 2022-08-21 MED ORDER — DEXTROSE 50 % IV SOLN: 0.0000 mL | INTRAVENOUS | Status: DC | PRN

## 2022-08-21 MED ORDER — HYDROXYZINE HCL 50 MG PO TABS
50.0000 mg | ORAL_TABLET | Freq: Two times a day (BID) | ORAL | Status: DC | PRN
  Administered 2022-08-21: 50 mg via ORAL
  Filled 2022-08-21 (×2): qty 1

## 2022-08-21 MED ORDER — HYDRALAZINE HCL 20 MG/ML IJ SOLN: 5.0000 mg | INTRAMUSCULAR | Status: DC | PRN

## 2022-08-21 MED ORDER — INSULIN REGULAR(HUMAN) IN NACL 100-0.9 UT/100ML-% IV SOLN: INTRAVENOUS | Status: DC

## 2022-08-21 MED ORDER — IOHEXOL 300 MG/ML  SOLN
75.0000 mL | Freq: Once | INTRAMUSCULAR | Status: AC | PRN
  Administered 2022-08-21: 100 mL via INTRAVENOUS

## 2022-08-21 MED ORDER — DILTIAZEM HCL ER 120 MG PO TB24
120.0000 mg | ORAL_TABLET | Freq: Every day | ORAL | Status: DC
  Administered 2022-08-22: 120 mg via ORAL
  Filled 2022-08-21: qty 1

## 2022-08-21 MED ORDER — PANTOPRAZOLE SODIUM 40 MG PO TBEC
40.0000 mg | DELAYED_RELEASE_TABLET | Freq: Every day | ORAL | Status: DC
  Administered 2022-08-22: 40 mg via ORAL
  Filled 2022-08-21: qty 1

## 2022-08-21 MED ORDER — ACETAMINOPHEN 325 MG PO TABS
650.0000 mg | ORAL_TABLET | Freq: Four times a day (QID) | ORAL | Status: DC | PRN
  Administered 2022-08-22: 650 mg via ORAL
  Filled 2022-08-21: qty 2

## 2022-08-21 MED ORDER — INFLUENZA VAC SPLIT QUAD 0.5 ML IM SUSY: 0.5000 mL | PREFILLED_SYRINGE | INTRAMUSCULAR | Status: DC

## 2022-08-21 MED ORDER — ADULT MULTIVITAMIN W/MINERALS CH
1.0000 | ORAL_TABLET | Freq: Every day | ORAL | Status: DC
  Administered 2022-08-22: 1 via ORAL
  Filled 2022-08-21: qty 1

## 2022-08-21 MED ORDER — THIAMINE MONONITRATE 100 MG PO TABS
100.0000 mg | ORAL_TABLET | Freq: Every day | ORAL | Status: DC
  Administered 2022-08-21 – 2022-08-22 (×2): 100 mg via ORAL
  Filled 2022-08-21 (×2): qty 1

## 2022-08-21 MED ORDER — FOLIC ACID 1 MG PO TABS
1.0000 mg | ORAL_TABLET | Freq: Every day | ORAL | Status: DC
  Administered 2022-08-22: 1 mg via ORAL
  Filled 2022-08-21: qty 1

## 2022-08-21 MED ORDER — BUPROPION HCL ER (SR) 150 MG PO TB12
150.0000 mg | ORAL_TABLET | Freq: Two times a day (BID) | ORAL | Status: DC
  Administered 2022-08-21 – 2022-08-22 (×2): 150 mg via ORAL
  Filled 2022-08-21 (×2): qty 1

## 2022-08-21 MED ORDER — ENOXAPARIN SODIUM 40 MG/0.4ML IJ SOSY
40.0000 mg | PREFILLED_SYRINGE | INTRAMUSCULAR | Status: DC
  Administered 2022-08-21: 40 mg via SUBCUTANEOUS
  Filled 2022-08-21: qty 0.4

## 2022-08-21 MED ORDER — INSULIN REGULAR(HUMAN) IN NACL 100-0.9 UT/100ML-% IV SOLN
INTRAVENOUS | Status: DC
  Administered 2022-08-21: 11 [IU]/h via INTRAVENOUS
  Administered 2022-08-22: 3.2 [IU]/h via INTRAVENOUS
  Filled 2022-08-21: qty 100

## 2022-08-21 MED ORDER — THIAMINE HCL 100 MG/ML IJ SOLN: 100.0000 mg | Freq: Every day | INTRAMUSCULAR | Status: DC

## 2022-08-21 MED ORDER — LORAZEPAM 2 MG PO TABS: 0.0000 mg | ORAL_TABLET | Freq: Two times a day (BID) | ORAL | Status: DC

## 2022-08-21 MED ORDER — LORAZEPAM 1 MG PO TABS: 1.0000 mg | ORAL_TABLET | ORAL | Status: DC | PRN

## 2022-08-21 MED ORDER — SODIUM CHLORIDE 0.9 % IV SOLN
1.0000 g | INTRAVENOUS | Status: DC
  Administered 2022-08-21 – 2022-08-22 (×2): 1 g via INTRAVENOUS
  Filled 2022-08-21: qty 1
  Filled 2022-08-21: qty 10

## 2022-08-21 MED ORDER — POTASSIUM CHLORIDE CRYS ER 20 MEQ PO TBCR
40.0000 meq | EXTENDED_RELEASE_TABLET | Freq: Once | ORAL | Status: AC
  Administered 2022-08-21: 40 meq via ORAL
  Filled 2022-08-21: qty 2

## 2022-08-21 MED ORDER — VITAMIN B-12 1000 MCG PO TABS
500.0000 ug | ORAL_TABLET | Freq: Every day | ORAL | Status: DC
  Administered 2022-08-21 – 2022-08-22 (×2): 500 ug via ORAL
  Filled 2022-08-21 (×2): qty 1

## 2022-08-21 MED ORDER — LACTATED RINGERS IV BOLUS
1000.0000 mL | Freq: Once | INTRAVENOUS | Status: AC
  Administered 2022-08-21: 1000 mL via INTRAVENOUS

## 2022-08-21 MED ORDER — LORATADINE 10 MG PO TABS: 10.0000 mg | ORAL_TABLET | Freq: Every day | ORAL | Status: DC | PRN

## 2022-08-21 NOTE — ED Provider Notes (Signed)
[facility] Provider Note    Event Date/Time   First MD Initiated Contact with Patient 08/21/22 [phone]     (approximate)   History   Chief Complaint: Hyperglycemia   HPI  Pamela Lane is a 62 y.o. female with a history of hypertension diabetes who comes ED complaining of elevated blood sugar for the past 3 weeks associated with intermittent vomiting.  Also having worse nausea vomiting diarrhea today along with cough and congestion and bodyaches.  No chest pain or shortness of breath.  No headache.  No black or bloody stool.     Physical Exam   Triage Vital Signs: ED Triage Vitals  Enc Vitals Group     BP 08/21/22 0711 109/73     Pulse Rate 08/21/22 0711 (!) 124     Resp 08/21/22 0711 18     Temp 08/21/22 0711 (!) 97.5 F (36.4 C)     Temp Source 08/21/22 0711 Oral     SpO2 08/21/22 0711 100 %     Weight --      Height --      Head Circumference --      Peak Flow --      Pain Score 08/21/22 0709 5     Pain Loc --      Pain Edu? --      Excl. in [place]? --     Most recent vital signs: Vitals:   08/21/22 1100 08/21/22 1130  BP: (!) 160/100 (!) 177/89  Pulse:  96  Resp: 19   Temp:    SpO2:  99%    General: Awake, no distress.  CV:  Good peripheral perfusion.  Tachycardia heart rate 120 Resp:  Normal effort.  Clear to auscultation bilaterally Abd:  No distention.  Soft with right upper quadrant and right lower quadrant tenderness Other:  Dry mucous membranes   ED Results / Procedures / Treatments   Labs (all labs ordered are listed, but only abnormal results are displayed) Labs Reviewed  CBC - Abnormal; Notable for the following components:      Result Value   RBC 5.78 (*)    Hemoglobin 16.7 (*)    HCT 49.4 (*)    All other components within normal limits  URINALYSIS, ROUTINE W REFLEX MICROSCOPIC - Abnormal; Notable for the following components:   Color, Urine STRAW (*)    APPearance HAZY (*)    Glucose, UA >=500 (*)    Hgb  urine dipstick SMALL (*)    Ketones, ur 20 (*)    Leukocytes,Ua LARGE (*)    All other components within normal limits  BETA-HYDROXYBUTYRIC ACID - Abnormal; Notable for the following components:   Beta-Hydroxybutyric Acid 6.47 (*)    All other components within normal limits  COMPREHENSIVE METABOLIC PANEL - Abnormal; Notable for the following components:   Sodium 128 (*)    Chloride 91 (*)    CO2 16 (*)    Glucose, Bld 522 (*)    BUN 32 (*)    Creatinine, Ser 1.41 (*)    Calcium 8.8 (*)    Total Protein 8.5 (*)    AST 14 (*)    Total Bilirubin 1.6 (*)    GFR, Estimated 42 (*)    Anion gap 21 (*)    All other components within normal limits  CBG MONITORING, ED - Abnormal; Notable for the following components:   Glucose-Capillary 563 (*)    All other components within normal limits  RESP PANEL BY RT-PCR (RSV, FLU A&B, COVID)  [id number]  LIPASE, BLOOD     EKG    RADIOLOGY CT abdomen pelvis interpreted by me, negative for bowel obstruction or free air.  Radiology report reviewed.   PROCEDURES:  .Critical Care  Performed by: Carrie Mew, MD Authorized by: Carrie Mew, MD   Critical care provider statement:    Critical care time (minutes):  35   Critical care time was exclusive of:  Separately billable procedures and treating other patients   Critical care was necessary to treat or prevent imminent or life-threatening deterioration of the following conditions:  Dehydration, endocrine crisis and metabolic crisis   Critical care was time spent personally by me on the following activities:  Development of treatment plan with patient or surrogate, discussions with consultants, evaluation of patient's response to treatment, examination of patient, obtaining history from patient or surrogate, ordering and performing treatments and interventions, ordering and review of laboratory studies, ordering and review of radiographic studies, pulse oximetry, re-evaluation of  patient's condition and review of old charts   Care discussed with: admitting provider      MEDICATIONS ORDERED IN ED: Medications  insulin regular, human (MYXREDLIN) 100 units/ 100 mL infusion (has no administration in time range)  lactated ringers infusion (has no administration in time range)  dextrose 5 % in lactated ringers infusion (has no administration in time range)  dextrose 50 % solution 0-50 mL (has no administration in time range)  potassium chloride 10 mEq in 100 mL IVPB (has no administration in time range)  ondansetron (ZOFRAN) injection 4 mg (has no administration in time range)  acetaminophen (TYLENOL) tablet 650 mg (has no administration in time range)  hydrALAZINE (APRESOLINE) injection 5 mg (has no administration in time range)  sodium chloride 0.9 % bolus 1,000 mL (0 mLs Intravenous Stopped 08/21/22 1136)  ondansetron (ZOFRAN) injection 4 mg (4 mg Intravenous Given 08/21/22 0923)  iohexol (OMNIPAQUE) 300 MG/ML solution 75 mL (100 mLs Intravenous Contrast Given 08/21/22 1111)  lactated ringers bolus 1,000 mL (1,000 mLs Intravenous New Bag/Given 08/21/22 1309)     IMPRESSION / MDM / [place] / ED COURSE  I reviewed the triage vital signs and the nursing notes.  DDx: Metabolic acidosis/DKA, dehydration, electrolyte abnormality, anemia, viral illness, appendicitis, biliary disease, diverticulitis  Patient's presentation is most consistent with acute presentation with potential threat to life or bodily function.  Patient presents with tachycardia, clinically dehydrated appearing, blood sugar greater than 563.  Will obtain labs, give IV fluids for hydration.  Will obtain CT abdomen pelvis due to her right-sided pain and tenderness, prolonged GI symptoms, insulin-dependent diabetes..   Clinical Course as of 08/21/22 1312  Sat Aug 21, 2022  1255 Labs consistent with DKA.  Heart rate improved with IV fluids.  Will continue hydration, start insulin infusion and  plan to admit. [PS]    Clinical Course User Index [PS] Carrie Mew, MD    ----------------------------------------- 1:12 PM on 08/21/2022 ----------------------------------------- Case discussed with hospitalist   FINAL CLINICAL IMPRESSION(S) / ED DIAGNOSES   Final diagnoses:  Type 2 diabetes mellitus with ketoacidosis without coma, with long-term current use of insulin ([place])     Rx / DC Orders   ED Discharge Orders     None        Note:  This document was prepared using Dragon voice recognition software and may include unintentional dictation errors.   Carrie Mew, MD 08/21/22 [phone]

## 2022-08-21 NOTE — Progress Notes (Signed)
Inpatient Diabetes Program Recommendations  AACE/ADA: New Consensus Statement on Inpatient Glycemic Control (2015)  Target Ranges:  Prepandial:   less than 140 mg/dL      Peak postprandial:   less than 180 mg/dL (1-2 hours)      Critically ill patients:  140 - 180 mg/dL   Lab Results  Component Value Date   GLUCAP 422 (H) 08/21/2022   HGBA1C 13.0 (H) 03/14/2022    Review of Glycemic Control  Latest Reference Range & Units 08/21/22 07:13 08/21/22 14:54 08/21/22 15:48  Glucose-Capillary 70 - 99 mg/dL 563 (HH) 483 (H) 422 (H)    Latest Reference Range & Units 08/21/22 14:52  Sodium 135 - 145 mmol/L 126 (L)  Potassium 3.5 - 5.1 mmol/L 4.8  Chloride 98 - 111 mmol/L 94 (L)  CO2 22 - 32 mmol/L 16 (L)  Glucose 70 - 99 mg/dL 486 (H)  BUN 8 - 23 mg/dL 25 (H)  Creatinine 0.44 - 1.00 mg/dL 1.23 (H)  Calcium 8.9 - 10.3 mg/dL 8.4 (L)  Anion gap 5 - 15  16 (H)  (L): Data is abnormally low (H): Data is abnormally high Diabetes history: DM 2 Outpatient Diabetes medications:  Lantus 25 units bid Januvia 100 mg daily Novolog 15 units tid with meals Xigduo 04-999 mg daily Current orders for Inpatient glycemic control:  IV insulin- DKA orderset  Inpatient Diabetes Program Recommendations:    Note last admit for DKA 03/13/2022.  Patient seen by DM coordinator at that time.  Agree with current orders.  Unsure of cause of DKA.  Will follow.   Thanks,  Adah Perl, RN, BC-ADM Inpatient Diabetes Coordinator Pager [phone]  (8a-5p)

## 2022-08-21 NOTE — H&P (Signed)
History and Physical    Pamela Lane [id number] DOB: 1960-09-26 DOA: 08/21/2022  Referring MD/NP/PA:   PCP: [facility]   Patient coming from:  The patient is coming from home.      Chief Complaint: Nausea, vomiting, diarrhea, dysuria and elevated blood sugar  HPI: Pamela Lane is a 62 y.o. female with medical history significant of diabetes mellitus, DKA, hypertension, hyperlipidemia, asthma, depression with anxiety, bipolar, CKD-3A, SVT, C. difficile, who presents with nausea, vomiting, diarrhea, dysuria and elevated blood sugar.  Patient states that she has not been feeling well recently.  She states that her blood sugar has been elevated in the past several weeks.  She states that she is using Lantus, but she seems to be confused about the dosage.  She said sometimes she uses 25 units, sometimes 36 units.  She also reports nausea, vomiting, diarrhea in the past several days.  She states that her diarrhea has resolved.  She has few times of nonbilious nonbloody vomiting each day.  She has mild lower abdominal pain intermittently.  She reports dysuria, increased urinary frequency, no hematuria or burning on urination.  Denies fever or chills.  Patient has has mild dry cough, no chest pain or shortness breath.  Data reviewed independently and ED Course: pt was found to have DKA (blood sugar 563, AG 21, bicarbonate 16, beta hydroxybutyric acid 6.07, ketone 20 in UA), WBC 10.3, negative COVID PCR for flu, COVID and RSV.  Positive urinalysis (hazy appearance, large amount of leukocyte, negative bacteria, WBC 21-50).  Temperature 97.5, blood pressure 177/89, heart rate 124, 96, RR 24, oxygen saturation 100% on room air.  Patient is placed on stepdown for observation.  CT abdomen/pelvis: 1. No acute abdominal or pelvic pathology. 2. Air in the bladder which may be secondary to recent instrumentation versus cystitis. 3.  Aortic Atherosclerosis  (ICD10-I70.0).  EKG: Not done in ED, will get one.      Review of Systems:   General: no fevers, chills, no body weight gain, has poor appetite HEENT: no blurry vision, hearing changes or sore throat Respiratory: no dyspnea, has dry coughing, no wheezing CV: no chest pain, no palpitations GI: has nausea, vomiting, abdominal pain, diarrhea, no constipation GU: has dysuria, increased urinary frequency, no hematuria , burning on urination,  Ext: no leg edema Neuro: no unilateral weakness, numbness, or tingling, no vision change or hearing loss Skin: no rash, no skin tear. MSK: No muscle spasm, no deformity, no limitation of range of movement in spin Heme: No easy bruising.  Travel history: No recent long distant travel.   Allergy: No Known Allergies  Past Medical History:  Diagnosis Date   Asthma    Clostridium difficile colitis    Diabetes mellitus without complication ([place])    Hypertension    Mental disorder    pt reports 'I have all of them mental disorders'    Past Surgical History:  Procedure Laterality Date   ABDOMINAL HYSTERECTOMY      Social History:  reports that she has been smoking cigarettes. She has been smoking an average of .25 packs per day. She has never used smokeless tobacco. She reports current alcohol use of about 40.0 standard drinks of alcohol per week. She reports current drug use. Drugs: Marijuana and Cocaine.  Family History:  Family History  Problem Relation Age of Onset   Hypertension Mother      Prior to Admission medications   Medication Sig Start Date End Date Taking?  Authorizing Provider  ALLERGY RELIEF/INDOOR/OUTDOOR 10 MG tablet Take 10 mg by mouth daily. 12/17/21   [provider]  atorvastatin (LIPITOR) 40 MG tablet TAKE 1 TABLET BY MOUTH AT BEDTIME. Patient taking differently: Take 40 mg by mouth at bedtime. 08/29/20 03/13/22  Kayleen Memos, DO  buPROPion (WELLBUTRIN SR) 150 MG 12 hr tablet Take 150 mg by mouth 2 (two) times  daily. 10/01/20   [provider]  DILT-XR 120 MG 24 hr capsule Take 120 mg by mouth daily. 12/17/21   [provider]  folic acid (FOLVITE) 1 MG tablet Take 1 mg by mouth daily. 12/17/21   [provider]  gabapentin (NEURONTIN) 300 MG capsule Take 300 mg by mouth 2 (two) times daily. 12/17/21   [provider]  hydrOXYzine (VISTARIL) 50 MG capsule Take 50 mg by mouth 2 (two) times daily as needed. 12/17/21   [provider]  insulin glargine (LANTUS) 100 UNIT/ML Solostar Pen Inject 36 Units into the skin daily. Patient taking differently: Inject 25 Units into the skin 2 (two) times daily. 11/18/20 02/01/21  Sreenath, Trula Slade, MD  JANUVIA 100 MG tablet Take 100 mg by mouth daily. 12/17/21   [provider]  lisinopril (ZESTRIL) 10 MG tablet Take 10 mg by mouth daily. 10/01/20   [provider]  Multiple Vitamins-Minerals (DAILY MULTIVITAMIN) CAPS Take 1 tablet by mouth daily. 12/17/21   [provider]  nicotine (NICODERM CQ - DOSED IN MG/24 HOURS) 21 mg/24hr patch Place 1 patch (21 mg total) onto the skin daily. 11/18/20   Sreenath, Sudheer B, MD  NOVOLOG FLEXPEN 100 UNIT/ML FlexPen Inject 15 Units into the skin 3 (three) times daily with meals. 03/15/22 03/15/23  Jennye Boroughs, MD  omeprazole (PRILOSEC) 20 MG capsule Take 20 mg by mouth daily.    [provider]  vitamin B-12 (CYANOCOBALAMIN) 500 MCG tablet Take 500 mcg by mouth daily.    [provider]  XIGDUO XR 04-999 MG TB24 Take 1 tablet by mouth daily. 12/17/21   [provider]    Physical Exam: Vitals:   08/21/22 0930 08/21/22 1030 08/21/22 1100 08/21/22 1130  BP: (!) 150/103 (!) 168/103 (!) 160/100 (!) 177/89  Pulse: (!) 102 (!) 102  96  Resp: (!) 24 15 19   $ Temp:      TempSrc:      SpO2: 100% 100%  99%   General: Not in acute distress HEENT:       Eyes: PERRL, EOMI, no scleral icterus.       ENT: No discharge from the ears and nose, no pharynx  injection, no tonsillar enlargement.        Neck: No JVD, no bruit, no mass felt. Heme: No neck lymph node enlargement. Cardiac: S1/S2, RRR, No murmurs, No gallops or rubs. Respiratory: No rales, wheezing, rhonchi or rubs. GI: Soft, nondistended, nontender, no rebound pain, no organomegaly, BS present. GU: No hematuria Ext: No pitting leg edema bilaterally. 1+DP/PT pulse bilaterally. Musculoskeletal: No joint deformities, No joint redness or warmth, no limitation of ROM in spin. Skin: No rashes.  Neuro: Alert, oriented X3, cranial nerves II-XII grossly intact, moves all extremities normally.  Psych: Patient is not psychotic, no suicidal or hemocidal ideation.  Labs on Admission: I have personally reviewed following labs and imaging studies  CBC: Recent Labs  Lab 08/21/22 0811  WBC 10.3  HGB 16.7*  HCT 49.4*  MCV 85.5  PLT [id number]   Basic Metabolic Panel: Recent Labs  Lab 08/21/22 0830  NA 128*  K 4.8  CL 91*  CO2 16*  GLUCOSE 522*  BUN 32*  CREATININE 1.41*  CALCIUM 8.8*   GFR: CrCl cannot be calculated (Unknown ideal weight.). Liver Function Tests: Recent Labs  Lab 08/21/22 0830  AST 14*  ALT 17  ALKPHOS 105  BILITOT 1.6*  PROT 8.5*  ALBUMIN 4.4   Recent Labs  Lab 08/21/22 0830  LIPASE 27   No results for input(s): "AMMONIA" in the last 168 hours. Coagulation Profile: No results for input(s): "INR", "PROTIME" in the last 168 hours. Cardiac Enzymes: No results for input(s): "CKTOTAL", "CKMB", "CKMBINDEX", "TROPONINI" in the last 168 hours. BNP (last 3 results) No results for input(s): "PROBNP" in the last 8760 hours. HbA1C: No results for input(s): "HGBA1C" in the last 72 hours. CBG: Recent Labs  Lab 08/21/22 0713  GLUCAP 563*   Lipid Profile: No results for input(s): "CHOL", "HDL", "LDLCALC", "TRIG", "CHOLHDL", "LDLDIRECT" in the last 72 hours. Thyroid Function Tests: No results for input(s): "TSH", "T4TOTAL", "FREET4", "T3FREE", "THYROIDAB" in  the last 72 hours. Anemia Panel: No results for input(s): "VITAMINB12", "FOLATE", "FERRITIN", "TIBC", "IRON", "RETICCTPCT" in the last 72 hours. Urine analysis:    Component Value Date/Time   COLORURINE STRAW (A) 08/21/2022 0736   APPEARANCEUR HAZY (A) 08/21/2022 0736   LABSPEC 1.027 08/21/2022 0736   PHURINE 5.0 08/21/2022 0736   GLUCOSEU >=500 (A) 08/21/2022 0736   HGBUR SMALL (A) 08/21/2022 0736   BILIRUBINUR NEGATIVE 08/21/2022 0736   KETONESUR 20 (A) 08/21/2022 0736   PROTEINUR NEGATIVE 08/21/2022 0736   NITRITE NEGATIVE 08/21/2022 0736   LEUKOCYTESUR LARGE (A) 08/21/2022 0736   Sepsis Labs: @LABRCNTIP$ (procalcitonin:4,lacticidven:4) ) Recent Results (from the past 240 hour(s))  Resp panel by RT-PCR (RSV, Flu A&B, Covid) Urine, Clean Catch     Status: None   Collection Time: 08/21/22  7:36 AM   Specimen: Urine, Clean Catch; Nasal Swab  Result Value Ref Range Status   SARS Coronavirus 2 by RT PCR NEGATIVE NEGATIVE Final    Comment: (NOTE) SARS-CoV-2 target nucleic acids are NOT DETECTED.  The SARS-CoV-2 RNA is generally detectable in upper respiratory specimens during the acute phase of infection. The lowest concentration of SARS-CoV-2 viral copies this assay can detect is 138 copies/mL. A negative result does not preclude SARS-Cov-2 infection and should not be used as the sole basis for treatment or other patient management decisions. A negative result may occur with  improper specimen collection/handling, submission of specimen other than nasopharyngeal swab, presence of viral mutation(s) within the areas targeted by this assay, and inadequate number of viral copies(<138 copies/mL). A negative result must be combined with clinical observations, patient history, and epidemiological information. The expected result is Negative.  Fact Sheet for Patients:  EntrepreneurPulse.com.au  Fact Sheet for Healthcare Providers:   IncredibleEmployment.be  This test is no t yet approved or cleared by the [place] FDA and  has been authorized for detection and/or diagnosis of SARS-CoV-2 by FDA under an Emergency Use Authorization (EUA). This EUA will remain  in effect (meaning this test can be used) for the duration of the COVID-19 declaration under Section 564(b)(1) of the Act, 21 U.S.C.section 360bbb-3(b)(1), unless the authorization is terminated  or revoked sooner.       Influenza A by PCR NEGATIVE NEGATIVE Final   Influenza B by PCR NEGATIVE NEGATIVE Final    Comment: (NOTE) The Xpert Xpress SARS-CoV-2/FLU/RSV plus assay is intended as an aid in the diagnosis of influenza  from Nasopharyngeal swab specimens and should not be used as a sole basis for treatment. Nasal washings and aspirates are unacceptable for Xpert Xpress SARS-CoV-2/FLU/RSV testing.  Fact Sheet for Patients: EntrepreneurPulse.com.au  Fact Sheet for Healthcare Providers: IncredibleEmployment.be  This test is not yet approved or cleared by the [place] FDA and has been authorized for detection and/or diagnosis of SARS-CoV-2 by FDA under an Emergency Use Authorization (EUA). This EUA will remain in effect (meaning this test can be used) for the duration of the COVID-19 declaration under Section 564(b)(1) of the Act, 21 U.S.C. section 360bbb-3(b)(1), unless the authorization is terminated or revoked.     Resp Syncytial Virus by PCR NEGATIVE NEGATIVE Final    Comment: (NOTE) Fact Sheet for Patients: EntrepreneurPulse.com.au  Fact Sheet for Healthcare Providers: IncredibleEmployment.be  This test is not yet approved or cleared by the [place] FDA and has been authorized for detection and/or diagnosis of SARS-CoV-2 by FDA under an Emergency Use Authorization (EUA). This EUA will remain in effect (meaning this test can be used) for  the duration of the COVID-19 declaration under Section 564(b)(1) of the Act, 21 U.S.C. section 360bbb-3(b)(1), unless the authorization is terminated or revoked.  Performed at [facility], [address] [place], [place] [address]      Radiological Exams on Admission: CT ABDOMEN PELVIS W CONTRAST  Result Date: 08/21/2022 CLINICAL DATA:  Abdominal pain, vomiting, diarrhea EXAM: CT ABDOMEN AND PELVIS WITH CONTRAST TECHNIQUE: Multidetector CT imaging of the abdomen and pelvis was performed using the standard protocol following bolus administration of intravenous contrast. RADIATION DOSE REDUCTION: This exam was performed according to the departmental dose-optimization program which includes automated exposure control, adjustment of the mA and/or kV according to patient size and/or use of iterative reconstruction technique. CONTRAST:  159m OMNIPAQUE IOHEXOL 300 MG/ML  SOLN COMPARISON:  None Available. FINDINGS: Lower chest: No acute abnormality. Hepatobiliary: No focal liver abnormality is seen. No gallstones, gallbladder wall thickening, or biliary dilatation. Pancreas: Unremarkable. No pancreatic ductal dilatation or surrounding inflammatory changes. Spleen: Normal in size without focal abnormality. Adrenals/Urinary Tract: Adrenal glands are unremarkable. Kidneys are normal, without renal calculi, focal lesion, or hydronephrosis. Air in the bladder which may be secondary to recent instrumentation versus cystitis. Stomach/Bowel: Stomach is within normal limits. No evidence of bowel wall thickening, distention, or inflammatory changes. Appendix is normal. Vascular/Lymphatic: Normal caliber abdominal aorta with mild atherosclerosis. No lymphadenopathy. Reproductive: Status post hysterectomy. No adnexal masses. Other: No abdominal wall hernia or abnormality. No abdominopelvic ascites. Degenerative disease with disc height loss at L5-S1. Bilateral facet arthropathy throughout the lumbar spine.  Moderate osteoarthritis of the right hip. Mild osteoarthritis of bilateral SI joints. Musculoskeletal: No acute osseous abnormality. No aggressive osseous lesion. IMPRESSION: 1. No acute abdominal or pelvic pathology. 2. Air in the bladder which may be secondary to recent instrumentation versus cystitis. 3.  Aortic Atherosclerosis (ICD10-I70.0). Electronically Signed   By: HKathreen DevoidM.D.   On: 08/21/2022 11:28      Assessment/Plan Principal Problem:   DKA (diabetic ketoacidosis) ([place] Active Problems:   Type II diabetes mellitus with renal manifestations (H[facility])   Essential hypertension   HLD (hyperlipidemia)   SVT (supraventricular tachycardia)   Tobacco abuse   Cocaine abuse (HCC)   Alcohol abuse   Polysubstance abuse (HCC)   Bipolar disorder (HCC)   Anxiety and depression   Asthma   Nausea, vomiting and diarrhea   Chronic kidney disease, stage 3a ([place]   UTI (urinary tract infection)  Assessment and Plan:   DKA (diabetic ketoacidosis) ([place]): Blood sugar 563, AG 21, bicarbonate 16, beta hydroxybutyric acid 6.07, ketone 20 in UA.  - Place in SDU for obs - IVF:  2L of IVF  bolus - start DKA protocol with BMP q4h - IVF: LR at 125 cc/h, will switch to D5-LR at 125 cc/h when CBG<250 - replete K as needed - Zofran prn nausea  - NPO  - consult to diabetic educator  Type II diabetes mellitus with renal manifestations [facility]): Recent A1c 13.0, very poorly controlled.  Patient is supposed to take Januvia, Xigduo, Lantus 25 units twice daily, but patient seem to be confused about Lantus dosage.  Now has DKA -On DKA protocol  Essential hypertension -IV hydralazine as needed -DIlt-XR, lisinopril  HLD (hyperlipidemia) -Lipitor  SVT (supraventricular tachycardia): HR 124 --> 96 -Dilt-XR  Polysubstance abuse, tobacco abuse, cocaine abuse, alcohol abuse and marijuana: -Did counseling about importance of quitting substance -Nicotine patch -CIWA protocol -UDS  Bipolar disorder  and Anxiety and depression -Continue home medications  Asthma: Stable -Bronchodilators  Nausea, vomiting and diarrhea: Diarrhea has resolved.  Nausea vomiting may be due to UTI.  CT scan of abdomen/pelvis negative for acute intra-abdominal issues. -As needed Zofran -IV fluid as above  Chronic kidney disease, stage 3a ([place]): Her baseline creatinine has been varying, 0.9-1.6 recently.  Today her creatinine is 1.41, BUN 32, GFR 43 -Follow-up with BMP  UTI (urinary tract infection) - IV Rocephin -Follow-up urine culture      DVT ppx:  SQ Lovenox  Code Status: Full code  Family Communication:   Yes, patient's husband by phone  Disposition Plan:  Anticipate discharge back to previous environment  Consults called:  none  Admission status and Level of care: Stepdown:     for obs   Dispo: The patient is from: Home              Anticipated d/c is to: Home              Anticipated d/c date is: 1 day              Patient currently is not medically stable to d/c.    Severity of Illness:  The appropriate patient status for this patient is INPATIENT. Inpatient status is judged to be reasonable and necessary in order to provide the required intensity of service to ensure the patient's safety. The patient's presenting symptoms, physical exam findings, and initial radiographic and laboratory data in the context of their chronic comorbidities is felt to place them at high risk for further clinical deterioration. Furthermore, it is not anticipated that the patient will be medically stable for discharge from the hospital within 2 midnights of admission.   * I certify that at the point of admission it is my clinical judgment that the patient will require inpatient hospital care spanning beyond 2 midnights from the point of admission due to high intensity of service, high risk for further deterioration and high frequency of surveillance required.*       Date of Service 08/21/2022     Ivor Costa [facility]   If 7PM-7AM, please contact night-coverage [email or web address] 08/21/2022, 1:48 PM

## 2022-08-21 NOTE — ED Notes (Signed)
Advised nurse that patient has ready bed 

## 2022-08-21 NOTE — ED Notes (Signed)
Two Rns and Les Paramedic looked for IV, and attempt with [place] machine to look for IV access with no success. Will consult IV team at this time.

## 2022-08-21 NOTE — ED Notes (Signed)
Pt called out inquiring about food, EDP made aware.

## 2022-08-21 NOTE — ED Triage Notes (Signed)
Pt to ED via POV from home. Pt reports she has been dealing with sugar issues x3 wks. Pt states they have been changing insulin dose. Pt reports N/V/D, ear pain, cough and congestion.

## 2022-08-21 NOTE — Plan of Care (Signed)
Continuing with plan of care. 

## 2022-08-21 NOTE — Progress Notes (Signed)
Patient only has one IV access and is difficult stick with no visible viable veins, IV consult was placed, IV team nurse also attempted to place PIV and was unsuccessful.  Dr. Blaine Hamper aware and ordered to change LR to NS for fluid administration as LR is incompatible with insulin.

## 2022-08-22 DIAGNOSIS — F3130 Bipolar disorder, current episode depressed, mild or moderate severity, unspecified: Secondary | ICD-10-CM

## 2022-08-22 DIAGNOSIS — N1831 Chronic kidney disease, stage 3a: Secondary | ICD-10-CM | POA: Diagnosis not present

## 2022-08-22 DIAGNOSIS — E1122 Type 2 diabetes mellitus with diabetic chronic kidney disease: Secondary | ICD-10-CM

## 2022-08-22 DIAGNOSIS — R112 Nausea with vomiting, unspecified: Secondary | ICD-10-CM | POA: Diagnosis not present

## 2022-08-22 DIAGNOSIS — Z794 Long term (current) use of insulin: Secondary | ICD-10-CM

## 2022-08-22 LAB — BASIC METABOLIC PANEL
Anion gap: 7 (ref 5–15)
Anion gap: 6 (ref 5–15)
BUN: 18 mg/dL (ref 8–23)
BUN: 16 mg/dL (ref 8–23)
CO2: 22 mmol/L (ref 22–32)
CO2: 22 mmol/L (ref 22–32)
Calcium: 8.2 mg/dL — ABNORMAL LOW (ref 8.9–10.3)
Calcium: 8.6 mg/dL — ABNORMAL LOW (ref 8.9–10.3)
Chloride: 106 mmol/L (ref 98–111)
Chloride: 105 mmol/L (ref 98–111)
Creatinine, Ser: 0.8 mg/dL (ref 0.44–1.00)
Creatinine, Ser: 0.83 mg/dL (ref 0.44–1.00)
GFR, Estimated: >60 mL/min (ref >60)
GFR, Estimated: >60 mL/min (ref >60)
Glucose, Bld: 142 mg/dL — ABNORMAL HIGH (ref 70–99)
Glucose, Bld: 129 mg/dL — ABNORMAL HIGH (ref 70–99)
Potassium: 3.5 mmol/L (ref 3.5–5.1)
Potassium: 3.6 mmol/L (ref 3.5–5.1)
Sodium: 135 mmol/L (ref 135–145)
Sodium: 133 mmol/L — ABNORMAL LOW (ref 135–145)

## 2022-08-22 LAB — GLUCOSE, CAPILLARY
Glucose-Capillary: 135 mg/dL — ABNORMAL HIGH (ref 70–99)
Glucose-Capillary: 149 mg/dL — ABNORMAL HIGH (ref 70–99)
Glucose-Capillary: 153 mg/dL — ABNORMAL HIGH (ref 70–99)
Glucose-Capillary: 158 mg/dL — ABNORMAL HIGH (ref 70–99)
Glucose-Capillary: 159 mg/dL — ABNORMAL HIGH (ref 70–99)
Glucose-Capillary: 166 mg/dL — ABNORMAL HIGH (ref 70–99)
Glucose-Capillary: 181 mg/dL — ABNORMAL HIGH (ref 70–99)
Glucose-Capillary: 185 mg/dL — ABNORMAL HIGH (ref 70–99)
Glucose-Capillary: 193 mg/dL — ABNORMAL HIGH (ref 70–99)
Glucose-Capillary: 204 mg/dL — ABNORMAL HIGH (ref 70–99)
Glucose-Capillary: 229 mg/dL — ABNORMAL HIGH (ref 70–99)
Glucose-Capillary: 343 mg/dL — ABNORMAL HIGH (ref 70–99)

## 2022-08-22 LAB — COMPREHENSIVE METABOLIC PANEL
ALT: 17 U/L (ref 0–44)
AST: 14 U/L — ABNORMAL LOW (ref 15–41)
Albumin: 4.4 g/dL (ref 3.5–5.0)
Alkaline Phosphatase: 105 U/L (ref 38–126)
Anion gap: 21 — ABNORMAL HIGH (ref 5–15)
BUN: 32 mg/dL — ABNORMAL HIGH (ref 8–23)
CO2: 16 mmol/L — ABNORMAL LOW (ref 22–32)
Calcium: 8.8 mg/dL — ABNORMAL LOW (ref 8.9–10.3)
Chloride: 91 mmol/L — ABNORMAL LOW (ref 98–111)
Creatinine, Ser: 1.41 mg/dL — ABNORMAL HIGH (ref 0.44–1.00)
GFR, Estimated: 42 mL/min — ABNORMAL LOW (ref >60)
Glucose, Bld: 522 mg/dL (ref 70–99)
Potassium: 4.8 mmol/L (ref 3.5–5.1)
Sodium: 128 mmol/L — ABNORMAL LOW (ref 135–145)
Total Bilirubin: 1.6 mg/dL — ABNORMAL HIGH (ref 0.3–1.2)
Total Protein: 8.5 g/dL — ABNORMAL HIGH (ref 6.5–8.1)

## 2022-08-22 LAB — BETA-HYDROXYBUTYRIC ACID: Beta-Hydroxybutyric Acid: 0.33 mmol/L — ABNORMAL HIGH (ref 0.05–0.27)

## 2022-08-22 LAB — CBC
HCT: 41 % (ref 36.0–46.0)
Hemoglobin: 14.1 g/dL (ref 12.0–15.0)
MCH: 29.1 pg (ref 26.0–34.0)
MCHC: 34.4 g/dL (ref 30.0–36.0)
MCV: 84.5 fL (ref 80.0–100.0)
Platelets: 257 10*3/uL (ref 150–400)
RBC: 4.85 MIL/uL (ref 3.87–5.11)
RDW: 12.5 % (ref 11.5–15.5)
WBC: 8.3 10*3/uL (ref 4.0–10.5)
nRBC: 0 % (ref 0.0–0.2)

## 2022-08-22 MED ORDER — INSULIN GLARGINE-YFGN 100 UNIT/ML ~~LOC~~ SOLN
25.0000 [IU] | Freq: Two times a day (BID) | SUBCUTANEOUS | Status: DC
  Administered 2022-08-22: 25 [IU] via SUBCUTANEOUS
  Filled 2022-08-22 (×2): qty 0.25

## 2022-08-22 MED ORDER — INSULIN ASPART 100 UNIT/ML IJ SOLN
0.0000 [IU] | Freq: Three times a day (TID) | INTRAMUSCULAR | Status: DC
  Administered 2022-08-22: 11 [IU] via SUBCUTANEOUS
  Administered 2022-08-22: 3 [IU] via SUBCUTANEOUS
  Filled 2022-08-22 (×3): qty 1

## 2022-08-22 MED ORDER — CEFDINIR 300 MG PO CAPS: 300.0000 mg | ORAL_CAPSULE | Freq: Two times a day (BID) | ORAL | 0 refills | Status: AC

## 2022-08-22 MED ORDER — INSULIN GLARGINE 100 UNIT/ML SOLOSTAR PEN: 25.0000 [IU] | PEN_INJECTOR | Freq: Two times a day (BID) | SUBCUTANEOUS | 2 refills | Status: DC

## 2022-08-22 NOTE — Plan of Care (Signed)
Discharge teaching completed with patient who verbalized understanding of teaching. Patient discharged with all belongings, in stable condition, and taken to ride in wheelchair.

## 2022-08-22 NOTE — Discharge Summary (Signed)
Physician Discharge Summary   Patient: Pamela Lane MRN: [id number] DOB: [birth year]  Admit date:     08/21/2022  Discharge date: 08/22/22  Discharge Physician: Ezekiel Slocumb   PCP: [facility]   Recommendations at discharge:  {Tip this will not be part of the note when signed- Example include specific recommendations for outpatient follow-up, pending tests to follow-up on. (Optional):26781}  Follow up with Primary Care in 1 week Monitor glycemic control closely   Discharge Diagnoses: Principal Problem:   DKA (diabetic ketoacidosis) ([place]) Active Problems:   Type II diabetes mellitus with renal manifestations (HCC)   Essential hypertension   HLD (hyperlipidemia)   SVT (supraventricular tachycardia)   Tobacco abuse   Cocaine abuse (HCC)   Alcohol abuse   Polysubstance abuse (HCC)   Bipolar disorder (HCC)   Anxiety and depression   Asthma   Nausea, vomiting and diarrhea   Chronic kidney disease, stage 3a ([place])   UTI (urinary tract infection)  Resolved Problems:   * No resolved hospital problems. [facility] Course: No notes on file  Assessment and Plan: No notes have been filed under this hospital service. Service: Hospitalist     {Tip this will not be part of the note when signed Body mass index is 28 kg/m. , ,  (Optional):26781}  {(NOTE) Pain control PDMP Statment (Optional):26782} Consultants: *** Procedures performed: ***  Disposition: {Plan; Disposition:26390} Diet recommendation:  {Diet_Plan:26776} DISCHARGE MEDICATION: Allergies as of 08/22/2022   No Known Allergies      Medication List     TAKE these medications    Allergy Relief/Indoor/Outdoor 10 MG tablet Generic drug: cetirizine Take 10 mg by mouth daily.   atorvastatin 40 MG tablet Commonly known as: LIPITOR TAKE 1 TABLET BY MOUTH AT BEDTIME.   buPROPion 150 MG 12 hr tablet Commonly known as: WELLBUTRIN SR Take 150 mg by mouth 2 (two) times daily.    cefdinir 300 MG capsule Commonly known as: OMNICEF Take 1 capsule (300 mg total) by mouth 2 (two) times daily for 3 days.   Daily Multivitamin Caps Take 1 tablet by mouth daily.   Dilt-XR 120 MG 24 hr capsule Generic drug: diltiazem Take 120 mg by mouth daily.   folic acid 1 MG tablet Commonly known as: FOLVITE Take 1 mg by mouth daily.   gabapentin 300 MG capsule Commonly known as: NEURONTIN Take 300 mg by mouth 2 (two) times daily.   hydrOXYzine 50 MG capsule Commonly known as: VISTARIL Take 50 mg by mouth 2 (two) times daily as needed.   insulin glargine 100 UNIT/ML Solostar Pen Commonly known as: LANTUS Inject 25 Units into the skin 2 (two) times daily.   Januvia 100 MG tablet Generic drug: sitaGLIPtin Take 100 mg by mouth daily.   lisinopril 10 MG tablet Commonly known as: ZESTRIL Take 10 mg by mouth daily.   nicotine 21 mg/24hr patch Commonly known as: NICODERM CQ - dosed in mg/24 hours Place 1 patch (21 mg total) onto the skin daily.   NovoLOG FlexPen 100 UNIT/ML FlexPen Generic drug: insulin aspart Inject 15 Units into the skin 3 (three) times daily with meals.   omeprazole 20 MG capsule Commonly known as: PRILOSEC Take 20 mg by mouth daily.   vitamin B-12 500 MCG tablet Commonly known as: CYANOCOBALAMIN Take 500 mcg by mouth daily.   Xigduo XR 04-999 MG Tb24 Generic drug: Dapagliflozin Pro-metFORMIN ER Take 1 tablet by mouth daily.        Discharge  Exam: Filed Weights   08/21/22 1428  Weight: 74 kg   ***  Condition at discharge: {DC Condition:26389}  The results of significant diagnostics from this hospitalization (including imaging, microbiology, ancillary and laboratory) are listed below for reference.   Imaging Studies: CT ABDOMEN PELVIS W CONTRAST  Result Date: 08/21/2022 CLINICAL DATA:  Abdominal pain, vomiting, diarrhea EXAM: CT ABDOMEN AND PELVIS WITH CONTRAST TECHNIQUE: Multidetector CT imaging of the abdomen and pelvis was  performed using the standard protocol following bolus administration of intravenous contrast. RADIATION DOSE REDUCTION: This exam was performed according to the departmental dose-optimization program which includes automated exposure control, adjustment of the mA and/or kV according to patient size and/or use of iterative reconstruction technique. CONTRAST:  194m OMNIPAQUE IOHEXOL 300 MG/ML  SOLN COMPARISON:  None Available. FINDINGS: Lower chest: No acute abnormality. Hepatobiliary: No focal liver abnormality is seen. No gallstones, gallbladder wall thickening, or biliary dilatation. Pancreas: Unremarkable. No pancreatic ductal dilatation or surrounding inflammatory changes. Spleen: Normal in size without focal abnormality. Adrenals/Urinary Tract: Adrenal glands are unremarkable. Kidneys are normal, without renal calculi, focal lesion, or hydronephrosis. Air in the bladder which may be secondary to recent instrumentation versus cystitis. Stomach/Bowel: Stomach is within normal limits. No evidence of bowel wall thickening, distention, or inflammatory changes. Appendix is normal. Vascular/Lymphatic: Normal caliber abdominal aorta with mild atherosclerosis. No lymphadenopathy. Reproductive: Status post hysterectomy. No adnexal masses. Other: No abdominal wall hernia or abnormality. No abdominopelvic ascites. Degenerative disease with disc height loss at L5-S1. Bilateral facet arthropathy throughout the lumbar spine. Moderate osteoarthritis of the right hip. Mild osteoarthritis of bilateral SI joints. Musculoskeletal: No acute osseous abnormality. No aggressive osseous lesion. IMPRESSION: 1. No acute abdominal or pelvic pathology. 2. Air in the bladder which may be secondary to recent instrumentation versus cystitis. 3.  Aortic Atherosclerosis (ICD10-I70.0). Electronically Signed   By: HKathreen DevoidM.D.   On: 08/21/2022 11:28    Microbiology: Results for orders placed or performed during the hospital encounter of  08/21/22  Resp panel by RT-PCR (RSV, Flu A&B, Covid) Urine, Clean Catch     Status: None   Collection Time: 08/21/22  7:36 AM   Specimen: Urine, Clean Catch; Nasal Swab  Result Value Ref Range Status   SARS Coronavirus 2 by RT PCR NEGATIVE NEGATIVE Final    Comment: (NOTE) SARS-CoV-2 target nucleic acids are NOT DETECTED.  The SARS-CoV-2 RNA is generally detectable in upper respiratory specimens during the acute phase of infection. The lowest concentration of SARS-CoV-2 viral copies this assay can detect is 138 copies/mL. A negative result does not preclude SARS-Cov-2 infection and should not be used as the sole basis for treatment or other patient management decisions. A negative result may occur with  improper specimen collection/handling, submission of specimen other than nasopharyngeal swab, presence of viral mutation(s) within the areas targeted by this assay, and inadequate number of viral copies(<138 copies/mL). A negative result must be combined with clinical observations, patient history, and epidemiological information. The expected result is Negative.  Fact Sheet for Patients:  hEntrepreneurPulse.com.au Fact Sheet for Healthcare Providers:  hIncredibleEmployment.be This test is no t yet approved or cleared by the [place] and  has been authorized for detection and/or diagnosis of SARS-CoV-2 by FDA under an Emergency Use Authorization (EUA). This EUA will remain  in effect (meaning this test can be used) for the duration of the COVID-19 declaration under Section 564(b)(1) of the Act, 21 U.S.C.section 360bbb-3(b)(1), unless the authorization is terminated  or  revoked sooner.       Influenza A by PCR NEGATIVE NEGATIVE Final   Influenza B by PCR NEGATIVE NEGATIVE Final    Comment: (NOTE) The Xpert Xpress SARS-CoV-2/FLU/RSV plus assay is intended as an aid in the diagnosis of influenza from Nasopharyngeal swab specimens  and should not be used as a sole basis for treatment. Nasal washings and aspirates are unacceptable for Xpert Xpress SARS-CoV-2/FLU/RSV testing.  Fact Sheet for Patients: EntrepreneurPulse.com.au  Fact Sheet for Healthcare Providers: IncredibleEmployment.be  This test is not yet approved or cleared by the [place] FDA and has been authorized for detection and/or diagnosis of SARS-CoV-2 by FDA under an Emergency Use Authorization (EUA). This EUA will remain in effect (meaning this test can be used) for the duration of the COVID-19 declaration under Section 564(b)(1) of the Act, 21 U.S.C. section 360bbb-3(b)(1), unless the authorization is terminated or revoked.     Resp Syncytial Virus by PCR NEGATIVE NEGATIVE Final    Comment: (NOTE) Fact Sheet for Patients: EntrepreneurPulse.com.au  Fact Sheet for Healthcare Providers: IncredibleEmployment.be  This test is not yet approved or cleared by the [place] FDA and has been authorized for detection and/or diagnosis of SARS-CoV-2 by FDA under an Emergency Use Authorization (EUA). This EUA will remain in effect (meaning this test can be used) for the duration of the COVID-19 declaration under Section 564(b)(1) of the Act, 21 U.S.C. section 360bbb-3(b)(1), unless the authorization is terminated or revoked.  Performed at [facility], [address] [place], [place] [address]   MRSA Next Gen by PCR, Nasal     Status: None   Collection Time: 08/21/22  2:27 PM   Specimen: Nasal Mucosa; Nasal Swab  Result Value Ref Range Status   MRSA by PCR Next Gen NOT DETECTED NOT DETECTED Final    Comment: (NOTE) The GeneXpert MRSA Assay (FDA approved for NASAL specimens only), is one component of a comprehensive MRSA colonization surveillance program. It is not intended to diagnose MRSA infection nor to guide or monitor treatment for MRSA  infections. Test performance is not FDA approved in patients less than 34 years old. Performed at [facility], [address] [place], [place] [address]     Labs: CBC: Recent Labs  Lab 08/21/22 [phone] 08/22/22 0524  WBC 10.3 8.3  HGB 16.7* 14.1  HCT 49.4* 41.0  MCV 85.5 84.5  PLT 354 [id number]   Basic Metabolic Panel: Recent Labs  Lab 08/21/22 1452 08/21/22 1743 08/21/22 2110 08/22/22 0124 08/22/22 0524  NA 126* 132* 131* 135 133*  K 4.8 3.6 3.9 3.5 3.6  CL 94* 99 100 106 105  CO2 16* 20* 20* 22 22  GLUCOSE 486* 198* 219* 142* 129*  BUN 25* 27* 22 18 16  $ CREATININE 1.23* 1.07* 0.91 0.80 0.83  CALCIUM 8.4* 8.7* 8.4* 8.2* 8.6*   Liver Function Tests: Recent Labs  Lab 08/21/22 0830  AST 14*  ALT 17  ALKPHOS 105  BILITOT 1.6*  PROT 8.5*  ALBUMIN 4.4   CBG: Recent Labs  Lab 08/22/22 1016 08/22/22 1119 08/22/22 1237 08/22/22 1344 08/22/22 1632  GLUCAP 204* 229* 181* 193* 343*    Discharge time spent: {LESS THAN/GREATER THAN:26388} 30 minutes.  Signed: Ezekiel Slocumb, DO [facility] 08/22/2022

## 2022-08-23 LAB — URINE CULTURE: Culture: 40000 — AB

## 2023-01-18 ENCOUNTER — Other Ambulatory Visit: Payer: Self-pay

## 2023-01-18 ENCOUNTER — Encounter: Payer: Self-pay | Admitting: Emergency Medicine

## 2023-01-18 ENCOUNTER — Emergency Department
Admission: EM | Admit: 2023-01-18 | Discharge: 2023-01-18 | Disposition: A | Discharge status: Home / Self Care | Payer: MEDICAID | Attending: Emergency Medicine | Admitting: Emergency Medicine

## 2023-01-18 DIAGNOSIS — E119 Type 2 diabetes mellitus without complications: Secondary | ICD-10-CM | POA: Insufficient documentation

## 2023-01-18 DIAGNOSIS — L0291 Cutaneous abscess, unspecified: Secondary | ICD-10-CM

## 2023-01-18 DIAGNOSIS — L02416 Cutaneous abscess of left lower limb: Secondary | ICD-10-CM | POA: Insufficient documentation

## 2023-01-18 MED ORDER — LIDOCAINE HCL (PF) 1 % IJ SOLN
5.0000 mL | Freq: Once | INTRAMUSCULAR | Status: AC
  Administered 2023-01-18: 5 mL via INTRADERMAL
  Filled 2023-01-18: qty 5

## 2023-01-18 MED ORDER — LIDOCAINE-PRILOCAINE 2.5-2.5 % EX CREA
TOPICAL_CREAM | Freq: Once | CUTANEOUS | Status: AC
  Administered 2023-01-18: 1 via TOPICAL
  Filled 2023-01-18: qty 5

## 2023-01-18 MED ORDER — CLINDAMYCIN HCL 300 MG PO CAPS: 300.0000 mg | ORAL_CAPSULE | Freq: Three times a day (TID) | ORAL | 0 refills | Status: AC

## 2023-01-18 NOTE — Discharge Instructions (Addendum)
Follow up with primary care if not improving over the next 2-3 days. Return to the ER if worse.

## 2023-01-18 NOTE — ED Triage Notes (Signed)
Pt via POV from home. Pt states she noticed a vaginal abscess 4 days ago. Pt c/o pain and drainage. Denies any fever. Pt is A&Ox4 and NAD, ambulatory to triage at this time.

## 2023-01-18 NOTE — ED Provider Notes (Signed)
[facility] Provider Note    Event Date/Time   First MD Initiated Contact with Patient 01/18/23 0831     (approximate)   History   Abscess   HPI  Pamela Lane is a 62 y.o. female  with history of diabetes, pancreatitis, cocaine abuse and as listed in EMR presents to the emergency department for evaluation of vaginal abscess x 4 days. No suspected fever..    Physical Exam   Triage Vital Signs: ED Triage Vitals  Enc Vitals Group     BP 01/18/23 0742 131/80     Pulse Rate 01/18/23 0742 (!) 108     Resp 01/18/23 0742 18     Temp 01/18/23 0742 98.5 F (36.9 C)     Temp Source 01/18/23 0742 Oral     SpO2 01/18/23 0742 98 %     Weight 01/18/23 0738 175 lb (79.4 kg)     Height 01/18/23 0738 5\' 3"  (1.6 m)     Head Circumference --      Peak Flow --      Pain Score 01/18/23 0737 9     Pain Loc --      Pain Edu? --      Excl. in GC? --     Most recent vital signs: Vitals:   01/18/23 0742  BP: 131/80  Pulse: (!) 108  Resp: 18  Temp: 98.5 F (36.9 C)  SpO2: 98%    General: Awake, no distress.  CV:  Good peripheral perfusion.  Resp:  Normal effort.  Abd:  No distention.  Other:  2cm indurated area to crease of left inner thigh/left labia with central fluctuance.    ED Results / Procedures / Treatments   Labs (all labs ordered are listed, but only abnormal results are displayed) Labs Reviewed - No data to display   EKG  Not indicated.   RADIOLOGY  Image and radiology report reviewed and interpreted by me. Radiology report consistent with the same.  Not indicated.   PROCEDURES:  Critical Care performed: No  ..Incision and Drainage  Date/Time: 01/18/2023 10:15 AM  Performed by: Chinita Pester, FNP Authorized by: Chinita Pester, FNP   Consent:    Consent obtained:  Verbal   Consent given by:  Patient   Risks discussed:  Bleeding, infection, incomplete drainage and pain Universal protocol:    Procedure explained  and questions answered to patient or proxy's satisfaction: yes     Patient identity confirmed:  Verbally with patient Location:    Type:  Abscess   Size:  2cm   Location:  Lower extremity   Lower extremity location:  Leg   Leg location:  L upper leg (skin crease between inner thigh and labia majora) Pre-procedure details:    Skin preparation:  Povidone-iodine Anesthesia:    Anesthesia method:  Local infiltration   Local anesthetic:  Lidocaine 1% w/o epi Procedure type:    Complexity:  Complex Procedure details:    Incision types:  Stab incision   Incision depth:  Dermal   Wound management:  Probed and deloculated   Drainage:  Bloody and purulent   Drainage amount:  Moderate   Wound treatment:  Wound left open Post-procedure details:    Procedure completion:  Tolerated well, no immediate complications    MEDICATIONS ORDERED IN ED:  Medications  lidocaine-prilocaine (EMLA) cream (1 Application Topical Given 01/18/23 0850)  lidocaine (PF) (XYLOCAINE) 1 % injection 5 mL (5 mLs Intradermal Given by Other  01/18/23 0955)     IMPRESSION / MDM / ASSESSMENT AND PLAN / ED COURSE   I have reviewed the triage note.  Differential diagnosis includes, but is not limited to, abscess, candida, lesion  Patient's presentation is most consistent with acute illness / injury with system symptoms.  62 year old female presents to the ER for evaluation and treatment of abscess. See HPI.  I&D performed as above after risks and benefits discussed. Home care discussed. She is to follow up with her PCP if not improving over the next few days. If worse, she is to return to the ER.       FINAL CLINICAL IMPRESSION(S) / ED DIAGNOSES   Final diagnoses:  Abscess     Rx / DC Orders   ED Discharge Orders          Ordered    clindamycin (CLEOCIN) 300 MG capsule  3 times daily        01/18/23 1012             Note:  This document was prepared using Dragon voice recognition software and  may include unintentional dictation errors.   Chinita Pester, FNP 01/18/23 1021    Chesley Noon, MD 01/18/23 [phone]

## 2023-02-13 ENCOUNTER — Emergency Department
Admission: EM | Admit: 2023-02-13 | Discharge: 2023-02-13 | Disposition: A | Discharge status: Home / Self Care | Payer: MEDICAID | Attending: Emergency Medicine | Admitting: Emergency Medicine

## 2023-02-13 ENCOUNTER — Other Ambulatory Visit: Payer: Self-pay

## 2023-02-13 ENCOUNTER — Emergency Department: Payer: MEDICAID

## 2023-02-13 DIAGNOSIS — Z7984 Long term (current) use of oral hypoglycemic drugs: Secondary | ICD-10-CM | POA: Insufficient documentation

## 2023-02-13 DIAGNOSIS — E1165 Type 2 diabetes mellitus with hyperglycemia: Secondary | ICD-10-CM | POA: Diagnosis not present

## 2023-02-13 DIAGNOSIS — R739 Hyperglycemia, unspecified: Secondary | ICD-10-CM | POA: Diagnosis present

## 2023-02-13 DIAGNOSIS — Z794 Long term (current) use of insulin: Secondary | ICD-10-CM | POA: Insufficient documentation

## 2023-02-13 LAB — CBG MONITORING, ED
Glucose-Capillary: >600 mg/dL (ref 70–99)
Glucose-Capillary: 255 mg/dL — ABNORMAL HIGH (ref 70–99)

## 2023-02-13 LAB — CBC
HCT: 45.1 % (ref 36.0–46.0)
Hemoglobin: 15.5 g/dL — ABNORMAL HIGH (ref 12.0–15.0)
MCH: 29.4 pg (ref 26.0–34.0)
MCHC: 34.4 g/dL (ref 30.0–36.0)
MCV: 85.6 fL (ref 80.0–100.0)
Platelets: 334 10*3/uL (ref 150–400)
RBC: 5.27 MIL/uL — ABNORMAL HIGH (ref 3.87–5.11)
RDW: 12 % (ref 11.5–15.5)
WBC: 8.8 10*3/uL (ref 4.0–10.5)
nRBC: 0 % (ref 0.0–0.2)

## 2023-02-13 LAB — BASIC METABOLIC PANEL
Anion gap: 15 (ref 5–15)
BUN: 27 mg/dL — ABNORMAL HIGH (ref 8–23)
CO2: 21 mmol/L — ABNORMAL LOW (ref 22–32)
Calcium: 9.5 mg/dL (ref 8.9–10.3)
Chloride: 91 mmol/L — ABNORMAL LOW (ref 98–111)
Creatinine, Ser: 1.25 mg/dL — ABNORMAL HIGH (ref 0.44–1.00)
GFR, Estimated: 49 mL/min — ABNORMAL LOW (ref >60)
Glucose, Bld: 717 mg/dL (ref 70–99)
Potassium: 4.5 mmol/L (ref 3.5–5.1)
Sodium: 127 mmol/L — ABNORMAL LOW (ref 135–145)

## 2023-02-13 LAB — TROPONIN I (HIGH SENSITIVITY): Troponin I (High Sensitivity): 3 ng/L (ref <18)

## 2023-02-13 MED ORDER — INSULIN ASPART 100 UNIT/ML IJ SOLN
10.0000 [IU] | Freq: Once | INTRAMUSCULAR | Status: AC
  Administered 2023-02-13: 10 [IU] via INTRAVENOUS
  Filled 2023-02-13: qty 1

## 2023-02-13 MED ORDER — LACTATED RINGERS IV BOLUS
2000.0000 mL | Freq: Once | INTRAVENOUS | Status: AC
  Administered 2023-02-13: 2000 mL via INTRAVENOUS

## 2023-02-13 NOTE — ED Provider Notes (Signed)
[facility] Provider Note    Event Date/Time   First MD Initiated Contact with Patient 02/13/23 0701     (approximate)   History   Chest Pain and Hyperglycemia   HPI TENNILLE Lane is a 62 y.o. female who is on both oral and subcutaneous diabetes medications.  She presents for evaluation of high blood glucose at home.  She states that the skin of her stomach hurts so much that she does not want to inject insulin anymore so she stopped doing it 2 or 3 days ago.  She now is very thirsty with increased urination.  She is very frustrated at dealing with her long-term medical problems and said sometimes she just gets tired and does not want to take her medications anymore.  She is having no pain nor nausea.  No recent vomiting or diarrhea.  She denies fever, chest pain, shortness of breath, and dysuria.  She goes to the [facility] for primary care.  She is confused as to what medication she is supposed to be taking but knows that she is supposed to be taking some oral medicines as well, but says she has been having trouble with her insurance.     Physical Exam   Triage Vital Signs: ED Triage Vitals  Encounter Vitals Group     BP 02/13/23 0639 116/81     Systolic BP Percentile --      Diastolic BP Percentile --      Pulse Rate 02/13/23 0639 (!) 109     Resp 02/13/23 0639 16     Temp 02/13/23 0642 98 F (36.7 C)     Temp Source 02/13/23 0642 Oral     SpO2 02/13/23 0639 97 %     Weight --      Height 02/13/23 0639 1.575 m (5\' 2" )     Head Circumference --      Peak Flow --      Pain Score 02/13/23 0638 10     Pain Loc --      Pain Education --      Exclude from Growth Chart --     Most recent vital signs: Vitals:   02/13/23 0639 02/13/23 0642  BP: 116/81   Pulse: (!) 109   Resp: 16   Temp:  98 F (36.7 C)  SpO2: 97%     General: Awake, no distress.  Patient is tearful but not in physical distress. CV:  Good peripheral perfusion.  Mild  tachycardia. Resp:  Normal effort. Speaking easily and comfortably, no accessory muscle usage nor intercostal retractions.   Abd:  No distention.  Obese.  No obvious bruising, induration, panniculitis/cellulitis, nor other evidence of infection in the lower abdomen.  No tenderness to palpation.   ED Results / Procedures / Treatments   Labs (all labs ordered are listed, but only abnormal results are displayed) Labs Reviewed  BASIC METABOLIC PANEL - Abnormal; Notable for the following components:      Result Value   Sodium 127 (*)    Chloride 91 (*)    CO2 21 (*)    Glucose, Bld 717 (*)    BUN 27 (*)    Creatinine, Ser 1.25 (*)    GFR, Estimated 49 (*)    All other components within normal limits  CBC - Abnormal; Notable for the following components:   RBC 5.27 (*)    Hemoglobin 15.5 (*)    All other components within normal limits  CBG  MONITORING, ED - Abnormal; Notable for the following components:   Glucose-Capillary >600 (*)    All other components within normal limits  CBG MONITORING, ED - Abnormal; Notable for the following components:   Glucose-Capillary 255 (*)    All other components within normal limits  TROPONIN I (HIGH SENSITIVITY)     EKG  ED ECG REPORT I, Loleta Rose, the attending physician, personally viewed and interpreted this ECG.  Date: 02/13/2023 EKG Time: 6:41 AM Rate: 110 Rhythm: Sinus tachycardia QRS Axis: Borderline right axis  Intervals: normal ST/T Wave abnormalities: normal Narrative Interpretation: no evidence of acute ischemia    RADIOLOGY I viewed and interpreted the patient's two-view chest x-ray and I see no sign of pneumonia nor pulmonary edema.  I also read the radiologist's report, which confirmed no acute findings.   PROCEDURES:  Critical Care performed: No  Procedures    IMPRESSION / MDM / ASSESSMENT AND PLAN / ED COURSE  I reviewed the triage vital signs and the nursing notes.                               Differential diagnosis includes, but is not limited to, uncomplicated hyperglycemia, DKA, HHS, nonadherence to medication regimen, other electrolyte or metabolic abnormality including possible kidney disease.  Patient's presentation is most consistent with acute presentation with potential threat to life or bodily function.  Labs/studies ordered: Fingerstick blood sugar, high-sensitivity troponin, basic metabolic panel, CBC, two-view chest x-ray, EKG  Interventions/Medications given:  Medications  insulin aspart (novoLOG) injection 10 Units (10 Units Intravenous Given 02/13/23 0746)  lactated ringers bolus 2,000 mL (2,000 mLs Intravenous New Bag/Given 02/13/23 0745)    (Note:  hospital course my include additional interventions and/or labs/studies not listed above.)   Vital signs notable for tachycardia, possibly due to hypoglycemia or emotional upset.  No evidence of infection.  Blood sugar greater than 700 but with normal anion gap.  Treating with 2 L of crystalloid and 10 units of regular insulin IV.  Will reassess for vital signs and reassess the patient's glucose after treatment.  I counseled the patient about the importance of continuing with her medication regimen including the insulin even if it is uncomfortable.  She understands she needs to speak with her primary care doctor.  The patient is on the cardiac monitor to evaluate for evidence of arrhythmia and/or significant heart rate changes.   Clinical Course as of 02/13/23 0916  Sun Feb 13, 2023  0848 Glucose-Capillary(!): 255 Repeat glucose is 255. [CF]  [phone] Patient is in good spirits, eating and drinking and asking for more food and drink.  She is appropriate for discharge.  I strongly encouraged her to continue taking her medications and follow-up with her PCP at the next available opportunity and she agrees with the plan. [CF]    Clinical Course User Index [CF] Loleta Rose, MD     FINAL CLINICAL IMPRESSION(S) / ED  DIAGNOSES   Final diagnoses:  Hyperglycemia     Rx / DC Orders   ED Discharge Orders     None        Note:  This document was prepared using Dragon voice recognition software and may include unintentional dictation errors.   Loleta Rose, MD 02/13/23 [phone]

## 2023-02-13 NOTE — ED Triage Notes (Signed)
Pt arrived via EMS for c/o chest pain and increased blood sugars for the last 3 days. Pt states she hasn't been taking her insulin the last couple of days. Previous hx of MI about 3 years ago per patient.

## 2023-02-13 NOTE — Discharge Instructions (Signed)

## 2023-04-11 ENCOUNTER — Inpatient Hospital Stay
Admission: EM | Admit: 2023-04-11 | Discharge: 2023-04-13 | DRG: 638 | Disposition: A | Discharge status: Home / Self Care | Payer: MEDICAID | Attending: Internal Medicine | Admitting: Internal Medicine

## 2023-04-11 ENCOUNTER — Other Ambulatory Visit: Payer: Self-pay

## 2023-04-11 ENCOUNTER — Emergency Department: Payer: MEDICAID

## 2023-04-11 DIAGNOSIS — E1310 Other specified diabetes mellitus with ketoacidosis without coma: Secondary | ICD-10-CM

## 2023-04-11 DIAGNOSIS — N390 Urinary tract infection, site not specified: Secondary | ICD-10-CM | POA: Diagnosis present

## 2023-04-11 DIAGNOSIS — K219 Gastro-esophageal reflux disease without esophagitis: Secondary | ICD-10-CM | POA: Diagnosis present

## 2023-04-11 DIAGNOSIS — F1210 Cannabis abuse, uncomplicated: Secondary | ICD-10-CM | POA: Diagnosis present

## 2023-04-11 DIAGNOSIS — Z91148 Patient's other noncompliance with medication regimen for other reason: Secondary | ICD-10-CM | POA: Diagnosis not present

## 2023-04-11 DIAGNOSIS — F319 Bipolar disorder, unspecified: Secondary | ICD-10-CM | POA: Diagnosis present

## 2023-04-11 DIAGNOSIS — J45909 Unspecified asthma, uncomplicated: Secondary | ICD-10-CM | POA: Diagnosis present

## 2023-04-11 DIAGNOSIS — Z7984 Long term (current) use of oral hypoglycemic drugs: Secondary | ICD-10-CM | POA: Diagnosis not present

## 2023-04-11 DIAGNOSIS — Z794 Long term (current) use of insulin: Secondary | ICD-10-CM

## 2023-04-11 DIAGNOSIS — J45901 Unspecified asthma with (acute) exacerbation: Secondary | ICD-10-CM | POA: Diagnosis not present

## 2023-04-11 DIAGNOSIS — N182 Chronic kidney disease, stage 2 (mild): Secondary | ICD-10-CM | POA: Diagnosis present

## 2023-04-11 DIAGNOSIS — N1831 Chronic kidney disease, stage 3a: Secondary | ICD-10-CM | POA: Diagnosis present

## 2023-04-11 DIAGNOSIS — F1910 Other psychoactive substance abuse, uncomplicated: Secondary | ICD-10-CM | POA: Diagnosis not present

## 2023-04-11 DIAGNOSIS — Z79899 Other long term (current) drug therapy: Secondary | ICD-10-CM | POA: Diagnosis not present

## 2023-04-11 DIAGNOSIS — E669 Obesity, unspecified: Secondary | ICD-10-CM | POA: Diagnosis present

## 2023-04-11 DIAGNOSIS — Z6828 Body mass index (BMI) 28.0-28.9, adult: Secondary | ICD-10-CM | POA: Diagnosis not present

## 2023-04-11 DIAGNOSIS — E1110 Type 2 diabetes mellitus with ketoacidosis without coma: Secondary | ICD-10-CM | POA: Diagnosis present

## 2023-04-11 DIAGNOSIS — E876 Hypokalemia: Secondary | ICD-10-CM | POA: Diagnosis present

## 2023-04-11 DIAGNOSIS — Z8249 Family history of ischemic heart disease and other diseases of the circulatory system: Secondary | ICD-10-CM

## 2023-04-11 DIAGNOSIS — F1410 Cocaine abuse, uncomplicated: Secondary | ICD-10-CM | POA: Diagnosis present

## 2023-04-11 DIAGNOSIS — E1122 Type 2 diabetes mellitus with diabetic chronic kidney disease: Secondary | ICD-10-CM | POA: Diagnosis present

## 2023-04-11 DIAGNOSIS — I129 Hypertensive chronic kidney disease with stage 1 through stage 4 chronic kidney disease, or unspecified chronic kidney disease: Secondary | ICD-10-CM | POA: Diagnosis present

## 2023-04-11 DIAGNOSIS — F17210 Nicotine dependence, cigarettes, uncomplicated: Secondary | ICD-10-CM | POA: Diagnosis present

## 2023-04-11 DIAGNOSIS — F1010 Alcohol abuse, uncomplicated: Secondary | ICD-10-CM | POA: Diagnosis present

## 2023-04-11 DIAGNOSIS — Z90710 Acquired absence of both cervix and uterus: Secondary | ICD-10-CM | POA: Diagnosis not present

## 2023-04-11 DIAGNOSIS — I10 Essential (primary) hypertension: Secondary | ICD-10-CM | POA: Diagnosis not present

## 2023-04-11 LAB — COMPREHENSIVE METABOLIC PANEL
ALT: 14 U/L (ref 0–44)
AST: 12 U/L — ABNORMAL LOW (ref 15–41)
Albumin: 3.5 g/dL (ref 3.5–5.0)
Alkaline Phosphatase: 113 U/L (ref 38–126)
Anion gap: 23 — ABNORMAL HIGH (ref 5–15)
BUN: 20 mg/dL (ref 8–23)
CO2: 11 mmol/L — ABNORMAL LOW (ref 22–32)
Calcium: 8.5 mg/dL — ABNORMAL LOW (ref 8.9–10.3)
Chloride: 100 mmol/L (ref 98–111)
Creatinine, Ser: 1.26 mg/dL — ABNORMAL HIGH (ref 0.44–1.00)
GFR, Estimated: 49 mL/min — ABNORMAL LOW (ref >60)
Glucose, Bld: 643 mg/dL (ref 70–99)
Potassium: 5.1 mmol/L (ref 3.5–5.1)
Sodium: 134 mmol/L — ABNORMAL LOW (ref 135–145)
Total Bilirubin: 1.6 mg/dL — ABNORMAL HIGH (ref 0.3–1.2)
Total Protein: 7.5 g/dL (ref 6.5–8.1)

## 2023-04-11 LAB — CBC
HCT: 39.2 % (ref 36.0–46.0)
Hemoglobin: 12.9 g/dL (ref 12.0–15.0)
MCH: 30.1 pg (ref 26.0–34.0)
MCHC: 32.9 g/dL (ref 30.0–36.0)
MCV: 91.6 fL (ref 80.0–100.0)
Platelets: 300 10*3/uL (ref 150–400)
RBC: 4.28 MIL/uL (ref 3.87–5.11)
RDW: 13 % (ref 11.5–15.5)
WBC: 11.7 10*3/uL — ABNORMAL HIGH (ref 4.0–10.5)
nRBC: 0 % (ref 0.0–0.2)

## 2023-04-11 LAB — BASIC METABOLIC PANEL
Anion gap: 11 (ref 5–15)
Anion gap: 9 (ref 5–15)
Anion gap: 6 (ref 5–15)
BUN: 15 mg/dL (ref 8–23)
BUN: 16 mg/dL (ref 8–23)
BUN: 14 mg/dL (ref 8–23)
CO2: 16 mmol/L — ABNORMAL LOW (ref 22–32)
CO2: 21 mmol/L — ABNORMAL LOW (ref 22–32)
CO2: 22 mmol/L (ref 22–32)
Calcium: 8.6 mg/dL — ABNORMAL LOW (ref 8.9–10.3)
Calcium: 8.5 mg/dL — ABNORMAL LOW (ref 8.9–10.3)
Calcium: 8.7 mg/dL — ABNORMAL LOW (ref 8.9–10.3)
Chloride: 103 mmol/L (ref 98–111)
Chloride: 103 mmol/L (ref 98–111)
Chloride: 106 mmol/L (ref 98–111)
Creatinine, Ser: 0.81 mg/dL (ref 0.44–1.00)
Creatinine, Ser: 0.69 mg/dL (ref 0.44–1.00)
Creatinine, Ser: 0.63 mg/dL (ref 0.44–1.00)
GFR, Estimated: >60 mL/min (ref >60)
GFR, Estimated: >60 mL/min (ref >60)
GFR, Estimated: >60 mL/min (ref >60)
Glucose, Bld: 237 mg/dL — ABNORMAL HIGH (ref 70–99)
Glucose, Bld: 248 mg/dL — ABNORMAL HIGH (ref 70–99)
Glucose, Bld: 156 mg/dL — ABNORMAL HIGH (ref 70–99)
Potassium: 3.3 mmol/L — ABNORMAL LOW (ref 3.5–5.1)
Potassium: 4 mmol/L (ref 3.5–5.1)
Potassium: 3.3 mmol/L — ABNORMAL LOW (ref 3.5–5.1)
Sodium: 130 mmol/L — ABNORMAL LOW (ref 135–145)
Sodium: 133 mmol/L — ABNORMAL LOW (ref 135–145)
Sodium: 134 mmol/L — ABNORMAL LOW (ref 135–145)

## 2023-04-11 LAB — MRSA NEXT GEN BY PCR, NASAL: MRSA by PCR Next Gen: NOT DETECTED

## 2023-04-11 LAB — GLUCOSE, CAPILLARY
Glucose-Capillary: 332 mg/dL — ABNORMAL HIGH (ref 70–99)
Glucose-Capillary: 277 mg/dL — ABNORMAL HIGH (ref 70–99)
Glucose-Capillary: 210 mg/dL — ABNORMAL HIGH (ref 70–99)
Glucose-Capillary: 231 mg/dL — ABNORMAL HIGH (ref 70–99)
Glucose-Capillary: 269 mg/dL — ABNORMAL HIGH (ref 70–99)
Glucose-Capillary: 252 mg/dL — ABNORMAL HIGH (ref 70–99)
Glucose-Capillary: 230 mg/dL — ABNORMAL HIGH (ref 70–99)
Glucose-Capillary: 209 mg/dL — ABNORMAL HIGH (ref 70–99)
Glucose-Capillary: 255 mg/dL — ABNORMAL HIGH (ref 70–99)
Glucose-Capillary: 183 mg/dL — ABNORMAL HIGH (ref 70–99)
Glucose-Capillary: 248 mg/dL — ABNORMAL HIGH (ref 70–99)
Glucose-Capillary: 191 mg/dL — ABNORMAL HIGH (ref 70–99)
Glucose-Capillary: 143 mg/dL — ABNORMAL HIGH (ref 70–99)
Glucose-Capillary: 157 mg/dL — ABNORMAL HIGH (ref 70–99)
Glucose-Capillary: 137 mg/dL — ABNORMAL HIGH (ref 70–99)
Glucose-Capillary: 170 mg/dL — ABNORMAL HIGH (ref 70–99)

## 2023-04-11 LAB — URINE DRUG SCREEN, QUALITATIVE (ARMC ONLY)
Amphetamines, Ur Screen: NOT DETECTED
Barbiturates, Ur Screen: NOT DETECTED
Benzodiazepine, Ur Scrn: NOT DETECTED
Cannabinoid 50 Ng, Ur ~~LOC~~: NOT DETECTED
Cocaine Metabolite,Ur ~~LOC~~: POSITIVE — AB
MDMA (Ecstasy)Ur Screen: NOT DETECTED
Methadone Scn, Ur: NOT DETECTED
Opiate, Ur Screen: NOT DETECTED
Phencyclidine (PCP) Ur S: NOT DETECTED
Tricyclic, Ur Screen: NOT DETECTED

## 2023-04-11 LAB — URINALYSIS, ROUTINE W REFLEX MICROSCOPIC
Bilirubin Urine: NEGATIVE
Glucose, UA: >=500 mg/dL — AB
Ketones, ur: 80 mg/dL — AB
Nitrite: NEGATIVE
Protein, ur: NEGATIVE mg/dL
Specific Gravity, Urine: 1.022 (ref 1.005–1.030)
pH: 6 (ref 5.0–8.0)

## 2023-04-11 LAB — ETHANOL: Alcohol, Ethyl (B): <10 mg/dL (ref <10)

## 2023-04-11 LAB — BLOOD GAS, VENOUS
Acid-base deficit: 17.7 mmol/L — ABNORMAL HIGH (ref 0.0–2.0)
Bicarbonate: 10.3 mmol/L — ABNORMAL LOW (ref 20.0–28.0)
O2 Saturation: 57.7 %
Patient temperature: 37
pCO2, Ven: 31 mm[Hg] — ABNORMAL LOW (ref 44–60)
pH, Ven: 7.13 — CL (ref 7.25–7.43)
pO2, Ven: 38 mm[Hg] (ref 32–45)

## 2023-04-11 LAB — TROPONIN I (HIGH SENSITIVITY)
Troponin I (High Sensitivity): 4 ng/L (ref <18)
Troponin I (High Sensitivity): 4 ng/L (ref <18)

## 2023-04-11 LAB — CBG MONITORING, ED
Glucose-Capillary: 499 mg/dL — ABNORMAL HIGH (ref 70–99)
Glucose-Capillary: 565 mg/dL (ref 70–99)
Glucose-Capillary: 400 mg/dL — ABNORMAL HIGH (ref 70–99)
Glucose-Capillary: 484 mg/dL — ABNORMAL HIGH (ref 70–99)

## 2023-04-11 LAB — BETA-HYDROXYBUTYRIC ACID
Beta-Hydroxybutyric Acid: >8 mmol/L — ABNORMAL HIGH (ref 0.05–0.27)
Beta-Hydroxybutyric Acid: 1.66 mmol/L — ABNORMAL HIGH (ref 0.05–0.27)
Beta-Hydroxybutyric Acid: 0.34 mmol/L — ABNORMAL HIGH (ref 0.05–0.27)

## 2023-04-11 MED ORDER — ENOXAPARIN SODIUM 40 MG/0.4ML IJ SOSY
40.0000 mg | PREFILLED_SYRINGE | INTRAMUSCULAR | Status: DC
  Administered 2023-04-11 – 2023-04-12 (×2): 40 mg via SUBCUTANEOUS
  Filled 2023-04-11 (×2): qty 0.4

## 2023-04-11 MED ORDER — INSULIN GLARGINE-YFGN 100 UNIT/ML ~~LOC~~ SOLN
25.0000 [IU] | Freq: Every day | SUBCUTANEOUS | Status: DC
  Administered 2023-04-11: 25 [IU] via SUBCUTANEOUS
  Filled 2023-04-11 (×2): qty 0.25

## 2023-04-11 MED ORDER — DEXTROSE IN LACTATED RINGERS 5 % IV SOLN: INTRAVENOUS | Status: DC

## 2023-04-11 MED ORDER — INSULIN REGULAR(HUMAN) IN NACL 100-0.9 UT/100ML-% IV SOLN
INTRAVENOUS | Status: DC
  Administered 2023-04-11: 16 [IU]/h via INTRAVENOUS
  Filled 2023-04-11: qty 100

## 2023-04-11 MED ORDER — LACTATED RINGERS IV SOLN: INTRAVENOUS | Status: DC

## 2023-04-11 MED ORDER — LORAZEPAM 1 MG PO TABS
1.0000 mg | ORAL_TABLET | ORAL | Status: DC | PRN
  Administered 2023-04-12 – 2023-04-13 (×2): 1 mg via ORAL
  Filled 2023-04-11 (×2): qty 1

## 2023-04-11 MED ORDER — BUDESONIDE 0.5 MG/2ML IN SUSP
0.5000 mg | Freq: Two times a day (BID) | RESPIRATORY_TRACT | Status: DC
  Administered 2023-04-11 – 2023-04-13 (×4): 0.5 mg via RESPIRATORY_TRACT
  Filled 2023-04-11 (×3): qty 2

## 2023-04-11 MED ORDER — INSULIN ASPART 100 UNIT/ML IJ SOLN
0.0000 [IU] | Freq: Three times a day (TID) | INTRAMUSCULAR | Status: DC
  Administered 2023-04-12: 8 [IU] via SUBCUTANEOUS
  Administered 2023-04-12 (×2): 11 [IU] via SUBCUTANEOUS
  Administered 2023-04-13: 5 [IU] via SUBCUTANEOUS
  Administered 2023-04-13: 11 [IU] via SUBCUTANEOUS
  Filled 2023-04-11 (×5): qty 1

## 2023-04-11 MED ORDER — ADULT MULTIVITAMIN W/MINERALS CH
1.0000 | ORAL_TABLET | Freq: Every day | ORAL | Status: DC
  Administered 2023-04-11 – 2023-04-13 (×3): 1 via ORAL
  Filled 2023-04-11 (×3): qty 1

## 2023-04-11 MED ORDER — LORAZEPAM 2 MG/ML IJ SOLN: 1.0000 mg | INTRAMUSCULAR | Status: DC | PRN

## 2023-04-11 MED ORDER — DEXTROSE 50 % IV SOLN: 0.0000 mL | INTRAVENOUS | Status: DC | PRN

## 2023-04-11 MED ORDER — INSULIN ASPART 100 UNIT/ML IJ SOLN
0.0000 [IU] | Freq: Every day | INTRAMUSCULAR | Status: DC
  Administered 2023-04-12: 2 [IU] via SUBCUTANEOUS
  Filled 2023-04-11: qty 1

## 2023-04-11 MED ORDER — POTASSIUM CHLORIDE 20 MEQ PO PACK
20.0000 meq | PACK | Freq: Two times a day (BID) | ORAL | Status: DC
  Administered 2023-04-11: 20 meq via ORAL
  Filled 2023-04-11: qty 1

## 2023-04-11 MED ORDER — THIAMINE MONONITRATE 100 MG PO TABS
100.0000 mg | ORAL_TABLET | Freq: Every day | ORAL | Status: DC
  Administered 2023-04-11 – 2023-04-13 (×3): 100 mg via ORAL
  Filled 2023-04-11 (×3): qty 1

## 2023-04-11 MED ORDER — LACTATED RINGERS IV BOLUS: 1000.0000 mL | INTRAVENOUS | Status: AC

## 2023-04-11 MED ORDER — POTASSIUM CHLORIDE 20 MEQ PO PACK: 20.0000 meq | PACK | Freq: Two times a day (BID) | ORAL | Status: DC

## 2023-04-11 MED ORDER — ENOXAPARIN SODIUM 40 MG/0.4ML IJ SOSY: 40.0000 mg | PREFILLED_SYRINGE | INTRAMUSCULAR | Status: DC

## 2023-04-11 MED ORDER — ONDANSETRON HCL 4 MG PO TABS
4.0000 mg | ORAL_TABLET | Freq: Four times a day (QID) | ORAL | Status: DC | PRN
  Filled 2023-04-11: qty 1

## 2023-04-11 MED ORDER — POTASSIUM CHLORIDE 10 MEQ/100ML IV SOLN
10.0000 meq | INTRAVENOUS | Status: AC
  Administered 2023-04-11: 10 meq via INTRAVENOUS
  Filled 2023-04-11 (×2): qty 100

## 2023-04-11 MED ORDER — FOLIC ACID 1 MG PO TABS
1.0000 mg | ORAL_TABLET | Freq: Every day | ORAL | Status: DC
  Administered 2023-04-11 – 2023-04-13 (×3): 1 mg via ORAL
  Filled 2023-04-11 (×3): qty 1

## 2023-04-11 MED ORDER — IPRATROPIUM-ALBUTEROL 0.5-2.5 (3) MG/3ML IN SOLN
3.0000 mL | RESPIRATORY_TRACT | Status: DC
  Administered 2023-04-11: 3 mL via RESPIRATORY_TRACT
  Filled 2023-04-11: qty 3

## 2023-04-11 MED ORDER — ACETAMINOPHEN 325 MG PO TABS
650.0000 mg | ORAL_TABLET | Freq: Four times a day (QID) | ORAL | Status: DC | PRN
  Administered 2023-04-11 – 2023-04-13 (×3): 650 mg via ORAL
  Filled 2023-04-11 (×3): qty 2

## 2023-04-11 MED ORDER — SODIUM CHLORIDE 0.9 % IV SOLN
2.0000 g | INTRAVENOUS | Status: DC
  Administered 2023-04-12 – 2023-04-13 (×2): 2 g via INTRAVENOUS
  Filled 2023-04-11 (×2): qty 20

## 2023-04-11 MED ORDER — INSULIN REGULAR(HUMAN) IN NACL 100-0.9 UT/100ML-% IV SOLN
INTRAVENOUS | Status: DC
  Administered 2023-04-11: 14 [IU]/h via INTRAVENOUS
  Filled 2023-04-11: qty 100

## 2023-04-11 MED ORDER — IPRATROPIUM-ALBUTEROL 0.5-2.5 (3) MG/3ML IN SOLN
3.0000 mL | Freq: Once | RESPIRATORY_TRACT | Status: AC
  Administered 2023-04-11: 3 mL via RESPIRATORY_TRACT
  Filled 2023-04-11: qty 3

## 2023-04-11 MED ORDER — ONDANSETRON HCL 4 MG/2ML IJ SOLN: 4.0000 mg | Freq: Four times a day (QID) | INTRAMUSCULAR | Status: DC | PRN

## 2023-04-11 MED ORDER — ALBUTEROL SULFATE (2.5 MG/3ML) 0.083% IN NEBU: 2.5000 mg | INHALATION_SOLUTION | RESPIRATORY_TRACT | Status: DC | PRN

## 2023-04-11 MED ORDER — IPRATROPIUM-ALBUTEROL 0.5-2.5 (3) MG/3ML IN SOLN
3.0000 mL | RESPIRATORY_TRACT | Status: DC | PRN
Start: 2023-04-11 — End: 2023-04-11

## 2023-04-11 MED ORDER — CHLORHEXIDINE GLUCONATE CLOTH 2 % EX PADS
6.0000 | MEDICATED_PAD | Freq: Every day | CUTANEOUS | Status: DC
  Administered 2023-04-12 – 2023-04-13 (×2): 6 via TOPICAL

## 2023-04-11 MED ORDER — FLUCONAZOLE 100 MG PO TABS
200.0000 mg | ORAL_TABLET | Freq: Once | ORAL | Status: AC
  Administered 2023-04-11: 200 mg via ORAL
  Filled 2023-04-11: qty 2

## 2023-04-11 MED ORDER — SODIUM CHLORIDE 0.9 % IV SOLN
1.0000 g | INTRAVENOUS | Status: AC
  Administered 2023-04-11: 1 g via INTRAVENOUS
  Filled 2023-04-11: qty 10

## 2023-04-11 MED ORDER — POTASSIUM CHLORIDE 10 MEQ/100ML IV SOLN: 10.0000 meq | INTRAVENOUS | Status: DC

## 2023-04-11 MED ORDER — LACTATED RINGERS IV BOLUS
20.0000 mL/kg | Freq: Once | INTRAVENOUS | Status: AC
  Administered 2023-04-11: 1524 mL via INTRAVENOUS

## 2023-04-11 MED ORDER — THIAMINE HCL 100 MG/ML IJ SOLN
100.0000 mg | Freq: Every day | INTRAMUSCULAR | Status: DC
  Filled 2023-04-11: qty 2

## 2023-04-11 MED ORDER — IPRATROPIUM-ALBUTEROL 0.5-2.5 (3) MG/3ML IN SOLN
3.0000 mL | Freq: Two times a day (BID) | RESPIRATORY_TRACT | Status: DC
  Administered 2023-04-12 – 2023-04-13 (×3): 3 mL via RESPIRATORY_TRACT
  Filled 2023-04-11 (×2): qty 3

## 2023-04-11 NOTE — Plan of Care (Signed)
  Problem: Education: Goal: Ability to describe self-care measures that may prevent or decrease complications (Diabetes Survival Skills Education) will improve Outcome: Progressing   Problem: Fluid Volume: Goal: Ability to achieve a balanced intake and output will improve Outcome: Progressing   Problem: Metabolic: Goal: Ability to maintain appropriate glucose levels will improve Outcome: Progressing   Problem: Nutritional: Goal: Maintenance of adequate nutrition will improve Outcome: Progressing   Problem: Urinary Elimination: Goal: Ability to achieve and maintain adequate renal perfusion and functioning will improve Outcome: Progressing   Problem: Coping: Goal: Ability to adjust to condition or change in health will improve Outcome: Progressing

## 2023-04-11 NOTE — Discharge Instructions (Signed)
Addiction and Co-Occurring Disorders   The [facility], Intensive Outpatient Program is held three afternoons from 1 to 4pm; Monday, Wednesday and Friday allowing patients the opportunity to participate in a treatment program in the day while attending A meetings in the evening. W e provide educational lectures and films, group therapy, individual counseling and family counseling.  A multidisciplinary team of the [facility] healthcare professionals, headed by the Medical Director, designs an individualized treatment program to suit the needs of each patient. The length of stay in this program is determined by the goals and objectives set by the treatment team, but usually is 6 to 10 weeks. Direct admission to IOP is possible after an individual evaluation by our assessment team. Chemical usage patterns, the progressionof disease in the individual, the possible need for detoxification and the availability of an external support system are factors in determining who is appropriate for. direct OP admission. Each person has the opportunity to embrace a lifestyle of on-going recovery, family involvement and attending a 12 Step Support group. Program Objectives Include  Chemicaldependency education Abstinence for all mind-altering chemicals 12 Step support Relapse Prevention Planning Improved communication skills Spirituality and personal fulfilment Mondav-Wednesday-Friday 1:00pm to 4:00pm Individual and Family Sessions are set by the Counselor [place], [place], [place] Licensed Clinical Addictions Spcialist [phone]    [facility]enc[facility] Intensive OutpatientPrograms Alcohol & Drug Services (ADS) [address], [place] [address] [place]: [phone] [place]: [place]: [place]: [phone] Both daytime & evening counseling available Accepts Medicaid and other insurances  [facility] four  times a week for 2 1?2 -3 hours each session Both daytime & evening counseling available Accepts Medicaid and other insurances  [facility] CD-IOP - Outpatient Services [address] [place] [address] [phone] Meets 3 days a week for 4 hours Accepts Medicare and other insurances (not Medicaid)  [place].: [facility] [place] [address] [place] [address] [phone]  These referrals have been provided to you as appropriate for your clinical needs while taking into account your financial concerns. Please be aware that agencies, practitioners and insurance companies sometimes change contracts. When calling to make an appointment have your insurance information available so the professional you aregoing to see can confirm whether they are covered by your plan. Take this form with you in case the person you are seeing needs a copy or to contact us. Assessment Counselor   [facility] Intensive Outpatient Program [place], [place] [address] [phone] Private insurance, Medicare A&B, and [facility] ADS (Alcohol and Drug Services) [address] [place], [place], [place] [address] [phone] Medicaid, Self Pay  [place] [address] [place], Self Pay  [facility] and Partial Hospitalization Program [place]. [place], [place] [address] [phone] Private Insurance, [place] only for partial [facility] [place]. [place], [place] Private Insurance and Self Pay  [facility] '[address] 7 1 [address] [place] [address] [place], [place], and Self Pay.  Medical laboratory scientific officer (Partial Hospitalization) [place]  [address] [place] [address] Private Insurance, [place], and Self Pay  [facility] [address] [place], [place], [place] [address] [phone] [place]  [place], [place] [address] Phone: [phone] or [phone] [place], [place] [address] [phone] Medicaid, private insurance [facility] [address] [place], [place] [address] [phone] or [phone] Self Pay only, sliding scale  [facility] [address] [place], [place] [address] [phone] State funds for uninsured [place]. [place], [place] Private Insurance, and Self pay [employer], some Medicaid Crisis Mobile: [facility]: [phone] (for crisis response 24 hours a day) [place] [phone]   [place]. [place], [place], and [place] Programs [address] [place], [place], and [place] . [facility]: [phone] ASAP: Adolescent Substance Abuse Program Males ages: 12-17 ASAP: Adolescent Substance abuse Program Females: 12-17 [facility] [employer] Structured Day Program [phone] CrisisMobile: [facility]: [phone] (for crisis response 24 hours a day) [facility] [phone]

## 2023-04-11 NOTE — Progress Notes (Signed)
Patient A/Ox4, ST/NSR, stable pressures. Patient on insulin drip adjusted per Endotool.    Patient given PRN tylenol per request

## 2023-04-11 NOTE — Inpatient Diabetes Management (Addendum)
Inpatient Diabetes Program Recommendations  AACE/ADA: New Consensus Statement on Inpatient Glycemic Control (2015)  Target Ranges:  Prepandial:   less than 140 mg/dL      Peak postprandial:   less than 180 mg/dL (1-2 hours)      Critically ill patients:  140 - 180 mg/dL    Latest Reference Range & Units 04/11/23 03:28  Beta-Hydroxybutyric Acid 0.05 - 0.27 mmol/L >8.00 (H)  (H): Data is abnormally high  Latest Reference Range & Units 04/11/23 03:28  Sodium 135 - 145 mmol/L 134 (L)  Potassium 3.5 - 5.1 mmol/L 5.1  Chloride 98 - 111 mmol/L 100  CO2 22 - 32 mmol/L 11 (L)  Glucose 70 - 99 mg/dL 846 (HH)  BUN 8 - 23 mg/dL 20  Creatinine 9.62 - 9.52 mg/dL 8.41 (H)  Calcium 8.9 - 10.3 mg/dL 8.5 (L)  Anion gap 5 - 15  23 (H)    Latest Reference Range & Units 04/11/23 04:27 04/11/23 05:55 04/11/23 07:36 04/11/23 08:14 04/11/23 08:27 04/11/23 09:12  Glucose-Capillary 70 - 99 mg/dL 324 (H) 401 (HH)  IV Insulin Drip Started @ 0643 484 (H) 400 (H) 332 (H) 277 (H)  IV Insulin Drip Running  (HH): Data is critically high (H): Data is abnormally high    Admit with: DKA Per ED MD notes: "She says that she stopped taking her insulin a couple of days ago which sometimes she does because she gets frustrated. She says she also was drinking wine 2 days ago which she is not used to doing."   History: DM2  Home DM Meds: Lantus 25 units BID       Januvia 100 mg Daily       Novolog 15 units TID with meals       Xigduo 04/999 mg Daily  Current Orders: IV Insulin Drip   [facility]P: [facility]  Pt familiar to the Inpatient Diabetes Team--Was counseled at length about her diabetes 03/2022   MD- Please add Hemoglobin A1c level to current Labs    Addendum 1pm--Net w/ pt at bedside.  She was lying flat in bed with the covers up to her neck but awoke when I called her name.  Pt got upset with me when I told her I didn't have her food tray--She stated to me that somebody told her she could  eat and she was starving.  Explained to pt that pts are not usually allowed to eat until we get them off the IV Insulin Drip and that currently she is requiring IV Insulin and IVF to treat her DKA since she decided not to take her insulin for 3 days.  I tried to confirm her meds with her and pt stated to me that she takes "a whole mess of pills" and "can't remember all of them".  Was able to confirm she is supposed to be taking Lantus insulin--Chart states 25 units BID but pt states 55 units once daily.  Pt told me "I ain't taking no Novolog".  Goes to the [facility] for her care and gets meds filled thru the [facility] pharmacy.  States she has CBG meter at home--Not sure if she is actually checking CBGs?  Per RN caring for pt, pt admitted to smoking crack cocaine prior to admission.  Has Hx of Polysubstance Abuse.     --Will follow patient during hospitalization--  Ambrose Finland RN, MSN, CDCES Diabetes Coordinator Inpatient Glycemic Control Team Team Pager: [phone] (8a-5p)

## 2023-04-11 NOTE — ED Triage Notes (Signed)
Pt presents to ER via ems from home with c/o hyperglycemia.  Ems states pt has been drinking alcohol tonight, though pt denies any alcohol consumption, stating last time she drank was appx "3 days ago."  Pt has hx of diabetes, and is non-compliant with her insulin.  Pt is otherwise A&O x4 and in NAD.    EMS CBG >600 Pt received appx 900cc NS pta.

## 2023-04-11 NOTE — Assessment & Plan Note (Signed)
UA indicative of infection in setting of DKA and malaise  IV rocephin  Urine culture  Follow

## 2023-04-11 NOTE — ED Provider Notes (Signed)
[facility] Provider Note    Event Date/Time   First MD Initiated Contact with Patient 04/11/23 [phone]     (approximate)   History   Hyperglycemia   HPI Pamela Lane is a 62 y.o. female with history of type 2 diabetes and prior episodes of DKA.  She presents tonight by EMS for high blood sugar and feeling ill in general.  She says that she stopped taking her insulin a couple of days ago which sometimes she does because she gets frustrated.  She says she also was drinking wine 2 days ago which she is not used to doing.  She thinks the wind made her throw up and then her throat has been sore and uncomfortable and she has not wanted to eat or drink very much or take her insulin.  She denies any alcohol consumption for at least the last day.  However she is continue to have some upper abdominal discomfort and some nausea and vomiting.  She also said that it is a little bit hard for her to breathe and that she smokes but she does not know if she has COPD.     Physical Exam   Triage Vital Signs: ED Triage Vitals  Encounter Vitals Group     BP 04/11/23 0321 137/64     Systolic BP Percentile --      Diastolic BP Percentile --      Pulse Rate 04/11/23 0321 (!) 112     Resp 04/11/23 0321 18     Temp 04/11/23 0321 98.6 F (37 C)     Temp Source 04/11/23 0321 Oral     SpO2 04/11/23 0321 100 %     Weight 04/11/23 0324 76.2 kg (168 lb)     Height 04/11/23 0324 1.6 m (5\' 3" )     Head Circumference --      Peak Flow --      Pain Score 04/11/23 0324 8     Pain Loc --      Pain Education --      Exclude from Growth Chart --     Most recent vital signs: Vitals:   04/11/23 0321  BP: 137/64  Pulse: (!) 112  Resp: 18  Temp: 98.6 F (37 C)  SpO2: 100%    General: Awake, alert, oriented. CV:  Good peripheral perfusion.  Tachycardia, regular rhythm.  Normal heart sounds. Resp:  Normal effort. Speaking easily and comfortably, no accessory muscle usage nor  intercostal retractions.  Expiratory wheezing throughout lung fields consistent with obstructive pulmonary disease. Abd:  No distention.  No tenderness to palpation of the abdomen.   ED Results / Procedures / Treatments   Labs (all labs ordered are listed, but only abnormal results are displayed) Labs Reviewed  CBC - Abnormal; Notable for the following components:      Result Value   WBC 11.7 (*)    All other components within normal limits  COMPREHENSIVE METABOLIC PANEL - Abnormal; Notable for the following components:   Sodium 134 (*)    CO2 11 (*)    Glucose, Bld 643 (*)    Creatinine, Ser 1.26 (*)    Calcium 8.5 (*)    AST 12 (*)    Total Bilirubin 1.6 (*)    GFR, Estimated 49 (*)    Anion gap 23 (*)    All other components within normal limits  URINALYSIS, ROUTINE W REFLEX MICROSCOPIC - Abnormal; Notable for the following components:  Color, Urine STRAW (*)    APPearance HAZY (*)    Glucose, UA >=500 (*)    Hgb urine dipstick SMALL (*)    Ketones, ur 80 (*)    Leukocytes,Ua LARGE (*)    Bacteria, UA FEW (*)    All other components within normal limits  BETA-HYDROXYBUTYRIC ACID - Abnormal; Notable for the following components:   Beta-Hydroxybutyric Acid >8.00 (*)    All other components within normal limits  BLOOD GAS, VENOUS - Abnormal; Notable for the following components:   pH, Ven 7.13 (*)    pCO2, Ven 31 (*)    Bicarbonate 10.3 (*)    Acid-base deficit 17.7 (*)    All other components within normal limits  CBG MONITORING, ED - Abnormal; Notable for the following components:   Glucose-Capillary 499 (*)    All other components within normal limits  URINE CULTURE  ETHANOL  CBG MONITORING, ED  TROPONIN I (HIGH SENSITIVITY)  TROPONIN I (HIGH SENSITIVITY)     EKG  ED ECG REPORT I, Loleta Rose, the attending physician, personally viewed and interpreted this ECG.  Date: 04/11/2023 EKG Time: 5:12 Rate: 120 Rhythm: sinus tachycardia QRS Axis:  normal Intervals: normal ST/T Wave abnormalities: Non-specific ST segment / T-wave changes, but no clear evidence of acute ischemia. Narrative Interpretation: no definitive evidence of acute ischemia; does not meet STEMI criteria.    RADIOLOGY I viewed and interpreted the patient's chest x-ray and I see no evidence of pneumonia or pulmonary edema.   PROCEDURES:  Critical Care performed: Yes, see critical care procedure note(s)  .1-3 Lead EKG Interpretation  Performed by: Loleta Rose, MD Authorized by: Loleta Rose, MD     Interpretation: abnormal     ECG rate:  110   ECG rate assessment: tachycardic     Rhythm: sinus tachycardia     Ectopy: none     Conduction: normal   .Critical Care  Performed by: Loleta Rose, MD Authorized by: Loleta Rose, MD   Critical care provider statement:    Critical care time (minutes):  30   Critical care time was exclusive of:  Separately billable procedures and treating other patients   Critical care was necessary to treat or prevent imminent or life-threatening deterioration of the following conditions:  Metabolic crisis   Critical care was time spent personally by me on the following activities:  Development of treatment plan with patient or surrogate, evaluation of patient's response to treatment, examination of patient, obtaining history from patient or surrogate, ordering and performing treatments and interventions, ordering and review of laboratory studies, ordering and review of radiographic studies, pulse oximetry, re-evaluation of patient's condition and review of old charts     IMPRESSION / MDM / ASSESSMENT AND PLAN / ED COURSE  I reviewed the triage vital signs and the nursing notes.                              Differential diagnosis includes, but is not limited to, DKA, alcoholic ketoacidosis, acute alcohol intoxication, bacterial or viral infection, COPD exacerbation, SBO/ileus.  Patient's presentation is most consistent  with acute presentation with potential threat to life or bodily function.  Labs/studies ordered: 1 view chest x-ray, EKG, CBC, ethanol level, CMP, high-sensitivity troponin, beta hydroxybutyric acid, VBG, urinalysis, urine culture  Interventions/Medications given:  Medications  cefTRIAXone (ROCEPHIN) 1 g in sodium chloride 0.9 % 100 mL IVPB (1 g Intravenous New Bag/Given 04/11/23 0501)  insulin regular, human (MYXREDLIN) 100 units/ 100 mL infusion (has no administration in time range)  lactated ringers infusion (has no administration in time range)  dextrose 5 % in lactated ringers infusion (has no administration in time range)  dextrose 50 % solution 0-50 mL (has no administration in time range)  lactated ringers bolus 1,000 mL (has no administration in time range)  ipratropium-albuterol (DUONEB) 0.5-2.5 (3) MG/3ML nebulizer solution 3 mL (3 mLs Nebulization Given 04/11/23 0501)    (Note:  hospital course my include additional interventions and/or labs/studies not listed above.)   Vital signs are notable for tachycardia.  Initially I was concerned for alcohol intoxication but ethanol levels negative.  Awaiting results of metabolic panel but I strongly suspect DKA because her CBG shows elevated glucose and she has not been taking her insulin.  Urinalysis pending as well.  Anticipate admission.  Patient protecting airway and has no obvious CNS compromise.  The patient is on the cardiac monitor to evaluate for evidence of arrhythmia and/or significant heart rate changes.   Clinical Course as of 04/11/23 0514  Mon Apr 11, 2023  8295 Urinalysis, Routine w reflex microscopic -Urine, Clean Catch(!) Urinalysis suggestive of UTI and I will treat empirically with ceftriaxone 1 g IV.  She has large leukocytes in addition to WBCs.  She also has 80 ketones and I am awaiting the results of the rest of her metabolic panel [CF]  0434 Patient's VBG shows acidosis, and her metabolic panel shows a CO2 of 11  with a glucose in the 600s and an anion gap of 23.  I will aggressively fluid resuscitate with an initial 2 L of LR bolus and begin treatment of DKA as per protocol with insulin infusion.  Patient will require admission to the hospitalist service. [CF]  [phone] Updated the patient.  Consulting hospitalist for admission. [CF]  0440 Beta-Hydroxybutyric Acid(!): >8.00 [CF]  0459 Consulted with Dr. Para March with the hospitalist service who will admit the patient [CF]    Clinical Course User Index [CF] Loleta Rose, MD     FINAL CLINICAL IMPRESSION(S) / ED DIAGNOSES   Final diagnoses:  Diabetic ketoacidosis without coma associated with type 2 diabetes mellitus (HCC)  Urinary tract infection without hematuria, site unspecified     Rx / DC Orders   ED Discharge Orders     None        Note:  This document was prepared using Dragon voice recognition software and may include unintentional dictation errors.   Loleta Rose, MD 04/11/23 [phone]

## 2023-04-11 NOTE — Assessment & Plan Note (Signed)
Patient reports regular alcohol use though does not fully specify CIWA protocol Monitor

## 2023-04-11 NOTE — Assessment & Plan Note (Signed)
Meeting DKA criteria with blood sugar in the 600s, bicarb of 11, pH of 7.13 on VBG BHB of 8 Positive urine ketones Baseline poor compliance and polysubstance abuse likely major confounders Started on DKA protocol in the ER-will continue Monitor

## 2023-04-11 NOTE — Assessment & Plan Note (Signed)
PPI  ? ?

## 2023-04-11 NOTE — TOC Initial Note (Signed)
Transition of Care [facility]) - Initial/Assessment Note    Patient Details  Name: DOE CRUSER MRN: [id number] Date of Birth: 05-01-1961  Transition of Care [facility]) CM/SW Contact:    Margarito Liner, LCSW Phone Number: 04/11/2023, 2:45 PM  Clinical Narrative:  Readmission prevention screen complete. CSW met with patient. No supports at bedside. CSW introduced role and explained that discharge planning would be discussed. PCP is at [facility]. She sees two providers there but does not remember their names. She uses [facility] through [employer]. Patient uses the [facility]. No issues obtaining medications. Patient reports being out of all of her medications. No hospitalist listed as the attending yet so CSW sent secure chat to pharmacist to notify. Patient lives home with her significant other. No home health or DME use prior to admission. Patient is agreeable to SA resources so CSW placed on her AVS. No further concerns. CSW encouraged patient to contact CSW as needed. CSW will continue to follow patient for support and facilitate return home once stable. Patient is unsure how she will be getting home at discharge. Her significant other does not have a vehicle and other friends/family live in [place].          Expected Discharge Plan: Home/Self Care Barriers to Discharge: Continued Medical Work up   Patient Goals and CMS Choice            Expected Discharge Plan and Services     Post Acute Care Choice: NA Living arrangements for the past 2 months: Single Family Home                                      Prior Living Arrangements/Services Living arrangements for the past 2 months: Single Family Home Lives with:: Significant Other Patient language and need for interpreter reviewed:: Yes Do you feel safe going back to the place where you live?: Yes      Need for Family Participation in Patient Care: Yes (Comment) Care giver support system in  place?: Yes (comment)   Criminal Activity/Legal Involvement Pertinent to Current Situation/Hospitalization: No - Comment as needed  Activities of Daily Living   ADL Screening (condition at time of admission) Does the patient have a NEW difficulty with bathing/dressing/toileting/self-feeding that is expected to last >3 days?: No Does the patient have a NEW difficulty with getting in/out of bed, walking, or climbing stairs that is expected to last >3 days?: No Does the patient have a NEW difficulty with communication that is expected to last >3 days?: No Is the patient deaf or have difficulty hearing?: No Does the patient have difficulty seeing, even when wearing glasses/contacts?: No Does the patient have difficulty concentrating, remembering, or making decisions?: No  Permission Sought/Granted                  Emotional Assessment Appearance:: Appears stated age Attitude/Demeanor/Rapport: Engaged, Gracious Affect (typically observed): Accepting, Appropriate, Calm, Pleasant Orientation: : Oriented to Self, Oriented to Place, Oriented to  Time, Oriented to Situation Alcohol / Substance Use: Alcohol Use Psych Involvement: No (comment)  Admission diagnosis:  DKA (diabetic ketoacidosis) (HCC) [E11.10] Urinary tract infection without hematuria, site unspecified [N39.0] Diabetic ketoacidosis without coma associated with type 2 diabetes mellitus (HCC) [E11.10] Patient Active Problem List   Diagnosis Date Noted   Nausea, vomiting and diarrhea 08/21/2022   Chronic kidney disease, stage 3a (HCC) 08/21/2022  Polysubstance abuse (HCC) 08/21/2022   Anxiety and depression 08/21/2022   UTI (urinary tract infection) 08/21/2022   Severe sepsis (HCC) 03/13/2022   SIRS (systemic inflammatory response syndrome) (HCC) 01/11/2021   Hypotension 01/11/2021   Abdominal pain 01/11/2021   Chest pain 01/11/2021   HLD (hyperlipidemia) 01/11/2021   Asthma 01/11/2021   GERD (gastroesophageal reflux  disease) 01/11/2021   Hyperkalemia 01/11/2021   Alcohol abuse 01/11/2021   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   Acute metabolic encephalopathy 01/11/2021   Cocaine abuse (HCC)    Acute respiratory distress    SVT (supraventricular tachycardia)    Leukocytosis    Hypokalemia    Hypophosphatemia    DKA (diabetic ketoacidosis) (HCC) 08/27/2020   Tobacco abuse 10/04/2019   DKA, type 2 (HCC) 10/02/2019   Essential hypertension 10/02/2019   Bipolar disorder (HCC) 01/17/2019   Acute pancreatitis 05/15/2016   Acute renal failure superimposed on stage 3a chronic kidney disease (HCC)    Diabetic ketoacidosis without coma associated with type 2 diabetes mellitus (HCC)    Hyperglycemia due to type 2 diabetes mellitus (HCC) 01/13/2016   Acute UTI 01/13/2016   DKA (diabetic ketoacidoses) 01/08/2016   Hyponatremia 01/08/2016   Dehydration 01/08/2016   Urinary tract infection 01/08/2016   Upper abdominal pain 01/08/2016   Nausea & vomiting 01/08/2016   PCP:  [facility] Pharmacy:   [facility] [address] ([address]), [place] - [address] [place] ([place]) [place] [address] Phone: [phone] Fax: [phone]  [facility] [place], [place] - [address] [place] [address] Phone: [phone] Fax: [phone]  [facility] - [place], [place] - [address] [place] [address] Phone: [phone] Fax: [phone]  [facility] - [place], [place] - [address] [place] [address] Phone: [phone] Fax: [phone]     Social Determinants of Health (SDOH) Social History: SDOH Screenings   Food Insecurity: No Food Insecurity (04/11/2023)  Housing: Low Risk  (04/11/2023)  Transportation Needs: No Transportation Needs (04/11/2023)  Utilities: Not At Risk  (04/11/2023)  Tobacco Use: High Risk (04/11/2023)   SDOH Interventions:     Readmission Risk Interventions    04/11/2023    2:42 PM 01/14/2021   11:03 AM 10/10/2020    4:01 PM  Readmission Risk Prevention Plan  Transportation Screening Complete Complete Complete  PCP or Specialist Appt within 3-5 Days Complete  Complete  HRI or Home Care Consult   Complete  Social Work Consult for Recovery Care Planning/Counseling Complete  Complete  Palliative Care Screening Not Applicable  Not Applicable  Medication Review Oceanographer) Complete Referral to Pharmacy Complete  PCP or Specialist appointment within 3-5 days of discharge  Not Complete   PCP/Specialist Appt Not Complete comments  patient left AMA   HRI or Home Care Consult  Not Complete   HRI or Home Care Consult Pt Refusal Comments  patient is independent   SW Recovery Care/Counseling Consult  Complete   Palliative Care Screening  Not Applicable   Skilled Nursing Facility  Not Applicable

## 2023-04-11 NOTE — Assessment & Plan Note (Addendum)
Positive acute asthma flare with marked wheezing on exam Will start on trial of inhaled steroid in the setting of active DKA Escalate to systemic steroid if wheezing persists  As needed inhalers Monitor

## 2023-04-11 NOTE — Assessment & Plan Note (Signed)
Creatinine 1.26 with GFR in the upper 40s Appears near baseline Monitor

## 2023-04-11 NOTE — Assessment & Plan Note (Signed)
BP stable Titrate home regimen 

## 2023-04-11 NOTE — Assessment & Plan Note (Signed)
Patient self admits to marijuana and crack cocaine use recently Urine drug screen pending Withdrawal protocol initiated Patient requesting rehab resources [facility] consulted Follow

## 2023-04-11 NOTE — H&P (Addendum)
History and Physical    Patient: Pamela Lane ZOX:[id number] DOB: September 28, 1960 DOA: 04/11/2023 DOS: the patient was seen and examined on 04/11/2023 PCP: [facility]  Patient coming from: Home  Chief Complaint:  Chief Complaint  Patient presents with   Hyperglycemia   HPI: Pamela Lane is a 62 y.o. female with medical history significant of asthma, polysubstance abuse, type 2 diabetes, hypertension, bipolar disorder presenting with DKA, polysubstance abuse.  Patient reports generalized malaise, shortness of breath wheezing cough over the past 1 to 2 days.  Baseline type 2 diabetes.  Blood sugars not well-controlled.  After some pressing, patient admits to marijuana and cocaine use within the past 24 hours.  Positive prior alcohol use.  No abdominal pain.  Mild nausea.  Minimal dysuria.  No focal hemiparesis or confusion. Presented to the ER afebrile, heart rate 100s, BP stable.  Satting well on room air.  White count 11.7, hemoglobin 13, platelets 300, creatinine 1.26, glucose 643, bicarb 11, T. bili 1.6, troponin negative x 2.  Beta hydroxybutyrate greater than 8, VBG with a pH of 7.13.  Chest x-ray within normal limits.  Started on DKA protocol in the ER.  Review of Systems: As mentioned in the history of present illness. All other systems reviewed and are negative. Past Medical History:  Diagnosis Date   Asthma    Clostridium difficile colitis    Diabetes mellitus without complication (HCC)    Hypertension    Mental disorder    pt reports 'I have all of them mental disorders'   Past Surgical History:  Procedure Laterality Date   ABDOMINAL HYSTERECTOMY     Social History:  reports that she has been smoking cigarettes. She has never used smokeless tobacco. She reports that she does not currently use alcohol after a past usage of about 40.0 standard drinks of alcohol per week. She reports current drug use. Drugs: Marijuana and Cocaine.  No Known  Allergies  Family History  Problem Relation Age of Onset   Hypertension Mother     Prior to Admission medications   Medication Sig Start Date End Date Taking? Authorizing Provider  ALLERGY RELIEF/INDOOR/OUTDOOR 10 MG tablet Take 10 mg by mouth daily. 12/17/21   [provider]  atorvastatin (LIPITOR) 40 MG tablet TAKE 1 TABLET BY MOUTH AT BEDTIME. Patient taking differently: Take 40 mg by mouth at bedtime. 08/29/20 03/13/22  Darlin Drop, DO  buPROPion (WELLBUTRIN SR) 150 MG 12 hr tablet Take 150 mg by mouth 2 (two) times daily. 10/01/20   [provider]  DILT-XR 120 MG 24 hr capsule Take 120 mg by mouth daily. 12/17/21   [provider]  folic acid (FOLVITE) 1 MG tablet Take 1 mg by mouth daily. 12/17/21   [provider]  gabapentin (NEURONTIN) 300 MG capsule Take 300 mg by mouth 2 (two) times daily. 12/17/21   [provider]  hydrOXYzine (VISTARIL) 50 MG capsule Take 50 mg by mouth 2 (two) times daily as needed. 12/17/21   [provider]  insulin glargine (LANTUS) 100 UNIT/ML Solostar Pen Inject 25 Units into the skin 2 (two) times daily. 08/22/22 01/31/23  Esaw Grandchild A, DO  JANUVIA 100 MG tablet Take 100 mg by mouth daily. 12/17/21   [provider]  lisinopril (ZESTRIL) 10 MG tablet Take 10 mg by mouth daily. 10/01/20   [provider]  Multiple Vitamins-Minerals (DAILY MULTIVITAMIN) CAPS Take 1 tablet by mouth daily. 12/17/21   [provider]  nicotine (NICODERM CQ - DOSED IN MG/24 HOURS) 21 mg/24hr patch Place 1 patch (21 mg total) onto the skin daily. 11/18/20   Sreenath, Sudheer B, MD  NOVOLOG FLEXPEN 100 UNIT/ML FlexPen Inject 15 Units into the skin 3 (three) times daily with meals. 03/15/22 03/15/23  Lurene Shadow, MD  omeprazole (PRILOSEC) 20 MG capsule Take 20 mg by mouth daily.    [provider]  vitamin B-12 (CYANOCOBALAMIN) 500 MCG tablet Take 500 mcg by mouth daily.    [provider]   XIGDUO XR 04-999 MG TB24 Take 1 tablet by mouth daily. 12/17/21   [provider]    Physical Exam: Vitals:   04/11/23 0324 04/11/23 0720 04/11/23 0730 04/11/23 0800  BP:   (!) 160/67 (!) 135/110  Pulse:  (!) 121  (!) 109  Resp:  (!) 29 (!) 29 (!) 22  Temp:    98.4 F (36.9 C)  TempSrc:    Oral  SpO2:  100%  100%  Weight: 76.2 kg   72.4 kg  Height: 5\' 3"  (1.6 m)   5\' 3"  (1.6 m)   Physical Exam Constitutional:      Appearance: She is obese.  HENT:     Head: Normocephalic and atraumatic.     Nose: Nose normal.     Mouth/Throat:     Mouth: Mucous membranes are moist.  Eyes:     Pupils: Pupils are equal, round, and reactive to light.  Cardiovascular:     Rate and Rhythm: Normal rate and regular rhythm.  Pulmonary:     Effort: Pulmonary effort is normal.     Breath sounds: Wheezing present.  Abdominal:     General: Bowel sounds are normal.  Musculoskeletal:        General: Normal range of motion.  Skin:    General: Skin is warm.  Neurological:     General: No focal deficit present.  Psychiatric:        Mood and Affect: Mood normal.     Data Reviewed:  There are no new results to review at this time.  DG Chest 1 View CLINICAL DATA:  Chest pain.  Hyperglycemia.  Alcohol use.  EXAM: CHEST  1 VIEW  COMPARISON:  02/13/2023  FINDINGS: The heart size and mediastinal contours are within normal limits. Both lungs are clear. The visualized skeletal structures are unremarkable.  IMPRESSION: No active disease.  Electronically Signed   By: Burman Nieves M.D.   On: 04/11/2023 03:41  Lab Results  Component Value Date   WBC 11.7 (H) 04/11/2023   HGB 12.9 04/11/2023   HCT 39.2 04/11/2023   MCV 91.6 04/11/2023   PLT 300 04/11/2023   Last metabolic panel Lab Results  Component Value Date   GLUCOSE 643 (HH) 04/11/2023   NA 134 (L) 04/11/2023   K 5.1 04/11/2023   CL 100 04/11/2023   CO2 11 (L) 04/11/2023   BUN 20 04/11/2023   CREATININE 1.26 (H)  04/11/2023   GFRNONAA 49 (L) 04/11/2023   CALCIUM 8.5 (L) 04/11/2023   PHOS 1.5 (L) 03/15/2022   PROT 7.5 04/11/2023   ALBUMIN 3.5 04/11/2023   BILITOT 1.6 (H) 04/11/2023   ALKPHOS 113 04/11/2023   AST 12 (L) 04/11/2023   ALT 14 04/11/2023   ANIONGAP 23 (H) 04/11/2023    Assessment and Plan: * DKA (diabetic ketoacidosis) (HCC) Meeting DKA criteria with blood sugar in the 600s, bicarb of 11, pH of 7.13 on VBG BHB of 8 Positive urine ketones  Baseline poor compliance and polysubstance abuse likely major confounders Started on DKA protocol in the ER-will continue Monitor  Essential hypertension BP stable Titrate home regimen  Polysubstance abuse (HCC) Patient self admits to marijuana and crack cocaine use recently Urine drug screen pending Withdrawal protocol initiated Patient requesting rehab resources [facility] consulted Follow  Alcohol abuse Patient reports regular alcohol use though does not fully specify CIWA protocol Monitor  Asthma Positive acute asthma flare with marked wheezing on exam Will start on trial of inhaled steroid in the setting of active DKA Escalate to systemic steroid if wheezing persists  As needed inhalers Monitor  Chronic kidney disease, stage 3a (HCC) Creatinine 1.26 with GFR in the upper 40s Appears near baseline Monitor  UTI (urinary tract infection) UA indicative of infection in setting of DKA and malaise  IV rocephin  Urine culture  Follow   GERD (gastroesophageal reflux disease) PPI      Advance Care Planning:   Code Status: Full Code   Consults: None   Family Communication: No family at the bedside   Severity of Illness: The appropriate patient status for this patient is INPATIENT. Inpatient status is judged to be reasonable and necessary in order to provide the required intensity of service to ensure the patient's safety. The patient's presenting symptoms, physical exam findings, and initial radiographic and laboratory data  in the context of their chronic comorbidities is felt to place them at high risk for further clinical deterioration. Furthermore, it is not anticipated that the patient will be medically stable for discharge from the hospital within 2 midnights of admission.   * I certify that at the point of admission it is my clinical judgment that the patient will require inpatient hospital care spanning beyond 2 midnights from the point of admission due to high intensity of service, high risk for further deterioration and high frequency of surveillance required.*  Author: Floydene Flock, MD 04/11/2023 8:35 AM  For on call review [email or web address].

## 2023-04-12 DIAGNOSIS — E1110 Type 2 diabetes mellitus with ketoacidosis without coma: Secondary | ICD-10-CM | POA: Diagnosis not present

## 2023-04-12 LAB — CBC
HCT: 36.9 % (ref 36.0–46.0)
Hemoglobin: 12.7 g/dL (ref 12.0–15.0)
MCH: 30 pg (ref 26.0–34.0)
MCHC: 34.4 g/dL (ref 30.0–36.0)
MCV: 87.2 fL (ref 80.0–100.0)
Platelets: 252 10*3/uL (ref 150–400)
RBC: 4.23 MIL/uL (ref 3.87–5.11)
RDW: 12.6 % (ref 11.5–15.5)
WBC: 6.4 10*3/uL (ref 4.0–10.5)
nRBC: 0 % (ref 0.0–0.2)

## 2023-04-12 LAB — BASIC METABOLIC PANEL
Anion gap: 9 (ref 5–15)
BUN: 10 mg/dL (ref 8–23)
CO2: 20 mmol/L — ABNORMAL LOW (ref 22–32)
Calcium: 8.8 mg/dL — ABNORMAL LOW (ref 8.9–10.3)
Chloride: 102 mmol/L (ref 98–111)
Creatinine, Ser: 0.77 mg/dL (ref 0.44–1.00)
GFR, Estimated: >60 mL/min (ref >60)
Glucose, Bld: 364 mg/dL — ABNORMAL HIGH (ref 70–99)
Potassium: 4.3 mmol/L (ref 3.5–5.1)
Sodium: 131 mmol/L — ABNORMAL LOW (ref 135–145)

## 2023-04-12 LAB — URINE CULTURE: Culture: >=100000 — AB

## 2023-04-12 LAB — HIV ANTIBODY (ROUTINE TESTING W REFLEX): HIV Screen 4th Generation wRfx: NONREACTIVE

## 2023-04-12 LAB — MAGNESIUM: Magnesium: 1.9 mg/dL (ref 1.7–2.4)

## 2023-04-12 LAB — HEMOGLOBIN A1C
Hgb A1c MFr Bld: >15.5 % — ABNORMAL HIGH (ref 4.8–5.6)
Mean Plasma Glucose: >398 mg/dL

## 2023-04-12 LAB — GLUCOSE, CAPILLARY
Glucose-Capillary: 119 mg/dL — ABNORMAL HIGH (ref 70–99)
Glucose-Capillary: 320 mg/dL — ABNORMAL HIGH (ref 70–99)
Glucose-Capillary: 341 mg/dL — ABNORMAL HIGH (ref 70–99)
Glucose-Capillary: 268 mg/dL — ABNORMAL HIGH (ref 70–99)
Glucose-Capillary: 205 mg/dL — ABNORMAL HIGH (ref 70–99)

## 2023-04-12 LAB — PHOSPHORUS: Phosphorus: 1.4 mg/dL — ABNORMAL LOW (ref 2.5–4.6)

## 2023-04-12 MED ORDER — POTASSIUM & SODIUM PHOSPHATES 280-160-250 MG PO PACK
2.0000 | PACK | ORAL | Status: AC
  Administered 2023-04-12 (×3): 2 via ORAL
  Filled 2023-04-12 (×3): qty 2

## 2023-04-12 MED ORDER — INSULIN ASPART 100 UNIT/ML IJ SOLN
10.0000 [IU] | Freq: Three times a day (TID) | INTRAMUSCULAR | Status: DC
  Administered 2023-04-12 (×2): 10 [IU] via SUBCUTANEOUS
  Filled 2023-04-12 (×2): qty 1

## 2023-04-12 MED ORDER — INSULIN GLARGINE-YFGN 100 UNIT/ML ~~LOC~~ SOLN
40.0000 [IU] | Freq: Every day | SUBCUTANEOUS | Status: DC
  Administered 2023-04-12: 40 [IU] via SUBCUTANEOUS
  Filled 2023-04-12: qty 0.4

## 2023-04-12 MED ORDER — POTASSIUM CHLORIDE 10 MEQ/100ML IV SOLN: 10.0000 meq | INTRAVENOUS | Status: DC

## 2023-04-12 MED ORDER — POTASSIUM CHLORIDE 20 MEQ PO PACK
20.0000 meq | PACK | ORAL | Status: AC
  Administered 2023-04-12 (×2): 20 meq via ORAL
  Filled 2023-04-12 (×2): qty 1

## 2023-04-12 MED ORDER — LACTATED RINGERS IV SOLN: INTRAVENOUS | Status: AC

## 2023-04-12 NOTE — Progress Notes (Signed)
PROGRESS NOTE    Pamela Lane  ZOX:[id number] DOB: 01-31-1961 DOA: 04/11/2023 PCP: [facility]  [id number]  LOS: 1 day   Brief hospital course:   Assessment & Plan: Pamela Lane is a 62 y.o. female with medical history significant of asthma, polysubstance abuse, type 2 diabetes, hypertension, bipolar disorder presenting with DKA, polysubstance abuse.       * DKA (diabetic ketoacidosis) (HCC) Insulin non-compliance Meeting DKA criteria with blood sugar in the 600s, bicarb of 11, pH of 7.13 on VBG BHB >8 Positive urine ketones Baseline poor compliance and polysubstance abuse likely major confounders --off insulin gtt overnight, transitioned to subQ insulin. --cont MIVF for another day  DM2, poorly controlled --A1c >15.5. --glargine 40u nightly --mealtime 10u TID --ACHS and SSI  Essential hypertension --hold home BP meds due to intermittent soft BP  Polysubstance abuse (HCC) Patient self admits to marijuana and crack cocaine use recently Urine drug screen pos for cocaine  Alcohol abuse Patient reports regular alcohol use though does not fully specify CIWA protocol  Asthma --noted wheezing on admission, however, no respiratory issues the next day --cont Pulmicort neb and DuoNeb for now.  CKD 3a ruled out CKD 2 --GFR >60 at baseline  UTI (urinary tract infection) UA indicative of infection in setting of DKA and malaise thought Pamela Lane denied dysuria. --cont ceftriaxone for 3 days  Hypokalemia --monitor supplement PRN   DVT prophylaxis: Lovenox SQ Code Status: Full code  Family Communication:  Level of care: Med-Surg Dispo:   The patient is from: home Anticipated d/c is to: home Anticipated d/c date is: tomorrow   Subjective and Interval History:  Pamela Lane was off insulin gtt overnight.   Reported doing better.  Denied dysuria.  Pamela Lane reported diarrhea with black stool, however, such were not noted by primary  RN.   Objective: Vitals:   04/12/23 0800 04/12/23 0900 04/12/23 1100 04/12/23 1210  BP: 108/80 110/84 112/80 (!) 150/85  Pulse: 90 (!) 107 94 96  Resp: 17 18 18 15   Temp:  98.6 F (37 C) 98.7 F (37.1 C) 98.8 F (37.1 C)  TempSrc:   Oral Oral  SpO2: 100% 98% 100% 98%  Weight:      Height:        Intake/Output Summary (Last 24 hours) at 04/12/2023 1629 Last data filed at 04/12/2023 1506 Gross per 24 hour  Intake 2049.2 ml  Output 200 ml  Net 1849.2 ml   Filed Weights   04/11/23 0324 04/11/23 0800  Weight: 76.2 kg 72.4 kg    Examination:   Constitutional: NAD, AAOx3 HEENT: conjunctivae and lids normal, EOMI CV: No cyanosis.   RESP: normal respiratory effort, on RA Neuro: II - XII grossly intact.   Psych: Normal mood and affect.  Appropriate judgement and reason   Data Reviewed: I have personally reviewed labs and imaging studies  Time spent: 50 minutes  Darlin Priestly, MD [facility] If 7PM-7AM, please contact night-coverage 04/12/2023, 4:29 PM

## 2023-04-12 NOTE — Consult Note (Signed)
PHARMACY CONSULT NOTE - ELECTROLYTES  Pharmacy Consult for Electrolyte Monitoring and Replacement   Recent Labs: Height: 5\' 3"  (160 cm) Weight: 72.4 kg (159 lb 9.8 oz) IBW/kg (Calculated) : 52.4 Estimated Creatinine Clearance: 70.4 mL/min (by C-G formula based on SCr of 0.77 mg/dL). Potassium (mmol/L)  Date Value  04/12/2023 4.3   Magnesium (mg/dL)  Date Value  08/65/7846 1.9   Calcium (mg/dL)  Date Value  96/29/5284 8.8 (L)   Albumin (g/dL)  Date Value  13/24/4010 3.5   Phosphorus (mg/dL)  Date Value  27/25/3664 1.4 (L)   Sodium (mmol/L)  Date Value  04/12/2023 131 (L)    Assessment  Pamela Lane is a 62 y.o. female presenting with hyperglycemia. PMH significant forasthma, polysubstance abuse, type 2 diabetes, hypertension, bipolar disorder. Pharmacy has been consulted to monitor and replace electrolytes.  Diet: heart/healthy MIVF: LR @ 100 mL/hr  Goal of Therapy: Electrolytes WNL  Plan:  Phos 1.4, Na 131: Phos-Nak 2 packets x 3 doses(patient refusing IV replacement) Check BMP, Mg, Phos with AM labs  Thank you for allowing pharmacy to be a part of this patient's care.  Bettey Costa, PharmD Clinical Pharmacist 04/12/2023 12:08 PM

## 2023-04-13 DIAGNOSIS — E1110 Type 2 diabetes mellitus with ketoacidosis without coma: Secondary | ICD-10-CM | POA: Diagnosis not present

## 2023-04-13 LAB — CBC
HCT: 33.2 % — ABNORMAL LOW (ref 36.0–46.0)
Hemoglobin: 11.6 g/dL — ABNORMAL LOW (ref 12.0–15.0)
MCH: 30.1 pg (ref 26.0–34.0)
MCHC: 34.9 g/dL (ref 30.0–36.0)
MCV: 86 fL (ref 80.0–100.0)
Platelets: 220 10*3/uL (ref 150–400)
RBC: 3.86 MIL/uL — ABNORMAL LOW (ref 3.87–5.11)
RDW: 12.2 % (ref 11.5–15.5)
WBC: 4.1 10*3/uL (ref 4.0–10.5)
nRBC: 0 % (ref 0.0–0.2)

## 2023-04-13 LAB — BASIC METABOLIC PANEL
Anion gap: 6 (ref 5–15)
BUN: 11 mg/dL (ref 8–23)
CO2: 22 mmol/L (ref 22–32)
Calcium: 8.1 mg/dL — ABNORMAL LOW (ref 8.9–10.3)
Chloride: 105 mmol/L (ref 98–111)
Creatinine, Ser: 0.69 mg/dL (ref 0.44–1.00)
GFR, Estimated: >60 mL/min (ref >60)
Glucose, Bld: 319 mg/dL — ABNORMAL HIGH (ref 70–99)
Potassium: 3.5 mmol/L (ref 3.5–5.1)
Sodium: 133 mmol/L — ABNORMAL LOW (ref 135–145)

## 2023-04-13 LAB — GLUCOSE, CAPILLARY
Glucose-Capillary: 324 mg/dL — ABNORMAL HIGH (ref 70–99)
Glucose-Capillary: 215 mg/dL — ABNORMAL HIGH (ref 70–99)

## 2023-04-13 LAB — MAGNESIUM: Magnesium: 1.8 mg/dL (ref 1.7–2.4)

## 2023-04-13 MED ORDER — INSULIN GLARGINE 100 UNIT/ML SOLOSTAR PEN: 25.0000 [IU] | PEN_INJECTOR | Freq: Two times a day (BID) | SUBCUTANEOUS | 0 refills | Status: DC

## 2023-04-13 MED ORDER — DILT-XR 120 MG PO CP24: 120.0000 mg | ORAL_CAPSULE | Freq: Every day | ORAL | 0 refills | Status: DC

## 2023-04-13 MED ORDER — NOVOLOG FLEXPEN 100 UNIT/ML ~~LOC~~ SOPN: 15.0000 [IU] | PEN_INJECTOR | Freq: Three times a day (TID) | SUBCUTANEOUS | 0 refills | Status: DC

## 2023-04-13 MED ORDER — SITAGLIPTIN PHOSPHATE 100 MG PO TABS: 100.0000 mg | ORAL_TABLET | Freq: Every day | ORAL | 0 refills | Status: DC

## 2023-04-13 MED ORDER — INSULIN PEN NEEDLE 30G X 8 MM MISC: 0 refills | Status: DC

## 2023-04-13 MED ORDER — LISINOPRIL 10 MG PO TABS: 10.0000 mg | ORAL_TABLET | Freq: Every day | ORAL | 0 refills | Status: DC

## 2023-04-13 MED ORDER — CEFDINIR 300 MG PO CAPS: 300.0000 mg | ORAL_CAPSULE | Freq: Two times a day (BID) | ORAL | 0 refills | Status: AC

## 2023-04-13 MED ORDER — INSULIN ASPART 100 UNIT/ML IJ SOLN
15.0000 [IU] | Freq: Three times a day (TID) | INTRAMUSCULAR | Status: DC
  Administered 2023-04-13 (×2): 15 [IU] via SUBCUTANEOUS
  Filled 2023-04-13 (×2): qty 1

## 2023-04-13 MED ORDER — ATORVASTATIN CALCIUM 40 MG PO TABS: 40.0000 mg | ORAL_TABLET | Freq: Every day | ORAL | 0 refills | Status: DC

## 2023-04-13 MED ORDER — OMEPRAZOLE 20 MG PO CPDR: 20.0000 mg | DELAYED_RELEASE_CAPSULE | Freq: Every day | ORAL | 0 refills | Status: DC

## 2023-04-13 MED ORDER — INSULIN GLARGINE-YFGN 100 UNIT/ML ~~LOC~~ SOLN
50.0000 [IU] | Freq: Every day | SUBCUTANEOUS | Status: DC
  Filled 2023-04-13: qty 0.5

## 2023-04-13 MED ORDER — GABAPENTIN 300 MG PO CAPS: 300.0000 mg | ORAL_CAPSULE | Freq: Two times a day (BID) | ORAL | 0 refills | Status: DC

## 2023-04-13 MED ORDER — BUPROPION HCL ER (SR) 150 MG PO TB12: 150.0000 mg | ORAL_TABLET | Freq: Two times a day (BID) | ORAL | 0 refills | Status: DC

## 2023-04-13 MED ORDER — XIGDUO XR 10-1000 MG PO TB24: 1.0000 | ORAL_TABLET | Freq: Every day | ORAL | 0 refills | Status: AC

## 2023-04-13 NOTE — TOC Transition Note (Signed)
Transition of Care [facility]) - CM/SW Discharge Note   Patient Details  Name: Pamela Lane MRN: [id number] Date of Birth: 29-May-1961  [facility]) CM/SW Contact:  Chapman Fitch, RN Phone Number: 04/13/2023, 2:47 PM   Clinical Narrative:     Patient to discharge today Confirmed address on face sheet correct  Provided bedside RN with taxi voucher for when patient is ready for discharge Bedside RN to have patient sign rider waiver form and place in chart  Patient [facility]    Barriers to Discharge: Continued Medical Work up   Patient Goals and CMS Choice      Discharge Placement                         Discharge Plan and Services Additional resources added to the After Visit Summary for       Post Acute Care Choice: NA                               Social Determinants of Health (SDOH) Interventions SDOH Screenings   Food Insecurity: No Food Insecurity (04/11/2023)  Housing: Low Risk  (04/11/2023)  Transportation Needs: No Transportation Needs (04/11/2023)  Utilities: Not At Risk (04/11/2023)  Tobacco Use: High Risk (04/11/2023)     Readmission Risk Interventions    04/11/2023    2:42 PM 01/14/2021   11:03 AM 10/10/2020    4:01 PM  Readmission Risk Prevention Plan  Transportation Screening Complete Complete Complete  PCP or Specialist Appt within 3-5 Days Complete  Complete  HRI or Home Care Consult   Complete  Social Work Consult for Recovery Care Planning/Counseling Complete  Complete  Palliative Care Screening Not Applicable  Not Applicable  Medication Review Oceanographer) Complete Referral to Pharmacy Complete  PCP or Specialist appointment within 3-5 days of discharge  Not Complete   PCP/Specialist Appt Not Complete comments  patient left AMA   HRI or Home Care Consult  Not Complete   HRI or Home Care Consult Pt Refusal Comments  patient is independent   SW Recovery  Care/Counseling Consult  Complete   Palliative Care Screening  Not Applicable   Skilled Nursing Facility  Not Applicable

## 2023-04-13 NOTE — Consult Note (Signed)
PHARMACY CONSULT NOTE - ELECTROLYTES  Pharmacy Consult for Electrolyte Monitoring and Replacement   Recent Labs: Height: 5\' 3"  (160 cm) Weight: 72.4 kg (159 lb 9.8 oz) IBW/kg (Calculated) : 52.4 Estimated Creatinine Clearance: 70.4 mL/min (by C-G formula based on SCr of 0.69 mg/dL). Potassium (mmol/L)  Date Value  04/13/2023 3.5   Magnesium (mg/dL)  Date Value  34/74/2595 1.8   Calcium (mg/dL)  Date Value  63/87/5643 8.1 (L)   Albumin (g/dL)  Date Value  32/95/1884 3.5   Phosphorus (mg/dL)  Date Value  16/60/6301 1.4 (L)   Sodium (mmol/L)  Date Value  04/13/2023 133 (L)    Assessment  Pamela Lane is a 62 y.o. female presenting with hyperglycemia. PMH significant forasthma, polysubstance abuse, type 2 diabetes, hypertension, bipolar disorder. Pharmacy has been consulted to monitor and replace electrolytes.  Goal of Therapy: Electrolytes WNL  Plan:  No electrolyte replacement warranted for today Check BMP, Mg, Phos with AM labs  Thank you for allowing pharmacy to be a part of this patient's care.  [name], PharmD Clinical Pharmacist 04/13/2023 7:18 AM

## 2023-04-13 NOTE — Inpatient Diabetes Management (Signed)
Inpatient Diabetes Program Recommendations  AACE/ADA: New Consensus Statement on Inpatient Glycemic Control (2015)  Target Ranges:  Prepandial:   less than 140 mg/dL      Peak postprandial:   less than 180 mg/dL (1-2 hours)      Critically ill patients:  140 - 180 mg/dL   Lab Results  Component Value Date   GLUCAP 324 (H) 04/13/2023   HGBA1C >15.5 (H) 04/11/2023    Latest Reference Range & Units 04/12/23 07:37 04/12/23 11:45 04/12/23 17:36 04/12/23 21:07 04/13/23 08:32  Glucose-Capillary 70 - 99 mg/dL 161 (H) 096 (H) 045 (H) 205 (H) 324 (H)  (H): Data is abnormally high  History: DM2   Home DM Meds: Lantus 25 units BID                             Januvia 100 mg Daily                             Novolog 15 units TID with meals                             Xigduo 04/999 mg Daily Current orders for Inpatient glycemic control: Semglee 40 units hs, Novolog 10 units tid meal coverage, Novolog 0-15 units tid, 0-5 units hs correction  Inpatient Diabetes Program Recommendations:   Please consider: -Increase Semglee to 50 units hs -Increase Novolog meal coverage to 15 units tid if eats 50% meals  Thank you, Pamela Lane. Hazely Sealey, RN, MSN, CDE  Diabetes Coordinator Inpatient Glycemic Control Team Team Pager [phone] (8am-5pm) 04/13/2023 8:40 AM

## 2023-04-13 NOTE — TOC Progression Note (Signed)
Transition of Care [facility]) - Progression Note    Patient Details  Name: Pamela Lane MRN: [id number] Date of Birth: 1960/08/08  [facility] of [facility]) CM/SW Contact  Chapman Fitch, RN Phone Number: 04/13/2023, 9:57 AM  Clinical Narrative:    MD and pharmacist aware that patient reports she is out of her home medications   Expected Discharge Plan: Home/Self Care Barriers to Discharge: Continued Medical Work up  Expected Discharge Plan and Services     Post Acute Care Choice: NA Living arrangements for the past 2 months: Single Family Home                                       Social Determinants of Health (SDOH) Interventions SDOH Screenings   Food Insecurity: No Food Insecurity (04/11/2023)  Housing: Low Risk  (04/11/2023)  Transportation Needs: No Transportation Needs (04/11/2023)  Utilities: Not At Risk (04/11/2023)  Tobacco Use: High Risk (04/11/2023)    Readmission Risk Interventions    04/11/2023    2:42 PM 01/14/2021   11:03 AM 10/10/2020    4:01 PM  Readmission Risk Prevention Plan  Transportation Screening Complete Complete Complete  PCP or Specialist Appt within 3-5 Days Complete  Complete  HRI or Home Care Consult   Complete  Social Work Consult for Recovery Care Planning/Counseling Complete  Complete  Palliative Care Screening Not Applicable  Not Applicable  Medication Review Oceanographer) Complete Referral to Pharmacy Complete  PCP or Specialist appointment within 3-5 days of discharge  Not Complete   PCP/Specialist Appt Not Complete comments  patient left AMA   HRI or Home Care Consult  Not Complete   HRI or Home Care Consult Pt Refusal Comments  patient is independent   SW Recovery Care/Counseling Consult  Complete   Palliative Care Screening  Not Applicable   Skilled Nursing Facility  Not Applicable

## 2023-04-13 NOTE — Discharge Summary (Signed)
Physician Discharge Summary  Pamela Lane ZOX:[id number] DOB: May 04, 1961 DOA: 04/11/2023  PCP: [facility]  Admit date: 04/11/2023 Discharge date: 04/13/2023  Admitted From: home  Disposition:  home   Recommendations for Outpatient Follow-up:  Follow up with PCP in 1 week   Home Health: no Equipment/Devices:  Discharge Condition: stable  CODE STATUS: full  Diet recommendation: Heart Healthy / Carb Modified   Brief/Interim Summary: HPI was taken from Dr. Alvester Morin:  Pamela Lane is a 62 y.o. female with medical history significant of asthma, polysubstance abuse, type 2 diabetes, hypertension, bipolar disorder presenting with DKA, polysubstance abuse.  Patient reports generalized malaise, shortness of breath wheezing cough over the past 1 to 2 days.  Baseline type 2 diabetes.  Blood sugars not well-controlled.  After some pressing, patient admits to marijuana and cocaine use within the past 24 hours.  Positive prior alcohol use.  No abdominal pain.  Mild nausea.  Minimal dysuria.  No focal hemiparesis or confusion. Presented to the ER afebrile, heart rate 100s, BP stable.  Satting well on room air.  White count 11.7, hemoglobin 13, platelets 300, creatinine 1.26, glucose 643, bicarb 11, T. bili 1.6, troponin negative x 2.  Beta hydroxybutyrate greater than 8, VBG with a pH of 7.13.  Chest x-ray within normal limits.  Started on DKA protocol in the ER.  Discharge Diagnoses:  Principal Problem:   DKA (diabetic ketoacidosis) (HCC) Active Problems:   Essential hypertension   Alcohol abuse   Polysubstance abuse (HCC)   Asthma   Chronic kidney disease, stage 3a (HCC)   UTI (urinary tract infection)   GERD (gastroesophageal reflux disease)  DKA: secondary to insulin non-compliance. Weaned off of insulin drip. Resolved.    DM2: poorly controlled. HbA1c >15.5. Continue on glargine, SSI w/ accuchecks    HTN: continue on home dose of lisinopril    Polysubstance  abuse: admits to using marijuana, crack cocaine. Urine drug screen was positive for cocaine. Received illicit drug use cessation counseling    Alcohol abuse: continue on CIWA protocol. Received alcohol cessation counseling   Asthma: unknown severity and/or stage. Bronchodilators prn   CKDII: Cr is at baseline    UTI: urine cx growing group B strep. Continue on IV rocephin inpatient and d/c on po cefdinir to complete the course   Hypokalemia: WNL today     Discharge Instructions  Discharge Instructions     Diet - low sodium heart healthy   Complete by: As directed    Discharge instructions   Complete by: As directed    F/u w/ PCP in 1 week   Increase activity slowly   Complete by: As directed       Allergies as of 04/13/2023   No Known Allergies      Medication List     TAKE these medications    Allergy Relief/Indoor/Outdoor 10 MG tablet Generic drug: cetirizine Take 10 mg by mouth daily.   atorvastatin 40 MG tablet Commonly known as: LIPITOR TAKE 1 TABLET BY MOUTH AT BEDTIME.   buPROPion 150 MG 12 hr tablet Commonly known as: WELLBUTRIN SR Take 1 tablet (150 mg total) by mouth 2 (two) times daily.   cefdinir 300 MG capsule Commonly known as: OMNICEF Take 1 capsule (300 mg total) by mouth 2 (two) times daily for 3 days.   Daily Multivitamin Caps Take 1 tablet by mouth daily.   Dilt-XR 120 MG 24 hr capsule Generic drug: diltiazem Take 1 capsule (120 mg total)  by mouth daily.   folic acid 1 MG tablet Commonly known as: FOLVITE Take 1 mg by mouth daily.   gabapentin 300 MG capsule Commonly known as: NEURONTIN Take 1 capsule (300 mg total) by mouth 2 (two) times daily.   hydrOXYzine 50 MG capsule Commonly known as: VISTARIL Take 50 mg by mouth 2 (two) times daily as needed.   insulin glargine 100 UNIT/ML Solostar Pen Commonly known as: LANTUS Inject 25 Units into the skin 2 (two) times daily.   Insulin Pen Needle 30G X 8 MM Misc Commonly known  as: NOVOFINE Enough insulin pen needles for insulin glargine 25 units BID & novolog 15 units TID x 30 days. No refills   lisinopril 10 MG tablet Commonly known as: ZESTRIL Take 1 tablet (10 mg total) by mouth daily.   nicotine 21 mg/24hr patch Commonly known as: NICODERM CQ - dosed in mg/24 hours Place 1 patch (21 mg total) onto the skin daily.   NovoLOG FlexPen 100 UNIT/ML FlexPen Generic drug: insulin aspart Inject 15 Units into the skin 3 (three) times daily with meals.   omeprazole 20 MG capsule Commonly known as: PRILOSEC Take 1 capsule (20 mg total) by mouth daily.   sitaGLIPtin 100 MG tablet Commonly known as: Januvia Take 1 tablet (100 mg total) by mouth daily.   vitamin B-12 500 MCG tablet Commonly known as: CYANOCOBALAMIN Take 500 mcg by mouth daily.   Xigduo XR 04-999 MG Tb24 Generic drug: Dapagliflozin Pro-metFORMIN ER Take 1 tablet by mouth daily.        No Known Allergies  Consultations:    Procedures/Studies: DG Chest 1 View  Result Date: 04/11/2023 CLINICAL DATA:  Chest pain.  Hyperglycemia.  Alcohol use. EXAM: CHEST  1 VIEW COMPARISON:  02/13/2023 FINDINGS: The heart size and mediastinal contours are within normal limits. Both lungs are clear. The visualized skeletal structures are unremarkable. IMPRESSION: No active disease. Electronically Signed   By: Burman Nieves M.D.   On: 04/11/2023 03:41   (Echo, Carotid, EGD, Colonoscopy, ERCP)    Subjective: Pt c/o malaise    Discharge Exam: Vitals:   04/13/23 0728 04/13/23 0849  BP:  120/77  Pulse: 98 (!) 101  Resp: 16 18  Temp:  98.4 F (36.9 C)  SpO2: 96% 100%   Vitals:   04/12/23 1957 04/13/23 0351 04/13/23 0728 04/13/23 0849  BP: 122/75 124/76  120/77  Pulse: (!) 102 (!) 104 98 (!) 101  Resp: 18 18 16 18   Temp: 98.3 F (36.8 C) 98.3 F (36.8 C)  98.4 F (36.9 C)  TempSrc:      SpO2: 100% 100% 96% 100%  Weight:      Height:        General: Pt is alert, awake, not in acute  distress Cardiovascular:S1/S2 +, no rubs, no gallops Respiratory: CTA bilaterally, no wheezing, no rhonchi Abdominal: Soft, NT, obese, bowel sounds + Extremities: no cyanosis    The results of significant diagnostics from this hospitalization (including imaging, microbiology, ancillary and laboratory) are listed below for reference.     Microbiology: Recent Results (from the past 240 hour(s))  Urine Culture     Status: Abnormal   Collection Time: 04/11/23  3:36 AM   Specimen: Urine, Clean Catch  Result Value Ref Range Status   Specimen Description   Final    URINE, CLEAN CATCH Performed at [facility], [address], [place], [place] [address]    Special Requests   Final  NONE Performed at [facility], [address] [place], [place] [address]    Culture (A)  Final    >=100,000 COLONIES/mL GROUP B STREP(S.AGALACTIAE)ISOLATED TESTING AGAINST S. AGALACTIAE NOT ROUTINELY PERFORMED DUE TO PREDICTABILITY OF AMP/PEN/VAN SUSCEPTIBILITY. Performed at [facility], [address] [place], [place] [address]    Report Status 04/12/2023 FINAL  Final  MRSA Next Gen by PCR, Nasal     Status: None   Collection Time: 04/11/23  8:40 AM   Specimen: Nasal Mucosa; Nasal Swab  Result Value Ref Range Status   MRSA by PCR Next Gen NOT DETECTED NOT DETECTED Final    Comment: (NOTE) The GeneXpert MRSA Assay (FDA approved for NASAL specimens only), is one component of a comprehensive MRSA colonization surveillance program. It is not intended to diagnose MRSA infection nor to guide or monitor treatment for MRSA infections. Test performance is not FDA approved in patients less than 31 years old. Performed at [facility], [address] [place], [place] [address]      Labs: BNP (last 3 results) No results for input(s): "BNP" in the last 8760 hours. Basic Metabolic Panel: Recent Labs  Lab 04/11/23 1414 04/11/23 1813 04/11/23 2158 04/12/23 0923  04/13/23 0519  NA 130* 133* 134* 131* 133*  K 3.3* 4.0 3.3* 4.3 3.5  CL 103 103 106 102 105  CO2 16* 21* 22 20* 22  GLUCOSE 237* 248* 156* 364* 319*  BUN 15 16 14 10 11   CREATININE 0.81 0.69 0.63 0.77 0.69  CALCIUM 8.6* 8.5* 8.7* 8.8* 8.1*  MG  --   --   --  1.9 1.8  PHOS  --   --   --  1.4*  --    Liver Function Tests: Recent Labs  Lab 04/11/23 0328  AST 12*  ALT 14  ALKPHOS 113  BILITOT 1.6*  PROT 7.5  ALBUMIN 3.5   No results for input(s): "LIPASE", "AMYLASE" in the last 168 hours. No results for input(s): "AMMONIA" in the last 168 hours. CBC: Recent Labs  Lab 04/11/23 0328 04/12/23 0923 04/13/23 0519  WBC 11.7* 6.4 4.1  HGB 12.9 12.7 11.6*  HCT 39.2 36.9 33.2*  MCV 91.6 87.2 86.0  PLT 300 252 220   Cardiac Enzymes: No results for input(s): "CKTOTAL", "CKMB", "CKMBINDEX", "TROPONINI" in the last 168 hours. BNP: Invalid input(s): "POCBNP" CBG: Recent Labs  Lab 04/12/23 1145 04/12/23 1736 04/12/23 2107 04/13/23 0832 04/13/23 1144  GLUCAP 341* 268* 205* 324* 215*   D-Dimer No results for input(s): "DDIMER" in the last 72 hours. Hgb A1c Recent Labs    04/11/23 0328  HGBA1C >15.5*   Lipid Profile No results for input(s): "CHOL", "HDL", "LDLCALC", "TRIG", "CHOLHDL", "LDLDIRECT" in the last 72 hours. Thyroid function studies No results for input(s): "TSH", "T4TOTAL", "T3FREE", "THYROIDAB" in the last 72 hours.  Invalid input(s): "FREET3" Anemia work up No results for input(s): "VITAMINB12", "FOLATE", "FERRITIN", "TIBC", "IRON", "RETICCTPCT" in the last 72 hours. Urinalysis    Component Value Date/Time   COLORURINE STRAW (A) 04/11/2023 0336   APPEARANCEUR HAZY (A) 04/11/2023 0336   LABSPEC 1.022 04/11/2023 0336   PHURINE 6.0 04/11/2023 0336   GLUCOSEU >=500 (A) 04/11/2023 0336   HGBUR SMALL (A) 04/11/2023 0336   BILIRUBINUR NEGATIVE 04/11/2023 0336   KETONESUR 80 (A) 04/11/2023 0336   PROTEINUR NEGATIVE 04/11/2023 0336   NITRITE NEGATIVE  04/11/2023 0336   LEUKOCYTESUR LARGE (A) 04/11/2023 0336   Sepsis Labs Recent Labs  Lab 04/11/23  0865 04/12/23 0923 04/13/23 0519  WBC 11.7* 6.4 4.1   Microbiology Recent Results (from the past 240 hour(s))  Urine Culture     Status: Abnormal   Collection Time: 04/11/23  3:36 AM   Specimen: Urine, Clean Catch  Result Value Ref Range Status   Specimen Description   Final    URINE, CLEAN CATCH Performed at [facility], [address], [place], [place] [address]    Special Requests   Final    NONE Performed at [facility], [address], [place], [place] [address]    Culture (A)  Final    >=100,000 COLONIES/mL GROUP B STREP(S.AGALACTIAE)ISOLATED TESTING AGAINST S. AGALACTIAE NOT ROUTINELY PERFORMED DUE TO PREDICTABILITY OF AMP/PEN/VAN SUSCEPTIBILITY. Performed at [facility], [address], [place], [place] [address]    Report Status 04/12/2023 FINAL  Final  MRSA Next Gen by PCR, Nasal     Status: None   Collection Time: 04/11/23  8:40 AM   Specimen: Nasal Mucosa; Nasal Swab  Result Value Ref Range Status   MRSA by PCR Next Gen NOT DETECTED NOT DETECTED Final    Comment: (NOTE) The GeneXpert MRSA Assay (FDA approved for NASAL specimens only), is one component of a comprehensive MRSA colonization surveillance program. It is not intended to diagnose MRSA infection nor to guide or monitor treatment for MRSA infections. Test performance is not FDA approved in patients less than 7 years old. Performed at [facility], [address], [place], [place] [address]      Time coordinating discharge: Over 30 minutes  SIGNED:   Charise Killian, MD  [facility] 04/13/2023, 2:38 PM Pager

## 2023-07-28 ENCOUNTER — Encounter: Payer: Self-pay | Admitting: Radiology

## 2023-07-28 ENCOUNTER — Other Ambulatory Visit: Payer: Self-pay

## 2023-07-28 ENCOUNTER — Emergency Department: Payer: MEDICAID

## 2023-07-28 ENCOUNTER — Inpatient Hospital Stay
Admission: EM | Admit: 2023-07-28 | Discharge: 2023-07-30 | DRG: 314 | Disposition: A | Discharge status: Home / Self Care | Payer: MEDICAID | Attending: Internal Medicine | Admitting: Internal Medicine

## 2023-07-28 DIAGNOSIS — F1411 Cocaine abuse, in remission: Secondary | ICD-10-CM | POA: Diagnosis present

## 2023-07-28 DIAGNOSIS — E1129 Type 2 diabetes mellitus with other diabetic kidney complication: Secondary | ICD-10-CM | POA: Diagnosis present

## 2023-07-28 DIAGNOSIS — E1169 Type 2 diabetes mellitus with other specified complication: Secondary | ICD-10-CM | POA: Diagnosis not present

## 2023-07-28 DIAGNOSIS — E1110 Type 2 diabetes mellitus with ketoacidosis without coma: Secondary | ICD-10-CM | POA: Diagnosis present

## 2023-07-28 DIAGNOSIS — F319 Bipolar disorder, unspecified: Secondary | ICD-10-CM | POA: Diagnosis present

## 2023-07-28 DIAGNOSIS — E1122 Type 2 diabetes mellitus with diabetic chronic kidney disease: Secondary | ICD-10-CM | POA: Diagnosis present

## 2023-07-28 DIAGNOSIS — E119 Type 2 diabetes mellitus without complications: Secondary | ICD-10-CM

## 2023-07-28 DIAGNOSIS — E785 Hyperlipidemia, unspecified: Secondary | ICD-10-CM | POA: Diagnosis present

## 2023-07-28 DIAGNOSIS — K219 Gastro-esophageal reflux disease without esophagitis: Secondary | ICD-10-CM | POA: Diagnosis present

## 2023-07-28 DIAGNOSIS — I129 Hypertensive chronic kidney disease with stage 1 through stage 4 chronic kidney disease, or unspecified chronic kidney disease: Secondary | ICD-10-CM | POA: Diagnosis present

## 2023-07-28 DIAGNOSIS — F32A Depression, unspecified: Secondary | ICD-10-CM | POA: Diagnosis present

## 2023-07-28 DIAGNOSIS — E860 Dehydration: Secondary | ICD-10-CM | POA: Diagnosis present

## 2023-07-28 DIAGNOSIS — Z794 Long term (current) use of insulin: Secondary | ICD-10-CM | POA: Diagnosis not present

## 2023-07-28 DIAGNOSIS — Z1152 Encounter for screening for COVID-19: Secondary | ICD-10-CM | POA: Diagnosis not present

## 2023-07-28 DIAGNOSIS — R6510 Systemic inflammatory response syndrome (SIRS) of non-infectious origin without acute organ dysfunction: Secondary | ICD-10-CM | POA: Diagnosis present

## 2023-07-28 DIAGNOSIS — F17210 Nicotine dependence, cigarettes, uncomplicated: Secondary | ICD-10-CM | POA: Diagnosis present

## 2023-07-28 DIAGNOSIS — N179 Acute kidney failure, unspecified: Secondary | ICD-10-CM | POA: Diagnosis present

## 2023-07-28 DIAGNOSIS — Z79899 Other long term (current) drug therapy: Secondary | ICD-10-CM

## 2023-07-28 DIAGNOSIS — R6511 Systemic inflammatory response syndrome (SIRS) of non-infectious origin with acute organ dysfunction: Secondary | ICD-10-CM | POA: Diagnosis present

## 2023-07-28 DIAGNOSIS — F419 Anxiety disorder, unspecified: Secondary | ICD-10-CM | POA: Diagnosis present

## 2023-07-28 DIAGNOSIS — R55 Syncope and collapse: Secondary | ICD-10-CM | POA: Diagnosis not present

## 2023-07-28 DIAGNOSIS — Z8249 Family history of ischemic heart disease and other diseases of the circulatory system: Secondary | ICD-10-CM

## 2023-07-28 DIAGNOSIS — N1831 Chronic kidney disease, stage 3a: Secondary | ICD-10-CM | POA: Diagnosis present

## 2023-07-28 DIAGNOSIS — J45909 Unspecified asthma, uncomplicated: Secondary | ICD-10-CM

## 2023-07-28 DIAGNOSIS — Z720 Tobacco use: Secondary | ICD-10-CM | POA: Diagnosis present

## 2023-07-28 DIAGNOSIS — Z7984 Long term (current) use of oral hypoglycemic drugs: Secondary | ICD-10-CM

## 2023-07-28 DIAGNOSIS — I10 Essential (primary) hypertension: Secondary | ICD-10-CM | POA: Diagnosis not present

## 2023-07-28 DIAGNOSIS — I959 Hypotension, unspecified: Secondary | ICD-10-CM | POA: Diagnosis present

## 2023-07-28 DIAGNOSIS — E1121 Type 2 diabetes mellitus with diabetic nephropathy: Secondary | ICD-10-CM | POA: Diagnosis not present

## 2023-07-28 DIAGNOSIS — A419 Sepsis, unspecified organism: Secondary | ICD-10-CM | POA: Diagnosis present

## 2023-07-28 DIAGNOSIS — Z90710 Acquired absence of both cervix and uterus: Secondary | ICD-10-CM | POA: Diagnosis not present

## 2023-07-28 DIAGNOSIS — F1410 Cocaine abuse, uncomplicated: Secondary | ICD-10-CM | POA: Diagnosis present

## 2023-07-28 LAB — URINALYSIS, W/ REFLEX TO CULTURE (INFECTION SUSPECTED)
Bacteria, UA: NONE SEEN
Bilirubin Urine: NEGATIVE
Glucose, UA: >=500 mg/dL — AB
Hgb urine dipstick: NEGATIVE
Ketones, ur: 20 mg/dL — AB
Nitrite: NEGATIVE
Protein, ur: NEGATIVE mg/dL
Specific Gravity, Urine: 1.028 (ref 1.005–1.030)
pH: 6 (ref 5.0–8.0)

## 2023-07-28 LAB — URINE DRUG SCREEN, QUALITATIVE (ARMC ONLY)
Amphetamines, Ur Screen: NOT DETECTED
Barbiturates, Ur Screen: NOT DETECTED
Benzodiazepine, Ur Scrn: NOT DETECTED
Cannabinoid 50 Ng, Ur ~~LOC~~: NOT DETECTED
Cocaine Metabolite,Ur ~~LOC~~: NOT DETECTED
MDMA (Ecstasy)Ur Screen: NOT DETECTED
Methadone Scn, Ur: NOT DETECTED
Opiate, Ur Screen: NOT DETECTED
Phencyclidine (PCP) Ur S: NOT DETECTED
Tricyclic, Ur Screen: NOT DETECTED

## 2023-07-28 LAB — RESP PANEL BY RT-PCR (RSV, FLU A&B, COVID)  RVPGX2
Influenza A by PCR: NEGATIVE
Influenza B by PCR: NEGATIVE
Resp Syncytial Virus by PCR: NEGATIVE
SARS Coronavirus 2 by RT PCR: NEGATIVE

## 2023-07-28 LAB — COMPREHENSIVE METABOLIC PANEL
ALT: 13 U/L (ref 0–44)
AST: 14 U/L — ABNORMAL LOW (ref 15–41)
Albumin: 3.8 g/dL (ref 3.5–5.0)
Alkaline Phosphatase: 84 U/L (ref 38–126)
Anion gap: 14 (ref 5–15)
BUN: 33 mg/dL — ABNORMAL HIGH (ref 8–23)
CO2: 19 mmol/L — ABNORMAL LOW (ref 22–32)
Calcium: 9 mg/dL (ref 8.9–10.3)
Chloride: 104 mmol/L (ref 98–111)
Creatinine, Ser: 1.44 mg/dL — ABNORMAL HIGH (ref 0.44–1.00)
GFR, Estimated: 41 mL/min — ABNORMAL LOW (ref >60)
Glucose, Bld: 154 mg/dL — ABNORMAL HIGH (ref 70–99)
Potassium: 3.6 mmol/L (ref 3.5–5.1)
Sodium: 137 mmol/L (ref 135–145)
Total Bilirubin: 0.4 mg/dL (ref 0.0–1.2)
Total Protein: 7.5 g/dL (ref 6.5–8.1)

## 2023-07-28 LAB — CBC WITH DIFFERENTIAL/PLATELET
Abs Immature Granulocytes: 0.07 10*3/uL (ref 0.00–0.07)
Basophils Absolute: 0.1 10*3/uL (ref 0.0–0.1)
Basophils Relative: 1 %
Eosinophils Absolute: 0.1 10*3/uL (ref 0.0–0.5)
Eosinophils Relative: 1 %
HCT: 45.3 % (ref 36.0–46.0)
Hemoglobin: 15.2 g/dL — ABNORMAL HIGH (ref 12.0–15.0)
Immature Granulocytes: 1 %
Lymphocytes Relative: 17 %
Lymphs Abs: 2.2 10*3/uL (ref 0.7–4.0)
MCH: 29.7 pg (ref 26.0–34.0)
MCHC: 33.6 g/dL (ref 30.0–36.0)
MCV: 88.6 fL (ref 80.0–100.0)
Monocytes Absolute: 1.1 10*3/uL — ABNORMAL HIGH (ref 0.1–1.0)
Monocytes Relative: 8 %
Neutro Abs: 9.6 10*3/uL — ABNORMAL HIGH (ref 1.7–7.7)
Neutrophils Relative %: 72 %
Platelets: 347 10*3/uL (ref 150–400)
RBC: 5.11 MIL/uL (ref 3.87–5.11)
RDW: 13.6 % (ref 11.5–15.5)
WBC: 13.2 10*3/uL — ABNORMAL HIGH (ref 4.0–10.5)
nRBC: 0 % (ref 0.0–0.2)

## 2023-07-28 LAB — CBG MONITORING, ED: Glucose-Capillary: 595 mg/dL (ref 70–99)

## 2023-07-28 LAB — APTT: aPTT: 24 s (ref 24–36)

## 2023-07-28 LAB — PROTIME-INR
INR: 1 (ref 0.8–1.2)
Prothrombin Time: 13.6 s (ref 11.4–15.2)

## 2023-07-28 LAB — LACTIC ACID, PLASMA
Lactic Acid, Venous: 3.6 mmol/L (ref 0.5–1.9)
Lactic Acid, Venous: 2.6 mmol/L (ref 0.5–1.9)

## 2023-07-28 MED ORDER — INSULIN GLARGINE-YFGN 100 UNIT/ML ~~LOC~~ SOLN
30.0000 [IU] | Freq: Every day | SUBCUTANEOUS | Status: DC
Start: 2023-07-28 — End: 2023-07-29
  Administered 2023-07-28: 30 [IU] via SUBCUTANEOUS
  Filled 2023-07-28: qty 0.3

## 2023-07-28 MED ORDER — ENOXAPARIN SODIUM 40 MG/0.4ML IJ SOSY
40.0000 mg | PREFILLED_SYRINGE | INTRAMUSCULAR | Status: DC
  Administered 2023-07-28 – 2023-07-29 (×2): 40 mg via SUBCUTANEOUS
  Filled 2023-07-28 (×2): qty 0.4

## 2023-07-28 MED ORDER — SODIUM CHLORIDE 0.9 % IV BOLUS (SEPSIS)
2000.0000 mL | Freq: Once | INTRAVENOUS | Status: AC
  Administered 2023-07-28: 2000 mL via INTRAVENOUS

## 2023-07-28 MED ORDER — ONDANSETRON HCL 4 MG/2ML IJ SOLN
4.0000 mg | Freq: Four times a day (QID) | INTRAMUSCULAR | Status: DC | PRN
  Administered 2023-07-30: 4 mg via INTRAVENOUS
  Filled 2023-07-28: qty 2

## 2023-07-28 MED ORDER — SODIUM CHLORIDE 0.9 % IV SOLN
2.0000 g | Freq: Once | INTRAVENOUS | Status: AC
  Administered 2023-07-28: 2 g via INTRAVENOUS
  Filled 2023-07-28: qty 12.5

## 2023-07-28 MED ORDER — LACTATED RINGERS IV SOLN: INTRAVENOUS | Status: DC

## 2023-07-28 MED ORDER — METRONIDAZOLE 500 MG/100ML IV SOLN
500.0000 mg | Freq: Once | INTRAVENOUS | Status: AC
  Administered 2023-07-28: 500 mg via INTRAVENOUS
  Filled 2023-07-28: qty 100

## 2023-07-28 MED ORDER — INSULIN ASPART 100 UNIT/ML IJ SOLN: 0.0000 [IU] | Freq: Three times a day (TID) | INTRAMUSCULAR | Status: DC

## 2023-07-28 MED ORDER — SODIUM CHLORIDE 0.9 % IV SOLN
250.0000 mL | INTRAVENOUS | Status: AC
Start: 2023-07-28 — End: 2023-07-29
  Administered 2023-07-28 (×2): 250 mL via INTRAVENOUS

## 2023-07-28 MED ORDER — IOHEXOL 300 MG/ML  SOLN
80.0000 mL | Freq: Once | INTRAMUSCULAR | Status: AC | PRN
  Administered 2023-07-28: 80 mL via INTRAVENOUS

## 2023-07-28 MED ORDER — NOREPINEPHRINE 4 MG/250ML-% IV SOLN
2.0000 ug/min | INTRAVENOUS | Status: DC
  Administered 2023-07-28: 2 ug/min via INTRAVENOUS
  Filled 2023-07-28: qty 250

## 2023-07-28 MED ORDER — ONDANSETRON HCL 4 MG PO TABS: 4.0000 mg | ORAL_TABLET | Freq: Four times a day (QID) | ORAL | Status: DC | PRN

## 2023-07-28 MED ORDER — LACTATED RINGERS IV SOLN: INTRAVENOUS | Status: AC

## 2023-07-28 NOTE — H&P (Addendum)
History and Physical    Patient: Pamela Lane NWG:[id number] DOB: 05-Jun-1961 DOA: 07/28/2023 DOS: the patient was seen and examined on 07/28/2023 PCP: [facility]  Patient coming from: Home  Chief Complaint:  Chief Complaint  Patient presents with   Code Sepsis   HPI: KENNIAH Lane is a 63 y.o. female with medical history significant of asthma, polysubstance abuse, type 2 diabetes, hypertension, bipolar disorder presenting with DKA, polysubstance abuse presenting with SIRS, hypotension.  Patient reports generalized abdominal pain, nausea vomiting and intermittent diarrhea over the past 2 to 3 days.  No reported sick contacts.  No fevers or chills.  Positive worsening malaise.  Inability tolerate significant p.o. intake albeit fluid or liquid.  No focal hemiparesis or confusion.  Denies any active drug use.  Still smoking cigarettes at times.  Unclear about blood sugar management at home.  Worsening fatigue.  No dysuria or increased urinary frequency.  EMS subsequently called.  Systolic pressures were in the 70s on evaluation.  Started on Levophed and brought to the ER. Presented to the ER afebrile, heart rate 100s, initial systolic pressures in the 70s to 90s.  Was transition from low-dose Levophed to LR maintenance IV fluids with systolic pressures in the 100s.  White count 13.2, hemoglobin 15.2, platelets 347, urinalysis minimally indicative of infection.  Lactate 3.6-2.6.  Creatinine 1.44.  COVID flu and RSV negative.  Chest x-ray within normal limits.  CT of the abdomen and chest and pelvis grossly stable.  Review of Systems: As mentioned in the history of present illness. All other systems reviewed and are negative. Past Medical History:  Diagnosis Date   Asthma    Clostridium difficile colitis    Diabetes mellitus without complication (HCC)    Hypertension    Mental disorder    pt reports 'I have all of them mental disorders'   Past Surgical History:   Procedure Laterality Date   ABDOMINAL HYSTERECTOMY     Social History:  reports that she has been smoking cigarettes. She has never used smokeless tobacco. She reports that she does not currently use alcohol after a past usage of about 40.0 standard drinks of alcohol per week. She reports current drug use. Drugs: Marijuana and Cocaine.  No Known Allergies  Family History  Problem Relation Age of Onset   Hypertension Mother     Prior to Admission medications   Medication Sig Start Date End Date Taking? Authorizing Provider  atorvastatin (LIPITOR) 40 MG tablet TAKE 1 TABLET BY MOUTH AT BEDTIME. 04/13/23 07/28/23 Yes Charise Killian, MD  buPROPion [facility] SR) 150 MG 12 hr tablet Take 1 tablet (150 mg total) by mouth 2 (two) times daily. 04/13/23 07/28/23 Yes Charise Killian, MD  DILT-XR 120 MG 24 hr capsule Take 1 capsule (120 mg total) by mouth daily. 04/13/23 07/28/23 Yes Charise Killian, MD  gabapentin (NEURONTIN) 300 MG capsule Take 1 capsule (300 mg total) by mouth 2 (two) times daily. 04/13/23 07/28/23 Yes Charise Killian, MD  insulin glargine (LANTUS) 100 UNIT/ML Solostar Pen Inject 25 Units into the skin 2 (two) times daily. Patient taking differently: Inject 50 Units into the skin daily. 04/13/23 07/28/23 Yes Charise Killian, MD  lisinopril (ZESTRIL) 10 MG tablet Take 1 tablet (10 mg total) by mouth daily. 04/13/23 07/28/23 Yes Charise Killian, MD  NOVOLOG FLEXPEN 100 UNIT/ML FlexPen Inject 15 Units into the skin 3 (three) times daily with meals. Patient taking differently: Inject 15 Units into the  skin every other day. 04/13/23 07/28/23 Yes Charise Killian, MD  omeprazole (PRILOSEC) 20 MG capsule Take 1 capsule (20 mg total) by mouth daily. 04/13/23 07/28/23 Yes Charise Killian, MD  sitaGLIPtin (JANUVIA) 100 MG tablet Take 1 tablet (100 mg total) by mouth daily. 04/13/23 07/28/23 Yes Charise Killian, MD  vitamin B-12 (CYANOCOBALAMIN) 500 MCG tablet Take 500 mcg  by mouth daily.   Yes [provider]  Insulin Pen Needle (NOVOFINE) 30G X 8 MM MISC Enough insulin pen needles for insulin glargine 25 units BID & novolog 15 units TID x 30 days. No refills 04/13/23   Charise Killian, MD    Physical Exam: Vitals:   07/28/23 1500 07/28/23 1600 07/28/23 1630 07/28/23 1715  BP: 112/68 120/60 (!) 113/59 (!) 106/57  Pulse: 99 (!) 103 97 (!) 104  Resp: 19 (!) 21 16 17   Temp:      TempSrc:      SpO2: 98% 100% 98% 95%  Weight:      Height:       Physical Exam Constitutional:      Appearance: She is normal weight.  HENT:     Head: Normocephalic and atraumatic.     Nose: Nose normal.     Mouth/Throat:     Mouth: Mucous membranes are dry.  Eyes:     Pupils: Pupils are equal, round, and reactive to light.  Cardiovascular:     Rate and Rhythm: Normal rate and regular rhythm.  Pulmonary:     Effort: Pulmonary effort is normal.  Abdominal:     General: Bowel sounds are normal.  Musculoskeletal:        General: Normal range of motion.  Skin:    General: Skin is warm and dry.  Neurological:     General: No focal deficit present.  Psychiatric:        Mood and Affect: Mood normal.     Data Reviewed:  There are no new results to review at this time.  CT CHEST ABDOMEN PELVIS W CONTRAST CLINICAL DATA:  Sepsis with UTI symptoms and lethargy.  EXAM: CT CHEST, ABDOMEN, AND PELVIS WITH CONTRAST  TECHNIQUE: Multidetector CT imaging of the chest, abdomen and pelvis was performed following the standard protocol during bolus administration of intravenous contrast.  RADIATION DOSE REDUCTION: This exam was performed according to the departmental dose-optimization program which includes automated exposure control, adjustment of the mA and/or kV according to patient size and/or use of iterative reconstruction technique.  CONTRAST:  80mL OMNIPAQUE IOHEXOL 300 MG/ML  SOLN  COMPARISON:  Abdominopelvic CT 08/21/2022.  FINDINGS: CT CHEST  FINDINGS  Cardiovascular: Atherosclerosis of the aorta, great vessels and coronary arteries. No acute vascular findings. The heart size is normal. There is no pericardial effusion.  Mediastinum/Nodes: There are no enlarged mediastinal, hilar or axillary lymph nodes. The thyroid gland, trachea and esophagus demonstrate no significant findings.  Lungs/Pleura: No pleural effusion or pneumothorax. Mild centrilobular and paraseptal emphysema with mild diffuse central airway thickening. No focal airspace disease or suspicious pulmonary nodularity.  Musculoskeletal/Chest wall: No chest wall mass or suspicious osseous findings.  CT ABDOMEN AND PELVIS FINDINGS  Hepatobiliary: The liver is normal in density without suspicious focal abnormality. Ill-defined low-density inferiorly in the right hepatic lobe adjacent to the gallbladder on image 74/2 is unchanged, possibly focal fat. No evidence of gallstones, gallbladder wall thickening or biliary dilatation.  Pancreas: Unremarkable. No pancreatic ductal dilatation or surrounding inflammatory changes.  Spleen: Normal in size without focal  abnormality.  Adrenals/Urinary Tract: Both adrenal glands appear normal. No evidence of urinary tract calculus, suspicious renal lesion or hydronephrosis. Mild right renal cortical scarring. The bladder appears unremarkable for its degree of distention.  Stomach/Bowel: No enteric contrast administered. The stomach appears unremarkable for its degree of distension. No evidence of bowel wall thickening, distention or surrounding inflammatory change. The appendix appears normal. Moderate stool throughout the colon.  Vascular/Lymphatic: There are no enlarged abdominal or pelvic lymph nodes. Aortic and branch vessel atherosclerosis. No evidence of aneurysm or large vessel occlusion.  Reproductive: Hysterectomy.  No adnexal mass.  Other: No evidence of abdominal wall mass or hernia. No ascites  or pneumoperitoneum.  Musculoskeletal: No acute or significant osseous findings. Chronic degenerative disc disease at L5-S1 with asymmetric right hip degenerative changes.  IMPRESSION: 1. No acute findings or explanation for the patient's symptoms. 2. Moderate stool throughout the colon suggesting constipation. 3. Aortic Atherosclerosis (ICD10-I70.0) and Emphysema (ICD10-J43.9).  Electronically Signed   By: Carey Bullocks M.D.   On: 07/28/2023 15:03 DG Chest Port 1 View CLINICAL DATA:  Sepsis.  EXAM: PORTABLE CHEST 1 VIEW  COMPARISON:  April 11, 2023.  FINDINGS: The heart size and mediastinal contours are within normal limits. Both lungs are clear. The visualized skeletal structures are unremarkable.  IMPRESSION: No active disease.  Electronically Signed   By: Lupita Raider M.D.   On: 07/28/2023 12:31  Lab Results  Component Value Date   WBC 13.2 (H) 07/28/2023   HGB 15.2 (H) 07/28/2023   HCT 45.3 07/28/2023   MCV 88.6 07/28/2023   PLT 347 07/28/2023   Lab Results  Component Value Date   WBC 13.2 (H) 07/28/2023   HGB 15.2 (H) 07/28/2023   HCT 45.3 07/28/2023   MCV 88.6 07/28/2023   PLT 347 07/28/2023    Assessment and Plan: SIRS (systemic inflammatory response syndrome) (HCC) Meeting SIRS criteria with heart rate 100s, initial systolic pressures in the 70s to 80s-improving fluids, white count of 13.6 Noted gastroenteritis symptoms x 1 to 2 days Urinalysis grossly stable CT of the chest abdomen pelvis also within normal limit Lactate 3.6-->2.6 with IV fluid hydration Procalcitonin to correlate Continue LR maintenance IV fluids Panculture Defer antibiotics for now  Reassess as appropriate    Type II diabetes mellitus with renal manifestations (HCC) Blood sugar 150s w/o overt acidosis  Long acting insulin and SSI  Monitor   Chronic kidney disease, stage 3a (HCC) Creatinine 1.44 with GFR in the 40s in the setting of SIRS and dehydration IV  fluid hydration Trend renal function Minimize nephrotoxic agents  HLD (hyperlipidemia) Statin    Cocaine abuse (HCC) Noted prior heavy cocaine use  Pt adamantly reports cessation since last admission  Monitor   Tobacco abuse 1/4 PPD smoker  Discussed cessation  Nicotine patch   Anxiety and depression Cont wellbutrin     GERD (gastroesophageal reflux disease) PPI    Greater than 50% was spent in counseling and coordination of care with patient Critical care time: 70+ minutes    Advance Care Planning:   Code Status: Full Code   Consults: None   Family Communication: No family at the bedside   Severity of Illness: The appropriate patient status for this patient is INPATIENT. Inpatient status is judged to be reasonable and necessary in order to provide the required intensity of service to ensure the patient's safety. The patient's presenting symptoms, physical exam findings, and initial radiographic and laboratory data in the context of their chronic comorbidities  is felt to place them at high risk for further clinical deterioration. Furthermore, it is not anticipated that the patient will be medically stable for discharge from the hospital within 2 midnights of admission.   * I certify that at the point of admission it is my clinical judgment that the patient will require inpatient hospital care spanning beyond 2 midnights from the point of admission due to high intensity of service, high risk for further deterioration and high frequency of surveillance required.*  Author: Floydene Flock, MD 07/28/2023 5:47 PM  For on call review [email or web address].

## 2023-07-28 NOTE — Assessment & Plan Note (Signed)
 Cont wellbutrin

## 2023-07-28 NOTE — Sepsis Progress Note (Signed)
Notified bedside nurse of need to draw repeat lactic acid. 

## 2023-07-28 NOTE — Progress Notes (Signed)
CODE SEPSIS - PHARMACY COMMUNICATION  **Broad Spectrum Antibiotics should be administered within 1 hour of Sepsis diagnosis**  Time Code Sepsis Called/Page Received: 11/16 @ 1127  Antibiotics Ordered:  Cefepime 2g x 1 Flagyl 500 mg x 1  Time of 1st antibiotic administration: 1/16 1156  Additional action taken by pharmacy: NA  If necessary, Name of Provider/Nurse Contacted: NA  Effie Shy, PharmD Pharmacy Resident  07/28/2023 11:36 AM

## 2023-07-28 NOTE — Assessment & Plan Note (Signed)
Noted prior heavy cocaine use  Pt adamantly reports cessation since last admission  Monitor

## 2023-07-28 NOTE — ED Notes (Signed)
Pamela Billings, NP messaged via secure chat to notify of high CBG. Patient does not have sliding scale at this time.

## 2023-07-28 NOTE — Assessment & Plan Note (Signed)
Blood sugar 150s w/o overt acidosis  Long acting insulin and SSI  Monitor

## 2023-07-28 NOTE — Assessment & Plan Note (Signed)
?   Statin.

## 2023-07-28 NOTE — Assessment & Plan Note (Signed)
PPI ?

## 2023-07-28 NOTE — Assessment & Plan Note (Signed)
Creatinine 1.44 with GFR in the 40s in the setting of SIRS and dehydration IV fluid hydration Trend renal function Minimize nephrotoxic agents

## 2023-07-28 NOTE — ED Notes (Signed)
Entered room to check on patient and repeat VS. Patient asked for PB and crackers, explained that we would not be providing that at this time due to highly elevated BGL. Patient immediately said "Don't f*cking start with me, I'm not about to argue with you, you won't win." Attempted to explain to patient that she was at risk of going into DKA which could make her very sick and increase her length of hospital stay. Patient stated "I don't care, I'm here anyway." This RN went to exit room and patient called her a "B*tch".

## 2023-07-28 NOTE — ED Provider Notes (Signed)
[facility] Provider Note    Event Date/Time   First MD Initiated Contact with Patient 07/28/23 1124     (approximate)   History   Chief Complaint: Code Sepsis   HPI  Pamela Lane is a 63 y.o. female with a history of hypertension diabetes bipolar disorder who was brought to the ED due to worsening generalized weakness over the past week.  Patient also reports dysuria.  Over the last 2 days she has had dizziness with standing and passing out, this morning she was unable to get out of bed without passing out so she called EMS.  They found initial blood pressure 70/50, then 80/20 with syncope and started Levophed.  Currently patient is awake, denies pain but does endorse productive cough, generalized weakness.          Physical Exam   Triage Vital Signs: ED Triage Vitals  Encounter Vitals Group     BP 07/28/23 1123 (!) 73/64     Systolic BP Percentile --      Diastolic BP Percentile --      Pulse Rate 07/28/23 1122 (!) 107     Resp 07/28/23 1122 (!) 120     Temp 07/28/23 1122 97.8 F (36.6 C)     Temp Source 07/28/23 1122 Oral     SpO2 --      Weight 07/28/23 1123 159 lb 9.8 oz (72.4 kg)     Height 07/28/23 1123 5\' 3"  (1.6 m)     Head Circumference --      Peak Flow --      Pain Score 07/28/23 1123 8     Pain Loc --      Pain Education --      Exclude from Growth Chart --     Most recent vital signs: Vitals:   07/28/23 1445 07/28/23 1500  BP: 96/72 112/68  Pulse: 100 99  Resp: (!) 23 19  Temp:    SpO2: 98% 98%    General: Awake, no distress.  CV:  Good peripheral perfusion. Tachycardia, HR 110 Resp:  Normal effort. R side crackles Abd:  No distention. Soft, diffuse tenderness Other:  Dry oral mucosa. No wounds / soft tissue inflammatory changes / rash.   ED Results / Procedures / Treatments   Labs (all labs ordered are listed, but only abnormal results are displayed) Labs Reviewed  LACTIC ACID, PLASMA - Abnormal;  Notable for the following components:      Result Value   Lactic Acid, Venous 3.6 (*)    All other components within normal limits  LACTIC ACID, PLASMA - Abnormal; Notable for the following components:   Lactic Acid, Venous 2.6 (*)    All other components within normal limits  CBC WITH DIFFERENTIAL/PLATELET - Abnormal; Notable for the following components:   WBC 13.2 (*)    Hemoglobin 15.2 (*)    Neutro Abs 9.6 (*)    Monocytes Absolute 1.1 (*)    All other components within normal limits  URINALYSIS, W/ REFLEX TO CULTURE (INFECTION SUSPECTED) - Abnormal; Notable for the following components:   Color, Urine YELLOW (*)    APPearance HAZY (*)    Glucose, UA >=500 (*)    Ketones, ur 20 (*)    Leukocytes,Ua LARGE (*)    All other components within normal limits  COMPREHENSIVE METABOLIC PANEL - Abnormal; Notable for the following components:   CO2 19 (*)    Glucose, Bld 154 (*)    BUN  33 (*)    Creatinine, Ser 1.44 (*)    AST 14 (*)    GFR, Estimated 41 (*)    All other components within normal limits  RESP PANEL BY RT-PCR (RSV, FLU A&B, COVID)  RVPGX2  CULTURE, BLOOD (ROUTINE X 2)  CULTURE, BLOOD (ROUTINE X 2)  URINE CULTURE  PROTIME-INR  APTT     EKG Interpreted by me Sinus tachycardia, rate 109. Normal axis and intervals. Normal  QRS, ST segments, and T waves   RADIOLOGY Chest x-ray interpreted by me, appears unremarkable.  Radiology report reviewed   PROCEDURES:  Procedures   MEDICATIONS ORDERED IN ED: Medications  lactated ringers infusion ( Intravenous New Bag/Given 07/28/23 1153)  0.9 %  sodium chloride infusion (250 mLs Intravenous New Bag/Given 07/28/23 1323)  norepinephrine (LEVOPHED) 4mg  in (0.016 mg/mL) premix infusion (0 mcg/min Intravenous Stopped 07/28/23 1428)  sodium chloride 0.9 % bolus 2,000 mL (0 mLs Intravenous Stopped 07/28/23 1251)  ceFEPIme (MAXIPIME) 2 g in sodium chloride 0.9 % 100 mL IVPB (0 g Intravenous Stopped 07/28/23 1230)   metroNIDAZOLE (FLAGYL) IVPB 500 mg (0 mg Intravenous Stopped 07/28/23 1251)  iohexol (OMNIPAQUE) 300 MG/ML solution 80 mL (80 mLs Intravenous Contrast Given 07/28/23 1346)     IMPRESSION / MDM / ASSESSMENT AND PLAN / ED COURSE  I reviewed the triage vital signs and the nursing notes.  DDx: pneumonia, UTI, sepsis, AKI, electrolyte derangement, colitis, influenza, COVID  Patient's presentation is most consistent with acute presentation with potential threat to life or bodily function.  Patient presents with hypotension, fatigue, nonproductive cough, nonfocal abdominal pain.  Will start sepsis workup, give large-volume fluid bolus   Clinical Course as of 07/28/23 1522  Thu Jul 28, 2023  1323 Initial workup does not reveal any infectious source.  With the patient's hypotension and syncope and strong suspicion for sepsis, will obtain CT chest abdomen pelvis.  She does still have generalized abdominal tenderness though no peritoneal signs. [PS]  1421 BP normalized x 20 minutes on levophed 41mcg/min. Will DC levophed  [PS]    Clinical Course User Index [PS] Sharman Cheek, MD    ----------------------------------------- 3:25 PM on 07/28/2023 ----------------------------------------- Case discussed with hospitalist for further evaluation.   FINAL CLINICAL IMPRESSION(S) / ED DIAGNOSES   Final diagnoses:  Syncope, unspecified syncope type  AKI (acute kidney injury) (HCC)  Type 2 diabetes mellitus without complication, with long-term current use of insulin (HCC)     Rx / DC Orders   ED Discharge Orders     None        Note:  This document was prepared using Dragon voice recognition software and may include unintentional dictation errors.   Sharman Cheek, MD 07/28/23 1525

## 2023-07-28 NOTE — Assessment & Plan Note (Signed)
1/4 PPD smoker  Discussed cessation  Nicotine patch

## 2023-07-28 NOTE — ED Triage Notes (Signed)
Pt here via [facility] with a code sepsis. Husband called after pt was lethargic. Pt having UTI symptoms as well. Pt first bp was 70/50 then pt received 500cc of LR with ems. Pt next bp was 80/20 and then Levo was started at 4 mcg/mion.    150-cbg 98.8 20G RAC

## 2023-07-28 NOTE — Sepsis Progress Note (Signed)
Elink monitoring for the code sepsis protocol.  

## 2023-07-28 NOTE — Assessment & Plan Note (Addendum)
Meeting SIRS criteria with heart rate 100s, initial systolic pressures in the 70s to 80s-improving fluids, white count of 13.6 Noted gastroenteritis symptoms x 1 to 2 days Urinalysis grossly stable CT of the chest abdomen pelvis also within normal limit Lactate 3.6-->2.6 with IV fluid hydration Procalcitonin to correlate Continue LR maintenance IV fluids Panculture Defer antibiotics for now  Reassess as appropriate

## 2023-07-29 ENCOUNTER — Encounter: Payer: Self-pay | Admitting: Internal Medicine

## 2023-07-29 DIAGNOSIS — E119 Type 2 diabetes mellitus without complications: Secondary | ICD-10-CM | POA: Diagnosis not present

## 2023-07-29 DIAGNOSIS — R55 Syncope and collapse: Secondary | ICD-10-CM

## 2023-07-29 DIAGNOSIS — Z794 Long term (current) use of insulin: Secondary | ICD-10-CM

## 2023-07-29 DIAGNOSIS — N179 Acute kidney failure, unspecified: Secondary | ICD-10-CM | POA: Diagnosis not present

## 2023-07-29 DIAGNOSIS — E1121 Type 2 diabetes mellitus with diabetic nephropathy: Secondary | ICD-10-CM | POA: Diagnosis not present

## 2023-07-29 LAB — MAGNESIUM: Magnesium: 1.9 mg/dL (ref 1.7–2.4)

## 2023-07-29 LAB — CBC
HCT: 36.5 % (ref 36.0–46.0)
Hemoglobin: 12.6 g/dL (ref 12.0–15.0)
MCH: 30.1 pg (ref 26.0–34.0)
MCHC: 34.5 g/dL (ref 30.0–36.0)
MCV: 87.1 fL (ref 80.0–100.0)
Platelets: 231 10*3/uL (ref 150–400)
RBC: 4.19 MIL/uL (ref 3.87–5.11)
RDW: 12.4 % (ref 11.5–15.5)
WBC: 7.1 10*3/uL (ref 4.0–10.5)
nRBC: 0 % (ref 0.0–0.2)

## 2023-07-29 LAB — COMPREHENSIVE METABOLIC PANEL
ALT: 11 U/L (ref 0–44)
AST: 12 U/L — ABNORMAL LOW (ref 15–41)
Albumin: 3.6 g/dL (ref 3.5–5.0)
Alkaline Phosphatase: 80 U/L (ref 38–126)
Anion gap: 12 (ref 5–15)
BUN: 25 mg/dL — ABNORMAL HIGH (ref 8–23)
CO2: 18 mmol/L — ABNORMAL LOW (ref 22–32)
Calcium: 8.6 mg/dL — ABNORMAL LOW (ref 8.9–10.3)
Chloride: 99 mmol/L (ref 98–111)
Creatinine, Ser: 1.15 mg/dL — ABNORMAL HIGH (ref 0.44–1.00)
GFR, Estimated: 54 mL/min — ABNORMAL LOW (ref >60)
Glucose, Bld: 608 mg/dL (ref 70–99)
Potassium: 4.1 mmol/L (ref 3.5–5.1)
Sodium: 129 mmol/L — ABNORMAL LOW (ref 135–145)
Total Bilirubin: 0.5 mg/dL (ref 0.0–1.2)
Total Protein: 6.7 g/dL (ref 6.5–8.1)

## 2023-07-29 LAB — BETA-HYDROXYBUTYRIC ACID: Beta-Hydroxybutyric Acid: 0.47 mmol/L — ABNORMAL HIGH (ref 0.05–0.27)

## 2023-07-29 LAB — CBG MONITORING, ED
Glucose-Capillary: 161 mg/dL — ABNORMAL HIGH (ref 70–99)
Glucose-Capillary: 206 mg/dL — ABNORMAL HIGH (ref 70–99)
Glucose-Capillary: 208 mg/dL — ABNORMAL HIGH (ref 70–99)
Glucose-Capillary: 241 mg/dL — ABNORMAL HIGH (ref 70–99)
Glucose-Capillary: 254 mg/dL — ABNORMAL HIGH (ref 70–99)
Glucose-Capillary: 290 mg/dL — ABNORMAL HIGH (ref 70–99)
Glucose-Capillary: 366 mg/dL — ABNORMAL HIGH (ref 70–99)
Glucose-Capillary: 438 mg/dL — ABNORMAL HIGH (ref 70–99)
Glucose-Capillary: 473 mg/dL — ABNORMAL HIGH (ref 70–99)
Glucose-Capillary: 537 mg/dL (ref 70–99)

## 2023-07-29 LAB — URINE CULTURE: Culture: <10000 — AB

## 2023-07-29 LAB — BLOOD GAS, VENOUS
Acid-base deficit: 3.8 mmol/L — ABNORMAL HIGH (ref 0.0–2.0)
Bicarbonate: 21.5 mmol/L (ref 20.0–28.0)
O2 Saturation: 86.6 %
Patient temperature: 37
pCO2, Ven: 39 mmHg — ABNORMAL LOW (ref 44–60)
pH, Ven: 7.35 (ref 7.25–7.43)
pO2, Ven: 55 mmHg — ABNORMAL HIGH (ref 32–45)

## 2023-07-29 LAB — GLUCOSE, CAPILLARY: Glucose-Capillary: 319 mg/dL — ABNORMAL HIGH (ref 70–99)

## 2023-07-29 LAB — BASIC METABOLIC PANEL
Anion gap: 10 (ref 5–15)
BUN: 18 mg/dL (ref 8–23)
CO2: 21 mmol/L — ABNORMAL LOW (ref 22–32)
Calcium: 8.7 mg/dL — ABNORMAL LOW (ref 8.9–10.3)
Chloride: 105 mmol/L (ref 98–111)
Creatinine, Ser: 0.88 mg/dL (ref 0.44–1.00)
GFR, Estimated: >60 mL/min (ref >60)
Glucose, Bld: 362 mg/dL — ABNORMAL HIGH (ref 70–99)
Potassium: 3.3 mmol/L — ABNORMAL LOW (ref 3.5–5.1)
Sodium: 136 mmol/L (ref 135–145)

## 2023-07-29 LAB — PHOSPHORUS: Phosphorus: 3.3 mg/dL (ref 2.5–4.6)

## 2023-07-29 LAB — PROCALCITONIN: Procalcitonin: <0.1 ng/mL

## 2023-07-29 MED ORDER — INSULIN ASPART 100 UNIT/ML IJ SOLN
0.0000 [IU] | Freq: Three times a day (TID) | INTRAMUSCULAR | Status: DC
Start: 2023-07-29 — End: 2023-07-30
  Administered 2023-07-29: 6 [IU] via SUBCUTANEOUS
  Administered 2023-07-30: 7 [IU] via SUBCUTANEOUS
  Administered 2023-07-30: 5 [IU] via SUBCUTANEOUS
  Filled 2023-07-29 (×3): qty 1

## 2023-07-29 MED ORDER — ADULT MULTIVITAMIN W/MINERALS CH
1.0000 | ORAL_TABLET | Freq: Every day | ORAL | Status: DC
  Administered 2023-07-29 – 2023-07-30 (×2): 1 via ORAL
  Filled 2023-07-29 (×2): qty 1

## 2023-07-29 MED ORDER — INSULIN ASPART 100 UNIT/ML IJ SOLN
0.0000 [IU] | Freq: Every day | INTRAMUSCULAR | Status: DC
  Administered 2023-07-29: 4 [IU] via SUBCUTANEOUS
  Filled 2023-07-29: qty 1

## 2023-07-29 MED ORDER — DEXTROSE 50 % IV SOLN: 0.0000 mL | INTRAVENOUS | Status: DC | PRN

## 2023-07-29 MED ORDER — INSULIN GLARGINE-YFGN 100 UNIT/ML ~~LOC~~ SOLN
40.0000 [IU] | Freq: Every day | SUBCUTANEOUS | Status: DC
  Administered 2023-07-29 – 2023-07-30 (×2): 40 [IU] via SUBCUTANEOUS
  Filled 2023-07-29 (×2): qty 0.4

## 2023-07-29 MED ORDER — INSULIN REGULAR(HUMAN) IN NACL 100-0.9 UT/100ML-% IV SOLN
INTRAVENOUS | Status: DC
  Administered 2023-07-29: 5.5 [IU]/h via INTRAVENOUS
  Filled 2023-07-29: qty 100

## 2023-07-29 MED ORDER — POTASSIUM CHLORIDE CRYS ER 20 MEQ PO TBCR
40.0000 meq | EXTENDED_RELEASE_TABLET | ORAL | Status: AC
  Administered 2023-07-29: 40 meq via ORAL
  Filled 2023-07-29: qty 2

## 2023-07-29 MED ORDER — GLUCERNA SHAKE PO LIQD
237.0000 mL | Freq: Three times a day (TID) | ORAL | Status: DC
  Administered 2023-07-30: 237 mL via ORAL

## 2023-07-29 MED ORDER — LACTATED RINGERS IV SOLN
INTRAVENOUS | Status: AC
Start: 2023-07-29 — End: 2023-07-30

## 2023-07-29 MED ORDER — DEXTROSE IN LACTATED RINGERS 5 % IV SOLN: INTRAVENOUS | Status: AC

## 2023-07-29 MED ORDER — LACTATED RINGERS IV SOLN: INTRAVENOUS | Status: DC

## 2023-07-29 NOTE — ED Notes (Signed)
Dr Sharol Harness regarding continuing insulin drip or transitioning to SQ insulin. Awaiting orders.

## 2023-07-29 NOTE — ED Notes (Signed)
Per Dr Meriam Sprague, will transition pt to SQ insulin. Awaiting orders to be placed

## 2023-07-29 NOTE — ED Notes (Signed)
Pt requesting breakfast tray, Dr Sharol Harness

## 2023-07-29 NOTE — ED Notes (Signed)
Pt provided w/ lunch

## 2023-07-29 NOTE — ED Notes (Signed)
Attempted to call report to ICU, was informed unable to take pt at this time. Raquel AD made aware

## 2023-07-29 NOTE — ED Notes (Signed)
Cbg 208

## 2023-07-29 NOTE — ED Notes (Signed)
Pt provided w/ water and supplies for oral hygiene per request.

## 2023-07-29 NOTE — Progress Notes (Signed)
       CROSS COVER NOTE  NAME: Pamela Lane MRN: [id number] DOB : 04/18/1961 ATTENDING PHYSICIAN: Floydene Flock, MD    Date of Service   07/29/2023   HPI/Events of Note   Severe hyperglycemia above 500 most of night  Interventions   Assessment/Plan: IV insulin per endotool - hyperglycemia        Donnie Mesa NP [facility] Cross Cover 7pm-7am - check amion for availability Pager [phone]

## 2023-07-29 NOTE — Inpatient Diabetes Management (Addendum)
Inpatient Diabetes Program Recommendations  AACE/ADA: New Consensus Statement on Inpatient Glycemic Control (2015)  Target Ranges:  Prepandial:   less than 140 mg/dL      Peak postprandial:   less than 180 mg/dL (1-2 hours)      Critically ill patients:  140 - 180 mg/dL   Lab Results  Component Value Date   GLUCAP 206 (H) 07/29/2023   HGBA1C >15.5 (H) 04/11/2023    Review of Glycemic Control  Latest Reference Range & Units 07/29/23 04:18 07/29/23 05:18 07/29/23 06:18 07/29/23 07:58 07/29/23 09:04  Glucose-Capillary 70 - 99 mg/dL 161 (H) 096 (H) 045 (H) 241 (H) 206 (H)  (H): Data is abnormally high Diabetes history: Type 2 Dm Outpatient Diabetes medications: Lantus 50 units at bedtime, Novolog 15 units TID (NT), Januvia 100 mg QD Current orders for Inpatient glycemic control: IV insulin, Semglee 30 units x 1  Inpatient Diabetes Program Recommendations:    Noted hyperglycemia with IV insulin start. Question if patient ate? Awaiting labs. Repeat A1c?  When ready to transition, consider: -Semglee 40 units two hours prior to discontinuation of IV insulin, then every day to follow  -Novolog 6 units TID (assuming diet to advance and patient consuming >50% of meals) -Novolog 0-9 units TID & HS  Addendum: Spoke with patient regarding outpatient diabetes management. Upon entry, patient making her bed without pants on and says, "My A1c is always high and I am doing my best. But I aint taking Novolog; it is just too much." Reports taking one dose of insulin per day.  Reviewed patient's previous A1c of >15.5%. Explained what a A1c is and what it measures. Also reviewed goal A1c with patient, importance of good glucose control @ home, and blood sugar goals. Reviewed patho of DM, need for improved control, survival skills, interventions, risk for infection, vascular changes and commorbidities.  Patient will need a new meter at discharge. Reviewed recommended frequency of checking and when to see  MD. Patient already has PCP appointment for follow up at the [facility].  Patient does not have issues obtaining insulin. No further questions.    Thanks, Lujean Rave, MSN, RNC-OB Diabetes Coordinator [phone] (8a-5p)

## 2023-07-29 NOTE — ED Notes (Signed)
Pt currently eating breakfast tray  

## 2023-07-29 NOTE — Progress Notes (Signed)
Progress Note   Patient: Pamela Lane NGE:[id number] DOB: May 08, 1961 DOA: 07/28/2023     1 DOS: the patient was seen and examined on 07/29/2023   Brief hospital course:  Pamela Lane is a 63 y.o. female with medical history significant of asthma, polysubstance abuse, type 2 diabetes, hypertension, bipolar disorder presenting with DKA, polysubstance abuse presenting with SIRS, hypotension.     Assessment and Plan: SIRS (systemic inflammatory response syndrome) (HCC) Meeting SIRS criteria with heart rate 100s, initial systolic pressures in the 70s to 80s-improving fluids, white count of 13.6 Noted gastroenteritis symptoms x 1 to 2 days Urinalysis grossly stable CT of the chest abdomen pelvis also within normal limit Lactate 3.6-->2.6 with IV fluid hydration Continue to monitor CBC closely No indication for antibiotics at this time     Incipient DKA in a patient with type II diabetes mellitus with renal manifestations (HCC) DKA resolved and insulin drip transition to subcutaneous insulin Continue to monitor sugars closely   Chronic kidney disease, stage 3a (HCC) Creatinine 1.44 with GFR in the 40s in the setting of SIRS and dehydration IV fluid hydration Trend renal function Minimize nephrotoxic agents   HLD (hyperlipidemia) Continue statin therapy     Cocaine abuse (HCC) Noted prior heavy cocaine use  Pt adamantly reports cessation since last admission  Monitor    Tobacco abuse 1/4 PPD smoker  Continue nicotine patch I have counseled patient on cessation     Anxiety and depression Continue wellbutrin        GERD (gastroesophageal reflux disease) Continue PPI therapy    Advance Care Planning:   Code Status: Full Code    Consults: None    Family Communication: No family at the bedside     Subjective:  Patient seen and examined in the presence of physical therapist today Improvement in mental status Denies nausea vomiting abdominal pain Labs  improving insulin drip transition to subcutaneous insulin  Physical Exam:   Appearance: She is normal weight.  HENT:     Head: Normocephalic and atraumatic.     Nose: Nose normal.     Mouth/Throat:     Mouth: Mucous membranes are dry.  Eyes:     Pupils: Pupils are equal, round, and reactive to light.  Cardiovascular:     Rate and Rhythm: Normal rate and regular rhythm.  Pulmonary:     Effort: Pulmonary effort is normal.  Abdominal:     General: Bowel sounds are normal.  Musculoskeletal:        General: Normal range of motion.  Skin:    General: Skin is warm and dry.  Neurological:     General: No focal deficit present.  Psychiatric:        Mood and Affect: Mood normal.   Vitals:   07/29/23 1015 07/29/23 1141 07/29/23 1300 07/29/23 1400  BP:  124/79 121/82 135/69  Pulse: (!) 109 (!) 105 100 93  Resp: 16 20 18  (!) 21  Temp:  97.9 F (36.6 C)    TempSrc:  Oral    SpO2: 100% 100% 100% 100%  Weight:      Height:        Data Reviewed:     Latest Ref Rng & Units 07/29/2023    1:17 AM 07/28/2023   11:40 AM 04/13/2023    5:19 AM  CBC  WBC 4.0 - 10.5 K/uL 7.1  13.2  4.1   Hemoglobin 12.0 - 15.0 g/dL 40.1  02.7  25.3   Hematocrit 36.0 -  46.0 % 36.5  45.3  33.2   Platelets 150 - 400 K/uL 231  347  220        Latest Ref Rng & Units 07/29/2023    8:32 AM 07/29/2023    1:17 AM 07/28/2023   12:39 PM  BMP  Glucose 70 - 99 mg/dL 161  096  045   BUN 8 - 23 mg/dL 18  25  33   Creatinine 0.44 - 1.00 mg/dL 4.09  8.11  9.14   Sodium 135 - 145 mmol/L 136  129  137   Potassium 3.5 - 5.1 mmol/L 3.3  4.1  3.6   Chloride 98 - 111 mmol/L 105  99  104   CO2 22 - 32 mmol/L 21  18  19    Calcium 8.9 - 10.3 mg/dL 8.7  8.6  9.0     I have reviewed patient's CT scan of the chest that did not show any acute findings  Time spent: 57 minutes  Author: Loyce Dys, MD 07/29/2023 2:42 PM  For on call review [email or web address].

## 2023-07-29 NOTE — Discharge Instructions (Signed)

## 2023-07-29 NOTE — Evaluation (Signed)
Physical Therapy Evaluation Patient Details Name: Pamela Lane MRN: [id number] DOB: [birth year] Today's Date: 07/29/2023  History of Present Illness  Pt is a 63 y.o. female with medical history significant of asthma, polysubstance abuse, type 2 diabetes, hypertension, bipolar disorder presenting with DKA, polysubstance abuse presenting with SIRS and hypotension.   Clinical Impression  Pt was pleasant and motivated to participate during the session and put forth good effort throughout. Pt found in supine in ER bed and was able to dress self in hospital scrubs at the EOB independently with good speed and effort. Pt Ind with all functional tasks per below and presented with very good stability during ambulation including while performing dynamic gait tasks such as start/stops, head turns, and 180 deg turns.  No adverse symptoms noted during the session with SpO2 and HR WNL on room air.  No skilled PT needs identified at this time.  Will complete PT orders at this time but will reassess pt pending a change in status upon receipt of new PT orders.          If plan is discharge home, recommend the following: Assist for transportation   Can travel by private vehicle        Equipment Recommendations None recommended by PT  Recommendations for Other Services       Functional Status Assessment Patient has not had a recent decline in their functional status     Precautions / Restrictions Precautions Precautions: None Restrictions Weight Bearing Restrictions Per Provider Order: No      Mobility  Bed Mobility Overal bed mobility: Independent                  Transfers Overall transfer level: Independent                      Ambulation/Gait Ambulation/Gait assistance: Independent Gait Distance (Feet): 200 Feet Assistive device: None Gait Pattern/deviations: WFL(Within Functional Limits) Gait velocity: WFL     General Gait Details: Pt steady with gait with  good cadence without an AD including during start/stops, with head turns, and during 180 deg turns  Careers information officer     Tilt Bed    Modified Rankin (Stroke Patients Only)       Balance Overall balance assessment: No apparent balance deficits (not formally assessed)                                           Pertinent Vitals/Pain Pain Assessment Pain Assessment: No/denies pain    Home Living Family/patient expects to be discharged to:: Private residence Living Arrangements: Spouse/significant other Available Help at Discharge: Available 24 hours/day Type of Home: House Home Access: Stairs to enter Entrance Stairs-Rails: Right;Left;Can reach both Entrance Stairs-Number of Steps: 2   Home Layout: One level Home Equipment: Rollator (4 wheels);Cane - single point      Prior Function Prior Level of Function : Independent/Modified Independent             Mobility Comments: Ind amb community distances without an AD, walkes around the block for exercise, multiple recent falls secondary to dizziness due to low BP per patient report ADLs Comments: Ind with ADLs     Extremity/Trunk Assessment   Upper Extremity Assessment Upper Extremity Assessment: Overall WFL for tasks assessed    Lower  Extremity Assessment Lower Extremity Assessment: Overall WFL for tasks assessed       Communication   Communication Communication: No apparent difficulties  Cognition Arousal: Alert Behavior During Therapy: WFL for tasks assessed/performed Overall Cognitive Status: Within Functional Limits for tasks assessed                                          General Comments      Exercises     Assessment/Plan    PT Assessment Patient does not need any further PT services  PT Problem List         PT Treatment Interventions      PT Goals (Current goals can be found in the Care Plan section)  Acute Rehab PT  Goals PT Goal Formulation: All assessment and education complete, DC therapy    Frequency       Co-evaluation               AM-PAC PT "6 Clicks" Mobility  Outcome Measure Help needed turning from your back to your side while in a flat bed without using bedrails?: None Help needed moving from lying on your back to sitting on the side of a flat bed without using bedrails?: None Help needed moving to and from a bed to a chair (including a wheelchair)?: None Help needed standing up from a chair using your arms (e.g., wheelchair or bedside chair)?: None Help needed to walk in hospital room?: None Help needed climbing 3-5 steps with a railing? : None 6 Click Score: 24    End of Session Equipment Utilized During Treatment: Gait belt Activity Tolerance: Patient tolerated treatment well Patient left: in bed;with call bell/phone within reach Nurse Communication: Mobility status PT Visit Diagnosis: Difficulty in walking, not elsewhere classified (R26.2)    Time: 7829-5621 PT Time Calculation (min) (ACUTE ONLY): 24 min   Charges:   PT Evaluation $PT Eval Low Complexity: 1 Low   PT General Charges $$ ACUTE PT VISIT: 1 Visit       D. Elly Modena PT, DPT 07/29/23, 9:51 AM

## 2023-07-29 NOTE — ED Notes (Signed)
Pt provided w/ bathing wipes per request.

## 2023-07-29 NOTE — ED Notes (Signed)
Per Dr Meriam Sprague, need another BMP before transitioning to SQ insulin. This RN to bedside to obtain labs, pt began yelling at this RN regarding tired of being in the hospital and blood draws and needs to eat. Attempted to explain plan of care to pt. Cooperative with obtaining labs.

## 2023-07-29 NOTE — Progress Notes (Signed)
Initial Nutrition Assessment  DOCUMENTATION CODES:   Not applicable  INTERVENTION:   -MVI with minerals daily -Continue carb modified diet -Glucerna Shake po TID, each supplement provides 220 kcal and 10 grams of protein  -RD provided "Plate Method" and "Carbohydrate Counting For People with Diabetes" handouts form AND's Nutrition Care Manual; attached to AVS/ discharge summary  -RD referred pt to [facility] for further education, support and reinforcement  NUTRITION DIAGNOSIS:   Increased nutrient needs related to acute illness as evidenced by estimated needs.  GOAL:   Patient will meet greater than or equal to 90% of their needs  MONITOR:   PO intake, Supplement acceptance  REASON FOR ASSESSMENT:   Consult Assessment of nutrition requirement/status  ASSESSMENT:   Pt with medical history significant of asthma, polysubstance abuse, type 2 diabetes, hypertension, bipolar disorder presenting with DKA, polysubstance abuse presenting with SIRS, hypotension.  Pt admitted with SIRS and DKA.   1/17- insulin drip started, transitioned to sub Q insulin  Pt unavailable at time of visit. RD unable to obtain further nutrition-related history or complete nutrition-focused physical exam at this time.    Pt on a carb modified diet. No meal completion data available to assess at this time.   Noted pt has been verbally abusive to staff down in ED.   Reviewed wt hx; pt has experienced a 8.8% wt loss over the past 6 months. While this is not significant for time frame, it is concerning given history of polysubstance abuse and uncontrolled DM.   Medications reviewed and include lovenox and dextrose 5% in lactated ringers infusion.    Lab Results  Component Value Date   HGBA1C >15.5 (H) 04/11/2023   PTA DM medications are 50 units insulin glargine daily at bedtime, 15 units insulin aspart TID, and 100 mg [place] daily.   Labs reviewed:  CBGS: 161-473 (inpatient orders for glycemic control are IV insulin drip,0-5 units insulin aspart daily at bedtime, 0-9 units insulin aspart TID with meals, and 40 units insulin glargine-yfgn daily). Tox screen positive for tetrahydrocannabinol.   Diet Order:   Diet Order             Diet Carb Modified Fluid consistency: Thin; Room service appropriate? Yes  Diet effective now                   EDUCATION NEEDS:   No education needs have been identified at this time  Skin:  Skin Assessment: Reviewed RN Assessment  Last BM:  Unknown  Height:   Ht Readings from Last 1 Encounters:  07/28/23 5\' 3"  (1.6 m)    Weight:   Wt Readings from Last 1 Encounters:  07/28/23 72.4 kg    Ideal Body Weight:  52.3 kg  BMI:  Body mass index is 28.27 kg/m.  Estimated Nutritional Needs:   Kcal:  1800-2000  Protein:  90-105 grams  Fluid:  > 1.8 L    Levada Schilling, RD, LDN, CDCES Registered Dietitian III Certified Diabetes Care and Education Specialist If unable to reach this RD, please use "RD Inpatient" group chat on secure chat between hours of 8am-4 pm daily

## 2023-07-29 NOTE — ED Notes (Signed)
Informed rn bed assigned 

## 2023-07-29 NOTE — Progress Notes (Signed)
OT Cancellation Note  Patient Details Name: Pamela Lane MRN: [id number] DOB: [birth year]   Cancelled Treatment:    Reason Eval/Treat Not Completed: OT screened, no needs identified, will sign off, Order received and chart reviewed. Per PT, pt is IND level with dressing and able to walk with no AD. No acute OT needs at this time and will sign off in house/discharge orders.  Emeline Darling Tavius Turgeon 07/29/2023, 10:40 AM

## 2023-07-29 NOTE — ED Notes (Signed)
Pt is heard yelling and cussing in the room. Another RN stops to see if pt is ok. Pt starts cussing that RN out. This RN goes into room and explains that this is not acceptable and that everyone has been kind and trying to help. Charge also went and talked to pt and explained that this behavior was not acceptable.

## 2023-07-29 NOTE — ED Notes (Signed)
While this RN was checking pt's CBG pt takes off pants and underwear. This RN asked pt why she was doing that. Pt snapped back that her "kitty" was itchy. RN didn't say anything else. Then Pt starts hollering about staff having an attitude with her. RN finishes task and leaves the room.

## 2023-07-29 NOTE — ED Notes (Signed)
Cbg 241

## 2023-07-30 DIAGNOSIS — E119 Type 2 diabetes mellitus without complications: Secondary | ICD-10-CM | POA: Diagnosis not present

## 2023-07-30 DIAGNOSIS — E1121 Type 2 diabetes mellitus with diabetic nephropathy: Secondary | ICD-10-CM | POA: Diagnosis not present

## 2023-07-30 DIAGNOSIS — N179 Acute kidney failure, unspecified: Secondary | ICD-10-CM | POA: Diagnosis not present

## 2023-07-30 DIAGNOSIS — R55 Syncope and collapse: Secondary | ICD-10-CM | POA: Diagnosis not present

## 2023-07-30 LAB — CBC WITH DIFFERENTIAL/PLATELET
Abs Immature Granulocytes: 0.02 10*3/uL (ref 0.00–0.07)
Basophils Absolute: 0 10*3/uL (ref 0.0–0.1)
Basophils Relative: 1 %
Eosinophils Absolute: 0.1 10*3/uL (ref 0.0–0.5)
Eosinophils Relative: 1 %
HCT: 36.4 % (ref 36.0–46.0)
Hemoglobin: 12.9 g/dL (ref 12.0–15.0)
Immature Granulocytes: 0 %
Lymphocytes Relative: 33 %
Lymphs Abs: 2.1 10*3/uL (ref 0.7–4.0)
MCH: 30.3 pg (ref 26.0–34.0)
MCHC: 35.4 g/dL (ref 30.0–36.0)
MCV: 85.4 fL (ref 80.0–100.0)
Monocytes Absolute: 0.4 10*3/uL (ref 0.1–1.0)
Monocytes Relative: 7 %
Neutro Abs: 3.7 10*3/uL (ref 1.7–7.7)
Neutrophils Relative %: 58 %
Platelets: 236 10*3/uL (ref 150–400)
RBC: 4.26 MIL/uL (ref 3.87–5.11)
RDW: 12.1 % (ref 11.5–15.5)
WBC: 6.3 10*3/uL (ref 4.0–10.5)
nRBC: 0 % (ref 0.0–0.2)

## 2023-07-30 LAB — BASIC METABOLIC PANEL
Anion gap: 11 (ref 5–15)
BUN: 14 mg/dL (ref 8–23)
CO2: 21 mmol/L — ABNORMAL LOW (ref 22–32)
Calcium: 8.8 mg/dL — ABNORMAL LOW (ref 8.9–10.3)
Chloride: 104 mmol/L (ref 98–111)
Creatinine, Ser: 0.73 mg/dL (ref 0.44–1.00)
GFR, Estimated: >60 mL/min (ref >60)
Glucose, Bld: 190 mg/dL — ABNORMAL HIGH (ref 70–99)
Potassium: 3.7 mmol/L (ref 3.5–5.1)
Sodium: 136 mmol/L (ref 135–145)

## 2023-07-30 LAB — GLUCOSE, CAPILLARY
Glucose-Capillary: 188 mg/dL — ABNORMAL HIGH (ref 70–99)
Glucose-Capillary: 295 mg/dL — ABNORMAL HIGH (ref 70–99)
Glucose-Capillary: 309 mg/dL — ABNORMAL HIGH (ref 70–99)

## 2023-07-30 NOTE — Progress Notes (Signed)
PHARMACY - PHYSICIAN COMMUNICATION CRITICAL VALUE ALERT - BLOOD CULTURE IDENTIFICATION (BCID)  Pamela Lane is an 63 y.o. female w/ PMH of asthma, polysubstance abuse, type 2 diabetes, hypertension, bipolar disorder who presented to [facility] on 07/28/2023 with a chief complaint of DKA  Assessment:  BCID 1/4 GPR   Name of physician Contacted: Djan, MD  Current antibiotics: none  Changes to prescribed antibiotics recommended: MD has evaluated and feels this is a contaminant   No results found for this or any previous visit.  [name] 07/30/2023  9:14 AM

## 2023-07-30 NOTE — TOC Transition Note (Signed)
Transition of Care [facility]) - Discharge Note   Patient Details  Name: Pamela Lane MRN: [id number] Date of Birth: 10/19/1960  [facility] CM/SW Contact:  Bing Quarry, RN Phone Number: 07/30/2023, 12:46 PM   Clinical Narrative:   1/18: Taxi voucher and Development worker, community to Unit RN. Attempted to contact daughter but non working number. Patient discharging to home/self care.    Gabriel Cirri MSN RN CM  RN Case Manager [place]  Transitions of Care Direct Dial: [phone] (Weekends Only) [facility] Main Office Phone: [phone] [facility] Fax: [phone] [email or web address]           Patient Goals and CMS Choice            Discharge Placement                       Discharge Plan and Services Additional resources added to the After Visit Summary for                                       Social Drivers of Health (SDOH) Interventions SDOH Screenings   Food Insecurity: No Food Insecurity (07/29/2023)  Housing: Low Risk  (07/29/2023)  Transportation Needs: No Transportation Needs (07/29/2023)  Utilities: Not At Risk (07/29/2023)  Tobacco Use: High Risk (07/29/2023)     Readmission Risk Interventions    04/11/2023    2:42 PM 01/14/2021   11:03 AM  Readmission Risk Prevention Plan  Transportation Screening Complete Complete  PCP or Specialist Appt within 3-5 Days Complete   Social Work Consult for Recovery Care Planning/Counseling Complete   Palliative Care Screening Not Applicable   Medication Review Oceanographer) Complete Referral to Pharmacy  PCP or Specialist appointment within 3-5 days of discharge  Not Complete  PCP/Specialist Appt Not Complete comments  patient left AMA  HRI or Home Care Consult  Not Complete  HRI or Home Care Consult Pt Refusal Comments  patient is independent  SW Recovery Care/Counseling Consult  Complete  Palliative Care Screening  Not Applicable  Skilled Nursing Facility  Not Applicable

## 2023-07-30 NOTE — Discharge Summary (Signed)
Physician Discharge Summary   Patient: Pamela Lane MRN: [id number] DOB: [birth year]  Admit date:     07/28/2023  Discharge date: 07/30/23  Discharge Physician: Loyce Dys   PCP: [facility]   Recommendations at discharge:  Follow-up with primary care physician  Discharge Diagnoses  SIRS (systemic inflammatory response syndrome) (HCC) Incipient DKA in a patient with type II diabetes mellitus with renal manifestations (HCC) Chronic kidney disease, stage 3a (HCC) HLD (hyperlipidemia) Cocaine abuse (HCC) Tobacco abuse Anxiety and depression GERD (gastroesophageal reflux disease)  Hospital Course: IVAL MCCULLOGH is a 63 y.o. female with medical history significant of asthma, polysubstance abuse, type 2 diabetes, hypertension, bipolar disorder presenting with DKA, polysubstance abuse presenting with SIRS, hypotension.  Patient's general condition improved currently out of DKA sugars controlled blood pressure better has been cleared for discharge and to follow-up with primary care physician   Consultants: None Procedures performed: None Disposition: Home Diet recommendation:  Carb modified diet DISCHARGE MEDICATION: Allergies as of 07/30/2023   No Known Allergies      Medication List     STOP taking these medications    lisinopril 10 MG tablet Commonly known as: ZESTRIL       TAKE these medications    atorvastatin 40 MG tablet Commonly known as: LIPITOR TAKE 1 TABLET BY MOUTH AT BEDTIME.   buPROPion 150 MG 12 hr tablet Commonly known as: WELLBUTRIN SR Take 1 tablet (150 mg total) by mouth 2 (two) times daily.   Dilt-XR 120 MG 24 hr capsule Generic drug: diltiazem Take 1 capsule (120 mg total) by mouth daily.   gabapentin 300 MG capsule Commonly known as: NEURONTIN Take 1 capsule (300 mg total) by mouth 2 (two) times daily.   insulin glargine 100 UNIT/ML Solostar Pen Commonly known as: LANTUS Inject 25 Units into the skin 2  (two) times daily. What changed:  how much to take when to take this   Insulin Pen Needle 30G X 8 MM Misc Commonly known as: NOVOFINE Enough insulin pen needles for insulin glargine 25 units BID & novolog 15 units TID x 30 days. No refills   NovoLOG FlexPen 100 UNIT/ML FlexPen Generic drug: insulin aspart Inject 15 Units into the skin 3 (three) times daily with meals. What changed: when to take this   omeprazole 20 MG capsule Commonly known as: PRILOSEC Take 1 capsule (20 mg total) by mouth daily.   sitaGLIPtin 100 MG tablet Commonly known as: Januvia Take 1 tablet (100 mg total) by mouth daily.   vitamin B-12 500 MCG tablet Commonly known as: CYANOCOBALAMIN Take 500 mcg by mouth daily.        Discharge Exam: Filed Weights   07/28/23 1123  Weight: 72.4 kg   Appearance: She is normal weight.  HENT:     Head: Normocephalic and atraumatic.     Nose: Nose normal.     Mouth/Throat:     Mouth: Mucous membranes are dry.  Eyes:     Pupils: Pupils are equal, round, and reactive to light.  Cardiovascular:     Rate and Rhythm: Normal rate and regular rhythm.  Pulmonary:     Effort: Pulmonary effort is normal.  Abdominal:     General: Bowel sounds are normal.  Musculoskeletal:        General: Normal range of motion.  Skin:    General: Skin is warm and dry.  Neurological:     General: No focal deficit present.  Psychiatric:  Mood and Affect: Mood normal.   Condition at discharge: good  The results of significant diagnostics from this hospitalization (including imaging, microbiology, ancillary and laboratory) are listed below for reference.   Imaging Studies: CT CHEST ABDOMEN PELVIS W CONTRAST Result Date: 07/28/2023 CLINICAL DATA:  Sepsis with UTI symptoms and lethargy. EXAM: CT CHEST, ABDOMEN, AND PELVIS WITH CONTRAST TECHNIQUE: Multidetector CT imaging of the chest, abdomen and pelvis was performed following the standard protocol during bolus administration  of intravenous contrast. RADIATION DOSE REDUCTION: This exam was performed according to the departmental dose-optimization program which includes automated exposure control, adjustment of the mA and/or kV according to patient size and/or use of iterative reconstruction technique. CONTRAST:  80mL OMNIPAQUE IOHEXOL 300 MG/ML  SOLN COMPARISON:  Abdominopelvic CT 08/21/2022. FINDINGS: CT CHEST FINDINGS Cardiovascular: Atherosclerosis of the aorta, great vessels and coronary arteries. No acute vascular findings. The heart size is normal. There is no pericardial effusion. Mediastinum/Nodes: There are no enlarged mediastinal, hilar or axillary lymph nodes. The thyroid gland, trachea and esophagus demonstrate no significant findings. Lungs/Pleura: No pleural effusion or pneumothorax. Mild centrilobular and paraseptal emphysema with mild diffuse central airway thickening. No focal airspace disease or suspicious pulmonary nodularity. Musculoskeletal/Chest wall: No chest wall mass or suspicious osseous findings. CT ABDOMEN AND PELVIS FINDINGS Hepatobiliary: The liver is normal in density without suspicious focal abnormality. Ill-defined low-density inferiorly in the right hepatic lobe adjacent to the gallbladder on image 74/2 is unchanged, possibly focal fat. No evidence of gallstones, gallbladder wall thickening or biliary dilatation. Pancreas: Unremarkable. No pancreatic ductal dilatation or surrounding inflammatory changes. Spleen: Normal in size without focal abnormality. Adrenals/Urinary Tract: Both adrenal glands appear normal. No evidence of urinary tract calculus, suspicious renal lesion or hydronephrosis. Mild right renal cortical scarring. The bladder appears unremarkable for its degree of distention. Stomach/Bowel: No enteric contrast administered. The stomach appears unremarkable for its degree of distension. No evidence of bowel wall thickening, distention or surrounding inflammatory change. The appendix appears  normal. Moderate stool throughout the colon. Vascular/Lymphatic: There are no enlarged abdominal or pelvic lymph nodes. Aortic and branch vessel atherosclerosis. No evidence of aneurysm or large vessel occlusion. Reproductive: Hysterectomy.  No adnexal mass. Other: No evidence of abdominal wall mass or hernia. No ascites or pneumoperitoneum. Musculoskeletal: No acute or significant osseous findings. Chronic degenerative disc disease at L5-S1 with asymmetric right hip degenerative changes. IMPRESSION: 1. No acute findings or explanation for the patient's symptoms. 2. Moderate stool throughout the colon suggesting constipation. 3. Aortic Atherosclerosis (ICD10-I70.0) and Emphysema (ICD10-J43.9). Electronically Signed   By: Carey Bullocks M.D.   On: 07/28/2023 15:03   DG Chest Port 1 View Result Date: 07/28/2023 CLINICAL DATA:  Sepsis. EXAM: PORTABLE CHEST 1 VIEW COMPARISON:  April 11, 2023. FINDINGS: The heart size and mediastinal contours are within normal limits. Both lungs are clear. The visualized skeletal structures are unremarkable. IMPRESSION: No active disease. Electronically Signed   By: Lupita Raider M.D.   On: 07/28/2023 12:31    Microbiology: Results for orders placed or performed during the hospital encounter of 07/28/23  Resp panel by RT-PCR (RSV, Flu A&B, Covid) Anterior Nasal Swab     Status: None   Collection Time: 07/28/23 11:40 AM   Specimen: Anterior Nasal Swab  Result Value Ref Range Status   SARS Coronavirus 2 by RT PCR NEGATIVE NEGATIVE Final    Comment: (NOTE) SARS-CoV-2 target nucleic acids are NOT DETECTED.  The SARS-CoV-2 RNA is generally detectable in upper respiratory specimens during the  acute phase of infection. The lowest concentration of SARS-CoV-2 viral copies this assay can detect is 138 copies/mL. A negative result does not preclude SARS-Cov-2 infection and should not be used as the sole basis for treatment or other patient management decisions. A  negative result may occur with  improper specimen collection/handling, submission of specimen other than nasopharyngeal swab, presence of viral mutation(s) within the areas targeted by this assay, and inadequate number of viral copies(<138 copies/mL). A negative result must be combined with clinical observations, patient history, and epidemiological information. The expected result is Negative.  Fact Sheet for Patients:  BloggerCourse.com  Fact Sheet for Healthcare Providers:  SeriousBroker.it  This test is no t yet approved or cleared by the [place] FDA and  has been authorized for detection and/or diagnosis of SARS-CoV-2 by FDA under an Emergency Use Authorization (EUA). This EUA will remain  in effect (meaning this test can be used) for the duration of the COVID-19 declaration under Section 564(b)(1) of the Act, 21 U.S.C.section 360bbb-3(b)(1), unless the authorization is terminated  or revoked sooner.       Influenza A by PCR NEGATIVE NEGATIVE Final   Influenza B by PCR NEGATIVE NEGATIVE Final    Comment: (NOTE) The Xpert Xpress SARS-CoV-2/FLU/RSV plus assay is intended as an aid in the diagnosis of influenza from Nasopharyngeal swab specimens and should not be used as a sole basis for treatment. Nasal washings and aspirates are unacceptable for Xpert Xpress SARS-CoV-2/FLU/RSV testing.  Fact Sheet for Patients: BloggerCourse.com  Fact Sheet for Healthcare Providers: SeriousBroker.it  This test is not yet approved or cleared by the [place] FDA and has been authorized for detection and/or diagnosis of SARS-CoV-2 by FDA under an Emergency Use Authorization (EUA). This EUA will remain in effect (meaning this test can be used) for the duration of the COVID-19 declaration under Section 564(b)(1) of the Act, 21 U.S.C. section 360bbb-3(b)(1), unless the authorization  is terminated or revoked.     Resp Syncytial Virus by PCR NEGATIVE NEGATIVE Final    Comment: (NOTE) Fact Sheet for Patients: BloggerCourse.com  Fact Sheet for Healthcare Providers: SeriousBroker.it  This test is not yet approved or cleared by the [place] FDA and has been authorized for detection and/or diagnosis of SARS-CoV-2 by FDA under an Emergency Use Authorization (EUA). This EUA will remain in effect (meaning this test can be used) for the duration of the COVID-19 declaration under Section 564(b)(1) of the Act, 21 U.S.C. section 360bbb-3(b)(1), unless the authorization is terminated or revoked.  Performed at [facility], [address] [place], [place] [address]   Blood Culture (routine x 2)     Status: None (Preliminary result)   Collection Time: 07/28/23 11:40 AM   Specimen: BLOOD  Result Value Ref Range Status   Specimen Description BLOOD BLOOD RIGHT HAND  Final   Special Requests   Final    BOTTLES DRAWN AEROBIC AND ANAEROBIC Blood Culture results may not be optimal due to an inadequate volume of blood received in culture bottles   Culture  Setup Time   Final    GRAM POSITIVE RODS AEROBIC BOTTLE ONLY CRITICAL RESULT CALLED TO, READ BACK BY AND VERIFIED WITH: RODNEY GRUBB AT 9381 07/30/23.PMF Performed at [facility], [address] [place], [place] [address]    Culture GRAM POSITIVE RODS  Final   Report Status PENDING  Incomplete  Blood Culture (routine x 2)     Status: None (Preliminary result)   Collection Time: 07/28/23  12:39 PM   Specimen: BLOOD  Result Value Ref Range Status   Specimen Description BLOOD BLOOD LEFT ARM  Final   Special Requests   Final    BOTTLES DRAWN AEROBIC AND ANAEROBIC Blood Culture adequate volume   Culture   Final    NO GROWTH 2 DAYS Performed at [facility], [address], [place], [place] [address]    Report Status PENDING  Incomplete   Urine Culture     Status: Abnormal   Collection Time: 07/28/23  2:29 PM   Specimen: Urine, Random  Result Value Ref Range Status   Specimen Description   Final    URINE, RANDOM Performed at [facility], [address], [place], [place] [address]    Special Requests   Final    NONE Reflexed from [id number] Performed at [facility], [address] [place], [place] [address]    Culture (A)  Final    <10,000 COLONIES/mL INSIGNIFICANT GROWTH Performed at [facility], [address] [place], [place] [address]    Report Status 07/29/2023 FINAL  Final    Labs: CBC: Recent Labs  Lab 07/28/23 1140 07/29/23 0117 07/30/23 0414  WBC 13.2* 7.1 6.3  NEUTROABS 9.6*  --  3.7  HGB 15.2* 12.6 12.9  HCT 45.3 36.5 36.4  MCV 88.6 87.1 85.4  PLT 347 231 236   Basic Metabolic Panel: Recent Labs  Lab 07/28/23 1239 07/29/23 0117 07/29/23 0832 07/30/23 0414  NA 137 129* 136 136  K 3.6 4.1 3.3* 3.7  CL 104 99 105 104  CO2 19* 18* 21* 21*  GLUCOSE 154* 608* 362* 190*  BUN 33* 25* 18 14  CREATININE 1.44* 1.15* 0.88 0.73  CALCIUM 9.0 8.6* 8.7* 8.8*  MG  --  1.9  --   --   PHOS  --  3.3  --   --    Liver Function Tests: Recent Labs  Lab 07/28/23 1239 07/29/23 0117  AST 14* 12*  ALT 13 11  ALKPHOS 84 80  BILITOT 0.4 0.5  PROT 7.5 6.7  ALBUMIN 3.8 3.6   CBG: Recent Labs  Lab 07/29/23 1138 07/29/23 2106 07/30/23 0207 07/30/23 0939 07/30/23 1219  GLUCAP 290* 319* 188* 295* 309*    Discharge time spent:  34 minutes.  Signed: Loyce Dys, MD [facility] 07/30/2023

## 2023-07-30 NOTE — Progress Notes (Signed)
Patient being discharged home. PIV removed. Patient stated that she did not have a ride home and needed a cab. Caseworker took care of same and cab voucher was given to patient. Went over discharge instructions and medications with patient. Patient stated that she understands and all questions were answered. Patient refused to wait in room for cab to arrive, also refused wheelchair. Patient walked to [facility]. Patient will be going home via [facility].

## 2023-07-30 NOTE — Plan of Care (Signed)
  Problem: Education: Goal: Ability to describe self-care measures that may prevent or decrease complications (Diabetes Survival Skills Education) will improve Outcome: Progressing Goal: Individualized Educational Video(s) Outcome: Progressing   Problem: Coping: Goal: Ability to adjust to condition or change in health will improve Outcome: Progressing   Problem: Fluid Volume: Goal: Ability to maintain a balanced intake and output will improve Outcome: Progressing   Problem: Health Behavior/Discharge Planning: Goal: Ability to identify and utilize available resources and services will improve Outcome: Progressing Goal: Ability to manage health-related needs will improve Outcome: Progressing   Problem: Metabolic: Goal: Ability to maintain appropriate glucose levels will improve Outcome: Progressing   Problem: Nutritional: Goal: Maintenance of adequate nutrition will improve Outcome: Progressing Goal: Progress toward achieving an optimal weight will improve Outcome: Progressing   Problem: Skin Integrity: Goal: Risk for impaired skin integrity will decrease Outcome: Progressing   Problem: Tissue Perfusion: Goal: Adequacy of tissue perfusion will improve Outcome: Progressing   Problem: Education: Goal: Ability to describe self-care measures that may prevent or decrease complications (Diabetes Survival Skills Education) will improve Outcome: Progressing Goal: Individualized Educational Video(s) Outcome: Progressing   Problem: Cardiac: Goal: Ability to maintain an adequate cardiac output will improve Outcome: Progressing   Problem: Health Behavior/Discharge Planning: Goal: Ability to identify and utilize available resources and services will improve Outcome: Progressing Goal: Ability to manage health-related needs will improve Outcome: Progressing   Problem: Fluid Volume: Goal: Ability to achieve a balanced intake and output will improve Outcome: Progressing    Problem: Metabolic: Goal: Ability to maintain appropriate glucose levels will improve Outcome: Progressing   Problem: Nutritional: Goal: Maintenance of adequate nutrition will improve Outcome: Progressing Goal: Maintenance of adequate weight for body size and type will improve Outcome: Progressing   Problem: Respiratory: Goal: Will regain and/or maintain adequate ventilation Outcome: Progressing   Problem: Urinary Elimination: Goal: Ability to achieve and maintain adequate renal perfusion and functioning will improve Outcome: Progressing   Problem: Education: Goal: Knowledge of General Education information will improve Description: Including pain rating scale, medication(s)/side effects and non-pharmacologic comfort measures Outcome: Progressing   Problem: Health Behavior/Discharge Planning: Goal: Ability to manage health-related needs will improve Outcome: Progressing   Problem: Clinical Measurements: Goal: Ability to maintain clinical measurements within normal limits will improve Outcome: Progressing Goal: Will remain free from infection Outcome: Progressing Goal: Diagnostic test results will improve Outcome: Progressing Goal: Respiratory complications will improve Outcome: Progressing Goal: Cardiovascular complication will be avoided Outcome: Progressing

## 2023-08-02 LAB — CULTURE, BLOOD (ROUTINE X 2)
Culture: NO GROWTH
Special Requests: ADEQUATE

## 2023-08-03 LAB — CULTURE, BLOOD (ROUTINE X 2): Culture  Setup Time: NO GROWTH

## 2023-09-12 ENCOUNTER — Emergency Department: Payer: MEDICAID

## 2023-09-12 ENCOUNTER — Inpatient Hospital Stay
Admission: EM | Admit: 2023-09-12 | Discharge: 2023-09-16 | DRG: 871 | Disposition: A | Discharge status: Home / Self Care | Payer: MEDICAID | Attending: Internal Medicine | Admitting: Internal Medicine

## 2023-09-12 ENCOUNTER — Other Ambulatory Visit: Payer: Self-pay

## 2023-09-12 ENCOUNTER — Inpatient Hospital Stay: Payer: MEDICAID

## 2023-09-12 DIAGNOSIS — F1010 Alcohol abuse, uncomplicated: Secondary | ICD-10-CM | POA: Diagnosis present

## 2023-09-12 DIAGNOSIS — E785 Hyperlipidemia, unspecified: Secondary | ICD-10-CM | POA: Diagnosis present

## 2023-09-12 DIAGNOSIS — E0810 Diabetes mellitus due to underlying condition with ketoacidosis without coma: Secondary | ICD-10-CM | POA: Diagnosis not present

## 2023-09-12 DIAGNOSIS — Z90710 Acquired absence of both cervix and uterus: Secondary | ICD-10-CM

## 2023-09-12 DIAGNOSIS — N390 Urinary tract infection, site not specified: Secondary | ICD-10-CM | POA: Diagnosis present

## 2023-09-12 DIAGNOSIS — E1110 Type 2 diabetes mellitus with ketoacidosis without coma: Secondary | ICD-10-CM | POA: Diagnosis present

## 2023-09-12 DIAGNOSIS — F319 Bipolar disorder, unspecified: Secondary | ICD-10-CM

## 2023-09-12 DIAGNOSIS — E1122 Type 2 diabetes mellitus with diabetic chronic kidney disease: Secondary | ICD-10-CM | POA: Diagnosis present

## 2023-09-12 DIAGNOSIS — E1165 Type 2 diabetes mellitus with hyperglycemia: Secondary | ICD-10-CM | POA: Diagnosis not present

## 2023-09-12 DIAGNOSIS — F1210 Cannabis abuse, uncomplicated: Secondary | ICD-10-CM | POA: Diagnosis present

## 2023-09-12 DIAGNOSIS — R7881 Bacteremia: Secondary | ICD-10-CM | POA: Diagnosis not present

## 2023-09-12 DIAGNOSIS — R680 Hypothermia, not associated with low environmental temperature: Secondary | ICD-10-CM | POA: Diagnosis present

## 2023-09-12 DIAGNOSIS — I10 Essential (primary) hypertension: Secondary | ICD-10-CM

## 2023-09-12 DIAGNOSIS — Z1152 Encounter for screening for COVID-19: Secondary | ICD-10-CM

## 2023-09-12 DIAGNOSIS — N179 Acute kidney failure, unspecified: Secondary | ICD-10-CM | POA: Diagnosis present

## 2023-09-12 DIAGNOSIS — Z91148 Patient's other noncompliance with medication regimen for other reason: Secondary | ICD-10-CM | POA: Diagnosis not present

## 2023-09-12 DIAGNOSIS — Z8249 Family history of ischemic heart disease and other diseases of the circulatory system: Secondary | ICD-10-CM | POA: Diagnosis not present

## 2023-09-12 DIAGNOSIS — A419 Sepsis, unspecified organism: Principal | ICD-10-CM

## 2023-09-12 DIAGNOSIS — I471 Supraventricular tachycardia, unspecified: Secondary | ICD-10-CM

## 2023-09-12 DIAGNOSIS — Z794 Long term (current) use of insulin: Secondary | ICD-10-CM | POA: Diagnosis not present

## 2023-09-12 DIAGNOSIS — E875 Hyperkalemia: Secondary | ICD-10-CM | POA: Diagnosis present

## 2023-09-12 DIAGNOSIS — G9341 Metabolic encephalopathy: Secondary | ICD-10-CM | POA: Diagnosis present

## 2023-09-12 DIAGNOSIS — A4150 Gram-negative sepsis, unspecified: Secondary | ICD-10-CM | POA: Diagnosis not present

## 2023-09-12 DIAGNOSIS — T68XXXA Hypothermia, initial encounter: Secondary | ICD-10-CM

## 2023-09-12 DIAGNOSIS — F419 Anxiety disorder, unspecified: Secondary | ICD-10-CM | POA: Diagnosis present

## 2023-09-12 DIAGNOSIS — E66811 Obesity, class 1: Secondary | ICD-10-CM | POA: Diagnosis not present

## 2023-09-12 DIAGNOSIS — R0600 Dyspnea, unspecified: Secondary | ICD-10-CM

## 2023-09-12 DIAGNOSIS — E871 Hypo-osmolality and hyponatremia: Secondary | ICD-10-CM | POA: Diagnosis present

## 2023-09-12 DIAGNOSIS — F14129 Cocaine abuse with intoxication, unspecified: Secondary | ICD-10-CM | POA: Diagnosis present

## 2023-09-12 DIAGNOSIS — I129 Hypertensive chronic kidney disease with stage 1 through stage 4 chronic kidney disease, or unspecified chronic kidney disease: Secondary | ICD-10-CM | POA: Diagnosis present

## 2023-09-12 DIAGNOSIS — R0902 Hypoxemia: Secondary | ICD-10-CM | POA: Diagnosis present

## 2023-09-12 DIAGNOSIS — F17210 Nicotine dependence, cigarettes, uncomplicated: Secondary | ICD-10-CM | POA: Diagnosis present

## 2023-09-12 DIAGNOSIS — J45909 Unspecified asthma, uncomplicated: Secondary | ICD-10-CM | POA: Diagnosis present

## 2023-09-12 DIAGNOSIS — N1831 Chronic kidney disease, stage 3a: Secondary | ICD-10-CM

## 2023-09-12 DIAGNOSIS — Z7984 Long term (current) use of oral hypoglycemic drugs: Secondary | ICD-10-CM

## 2023-09-12 DIAGNOSIS — N182 Chronic kidney disease, stage 2 (mild): Secondary | ICD-10-CM | POA: Diagnosis not present

## 2023-09-12 DIAGNOSIS — Z6831 Body mass index (BMI) 31.0-31.9, adult: Secondary | ICD-10-CM | POA: Diagnosis not present

## 2023-09-12 DIAGNOSIS — Z79899 Other long term (current) drug therapy: Secondary | ICD-10-CM

## 2023-09-12 DIAGNOSIS — E6609 Other obesity due to excess calories: Secondary | ICD-10-CM | POA: Diagnosis not present

## 2023-09-12 DIAGNOSIS — K859 Acute pancreatitis without necrosis or infection, unspecified: Secondary | ICD-10-CM | POA: Diagnosis present

## 2023-09-12 DIAGNOSIS — R0602 Shortness of breath: Secondary | ICD-10-CM | POA: Diagnosis present

## 2023-09-12 LAB — BLOOD GAS, VENOUS
Acid-base deficit: 25.7 mmol/L — ABNORMAL HIGH (ref 0.0–2.0)
Acid-base deficit: 21.4 mmol/L — ABNORMAL HIGH (ref 0.0–2.0)
Bicarbonate: 4.5 mmol/L — ABNORMAL LOW (ref 20.0–28.0)
Bicarbonate: 7.1 mmol/L — ABNORMAL LOW (ref 20.0–28.0)
O2 Saturation: 75.5 %
O2 Saturation: 64.5 %
Patient temperature: 37
Patient temperature: 37
pCO2, Ven: 19 mmHg — CL (ref 44–60)
pCO2, Ven: 24 mmHg — ABNORMAL LOW (ref 44–60)
pH, Ven: 6.98 — CL (ref 7.25–7.43)
pH, Ven: 7.08 — CL (ref 7.25–7.43)
pO2, Ven: 55 mmHg — ABNORMAL HIGH (ref 32–45)
pO2, Ven: 40 mmHg (ref 32–45)

## 2023-09-12 LAB — BLOOD CULTURE ID PANEL (REFLEXED) - BCID2

## 2023-09-12 LAB — COMPREHENSIVE METABOLIC PANEL
ALT: 20 U/L (ref 0–44)
AST: 14 U/L — ABNORMAL LOW (ref 15–41)
Albumin: 4.8 g/dL (ref 3.5–5.0)
Alkaline Phosphatase: 150 U/L — ABNORMAL HIGH (ref 38–126)
BUN: 53 mg/dL — ABNORMAL HIGH (ref 8–23)
CO2: <7 mmol/L — ABNORMAL LOW (ref 22–32)
Calcium: 9.7 mg/dL (ref 8.9–10.3)
Chloride: 87 mmol/L — ABNORMAL LOW (ref 98–111)
Creatinine, Ser: 2.69 mg/dL — ABNORMAL HIGH (ref 0.44–1.00)
GFR, Estimated: 19 mL/min — ABNORMAL LOW (ref >60)
Glucose, Bld: 1109 mg/dL (ref 70–99)
Potassium: 6.5 mmol/L (ref 3.5–5.1)
Sodium: 122 mmol/L — ABNORMAL LOW (ref 135–145)
Total Bilirubin: 2.1 mg/dL — ABNORMAL HIGH (ref 0.0–1.2)
Total Protein: 9.5 g/dL — ABNORMAL HIGH (ref 6.5–8.1)

## 2023-09-12 LAB — GLUCOSE, CAPILLARY
Glucose-Capillary: >600 mg/dL (ref 70–99)
Glucose-Capillary: >600 mg/dL (ref 70–99)
Glucose-Capillary: >600 mg/dL (ref 70–99)
Glucose-Capillary: 545 mg/dL (ref 70–99)
Glucose-Capillary: 430 mg/dL — ABNORMAL HIGH (ref 70–99)
Glucose-Capillary: 312 mg/dL — ABNORMAL HIGH (ref 70–99)
Glucose-Capillary: 417 mg/dL — ABNORMAL HIGH (ref 70–99)
Glucose-Capillary: 277 mg/dL — ABNORMAL HIGH (ref 70–99)
Glucose-Capillary: 247 mg/dL — ABNORMAL HIGH (ref 70–99)
Glucose-Capillary: 231 mg/dL — ABNORMAL HIGH (ref 70–99)
Glucose-Capillary: 294 mg/dL — ABNORMAL HIGH (ref 70–99)
Glucose-Capillary: 207 mg/dL — ABNORMAL HIGH (ref 70–99)
Glucose-Capillary: 193 mg/dL — ABNORMAL HIGH (ref 70–99)
Glucose-Capillary: 237 mg/dL — ABNORMAL HIGH (ref 70–99)
Glucose-Capillary: 203 mg/dL — ABNORMAL HIGH (ref 70–99)
Glucose-Capillary: 198 mg/dL — ABNORMAL HIGH (ref 70–99)

## 2023-09-12 LAB — CBC WITH DIFFERENTIAL/PLATELET
Abs Immature Granulocytes: 0.14 10*3/uL — ABNORMAL HIGH (ref 0.00–0.07)
Basophils Absolute: 0.1 10*3/uL (ref 0.0–0.1)
Basophils Relative: 0 %
Eosinophils Absolute: 0.6 10*3/uL — ABNORMAL HIGH (ref 0.0–0.5)
Eosinophils Relative: 3 %
HCT: 47.9 % — ABNORMAL HIGH (ref 36.0–46.0)
Hemoglobin: 14.6 g/dL (ref 12.0–15.0)
Immature Granulocytes: 1 %
Lymphocytes Relative: 3 %
Lymphs Abs: 0.6 10*3/uL — ABNORMAL LOW (ref 0.7–4.0)
MCH: 29.4 pg (ref 26.0–34.0)
MCHC: 30.5 g/dL (ref 30.0–36.0)
MCV: 96.6 fL (ref 80.0–100.0)
Monocytes Absolute: 1.6 10*3/uL — ABNORMAL HIGH (ref 0.1–1.0)
Monocytes Relative: 8 %
Neutro Abs: 18 10*3/uL — ABNORMAL HIGH (ref 1.7–7.7)
Neutrophils Relative %: 85 %
Platelets: 423 10*3/uL — ABNORMAL HIGH (ref 150–400)
RBC: 4.96 MIL/uL (ref 3.87–5.11)
RDW: 13.2 % (ref 11.5–15.5)
Smear Review: NORMAL
WBC: 21 10*3/uL — ABNORMAL HIGH (ref 4.0–10.5)
nRBC: 0 % (ref 0.0–0.2)

## 2023-09-12 LAB — RESP PANEL BY RT-PCR (RSV, FLU A&B, COVID)  RVPGX2
Influenza A by PCR: NEGATIVE
Influenza B by PCR: NEGATIVE
Resp Syncytial Virus by PCR: NEGATIVE
SARS Coronavirus 2 by RT PCR: NEGATIVE

## 2023-09-12 LAB — LACTIC ACID, PLASMA
Lactic Acid, Venous: 3.5 mmol/L (ref 0.5–1.9)
Lactic Acid, Venous: 2.9 mmol/L (ref 0.5–1.9)

## 2023-09-12 LAB — TROPONIN I (HIGH SENSITIVITY)
Troponin I (High Sensitivity): 12 ng/L (ref <18)
Troponin I (High Sensitivity): 9 ng/L (ref <18)

## 2023-09-12 LAB — URINE DRUG SCREEN, QUALITATIVE (ARMC ONLY)
Amphetamines, Ur Screen: NOT DETECTED
Barbiturates, Ur Screen: NOT DETECTED
Benzodiazepine, Ur Scrn: NOT DETECTED
Cannabinoid 50 Ng, Ur ~~LOC~~: NOT DETECTED
Cocaine Metabolite,Ur ~~LOC~~: POSITIVE — AB
MDMA (Ecstasy)Ur Screen: NOT DETECTED
Methadone Scn, Ur: NOT DETECTED
Opiate, Ur Screen: NOT DETECTED
Phencyclidine (PCP) Ur S: NOT DETECTED
Tricyclic, Ur Screen: NOT DETECTED

## 2023-09-12 LAB — BASIC METABOLIC PANEL
Anion gap: 18 — ABNORMAL HIGH (ref 5–15)
Anion gap: 13 (ref 5–15)
Anion gap: 15 (ref 5–15)
BUN: 47 mg/dL — ABNORMAL HIGH (ref 8–23)
BUN: 45 mg/dL — ABNORMAL HIGH (ref 8–23)
BUN: 42 mg/dL — ABNORMAL HIGH (ref 8–23)
BUN: 38 mg/dL — ABNORMAL HIGH (ref 8–23)
CO2: <7 mmol/L — ABNORMAL LOW (ref 22–32)
CO2: 15 mmol/L — ABNORMAL LOW (ref 22–32)
CO2: 18 mmol/L — ABNORMAL LOW (ref 22–32)
CO2: 17 mmol/L — ABNORMAL LOW (ref 22–32)
Calcium: 9 mg/dL (ref 8.9–10.3)
Calcium: 9.4 mg/dL (ref 8.9–10.3)
Calcium: 9.1 mg/dL (ref 8.9–10.3)
Calcium: 8.9 mg/dL (ref 8.9–10.3)
Chloride: 96 mmol/L — ABNORMAL LOW (ref 98–111)
Chloride: 102 mmol/L (ref 98–111)
Chloride: 102 mmol/L (ref 98–111)
Chloride: 103 mmol/L (ref 98–111)
Creatinine, Ser: 2.49 mg/dL — ABNORMAL HIGH (ref 0.44–1.00)
Creatinine, Ser: 2.01 mg/dL — ABNORMAL HIGH (ref 0.44–1.00)
Creatinine, Ser: 1.55 mg/dL — ABNORMAL HIGH (ref 0.44–1.00)
Creatinine, Ser: 1.3 mg/dL — ABNORMAL HIGH (ref 0.44–1.00)
GFR, Estimated: 21 mL/min — ABNORMAL LOW (ref >60)
GFR, Estimated: 28 mL/min — ABNORMAL LOW (ref >60)
GFR, Estimated: 38 mL/min — ABNORMAL LOW (ref >60)
GFR, Estimated: 46 mL/min — ABNORMAL LOW (ref >60)
Glucose, Bld: 805 mg/dL (ref 70–99)
Glucose, Bld: 368 mg/dL — ABNORMAL HIGH (ref 70–99)
Glucose, Bld: 260 mg/dL — ABNORMAL HIGH (ref 70–99)
Glucose, Bld: 202 mg/dL — ABNORMAL HIGH (ref 70–99)
Potassium: 5.2 mmol/L — ABNORMAL HIGH (ref 3.5–5.1)
Potassium: 4.5 mmol/L (ref 3.5–5.1)
Potassium: 4 mmol/L (ref 3.5–5.1)
Potassium: 3.9 mmol/L (ref 3.5–5.1)
Sodium: 128 mmol/L — ABNORMAL LOW (ref 135–145)
Sodium: 135 mmol/L (ref 135–145)
Sodium: 133 mmol/L — ABNORMAL LOW (ref 135–145)
Sodium: 135 mmol/L (ref 135–145)

## 2023-09-12 LAB — URINALYSIS, ROUTINE W REFLEX MICROSCOPIC
Bilirubin Urine: NEGATIVE
Glucose, UA: >=500 mg/dL — AB
Ketones, ur: 20 mg/dL — AB
Nitrite: NEGATIVE
Protein, ur: 30 mg/dL — AB
Specific Gravity, Urine: 1.021 (ref 1.005–1.030)
WBC, UA: >50 WBC/hpf (ref 0–5)
pH: 7 (ref 5.0–8.0)

## 2023-09-12 LAB — RESPIRATORY PANEL BY PCR

## 2023-09-12 LAB — BRAIN NATRIURETIC PEPTIDE: B Natriuretic Peptide: 183 pg/mL — ABNORMAL HIGH (ref 0.0–100.0)

## 2023-09-12 LAB — CBG MONITORING, ED
Glucose-Capillary: >600 mg/dL (ref 70–99)
Glucose-Capillary: >600 mg/dL (ref 70–99)
Glucose-Capillary: >600 mg/dL (ref 70–99)

## 2023-09-12 LAB — BETA-HYDROXYBUTYRIC ACID
Beta-Hydroxybutyric Acid: >8 mmol/L — ABNORMAL HIGH (ref 0.05–0.27)
Beta-Hydroxybutyric Acid: 5.52 mmol/L — ABNORMAL HIGH (ref 0.05–0.27)
Beta-Hydroxybutyric Acid: 2.25 mmol/L — ABNORMAL HIGH (ref 0.05–0.27)
Beta-Hydroxybutyric Acid: 1.24 mmol/L — ABNORMAL HIGH (ref 0.05–0.27)

## 2023-09-12 LAB — MRSA NEXT GEN BY PCR, NASAL: MRSA by PCR Next Gen: NOT DETECTED

## 2023-09-12 LAB — ETHANOL: Alcohol, Ethyl (B): <10 mg/dL (ref <10)

## 2023-09-12 LAB — OSMOLALITY: Osmolality: 366 mosm/kg (ref 275–295)

## 2023-09-12 LAB — PROCALCITONIN: Procalcitonin: 0.26 ng/mL

## 2023-09-12 LAB — LIPASE, BLOOD: Lipase: 200 U/L — ABNORMAL HIGH (ref 11–51)

## 2023-09-12 MED ORDER — LACTATED RINGERS IV BOLUS
500.0000 mL | Freq: Once | INTRAVENOUS | Status: AC
  Administered 2023-09-12: 500 mL via INTRAVENOUS

## 2023-09-12 MED ORDER — HEPARIN SODIUM (PORCINE) 5000 UNIT/ML IJ SOLN
5000.0000 [IU] | Freq: Three times a day (TID) | INTRAMUSCULAR | Status: DC
  Administered 2023-09-12 – 2023-09-16 (×13): 5000 [IU] via SUBCUTANEOUS
  Filled 2023-09-12 (×13): qty 1

## 2023-09-12 MED ORDER — DEXTROSE IN LACTATED RINGERS 5 % IV SOLN: INTRAVENOUS | Status: DC

## 2023-09-12 MED ORDER — LACTATED RINGERS IV SOLN: INTRAVENOUS | Status: DC

## 2023-09-12 MED ORDER — DOCUSATE SODIUM 100 MG PO CAPS: 100.0000 mg | ORAL_CAPSULE | Freq: Two times a day (BID) | ORAL | Status: DC | PRN

## 2023-09-12 MED ORDER — SODIUM CHLORIDE 0.9 % IV SOLN
1.0000 g | INTRAVENOUS | Status: DC
  Administered 2023-09-12 – 2023-09-13 (×2): 1 g via INTRAVENOUS
  Filled 2023-09-12 (×2): qty 10

## 2023-09-12 MED ORDER — ORAL CARE MOUTH RINSE: 15.0000 mL | OROMUCOSAL | Status: DC | PRN

## 2023-09-12 MED ORDER — LACTATED RINGERS IV BOLUS
1000.0000 mL | Freq: Once | INTRAVENOUS | Status: AC
  Administered 2023-09-12: 1000 mL via INTRAVENOUS

## 2023-09-12 MED ORDER — POLYETHYLENE GLYCOL 3350 17 G PO PACK: 17.0000 g | PACK | Freq: Every day | ORAL | Status: DC | PRN

## 2023-09-12 MED ORDER — CHLORHEXIDINE GLUCONATE CLOTH 2 % EX PADS
6.0000 | MEDICATED_PAD | Freq: Every day | CUTANEOUS | Status: DC
  Administered 2023-09-12 – 2023-09-15 (×4): 6 via TOPICAL

## 2023-09-12 MED ORDER — INSULIN REGULAR(HUMAN) IN NACL 100-0.9 UT/100ML-% IV SOLN
INTRAVENOUS | Status: DC
  Administered 2023-09-12: 4.6 [IU]/h via INTRAVENOUS
  Administered 2023-09-12: 10 [IU]/h via INTRAVENOUS
  Filled 2023-09-12 (×2): qty 100

## 2023-09-12 MED ORDER — DEXTROSE 50 % IV SOLN: 0.0000 mL | INTRAVENOUS | Status: DC | PRN

## 2023-09-12 MED ORDER — METRONIDAZOLE 500 MG/100ML IV SOLN
500.0000 mg | Freq: Two times a day (BID) | INTRAVENOUS | Status: DC
  Administered 2023-09-12 – 2023-09-13 (×3): 500 mg via INTRAVENOUS
  Filled 2023-09-12 (×3): qty 100

## 2023-09-12 NOTE — H&P (Signed)
 NAME:  Pamela Lane, MRN:  [id number], DOB:  08-28-1960, LOS: 0 ADMISSION DATE:  09/12/2023, CONSULTATION DATE: 09/12/2023 REFERRING MD: Loleta Rose, CHIEF COMPLAINT: shortness of breath hyperglycemia   HPI  63 y.o female with significant PMH of uncontrolled diabetes mellitus, recurrent DKA, hypertension, hyperlipidemia, asthma, anxiety and depression, bipolar disorder, CKD stage III, SVT, C. difficile colitis and polysubstance abuse who presented to the ED with chief complaints of breath and hypoxia with a glucose reading of high per EMS.   ED Course: Initial vital signs showed HR of  102 beats/minute, BP 149/62 mm Hg, the RR 32 breaths/minute, and the oxygen saturation 100 % on RA and a temperature of 95.59F (35.1C). Pertinent Labs/Diagnostics Findings: Na+/ K+:122/6.5  Glucose: 1109 BUN/Cr.:53/2.69, CO2 less than 7, anion gap and not calculated WBC: 21 K/L PCT: negative <0.10  Lactic acid: Pending Beta hydroxybutyrate >8.00 COVID PCR: Negative,  VBG: pO2 55; pCO2 19; pH 6.98;  HCO3 4.5, %O2 Sat 75.5.  CXR> Negative  The laboratory data consistent with the diagnosis of DKA. Urinalysis and chest xray demonstrated no evidence of infection. The patient's hemoglobin A1c (A1C) pending. The management was initiated with initial intravenous fluid  followed by intravenous insulin infusion as per DKA protocol. PCCM consulted to admit for further management of DKA.  Past Medical History  uncontrolled diabetes mellitus, recurrent DKA, hypertension, hyperlipidemia, asthma, anxiety and depression, bipolar disorder, CKD stage III, SVT, C. difficile colitis and polysubstance abuse  Significant Hospital Events   3/3: Admitted to ICU with DKA  Consults:  Diabetes coordinator  Procedures:  None  Significant Diagnostic Tests:  09/12/23: Chest Xray> IMPRESSION: No active disease.  Interim History / Subjective:    -  Micro Data:  3/3: SARS-CoV-2 PCR> negative 3/3: Influenza PCR>  negative 3/3: Blood culture x2> 3/3: MRSA PCR>>   Antimicrobials:  Ceftriaxone  OBJECTIVE  Blood pressure (!) 149/62, pulse 71, temperature (!) 95.1 F (35.1 C), temperature source Rectal, resp. rate (!) 28, height 5\' 3"  (1.6 m), weight 79.4 kg, SpO2 100%.     No intake or output data in the 24 hours ending 09/12/23 0548 Filed Weights   09/12/23 0530  Weight: 79.4 kg   Physical Examination  GENERAL: 63 year-old critically ill patient lying in the bed with kussmal type breathing EYES: PEERLA. No scleral icterus. Extraocular muscles intact.  HEENT: Head atraumatic, normocephalic. Oropharynx and nasopharynx clear.  NECK:  No JVD, supple  LUNGS: Decreased breath sounds bilaterally.  mild use of accessory muscles of respiration.  CARDIOVASCULAR: S1, S2 normal. No murmurs, rubs, or gallops.  ABDOMEN: Soft, NTND EXTREMITIES: No swelling or erythema.  Capillary refill < 3 seconds in all extremities. Pulses palpable distally. NEUROLOGIC: The patient is alert and oriented x 2 . No focal neurological deficit appreciated. Cranial nerves are intact.  SKIN: No obvious rash, lesion, or ulcer. Warm to touch Labs/imaging that I havepersonally reviewed  (right click and "Reselect all SmartList Selections" daily)     Labs   CBC: Recent Labs  Lab 09/12/23 0414  WBC 21.0*  NEUTROABS 18.0*  HGB 14.6  HCT 47.9*  MCV 96.6  PLT 423*    Basic Metabolic Panel: Recent Labs  Lab 09/12/23 0414  NA 122*  K 6.5*  CL 87*  CO2 <7*  GLUCOSE 1,109*  BUN 53*  CREATININE 2.69*  CALCIUM 9.7   GFR: Estimated Creatinine Clearance: 21.6 mL/min (A) (by C-G formula based on SCr of 2.69 mg/dL (H)). Recent Labs  Lab 09/12/23  0414  WBC 21.0*    Liver Function Tests: Recent Labs  Lab 09/12/23 0414  AST 14*  ALT 20  ALKPHOS 150*  BILITOT 2.1*  PROT 9.5*  ALBUMIN 4.8   Recent Labs  Lab 09/12/23 0414  LIPASE 200*   No results for input(s): "AMMONIA" in the last 168 hours.  ABG     Component Value Date/Time   PHART 7.34 (L) 10/08/2020 0805   PCO2ART 40 10/08/2020 0805   PO2ART 78 (L) 10/08/2020 0805   HCO3 4.5 (L) 09/12/2023 0355   TCO2 15 01/12/2016 2315   ACIDBASEDEF 25.7 (H) 09/12/2023 0355   O2SAT 75.5 09/12/2023 0355     Coagulation Profile: No results for input(s): "INR", "PROTIME" in the last 168 hours.  Cardiac Enzymes: No results for input(s): "CKTOTAL", "CKMB", "CKMBINDEX", "TROPONINI" in the last 168 hours.  HbA1C: Hgb A1c MFr Bld  Date/Time Value Ref Range Status  04/11/2023 03:28 AM >15.5 (H) 4.8 - 5.6 % Final    Comment:    (NOTE)         Prediabetes: 5.7 - 6.4         Diabetes: >6.4         Glycemic control for adults with diabetes: <7.0   03/14/2022 01:35 AM 13.0 (H) 4.8 - 5.6 % Final    Comment:    (NOTE) Pre diabetes:          5.7%-6.4%  Diabetes:              >6.4%  Glycemic control for   <7.0% adults with diabetes     CBG: No results for input(s): "GLUCAP" in the last 168 hours.  Review of Systems:   Unable to be obtained secondary to the patient not a good historian   Past Medical History  She,  has a past medical history of Asthma, Clostridium difficile colitis, Diabetes mellitus without complication (HCC), Hypertension, and Mental disorder.   Surgical History    Past Surgical History:  Procedure Laterality Date   ABDOMINAL HYSTERECTOMY       Social History   reports that she has been smoking cigarettes. She has never used smokeless tobacco. She reports that she does not currently use alcohol after a past usage of about 40.0 standard drinks of alcohol per week. She reports current drug use. Drugs: Marijuana and Cocaine.   Family History   Her family history includes Hypertension in her mother.   Allergies No Known Allergies   Home Medications  Prior to Admission medications   Medication Sig Start Date End Date Taking? Authorizing Provider  atorvastatin (LIPITOR) 40 MG tablet TAKE 1 TABLET BY MOUTH AT  BEDTIME. 04/13/23 07/28/23  Charise Killian, MD  buPROPion [facility] SR) 150 MG 12 hr tablet Take 1 tablet (150 mg total) by mouth 2 (two) times daily. 04/13/23 07/28/23  Charise Killian, MD  DILT-XR 120 MG 24 hr capsule Take 1 capsule (120 mg total) by mouth daily. 04/13/23 07/28/23  Charise Killian, MD  gabapentin (NEURONTIN) 300 MG capsule Take 1 capsule (300 mg total) by mouth 2 (two) times daily. 04/13/23 07/28/23  Charise Killian, MD  insulin glargine (LANTUS) 100 UNIT/ML Solostar Pen Inject 25 Units into the skin 2 (two) times daily. Patient taking differently: Inject 50 Units into the skin daily. 04/13/23 07/28/23  Charise Killian, MD  Insulin Pen Needle (NOVOFINE) 30G X 8 MM MISC Enough insulin pen needles for insulin glargine 25 units BID & novolog 15 units TID  x 30 days. No refills 04/13/23   Charise Killian, MD  NOVOLOG FLEXPEN 100 UNIT/ML FlexPen Inject 15 Units into the skin 3 (three) times daily with meals. Patient taking differently: Inject 15 Units into the skin every other day. 04/13/23 07/28/23  Charise Killian, MD  omeprazole (PRILOSEC) 20 MG capsule Take 1 capsule (20 mg total) by mouth daily. 04/13/23 07/28/23  Charise Killian, MD  sitaGLIPtin (JANUVIA) 100 MG tablet Take 1 tablet (100 mg total) by mouth daily. 04/13/23 07/28/23  Charise Killian, MD  vitamin B-12 (CYANOCOBALAMIN) 500 MCG tablet Take 500 mcg by mouth daily.    [provider]  Scheduled Meds:  heparin  5,000 Units Subcutaneous Q8H   Continuous Infusions:  dextrose 5% lactated ringers     insulin     lactated ringers     PRN Meds:.dextrose, docusate sodium, polyethylene glycol  Active Hospital Problem list   See systems below  Assessment & Plan:  #Diabetic Ketoacidosis ?Noncompliance Hx of controlled T2DM: Home medsLantus 25 units bid, Januvia 100 mg daily, Novolog 15 units tid with meals, Xigduo 04-999 mg daily -UA negative, Blood  and urine Cultures for infection  pending -EKG  shows no ischemia -Lipase for pancreatitis -Received Insulin (regular) 0.1u/kg (~10 units) IV x1. Continue Insulin drip, DKA protocol -Keep NPO -Glucose: q1h to titrate insulin -Lab monitoring: q2-4h BMP+Phosphorus+pH (ABG/VBG) continue to assess severity of acidemia -Aggressive  volume repletion  -Diabetes coordinator consult    #Acute Kidney Injury #Severe Anion Gap Metabolic Acidosis with Lactic Acidosis #Hyponatremia- likely pseudohyponatremia in the setting of DKA correct for hyperglycemia #Hyperkalemia corrected with  Insulin -Monitor I&O's / urinary output -Follow BMP -Ensure adequate renal perfusion -Avoid nephrotoxic agents as able -start sodium bicarb gtt if indicated -Replace electrolytes as indicated     #Sepsis secondary to UTI chest x-ray negative and UA positive for UTI -F/u cultures, trend lactic/ PCT -Monitor WBC/ fever curve -start empiric abx -Pressors for MAP goal >65 if needed -Strict I/O's  #Essential hypertension #HLD (hyperlipidemia) -Lipitor -IV hydralazine as needed -DIlt-XR, lisinopril   #Hx SVT (supraventricular tachycardia): HR  --> 140 -Dilt-XR   #Polysubstance abuse, tobacco abuse, cocaine abuse, alcohol abuse and marijuana: -Needs counseling about importance of quitting substance -Nicotine patch -CIWA protocol -UDS   #Bipolar disorder and Anxiety and depression -Continue home medications   #Asthma: Stable -Bronchodilators    Best practice:  Diet:  NPO Pain/Anxiety/Delirium protocol (if indicated): No VAP protocol (if indicated): Not indicated DVT prophylaxis: Subcutaneous Heparin GI prophylaxis: PPI Glucose control:  Insulin gtt Central venous access:  N/A Arterial line:  N/A Lane:  N/A Mobility:  OOB  PT consulted: N/A Last date of multidisciplinary goals of care discussion [3/3] Code Status:  full code Disposition: Stepdown   = Goals of Care = Code Status Order: FULL  Primary Emergency Contact:  Dennis,Latonya    Critical care time: 45 minutes        Webb Silversmith DNP, CCRN, FNP-C, AGACNP-BC Acute Care & Family Nurse Practitioner [facility] PCCM on call pager [phone]

## 2023-09-12 NOTE — ED Notes (Signed)
 Dr. York Cerise informed of rectal temp 95.1. Hold off on warming with bair hugger to prevent hypotension but to continue to monitor.

## 2023-09-12 NOTE — ED Provider Notes (Addendum)
 [facility] Provider Note    Event Date/Time   First MD Initiated Contact with Patient 09/12/23 [phone]     (approximate)   History   Shortness of Breath   HPI Pamela Lane is a 63 y.o. female who presents for evaluation of shortness of breath.  EMS was called out and found that she was short of breath and reporting history of asthma.  They state that her initial SpO2 was about 50% but they do not think that that was an accurate measurement because her hands were very cold at the time.  She is 97% upon arrival in the emergency department after getting a breathing treatment.  She says she feels better but she will not talk or give [place] any additional history.  EMS reports that their fingerstick blood glucose reading was "high", and the patient shook her head no when asked if she has been taking her diabetes medicines.  She would not answer when I ask her why she was not taking them.  She is not reporting any other symptoms or concerns except for the shortness of breath but she is refusing to respond to any of my questions and just keeps her eyes squeezed shut tightly.     Physical Exam   ED Triage Vitals  Encounter Vitals Group     BP 09/12/23 0411 (!) 149/62     Systolic BP Percentile --      Diastolic BP Percentile --      Pulse Rate 09/12/23 0355 (!) 102     Resp 09/12/23 0355 (!) 32     Temp 09/12/23 0448 (!) 95.1 F (35.1 C)     Temp Source 09/12/23 0448 Rectal     SpO2 09/12/23 0411 100 %     Weight --      Height --      Head Circumference --      Peak Flow --      Pain Score --      Pain Loc --      Pain Education --      Exclude from Growth Chart --       Most recent vital signs: Vitals:   09/12/23 0445 09/12/23 0448  BP:    Pulse: 71   Resp: (!) 28   Temp:  (!) 95.1 F (35.1 C)  SpO2: 100%     General: Awake, alert, but refusing to answer questions other than an occasional nodding or shaking of her head. CV:  Good peripheral  perfusion.  Tachycardia, regular rhythm. Resp:  Normal effort after breathing treatment by EMS. no accessory muscle usage nor intercostal retractions.   Abd:  No distention.  No tenderness to palpation of the abdomen.   ED Results / Procedures / Treatments   Labs (all labs ordered are listed, but only abnormal results are displayed) Labs Reviewed  COMPREHENSIVE METABOLIC PANEL - Abnormal; Notable for the following components:      Result Value   Sodium 122 (*)    Potassium 6.5 (*)    Chloride 87 (*)    CO2 <7 (*)    Glucose, Bld 1,109 (*)    BUN 53 (*)    Creatinine, Ser 2.69 (*)    Total Protein 9.5 (*)    AST 14 (*)    Alkaline Phosphatase 150 (*)    Total Bilirubin 2.1 (*)    GFR, Estimated 19 (*)    All other components within normal limits  CBC WITH  DIFFERENTIAL/PLATELET - Abnormal; Notable for the following components:   WBC 21.0 (*)    HCT 47.9 (*)    Platelets 423 (*)    Neutro Abs 18.0 (*)    Lymphs Abs 0.6 (*)    Monocytes Absolute 1.6 (*)    Eosinophils Absolute 0.6 (*)    Abs Immature Granulocytes 0.14 (*)    All other components within normal limits  BRAIN NATRIURETIC PEPTIDE - Abnormal; Notable for the following components:   B Natriuretic Peptide 183.0 (*)    All other components within normal limits  BLOOD GAS, VENOUS - Abnormal; Notable for the following components:   pH, Ven 6.98 (*)    pCO2, Ven 19 (*)    pO2, Ven 55 (*)    Bicarbonate 4.5 (*)    Acid-base deficit 25.7 (*)    All other components within normal limits  LIPASE, BLOOD - Abnormal; Notable for the following components:   Lipase 200 (*)    All other components within normal limits  BETA-HYDROXYBUTYRIC ACID - Abnormal; Notable for the following components:   Beta-Hydroxybutyric Acid >8.00 (*)    All other components within normal limits  URINALYSIS, ROUTINE W REFLEX MICROSCOPIC - Abnormal; Notable for the following components:   Color, Urine YELLOW (*)    APPearance CLOUDY (*)     Glucose, UA >=500 (*)    Hgb urine dipstick MODERATE (*)    Ketones, ur 20 (*)    Protein, ur 30 (*)    Leukocytes,Ua LARGE (*)    Bacteria, UA RARE (*)    All other components within normal limits  RESP PANEL BY RT-PCR (RSV, FLU A&B, COVID)  [id number]  OSMOLALITY  URINE DRUG SCREEN, QUALITATIVE ([facility] ONLY)  ETHANOL  TROPONIN I (HIGH SENSITIVITY)  TROPONIN I (HIGH SENSITIVITY)     EKG  ED ECG REPORT I, Loleta Rose, the attending physician, personally viewed and interpreted this ECG.  Date: 09/12/2023 EKG Time: 4:29 AM Rate: 100 Rhythm: Sinus tachycardia with supraventricular bigeminy QRS Axis: normal Intervals: Prolonged QTc of 511 ms ST/T Wave abnormalities: Non-specific ST segment / T-wave changes, but no clear evidence of acute ischemia. Narrative Interpretation: no definitive evidence of acute ischemia; does not meet STEMI criteria.    RADIOLOGY See hospital course for details regarding chest x-ray   PROCEDURES:  Critical Care performed: Yes, see critical care procedure note(s)  .1-3 Lead EKG Interpretation  Performed by: Loleta Rose, MD Authorized by: Loleta Rose, MD     Interpretation: abnormal     ECG rate:  105   ECG rate assessment: tachycardic     Rhythm: sinus tachycardia     Ectopy: none     Conduction: normal   .Critical Care  Performed by: Loleta Rose, MD Authorized by: Loleta Rose, MD   Critical care provider statement:    Critical care time (minutes):  45   Critical care time was exclusive of:  Separately billable procedures and treating other patients   Critical care was necessary to treat or prevent imminent or life-threatening deterioration of the following conditions:  Metabolic crisis   Critical care was time spent personally by me on the following activities:  Development of treatment plan with patient or surrogate, evaluation of patient's response to treatment, examination of patient, obtaining history from patient or surrogate,  ordering and performing treatments and interventions, ordering and review of laboratory studies, ordering and review of radiographic studies, pulse oximetry, re-evaluation of patient's condition and review of old charts  IMPRESSION / MDM / ASSESSMENT AND PLAN / ED COURSE  I reviewed the triage vital signs and the nursing notes.                              Differential diagnosis includes, but is not limited to, asthma exacerbation, COPD exacerbation, DKA, HHS, hyperglycemia, renal dysfunction, electrolyte or metabolic abnormality, less likely pneumonia or PE.  Patient's presentation is most consistent with acute presentation with potential threat to life or bodily function.  Labs/studies ordered: Beta hydroxybutyric acid, lipase, BNP, CBC with differential, respiratory viral panel, VBG, high-sensitivity troponin, CMP, portable chest x-ray, EKG  Interventions/Medications given:  Medications  insulin regular, human (MYXREDLIN) 100 units/ 100 mL infusion (has no administration in time range)  lactated ringers infusion (has no administration in time range)  dextrose 5 % in lactated ringers infusion (has no administration in time range)  dextrose 50 % solution 0-50 mL (has no administration in time range)  docusate sodium (COLACE) capsule 100 mg (has no administration in time range)  polyethylene glycol (MIRALAX / GLYCOLAX) packet 17 g (has no administration in time range)  heparin injection 5,000 Units (has no administration in time range)  lactated ringers bolus 1,000 mL (1,000 mLs Intravenous New Bag/Given 09/12/23 0456)  lactated ringers bolus 1,000 mL (1,000 mLs Intravenous New Bag/Given 09/12/23 0528)    (Note:  hospital course my include additional interventions and/or labs/studies not listed above.)   Initial call was for dyspnea but the patient says she is feeling better after breathing treatment and she is no longer hypoxic.  I doubt that she was actually satting 50% when EMS  first arrived.  Lungs sound clear at this time.  More concerning is her on measurably high glucose.  I am evaluating for the possibility of DKA.  I will start with LR 1 L IV bolus and will hold off on any insulin until verifying the electrolytes  The patient is on the cardiac monitor to evaluate for evidence of arrhythmia and/or significant heart rate changes.   Clinical Course as of 09/12/23 0548  Mon Sep 12, 2023  0453 Blood gas, venous(!!) Substantial acidosis with a venous pH of 6.98.  pCO2 on the VBG is 19.  Of note, the patient's rectal temperature is 95.1 degrees, but I fear that rewarming her too quickly such as with a Bair hugger will cause vasodilation and drop her blood pressure.  If we addressed the primary issue which seems to be DKA I anticipate her temperature will rise more slowly and safely.  Will hold off on Bair hugger at this time.  Still awaiting electrolytes from the metabolic panel to determine appropriate insulin usage [CF]  0455 Bicarbonate has not shown to have a clear beneficial effect even and severely acidotic patients, and because it may result in hypokalemia and I do not yet know the patient's potassium level, I will hold off on administering any sodium bicarb despite the acidosis.  At this point the risk outweighs the benefit. [CF]  0522 DG Chest Portable 1 View I viewed and interpreted the patient's chest x-ray and I see no evidence of pneumonia or pulmonary edema.  I also read the radiologist's report, which confirmed no acute findings. [CF]  [id number] I just got a verbal report from the lab that the patient's potassium is 6.5.  I will begin treatment with IV insulin per DKA protocol.  I will continue to hold off on the sodium  bicarb for now, again, it may be effective in the most ill patients, but the patient is awake and alert and tolerating oral intake, frequently yelling out to nurses for assistance with various issues, etc.  I fear that the issues of potential cerebral  edema and other complications outweighed the potential benefit.  Ordering IV insulin per DKA protocol and I will consult the hospitalist for admission as soon as the labs are back. [CF]  1610 Comprehensive metabolic panel(!!) Patient has a glucose of greater than 1100, potassium 6.5, acute renal failure with creatinine of 2.69, and an incalculable anion gap.  Proceeding with treatment. [CF]  [phone] Consulting the hospitalist for admission [CF]  0533 Lipase(!): 200 Lipase is 200, unclear if this is related to the hyperglycemia or representative of a new issue but she did not have any tenderness to palpation of the abdomen initially and I will hold off on imaging at this time particularly given the acute renal failure [CF]  0534 Consulted the hospitalist for admission, discussed case with Dr. Para March let me know that there is an agreement between PCCM and the hospitalist service that the ICU will admit patients with a pH less than 7.  I am consulting the ICU [CF]  0543 Consulted Webb Silversmith with the hospitalist service who will admit the patient. [CF]  0543 Resp panel by RT-PCR (RSV, Flu A&B, Covid) Anterior Nasal Swab Negative respiratory viral panel [CF]  0543 Beta-Hydroxybutyric Acid(!): >8.00 [CF]    Clinical Course User Index [CF] Loleta Rose, MD     FINAL CLINICAL IMPRESSION(S) / ED DIAGNOSES   Final diagnoses:  Diabetic ketoacidosis without coma associated with type 2 diabetes mellitus (HCC)  Non compliance w medication regimen  Acute renal failure, unspecified acute renal failure type (HCC)  Hyperkalemia  Acute dyspnea  Hypothermia, initial encounter     Rx / DC Orders   ED Discharge Orders     None        Note:  This document was prepared using Dragon voice recognition software and may include unintentional dictation errors.   Loleta Rose, MD 09/12/23 5409    Loleta Rose, MD 09/12/23 [phone]

## 2023-09-12 NOTE — Progress Notes (Signed)
 Patient with glasses, cell phone and charger at bedside. Clothing in patients belonging bag in room.

## 2023-09-12 NOTE — Progress Notes (Signed)
 PHARMACY CONSULT NOTE - FOLLOW UP  Pharmacy Consult for Electrolyte Monitoring and Replacement   Recent Labs: Potassium (mmol/L)  Date Value  09/12/2023 5.2 (H)   Magnesium (mg/dL)  Date Value  21/30/8657 1.9   Calcium (mg/dL)  Date Value  84/69/6295 9.0   Albumin (g/dL)  Date Value  28/41/3244 4.8   Phosphorus (mg/dL)  Date Value  07/14/7251 3.3   Sodium (mmol/L)  Date Value  09/12/2023 128 (L)   Assessment: MS is a 63 yo female who presented to the ED after having shortness of breath other the last 3 days. They were found with a elevated BG > 600 and hyperkalemia. They has a past medical history significant for uncontrolled diabetes with recurrent DKA. Pharmacy was consulted to manage this patient's electrolytes while they're in the ICU.   Fluids: D5WLR @ 125 mL/min, LR @ 125 mL/min Insulin: Insulin drip at 10 units/hr Medications: NA  Goal of Therapy:  Electrolytes WNL  Plan:  Monitoring K every 4 hours while on insulin drip and resolving DKA  No interventions required at this time, continue to monitor for resolution of hyperkalemia with insulin gtt and fluids with the down-trending blood glucose  Continue to monitor BMP Q4H   Effie Shy, PharmD Pharmacy Resident  09/12/2023 12:56 PM

## 2023-09-12 NOTE — Plan of Care (Signed)
  Problem: Education: Goal: Knowledge of General Education information will improve Description: Including pain rating scale, medication(s)/side effects and non-pharmacologic comfort measures Outcome: Progressing   Problem: Health Behavior/Discharge Planning: Goal: Ability to manage health-related needs will improve Outcome: Progressing   Problem: Clinical Measurements: Goal: Ability to maintain clinical measurements within normal limits will improve Outcome: Progressing Goal: Will remain free from infection Outcome: Progressing Goal: Diagnostic test results will improve Outcome: Progressing Goal: Respiratory complications will improve Outcome: Progressing Goal: Cardiovascular complication will be avoided Outcome: Progressing   Problem: Activity: Goal: Risk for activity intolerance will decrease Outcome: Progressing   Problem: Nutrition: Goal: Adequate nutrition will be maintained Outcome: Progressing   Problem: Pain Managment: Goal: General experience of comfort will improve and/or be controlled Outcome: Progressing   Problem: Skin Integrity: Goal: Risk for impaired skin integrity will decrease Outcome: Progressing   Problem: Fluid Volume: Goal: Ability to achieve a balanced intake and output will improve Outcome: Progressing   Problem: Health Behavior/Discharge Planning: Goal: Ability to identify and utilize available resources and services will improve Outcome: Not Progressing Goal: Ability to manage health-related needs will improve Outcome: Progressing

## 2023-09-12 NOTE — Inpatient Diabetes Management (Signed)
 Inpatient Diabetes Program Recommendations  AACE/ADA: New Consensus Statement on Inpatient Glycemic Control   Target Ranges:  Prepandial:   less than 140 mg/dL      Peak postprandial:   less than 180 mg/dL (1-2 hours)      Critically ill patients:  140 - 180 mg/dL    Latest Reference Range & Units 09/12/23 04:03 09/12/23 06:00 09/12/23 07:01 09/12/23 07:33 09/12/23 08:43  Glucose-Capillary 70 - 99 mg/dL >478 (HH) >295 (HH) >621 (HH) >600 (HH) >600 (HH)    Latest Reference Range & Units 09/12/23 04:13  Cocaine Metabolite,Ur Willis NONE DETECTED  POSITIVE !    Latest Reference Range & Units 09/12/23 04:14  CO2 22 - 32 mmol/L <7 (L)  Glucose 70 - 99 mg/dL 3,086 (HH)  BUN 8 - 23 mg/dL 53 (H)  Creatinine 5.78 - 1.00 mg/dL 4.69 (H)  Calcium 8.9 - 10.3 mg/dL 9.7  Anion gap 5 - 15  NOT CALCULATED   Review of Glycemic Control  Diabetes history: DM2 Outpatient Diabetes medications: Lantus 50 units daily, Novolog 15 units every other day, Januvia 100 mg daily Current orders for Inpatient glycemic control: IV insulin  Inpatient Diabetes Program Recommendations:    Insulin: IV insulin should be continued until acidosis has completely resolved. Once acidosis resolved and provider is ready to transition from IV to SQ insulin, please consider ordering Semglee 45 units Q24H, CBGs Q4H, Novolog 0-15 units Q4H, and Novolog 7 units TID with meals for meal coverage if patient eats at least 50% of meals.  NOTE: Patient admitted with DKA, AKI, sepsis, and UTI. Patient noted to be positive for cocaine on 09/12/23. Patient well known to inpatient DM team due to multiple ED visits and hospital admissions. Patient was last inpatient 07/28/23-07/30/23 and seen by inpatient diabetes coordinator on 07/29/23.   Thanks, Orlando Penner, RN, MSN, [facility] Diabetes Coordinator Inpatient Diabetes Program [phone] (Team Pager from 8am to 5pm)

## 2023-09-12 NOTE — ED Triage Notes (Signed)
 Pt coming from home after having SOB x3 days that got worst. Per ems pt bgl read "Hi". Pt was giving douneb sat was 97% while completing douneb. Per ems sats was 50's but not a good reading.

## 2023-09-12 NOTE — ED Notes (Signed)
 Dr. York Cerise was informed of critical glucose and critical K+

## 2023-09-13 ENCOUNTER — Inpatient Hospital Stay
Admit: 2023-09-13 | Discharge: 2023-09-13 | Disposition: A | Discharge status: Home / Self Care | Payer: MEDICAID | Attending: Pulmonary Disease

## 2023-09-13 DIAGNOSIS — K859 Acute pancreatitis without necrosis or infection, unspecified: Secondary | ICD-10-CM | POA: Diagnosis not present

## 2023-09-13 DIAGNOSIS — E1110 Type 2 diabetes mellitus with ketoacidosis without coma: Secondary | ICD-10-CM | POA: Diagnosis not present

## 2023-09-13 DIAGNOSIS — N179 Acute kidney failure, unspecified: Secondary | ICD-10-CM | POA: Diagnosis not present

## 2023-09-13 DIAGNOSIS — E0810 Diabetes mellitus due to underlying condition with ketoacidosis without coma: Secondary | ICD-10-CM

## 2023-09-13 DIAGNOSIS — N390 Urinary tract infection, site not specified: Secondary | ICD-10-CM

## 2023-09-13 DIAGNOSIS — G9341 Metabolic encephalopathy: Secondary | ICD-10-CM

## 2023-09-13 LAB — GLUCOSE, CAPILLARY
Glucose-Capillary: 117 mg/dL — ABNORMAL HIGH (ref 70–99)
Glucose-Capillary: 131 mg/dL — ABNORMAL HIGH (ref 70–99)
Glucose-Capillary: 148 mg/dL — ABNORMAL HIGH (ref 70–99)
Glucose-Capillary: 150 mg/dL — ABNORMAL HIGH (ref 70–99)
Glucose-Capillary: 154 mg/dL — ABNORMAL HIGH (ref 70–99)
Glucose-Capillary: 158 mg/dL — ABNORMAL HIGH (ref 70–99)
Glucose-Capillary: 168 mg/dL — ABNORMAL HIGH (ref 70–99)
Glucose-Capillary: 174 mg/dL — ABNORMAL HIGH (ref 70–99)
Glucose-Capillary: 180 mg/dL — ABNORMAL HIGH (ref 70–99)
Glucose-Capillary: 181 mg/dL — ABNORMAL HIGH (ref 70–99)
Glucose-Capillary: 182 mg/dL — ABNORMAL HIGH (ref 70–99)
Glucose-Capillary: 203 mg/dL — ABNORMAL HIGH (ref 70–99)
Glucose-Capillary: 210 mg/dL — ABNORMAL HIGH (ref 70–99)
Glucose-Capillary: 210 mg/dL — ABNORMAL HIGH (ref 70–99)
Glucose-Capillary: 213 mg/dL — ABNORMAL HIGH (ref 70–99)
Glucose-Capillary: 222 mg/dL — ABNORMAL HIGH (ref 70–99)
Glucose-Capillary: 238 mg/dL — ABNORMAL HIGH (ref 70–99)
Glucose-Capillary: 256 mg/dL — ABNORMAL HIGH (ref 70–99)
Glucose-Capillary: 274 mg/dL — ABNORMAL HIGH (ref 70–99)

## 2023-09-13 LAB — BASIC METABOLIC PANEL
Anion gap: 11 (ref 5–15)
Anion gap: 11 (ref 5–15)
Anion gap: 14 (ref 5–15)
Anion gap: 8 (ref 5–15)
BUN: 28 mg/dL — ABNORMAL HIGH (ref 8–23)
BUN: 29 mg/dL — ABNORMAL HIGH (ref 8–23)
BUN: 33 mg/dL — ABNORMAL HIGH (ref 8–23)
BUN: 35 mg/dL — ABNORMAL HIGH (ref 8–23)
CO2: 17 mmol/L — ABNORMAL LOW (ref 22–32)
CO2: 18 mmol/L — ABNORMAL LOW (ref 22–32)
CO2: 19 mmol/L — ABNORMAL LOW (ref 22–32)
CO2: 20 mmol/L — ABNORMAL LOW (ref 22–32)
Calcium: 8.5 mg/dL — ABNORMAL LOW (ref 8.9–10.3)
Calcium: 8.8 mg/dL — ABNORMAL LOW (ref 8.9–10.3)
Calcium: 8.9 mg/dL (ref 8.9–10.3)
Calcium: 8.9 mg/dL (ref 8.9–10.3)
Chloride: 104 mmol/L (ref 98–111)
Chloride: 104 mmol/L (ref 98–111)
Chloride: 107 mmol/L (ref 98–111)
Chloride: 107 mmol/L (ref 98–111)
Creatinine, Ser: 0.98 mg/dL (ref 0.44–1.00)
Creatinine, Ser: 1.1 mg/dL — ABNORMAL HIGH (ref 0.44–1.00)
Creatinine, Ser: 1.11 mg/dL — ABNORMAL HIGH (ref 0.44–1.00)
Creatinine, Ser: 1.17 mg/dL — ABNORMAL HIGH (ref 0.44–1.00)
GFR, Estimated: 53 mL/min — ABNORMAL LOW (ref >60)
GFR, Estimated: 56 mL/min — ABNORMAL LOW (ref >60)
GFR, Estimated: 57 mL/min — ABNORMAL LOW (ref >60)
GFR, Estimated: >60 mL/min (ref >60)
Glucose, Bld: 177 mg/dL — ABNORMAL HIGH (ref 70–99)
Glucose, Bld: 184 mg/dL — ABNORMAL HIGH (ref 70–99)
Glucose, Bld: 185 mg/dL — ABNORMAL HIGH (ref 70–99)
Glucose, Bld: 273 mg/dL — ABNORMAL HIGH (ref 70–99)
Potassium: 3.5 mmol/L (ref 3.5–5.1)
Potassium: 3.5 mmol/L (ref 3.5–5.1)
Potassium: 3.5 mmol/L (ref 3.5–5.1)
Potassium: 3.6 mmol/L (ref 3.5–5.1)
Sodium: 134 mmol/L — ABNORMAL LOW (ref 135–145)
Sodium: 135 mmol/L (ref 135–145)
Sodium: 135 mmol/L (ref 135–145)
Sodium: 136 mmol/L (ref 135–145)

## 2023-09-13 LAB — BETA-HYDROXYBUTYRIC ACID
Beta-Hydroxybutyric Acid: 1.06 mmol/L — ABNORMAL HIGH (ref 0.05–0.27)
Beta-Hydroxybutyric Acid: 2.63 mmol/L — ABNORMAL HIGH (ref 0.05–0.27)
Beta-Hydroxybutyric Acid: 0.96 mmol/L — ABNORMAL HIGH (ref 0.05–0.27)
Beta-Hydroxybutyric Acid: 2.38 mmol/L — ABNORMAL HIGH (ref 0.05–0.27)
Beta-Hydroxybutyric Acid: 0.56 mmol/L — ABNORMAL HIGH (ref 0.05–0.27)

## 2023-09-13 LAB — CBC
HCT: 33 % — ABNORMAL LOW (ref 36.0–46.0)
Hemoglobin: 11.9 g/dL — ABNORMAL LOW (ref 12.0–15.0)
MCH: 30.1 pg (ref 26.0–34.0)
MCHC: 36.1 g/dL — ABNORMAL HIGH (ref 30.0–36.0)
MCV: 83.3 fL (ref 80.0–100.0)
Platelets: 235 10*3/uL (ref 150–400)
RBC: 3.96 MIL/uL (ref 3.87–5.11)
RDW: 12.4 % (ref 11.5–15.5)
WBC: 7.6 10*3/uL (ref 4.0–10.5)
nRBC: 0 % (ref 0.0–0.2)

## 2023-09-13 LAB — HEPATIC FUNCTION PANEL
ALT: 13 U/L (ref 0–44)
AST: 11 U/L — ABNORMAL LOW (ref 15–41)
Albumin: 3.2 g/dL — ABNORMAL LOW (ref 3.5–5.0)
Alkaline Phosphatase: 89 U/L (ref 38–126)
Bilirubin, Direct: <0.1 mg/dL (ref 0.0–0.2)
Total Bilirubin: 0.6 mg/dL (ref 0.0–1.2)
Total Protein: 6.6 g/dL (ref 6.5–8.1)

## 2023-09-13 MED ORDER — ONDANSETRON HCL 4 MG/2ML IJ SOLN
4.0000 mg | Freq: Four times a day (QID) | INTRAMUSCULAR | Status: DC | PRN
  Administered 2023-09-13 – 2023-09-15 (×2): 4 mg via INTRAVENOUS
  Filled 2023-09-13 (×2): qty 2

## 2023-09-13 MED ORDER — POTASSIUM CHLORIDE 10 MEQ/100ML IV SOLN
10.0000 meq | INTRAVENOUS | Status: AC
  Administered 2023-09-13 (×2): 10 meq via INTRAVENOUS
  Filled 2023-09-13 (×2): qty 100

## 2023-09-13 MED ORDER — HYDRALAZINE HCL 20 MG/ML IJ SOLN: 10.0000 mg | Freq: Four times a day (QID) | INTRAMUSCULAR | Status: DC | PRN

## 2023-09-13 MED ORDER — LACTATED RINGERS IV SOLN: INTRAVENOUS | Status: DC

## 2023-09-13 MED ORDER — INSULIN ASPART 100 UNIT/ML IJ SOLN
4.0000 [IU] | Freq: Three times a day (TID) | INTRAMUSCULAR | Status: DC
Start: 2023-09-14 — End: 2023-09-15
  Administered 2023-09-14 (×2): 4 [IU] via SUBCUTANEOUS
  Filled 2023-09-13 (×2): qty 1

## 2023-09-13 MED ORDER — INSULIN ASPART 100 UNIT/ML IJ SOLN
0.0000 [IU] | Freq: Three times a day (TID) | INTRAMUSCULAR | Status: DC
  Administered 2023-09-13: 2 [IU] via SUBCUTANEOUS
  Administered 2023-09-13: 8 [IU] via SUBCUTANEOUS
  Filled 2023-09-13 (×2): qty 1

## 2023-09-13 MED ORDER — DEXTROSE IN LACTATED RINGERS 5 % IV SOLN: INTRAVENOUS | Status: DC

## 2023-09-13 MED ORDER — THIAMINE HCL 100 MG PO TABS
100.0000 mg | ORAL_TABLET | Freq: Every day | ORAL | Status: DC
  Administered 2023-09-14 – 2023-09-16 (×3): 100 mg via ORAL
  Filled 2023-09-13 (×6): qty 1

## 2023-09-13 MED ORDER — INSULIN GLARGINE-YFGN 100 UNIT/ML ~~LOC~~ SOLN
45.0000 [IU] | Freq: Every day | SUBCUTANEOUS | Status: DC
Start: 2023-09-14 — End: 2023-09-15
  Administered 2023-09-14 – 2023-09-15 (×2): 45 [IU] via SUBCUTANEOUS
  Filled 2023-09-13 (×2): qty 0.45

## 2023-09-13 MED ORDER — PANTOPRAZOLE SODIUM 40 MG PO TBEC
40.0000 mg | DELAYED_RELEASE_TABLET | Freq: Every day | ORAL | Status: DC
  Administered 2023-09-13 – 2023-09-16 (×4): 40 mg via ORAL
  Filled 2023-09-13 (×4): qty 1

## 2023-09-13 MED ORDER — INSULIN GLARGINE-YFGN 100 UNIT/ML ~~LOC~~ SOLN
30.0000 [IU] | Freq: Once | SUBCUTANEOUS | Status: AC
  Administered 2023-09-13: 30 [IU] via SUBCUTANEOUS
  Filled 2023-09-13: qty 0.3

## 2023-09-13 MED ORDER — POTASSIUM CHLORIDE 10 MEQ/100ML IV SOLN
10.0000 meq | INTRAVENOUS | Status: AC
  Administered 2023-09-13 (×4): 10 meq via INTRAVENOUS
  Filled 2023-09-13 (×4): qty 100

## 2023-09-13 MED ORDER — VANCOMYCIN HCL 2000 MG/400ML IV SOLN
2000.0000 mg | Freq: Once | INTRAVENOUS | Status: AC
  Administered 2023-09-13: 2000 mg via INTRAVENOUS
  Filled 2023-09-13: qty 400

## 2023-09-13 MED ORDER — INSULIN GLARGINE-YFGN 100 UNIT/ML ~~LOC~~ SOLN
15.0000 [IU] | Freq: Two times a day (BID) | SUBCUTANEOUS | Status: DC
Start: 2023-09-13 — End: 2023-09-13
  Administered 2023-09-13: 15 [IU] via SUBCUTANEOUS
  Filled 2023-09-13: qty 0.15

## 2023-09-13 MED ORDER — INSULIN ASPART 100 UNIT/ML IJ SOLN
5.0000 [IU] | Freq: Three times a day (TID) | INTRAMUSCULAR | Status: DC
Start: 2023-09-13 — End: 2023-09-13
  Administered 2023-09-13: 5 [IU] via SUBCUTANEOUS
  Filled 2023-09-13: qty 1

## 2023-09-13 MED ORDER — INSULIN ASPART 100 UNIT/ML IJ SOLN: 0.0000 [IU] | Freq: Every day | INTRAMUSCULAR | Status: DC

## 2023-09-13 MED ORDER — BUPROPION HCL ER (SR) 150 MG PO TB12
150.0000 mg | ORAL_TABLET | Freq: Two times a day (BID) | ORAL | Status: DC
  Administered 2023-09-13 – 2023-09-16 (×7): 150 mg via ORAL
  Filled 2023-09-13 (×8): qty 1

## 2023-09-13 MED ORDER — INSULIN GLARGINE 100 UNIT/ML ~~LOC~~ SOLN
15.0000 [IU] | Freq: Once | SUBCUTANEOUS | Status: AC
  Administered 2023-09-13: 15 [IU] via SUBCUTANEOUS
  Filled 2023-09-13 (×2): qty 0.15

## 2023-09-13 MED ORDER — ADULT MULTIVITAMIN W/MINERALS CH
1.0000 | ORAL_TABLET | Freq: Every day | ORAL | Status: DC
  Administered 2023-09-14 – 2023-09-16 (×3): 1 via ORAL
  Filled 2023-09-13 (×3): qty 1

## 2023-09-13 MED ORDER — INSULIN ASPART 100 UNIT/ML IJ SOLN
0.0000 [IU] | INTRAMUSCULAR | Status: DC
  Administered 2023-09-13: 3 [IU] via SUBCUTANEOUS
  Administered 2023-09-14: 11 [IU] via SUBCUTANEOUS
  Administered 2023-09-14: 7 [IU] via SUBCUTANEOUS
  Administered 2023-09-14: 11 [IU] via SUBCUTANEOUS
  Administered 2023-09-15: 20 [IU] via SUBCUTANEOUS
  Administered 2023-09-15: 7 [IU] via SUBCUTANEOUS
  Administered 2023-09-15: 4 [IU] via SUBCUTANEOUS
  Administered 2023-09-15: 11 [IU] via SUBCUTANEOUS
  Administered 2023-09-15: 7 [IU] via SUBCUTANEOUS
  Administered 2023-09-16: 3 [IU] via SUBCUTANEOUS
  Administered 2023-09-16: 4 [IU] via SUBCUTANEOUS
  Administered 2023-09-16: 11 [IU] via SUBCUTANEOUS
  Filled 2023-09-13 (×12): qty 1

## 2023-09-13 MED ORDER — INSULIN GLARGINE-YFGN 100 UNIT/ML ~~LOC~~ SOLN: 45.0000 [IU] | Freq: Every day | SUBCUTANEOUS | Status: DC

## 2023-09-13 MED ORDER — DILTIAZEM HCL ER COATED BEADS 120 MG PO CP24
120.0000 mg | ORAL_CAPSULE | Freq: Every day | ORAL | Status: DC
  Administered 2023-09-13 – 2023-09-16 (×4): 120 mg via ORAL
  Filled 2023-09-13 (×5): qty 1

## 2023-09-13 MED ORDER — INSULIN ASPART 100 UNIT/ML IJ SOLN
3.0000 [IU] | Freq: Three times a day (TID) | INTRAMUSCULAR | Status: DC
  Administered 2023-09-13: 3 [IU] via SUBCUTANEOUS
  Filled 2023-09-13: qty 1

## 2023-09-13 MED ORDER — GABAPENTIN 300 MG PO CAPS
300.0000 mg | ORAL_CAPSULE | Freq: Two times a day (BID) | ORAL | Status: DC
  Administered 2023-09-13 – 2023-09-16 (×7): 300 mg via ORAL
  Filled 2023-09-13 (×7): qty 1

## 2023-09-13 MED ORDER — POTASSIUM CHLORIDE CRYS ER 20 MEQ PO TBCR
40.0000 meq | EXTENDED_RELEASE_TABLET | ORAL | Status: AC
Start: 2023-09-13 — End: 2023-09-13
  Administered 2023-09-13: 40 meq via ORAL
  Filled 2023-09-13 (×2): qty 2

## 2023-09-13 MED ORDER — ENSURE MAX PROTEIN PO LIQD
11.0000 [oz_av] | Freq: Two times a day (BID) | ORAL | Status: DC
  Administered 2023-09-13 – 2023-09-15 (×4): 11 [oz_av] via ORAL
  Filled 2023-09-13: qty 330

## 2023-09-13 MED ORDER — VANCOMYCIN HCL IN DEXTROSE 1-5 GM/200ML-% IV SOLN
1000.0000 mg | INTRAVENOUS | Status: DC
  Administered 2023-09-14: 1000 mg via INTRAVENOUS
  Filled 2023-09-13: qty 200

## 2023-09-13 MED ORDER — INSULIN REGULAR(HUMAN) IN NACL 100-0.9 UT/100ML-% IV SOLN: INTRAVENOUS | Status: DC

## 2023-09-13 MED ORDER — SODIUM CHLORIDE 0.9% FLUSH: 10.0000 mL | INTRAVENOUS | Status: DC | PRN

## 2023-09-13 MED ORDER — INSULIN ASPART 100 UNIT/ML IJ SOLN: 0.0000 [IU] | Freq: Three times a day (TID) | INTRAMUSCULAR | Status: DC

## 2023-09-13 MED ORDER — DEXTROSE 50 % IV SOLN: 0.0000 mL | INTRAVENOUS | Status: DC | PRN

## 2023-09-13 NOTE — Consult Note (Signed)
 [facility]    Date of Admission:  09/12/2023   Total days of inpatient antibiotics 2        Reason for Consult: GPC + blood Cx    Principal Problem:   DKA (diabetic ketoacidosis) (HCC) Active Problems:   Acute renal failure [facility])   Assessment: 63 year old female with history of uncontrolled diabetes, recurrent DKA, hypertension, CKD stage III, C. difficile colitis, polysubstance abuse admitted with DKA found to have: #Blood cultures 2/2/GPC #DKA #Leukocytosis-resolved - BC ID from admission shows staph epi .  Both sets with GPC.  Unclear if contamination.  Patient has no bruises/rashes.  No signs of phlebitis reported. - CT abdomen pelvis showed mild stranding around pancreas, patchy peripheral groundglass lung opacity.  Patient today denies any respiratory symptoms, reported as abdominal pain though. - Okay to continue vancomycin as speciation pending.  I think her leukocytosis could be in the setting of DKA.  She remains afebrile.  UA with large leukocytes, negative nitrites, did not reflex to cultures.  Patient denies any respiratory symptoms can stop ceftriaxone metronidazole. Recommendations:  -Follow repeat blood cultures to ensure clearance - DC ceftriaxone metronidazole - Continue vancomycin - TTE - Will follow speciation of GPC in second set. Microbiology:   Antibiotics:  Comycin, ceftriaxone, metronidazole Cultures: Blood 3/32/2 sets GPC with BC ID showing Staph epidermidis Urine  Other   HPI: Pamela Lane is a 63 y.o. female with past medical history of uncontrolled diabetes, recurrent DKA, hypertension hyperlipidemia, bipolar disorder, CKD stage III, C. difficile colitis polysubstance abuse presented to ED with shortness of breath and hypoxia.  On arrival to the ED patient had white cell count 21K.  Glucose 1109 admitted for DKA.  ID engaged as blood cultures 2/2/grew GPC.   Review of Systems: Review of Systems  All  other systems reviewed and are negative.   Past Medical History:  Diagnosis Date   Asthma    Clostridium difficile colitis    Diabetes mellitus without complication (HCC)    Hypertension    Mental disorder    pt reports 'I have all of them mental disorders'    Social History   Tobacco Use   Smoking status: Every Day    Current packs/day: 1.00    Types: Cigarettes   Smokeless tobacco: Never  Vaping Use   Vaping status: Never Used  Substance Use Topics   Alcohol use: Not Currently    Alcohol/week: 40.0 standard drinks of alcohol    Types: 40 Cans of beer per week    Comment: 3 big bottles of liquor   Drug use: Yes    Types: Marijuana, Cocaine    Comment: pt sts she did carck 09/10/23    Family History  Problem Relation Age of Onset   Hypertension Mother    Scheduled Meds:  buPROPion ER  150 mg Oral BID   Chlorhexidine Gluconate Cloth  6 each Topical Daily   diltiazem  120 mg Oral Daily   gabapentin  300 mg Oral BID   heparin  5,000 Units Subcutaneous Q8H   [START ON 09/14/2023] multivitamin with minerals  1 tablet Oral Daily   pantoprazole  40 mg Oral Daily   Ensure Max Protein  11 oz Oral BID   [START ON 09/14/2023] thiamine  100 mg Oral Daily   Continuous Infusions:  dextrose 5% lactated ringers     insulin     lactated ringers 100 mL/hr at 09/13/23  1034   lactated ringers     potassium chloride     [START ON 09/14/2023] vancomycin     PRN Meds:.dextrose, docusate sodium, hydrALAZINE, ondansetron (ZOFRAN) IV, mouth rinse, polyethylene glycol No Known Allergies  OBJECTIVE: Blood pressure (!) 144/75, pulse (!) 107, temperature 97.8 F (36.6 C), temperature source Oral, resp. rate 16, height 5\' 3"  (1.6 m), weight 74.8 kg, SpO2 100%.  Physical Exam Constitutional:      Appearance: Normal appearance.  HENT:     Head: Normocephalic and atraumatic.     Right Ear: Tympanic membrane normal.     Left Ear: Tympanic membrane normal.     Nose: Nose normal.      Mouth/Throat:     Mouth: Mucous membranes are moist.  Eyes:     Extraocular Movements: Extraocular movements intact.     Conjunctiva/sclera: Conjunctivae normal.     Pupils: Pupils are equal, round, and reactive to light.  Cardiovascular:     Rate and Rhythm: Normal rate and regular rhythm.     Heart sounds: No murmur heard.    No friction rub. No gallop.  Pulmonary:     Effort: Pulmonary effort is normal.     Breath sounds: Normal breath sounds.  Abdominal:     General: Abdomen is flat.     Palpations: Abdomen is soft.  Musculoskeletal:        General: Normal range of motion.  Skin:    General: Skin is warm and dry.  Neurological:     General: No focal deficit present.     Mental Status: She is alert and oriented to person, place, and time.  Psychiatric:        Mood and Affect: Mood normal.     Lab Results Lab Results  Component Value Date   WBC 7.6 09/13/2023   HGB 11.9 (L) 09/13/2023   HCT 33.0 (L) 09/13/2023   MCV 83.3 09/13/2023   PLT 235 09/13/2023    Lab Results  Component Value Date   CREATININE 1.10 (H) 09/13/2023   BUN 29 (H) 09/13/2023   NA 135 09/13/2023   K 3.6 09/13/2023   CL 104 09/13/2023   CO2 17 (L) 09/13/2023    Lab Results  Component Value Date   ALT 13 09/13/2023   AST 11 (L) 09/13/2023   ALKPHOS 89 09/13/2023   BILITOT 0.6 09/13/2023       Danelle Earthly, MD [facility] 09/13/2023, 1:53 PM

## 2023-09-13 NOTE — Progress Notes (Signed)
 PHARMACY - PHYSICIAN COMMUNICATION CRITICAL VALUE ALERT - BLOOD CULTURE IDENTIFICATION (BCID)  Results for orders placed or performed during the hospital encounter of 09/12/23  Resp panel by RT-PCR (RSV, Flu A&B, Covid) Anterior Nasal Swab     Status: None   Collection Time: 09/12/23  4:13 AM   Specimen: Anterior Nasal Swab  Result Value Ref Range Status   SARS Coronavirus 2 by RT PCR NEGATIVE NEGATIVE Final    Comment: (NOTE) SARS-CoV-2 target nucleic acids are NOT DETECTED.  The SARS-CoV-2 RNA is generally detectable in upper respiratory specimens during the acute phase of infection. The lowest concentration of SARS-CoV-2 viral copies this assay can detect is 138 copies/mL. A negative result does not preclude SARS-Cov-2 infection and should not be used as the sole basis for treatment or other patient management decisions. A negative result may occur with  improper specimen collection/handling, submission of specimen other than nasopharyngeal swab, presence of viral mutation(s) within the areas targeted by this assay, and inadequate number of viral copies(<138 copies/mL). A negative result must be combined with clinical observations, patient history, and epidemiological information. The expected result is Negative.  Fact Sheet for Patients:  BloggerCourse.com  Fact Sheet for Healthcare Providers:  SeriousBroker.it  This test is no t yet approved or cleared by the [place] FDA and  has been authorized for detection and/or diagnosis of SARS-CoV-2 by FDA under an Emergency Use Authorization (EUA). This EUA will remain  in effect (meaning this test can be used) for the duration of the COVID-19 declaration under Section 564(b)(1) of the Act, 21 U.S.C.section 360bbb-3(b)(1), unless the authorization is terminated  or revoked sooner.       Influenza A by PCR NEGATIVE NEGATIVE Final   Influenza B by PCR NEGATIVE NEGATIVE Final     Comment: (NOTE) The Xpert Xpress SARS-CoV-2/FLU/RSV plus assay is intended as an aid in the diagnosis of influenza from Nasopharyngeal swab specimens and should not be used as a sole basis for treatment. Nasal washings and aspirates are unacceptable for Xpert Xpress SARS-CoV-2/FLU/RSV testing.  Fact Sheet for Patients: BloggerCourse.com  Fact Sheet for Healthcare Providers: SeriousBroker.it  This test is not yet approved or cleared by the [place] FDA and has been authorized for detection and/or diagnosis of SARS-CoV-2 by FDA under an Emergency Use Authorization (EUA). This EUA will remain in effect (meaning this test can be used) for the duration of the COVID-19 declaration under Section 564(b)(1) of the Act, 21 U.S.C. section 360bbb-3(b)(1), unless the authorization is terminated or revoked.     Resp Syncytial Virus by PCR NEGATIVE NEGATIVE Final    Comment: (NOTE) Fact Sheet for Patients: BloggerCourse.com  Fact Sheet for Healthcare Providers: SeriousBroker.it  This test is not yet approved or cleared by the [place] FDA and has been authorized for detection and/or diagnosis of SARS-CoV-2 by FDA under an Emergency Use Authorization (EUA). This EUA will remain in effect (meaning this test can be used) for the duration of the COVID-19 declaration under Section 564(b)(1) of the Act, 21 U.S.C. section 360bbb-3(b)(1), unless the authorization is terminated or revoked.  Performed at [facility], [address] [place], [place] [address]   Culture, blood (Routine X 2) w Reflex to ID Panel     Status: None (Preliminary result)   Collection Time: 09/12/23  6:11 AM   Specimen: BLOOD RIGHT ARM  Result Value Ref Range Status   Specimen Description BLOOD RIGHT ARM  Final   Special Requests   Final  BOTTLES DRAWN AEROBIC AND ANAEROBIC Blood Culture results  may not be optimal due to an inadequate volume of blood received in culture bottles   Culture  Setup Time   Final    Organism ID to follow GRAM POSITIVE COCCI AEROBIC BOTTLE ONLY CRITICAL RESULT CALLED TO, READ BACK BY AND VERIFIED WITHDawayne Cirri AT 2300 09/12/23 JG Performed at [facility], [address] [place], [place] [address]    Culture PENDING  Incomplete   Report Status PENDING  Incomplete  MRSA Next Gen by PCR, Nasal     Status: None   Collection Time: 09/12/23  6:11 AM   Specimen: Nasal Mucosa; Nasal Swab  Result Value Ref Range Status   MRSA by PCR Next Gen NOT DETECTED NOT DETECTED Final    Comment: (NOTE) The GeneXpert MRSA Assay (FDA approved for NASAL specimens only), is one component of a comprehensive MRSA colonization surveillance program. It is not intended to diagnose MRSA infection nor to guide or monitor treatment for MRSA infections. Test performance is not FDA approved in patients less than 60 years old. Performed at [facility], [address] [place], [place] [address]   Blood Culture ID Panel (Reflexed)     Status: Abnormal   Collection Time: 09/12/23  6:11 AM  Result Value Ref Range Status   Enterococcus faecalis NOT DETECTED NOT DETECTED Final   Enterococcus Faecium NOT DETECTED NOT DETECTED Final   Listeria monocytogenes NOT DETECTED NOT DETECTED Final   Staphylococcus species DETECTED (A) NOT DETECTED Final    Comment: CRITICAL RESULT CALLED TO, READ BACK BY AND VERIFIED WITH:  Maximos Zayas AT 2300 09/12/23 JG    Staphylococcus aureus (BCID) NOT DETECTED NOT DETECTED Final   Staphylococcus epidermidis DETECTED (A) NOT DETECTED Final    Comment: Methicillin (oxacillin) resistant coagulase negative staphylococcus. Possible blood culture contaminant (unless isolated from more than one blood culture draw or clinical case suggests pathogenicity). No antibiotic treatment is indicated for blood  culture contaminants. CRITICAL  RESULT CALLED TO, READ BACK BY AND VERIFIED WITH:  Tatem Fesler AT 2300 09/12/23 JG    Staphylococcus lugdunensis NOT DETECTED NOT DETECTED Final   Streptococcus species NOT DETECTED NOT DETECTED Final   Streptococcus agalactiae NOT DETECTED NOT DETECTED Final   Streptococcus pneumoniae NOT DETECTED NOT DETECTED Final   Streptococcus pyogenes NOT DETECTED NOT DETECTED Final   A.calcoaceticus-baumannii NOT DETECTED NOT DETECTED Final   Bacteroides fragilis NOT DETECTED NOT DETECTED Final   Enterobacterales NOT DETECTED NOT DETECTED Final   Enterobacter cloacae complex NOT DETECTED NOT DETECTED Final   Escherichia coli NOT DETECTED NOT DETECTED Final   Klebsiella aerogenes NOT DETECTED NOT DETECTED Final   Klebsiella oxytoca NOT DETECTED NOT DETECTED Final   Klebsiella pneumoniae NOT DETECTED NOT DETECTED Final   Proteus species NOT DETECTED NOT DETECTED Final   Salmonella species NOT DETECTED NOT DETECTED Final   Serratia marcescens NOT DETECTED NOT DETECTED Final   Haemophilus influenzae NOT DETECTED NOT DETECTED Final   Neisseria meningitidis NOT DETECTED NOT DETECTED Final   Pseudomonas aeruginosa NOT DETECTED NOT DETECTED Final   Stenotrophomonas maltophilia NOT DETECTED NOT DETECTED Final   Candida albicans NOT DETECTED NOT DETECTED Final   Candida auris NOT DETECTED NOT DETECTED Final   Candida glabrata NOT DETECTED NOT DETECTED Final   Candida krusei NOT DETECTED NOT DETECTED Final   Candida parapsilosis NOT DETECTED NOT DETECTED Final   Candida tropicalis NOT DETECTED NOT DETECTED Final   Cryptococcus neoformans/gattii  NOT DETECTED NOT DETECTED Final   Methicillin resistance mecA/C DETECTED (A) NOT DETECTED Final    Comment: CRITICAL RESULT CALLED TO, READ BACK BY AND VERIFIED WITH:  Selina Tapper AT 2300 09/12/23 JG Performed at [facility], [address] [place], [place] [address]   Respiratory (~20 pathogens) panel by PCR     Status: None   Collection Time:  09/12/23  5:06 PM   Specimen: Nasopharyngeal Swab; Respiratory  Result Value Ref Range Status   Adenovirus NOT DETECTED NOT DETECTED Final   Coronavirus 229E NOT DETECTED NOT DETECTED Final    Comment: (NOTE) The Coronavirus on the Respiratory Panel, DOES NOT test for the novel  Coronavirus (2019 nCoV)    Coronavirus HKU1 NOT DETECTED NOT DETECTED Final   Coronavirus NL63 NOT DETECTED NOT DETECTED Final   Coronavirus OC43 NOT DETECTED NOT DETECTED Final   Metapneumovirus NOT DETECTED NOT DETECTED Final   Rhinovirus / Enterovirus NOT DETECTED NOT DETECTED Final   Influenza A NOT DETECTED NOT DETECTED Final   Influenza B NOT DETECTED NOT DETECTED Final   Parainfluenza Virus 1 NOT DETECTED NOT DETECTED Final   Parainfluenza Virus 2 NOT DETECTED NOT DETECTED Final   Parainfluenza Virus 3 NOT DETECTED NOT DETECTED Final   Parainfluenza Virus 4 NOT DETECTED NOT DETECTED Final   Respiratory Syncytial Virus NOT DETECTED NOT DETECTED Final   Bordetella pertussis NOT DETECTED NOT DETECTED Final   Bordetella Parapertussis NOT DETECTED NOT DETECTED Final   Chlamydophila pneumoniae NOT DETECTED NOT DETECTED Final   Mycoplasma pneumoniae NOT DETECTED NOT DETECTED Final    Comment: Performed at [facility], [address], [place], [place] [address]    BCID Results: 1 (aerobic) of 4 bottles w/ Staph Epi, mecA/C detected.  Pt currently on Ceftriaxone for UTI and Flagyl for intra-abdominal infection.  Name of provider contacted: Reyes Ivan, NP   Changes to prescribed antibiotics required: Start Vancomycin in addition to Ceftriaxone & Flagyl.  Otelia Sergeant, PharmD, [facility] 09/13/2023 12:55 AM

## 2023-09-13 NOTE — Progress Notes (Signed)
 Follow up BMP resulted after having to resume insulin drip earlier today following pt going back into mild DKA.  Anion gap has now closed, Bicarb improved to 19 (17) and Beta-hydroxybutyric acid much improved to 0.56 from 2.38.    Discussed with Dr. Aundria Rud, will convert pt off insulin gtt back to long acting and SSI.  Most recent glucose was 210, will give additional 15 units of long acting insulin (received a total of 45 units earlier this morning) to try and prevent pt from going back into DKA, along with resistant Sliding scale and 4 scheduled units with meals. Will resume scheduled Semglee 45 units daily to begin tomorrow morning.   Will give the long acting now, and then in 2 hrs may convert off drip to SSI, and at that time will change IV fluid to LR @ 125 ml given her pancreatitis, and may be allowed to start diet at that time.  This plan was discussed with daytime RN and oncoming night RN.     Harlon Ditty, AGACNP-BC [facility] Prefer epic messenger for cross cover needs If after hours, please call E-link

## 2023-09-13 NOTE — Plan of Care (Signed)
  Problem: Education: Goal: Knowledge of General Education information will improve Description: Including pain rating scale, medication(s)/side effects and non-pharmacologic comfort measures Outcome: Progressing   Problem: Education: Goal: Knowledge of General Education information will improve Description: Including pain rating scale, medication(s)/side effects and non-pharmacologic comfort measures Outcome: Progressing   Problem: Health Behavior/Discharge Planning: Goal: Ability to manage health-related needs will improve Outcome: Progressing   Problem: Health Behavior/Discharge Planning: Goal: Ability to manage health-related needs will improve Outcome: Progressing   Problem: Clinical Measurements: Goal: Ability to maintain clinical measurements within normal limits will improve Outcome: Progressing Goal: Will remain free from infection Outcome: Progressing   Problem: Clinical Measurements: Goal: Ability to maintain clinical measurements within normal limits will improve Outcome: Progressing   Problem: Clinical Measurements: Goal: Will remain free from infection Outcome: Progressing   Problem: Activity: Goal: Risk for activity intolerance will decrease Outcome: Progressing   Problem: Activity: Goal: Risk for activity intolerance will decrease Outcome: Progressing   Problem: Coping: Goal: Level of anxiety will decrease Outcome: Progressing

## 2023-09-13 NOTE — Progress Notes (Signed)
 NAME:  Pamela Lane, MRN:  [id number], DOB:  Oct 30, 1960, LOS: 1 ADMISSION DATE:  09/12/2023, CONSULTATION DATE: 09/12/2023 REFERRING MD: Loleta Rose, CHIEF COMPLAINT: shortness of breath hyperglycemia   Brief Pt Description / Synopsis  63 y.o. female admitted with Acute Metabolic Encephalopathy and severe DKA in the setting of UTI, mild Acute Pancreatitis, cocaine use, and medication noncompliance.   HPI  63 y.o female with significant PMH of uncontrolled diabetes mellitus, recurrent DKA, hypertension, hyperlipidemia, asthma, anxiety and depression, bipolar disorder, CKD stage III, SVT, C. difficile colitis and polysubstance abuse who presented to the ED with chief complaints of breath and hypoxia with a glucose reading of high per EMS.   ED Course: Initial vital signs showed HR of  102 beats/minute, BP 149/62 mm Hg, the RR 32 breaths/minute, and the oxygen saturation 100 % on RA and a temperature of 95.61F (35.1C). Pertinent Labs/Diagnostics Findings: Na+/ K+:122/6.5  Glucose: 1109 BUN/Cr.:53/2.69, CO2 less than 7, anion gap and not calculated WBC: 21 K/L PCT: negative <0.10  Lactic acid: Pending Beta hydroxybutyrate >8.00 COVID PCR: Negative,  VBG: pO2 55; pCO2 19; pH 6.98;  HCO3 4.5, %O2 Sat 75.5.  CXR> Negative  The laboratory data consistent with the diagnosis of DKA. Urinalysis and chest xray demonstrated no evidence of infection. The patient's hemoglobin A1c (A1C) pending. The management was initiated with initial intravenous fluid  followed by intravenous insulin infusion as per DKA protocol. PCCM consulted to admit for further management of DKA.  Past Medical History  uncontrolled diabetes mellitus, recurrent DKA, hypertension, hyperlipidemia, asthma, anxiety and depression, bipolar disorder, CKD stage III, SVT, C. difficile colitis and polysubstance abuse  Significant Hospital Events   3/3: Admitted to ICU with DKA 3/4: DKA resolved, transitioning off insulin gtt.   Additional blood culture with Staph Epi (mecA/C detected).  Vancomycin added, repeat blood cultures, obtain Echocardiogram, and consult ID. Resuming home Cardizem and Wellbutrin.  Consults:  Diabetes coordinator Infectious Disease  Procedures:  None  Significant Diagnostic Tests:  09/12/23: Chest Xray> IMPRESSION: No active disease.  Interim History / Subjective:    As outlined above under "Significant Hospital Events" section  Micro Data:  3/3: SARS-CoV-2 PCR> negative 3/3: Respiratory viral panel>>negative 3/3: Influenza PCR> negative 3/3: Blood culture x2> Staph Epi (? Contaminant) 3/3: MRSA PCR>> negative 3/4: Repeat Blood cultures>>  Antimicrobials:   Anti-infectives (From admission, onward)    Start     Dose/Rate Route Frequency Ordered Stop   09/14/23 0600  vancomycin (VANCOCIN) IVPB 1000 mg/200 mL premix       Placed in "Followed by" Linked Group   1,000 mg 200 mL/hr over 60 Minutes Intravenous Every 24 hours 09/13/23 0320     09/13/23 0430  vancomycin (VANCOREADY) IVPB 2000 mg/400 mL       Placed in "Followed by" Linked Group   2,000 mg 200 mL/hr over 120 Minutes Intravenous  Once 09/13/23 0320 09/13/23 0557   09/12/23 1000  metroNIDAZOLE (FLAGYL) IVPB 500 mg        500 mg 100 mL/hr over 60 Minutes Intravenous Every 12 hours 09/12/23 0754     09/12/23 0645  cefTRIAXone (ROCEPHIN) 1 g in sodium chloride 0.9 % 100 mL IVPB        1 g 200 mL/hr over 30 Minutes Intravenous Every 24 hours 09/12/23 0643         OBJECTIVE  Blood pressure 136/85, pulse (!) 101, temperature 97.8 F (36.6 C), temperature source Oral, resp. rate 16, height 5\' 3"  (  1.6 m), weight 47.1 kg, SpO2 100%.      Intake/Output Summary (Last 24 hours) at 09/13/2023 0737 Last data filed at 09/13/2023 5035 Gross per 24 hour  Intake 3736.89 ml  Output 1150 ml  Net 2586.89 ml   Filed Weights   09/12/23 0530 09/13/23 0458  Weight: 79.4 kg 47.1 kg   Physical Examination  GENERAL: Acute on  chronically ill appearing female, laying in bed, on room air, in NAD EYES: PEERLA. No scleral icterus. Extraocular muscles intact.  HEENT: Head atraumatic, normocephalic. Oropharynx and nasopharynx clear.  NECK:  No JVD, supple  LUNGS: Clear breath sounds bilaterally. Even, nonlabored, normal effort CARDIOVASCULAR:  Tachycardia, regular rhythm, S1, S2 normal. No murmurs, rubs, or gallops.  ABDOMEN: Soft, NTND, no guarding or rebound tenderness, BS+ x4 EXTREMITIES: No swelling or erythema.  Capillary refill < 3 seconds in all extremities. Pulses palpable distally. NEUROLOGIC: The patient is alert and oriented x 3 . No focal neurological deficit appreciated. Cranial nerves are intact.  SKIN: No obvious rash, lesion, or ulcer. Warm to touch  Labs/imaging that I havepersonally reviewed  (right click and "Reselect all SmartList Selections" daily)     Labs   CBC: Recent Labs  Lab 09/12/23 0414 09/13/23 0406  WBC 21.0* 7.6  NEUTROABS 18.0*  --   HGB 14.6 11.9*  HCT 47.9* 33.0*  MCV 96.6 83.3  PLT 423* 235    Basic Metabolic Panel: Recent Labs  Lab 09/12/23 1222 09/12/23 1528 09/12/23 1938 09/13/23 0031 09/13/23 0406  NA 135 133* 135 135 136  K 4.5 4.0 3.9 3.5 3.5  CL 102 102 103 104 107  CO2 15* 18* 17* 20* 18*  GLUCOSE 368* 260* 202* 184* 185*  BUN 45* 42* 38* 35* 33*  CREATININE 2.01* 1.55* 1.30* 1.17* 1.11*  CALCIUM 9.4 9.1 8.9 8.9 8.8*   GFR: Estimated Creatinine Clearance: 39.1 mL/min (A) (by C-G formula based on SCr of 1.11 mg/dL (H)). Recent Labs  Lab 09/12/23 0414 09/12/23 0611 09/12/23 0838 09/13/23 0406  PROCALCITON  --  0.26  --   --   WBC 21.0*  --   --  7.6  LATICACIDVEN  --  3.5* 2.9*  --     Liver Function Tests: Recent Labs  Lab 09/12/23 0414 09/13/23 0406  AST 14* 11*  ALT 20 13  ALKPHOS 150* 89  BILITOT 2.1* 0.6  PROT 9.5* 6.6  ALBUMIN 4.8 3.2*   Recent Labs  Lab 09/12/23 0414  LIPASE 200*   No results for input(s): "AMMONIA" in  the last 168 hours.  ABG    Component Value Date/Time   PHART 7.34 (L) 10/08/2020 0805   PCO2ART 40 10/08/2020 0805   PO2ART 78 (L) 10/08/2020 0805   HCO3 7.1 (L) 09/12/2023 0838   TCO2 15 01/12/2016 2315   ACIDBASEDEF 21.4 (H) 09/12/2023 0838   O2SAT 64.5 09/12/2023 0838     Coagulation Profile: No results for input(s): "INR", "PROTIME" in the last 168 hours.  Cardiac Enzymes: No results for input(s): "CKTOTAL", "CKMB", "CKMBINDEX", "TROPONINI" in the last 168 hours.  HbA1C: Hgb A1c MFr Bld  Date/Time Value Ref Range Status  04/11/2023 03:28 AM >15.5 (H) 4.8 - 5.6 % Final    Comment:    (NOTE)         Prediabetes: 5.7 - 6.4         Diabetes: >6.4         Glycemic control for adults with diabetes: <7.0   03/14/2022 01:35  AM 13.0 (H) 4.8 - 5.6 % Final    Comment:    (NOTE) Pre diabetes:          5.7%-6.4%  Diabetes:              >6.4%  Glycemic control for   <7.0% adults with diabetes     CBG: Recent Labs  Lab 09/13/23 0303 09/13/23 0405 09/13/23 0515 09/13/23 0615 09/13/23 0721  GLUCAP 117* 182* 238* 203* 150*    Review of Systems:   Unable to be obtained secondary to the patient not a good historian   Past Medical History  She,  has a past medical history of Asthma, Clostridium difficile colitis, Diabetes mellitus without complication (HCC), Hypertension, and Mental disorder.   Surgical History    Past Surgical History:  Procedure Laterality Date   ABDOMINAL HYSTERECTOMY       Social History   reports that she has been smoking cigarettes. She has never used smokeless tobacco. She reports that she does not currently use alcohol after a past usage of about 40.0 standard drinks of alcohol per week. She reports current drug use. Drugs: Marijuana and Cocaine.   Family History   Her family history includes Hypertension in her mother.   Allergies No Known Allergies   Home Medications  Prior to Admission medications   Medication Sig Start Date  End Date Taking? Authorizing Provider  atorvastatin (LIPITOR) 40 MG tablet TAKE 1 TABLET BY MOUTH AT BEDTIME. 04/13/23 07/28/23  Charise Killian, MD  buPROPion [facility] SR) 150 MG 12 hr tablet Take 1 tablet (150 mg total) by mouth 2 (two) times daily. 04/13/23 07/28/23  Charise Killian, MD  DILT-XR 120 MG 24 hr capsule Take 1 capsule (120 mg total) by mouth daily. 04/13/23 07/28/23  Charise Killian, MD  gabapentin (NEURONTIN) 300 MG capsule Take 1 capsule (300 mg total) by mouth 2 (two) times daily. 04/13/23 07/28/23  Charise Killian, MD  insulin glargine (LANTUS) 100 UNIT/ML Solostar Pen Inject 25 Units into the skin 2 (two) times daily. Patient taking differently: Inject 50 Units into the skin daily. 04/13/23 07/28/23  Charise Killian, MD  Insulin Pen Needle (NOVOFINE) 30G X 8 MM MISC Enough insulin pen needles for insulin glargine 25 units BID & novolog 15 units TID x 30 days. No refills 04/13/23   Charise Killian, MD  NOVOLOG FLEXPEN 100 UNIT/ML FlexPen Inject 15 Units into the skin 3 (three) times daily with meals. Patient taking differently: Inject 15 Units into the skin every other day. 04/13/23 07/28/23  Charise Killian, MD  omeprazole (PRILOSEC) 20 MG capsule Take 1 capsule (20 mg total) by mouth daily. 04/13/23 07/28/23  Charise Killian, MD  sitaGLIPtin (JANUVIA) 100 MG tablet Take 1 tablet (100 mg total) by mouth daily. 04/13/23 07/28/23  Charise Killian, MD  vitamin B-12 (CYANOCOBALAMIN) 500 MCG tablet Take 500 mcg by mouth daily.    [provider]  Scheduled Meds:  Chlorhexidine Gluconate Cloth  6 each Topical Daily   heparin  5,000 Units Subcutaneous Q8H   insulin aspart  0-15 Units Subcutaneous TID WC   insulin aspart  0-5 Units Subcutaneous QHS   insulin aspart  3 Units Subcutaneous TID WC   insulin glargine-yfgn  15 Units Subcutaneous BID   Continuous Infusions:  cefTRIAXone (ROCEPHIN)  IV 1 g (09/13/23 0619)   insulin 3.2 Units/hr (09/13/23  0617)   metronidazole 500 mg (09/12/23 2146)   [START ON 09/14/2023] vancomycin  PRN Meds:.dextrose, docusate sodium, mouth rinse, polyethylene glycol  Active Hospital Problem list   See systems below  Assessment & Plan:   #Diabetic Ketoacidosis, in setting of UTI, pancreatitis, cocaine, and  ?Noncompliance Hx of controlled T2DM: Home medsLantus 25 units bid, Januvia 100 mg daily, Novolog 15 units tid with meals, Xigduo 04-999 mg daily -Follow DKA Protocol -Aggressive IV fluids -Insulin drip -Follow BMP q4h & Beta-Hydroxybutyric acid q4h -Once Anion Gap is closed and serum Bicarb >20, can convert to SSI & Basal insulin ~ Transition to Semglee 45 units daily, SSI, and 5 units scheduled with meals -Diabetes Coordinator following, appreciate input -Check Hgb A1c    #Acute Kidney Injury ~ IMPROVING #Severe Anion Gap Metabolic Acidosis with Lactic Acidosis ~ IMPROVING #Hyponatremia- likely pseudohyponatremia in the setting of DKA correct for hyperglycemia ~ RESOLVED #Hyperkalemia corrected with  Insulin ~ RESOLVED -Monitor I&O's / urinary output -Follow BMP -Ensure adequate renal perfusion -Avoid nephrotoxic agents as able -Replace electrolytes as indicated ~ Pharmacy following for assistance with electrolyte replacement -IV fluids     #Sepsis secondary... #UTI #Acute Pancreatitis #Concern for possible Staph Epi (mecA/C detected) Bacteremia, ? Contaminant   -Monitor fever curve -Trend WBC's  -Follow cultures as above -Continue empiric Ceftriaxone, start Vancomycin pending cultures & sensitivities -Obtain Echocardiogram to rule out Endocarditis given drug hx -Consult ID, appreciate input  #Essential hypertension #HLD (hyperlipidemia) #Hx SVT (supraventricular tachycardia): -Continuous cardiac monitoring -Maintain MAP >65 -IV fluids -Echocardiogram pending -Prn Hydralazine for SBP >170 -Resume home Diltiazem   #Acute Metabolic Encephalopathy in setting of DKA and  cocaine intoxication #Bipolar disorder and Anxiety and depression #Polysubstance abuse, tobacco abuse, cocaine abuse, alcohol abuse and marijuana: -Treatment of metabolic derangements as outlined above -Provide supportive care -Promote normal sleep/wake cycle and family presence -Avoid sedating medications as able -Resume home Wellbutrin and Gabapentin once able to tolerate PO -Needs counseling about importance of quitting substance   #Asthma: Stable -Supplemental O2 as needed to maintain O2 sats >92% -Follow intermittent Chest X-ray & ABG as needed -Bronchodilators prn -Pulmonary toilet as able     Best practice:  Diet:  Regular Pain/Anxiety/Delirium protocol (if indicated): No VAP protocol (if indicated): Not indicated DVT prophylaxis: Subcutaneous Heparin GI prophylaxis: PPI Glucose control:  Insulin gtt ~ transition to Long acting and SSI Central venous access:  N/A Arterial line:  N/A Foley:  N/A Mobility:  OOB as able PT consulted: N/A Last date of multidisciplinary goals of care discussion [3/4] Code Status:  Full code Disposition: Stepdown   = Goals of Care = Code Status Order: FULL  Primary Emergency Contact: Dennis,Latonya    Care time: 40 minutes    Harlon Ditty, AGACNP-BC [facility] Prefer epic messenger for cross cover needs If after hours, please call E-link

## 2023-09-13 NOTE — Progress Notes (Signed)
 Pharmacy Antibiotic Note  Pamela Lane is a 63 y.o. female admitted on 09/12/2023 with bacteremia.  Pharmacy has been consulted for Vancomycin dosing.  Plan: Pt given Vancomycin 2000 mg once. Vancomycin 1000 mg IV Q 24 hrs. Goal AUC 400-550. Expected AUC: 452.8 SCr used: 1.17  Pharmacy will continue to follow and will adjust abx dosing whenever warranted.  Temp (24hrs), Avg:97.2 F (36.2 C), Min:95.1 F (35.1 C), Max:98.2 F (36.8 C)   Recent Labs  Lab 09/12/23 0414 09/12/23 0611 09/12/23 0755 09/12/23 0838 09/12/23 1222 09/12/23 1528 09/12/23 1938 09/13/23 0031  WBC 21.0*  --   --   --   --   --   --   --   CREATININE 2.69*  --  2.49*  --  2.01* 1.55* 1.30* 1.17*  LATICACIDVEN  --  3.5*  --  2.9*  --   --   --   --     Estimated Creatinine Clearance: 49.7 mL/min (A) (by C-G formula based on SCr of 1.17 mg/dL (H)).    No Known Allergies  Antimicrobials this admission: 3/3 Ceftriaxone (UTI) >>  3/3 Flagyl  (Intra-abdominal) >>   3/5 Vancomycin >>  Microbiology results: 3/3 BCx: 1 (aerobic) of 4 bottles with Staph Epi, mecA/C detected  Thank you for allowing pharmacy to be a part of this patient's care.  Otelia Sergeant, PharmD, MBA 09/13/2023 3:21 AM

## 2023-09-13 NOTE — Plan of Care (Signed)
  Problem: Clinical Measurements: Goal: Ability to maintain clinical measurements within normal limits will improve Outcome: Progressing Goal: Diagnostic test results will improve Outcome: Progressing Goal: Respiratory complications will improve Outcome: Progressing Goal: Cardiovascular complication will be avoided Outcome: Progressing   Problem: Coping: Goal: Level of anxiety will decrease Outcome: Progressing   Problem: Elimination: Goal: Will not experience complications related to bowel motility Outcome: Progressing Goal: Will not experience complications related to urinary retention Outcome: Progressing   Problem: Pain Managment: Goal: General experience of comfort will improve and/or be controlled Outcome: Progressing   Problem: Skin Integrity: Goal: Risk for impaired skin integrity will decrease Outcome: Progressing

## 2023-09-13 NOTE — Progress Notes (Addendum)
 PHARMACY CONSULT NOTE - FOLLOW UP  Pharmacy Consult for Electrolyte Monitoring and Replacement   Recent Labs: Potassium (mmol/L)  Date Value  09/13/2023 3.5   Magnesium (mg/dL)  Date Value  16/04/9603 1.9   Calcium (mg/dL)  Date Value  54/03/8118 8.8 (L)   Albumin (g/dL)  Date Value  14/78/2956 3.2 (L)   Phosphorus (mg/dL)  Date Value  21/30/8657 3.3   Sodium (mmol/L)  Date Value  09/13/2023 136   Assessment: MS is a 63 yo female who presented to the ED after having shortness of breath other the last 3 days. They were found with a elevated BG > 600 and hyperkalemia. They has a past medical history significant for uncontrolled diabetes with recurrent DKA. Pharmacy was consulted to manage this patient's electrolytes while they're in the ICU.   Fluids:  Insulin: Insulin drip at 3.2 units/hr >> 10 units/hr Medications: NA  Goal of Therapy:  Electrolytes WNL  Plan:  No interventions required at this time, continue to monitor for transitioning of insulin gtt to Brenda insulin (already in process) Transition to daily monitoring with morning labs  Effie Shy, PharmD Pharmacy Resident  09/13/2023 6:45 AM

## 2023-09-13 NOTE — TOC Initial Note (Signed)
 Transition of Care [facility]) - Initial/Assessment Note    Patient Details  Name: Pamela Lane MRN: [id number] Date of Birth: 04/14/1961  [facility] CM/SW Contact:    Garret Reddish, RN Phone Number: 09/13/2023, 2:58 PM  Clinical Narrative:                 Chart reviewed.  Noted that patient was admitted with DKA.  On admission patient's blood sugar was over 600.  Patient was on an Insulin drip on admission.  Patient currently  transitioning off insulin drip. Noted that blood culture growing Staph Epi (MecA/C).  ID consulted and Echo ordered for patient.  Patient currently on IV Vancomycin.    I have meet with patient at bedside today.  I have induced myself and my role as TOC Case Manager.  Mrs Hagarty informs me that prior to admission she lived at home with her husband.  She informs me that prior to admission she was independent of ADL's.  She informs me that she has no DME at home.  Mrs. Gerlich reports that her PCP is [facility].  She reports that she also receives her medications from [facility].    Noted that patient's urine Toxicology was positive for Cocaine.  I have asked Mrs. Killian about her drug use.  She informs me that she uses Cocaine every blue moon.  I asked her to elaborate on what every blue moon meant in regards to how many times a day/week she uses drugs.  She continued to say every blue moon.  She also admits to using Marijuana.  She also reports that she uses Marijuana every blue moon.  She informs me that her husband does not use drugs but does some Marijuana.  She reports that she does not use drugs in her home but she goes to her friends home.  She was agreeable to resources for drug use.  I have informed her that I would leave resources on patient discharge instructions.    TOC will continue to follow for discharge planning.    Expected Discharge Plan: Home/Self Care Barriers to Discharge: No Barriers  Identified   Patient Goals and CMS Choice            Expected Discharge Plan and Services   Discharge Planning Services: CM Consult Post Acute Care Choice:  (TBD) Living arrangements for the past 2 months: Single Family Home                                      Prior Living Arrangements/Services Living arrangements for the past 2 months: Single Family Home Lives with:: Spouse Patient language and need for interpreter reviewed:: Yes Do you feel safe going back to the place where you live?: Yes        Care giver support system in place?: Yes (comment) (Patient has a supportive husband)      Activities of Daily Living   ADL Screening (condition at time of admission) Independently performs ADLs?: No Does the patient have a NEW difficulty with bathing/dressing/toileting/self-feeding that is expected to last >3 days?: No Does the patient have a NEW difficulty with getting in/out of bed, walking, or climbing stairs that is expected to last >3 days?: No Does the patient have a NEW difficulty with communication that is expected to last >3 days?: No Is the patient deaf or have difficulty  hearing?: No Does the patient have difficulty seeing, even when wearing glasses/contacts?: No Does the patient have difficulty concentrating, remembering, or making decisions?: Yes  Permission Sought/Granted                  Emotional Assessment Appearance:: Appears stated age Attitude/Demeanor/Rapport: Engaged Affect (typically observed): Appropriate Orientation: : Oriented to Self, Oriented to Place, Oriented to  Time, Oriented to Situation Alcohol / Substance Use: Illicit Drugs (Patient with positive drug screen for Cocaine. Patient does admit to using Marijuana)    Admission diagnosis:  Hyperkalemia [E87.5] DKA (diabetic ketoacidosis) (HCC) [E11.10] Non compliance w medication regimen [Z91.148] Hypothermia, initial encounter [T68.XXXA] Acute dyspnea [R06.00] Acute renal  failure, unspecified acute renal failure type (HCC) [N17.9] Diabetic ketoacidosis without coma associated with type 2 diabetes mellitus (HCC) [E11.10] Patient Active Problem List   Diagnosis Date Noted   Sepsis (HCC) 07/28/2023   Nausea, vomiting and diarrhea 08/21/2022   Chronic kidney disease, stage 3a (HCC) 08/21/2022   Polysubstance abuse (HCC) 08/21/2022   Anxiety and depression 08/21/2022   UTI (urinary tract infection) 08/21/2022   Severe sepsis (HCC) 03/13/2022   SIRS (systemic inflammatory response syndrome) (HCC) 01/11/2021   Hypotension 01/11/2021   Abdominal pain 01/11/2021   Chest pain 01/11/2021   HLD (hyperlipidemia) 01/11/2021   Asthma 01/11/2021   GERD (gastroesophageal reflux disease) 01/11/2021   Hyperkalemia 01/11/2021   Alcohol abuse 01/11/2021   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   Acute metabolic encephalopathy 01/11/2021   Cocaine abuse (HCC)    Acute respiratory distress    SVT (supraventricular tachycardia) (HCC)    Leukocytosis    Hypokalemia    Hypophosphatemia    DKA (diabetic ketoacidosis) (HCC) 08/27/2020   Tobacco abuse 10/04/2019   DKA, type 2 (HCC) 10/02/2019   Essential hypertension 10/02/2019   Bipolar disorder (HCC) 01/17/2019   Acute pancreatitis 05/15/2016   Acute renal failure (HCC)    Diabetic ketoacidosis without coma associated with type 2 diabetes mellitus (HCC)    Hyperglycemia due to type 2 diabetes mellitus (HCC) 01/13/2016   Acute UTI 01/13/2016   DKA (diabetic ketoacidosis) (HCC) 01/08/2016   Hyponatremia 01/08/2016   Dehydration 01/08/2016   Urinary tract infection 01/08/2016   Upper abdominal pain 01/08/2016   Nausea & vomiting 01/08/2016   PCP:  [facility] Pharmacy:   [facility] - [place], [place] - [address] [place] [address] Phone: [phone] Fax: [phone]     Social Drivers of Health (SDOH) Social History: SDOH  Screenings   Food Insecurity: No Food Insecurity (09/12/2023)  Housing: Low Risk  (09/12/2023)  Transportation Needs: No Transportation Needs (09/12/2023)  Utilities: Not At Risk (09/12/2023)  Social Connections: Moderately Isolated (09/12/2023)  Tobacco Use: High Risk (09/12/2023)   SDOH Interventions:     Readmission Risk Interventions    04/11/2023    2:42 PM 01/14/2021   11:03 AM  Readmission Risk Prevention Plan  Transportation Screening Complete Complete  PCP or Specialist Appt within 3-5 Days Complete   Social Work Consult for Recovery Care Planning/Counseling Complete   Palliative Care Screening Not Applicable   Medication Review Oceanographer) Complete Referral to Pharmacy  PCP or Specialist appointment within 3-5 days of discharge  Not Complete  PCP/Specialist Appt Not Complete comments  patient left AMA  HRI or Home Care Consult  Not Complete  HRI or Home Care Consult Pt Refusal Comments  patient is independent  SW Recovery  Care/Counseling Consult  Complete  Palliative Care Screening  Not Applicable  Skilled Nursing Facility  Not Applicable

## 2023-09-14 ENCOUNTER — Inpatient Hospital Stay (HOSPITAL_COMMUNITY)
Admit: 2023-09-14 | Discharge: 2023-09-14 | Disposition: A | Discharge status: Home / Self Care | Payer: MEDICAID | Attending: Pulmonary Disease | Admitting: Pulmonary Disease

## 2023-09-14 DIAGNOSIS — R7881 Bacteremia: Secondary | ICD-10-CM

## 2023-09-14 DIAGNOSIS — N179 Acute kidney failure, unspecified: Secondary | ICD-10-CM | POA: Diagnosis not present

## 2023-09-14 DIAGNOSIS — A4150 Gram-negative sepsis, unspecified: Secondary | ICD-10-CM

## 2023-09-14 DIAGNOSIS — Z794 Long term (current) use of insulin: Secondary | ICD-10-CM

## 2023-09-14 DIAGNOSIS — E0810 Diabetes mellitus due to underlying condition with ketoacidosis without coma: Secondary | ICD-10-CM | POA: Diagnosis not present

## 2023-09-14 DIAGNOSIS — E1165 Type 2 diabetes mellitus with hyperglycemia: Secondary | ICD-10-CM

## 2023-09-14 DIAGNOSIS — E1110 Type 2 diabetes mellitus with ketoacidosis without coma: Secondary | ICD-10-CM | POA: Diagnosis not present

## 2023-09-14 DIAGNOSIS — Z91148 Patient's other noncompliance with medication regimen for other reason: Secondary | ICD-10-CM

## 2023-09-14 DIAGNOSIS — N182 Chronic kidney disease, stage 2 (mild): Secondary | ICD-10-CM

## 2023-09-14 LAB — ECHOCARDIOGRAM COMPLETE
AR max vel: 2.61 cm2
AV Area VTI: 2.53 cm2
AV Area mean vel: 2.37 cm2
AV Mean grad: 2 mmHg
AV Peak grad: 4 mmHg
Ao pk vel: 1 m/s
Area-P 1/2: 8.25 cm2
Height: 63 in
S' Lateral: 2.3 cm
Weight: 2640 [oz_av]

## 2023-09-14 LAB — GLUCOSE, CAPILLARY
Glucose-Capillary: 108 mg/dL — ABNORMAL HIGH (ref 70–99)
Glucose-Capillary: 243 mg/dL — ABNORMAL HIGH (ref 70–99)
Glucose-Capillary: 271 mg/dL — ABNORMAL HIGH (ref 70–99)
Glucose-Capillary: 280 mg/dL — ABNORMAL HIGH (ref 70–99)
Glucose-Capillary: 369 mg/dL — ABNORMAL HIGH (ref 70–99)
Glucose-Capillary: 84 mg/dL (ref 70–99)

## 2023-09-14 LAB — BASIC METABOLIC PANEL
Anion gap: 7 (ref 5–15)
BUN: 21 mg/dL (ref 8–23)
CO2: 19 mmol/L — ABNORMAL LOW (ref 22–32)
Calcium: 8.3 mg/dL — ABNORMAL LOW (ref 8.9–10.3)
Chloride: 110 mmol/L (ref 98–111)
Creatinine, Ser: 0.93 mg/dL (ref 0.44–1.00)
GFR, Estimated: >60 mL/min (ref >60)
Glucose, Bld: 85 mg/dL (ref 70–99)
Potassium: 3.6 mmol/L (ref 3.5–5.1)
Sodium: 136 mmol/L (ref 135–145)

## 2023-09-14 LAB — PHOSPHORUS
Phosphorus: <1 mg/dL — CL (ref 2.5–4.6)
Phosphorus: 2.9 mg/dL (ref 2.5–4.6)

## 2023-09-14 LAB — CBC
HCT: 31.1 % — ABNORMAL LOW (ref 36.0–46.0)
Hemoglobin: 11.2 g/dL — ABNORMAL LOW (ref 12.0–15.0)
MCH: 30.4 pg (ref 26.0–34.0)
MCHC: 36 g/dL (ref 30.0–36.0)
MCV: 84.3 fL (ref 80.0–100.0)
Platelets: 204 10*3/uL (ref 150–400)
RBC: 3.69 MIL/uL — ABNORMAL LOW (ref 3.87–5.11)
RDW: 13.2 % (ref 11.5–15.5)
WBC: 5.8 10*3/uL (ref 4.0–10.5)
nRBC: 0 % (ref 0.0–0.2)

## 2023-09-14 LAB — MAGNESIUM: Magnesium: 1.9 mg/dL (ref 1.7–2.4)

## 2023-09-14 MED ORDER — VANCOMYCIN HCL 1250 MG/250ML IV SOLN: 1250.0000 mg | INTRAVENOUS | Status: DC

## 2023-09-14 MED ORDER — ACETAMINOPHEN 500 MG PO TABS
1000.0000 mg | ORAL_TABLET | Freq: Four times a day (QID) | ORAL | Status: DC | PRN
  Administered 2023-09-14 – 2023-09-16 (×2): 1000 mg via ORAL
  Filled 2023-09-14 (×2): qty 2

## 2023-09-14 MED ORDER — GUAIFENESIN-DM 100-10 MG/5ML PO SYRP
5.0000 mL | ORAL_SOLUTION | ORAL | Status: DC | PRN
Start: 2023-09-14 — End: 2023-09-16
  Administered 2023-09-14 – 2023-09-15 (×3): 5 mL via ORAL
  Filled 2023-09-14 (×3): qty 10

## 2023-09-14 MED ORDER — MELATONIN 5 MG PO TABS
5.0000 mg | ORAL_TABLET | Freq: Every evening | ORAL | Status: DC | PRN
  Administered 2023-09-14 – 2023-09-15 (×2): 5 mg via ORAL
  Filled 2023-09-14 (×2): qty 1

## 2023-09-14 MED ORDER — POTASSIUM PHOSPHATES 15 MMOLE/5ML IV SOLN
45.0000 mmol | Freq: Once | INTRAVENOUS | Status: AC
  Administered 2023-09-14: 45 mmol via INTRAVENOUS
  Filled 2023-09-14: qty 15

## 2023-09-14 NOTE — Progress Notes (Signed)
 Progress Note   Patient: Pamela Lane ZOX:[id number] DOB: Feb 08, 1961 DOA: 09/12/2023     2 DOS: the patient was seen and examined on 09/14/2023   Brief hospital course: Pamela Lane is a 63 year old female with past medical history significant for hypertension, hyperlipidemia, uncontrolled diabetes mellitus, recurrent DKA, anxiety and depression, bipolar, CKD stage II, C. difficile colitis, polysubstance abuse presented to ED with complaints of shortness of breath, hypoxia and elevated glucose readings.  Patient is admitted to ICU for severe DKA in the setting of UTI, mild Acute Pancreatitis, cocaine use, and medication noncompliance.  Patient was started on IV hydration, IV insulin therapy.  His DKA resolved insulin gtt. transition to subcu, transferred to [facility] service for further management.  Assessment and Plan: Diabetic ketoacidosis History of uncontrolled type 2 diabetes mellitus Noncompliance with insulin regimen. Patient's insulin drip transitioned to subcu aspart 4 units 3 times daily, Semglee 45 units daily, Accu-Cheks, sliding scale every 4 hourly. Patient's blood sugars 108-270. Will continue to adjust insulin regimen accordingly. Diabetes educator on board. TOC for discharge needs.  Acute on CKD stage II. Anion gap metabolic acidosis with lactic acidosis. Patient's kidney function improved and is at baseline. Anion gap closed. Avoid nephrotoxic drugs. Continue to monitor daily renal function.  Sepsis of urologic origin Acute pancreatitis Blood cultures positive for staph epi-possibly contaminant Continue IV Rocephin therapy. Stop vancomycin per ID recommendations. Follow repeat blood cultures, urine cultures  Acute metabolic encephalopathy In the setting of DKA, bipolar disorder, polysubstance abuse. Patient mental status improved. Continue neurochecks. Avoid sedating medications. Delirium precautions.   Hyponatremia due to elevated sugars  resolved. Hyperkalemia-improved with IV fluids. Monitor daily electrolytes.  Essential hypertension Blood pressure stable. Continue home dose diltiazem.  Asthma-no exacerbation. Continue bronchodilators as needed. Antitussive medications ordered.  Bipolar disorder: Continue current home medications.     Out of bed to chair. Incentive spirometry. Nursing supportive care. Fall, aspiration precautions. DVT prophylaxis   Code Status: Full Code Subjective: Patient is seen and examined today morning in ICU.  She is lying comfortably.  Has complaints of cough.  Feels weak, did not get out of bed.  Advised to work with PT OT.  Physical Exam: Vitals:   09/14/23 1100 09/14/23 1200 09/14/23 1356 09/14/23 1608  BP: 122/67 125/86 122/84 127/86  Pulse: 93 (!) 107 (!) 102 (!) 102  Resp: 19 (!) 24 16 16   Temp:  98.3 F (36.8 C) 98.4 F (36.9 C) 98.2 F (36.8 C)  TempSrc:  Oral  Oral  SpO2: 98% 99% 100% 100%  Weight:      Height:        General - Elderly African-American female, no apparent distress HEENT - PERRLA, EOMI, atraumatic head, non tender sinuses. Lung - Clear, basal rales, no rhonchi, wheezes. Heart - S1, S2 heard, no murmurs, rubs, trace pedal edema. Abdomen - Soft, non tender, bowel sounds good Neuro - Alert, awake and oriented x 3, non focal exam. Skin - Warm and dry.  Data Reviewed:      Latest Ref Rng & Units 09/14/2023    3:20 AM 09/13/2023    4:06 AM 09/12/2023    4:14 AM  CBC  WBC 4.0 - 10.5 K/uL 5.8  7.6  21.0   Hemoglobin 12.0 - 15.0 g/dL 81.1  91.4  78.2   Hematocrit 36.0 - 46.0 % 31.1  33.0  47.9   Platelets 150 - 400 K/uL 204  235  423       Latest Ref  Rng & Units 09/14/2023    3:20 AM 09/13/2023    5:12 PM 09/13/2023   12:19 PM  BMP  Glucose 70 - 99 mg/dL 85  161  096   BUN 8 - 23 mg/dL 21  28  29    Creatinine 0.44 - 1.00 mg/dL 0.45  4.09  8.11   Sodium 135 - 145 mmol/L 136  134  135   Potassium 3.5 - 5.1 mmol/L 3.6  3.5  3.6   Chloride 98 - 111  mmol/L 110  107  104   CO2 22 - 32 mmol/L 19  19  17    Calcium 8.9 - 10.3 mg/dL 8.3  8.5  8.9    ECHOCARDIOGRAM COMPLETE Result Date: 09/14/2023    ECHOCARDIOGRAM REPORT   Patient Name:   Pamela Lane Date of Exam: 09/14/2023 Medical Rec #:  [id number]          Height:       63.0 in Accession #:    [id number]         Weight:       164.9 lb Date of Birth:  03/19/1961           BSA:          1.781 m Patient Age:    63 years           BP:           125/72 mmHg Patient Gender: F                  HR:           94 bpm. Exam Location:  [facility] Procedure: 2D Echo, Cardiac Doppler, Color Doppler, Strain Analysis and 3D Echo            (Both Spectral and Color Flow Doppler were utilized during            procedure). Indications:     Bacteremia R78.81  History:         Patient has no prior history of Echocardiogram examinations.                  Risk Factors:Hypertension and Diabetes.  Sonographer:     Cristela Blue Referring Phys:  [id number] Pamela Lane Diagnosing Phys: Pamela Nordmann MD  Sonographer Comments: Global longitudinal strain was attempted. IMPRESSIONS  1. Left ventricular ejection fraction, by estimation, is 55 to 60%. Left ventricular ejection fraction by PLAX is 56 %. The left ventricle has normal function. The left ventricle has no regional wall motion abnormalities. Left ventricular diastolic parameters are consistent with Grade I diastolic dysfunction (impaired relaxation). The average left ventricular global longitudinal strain is -10.0 %. The global longitudinal strain is abnormal.  2. Right ventricular systolic function is normal. The right ventricular size is normal.  3. The mitral valve is normal in structure. No evidence of mitral valve regurgitation. No evidence of mitral stenosis.  4. The aortic valve is normal in structure. Aortic valve regurgitation is not visualized. No aortic stenosis is present.  5. The inferior vena cava is normal in size with greater than 50% respiratory variability,  suggesting right atrial pressure of 3 mmHg. FINDINGS  Left Ventricle: Left ventricular ejection fraction, by estimation, is 55 to 60%. Left ventricular ejection fraction by PLAX is 56 %. The left ventricle has normal function. The left ventricle has no regional wall motion abnormalities. The average left ventricular global longitudinal strain is -10.0 %. Strain was performed and the global  longitudinal strain is abnormal. The left ventricular internal cavity size was normal in size. There is no left ventricular hypertrophy. Left ventricular diastolic parameters are consistent with Grade I diastolic dysfunction (impaired relaxation). Right Ventricle: The right ventricular size is normal. No increase in right ventricular wall thickness. Right ventricular systolic function is normal. Left Atrium: Left atrial size was normal in size. Right Atrium: Right atrial size was normal in size. Pericardium: There is no evidence of pericardial effusion. Mitral Valve: The mitral valve is normal in structure. There is mild calcification of the mitral valve leaflet(s). No evidence of mitral valve regurgitation. No evidence of mitral valve stenosis. Tricuspid Valve: The tricuspid valve is normal in structure. Tricuspid valve regurgitation is not demonstrated. No evidence of tricuspid stenosis. Aortic Valve: The aortic valve is normal in structure. Aortic valve regurgitation is not visualized. No aortic stenosis is present. Aortic valve mean gradient measures 2.0 mmHg. Aortic valve peak gradient measures 4.0 mmHg. Aortic valve area, by VTI measures 2.53 cm. Pulmonic Valve: The pulmonic valve was normal in structure. Pulmonic valve regurgitation is not visualized. No evidence of pulmonic stenosis. Aorta: The aortic root is normal in size and structure. Venous: The inferior vena cava is normal in size with greater than 50% respiratory variability, suggesting right atrial pressure of 3 mmHg. IAS/Shunts: No atrial level shunt detected by  color flow Doppler. Additional Comments: 3D was performed not requiring image post processing on an independent workstation and was indeterminate.  LEFT VENTRICLE PLAX 2D LV EF:         Left            Diastology                ventricular     LV e' medial:    4.24 cm/s                ejection        LV E/e' medial:  25.5                fraction by     LV e' lateral:   9.68 cm/s                PLAX is 56      LV E/e' lateral: 11.2                %. LVIDd:         3.20 cm         2D Longitudinal LVIDs:         2.30 cm         Strain LV PW:         0.90 cm         2D Strain GLS   -10.0 % LV IVS:        1.40 cm         Avg: LVOT diam:     2.00 cm LV SV:         47 LV SV Index:   26 LVOT Area:     3.14 cm        3D Volume EF:                                3D EF:        42 % RIGHT VENTRICLE RV Basal diam:  3.00 cm RV Mid diam:    2.40 cm RV S prime:  10.40 cm/s TAPSE (M-mode): 2.7 cm LEFT ATRIUM           Index        RIGHT ATRIUM          Index LA diam:      2.50 cm 1.40 cm/m   RA Area:     8.74 cm LA Vol (A2C): 33.8 ml 18.97 ml/m  RA Volume:   13.40 ml 7.52 ml/m LA Vol (A4C): 21.4 ml 12.01 ml/m  AORTIC VALVE AV Area (Vmax):    2.61 cm AV Area (Vmean):   2.37 cm AV Area (VTI):     2.53 cm AV Vmax:           100.00 cm/s AV Vmean:          71.600 cm/s AV VTI:            0.185 m AV Peak Grad:      4.0 mmHg AV Mean Grad:      2.0 mmHg LVOT Vmax:         83.00 cm/s LVOT Vmean:        54.100 cm/s LVOT VTI:          0.149 m LVOT/AV VTI ratio: 0.81  AORTA Ao Root diam: 3.40 cm MITRAL VALVE                TRICUSPID VALVE MV Area (PHT): 8.25 cm     TR Peak grad:   18.1 mmHg MV Decel Time: 92 msec      TR Vmax:        213.00 cm/s MV E velocity: 108.00 cm/s MV A velocity: 132.00 cm/s  SHUNTS MV E/A ratio:  0.82         Systemic VTI:  0.15 m                             Systemic Diam: 2.00 cm Pamela Nordmann MD Electronically signed by Pamela Nordmann MD Signature Date/Time: 09/14/2023/11:22:43 AM    Final      Family  Communication: Discussed with patient, she understand and agree. All questions answered.  Disposition: Status is: Inpatient Remains inpatient appropriate because: better sugar control, PT eval  Planned Discharge Destination: Home with Home Health     Time spent: 37 minutes  Author: Marcelino Duster, MD 09/14/2023 4:24 PM Secure chat 7am to 7pm For on call review [email or web address].

## 2023-09-14 NOTE — Progress Notes (Signed)
*  PRELIMINARY RESULTS* Echocardiogram 2D Echocardiogram has been performed.  Pamela Lane 09/14/2023, 8:48 AM

## 2023-09-14 NOTE — Plan of Care (Signed)
  Problem: Nutrition: Goal: Adequate nutrition will be maintained Outcome: Progressing   Problem: Coping: Goal: Level of anxiety will decrease Outcome: Progressing   Problem: Pain Managment: Goal: General experience of comfort will improve and/or be controlled Outcome: Progressing   Problem: Metabolic: Goal: Ability to maintain appropriate glucose levels will improve Outcome: Progressing   Problem: Fluid Volume: Goal: Ability to achieve a balanced intake and output will improve Outcome: Progressing

## 2023-09-14 NOTE — Plan of Care (Signed)
  Problem: Education: Goal: Knowledge of General Education information will improve Description: Including pain rating scale, medication(s)/side effects and non-pharmacologic comfort measures Outcome: Progressing   Problem: Health Behavior/Discharge Planning: Goal: Ability to manage health-related needs will improve Outcome: Progressing   Problem: Activity: Goal: Risk for activity intolerance will decrease Outcome: Progressing   Problem: Elimination: Goal: Will not experience complications related to bowel motility Outcome: Progressing   Problem: Pain Managment: Goal: General experience of comfort will improve and/or be controlled Outcome: Progressing   Problem: Metabolic: Goal: Ability to maintain appropriate glucose levels will improve Outcome: Progressing

## 2023-09-14 NOTE — Progress Notes (Signed)
 Pt is being transfer to room 136, report called and given to [employer].

## 2023-09-14 NOTE — Progress Notes (Signed)
 PHARMACY CONSULT NOTE - FOLLOW UP  Pharmacy Consult for Electrolyte Monitoring and Replacement   Recent Labs: Potassium (mmol/L)  Date Value  09/14/2023 3.6   Magnesium (mg/dL)  Date Value  29/56/2130 1.9   Calcium (mg/dL)  Date Value  86/57/8469 8.3 (L)   Albumin (g/dL)  Date Value  62/95/2841 3.2 (L)   Phosphorus (mg/dL)  Date Value  32/44/0102 <1.0 (LL)   Sodium (mmol/L)  Date Value  09/14/2023 136   Assessment: Pamela Lane is a 63 yo female who presented to the ED after having shortness of breath other the last 3 days. They were found with a elevated BG > 600 and hyperkalemia. They has a past medical history significant for uncontrolled diabetes with recurrent DKA. Pharmacy was consulted to manage this patient's electrolytes while they're in the ICU.   Fluids: LR @ 125 mL/hr Insulin: Insulin gtt completed  Medications: NA  Goal of Therapy:  Electrolytes WNL  Plan:  Provider already ordered K Phos 45 mmol IV x 1  Re-check Phos levels in the afternoon and replace further if necessary  Continue daily monitoring with morning labs  Effie Shy, PharmD Pharmacy Resident  09/14/2023 6:45 AM

## 2023-09-14 NOTE — Progress Notes (Signed)
 [facility]  Date of Admission:  09/12/2023   Total days of inpatient antibiotics 3  Principal Problem:   DKA (diabetic ketoacidosis) (HCC) Active Problems:   Acute renal failure [facility])          Assessment: 63 year old female with history of uncontrolled diabetes, recurrent DKA, hypertension, CKD stage III, C. difficile colitis, polysubstance abuse admitted with DKA found to have: #Blood cultures  COnS(staph epi/haemolyticus) #DKA #Leukocytosis-resolved - BC ID from admission shows staph epi .  Both sets with GPC.  Unclear if contamination.  Patient has no bruises/rashes.  No signs of phlebitis reported. - CT abdomen pelvis showed mild stranding around pancreas, patchy peripheral groundglass lung opacity.  Patient today denies any respiratory symptoms, reported as abdominal pain though. - Okay to continue vancomycin as speciation pending.  I think her leukocytosis could be in the setting of DKA.  She remains afebrile.  UA with large leukocytes, negative nitrites, did not reflex to cultures.  Patient denies any respiratory symptoms can stop ceftriaxone metronidazole. Recommendations:  -D/C vancomycin and blood Cx grew 1/2 staph epi and 1/2 stpah haemolyticus, consistent with contamination -ID will sign off  Evaluation of this patient requires complex antimicrobial therapy evaluation and counseling + isolation needs for disease transmission risk assessment and mitigation   Microbiology:   Antibiotics: Vancomycin 3/2-  ceftriaxone, metronidazole 3/3- Cultures: Blood 3/32/2 1/2staph epidermidis 1/2 staph haemolyticus   SUBJECTIVE: Sitting in bed. No new complaints.  Interval: Afebrile overnight. Wbc 5.8k  Review of Systems: Review of Systems  All other systems reviewed and are negative.    Scheduled Meds:  buPROPion ER  150 mg Oral BID   Chlorhexidine Gluconate Cloth  6 each Topical Daily   diltiazem  120 mg Oral Daily   gabapentin  300  mg Oral BID   heparin  5,000 Units Subcutaneous Q8H   insulin aspart  0-20 Units Subcutaneous Q4H   insulin aspart  4 Units Subcutaneous TID WC   insulin glargine-yfgn  45 Units Subcutaneous Daily   multivitamin with minerals  1 tablet Oral Daily   pantoprazole  40 mg Oral Daily   Ensure Max Protein  11 oz Oral BID   thiamine  100 mg Oral Daily   Continuous Infusions:  lactated ringers 125 mL/hr at 09/14/23 0600   potassium PHOSPHATE IVPB (in mmol) 64.4 mL/hr at 09/14/23 1257   PRN Meds:.dextrose, docusate sodium, guaiFENesin-dextromethorphan, hydrALAZINE, ondansetron (ZOFRAN) IV, mouth rinse, polyethylene glycol, sodium chloride flush No Known Allergies  OBJECTIVE: Vitals:   09/14/23 1000 09/14/23 1100 09/14/23 1200 09/14/23 1356  BP: 135/77 122/67 125/86 122/84  Pulse: 94 93 (!) 107 (!) 102  Resp: 15 19 (!) 24 16  Temp:   98.3 F (36.8 C) 98.4 F (36.9 C)  TempSrc:   Oral   SpO2: 95% 98% 99% 100%  Weight:      Height:       Body mass index is 29.21 kg/m.  Physical Exam Constitutional:      Appearance: Normal appearance.  HENT:     Head: Normocephalic and atraumatic.     Right Ear: Tympanic membrane normal.     Left Ear: Tympanic membrane normal.     Nose: Nose normal.     Mouth/Throat:     Mouth: Mucous membranes are moist.  Eyes:     Extraocular Movements: Extraocular movements intact.     Conjunctiva/sclera: Conjunctivae normal.     Pupils: Pupils are  equal, round, and reactive to light.  Cardiovascular:     Rate and Rhythm: Normal rate and regular rhythm.     Heart sounds: No murmur heard.    No friction rub. No gallop.  Pulmonary:     Effort: Pulmonary effort is normal.     Breath sounds: Normal breath sounds.  Abdominal:     General: Abdomen is flat.     Palpations: Abdomen is soft.  Musculoskeletal:        General: Normal range of motion.  Skin:    General: Skin is warm and dry.  Neurological:     General: No focal deficit present.     Mental  Status: She is alert and oriented to person, place, and time.  Psychiatric:        Mood and Affect: Mood normal.       Lab Results Lab Results  Component Value Date   WBC 5.8 09/14/2023   HGB 11.2 (L) 09/14/2023   HCT 31.1 (L) 09/14/2023   MCV 84.3 09/14/2023   PLT 204 09/14/2023    Lab Results  Component Value Date   CREATININE 0.93 09/14/2023   BUN 21 09/14/2023   NA 136 09/14/2023   K 3.6 09/14/2023   CL 110 09/14/2023   CO2 19 (L) 09/14/2023    Lab Results  Component Value Date   ALT 13 09/13/2023   AST 11 (L) 09/13/2023   ALKPHOS 89 09/13/2023   BILITOT 0.6 09/13/2023        Danelle Earthly, MD [facility] 09/14/2023, 2:50 PM

## 2023-09-14 NOTE — Progress Notes (Addendum)
 Pharmacy Antibiotic Note  PRAPTI GRUSSING is a 63 y.o. female admitted on 09/12/2023 with bacteremia. ID is following. Unclear if contamination as patient has no bruises, rashes, no signs of phlebitis reported. Plan is to continue treatment until speciation and/or result of repeat cultures. Pharmacy has been consulted for Vancomycin dosing.  Today, 09/14/2023 Scr 0.93 >> 1.10 (baseline around 0.8) WBC 5.8 >> 7.6  Afebrile   Plan: Day 2 of antibiotics (vancomycin) Day 3 of total antibiotics  Due to improving improving renal function, adjust vancomycin to 1250 mg IV Q24H. Goal AUC 400-550. Expected AUC: 489 Expected Css min: 11.1 SCr used: 0.93  Weight used: IBW, Vd used: 0.72 (BMI 29.2) Continue to monitor renal function and follow repeat culture results Pharmacy will continue to adjust abx dosing whenever warranted.  Temp (24hrs), Avg:98.2 F (36.8 C), Min:97.8 F (36.6 C), Max:98.4 F (36.9 C)   Recent Labs  Lab 09/12/23 0414 09/12/23 0611 09/12/23 0755 09/12/23 0838 09/12/23 1222 09/13/23 0031 09/13/23 0406 09/13/23 1219 09/13/23 1712 09/14/23 0320  WBC 21.0*  --   --   --   --   --  7.6  --   --  5.8  CREATININE 2.69*  --    < >  --    < > 1.17* 1.11* 1.10* 0.98 0.93  LATICACIDVEN  --  3.5*  --  2.9*  --   --   --   --   --   --    < > = values in this interval not displayed.    Estimated Creatinine Clearance: 60.8 mL/min (by C-G formula based on SCr of 0.93 mg/dL).    No Known Allergies  Antimicrobials this admission: 3/3 Ceftriaxone (UTI) >> 3/4 3/3 Flagyl  (Intra-abdominal) >>  3/4 3/5 Vancomycin >>  Microbiology results: 3/3 Respiratory: negative  3/3 MRSA nares: negative  3/3 BCx: 2 (aerobic) of 4 bottles with Staph Epi, mecA/C detected 3/4 Bcx: In process   Thank you for allowing pharmacy to be a part of this patient's care.  Effie Shy, PharmD Pharmacy Resident  09/14/2023 7:12 AM

## 2023-09-14 NOTE — Plan of Care (Signed)
  Problem: Education: Goal: Knowledge of General Education information will improve Description: Including pain rating scale, medication(s)/side effects and non-pharmacologic comfort measures Outcome: Progressing   Problem: Education: Goal: Knowledge of General Education information will improve Description: Including pain rating scale, medication(s)/side effects and non-pharmacologic comfort measures Outcome: Progressing   Problem: Health Behavior/Discharge Planning: Goal: Ability to manage health-related needs will improve Outcome: Progressing   Problem: Health Behavior/Discharge Planning: Goal: Ability to manage health-related needs will improve Outcome: Progressing   Problem: Clinical Measurements: Goal: Diagnostic test results will improve Outcome: Progressing Goal: Respiratory complications will improve Outcome: Progressing   Problem: Clinical Measurements: Goal: Diagnostic test results will improve Outcome: Progressing   Problem: Clinical Measurements: Goal: Respiratory complications will improve Outcome: Progressing   Problem: Nutrition: Goal: Adequate nutrition will be maintained Outcome: Progressing   Problem: Nutrition: Goal: Adequate nutrition will be maintained Outcome: Progressing   Problem: Coping: Goal: Level of anxiety will decrease Outcome: Progressing

## 2023-09-15 DIAGNOSIS — A4150 Gram-negative sepsis, unspecified: Secondary | ICD-10-CM | POA: Diagnosis not present

## 2023-09-15 DIAGNOSIS — E1110 Type 2 diabetes mellitus with ketoacidosis without coma: Secondary | ICD-10-CM | POA: Diagnosis not present

## 2023-09-15 DIAGNOSIS — N179 Acute kidney failure, unspecified: Secondary | ICD-10-CM | POA: Diagnosis not present

## 2023-09-15 DIAGNOSIS — E1165 Type 2 diabetes mellitus with hyperglycemia: Secondary | ICD-10-CM | POA: Diagnosis not present

## 2023-09-15 LAB — CBC
HCT: 32.8 % — ABNORMAL LOW (ref 36.0–46.0)
Hemoglobin: 11.7 g/dL — ABNORMAL LOW (ref 12.0–15.0)
MCH: 30.3 pg (ref 26.0–34.0)
MCHC: 35.7 g/dL (ref 30.0–36.0)
MCV: 85 fL (ref 80.0–100.0)
Platelets: 214 10*3/uL (ref 150–400)
RBC: 3.86 MIL/uL — ABNORMAL LOW (ref 3.87–5.11)
RDW: 13.2 % (ref 11.5–15.5)
WBC: 5.1 10*3/uL (ref 4.0–10.5)
nRBC: 0 % (ref 0.0–0.2)

## 2023-09-15 LAB — GLUCOSE, CAPILLARY
Glucose-Capillary: 233 mg/dL — ABNORMAL HIGH (ref 70–99)
Glucose-Capillary: 274 mg/dL — ABNORMAL HIGH (ref 70–99)
Glucose-Capillary: 236 mg/dL — ABNORMAL HIGH (ref 70–99)
Glucose-Capillary: 88 mg/dL (ref 70–99)
Glucose-Capillary: 180 mg/dL — ABNORMAL HIGH (ref 70–99)
Glucose-Capillary: 246 mg/dL — ABNORMAL HIGH (ref 70–99)

## 2023-09-15 LAB — BASIC METABOLIC PANEL
Anion gap: 8 (ref 5–15)
BUN: 18 mg/dL (ref 8–23)
CO2: 21 mmol/L — ABNORMAL LOW (ref 22–32)
Calcium: 8.2 mg/dL — ABNORMAL LOW (ref 8.9–10.3)
Chloride: 108 mmol/L (ref 98–111)
Creatinine, Ser: 0.81 mg/dL (ref 0.44–1.00)
GFR, Estimated: >60 mL/min (ref >60)
Glucose, Bld: 91 mg/dL (ref 70–99)
Potassium: 3.5 mmol/L (ref 3.5–5.1)
Sodium: 137 mmol/L (ref 135–145)

## 2023-09-15 LAB — CULTURE, BLOOD (ROUTINE X 2)

## 2023-09-15 LAB — PHOSPHORUS: Phosphorus: 2.7 mg/dL (ref 2.5–4.6)

## 2023-09-15 LAB — MAGNESIUM: Magnesium: 2 mg/dL (ref 1.7–2.4)

## 2023-09-15 MED ORDER — INSULIN ASPART 100 UNIT/ML IJ SOLN
10.0000 [IU] | Freq: Three times a day (TID) | INTRAMUSCULAR | Status: DC
Start: 2023-09-15 — End: 2023-09-16
  Administered 2023-09-15 – 2023-09-16 (×4): 10 [IU] via SUBCUTANEOUS
  Filled 2023-09-15 (×4): qty 1

## 2023-09-15 MED ORDER — INSULIN GLARGINE-YFGN 100 UNIT/ML ~~LOC~~ SOLN
50.0000 [IU] | Freq: Every day | SUBCUTANEOUS | Status: DC
  Administered 2023-09-16: 50 [IU] via SUBCUTANEOUS
  Filled 2023-09-15: qty 0.5

## 2023-09-15 MED ORDER — DIPHENOXYLATE-ATROPINE 2.5-0.025 MG PO TABS
1.0000 | ORAL_TABLET | Freq: Four times a day (QID) | ORAL | Status: DC | PRN
  Administered 2023-09-15 – 2023-09-16 (×2): 1 via ORAL
  Filled 2023-09-15 (×2): qty 1

## 2023-09-15 NOTE — Evaluation (Signed)
 Physical Therapy Evaluation Patient Details Name: Pamela Lane MRN: [id number] DOB: July 27, 1960 Today's Date: 09/15/2023  History of Present Illness  Pt is a 63 y.o. female with medical history significant of asthma, polysubstance abuse, type 2 diabetes, hypertension, bipolar disorder presenting with DKA, polysubstance abuse presenting with SOB and DKA.  Clinical Impression  Pt pleasant and interactive, though needing some extra cuing to slow and be more aware due to some impulsivity.  She ultimately was able to ambulate around the nurses' station without AD or LOBs.  She did have a few intermittent stagger steps with hallway distractions (flowers - real and fake, nurses, etc) but ultimately showed reasonable safety and reports only minimal difference from her baseline. She voiced some interest in further PT will maintain on caseload at this time and she/we will decide on need for at home, TBD per progress here.       If plan is discharge home, recommend the following: Assist for transportation;Help with stairs or ramp for entrance   Can travel by private vehicle        Equipment Recommendations None recommended by PT  Recommendations for Other Services       Functional Status Assessment Patient has had a recent decline in their functional status and demonstrates the ability to make significant improvements in function in a reasonable and predictable amount of time.     Precautions / Restrictions Precautions Precautions: Fall Restrictions Weight Bearing Restrictions Per Provider Order: No      Mobility  Bed Mobility Overal bed mobility: Independent             General bed mobility comments: easily gets herself to sitting EOVB, back to supine post ambulation    Transfers Overall transfer level: Independent Equipment used: None               General transfer comment: Pt easily transitions to standing  EOB w/o AD or hesitation     Ambulation/Gait Ambulation/Gait assistance: Supervision Gait Distance (Feet): 250 Feet Assistive device: None         General Gait Details: Pt able to ambulate with confidence and though she had a few small stagger steps (turning to look at flowers in the hall, or talk with nurses) she had no LOBs or overt safety issues.  Minimally elevated HR (to high 110s) with SpO2 staying in the mid 90s.  Overall she reports not feeling far from her baseline  and displayed confidence with the prolonged ambulation effort.  Stairs Stairs: Yes Stairs assistance: Supervision Stair Management: One rail Left Number of Stairs: 6 General stair comments: Pt was able to negotiate up/down steps reciprocally with single rail use.  No hesitation  Wheelchair Mobility     Tilt Bed    Modified Rankin (Stroke Patients Only)       Balance Overall balance assessment: Modified Independent (a few intermittent stagger steps while distracted walking in the hallway but easily self arrests and showed good confidence t/o.)                                           Pertinent Vitals/Pain Pain Assessment Pain Assessment: Faces Faces Pain Scale: Hurts a little bit Pain Location: back/neck    Home Living Family/patient expects to be discharged to:: Private residence Living Arrangements: Spouse/significant other Available Help at Discharge: Family (partner works 3rd shift) Type of Home: House Home Access:  Stairs to enter Entrance Stairs-Rails: Doctor, general practice of Steps: 5   Home Layout: One level Home Equipment: None Additional Comments: (P)  (Partner checks in with pt multiple times throughout night while he is at work.)    Prior Function Prior Level of Function : Independent/Modified Independent;History of Falls (last six months)             Mobility Comments: Ind amb community distances without an AD, walkes around the block for exercise, has had multiple recent  falls ADLs Comments: Indep with IADLs/ADLs     Extremity/Trunk Assessment   Upper Extremity Assessment Upper Extremity Assessment: Overall WFL for tasks assessed    Lower Extremity Assessment Lower Extremity Assessment: Overall WFL for tasks assessed       Communication   Communication Communication: No apparent difficulties    Cognition Arousal: Alert Behavior During Therapy: WFL for tasks assessed/performed, Impulsive   PT - Cognitive impairments: No apparent impairments                         Following commands: Intact       Cueing Cueing Techniques: Verbal cues     General Comments      Exercises     Assessment/Plan    PT Assessment Patient needs continued PT services  PT Problem List Decreased activity tolerance;Decreased balance;Decreased safety awareness;Cardiopulmonary status limiting activity       PT Treatment Interventions Gait training;Stair training    PT Goals (Current goals can be found in the Care Plan section)  Acute Rehab PT Goals Patient Stated Goal: go home PT Goal Formulation: With patient Time For Goal Achievement: 09/28/23 Potential to Achieve Goals: Good    Frequency Min 1X/week     Co-evaluation               AM-PAC PT "6 Clicks" Mobility  Outcome Measure                  End of Session Equipment Utilized During Treatment: Gait belt Activity Tolerance: Patient tolerated treatment well Patient left: in bed;with call bell/phone within reach Nurse Communication: Mobility status PT Visit Diagnosis: Unsteadiness on feet (R26.81)    Time: 6578-4696 PT Time Calculation (min) (ACUTE ONLY): 15 min   Charges:   PT Evaluation $PT Eval Low Complexity: 1 Low   PT General Charges $$ ACUTE PT VISIT: 1 Visit         Malachi Pro, DPT 09/15/2023, 10:30 AM

## 2023-09-15 NOTE — Progress Notes (Signed)
 Occupational Therapy Evaluation Patient Details Name: Pamela Lane MRN: [id number] DOB: 1961-06-25 Today's Date: 09/15/2023   History of Present Illness   Pt is a 63 y.o. female with medical history significant of asthma, polysubstance abuse, type 2 diabetes, hypertension, bipolar disorder presenting with DKA, polysubstance abuse presenting with SOB and DKA.     Clinical Impressions Pt was seen for OT evaluation this date. Prior to hospital admission, pt was MODI in most IADL/ADLs. Upon arrival to room pt seated on EOB, agreeable to tx. Pt alert and oriented x3, ?location and situation. Pt reports she doesn't drive and that her husband takes care of the house/cooking. Pt lives with her husband in their house, with 5 steps to enter, pt emphasizes that she often gets out of breath and needs a rest while walking into home. Pt presents to acute OT demonstrating impaired ADL performance and functional mobility (See OT problem list for additional functional deficits). Bed mobility completed with MODI. Pt completed LB dressing MODI, donning socks while seated on EOB. Toileting + pericare with supervision (due to reported often dizziness with mobility) no physical assistance required.   Pt was alert and very talkitive, unsure what hospital name is, able to state city. Pt completed clock face drawing x2, stated she needing to restart but unable to complete correctly. Wrote numbers 1-12, only on one half of clock both attempts. Will continue to assess cognition at next available date. Pt would benefit from skilled OT services to address noted impairments and functional limitations (see below for any additional details) in order to maximize safety and independence while minimizing falls risk and caregiver burden. Do not anticipate the need for follow up OT services upon acute hospital DC. OT will follow acutely.      If plan is discharge home, recommend the following:   Direct supervision/assist for  financial management;Direct supervision/assist for medications management;Supervision due to cognitive status     Functional Status Assessment   Patient has had a recent decline in their functional status and demonstrates the ability to make significant improvements in function in a reasonable and predictable amount of time.     Equipment Recommendations   None recommended by OT       Mobility Bed Mobility Overal bed mobility: Independent                  Transfers Overall transfer level: Needs assistance Equipment used: None Transfers: Sit to/from Stand Sit to Stand: Supervision           General transfer comment: No physical assitance required during t/f throughout session      Balance Overall balance assessment: Modified Independent, History of Falls (hx of falls, a result of dizziness)                                         ADL either performed or assessed with clinical judgement   ADL Overall ADL's : Needs assistance/impaired;Modified independent     Grooming: Wash/dry hands;Supervision/safety               Lower Body Dressing: Modified independent   Toilet Transfer: Ambulation;Regular Toilet;Supervision/safety   Toileting- Clothing Manipulation and Hygiene: Supervision/safety         General ADL Comments: Pt completed LB dressing MODI, donning socks while seated on EOB. Toileting + pericare with supervision (due to often dizziness with mobility) no physical assistance required.  Pertinent Vitals/Pain Pain Assessment Pain Assessment: 0-10 Pain Score: 9  Pain Location: Back of neck (No face grimicing or presenting signs of discomfort) Pain Descriptors / Indicators: Pounding Pain Intervention(s): Monitored during session, Repositioned     Extremity/Trunk Assessment Upper Extremity Assessment Upper Extremity Assessment: Overall WFL for tasks assessed   Lower Extremity Assessment Lower  Extremity Assessment: Overall WFL for tasks assessed;Defer to PT evaluation       Communication Communication Communication: No apparent difficulties   Cognition Arousal: Alert Behavior During Therapy: WFL for tasks assessed/performed, Impulsive Cognition: No family/caregiver present to determine baseline             OT - Cognition Comments: Pt was alert and very talkitive, unsure what hospital name is, able to state city. Pt completed clock face drawing x2, stated she needing to restart but unable to complete correctly. Wrote numbers 1-12, only on one half of clock both attempts.                 Following commands: Intact       Cueing  General Comments   Cueing Techniques: Verbal cues  Eager to work with therapy, often impulsive with movements and attention   Exercises Exercises: Other exercises Other Exercises Other Exercises: Edu: role of OT, safe ADL completion   Shoulder Instructions      Home Living Family/patient expects to be discharged to:: Private residence Living Arrangements: Spouse/significant other Available Help at Discharge: Family (Partner works 3rd shift) Type of Home: House Home Access: Stairs to enter Secretary/administrator of Steps: 5 Entrance Stairs-Rails: Right;Left Home Layout: One level     Bathroom Shower/Tub: Chief Strategy Officer: Standard     Home Equipment: None   Additional Comments:  (Partner checks in with pt multiple times throughout night while he is at work.)      Prior Functioning/Environment Prior Level of Function : Independent/Modified Independent;History of Falls (last six months)             Mobility Comments:  (No DME use) ADLs Comments: Indep with IADLs/ADLs    OT Problem List: Decreased cognition;Decreased safety awareness;Decreased knowledge of precautions   OT Treatment/Interventions: Self-care/ADL training;DME and/or AE instruction;Energy conservation;Therapeutic  activities;Cognitive remediation/compensation;Balance training      OT Goals(Current goals can be found in the care plan section)   Acute Rehab OT Goals Patient Stated Goal: To return home OT Goal Formulation: With patient Time For Goal Achievement: 09/29/23 Potential to Achieve Goals: Good ADL Goals Pt Will Perform Grooming: with modified independence;standing Additional ADL Goal #1: Pt will demonstrate improved ability to mangage medications by completing the pillbox assessment independently with no errors.   OT Frequency:  Min 1X/week    Co-evaluation              AM-PAC OT "6 Clicks" Daily Activity     Outcome Measure Help from another person eating meals?: None Help from another person taking care of personal grooming?: A Little Help from another person toileting, which includes using toliet, bedpan, or urinal?: None Help from another person bathing (including washing, rinsing, drying)?: A Little Help from another person to put on and taking off regular upper body clothing?: None Help from another person to put on and taking off regular lower body clothing?: None 6 Click Score: 22   End of Session Equipment Utilized During Treatment: Gait belt Nurse Communication: Mobility status  Activity Tolerance: Patient tolerated treatment well Patient left: in bed;with call bell/phone within reach  OT Visit Diagnosis: Unsteadiness on feet (R26.81);History of falling (Z91.81);Dizziness and giddiness (R42)                Time: 1610-9604 OT Time Calculation (min): 24 min Charges:  OT General Charges $OT Visit: 1 Visit OT Evaluation $OT Eval Low Complexity: 1 Low  Glenard Haring M.S. OTR/L  09/15/23, 12:21 PM

## 2023-09-15 NOTE — TOC Initial Note (Signed)
 Transition of Care [facility]) - Initial/Assessment Note    Patient Details  Name: Pamela Lane MRN: [id number] Date of Birth: 1960/11/27  [facility]) CM/SW Contact:    Marlowe Sax, RN Phone Number: 09/15/2023, 9:58 AM  Clinical Narrative:                 The patient is from home with a supportive husband, SDOH substance abuse resources for Cocaine Pos and Social isolation resources added to the AVS to print at DC, Husband to transport home at DC  PCP Center, [facility] - General Practice [phone]  Pharmacy [facility]  Husband to transport home No additional TOC needs identified at this time   Expected Discharge Plan: Home/Self Care Barriers to Discharge: No Barriers Identified   Patient Goals and CMS Choice            Expected Discharge Plan and Services   Discharge Planning Services: CM Consult Post Acute Care Choice:  (TBD) Living arrangements for the past 2 months: Single Family Home                 DME Arranged: N/A DME Agency: NA       HH Arranged: NA          Prior Living Arrangements/Services Living arrangements for the past 2 months: Single Family Home Lives with:: Spouse Patient language and need for interpreter reviewed:: Yes Do you feel safe going back to the place where you live?: Yes      Need for Family Participation in Patient Care: Yes (Comment) Care giver support system in place?: Yes (comment) (Patient has a supportive husband) Current home services: DME Criminal Activity/Legal Involvement Pertinent to Current Situation/Hospitalization: No - Comment as needed  Activities of Daily Living   ADL Screening (condition at time of admission) Independently performs ADLs?: No Does the patient have a NEW difficulty with bathing/dressing/toileting/self-feeding that is expected to last >3 days?: No Does the patient have a NEW difficulty with getting in/out of bed, walking, or climbing stairs that is  expected to last >3 days?: No Does the patient have a NEW difficulty with communication that is expected to last >3 days?: No Is the patient deaf or have difficulty hearing?: No Does the patient have difficulty seeing, even when wearing glasses/contacts?: No Does the patient have difficulty concentrating, remembering, or making decisions?: Yes  Permission Sought/Granted   Permission granted to share information with : Yes, Verbal Permission Granted              Emotional Assessment Appearance:: Appears stated age Attitude/Demeanor/Rapport: Engaged Affect (typically observed): Appropriate Orientation: : Oriented to Self, Oriented to Place, Oriented to  Time, Oriented to Situation Alcohol / Substance Use: Illicit Drugs Psych Involvement: No (comment)  Admission diagnosis:  Hyperkalemia [E87.5] DKA (diabetic ketoacidosis) (HCC) [E11.10] Non compliance w medication regimen [Z91.148] Hypothermia, initial encounter [T68.XXXA] Acute dyspnea [R06.00] Acute renal failure, unspecified acute renal failure type (HCC) [N17.9] Diabetic ketoacidosis without coma associated with type 2 diabetes mellitus (HCC) [E11.10] Patient Active Problem List   Diagnosis Date Noted   Sepsis (HCC) 07/28/2023   Nausea, vomiting and diarrhea 08/21/2022   Chronic kidney disease, stage 3a (HCC) 08/21/2022   Polysubstance abuse (HCC) 08/21/2022   Anxiety and depression 08/21/2022   UTI (urinary tract infection) 08/21/2022   Severe sepsis (HCC) 03/13/2022   SIRS (systemic inflammatory response syndrome) (HCC) 01/11/2021   Hypotension 01/11/2021   Abdominal pain 01/11/2021   Chest pain 01/11/2021  HLD (hyperlipidemia) 01/11/2021   Asthma 01/11/2021   GERD (gastroesophageal reflux disease) 01/11/2021   Hyperkalemia 01/11/2021   Alcohol abuse 01/11/2021   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   Acute metabolic encephalopathy 01/11/2021   Cocaine abuse (HCC)    Acute  respiratory distress    SVT (supraventricular tachycardia) (HCC)    Leukocytosis    Hypokalemia    Hypophosphatemia    DKA (diabetic ketoacidosis) (HCC) 08/27/2020   Tobacco abuse 10/04/2019   DKA, type 2 (HCC) 10/02/2019   Essential hypertension 10/02/2019   Bipolar disorder (HCC) 01/17/2019   Acute pancreatitis 05/15/2016   Acute renal failure (HCC)    Diabetic ketoacidosis without coma associated with type 2 diabetes mellitus (HCC)    Hyperglycemia due to type 2 diabetes mellitus (HCC) 01/13/2016   Acute UTI 01/13/2016   DKA (diabetic ketoacidosis) (HCC) 01/08/2016   Hyponatremia 01/08/2016   Dehydration 01/08/2016   Urinary tract infection 01/08/2016   Upper abdominal pain 01/08/2016   Nausea & vomiting 01/08/2016   PCP:  Center, [facility] Pharmacy:   [facility] - [place], [place] - [address] [place] [address] Phone: [phone] Fax: [phone]     Social Drivers of Health (SDOH) Social History: SDOH Screenings   Food Insecurity: No Food Insecurity (09/12/2023)  Housing: Low Risk  (09/12/2023)  Transportation Needs: No Transportation Needs (09/12/2023)  Utilities: Not At Risk (09/12/2023)  Social Connections: Moderately Isolated (09/12/2023)  Tobacco Use: High Risk (09/12/2023)   SDOH Interventions: Social Connections Interventions: Inpatient TOC   Readmission Risk Interventions    09/15/2023    9:56 AM 04/11/2023    2:42 PM 01/14/2021   11:03 AM  Readmission Risk Prevention Plan  Transportation Screening Complete Complete Complete  PCP or Specialist Appt within 3-5 Days Complete Complete   HRI or Home Care Consult Complete    Social Work Consult for Recovery Care Planning/Counseling Complete Complete   Palliative Care Screening Not Applicable Not Applicable   Medication Review Oceanographer) Referral to Pharmacy Complete Referral to Pharmacy  PCP or Specialist appointment within 3-5 days of discharge   Not Complete   PCP/Specialist Appt Not Complete comments   patient left AMA  HRI or Home Care Consult   Not Complete  HRI or Home Care Consult Pt Refusal Comments   patient is independent  SW Recovery Care/Counseling Consult   Complete  Palliative Care Screening   Not Applicable  Skilled Nursing Facility   Not Applicable

## 2023-09-15 NOTE — Inpatient Diabetes Management (Signed)
 Inpatient Diabetes Program Recommendations  AACE/ADA: New Consensus Statement on Inpatient Glycemic Control (2015)  Target Ranges:  Prepandial:   less than 140 mg/dL      Peak postprandial:   less than 180 mg/dL (1-2 hours)      Critically ill patients:  140 - 180 mg/dL   Lab Results  Component Value Date   GLUCAP 236 (H) 09/15/2023   HGBA1C >15.5 (H) 04/11/2023    Review of Glycemic Control  Latest Reference Range & Units 09/15/23 08:35 09/15/23 11:23  Glucose-Capillary 70 - 99 mg/dL 161 (H) 096 (H)  (H): Data is abnormally high  Diabetes history: DM2 Outpatient Diabetes medications: Lantus 50 units daily, Novolog 15 units every other day, Januvia 100 mg daily Current orders for Inpatient glycemic control:  Semglee 45 units every day, Novolog 0-20 units Q4H, Novolog 10 units TID with meals  Inpatient Diabetes Program Recommendations:    Noted MD increased meal coverage to Novolog 10 units TID this morning.  Might also consider:  Semglee 50 units QAM  Will continue to follow while inpatient.  Thank you, Dulce Sellar, MSN, CDCES Diabetes Coordinator Inpatient Diabetes Program [phone] (team pager from 8a-5p)

## 2023-09-15 NOTE — Progress Notes (Signed)
 Mobility Specialist - Progress Note   09/15/23 1607  Mobility  Activity Ambulated with assistance in hallway  Level of Assistance Standby assist, set-up cues, supervision of patient - no hands on  Assistive Device None  Distance Ambulated (ft) 160 ft  Activity Response Tolerated well  Mobility visit 1 Mobility  Mobility Specialist Start Time (ACUTE ONLY) 1455  Mobility Specialist Stop Time (ACUTE ONLY) 1504  Mobility Specialist Time Calculation (min) (ACUTE ONLY) 9 min   Pt side lying upon entry, utilizing RA. Pt motivated and agreeable to OOB amb this date. Pt completed bed mob indep, STS and amb one lap around the NS with supervision-- intermittent ataxic gait. Pt returned to the room, left supine with alarm set and needs within reach.  Zetta Bills Mobility Specialist 09/15/23 4:12 PM

## 2023-09-15 NOTE — Discharge Instructions (Signed)
 Intensive Outpatient Programs   [facility] [address] [place],  [place], [place] [phone]  [facility] (Inpatient and outpatient)[phone] (Suboxone and Methadone) [address] [phone]  ADS: [facility] Programs - Intensive Outpatient [address] [place], [place] [address], [place]  [phone]  [facility] (Outpatient, Inpatient, [facility] (Groups and Residental) (insurance only) [phone] [place], [place] [phone]   [facility] (for caregivers and family) [address] Laurell Josephs [address] [place], [place] [phone]  Residential Treatment Programs  [facility](2 years) Residential: 60 days)[facility] [address] [place], [place], [place] [phone] or [phone]  [facility] [address] [place], [place], [place] [phone]  [facility] ([facility]) [address] [place], [place], [place] [phone] Admissions: 8am-3pm M-F  [facility] Program: Residential Program [phone] Days)             [facility]: [facility]  [place], [place], [place]  [phone] or [phone] in Hours over the weekend or by referral)  [facility] [address] [place], [place] [address] [phone] (Do virtual or phone assessment, offer transportation within 25 miles, have in patient and Outpatient options)   Mobil Crisis: [facility]:[phone] (for crisis  response 24 hours a day)       Feeling Isolated?  [facility] offers a [employer] that anyone can call toll free at [phone]. The friendship line is available 24 hours a day  [facility] is a Program of All-inclusive Care for the Elderly (PACE). Their mission is to promote and sustain the independence of seniors wishing to remain in the community. They provide seniors with comprehensive long-term health, social, medical and dietary care. Their program is a safe alternative to nursing home care. [phone]  [facility] Physical Address [facility] [address] [place], [place] [address] Phone: [phone]. . Online zoom yoga class, connect with others without leaving your home [facility] offers [place] dance cardio sessions for individuals via Zoom. This program provides: - Dance fitness activities Please contact program for more information. Servinganyone in need adults 18+ hiv/aids individuals families Call [phone]  Email [email or web address] to get more info  [employer] offers an online [employer] to individuals where they can receive help to focus on their best health. Whether you're a [employer] member or not, the neighborhood center offers a... Main Serviceshealth education  exercise & fitness  community support services  recreation  virtual support Other Servicessupport groups Servinganyone in need adults young adults teens seniors individuals families [email or web address] to get more info  Schedule on their website  [facility] offers an array of activities for adults age 38 and over. This program provides:- Fitness and health programs- Tech classes- Activity books Main Serviceshealth education  community support services  exercise & fitness  recreation  more education Servingseniors  Call [phone]    For more resources go online to [email or web address] and  type in you zipcode

## 2023-09-15 NOTE — Progress Notes (Signed)
 Progress Note   Patient: Pamela Lane ZOX:[id number] DOB: Nov 26, 1960 DOA: 09/12/2023     3 DOS: the patient was seen and examined on 09/15/2023   Brief hospital course: Pamela Lane is a 63 year old female with past medical history significant for hypertension, hyperlipidemia, uncontrolled diabetes mellitus, recurrent DKA, anxiety and depression, bipolar, CKD stage II, C. difficile colitis, polysubstance abuse presented to ED with complaints of shortness of breath, hypoxia and elevated glucose readings.  Patient is admitted to ICU for severe DKA in the setting of UTI, mild Acute Pancreatitis, cocaine use, and medication noncompliance.  Patient was started on IV hydration, IV insulin therapy.  His DKA resolved insulin gtt. transition to subcu, transferred to [facility] service for further management.  Assessment and Plan: Diabetic ketoacidosis History of uncontrolled type 2 diabetes mellitus Noncompliance with insulin regimen. Patient's aspart dose increased to 10 units 3 times daily, Semglee increased to 50 from tomorrow.  Continue Accu-Cheks, sliding scale every 4 hourly. Patient's blood sugars around 250 Will continue to adjust insulin regimen accordingly. Diabetes educator on board. TOC for discharge needs.  She wishes to go to STR.  Acute on CKD stage II. Anion gap metabolic acidosis with lactic acidosis. Patient's kidney function improved and is at baseline. Anion gap closed. Avoid nephrotoxic drugs. Continue to monitor daily renal function.  Sepsis of urologic origin Acute pancreatitis resolved Blood cultures positive for staph epi-possibly contaminant Stopped vancomycin per ID recommendations. Follow repeat blood cultures, urine cultures.  Acute metabolic encephalopathy In the setting of DKA, bipolar disorder, polysubstance abuse. Patient mental status improved. Continue neurochecks. Avoid sedating medications. Delirium precautions.  Hyponatremia  improved. Hyperkalemia-improved   Essential hypertension Blood pressure stable. Continue home dose diltiazem.  Asthma-no exacerbation. Continue bronchodilators as needed. Antitussive medications ordered.  Bipolar disorder: Continue current home medications.    Continue Lomotil for loose stools. Out of bed to chair. Incentive spirometry. Nursing supportive care. Fall, aspiration precautions. DVT prophylaxis   Code Status: Full Code.  Subjective: Patient is seen and examined today morning.  She is lying comfortably.  Advised out of bed to chair, work with PT OT.  Has loose stools.  Physical Exam: Vitals:   09/14/23 1608 09/14/23 1930 09/15/23 0554 09/15/23 0831  BP: 127/86 119/69  125/75  Pulse: (!) 102 (!) 101  99  Resp: 16 16  18   Temp: 98.2 F (36.8 C) 98.6 F (37 C)  98.2 F (36.8 C)  TempSrc: Oral Oral    SpO2: 100% 98%  100%  Weight:   79.2 kg   Height:        General - Elderly African-American female, no apparent distress HEENT - PERRLA, EOMI, atraumatic head, non tender sinuses. Lung - Clear, basal rales, no rhonchi, wheezes. Heart - S1, S2 heard, no murmurs, rubs, trace pedal edema. Abdomen - Soft, non tender, bowel sounds good Neuro - Alert, awake and oriented x 3, non focal exam. Skin - Warm and dry.  Data Reviewed:     Latest Ref Rng & Units 09/15/2023    4:45 AM 09/14/2023    3:20 AM 09/13/2023    4:06 AM  CBC  WBC 4.0 - 10.5 K/uL 5.1  5.8  7.6   Hemoglobin 12.0 - 15.0 g/dL 81.1  91.4  78.2   Hematocrit 36.0 - 46.0 % 32.8  31.1  33.0   Platelets 150 - 400 K/uL 214  204  235       Latest Ref Rng & Units 09/15/2023    4:45  AM 09/14/2023    3:20 AM 09/13/2023    5:12 PM  BMP  Glucose 70 - 99 mg/dL 91  85  829   BUN 8 - 23 mg/dL 18  21  28    Creatinine 0.44 - 1.00 mg/dL 5.62  1.30  8.65   Sodium 135 - 145 mmol/L 137  136  134   Potassium 3.5 - 5.1 mmol/L 3.5  3.6  3.5   Chloride 98 - 111 mmol/L 108  110  107   CO2 22 - 32 mmol/L 21  19  19     Calcium 8.9 - 10.3 mg/dL 8.2  8.3  8.5    ECHOCARDIOGRAM COMPLETE Result Date: 09/14/2023    ECHOCARDIOGRAM REPORT   Patient Name:   Pamela Lane Date of Exam: 09/14/2023 Medical Rec #:  [id number]          Height:       63.0 in Accession #:    [id number]         Weight:       164.9 lb Date of Birth:  March 22, 1961           BSA:          1.781 m Patient Age:    63 years           BP:           125/72 mmHg Patient Gender: F                  HR:           94 bpm. Exam Location:  [facility] Procedure: 2D Echo, Cardiac Doppler, Color Doppler, Strain Analysis and 3D Echo            (Both Spectral and Color Flow Doppler were utilized during            procedure). Indications:     Bacteremia R78.81  History:         Patient has no prior history of Echocardiogram examinations.                  Risk Factors:Hypertension and Diabetes.  Sonographer:     Cristela Blue Referring Phys:  [id number] Judithe Modest Diagnosing Phys: Julien Nordmann MD  Sonographer Comments: Global longitudinal strain was attempted. IMPRESSIONS  1. Left ventricular ejection fraction, by estimation, is 55 to 60%. Left ventricular ejection fraction by PLAX is 56 %. The left ventricle has normal function. The left ventricle has no regional wall motion abnormalities. Left ventricular diastolic parameters are consistent with Grade I diastolic dysfunction (impaired relaxation). The average left ventricular global longitudinal strain is -10.0 %. The global longitudinal strain is abnormal.  2. Right ventricular systolic function is normal. The right ventricular size is normal.  3. The mitral valve is normal in structure. No evidence of mitral valve regurgitation. No evidence of mitral stenosis.  4. The aortic valve is normal in structure. Aortic valve regurgitation is not visualized. No aortic stenosis is present.  5. The inferior vena cava is normal in size with greater than 50% respiratory variability, suggesting right atrial pressure of 3 mmHg. FINDINGS  Left  Ventricle: Left ventricular ejection fraction, by estimation, is 55 to 60%. Left ventricular ejection fraction by PLAX is 56 %. The left ventricle has normal function. The left ventricle has no regional wall motion abnormalities. The average left ventricular global longitudinal strain is -10.0 %. Strain was performed and the global longitudinal strain is abnormal. The left ventricular  internal cavity size was normal in size. There is no left ventricular hypertrophy. Left ventricular diastolic parameters are consistent with Grade I diastolic dysfunction (impaired relaxation). Right Ventricle: The right ventricular size is normal. No increase in right ventricular wall thickness. Right ventricular systolic function is normal. Left Atrium: Left atrial size was normal in size. Right Atrium: Right atrial size was normal in size. Pericardium: There is no evidence of pericardial effusion. Mitral Valve: The mitral valve is normal in structure. There is mild calcification of the mitral valve leaflet(s). No evidence of mitral valve regurgitation. No evidence of mitral valve stenosis. Tricuspid Valve: The tricuspid valve is normal in structure. Tricuspid valve regurgitation is not demonstrated. No evidence of tricuspid stenosis. Aortic Valve: The aortic valve is normal in structure. Aortic valve regurgitation is not visualized. No aortic stenosis is present. Aortic valve mean gradient measures 2.0 mmHg. Aortic valve peak gradient measures 4.0 mmHg. Aortic valve area, by VTI measures 2.53 cm. Pulmonic Valve: The pulmonic valve was normal in structure. Pulmonic valve regurgitation is not visualized. No evidence of pulmonic stenosis. Aorta: The aortic root is normal in size and structure. Venous: The inferior vena cava is normal in size with greater than 50% respiratory variability, suggesting right atrial pressure of 3 mmHg. IAS/Shunts: No atrial level shunt detected by color flow Doppler. Additional Comments: 3D was performed  not requiring image post processing on an independent workstation and was indeterminate.  LEFT VENTRICLE PLAX 2D LV EF:         Left            Diastology                ventricular     LV e' medial:    4.24 cm/s                ejection        LV E/e' medial:  25.5                fraction by     LV e' lateral:   9.68 cm/s                PLAX is 56      LV E/e' lateral: 11.2                %. LVIDd:         3.20 cm         2D Longitudinal LVIDs:         2.30 cm         Strain LV PW:         0.90 cm         2D Strain GLS   -10.0 % LV IVS:        1.40 cm         Avg: LVOT diam:     2.00 cm LV SV:         47 LV SV Index:   26 LVOT Area:     3.14 cm        3D Volume EF:                                3D EF:        42 % RIGHT VENTRICLE RV Basal diam:  3.00 cm RV Mid diam:    2.40 cm RV S prime:     10.40 cm/s TAPSE (M-mode):  2.7 cm LEFT ATRIUM           Index        RIGHT ATRIUM          Index LA diam:      2.50 cm 1.40 cm/m   RA Area:     8.74 cm LA Vol (A2C): 33.8 ml 18.97 ml/m  RA Volume:   13.40 ml 7.52 ml/m LA Vol (A4C): 21.4 ml 12.01 ml/m  AORTIC VALVE AV Area (Vmax):    2.61 cm AV Area (Vmean):   2.37 cm AV Area (VTI):     2.53 cm AV Vmax:           100.00 cm/s AV Vmean:          71.600 cm/s AV VTI:            0.185 m AV Peak Grad:      4.0 mmHg AV Mean Grad:      2.0 mmHg LVOT Vmax:         83.00 cm/s LVOT Vmean:        54.100 cm/s LVOT VTI:          0.149 m LVOT/AV VTI ratio: 0.81  AORTA Ao Root diam: 3.40 cm MITRAL VALVE                TRICUSPID VALVE MV Area (PHT): 8.25 cm     TR Peak grad:   18.1 mmHg MV Decel Time: 92 msec      TR Vmax:        213.00 cm/s MV E velocity: 108.00 cm/s MV A velocity: 132.00 cm/s  SHUNTS MV E/A ratio:  0.82         Systemic VTI:  0.15 m                             Systemic Diam: 2.00 cm Julien Nordmann MD Electronically signed by Julien Nordmann MD Signature Date/Time: 09/14/2023/11:22:43 AM    Final    Family Communication: Discussed with patient, she understand and  agree. All questions answered.  Disposition: Status is: Inpatient Remains inpatient appropriate because: better sugar control, PT eval  Planned Discharge Destination: Rehab     Time spent: 36 minutes  Author: Marcelino Duster, MD 09/15/2023 4:44 PM Secure chat 7am to 7pm For on call review [email or web address].

## 2023-09-15 NOTE — Progress Notes (Signed)
 PHARMACY CONSULT NOTE - FOLLOW UP  Pharmacy Consult for Electrolyte Monitoring and Replacement   Recent Labs: Potassium (mmol/L)  Date Value  09/15/2023 3.5   Magnesium (mg/dL)  Date Value  60/45/4098 2.0   Calcium (mg/dL)  Date Value  11/91/4782 8.2 (L)   Albumin (g/dL)  Date Value  95/62/1308 3.2 (L)   Phosphorus (mg/dL)  Date Value  65/78/4696 2.7   Sodium (mmol/L)  Date Value  09/15/2023 137   Assessment: MS is a 63 yo female who presented to the ED after having shortness of breath other the last 3 days. They were found with a elevated BG > 600 and hyperkalemia. They has a past medical history significant for uncontrolled diabetes with recurrent DKA. Pharmacy was consulted to manage this patient's electrolytes while they're in the ICU.   Fluids: None Insulin: Insulin gtt completed  Medications: NA  Goal of Therapy:  Electrolytes WNL  Plan:  No supplementation needed Pharmacy will sign off given patient is no longer in the ICU  Merryl Hacker, PharmD Clinical Pharmacist 09/15/2023 7:14 AM

## 2023-09-16 ENCOUNTER — Other Ambulatory Visit: Payer: Self-pay

## 2023-09-16 DIAGNOSIS — E66811 Obesity, class 1: Secondary | ICD-10-CM | POA: Diagnosis not present

## 2023-09-16 DIAGNOSIS — E1110 Type 2 diabetes mellitus with ketoacidosis without coma: Secondary | ICD-10-CM | POA: Diagnosis not present

## 2023-09-16 DIAGNOSIS — Z6831 Body mass index (BMI) 31.0-31.9, adult: Secondary | ICD-10-CM

## 2023-09-16 DIAGNOSIS — N179 Acute kidney failure, unspecified: Secondary | ICD-10-CM | POA: Diagnosis not present

## 2023-09-16 DIAGNOSIS — E6609 Other obesity due to excess calories: Secondary | ICD-10-CM

## 2023-09-16 DIAGNOSIS — F319 Bipolar disorder, unspecified: Secondary | ICD-10-CM | POA: Diagnosis not present

## 2023-09-16 LAB — CBC
HCT: 32.8 % — ABNORMAL LOW (ref 36.0–46.0)
Hemoglobin: 11.3 g/dL — ABNORMAL LOW (ref 12.0–15.0)
MCH: 29.7 pg (ref 26.0–34.0)
MCHC: 34.5 g/dL (ref 30.0–36.0)
MCV: 86.1 fL (ref 80.0–100.0)
Platelets: 228 10*3/uL (ref 150–400)
RBC: 3.81 MIL/uL — ABNORMAL LOW (ref 3.87–5.11)
RDW: 13.3 % (ref 11.5–15.5)
WBC: 5.1 10*3/uL (ref 4.0–10.5)
nRBC: 0 % (ref 0.0–0.2)

## 2023-09-16 LAB — BASIC METABOLIC PANEL
Anion gap: 10 (ref 5–15)
BUN: 14 mg/dL (ref 8–23)
CO2: 21 mmol/L — ABNORMAL LOW (ref 22–32)
Calcium: 8.4 mg/dL — ABNORMAL LOW (ref 8.9–10.3)
Chloride: 105 mmol/L (ref 98–111)
Creatinine, Ser: 0.78 mg/dL (ref 0.44–1.00)
GFR, Estimated: >60 mL/min (ref >60)
Glucose, Bld: 166 mg/dL — ABNORMAL HIGH (ref 70–99)
Potassium: 3.6 mmol/L (ref 3.5–5.1)
Sodium: 136 mmol/L (ref 135–145)

## 2023-09-16 LAB — GLUCOSE, CAPILLARY
Glucose-Capillary: 184 mg/dL — ABNORMAL HIGH (ref 70–99)
Glucose-Capillary: 125 mg/dL — ABNORMAL HIGH (ref 70–99)

## 2023-09-16 LAB — PHOSPHORUS: Phosphorus: 2.8 mg/dL (ref 2.5–4.6)

## 2023-09-16 LAB — MAGNESIUM: Magnesium: 2 mg/dL (ref 1.7–2.4)

## 2023-09-16 MED ORDER — ADULT MULTIVITAMIN W/MINERALS CH
1.0000 | ORAL_TABLET | Freq: Every day | ORAL | 2 refills | Status: AC
  Filled 2023-09-16: qty 30, 30d supply, fill #0

## 2023-09-16 MED ORDER — ACCU-CHEK SOFTCLIX LANCETS MISC
1.0000 | Freq: Three times a day (TID) | 0 refills | Status: AC
  Filled 2023-09-16: qty 100, 34d supply, fill #0

## 2023-09-16 MED ORDER — OMEPRAZOLE 20 MG PO CPDR
20.0000 mg | DELAYED_RELEASE_CAPSULE | Freq: Every day | ORAL | 0 refills | Status: AC
  Filled 2023-09-16: qty 30, 30d supply, fill #0

## 2023-09-16 MED ORDER — GABAPENTIN 300 MG PO CAPS
300.0000 mg | ORAL_CAPSULE | Freq: Two times a day (BID) | ORAL | 0 refills | Status: DC
  Filled 2023-09-16: qty 60, 30d supply, fill #0

## 2023-09-16 MED ORDER — BUPROPION HCL ER (SR) 150 MG PO TB12
150.0000 mg | ORAL_TABLET | Freq: Two times a day (BID) | ORAL | 0 refills | Status: AC
  Filled 2023-09-16: qty 60, 30d supply, fill #0

## 2023-09-16 MED ORDER — BLOOD GLUCOSE TEST VI STRP
1.0000 | ORAL_STRIP | Freq: Three times a day (TID) | 0 refills | Status: AC
Start: 2023-09-16 — End: ?
  Filled 2023-09-16: qty 100, 34d supply, fill #0

## 2023-09-16 MED ORDER — INSULIN ASPART 100 UNIT/ML FLEXPEN
10.0000 [IU] | PEN_INJECTOR | Freq: Three times a day (TID) | SUBCUTANEOUS | 11 refills | Status: DC
  Filled 2023-09-16: qty 15, 50d supply, fill #0

## 2023-09-16 MED ORDER — BLOOD GLUCOSE MONITOR SYSTEM W/DEVICE KIT
1.0000 | PACK | Freq: Three times a day (TID) | 0 refills | Status: AC
  Filled 2023-09-16: qty 1, 30d supply, fill #0

## 2023-09-16 MED ORDER — DEXTROMETHORPHAN-GUAIFENESIN 20-200 MG/20ML PO LIQD
10.0000 mL | ORAL | 0 refills | Status: AC | PRN
  Filled 2023-09-16: qty 118, 2d supply, fill #0

## 2023-09-16 MED ORDER — INSULIN GLARGINE 100 UNIT/ML SOLOSTAR PEN
25.0000 [IU] | PEN_INJECTOR | Freq: Two times a day (BID) | SUBCUTANEOUS | 0 refills | Status: DC
  Filled 2023-09-16: qty 15, 30d supply, fill #0

## 2023-09-16 MED ORDER — ATORVASTATIN CALCIUM 40 MG PO TABS
40.0000 mg | ORAL_TABLET | Freq: Every day | ORAL | 2 refills | Status: AC
  Filled 2023-09-16: qty 30, 30d supply, fill #0

## 2023-09-16 MED ORDER — INSULIN PEN NEEDLE 31G X 8 MM MISC
0 refills | Status: AC
  Filled 2023-09-16: qty 100, 25d supply, fill #0

## 2023-09-16 MED ORDER — DILTIAZEM HCL ER COATED BEADS 120 MG PO CP24
120.0000 mg | ORAL_CAPSULE | Freq: Every day | ORAL | 2 refills | Status: AC
Start: 2023-09-16 — End: 2023-10-16
  Filled 2023-09-16 (×2): qty 30, 30d supply, fill #0

## 2023-09-16 MED ORDER — SITAGLIPTIN PHOSPHATE 100 MG PO TABS
100.0000 mg | ORAL_TABLET | Freq: Every day | ORAL | 0 refills | Status: AC
  Filled 2023-09-16: qty 30, 30d supply, fill #0

## 2023-09-16 NOTE — Progress Notes (Signed)
 Mobility Specialist - Progress Note   09/16/23 1044  Mobility  Activity Ambulated with assistance in hallway  Level of Assistance Standby assist, set-up cues, supervision of patient - no hands on  Assistive Device None  Distance Ambulated (ft) 320 ft  Activity Response Tolerated well  Mobility visit 1 Mobility  Mobility Specialist Start Time (ACUTE ONLY) 1039  Mobility Specialist Stop Time (ACUTE ONLY) 1044  Mobility Specialist Time Calculation (min) (ACUTE ONLY) 5 min   Pt amb two laps around the NS with supervision, tolerated well. Pt returned to the room, left seated EOB with needs within reach. RN notified.  Zetta Bills Mobility Specialist 09/16/23 10:47 AM

## 2023-09-16 NOTE — NC FL2 (Signed)
 [place] MEDICAID FL2 LEVEL OF CARE FORM     IDENTIFICATION  Patient Name: Pamela Lane Birthdate: April 16, 1961 Sex: female Admission Date (Current Location): 09/12/2023  [facility] and [place] Number:  Chiropodist and Address:  [facility], [address] [place], [place] [address]      Provider Number: [id number]  Attending Physician Name and Address:  Marcelino Duster, MD  Relative Name and Phone Number:  Michell Heinrich  Daughter  Emergency Contact  [phone]  [address]  [place] [address]    Current Level of Care: Hospital Recommended Level of Care: Skilled Nursing Facility Prior Approval Number:    Date Approved/Denied:   PASRR Number: pending  Discharge Plan: Home    Current Diagnoses: Patient Active Problem List   Diagnosis Date Noted   Sepsis (HCC) 07/28/2023   Nausea, vomiting and diarrhea 08/21/2022   Chronic kidney disease, stage 3a (HCC) 08/21/2022   Polysubstance abuse (HCC) 08/21/2022   Anxiety and depression 08/21/2022   UTI (urinary tract infection) 08/21/2022   Severe sepsis (HCC) 03/13/2022   SIRS (systemic inflammatory response syndrome) (HCC) 01/11/2021   Hypotension 01/11/2021   Abdominal pain 01/11/2021   Chest pain 01/11/2021   HLD (hyperlipidemia) 01/11/2021   Asthma 01/11/2021   GERD (gastroesophageal reflux disease) 01/11/2021   Hyperkalemia 01/11/2021   Alcohol abuse 01/11/2021   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   Acute metabolic encephalopathy 01/11/2021   Cocaine abuse (HCC)    Acute respiratory distress    SVT (supraventricular tachycardia) (HCC)    Leukocytosis    Hypokalemia    Hypophosphatemia    DKA (diabetic ketoacidosis) (HCC) 08/27/2020   Tobacco abuse 10/04/2019   DKA, type 2 (HCC) 10/02/2019   Essential hypertension 10/02/2019   Bipolar disorder (HCC) 01/17/2019   Acute pancreatitis 05/15/2016   Acute renal failure (HCC)     Diabetic ketoacidosis without coma associated with type 2 diabetes mellitus (HCC)    Hyperglycemia due to type 2 diabetes mellitus (HCC) 01/13/2016   Acute UTI 01/13/2016   DKA (diabetic ketoacidosis) (HCC) 01/08/2016   Hyponatremia 01/08/2016   Dehydration 01/08/2016   Urinary tract infection 01/08/2016   Upper abdominal pain 01/08/2016   Nausea & vomiting 01/08/2016    Orientation RESPIRATION BLADDER Height & Weight     Self, Time, Situation, Place  Normal Continent Weight: 80 kg Height:  5\' 3"  (160 cm)  BEHAVIORAL SYMPTOMS/MOOD NEUROLOGICAL BOWEL NUTRITION STATUS      Continent Diet (diabetic)  AMBULATORY STATUS COMMUNICATION OF NEEDS Skin   Supervision Verbally Normal                       Personal Care Assistance Level of Assistance              Functional Limitations Info  Sight, Hearing, Speech Sight Info: Adequate Hearing Info: Adequate Speech Info: Adequate    SPECIAL CARE FACTORS FREQUENCY  PT (By licensed PT), OT (By licensed OT)     PT Frequency: 2 times per week OT Frequency: none            Contractures Contractures Info: Not present    Additional Factors Info  Code Status, Allergies Code Status Info: full code Allergies Info: nkda           Current Medications (09/16/2023):  This is the current hospital active medication list Current Facility-Administered Medications  Medication Dose Route Frequency Provider Last Rate Last Admin  acetaminophen (TYLENOL) tablet 1,000 mg  1,000 mg Oral Q6H PRN Jimmye Norman, NP   1,000 mg at 09/16/23 0151   buPROPion (WELLBUTRIN SR) 12 hr tablet 150 mg  150 mg Oral BID Harlon Ditty D, NP   150 mg at 09/16/23 7253   Chlorhexidine Gluconate Cloth 2 % PADS 6 each  6 each Topical Daily Raechel Chute, MD   6 each at 09/15/23 2124   dextrose 50 % solution 0-50 mL  0-50 mL Intravenous PRN Judithe Modest, NP       diltiazem (CARDIZEM CD) 24 hr capsule 120 mg  120 mg Oral Daily Harlon Ditty  D, NP   120 mg at 09/16/23 6644   diphenoxylate-atropine (LOMOTIL) 2.5-0.025 MG per tablet 1 tablet  1 tablet Oral QID PRN Marcelino Duster, MD   1 tablet at 09/16/23 0214   docusate sodium (COLACE) capsule 100 mg  100 mg Oral BID PRN Jimmye Norman, NP       gabapentin (NEURONTIN) capsule 300 mg  300 mg Oral BID Harlon Ditty D, NP   300 mg at 09/16/23 0842   guaiFENesin-dextromethorphan (ROBITUSSIN DM) 100-10 MG/5ML syrup 5 mL  5 mL Oral Q4H PRN Marcelino Duster, MD   5 mL at 09/15/23 0309   heparin injection 5,000 Units  5,000 Units Subcutaneous Q8H Jimmye Norman, NP   5,000 Units at 09/16/23 0347   hydrALAZINE (APRESOLINE) injection 10 mg  10 mg Intravenous Q6H PRN Harlon Ditty D, NP       insulin aspart (novoLOG) injection 0-20 Units  0-20 Units Subcutaneous Q4H Rust[place], Britton L, NP   3 Units at 09/16/23 0844   insulin aspart (novoLOG) injection 10 Units  10 Units Subcutaneous TID WC Marcelino Duster, MD   10 Units at 09/16/23 0843   insulin glargine-yfgn (SEMGLEE) injection 50 Units  50 Units Subcutaneous Daily Marcelino Duster, MD   50 Units at 09/16/23 0844   melatonin tablet 5 mg  5 mg Oral QHS PRN Jimmye Norman, NP   5 mg at 09/15/23 2045   multivitamin with minerals tablet 1 tablet  1 tablet Oral Daily Raechel Chute, MD   1 tablet at 09/16/23 0842   ondansetron (ZOFRAN) injection 4 mg  4 mg Intravenous Q6H PRN Judithe Modest, NP   4 mg at 09/15/23 0747   Oral care mouth rinse  15 mL Mouth Rinse PRN Raechel Chute, MD       pantoprazole (PROTONIX) EC tablet 40 mg  40 mg Oral Daily Harlon Ditty D, NP   40 mg at 09/16/23 0842   polyethylene glycol (MIRALAX / GLYCOLAX) packet 17 g  17 g Oral Daily PRN Jimmye Norman, NP       protein supplement (ENSURE MAX) liquid  11 oz Oral BID Raechel Chute, MD   11 oz at 09/15/23 2125   sodium chloride flush (NS) 0.9 % injection 10-40 mL  10-40 mL Intracatheter PRN Raechel Chute, MD       thiamine (VITAMIN B1) tablet 100 mg  100 mg Oral Daily Raechel Chute, MD   100 mg at 09/16/23 4259     Discharge Medications: Please see discharge summary for a list of discharge medications.  Relevant Imaging Results:  Relevant Lab Results:   Additional Information SS# [id number]  Marlowe Sax, RN

## 2023-09-16 NOTE — Plan of Care (Signed)

## 2023-09-16 NOTE — Discharge Summary (Signed)
 Physician Discharge Summary   Patient: Pamela Lane MRN: [id number] DOB: [birth year]  Admit date:     09/12/2023  Discharge date: 09/16/23  Discharge Physician: Marcelino Duster   PCP: [facility]   Recommendations at discharge:    PCP follow up in 1 week  Discharge Diagnoses: Principal Problem:   DKA (diabetic ketoacidosis) (HCC) Active Problems:   Acute renal failure (HCC)  Resolved Problems:   * No resolved hospital problems. [facility] Course: Pamela Lane is a 63 year old female with past medical history significant for hypertension, hyperlipidemia, uncontrolled diabetes mellitus, recurrent DKA, anxiety and depression, bipolar, CKD stage II, C. difficile colitis, polysubstance abuse presented to ED with complaints of shortness of breath, hypoxia and elevated glucose readings. Patient is admitted to ICU for severe DKA in the setting of UTI, mild Acute Pancreatitis, cocaine use, and medication noncompliance. Patient was started on IV hydration, IV insulin therapy. Her DKA resolved insulin gtt. transition to subcu, transferred to [facility] service for further management. She wished to go to rehab, but able to ambulate well with PT. She is hemodynamically stable to be discharged home, advised to stop illicit drug use, PCP follow up. She understands and agrees.  Assessment and Plan: Diabetic ketoacidosis History of uncontrolled type 2 diabetes mellitus Noncompliance with insulin regimen. DKA resolved. Advised to be compliant with diet, insulin regimen. Home dose Lantus 25 bid, aspart 10 tid, Sitagliptin resumed prior to discharge. Advised to follow PCP upon discharge. All med scripts sent to our pharmacy.    Acute on CKD stage II. Anion gap metabolic acidosis with lactic acidosis. Patient's kidney function improved and is at baseline. Anion gap closed. Avoid nephrotoxic drugs. Continue to monitor renal function as outpatient.   Sepsis of urologic  origin Acute pancreatitis resolved Blood cultures positive for staph epi-possibly contaminant Stopped vancomycin per ID recommendations. Repeat blood cultures negative.   Acute metabolic encephalopathy In the setting of DKA, bipolar disorder, polysubstance abuse. She is AAOX3. Patient mental status improved.  Hyponatremia improved. Hyperkalemia-improved    Essential hypertension Blood pressure stable. Continue home dose diltiazem.   Asthma-no exacerbation. Continue bronchodilators as needed. Antitussive medications ordered.   Bipolar disorder: Continue Wellbutrin therapy.       Consultants: none Procedures performed: none  Disposition: Home Diet recommendation:  Discharge Diet Orders (From admission, onward)     Start     Ordered   09/16/23 0000  Diet - low sodium heart healthy        09/16/23 1043   09/16/23 0000  Diet Carb Modified        09/16/23 1043           Cardiac and Carb modified diet DISCHARGE MEDICATION: Allergies as of 09/16/2023   No Known Allergies      Medication List     TAKE these medications    atorvastatin 40 MG tablet Commonly known as: LIPITOR TAKE 1 TABLET BY MOUTH AT BEDTIME.   Blood Glucose Monitoring Suppl Devi 1 each by Does not apply route 3 (three) times daily. May dispense any manufacturer covered by patient's insurance.   BLOOD GLUCOSE TEST STRIPS Strp 1 each by Does not apply route 3 (three) times daily. Use as directed to check blood sugar. May dispense any manufacturer covered by patient's insurance and fits patient's device.   buPROPion 150 MG 12 hr tablet Commonly known as: WELLBUTRIN SR Take 1 tablet (150 mg total) by mouth 2 (two) times daily.   Dilt-XR 120 MG  24 hr capsule Generic drug: diltiazem Take 1 capsule (120 mg total) by mouth daily.   gabapentin 300 MG capsule Commonly known as: NEURONTIN Take 1 capsule (300 mg total) by mouth 2 (two) times daily.   guaiFENesin-dextromethorphan 100-10 MG/5ML  syrup Commonly known as: ROBITUSSIN DM Take 5 mLs by mouth every 4 (four) hours as needed for cough.   insulin aspart 100 UNIT/ML injection Commonly known as: novoLOG Inject 10 Units into the skin 3 (three) times daily with meals.   insulin glargine 100 UNIT/ML Solostar Pen Commonly known as: LANTUS Inject 25 Units into the skin 2 (two) times daily. What changed:  how much to take when to take this   Insulin Pen Needle 30G X 8 MM Misc Commonly known as: NOVOFINE Enough insulin pen needles for insulin glargine 25 units BID & novolog 15 units TID x 30 days. No refills   Lancets Misc 1 each by Does not apply route 3 (three) times daily. Use as directed to check blood sugar. May dispense any manufacturer covered by patient's insurance and fits patient's device.   multivitamin with minerals Tabs tablet Take 1 tablet by mouth daily.   omeprazole 20 MG capsule Commonly known as: PRILOSEC Take 1 capsule (20 mg total) by mouth daily.   sitaGLIPtin 100 MG tablet Commonly known as: Januvia Take 1 tablet (100 mg total) by mouth daily.        Follow-up Information     [facility] Follow up in 1 week(s).   Specialty: General Practice Contact information: [address] [place] [address] [phone]                Discharge Exam: Filed Weights   09/14/23 0500 09/15/23 0554 09/16/23 0446  Weight: 74.8 kg 79.2 kg 80 kg      09/16/2023    7:49 AM 09/16/2023    4:46 AM 09/15/2023    9:18 PM  Vitals with BMI  Weight  176 lbs 6 oz   BMI  31.25   Systolic 136  129  Diastolic 74  81  Pulse 93  98   General - Elderly African-American female, no apparent distress HEENT - PERRLA, EOMI, atraumatic head, non tender sinuses. Lung - Clear, basal rales, no rhonchi, wheezes. Heart - S1, S2 heard, no murmurs, rubs, trace pedal edema. Abdomen - Soft, non tender, bowel sounds good Neuro - Alert, awake and oriented x 3, non focal exam. Skin - Warm and  dry.     Condition at discharge: stable  The results of significant diagnostics from this hospitalization (including imaging, microbiology, ancillary and laboratory) are listed below for reference.   Imaging Studies: ECHOCARDIOGRAM COMPLETE Result Date: 09/14/2023    ECHOCARDIOGRAM REPORT   Patient Name:   ALIZANDRA LOH [facility] Date of Exam: 09/14/2023 Medical Rec #:  [id number]          Height:       63.0 in Accession #:    [id number]         Weight:       164.9 lb Date of Birth:  1960/08/21           BSA:          1.781 m Patient Age:    63 years           BP:           125/72 mmHg Patient Gender: F  HR:           94 bpm. Exam Location:  [facility] Procedure: 2D Echo, Cardiac Doppler, Color Doppler, Strain Analysis and 3D Echo            (Both Spectral and Color Flow Doppler were utilized during            procedure). Indications:     Bacteremia R78.81  History:         Patient has no prior history of Echocardiogram examinations.                  Risk Factors:Hypertension and Diabetes.  Sonographer:     Cristela Blue Referring Phys:  [id number] Judithe Modest Diagnosing Phys: Julien Nordmann MD  Sonographer Comments: Global longitudinal strain was attempted. IMPRESSIONS  1. Left ventricular ejection fraction, by estimation, is 55 to 60%. Left ventricular ejection fraction by PLAX is 56 %. The left ventricle has normal function. The left ventricle has no regional wall motion abnormalities. Left ventricular diastolic parameters are consistent with Grade I diastolic dysfunction (impaired relaxation). The average left ventricular global longitudinal strain is -10.0 %. The global longitudinal strain is abnormal.  2. Right ventricular systolic function is normal. The right ventricular size is normal.  3. The mitral valve is normal in structure. No evidence of mitral valve regurgitation. No evidence of mitral stenosis.  4. The aortic valve is normal in structure. Aortic valve regurgitation is not visualized. No  aortic stenosis is present.  5. The inferior vena cava is normal in size with greater than 50% respiratory variability, suggesting right atrial pressure of 3 mmHg. FINDINGS  Left Ventricle: Left ventricular ejection fraction, by estimation, is 55 to 60%. Left ventricular ejection fraction by PLAX is 56 %. The left ventricle has normal function. The left ventricle has no regional wall motion abnormalities. The average left ventricular global longitudinal strain is -10.0 %. Strain was performed and the global longitudinal strain is abnormal. The left ventricular internal cavity size was normal in size. There is no left ventricular hypertrophy. Left ventricular diastolic parameters are consistent with Grade I diastolic dysfunction (impaired relaxation). Right Ventricle: The right ventricular size is normal. No increase in right ventricular wall thickness. Right ventricular systolic function is normal. Left Atrium: Left atrial size was normal in size. Right Atrium: Right atrial size was normal in size. Pericardium: There is no evidence of pericardial effusion. Mitral Valve: The mitral valve is normal in structure. There is mild calcification of the mitral valve leaflet(s). No evidence of mitral valve regurgitation. No evidence of mitral valve stenosis. Tricuspid Valve: The tricuspid valve is normal in structure. Tricuspid valve regurgitation is not demonstrated. No evidence of tricuspid stenosis. Aortic Valve: The aortic valve is normal in structure. Aortic valve regurgitation is not visualized. No aortic stenosis is present. Aortic valve mean gradient measures 2.0 mmHg. Aortic valve peak gradient measures 4.0 mmHg. Aortic valve area, by VTI measures 2.53 cm. Pulmonic Valve: The pulmonic valve was normal in structure. Pulmonic valve regurgitation is not visualized. No evidence of pulmonic stenosis. Aorta: The aortic root is normal in size and structure. Venous: The inferior vena cava is normal in size with greater than  50% respiratory variability, suggesting right atrial pressure of 3 mmHg. IAS/Shunts: No atrial level shunt detected by color flow Doppler. Additional Comments: 3D was performed not requiring image post processing on an independent workstation and was indeterminate.  LEFT VENTRICLE PLAX 2D LV EF:  Left            Diastology                ventricular     LV e' medial:    4.24 cm/s                ejection        LV E/e' medial:  25.5                fraction by     LV e' lateral:   9.68 cm/s                PLAX is 56      LV E/e' lateral: 11.2                %. LVIDd:         3.20 cm         2D Longitudinal LVIDs:         2.30 cm         Strain LV PW:         0.90 cm         2D Strain GLS   -10.0 % LV IVS:        1.40 cm         Avg: LVOT diam:     2.00 cm LV SV:         47 LV SV Index:   26 LVOT Area:     3.14 cm        3D Volume EF:                                3D EF:        42 % RIGHT VENTRICLE RV Basal diam:  3.00 cm RV Mid diam:    2.40 cm RV S prime:     10.40 cm/s TAPSE (M-mode): 2.7 cm LEFT ATRIUM           Index        RIGHT ATRIUM          Index LA diam:      2.50 cm 1.40 cm/m   RA Area:     8.74 cm LA Vol (A2C): 33.8 ml 18.97 ml/m  RA Volume:   13.40 ml 7.52 ml/m LA Vol (A4C): 21.4 ml 12.01 ml/m  AORTIC VALVE AV Area (Vmax):    2.61 cm AV Area (Vmean):   2.37 cm AV Area (VTI):     2.53 cm AV Vmax:           100.00 cm/s AV Vmean:          71.600 cm/s AV VTI:            0.185 m AV Peak Grad:      4.0 mmHg AV Mean Grad:      2.0 mmHg LVOT Vmax:         83.00 cm/s LVOT Vmean:        54.100 cm/s LVOT VTI:          0.149 m LVOT/AV VTI ratio: 0.81  AORTA Ao Root diam: 3.40 cm MITRAL VALVE                TRICUSPID VALVE MV Area (PHT): 8.25 cm     TR Peak grad:   18.1 mmHg MV Decel Time: 92 msec      TR  Vmax:        213.00 cm/s MV E velocity: 108.00 cm/s MV A velocity: 132.00 cm/s  SHUNTS MV E/A ratio:  0.82         Systemic VTI:  0.15 m                             Systemic Diam: 2.00 cm Julien Nordmann MD Electronically signed by Julien Nordmann MD Signature Date/Time: 09/14/2023/11:22:43 AM    Final    CT ABDOMEN PELVIS WO CONTRAST Result Date: 09/12/2023 CLINICAL DATA:  Pancreatitis. EXAM: CT ABDOMEN AND PELVIS WITHOUT CONTRAST TECHNIQUE: Multidetector CT imaging of the abdomen and pelvis was performed following the standard protocol without IV contrast. RADIATION DOSE REDUCTION: This exam was performed according to the departmental dose-optimization program which includes automated exposure control, adjustment of the mA and/or kV according to patient size and/or use of iterative reconstruction technique. COMPARISON:  CT 07/28/2023. FINDINGS: Lower chest: There are some patchy peripheral areas of opacity in the lung bases, left greater than right. Please correlate for etiology including infectious or inflammatory arge including atypical viral process. These are new from previous. Hepatobiliary: Focal area of fat deposition seen liver adjacent to the falciform ligament in segment 4, unchanged from previous. Gallbladder is present. Pancreas: Mild pancreatic atrophy. Mild peripancreatic fat stranding. Please correlate with history of pancreatitis. No clear fluid collection identified. If there is further concern of the sequela pancreatitis a contrast study could be performed when clinically appropriate. Spleen: Normal in size without focal abnormality. Adrenals/Urinary Tract: Adrenal glands are unremarkable. Kidneys are normal, without renal calculi, focal lesion, or hydronephrosis. Bladder is unremarkable. Stomach/Bowel: Stomach is within normal limits. Appendix appears normal. No evidence of bowel wall thickening, distention, or inflammatory changes. Vascular/Lymphatic: Aortic atherosclerosis. No enlarged abdominal or pelvic lymph nodes. Reproductive: Status post hysterectomy. No adnexal masses. Other: No free air or free fluid.  Some breathing motion. Musculoskeletal: Degenerative changes seen of the spine  and pelvis particularly at the right hip and lower lumbar spine. Also chondrocalcinosis along the pubic symphysis. IMPRESSION: Mild stranding around the pancreas. Please correlate with findings of pancreatitis. If there is further concern of the sequela pancreatitis a contrast study may be useful for further sensitivity. No bowel obstruction, free air.  Normal appendix. There is patchy peripheral ground-glass type lung opacities along the lung bases, left greater than right. This was not seen previously. Possibilities include infectious or inflammatory process including atypical viral process such as coronavirus. Please correlate with clinical history Electronically Signed   By: Karen Kays M.D.   On: 09/12/2023 13:59   DG Chest Portable 1 View Result Date: 09/12/2023 CLINICAL DATA:  3 day history of shortness of breath. EXAM: PORTABLE CHEST 1 VIEW COMPARISON:  07/28/2023 FINDINGS: The lungs are clear without focal pneumonia, edema, pneumothorax or pleural effusion. The cardiopericardial silhouette is within normal limits for size. No acute bony abnormality. Telemetry leads overlie the chest. IMPRESSION: No active disease. Electronically Signed   By: Kennith Center M.D.   On: 09/12/2023 05:18    Microbiology: Results for orders placed or performed during the hospital encounter of 09/12/23  Resp panel by RT-PCR (RSV, Flu A&B, Covid) Anterior Nasal Swab     Status: None   Collection Time: 09/12/23  4:13 AM   Specimen: Anterior Nasal Swab  Result Value Ref Range Status   SARS Coronavirus 2 by RT PCR NEGATIVE NEGATIVE Final    Comment: (NOTE)  SARS-CoV-2 target nucleic acids are NOT DETECTED.  The SARS-CoV-2 RNA is generally detectable in upper respiratory specimens during the acute phase of infection. The lowest concentration of SARS-CoV-2 viral copies this assay can detect is 138 copies/mL. A negative result does not preclude SARS-Cov-2 infection and should not be used as the sole basis for treatment  or other patient management decisions. A negative result may occur with  improper specimen collection/handling, submission of specimen other than nasopharyngeal swab, presence of viral mutation(s) within the areas targeted by this assay, and inadequate number of viral copies(<138 copies/mL). A negative result must be combined with clinical observations, patient history, and epidemiological information. The expected result is Negative.  Fact Sheet for Patients:  BloggerCourse.com  Fact Sheet for Healthcare Providers:  SeriousBroker.it  This test is no t yet approved or cleared by the [place] FDA and  has been authorized for detection and/or diagnosis of SARS-CoV-2 by FDA under an Emergency Use Authorization (EUA). This EUA will remain  in effect (meaning this test can be used) for the duration of the COVID-19 declaration under Section 564(b)(1) of the Act, 21 U.S.C.section 360bbb-3(b)(1), unless the authorization is terminated  or revoked sooner.       Influenza A by PCR NEGATIVE NEGATIVE Final   Influenza B by PCR NEGATIVE NEGATIVE Final    Comment: (NOTE) The Xpert Xpress SARS-CoV-2/FLU/RSV plus assay is intended as an aid in the diagnosis of influenza from Nasopharyngeal swab specimens and should not be used as a sole basis for treatment. Nasal washings and aspirates are unacceptable for Xpert Xpress SARS-CoV-2/FLU/RSV testing.  Fact Sheet for Patients: BloggerCourse.com  Fact Sheet for Healthcare Providers: SeriousBroker.it  This test is not yet approved or cleared by the [place] FDA and has been authorized for detection and/or diagnosis of SARS-CoV-2 by FDA under an Emergency Use Authorization (EUA). This EUA will remain in effect (meaning this test can be used) for the duration of the COVID-19 declaration under Section 564(b)(1) of the Act, 21 U.S.C. section  360bbb-3(b)(1), unless the authorization is terminated or revoked.     Resp Syncytial Virus by PCR NEGATIVE NEGATIVE Final    Comment: (NOTE) Fact Sheet for Patients: BloggerCourse.com  Fact Sheet for Healthcare Providers: SeriousBroker.it  This test is not yet approved or cleared by the [place] FDA and has been authorized for detection and/or diagnosis of SARS-CoV-2 by FDA under an Emergency Use Authorization (EUA). This EUA will remain in effect (meaning this test can be used) for the duration of the COVID-19 declaration under Section 564(b)(1) of the Act, 21 U.S.C. section 360bbb-3(b)(1), unless the authorization is terminated or revoked.  Performed at [facility], [address] [place], [place] [address]   Culture, blood (Routine X 2) w Reflex to ID Panel     Status: Abnormal   Collection Time: 09/12/23  6:11 AM   Specimen: BLOOD RIGHT ARM  Result Value Ref Range Status   Specimen Description   Final    BLOOD RIGHT ARM Performed at [facility], [address], [place], [place] [address]    Special Requests   Final    BOTTLES DRAWN AEROBIC AND ANAEROBIC Blood Culture results may not be optimal due to an inadequate volume of blood received in culture bottles Performed at [facility], [address] [place], [place] [address]    Culture  Setup Time   Final    GRAM POSITIVE COCCI AEROBIC BOTTLE ONLY CRITICAL RESULT CALLED TO, READ BACK BY  AND VERIFIED WITH:  NATHAN BELUE AT 2300 09/12/23 JG    Culture (A)  Final    STAPHYLOCOCCUS HAEMOLYTICUS STAPHYLOCOCCUS EPIDERMIDIS THE SIGNIFICANCE OF ISOLATING THIS ORGANISM FROM A SINGLE SET OF BLOOD CULTURES WHEN MULTIPLE SETS ARE DRAWN IS UNCERTAIN. PLEASE NOTIFY THE MICROBIOLOGY DEPARTMENT WITHIN ONE WEEK IF SPECIATION AND SENSITIVITIES ARE REQUIRED. Performed at [facility], [address] [place], [place] [address]    Report Status  09/15/2023 FINAL  Final  MRSA Next Gen by PCR, Nasal     Status: None   Collection Time: 09/12/23  6:11 AM   Specimen: Nasal Mucosa; Nasal Swab  Result Value Ref Range Status   MRSA by PCR Next Gen NOT DETECTED NOT DETECTED Final    Comment: (NOTE) The GeneXpert MRSA Assay (FDA approved for NASAL specimens only), is one component of a comprehensive MRSA colonization surveillance program. It is not intended to diagnose MRSA infection nor to guide or monitor treatment for MRSA infections. Test performance is not FDA approved in patients less than 61 years old. Performed at [facility], [address] [place], [place] [address]   Blood Culture ID Panel (Reflexed)     Status: Abnormal   Collection Time: 09/12/23  6:11 AM  Result Value Ref Range Status   Enterococcus faecalis NOT DETECTED NOT DETECTED Final   Enterococcus Faecium NOT DETECTED NOT DETECTED Final   Listeria monocytogenes NOT DETECTED NOT DETECTED Final   Staphylococcus species DETECTED (A) NOT DETECTED Final    Comment: CRITICAL RESULT CALLED TO, READ BACK BY AND VERIFIED WITH:  NATHAN BELUE AT 2300 09/12/23 JG    Staphylococcus aureus (BCID) NOT DETECTED NOT DETECTED Final   Staphylococcus epidermidis DETECTED (A) NOT DETECTED Final    Comment: Methicillin (oxacillin) resistant coagulase negative staphylococcus. Possible blood culture contaminant (unless isolated from more than one blood culture draw or clinical case suggests pathogenicity). No antibiotic treatment is indicated for blood  culture contaminants. CRITICAL RESULT CALLED TO, READ BACK BY AND VERIFIED WITH:  NATHAN BELUE AT 2300 09/12/23 JG    Staphylococcus lugdunensis NOT DETECTED NOT DETECTED Final   Streptococcus species NOT DETECTED NOT DETECTED Final   Streptococcus agalactiae NOT DETECTED NOT DETECTED Final   Streptococcus pneumoniae NOT DETECTED NOT DETECTED Final   Streptococcus pyogenes NOT DETECTED NOT DETECTED Final    A.calcoaceticus-baumannii NOT DETECTED NOT DETECTED Final   Bacteroides fragilis NOT DETECTED NOT DETECTED Final   Enterobacterales NOT DETECTED NOT DETECTED Final   Enterobacter cloacae complex NOT DETECTED NOT DETECTED Final   Escherichia coli NOT DETECTED NOT DETECTED Final   Klebsiella aerogenes NOT DETECTED NOT DETECTED Final   Klebsiella oxytoca NOT DETECTED NOT DETECTED Final   Klebsiella pneumoniae NOT DETECTED NOT DETECTED Final   Proteus species NOT DETECTED NOT DETECTED Final   Salmonella species NOT DETECTED NOT DETECTED Final   Serratia marcescens NOT DETECTED NOT DETECTED Final   Haemophilus influenzae NOT DETECTED NOT DETECTED Final   Neisseria meningitidis NOT DETECTED NOT DETECTED Final   Pseudomonas aeruginosa NOT DETECTED NOT DETECTED Final   Stenotrophomonas maltophilia NOT DETECTED NOT DETECTED Final   Candida albicans NOT DETECTED NOT DETECTED Final   Candida auris NOT DETECTED NOT DETECTED Final   Candida glabrata NOT DETECTED NOT DETECTED Final   Candida krusei NOT DETECTED NOT DETECTED Final   Candida parapsilosis NOT DETECTED NOT DETECTED Final   Candida tropicalis NOT DETECTED NOT DETECTED Final   Cryptococcus neoformans/gattii NOT DETECTED NOT DETECTED Final   Methicillin  resistance mecA/C DETECTED (A) NOT DETECTED Final    Comment: CRITICAL RESULT CALLED TO, READ BACK BY AND VERIFIED WITH:  Dawayne Cirri AT 2300 09/12/23 JG Performed at [facility], [address] [place], [place] [address]   Culture, blood (Routine X 2) w Reflex to ID Panel     Status: Abnormal   Collection Time: 09/12/23  6:27 AM   Specimen: BLOOD LEFT ARM  Result Value Ref Range Status   Specimen Description   Final    BLOOD LEFT ARM Performed at [facility], [address], [place], [place] [address]    Special Requests   Final    BOTTLES DRAWN AEROBIC AND ANAEROBIC Blood Culture results may not be optimal due to an inadequate volume of blood received in culture  bottles Performed at [facility], [address], [place], [place] [address]    Culture  Setup Time   Final    GRAM POSITIVE COCCI CRITICAL VALUE NOTED.  VALUE IS CONSISTENT WITH PREVIOUSLY REPORTED AND CALLED VALUE. AEROBIC BOTTLE ONLY Performed at [facility], [address] [place], [place] [address]    Culture STAPHYLOCOCCUS EPIDERMIDIS (A)  Final   Report Status 09/15/2023 FINAL  Final   Organism ID, Bacteria STAPHYLOCOCCUS EPIDERMIDIS  Final      Susceptibility   Staphylococcus epidermidis - MIC*    CIPROFLOXACIN <=0.5 SENSITIVE Sensitive     ERYTHROMYCIN >=8 RESISTANT Resistant     GENTAMICIN <=0.5 SENSITIVE Sensitive     OXACILLIN >=4 RESISTANT Resistant     TETRACYCLINE >=16 RESISTANT Resistant     VANCOMYCIN 1 SENSITIVE Sensitive     TRIMETH/SULFA 160 RESISTANT Resistant     CLINDAMYCIN <=0.25 SENSITIVE Sensitive     RIFAMPIN <=0.5 SENSITIVE Sensitive     Inducible Clindamycin NEGATIVE Sensitive     * STAPHYLOCOCCUS EPIDERMIDIS  Respiratory (~20 pathogens) panel by PCR     Status: None   Collection Time: 09/12/23  5:06 PM   Specimen: Nasopharyngeal Swab; Respiratory  Result Value Ref Range Status   Adenovirus NOT DETECTED NOT DETECTED Final   Coronavirus 229E NOT DETECTED NOT DETECTED Final    Comment: (NOTE) The Coronavirus on the Respiratory Panel, DOES NOT test for the novel  Coronavirus (2019 nCoV)    Coronavirus HKU1 NOT DETECTED NOT DETECTED Final   Coronavirus NL63 NOT DETECTED NOT DETECTED Final   Coronavirus OC43 NOT DETECTED NOT DETECTED Final   Metapneumovirus NOT DETECTED NOT DETECTED Final   Rhinovirus / Enterovirus NOT DETECTED NOT DETECTED Final   Influenza A NOT DETECTED NOT DETECTED Final   Influenza B NOT DETECTED NOT DETECTED Final   Parainfluenza Virus 1 NOT DETECTED NOT DETECTED Final   Parainfluenza Virus 2 NOT DETECTED NOT DETECTED Final   Parainfluenza Virus 3 NOT DETECTED NOT DETECTED Final   Parainfluenza Virus 4  NOT DETECTED NOT DETECTED Final   Respiratory Syncytial Virus NOT DETECTED NOT DETECTED Final   Bordetella pertussis NOT DETECTED NOT DETECTED Final   Bordetella Parapertussis NOT DETECTED NOT DETECTED Final   Chlamydophila pneumoniae NOT DETECTED NOT DETECTED Final   Mycoplasma pneumoniae NOT DETECTED NOT DETECTED Final    Comment: Performed at [facility], [address] [place], [place] [address]  Culture, blood (Routine X 2) w Reflex to ID Panel     Status: None (Preliminary result)   Collection Time: 09/13/23 10:33 AM   Specimen: BLOOD LEFT ARM  Result Value Ref Range Status   Specimen Description BLOOD  LEFT ARM  Final   Special Requests   Final    BOTTLES DRAWN AEROBIC AND ANAEROBIC Blood Culture adequate volume   Culture   Final    NO GROWTH 3 DAYS Performed at [facility], [address] [place], [place] [address]    Report Status PENDING  Incomplete  Culture, blood (Routine X 2) w Reflex to ID Panel     Status: None (Preliminary result)   Collection Time: 09/13/23 10:33 AM   Specimen: BLOOD LEFT HAND  Result Value Ref Range Status   Specimen Description BLOOD LEFT HAND  Final   Special Requests   Final    BOTTLES DRAWN AEROBIC ONLY Blood Culture adequate volume   Culture   Final    NO GROWTH 3 DAYS Performed at [facility], [address] [place], [place] [address]    Report Status PENDING  Incomplete    Labs: CBC: Recent Labs  Lab 09/12/23 0414 09/13/23 0406 09/14/23 0320 09/15/23 0445 09/16/23 0423  WBC 21.0* 7.6 5.8 5.1 5.1  NEUTROABS 18.0*  --   --   --   --   HGB 14.6 11.9* 11.2* 11.7* 11.3*  HCT 47.9* 33.0* 31.1* 32.8* 32.8*  MCV 96.6 83.3 84.3 85.0 86.1  PLT 423* 235 204 214 228   Basic Metabolic Panel: Recent Labs  Lab 09/13/23 1219 09/13/23 1712 09/14/23 0320 09/14/23 1935 09/15/23 0445 09/16/23 0423  NA 135 134* 136  --  137 136  K 3.6 3.5 3.6  --  3.5 3.6  CL 104 107 110  --  108 105  CO2 17* 19* 19*  --   21* 21*  GLUCOSE 273* 177* 85  --  91 166*  BUN 29* 28* 21  --  18 14  CREATININE 1.10* 0.98 0.93  --  0.81 0.78  CALCIUM 8.9 8.5* 8.3*  --  8.2* 8.4*  MG  --   --  1.9  --  2.0 2.0  PHOS  --   --  <1.0* 2.9 2.7 2.8   Liver Function Tests: Recent Labs  Lab 09/12/23 0414 09/13/23 0406  AST 14* 11*  ALT 20 13  ALKPHOS 150* 89  BILITOT 2.1* 0.6  PROT 9.5* 6.6  ALBUMIN 4.8 3.2*   CBG: Recent Labs  Lab 09/15/23 1647 09/15/23 1949 09/15/23 2327 09/16/23 0440 09/16/23 0751  GLUCAP 88 180* 246* 184* 125*    Discharge time spent:  36 minutes.  Signed: Marcelino Duster, MD [facility] 09/16/2023

## 2023-09-16 NOTE — Plan of Care (Signed)
 Problem: Education: Goal: Knowledge of General Education information will improve Description: Including pain rating scale, medication(s)/side effects and non-pharmacologic comfort measures Outcome: Adequate for Discharge   Problem: Health Behavior/Discharge Planning: Goal: Ability to manage health-related needs will improve Outcome: Adequate for Discharge   Problem: Clinical Measurements: Goal: Ability to maintain clinical measurements within normal limits will improve Outcome: Adequate for Discharge Goal: Will remain free from infection Outcome: Adequate for Discharge Goal: Diagnostic test results will improve Outcome: Adequate for Discharge Goal: Respiratory complications will improve Outcome: Adequate for Discharge Goal: Cardiovascular complication will be avoided Outcome: Adequate for Discharge   Problem: Activity: Goal: Risk for activity intolerance will decrease Outcome: Adequate for Discharge   Problem: Nutrition: Goal: Adequate nutrition will be maintained Outcome: Adequate for Discharge   Problem: Coping: Goal: Level of anxiety will decrease Outcome: Adequate for Discharge   Problem: Elimination: Goal: Will not experience complications related to bowel motility Outcome: Adequate for Discharge Goal: Will not experience complications related to urinary retention Outcome: Adequate for Discharge   Problem: Pain Managment: Goal: General experience of comfort will improve and/or be controlled Outcome: Adequate for Discharge   Problem: Safety: Goal: Ability to remain free from injury will improve Outcome: Adequate for Discharge   Problem: Skin Integrity: Goal: Risk for impaired skin integrity will decrease Outcome: Adequate for Discharge   Problem: Education: Goal: Ability to describe self-care measures that may prevent or decrease complications (Diabetes Survival Skills Education) will improve Outcome: Adequate for Discharge Goal: Individualized Educational  Video(s) Outcome: Adequate for Discharge   Problem: Cardiac: Goal: Ability to maintain an adequate cardiac output will improve Outcome: Adequate for Discharge   Problem: Health Behavior/Discharge Planning: Goal: Ability to identify and utilize available resources and services will improve Outcome: Adequate for Discharge Goal: Ability to manage health-related needs will improve Outcome: Adequate for Discharge   Problem: Fluid Volume: Goal: Ability to achieve a balanced intake and output will improve Outcome: Adequate for Discharge   Problem: Metabolic: Goal: Ability to maintain appropriate glucose levels will improve Outcome: Adequate for Discharge   Problem: Education: Goal: Ability to describe self-care measures that may prevent or decrease complications (Diabetes Survival Skills Education) will improve Outcome: Adequate for Discharge Goal: Individualized Educational Video(s) Outcome: Adequate for Discharge   Problem: Coping: Goal: Ability to adjust to condition or change in health will improve Outcome: Adequate for Discharge   Problem: Fluid Volume: Goal: Ability to maintain a balanced intake and output will improve Outcome: Adequate for Discharge   Problem: Health Behavior/Discharge Planning: Goal: Ability to identify and utilize available resources and services will improve Outcome: Adequate for Discharge Goal: Ability to manage health-related needs will improve Outcome: Adequate for Discharge   Problem: Metabolic: Goal: Ability to maintain appropriate glucose levels will improve Outcome: Adequate for Discharge   Problem: Nutritional: Goal: Maintenance of adequate nutrition will improve Outcome: Adequate for Discharge Goal: Progress toward achieving an optimal weight will improve Outcome: Adequate for Discharge   Problem: Skin Integrity: Goal: Risk for impaired skin integrity will decrease Outcome: Adequate for Discharge   Problem: Tissue Perfusion: Goal:  Adequacy of tissue perfusion will improve Outcome: Adequate for Discharge  Pt A&OX4, BP 136/74 (BP Location: Right Arm)   Pulse 93   Temp 98.1 F (36.7 C) (Oral)   Resp 15   Ht 5\' 3"  (1.6 m)   Wt 80 kg   SpO2 100%   BMI 31.24 kg/m  AVS printed and reviewed. No follow up questions. IV removed, belongings returned. Tele removed.  No further needs voiced at this time. Patient picked up by daughter. Reva Bores 09/16/23 10:50 AM

## 2023-09-18 LAB — CULTURE, BLOOD (ROUTINE X 2)
Culture: NO GROWTH
Culture: NO GROWTH
Special Requests: ADEQUATE
Special Requests: ADEQUATE

## 2023-09-19 ENCOUNTER — Other Ambulatory Visit: Payer: Self-pay

## 2023-09-21 ENCOUNTER — Encounter: Payer: MEDICAID | Attending: Internal Medicine | Admitting: Dietician

## 2023-09-21 ENCOUNTER — Encounter: Payer: Self-pay | Admitting: Dietician

## 2023-09-21 VITALS — Ht 62.0 in | Wt 178.2 lb

## 2023-09-21 DIAGNOSIS — N189 Chronic kidney disease, unspecified: Secondary | ICD-10-CM | POA: Diagnosis not present

## 2023-09-21 DIAGNOSIS — E1122 Type 2 diabetes mellitus with diabetic chronic kidney disease: Secondary | ICD-10-CM | POA: Diagnosis present

## 2023-09-21 DIAGNOSIS — Z713 Dietary counseling and surveillance: Secondary | ICD-10-CM | POA: Insufficient documentation

## 2023-09-21 DIAGNOSIS — Z794 Long term (current) use of insulin: Secondary | ICD-10-CM | POA: Diagnosis not present

## 2023-09-21 NOTE — Patient Instructions (Signed)
 Start going to Narcotics Anonymous meetings, or other support group Make sure to keep taking insulin as prescribed Continue to eat a balanced meal or snack every 3-5 hours during the day.  Great job including exercise regularly, keep it up!

## 2023-09-21 NOTE — Progress Notes (Signed)
 Diabetes Self-Management Education  Visit Type: First/Initial  Appt. Start Time: 1015 Appt. End Time: 1125  09/21/2023  Pamela Lane, identified by name and date of birth, is a 63 y.o. female with a diagnosis of Diabetes: Type 2.   ASSESSMENT  Height 5\' 2"  (1.575 m), weight 178 lb 3.2 oz (80.8 kg). Body mass index is 32.59 kg/m.   Diabetes Self-Management Education - 09/21/23 1036       Visit Information   Visit Type First/Initial      Initial Visit   Diabetes Type Type 2    Date Diagnosed 2005    Are you currently following a meal plan? No    Are you taking your medications as prescribed? Yes      Health Coping   How would you rate your overall health? Fair      Psychosocial Assessment   Patient Belief/Attitude about Diabetes Afraid    Special Needs None    Learning Readiness Ready    How often do you need to have someone help you when you read instructions, pamphlets, or other written materials from your doctor or pharmacy? 1 - Never    What is the last grade level you completed in school? 9      Pre-Education Assessment   Patient understands the diabetes disease and treatment process. Needs Review    Patient understands incorporating nutritional management into lifestyle. Needs Review    Patient undertands incorporating physical activity into lifestyle. Comprehends key points    Patient understands using medications safely. Needs Review    Patient understands monitoring blood glucose, interpreting and using results Comprehends key points    Patient understands prevention, detection, and treatment of acute complications. Needs Instruction    Patient understands prevention, detection, and treatment of chronic complications. Needs Instruction    Patient understands how to develop strategies to address psychosocial issues. Needs Instruction    Patient understands how to develop strategies to promote health/change behavior. Needs Review      Complications   Last  HgB A1C per patient/outside source 15.5 %   Lane admission for ketoacidosis   How often do you check your blood sugar? 3-4 times/day    Fasting Blood glucose range (mg/dL) >960;454-098;119-147;82-956   3/12 273; fasting and pre-prandial reading ranging 88->200   Postprandial Blood glucose range (mg/dL) 21-308;657-846;962-952;>841    Number of hyperglycemic episodes ( >200mg /dL): Weekly    Have you had a dilated eye exam in the past 12 months? No    Have you had a dental exam in the past 12 months? No    Are you checking your feet? Yes    How many days per week are you checking your feet? 7      Dietary Intake   Breakfast boiled/ scrambled egg, toast; sometimes with bacon;    Snack (morning) none recently    Lunch sandwich with ham or bologna + fruit ie orange    Snack (afternoon) none recently; occ 1/2 peanut butter sandwich with banana    Dinner boyfriend usually cooks chicken/ pot roast + brown rice + veg    Snack (evening) none    Beverage(s) water, some with sugar free flavoring, rarely coffee, occ chocolate milk      Activity / Exercise   Activity / Exercise Type Light (walking / raking leaves)    How many days per week do you exercise? 7    How many minutes per day do you exercise? 60    Total minutes  per week of exercise 420      Patient Education   Previous Diabetes Education No    Disease Pathophysiology Definition of diabetes, type 1 and 2, and the diagnosis of diabetes    Healthy Eating Role of diet in the treatment of diabetes and the relationship between the three main macronutrients and blood glucose level;Plate Method;Reviewed blood glucose goals for pre and post meals and how to evaluate the patients' food intake on their blood glucose level.;Meal timing in regards to the patients' current diabetes medication.;Meal options for control of blood glucose level and chronic complications.    Being Active Role of exercise on diabetes management, blood pressure control and  cardiac health.    Monitoring Purpose and frequency of SMBG.;Taught/discussed recording of test results and interpretation of SMBG.;Identified appropriate SMBG and/or A1C goals.    Acute complications Taught prevention, symptoms, and  treatment of hypoglycemia - the 15 rule.    Diabetes Stress and Support Other (comment)   support group for drug use with meetings close to patient's home   Lifestyle and Health Coping Lifestyle issues that need to be addressed for better diabetes care      Outcomes   Expected Outcomes Demonstrated interest in learning. Expect positive outcomes    Future DMSE 4-6 wks             Individualized Plan for Diabetes Self-Management Training:   Learning Objective:  Patient will have a greater understanding of diabetes self-management. Patient education plan is to attend individual and/or group sessions per assessed needs and concerns.   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 Discussed this as first step in improving self care and diabetes management. Provided information for Narcotics Anonymous meeting near patient's home.   Plan:   Patient Instructions  Start going to Narcotics Anonymous meetings, or other support group Make sure to keep taking insulin as prescribed Continue to eat a balanced meal or snack every 3-5 hours during the day.  Great job including exercise regularly, keep it up!   Expected Outcomes:  Demonstrated interest in learning. Expect positive outcomes  Education material provided: How to Thrive ADA Diabetes management book; plate planner with food lists, NA brochure  If problems or questions, patient to contact team via:  Phone or patient portal  Future DSME appointment: 4-6 wks

## 2023-10-12 ENCOUNTER — Emergency Department: Payer: MEDICAID

## 2023-10-12 ENCOUNTER — Other Ambulatory Visit: Payer: Self-pay

## 2023-10-12 ENCOUNTER — Inpatient Hospital Stay
Admission: EM | Admit: 2023-10-12 | Discharge: 2023-10-14 | DRG: 637 | Disposition: A | Discharge status: Home / Self Care | Payer: MEDICAID | Attending: Family Medicine | Admitting: Family Medicine

## 2023-10-12 DIAGNOSIS — Z1152 Encounter for screening for COVID-19: Secondary | ICD-10-CM

## 2023-10-12 DIAGNOSIS — J4521 Mild intermittent asthma with (acute) exacerbation: Secondary | ICD-10-CM | POA: Diagnosis present

## 2023-10-12 DIAGNOSIS — F419 Anxiety disorder, unspecified: Secondary | ICD-10-CM | POA: Diagnosis present

## 2023-10-12 DIAGNOSIS — E1110 Type 2 diabetes mellitus with ketoacidosis without coma: Principal | ICD-10-CM | POA: Diagnosis present

## 2023-10-12 DIAGNOSIS — F319 Bipolar disorder, unspecified: Secondary | ICD-10-CM | POA: Diagnosis present

## 2023-10-12 DIAGNOSIS — Z8619 Personal history of other infectious and parasitic diseases: Secondary | ICD-10-CM | POA: Diagnosis not present

## 2023-10-12 DIAGNOSIS — J9601 Acute respiratory failure with hypoxia: Secondary | ICD-10-CM | POA: Diagnosis present

## 2023-10-12 DIAGNOSIS — E1140 Type 2 diabetes mellitus with diabetic neuropathy, unspecified: Secondary | ICD-10-CM | POA: Diagnosis present

## 2023-10-12 DIAGNOSIS — N179 Acute kidney failure, unspecified: Secondary | ICD-10-CM | POA: Diagnosis present

## 2023-10-12 DIAGNOSIS — E785 Hyperlipidemia, unspecified: Secondary | ICD-10-CM | POA: Diagnosis present

## 2023-10-12 DIAGNOSIS — F17210 Nicotine dependence, cigarettes, uncomplicated: Secondary | ICD-10-CM | POA: Diagnosis present

## 2023-10-12 DIAGNOSIS — F1990 Other psychoactive substance use, unspecified, uncomplicated: Secondary | ICD-10-CM

## 2023-10-12 DIAGNOSIS — Z90710 Acquired absence of both cervix and uterus: Secondary | ICD-10-CM | POA: Diagnosis not present

## 2023-10-12 DIAGNOSIS — Z91128 Patient's intentional underdosing of medication regimen for other reason: Secondary | ICD-10-CM

## 2023-10-12 DIAGNOSIS — E66811 Obesity, class 1: Secondary | ICD-10-CM | POA: Diagnosis present

## 2023-10-12 DIAGNOSIS — Z8249 Family history of ischemic heart disease and other diseases of the circulatory system: Secondary | ICD-10-CM | POA: Diagnosis not present

## 2023-10-12 DIAGNOSIS — Z6834 Body mass index (BMI) 34.0-34.9, adult: Secondary | ICD-10-CM

## 2023-10-12 DIAGNOSIS — D72829 Elevated white blood cell count, unspecified: Secondary | ICD-10-CM | POA: Diagnosis present

## 2023-10-12 DIAGNOSIS — I129 Hypertensive chronic kidney disease with stage 1 through stage 4 chronic kidney disease, or unspecified chronic kidney disease: Secondary | ICD-10-CM | POA: Diagnosis present

## 2023-10-12 DIAGNOSIS — Z6831 Body mass index (BMI) 31.0-31.9, adult: Secondary | ICD-10-CM

## 2023-10-12 DIAGNOSIS — Z79899 Other long term (current) drug therapy: Secondary | ICD-10-CM

## 2023-10-12 DIAGNOSIS — Z91199 Patient's noncompliance with other medical treatment and regimen due to unspecified reason: Secondary | ICD-10-CM | POA: Diagnosis not present

## 2023-10-12 DIAGNOSIS — Z7984 Long term (current) use of oral hypoglycemic drugs: Secondary | ICD-10-CM

## 2023-10-12 DIAGNOSIS — Z794 Long term (current) use of insulin: Secondary | ICD-10-CM | POA: Diagnosis not present

## 2023-10-12 DIAGNOSIS — F1410 Cocaine abuse, uncomplicated: Secondary | ICD-10-CM | POA: Diagnosis present

## 2023-10-12 LAB — RESP PANEL BY RT-PCR (RSV, FLU A&B, COVID)  RVPGX2
Influenza A by PCR: NEGATIVE
Influenza B by PCR: NEGATIVE
Resp Syncytial Virus by PCR: NEGATIVE
SARS Coronavirus 2 by RT PCR: NEGATIVE

## 2023-10-12 LAB — BASIC METABOLIC PANEL WITH GFR
Anion gap: 21 — ABNORMAL HIGH (ref 5–15)
Anion gap: 12 (ref 5–15)
Anion gap: 6 (ref 5–15)
Anion gap: 6 (ref 5–15)
Anion gap: 5 (ref 5–15)
BUN: 36 mg/dL — ABNORMAL HIGH (ref 8–23)
BUN: 32 mg/dL — ABNORMAL HIGH (ref 8–23)
BUN: 27 mg/dL — ABNORMAL HIGH (ref 8–23)
BUN: 24 mg/dL — ABNORMAL HIGH (ref 8–23)
BUN: 21 mg/dL (ref 8–23)
CO2: 10 mmol/L — ABNORMAL LOW (ref 22–32)
CO2: 15 mmol/L — ABNORMAL LOW (ref 22–32)
CO2: 18 mmol/L — ABNORMAL LOW (ref 22–32)
CO2: 19 mmol/L — ABNORMAL LOW (ref 22–32)
CO2: 19 mmol/L — ABNORMAL LOW (ref 22–32)
Calcium: 9.4 mg/dL (ref 8.9–10.3)
Calcium: 8.9 mg/dL (ref 8.9–10.3)
Calcium: 8.7 mg/dL — ABNORMAL LOW (ref 8.9–10.3)
Calcium: 8.9 mg/dL (ref 8.9–10.3)
Calcium: 8.8 mg/dL — ABNORMAL LOW (ref 8.9–10.3)
Chloride: 104 mmol/L (ref 98–111)
Chloride: 108 mmol/L (ref 98–111)
Chloride: 111 mmol/L (ref 98–111)
Chloride: 111 mmol/L (ref 98–111)
Chloride: 111 mmol/L (ref 98–111)
Creatinine, Ser: 1.76 mg/dL — ABNORMAL HIGH (ref 0.44–1.00)
Creatinine, Ser: 1.27 mg/dL — ABNORMAL HIGH (ref 0.44–1.00)
Creatinine, Ser: 0.98 mg/dL (ref 0.44–1.00)
Creatinine, Ser: 0.92 mg/dL (ref 0.44–1.00)
Creatinine, Ser: 0.87 mg/dL (ref 0.44–1.00)
GFR, Estimated: 32 mL/min — ABNORMAL LOW (ref >60)
GFR, Estimated: 48 mL/min — ABNORMAL LOW (ref >60)
GFR, Estimated: >60 mL/min (ref >60)
GFR, Estimated: >60 mL/min (ref >60)
GFR, Estimated: >60 mL/min (ref >60)
Glucose, Bld: 381 mg/dL — ABNORMAL HIGH (ref 70–99)
Glucose, Bld: 237 mg/dL — ABNORMAL HIGH (ref 70–99)
Glucose, Bld: 273 mg/dL — ABNORMAL HIGH (ref 70–99)
Glucose, Bld: 153 mg/dL — ABNORMAL HIGH (ref 70–99)
Glucose, Bld: 183 mg/dL — ABNORMAL HIGH (ref 70–99)
Potassium: 4.3 mmol/L (ref 3.5–5.1)
Potassium: 3.8 mmol/L (ref 3.5–5.1)
Potassium: 4 mmol/L (ref 3.5–5.1)
Potassium: 3.7 mmol/L (ref 3.5–5.1)
Potassium: 3.2 mmol/L — ABNORMAL LOW (ref 3.5–5.1)
Sodium: 135 mmol/L (ref 135–145)
Sodium: 135 mmol/L (ref 135–145)
Sodium: 135 mmol/L (ref 135–145)
Sodium: 136 mmol/L (ref 135–145)
Sodium: 135 mmol/L (ref 135–145)

## 2023-10-12 LAB — CBC WITH DIFFERENTIAL/PLATELET
Abs Immature Granulocytes: 0.17 10*3/uL — ABNORMAL HIGH (ref 0.00–0.07)
Basophils Absolute: 0.1 10*3/uL (ref 0.0–0.1)
Basophils Relative: 0 %
Eosinophils Absolute: 0 10*3/uL (ref 0.0–0.5)
Eosinophils Relative: 0 %
HCT: 44.5 % (ref 36.0–46.0)
Hemoglobin: 14.6 g/dL (ref 12.0–15.0)
Immature Granulocytes: 1 %
Lymphocytes Relative: 12 %
Lymphs Abs: 2 10*3/uL (ref 0.7–4.0)
MCH: 30 pg (ref 26.0–34.0)
MCHC: 32.8 g/dL (ref 30.0–36.0)
MCV: 91.4 fL (ref 80.0–100.0)
Monocytes Absolute: 1.1 10*3/uL — ABNORMAL HIGH (ref 0.1–1.0)
Monocytes Relative: 7 %
Neutro Abs: 13.7 10*3/uL — ABNORMAL HIGH (ref 1.7–7.7)
Neutrophils Relative %: 80 %
Platelets: 348 10*3/uL (ref 150–400)
RBC: 4.87 MIL/uL (ref 3.87–5.11)
RDW: 12.8 % (ref 11.5–15.5)
WBC: 17 10*3/uL — ABNORMAL HIGH (ref 4.0–10.5)
nRBC: 0.1 % (ref 0.0–0.2)

## 2023-10-12 LAB — BLOOD GAS, VENOUS
Acid-base deficit: 25.7 mmol/L — ABNORMAL HIGH (ref 0.0–2.0)
Acid-base deficit: 16.4 mmol/L — ABNORMAL HIGH (ref 0.0–2.0)
Bicarbonate: 5.3 mmol/L — ABNORMAL LOW (ref 20.0–28.0)
Bicarbonate: 10.3 mmol/L — ABNORMAL LOW (ref 20.0–28.0)
O2 Saturation: 78.3 %
O2 Saturation: 69.5 %
Patient temperature: 37
Patient temperature: 37
pCO2, Ven: 24 mmHg — ABNORMAL LOW (ref 44–60)
pCO2, Ven: 27 mmHg — ABNORMAL LOW (ref 44–60)
pH, Ven: 6.95 — CL (ref 7.25–7.43)
pH, Ven: 7.19 — CL (ref 7.25–7.43)
pO2, Ven: 51 mmHg — ABNORMAL HIGH (ref 32–45)
pO2, Ven: 37 mmHg (ref 32–45)

## 2023-10-12 LAB — COMPREHENSIVE METABOLIC PANEL WITH GFR
ALT: 13 U/L (ref 0–44)
AST: 13 U/L — ABNORMAL LOW (ref 15–41)
Albumin: 4.6 g/dL (ref 3.5–5.0)
Alkaline Phosphatase: 119 U/L (ref 38–126)
BUN: 41 mg/dL — ABNORMAL HIGH (ref 8–23)
CO2: <7 mmol/L — ABNORMAL LOW (ref 22–32)
Calcium: 9 mg/dL (ref 8.9–10.3)
Chloride: 95 mmol/L — ABNORMAL LOW (ref 98–111)
Creatinine, Ser: 2.06 mg/dL — ABNORMAL HIGH (ref 0.44–1.00)
GFR, Estimated: 27 mL/min — ABNORMAL LOW (ref >60)
Glucose, Bld: 769 mg/dL (ref 70–99)
Potassium: 4.9 mmol/L (ref 3.5–5.1)
Sodium: 127 mmol/L — ABNORMAL LOW (ref 135–145)
Total Bilirubin: 1.6 mg/dL — ABNORMAL HIGH (ref 0.0–1.2)
Total Protein: 8.4 g/dL — ABNORMAL HIGH (ref 6.5–8.1)

## 2023-10-12 LAB — URINALYSIS, W/ REFLEX TO CULTURE (INFECTION SUSPECTED)
Bacteria, UA: NONE SEEN
Bilirubin Urine: NEGATIVE
Glucose, UA: >=500 mg/dL — AB
Ketones, ur: 20 mg/dL — AB
Leukocytes,Ua: NEGATIVE
Nitrite: NEGATIVE
Protein, ur: 30 mg/dL — AB
Specific Gravity, Urine: 1.017 (ref 1.005–1.030)
pH: 6 (ref 5.0–8.0)

## 2023-10-12 LAB — LACTIC ACID, PLASMA
Lactic Acid, Venous: 3 mmol/L (ref 0.5–1.9)
Lactic Acid, Venous: 3.5 mmol/L (ref 0.5–1.9)
Lactic Acid, Venous: 3.9 mmol/L (ref 0.5–1.9)
Lactic Acid, Venous: 3.6 mmol/L (ref 0.5–1.9)
Lactic Acid, Venous: 1.5 mmol/L (ref 0.5–1.9)

## 2023-10-12 LAB — TROPONIN I (HIGH SENSITIVITY): Troponin I (High Sensitivity): 6 ng/L (ref <18)

## 2023-10-12 LAB — URINE DRUG SCREEN, QUALITATIVE (ARMC ONLY)
Amphetamines, Ur Screen: NOT DETECTED
Barbiturates, Ur Screen: NOT DETECTED
Benzodiazepine, Ur Scrn: NOT DETECTED
Cannabinoid 50 Ng, Ur ~~LOC~~: NOT DETECTED
Cocaine Metabolite,Ur ~~LOC~~: POSITIVE — AB
MDMA (Ecstasy)Ur Screen: NOT DETECTED
Methadone Scn, Ur: NOT DETECTED
Opiate, Ur Screen: NOT DETECTED
Phencyclidine (PCP) Ur S: NOT DETECTED
Tricyclic, Ur Screen: NOT DETECTED

## 2023-10-12 LAB — GLUCOSE, CAPILLARY
Glucose-Capillary: 287 mg/dL — ABNORMAL HIGH (ref 70–99)
Glucose-Capillary: 316 mg/dL — ABNORMAL HIGH (ref 70–99)
Glucose-Capillary: 248 mg/dL — ABNORMAL HIGH (ref 70–99)
Glucose-Capillary: 200 mg/dL — ABNORMAL HIGH (ref 70–99)
Glucose-Capillary: 201 mg/dL — ABNORMAL HIGH (ref 70–99)
Glucose-Capillary: 170 mg/dL — ABNORMAL HIGH (ref 70–99)
Glucose-Capillary: 140 mg/dL — ABNORMAL HIGH (ref 70–99)
Glucose-Capillary: 149 mg/dL — ABNORMAL HIGH (ref 70–99)
Glucose-Capillary: 167 mg/dL — ABNORMAL HIGH (ref 70–99)

## 2023-10-12 LAB — MRSA NEXT GEN BY PCR, NASAL
MRSA by PCR Next Gen: NOT DETECTED
MRSA by PCR Next Gen: NOT DETECTED

## 2023-10-12 LAB — CBG MONITORING, ED
Glucose-Capillary: >600 mg/dL (ref 70–99)
Glucose-Capillary: >600 mg/dL (ref 70–99)
Glucose-Capillary: 573 mg/dL (ref 70–99)
Glucose-Capillary: >600 mg/dL (ref 70–99)
Glucose-Capillary: 388 mg/dL — ABNORMAL HIGH (ref 70–99)
Glucose-Capillary: 282 mg/dL — ABNORMAL HIGH (ref 70–99)
Glucose-Capillary: 241 mg/dL — ABNORMAL HIGH (ref 70–99)
Glucose-Capillary: 229 mg/dL — ABNORMAL HIGH (ref 70–99)
Glucose-Capillary: 297 mg/dL — ABNORMAL HIGH (ref 70–99)
Glucose-Capillary: 282 mg/dL — ABNORMAL HIGH (ref 70–99)

## 2023-10-12 LAB — BETA-HYDROXYBUTYRIC ACID
Beta-Hydroxybutyric Acid: >8 mmol/L — ABNORMAL HIGH (ref 0.05–0.27)
Beta-Hydroxybutyric Acid: 6.29 mmol/L — ABNORMAL HIGH (ref 0.05–0.27)
Beta-Hydroxybutyric Acid: 3.11 mmol/L — ABNORMAL HIGH (ref 0.05–0.27)
Beta-Hydroxybutyric Acid: 0.39 mmol/L — ABNORMAL HIGH (ref 0.05–0.27)
Beta-Hydroxybutyric Acid: 0.25 mmol/L (ref 0.05–0.27)

## 2023-10-12 LAB — BRAIN NATRIURETIC PEPTIDE: B Natriuretic Peptide: 75.3 pg/mL (ref 0.0–100.0)

## 2023-10-12 LAB — PROTIME-INR
INR: 1.2 (ref 0.8–1.2)
Prothrombin Time: 15.4 s — ABNORMAL HIGH (ref 11.4–15.2)

## 2023-10-12 LAB — MAGNESIUM: Magnesium: 2.4 mg/dL (ref 1.7–2.4)

## 2023-10-12 LAB — PHOSPHORUS: Phosphorus: 2.8 mg/dL (ref 2.5–4.6)

## 2023-10-12 LAB — APTT: aPTT: 27 s (ref 24–36)

## 2023-10-12 LAB — LIPASE, BLOOD: Lipase: 28 U/L (ref 11–51)

## 2023-10-12 LAB — PROCALCITONIN: Procalcitonin: 0.11 ng/mL

## 2023-10-12 LAB — HEMOGLOBIN A1C
Hgb A1c MFr Bld: 14.3 % — ABNORMAL HIGH (ref 4.8–5.6)
Mean Plasma Glucose: 363.71 mg/dL

## 2023-10-12 MED ORDER — VANCOMYCIN HCL IN DEXTROSE 1-5 GM/200ML-% IV SOLN
1000.0000 mg | Freq: Once | INTRAVENOUS | Status: AC
  Administered 2023-10-12: 1000 mg via INTRAVENOUS
  Filled 2023-10-12: qty 200

## 2023-10-12 MED ORDER — KETOROLAC TROMETHAMINE 30 MG/ML IJ SOLN
30.0000 mg | Freq: Once | INTRAMUSCULAR | Status: AC
Start: 2023-10-12 — End: 2023-10-12
  Administered 2023-10-12: 30 mg via INTRAVENOUS
  Filled 2023-10-12: qty 1

## 2023-10-12 MED ORDER — STERILE WATER FOR INJECTION IV SOLN
INTRAVENOUS | Status: DC
  Filled 2023-10-12: qty 150
  Filled 2023-10-12: qty 1000

## 2023-10-12 MED ORDER — DOCUSATE SODIUM 100 MG PO CAPS: 100.0000 mg | ORAL_CAPSULE | Freq: Two times a day (BID) | ORAL | Status: DC | PRN

## 2023-10-12 MED ORDER — SODIUM CHLORIDE 0.9 % IV SOLN: 2.0000 g | INTRAVENOUS | Status: DC

## 2023-10-12 MED ORDER — LACTATED RINGERS IV BOLUS
1000.0000 mL | Freq: Once | INTRAVENOUS | Status: AC
  Administered 2023-10-12: 1000 mL via INTRAVENOUS

## 2023-10-12 MED ORDER — SODIUM CHLORIDE 0.9 % IV SOLN
2.0000 g | Freq: Once | INTRAVENOUS | Status: AC
  Administered 2023-10-12: 2 g via INTRAVENOUS
  Filled 2023-10-12: qty 12.5

## 2023-10-12 MED ORDER — POTASSIUM CHLORIDE CRYS ER 20 MEQ PO TBCR
40.0000 meq | EXTENDED_RELEASE_TABLET | Freq: Once | ORAL | Status: AC
  Administered 2023-10-12: 40 meq via ORAL
  Filled 2023-10-12: qty 2

## 2023-10-12 MED ORDER — CHLORHEXIDINE GLUCONATE CLOTH 2 % EX PADS
6.0000 | MEDICATED_PAD | Freq: Every day | CUTANEOUS | Status: DC
  Administered 2023-10-12 – 2023-10-14 (×3): 6 via TOPICAL

## 2023-10-12 MED ORDER — ATORVASTATIN CALCIUM 20 MG PO TABS
40.0000 mg | ORAL_TABLET | Freq: Every day | ORAL | Status: DC
Start: 2023-10-12 — End: 2023-10-14
  Administered 2023-10-12 – 2023-10-13 (×2): 40 mg via ORAL
  Filled 2023-10-12 (×2): qty 2

## 2023-10-12 MED ORDER — VANCOMYCIN HCL 750 MG/150ML IV SOLN
750.0000 mg | INTRAVENOUS | Status: DC
Start: 2023-10-13 — End: 2023-10-12

## 2023-10-12 MED ORDER — VANCOMYCIN HCL IN DEXTROSE 1-5 GM/200ML-% IV SOLN
1000.0000 mg | INTRAVENOUS | Status: DC
  Administered 2023-10-13: 1000 mg via INTRAVENOUS
  Filled 2023-10-12: qty 200

## 2023-10-12 MED ORDER — POLYETHYLENE GLYCOL 3350 17 G PO PACK: 17.0000 g | PACK | Freq: Every day | ORAL | Status: DC | PRN

## 2023-10-12 MED ORDER — DEXTROSE 50 % IV SOLN: 0.0000 mL | INTRAVENOUS | Status: DC | PRN

## 2023-10-12 MED ORDER — BUPROPION HCL ER (SR) 150 MG PO TB12
150.0000 mg | ORAL_TABLET | Freq: Two times a day (BID) | ORAL | Status: DC
  Administered 2023-10-12 – 2023-10-14 (×5): 150 mg via ORAL
  Filled 2023-10-12 (×7): qty 1

## 2023-10-12 MED ORDER — PANTOPRAZOLE SODIUM 40 MG PO TBEC
40.0000 mg | DELAYED_RELEASE_TABLET | Freq: Every day | ORAL | Status: DC
  Administered 2023-10-12 – 2023-10-14 (×3): 40 mg via ORAL
  Filled 2023-10-12 (×3): qty 1

## 2023-10-12 MED ORDER — LACTATED RINGERS IV BOLUS (SEPSIS)
1000.0000 mL | Freq: Once | INTRAVENOUS | Status: AC
  Administered 2023-10-12: 1000 mL via INTRAVENOUS

## 2023-10-12 MED ORDER — POTASSIUM CHLORIDE 10 MEQ/100ML IV SOLN
10.0000 meq | INTRAVENOUS | Status: AC
  Administered 2023-10-12 (×2): 10 meq via INTRAVENOUS
  Filled 2023-10-12: qty 100

## 2023-10-12 MED ORDER — LACTATED RINGERS IV SOLN: INTRAVENOUS | Status: DC

## 2023-10-12 MED ORDER — DEXTROSE IN LACTATED RINGERS 5 % IV SOLN: INTRAVENOUS | Status: DC

## 2023-10-12 MED ORDER — METHYLPREDNISOLONE SODIUM SUCC 125 MG IJ SOLR
125.0000 mg | Freq: Once | INTRAMUSCULAR | Status: AC
  Administered 2023-10-12: 125 mg via INTRAVENOUS
  Filled 2023-10-12: qty 2

## 2023-10-12 MED ORDER — ALBUTEROL SULFATE (2.5 MG/3ML) 0.083% IN NEBU
5.0000 mg | INHALATION_SOLUTION | Freq: Once | RESPIRATORY_TRACT | Status: AC
  Administered 2023-10-12: 5 mg via RESPIRATORY_TRACT
  Filled 2023-10-12: qty 6

## 2023-10-12 MED ORDER — SODIUM BICARBONATE 8.4 % IV SOLN
100.0000 meq | Freq: Once | INTRAVENOUS | Status: AC
  Administered 2023-10-12: 100 meq via INTRAVENOUS
  Filled 2023-10-12: qty 50

## 2023-10-12 MED ORDER — SODIUM CHLORIDE 0.9 % IV SOLN
2.0000 g | Freq: Two times a day (BID) | INTRAVENOUS | Status: DC
  Administered 2023-10-12 – 2023-10-13 (×2): 2 g via INTRAVENOUS
  Filled 2023-10-12 (×3): qty 12.5

## 2023-10-12 MED ORDER — HEPARIN SODIUM (PORCINE) 5000 UNIT/ML IJ SOLN
5000.0000 [IU] | Freq: Three times a day (TID) | INTRAMUSCULAR | Status: DC
  Administered 2023-10-12 – 2023-10-14 (×8): 5000 [IU] via SUBCUTANEOUS
  Filled 2023-10-12 (×8): qty 1

## 2023-10-12 MED ORDER — IPRATROPIUM-ALBUTEROL 0.5-2.5 (3) MG/3ML IN SOLN: 3.0000 mL | RESPIRATORY_TRACT | Status: DC | PRN

## 2023-10-12 MED ORDER — IPRATROPIUM-ALBUTEROL 0.5-2.5 (3) MG/3ML IN SOLN: 3.0000 mL | Freq: Four times a day (QID) | RESPIRATORY_TRACT | Status: DC

## 2023-10-12 MED ORDER — INSULIN REGULAR(HUMAN) IN NACL 100-0.9 UT/100ML-% IV SOLN
INTRAVENOUS | Status: DC
  Administered 2023-10-12: 13 [IU]/h via INTRAVENOUS
  Administered 2023-10-12: 5.5 [IU]/h via INTRAVENOUS
  Filled 2023-10-12 (×2): qty 100

## 2023-10-12 MED ORDER — DILTIAZEM HCL ER COATED BEADS 120 MG PO CP24
120.0000 mg | ORAL_CAPSULE | Freq: Every day | ORAL | Status: DC
Start: 2023-10-12 — End: 2023-10-14
  Administered 2023-10-12 – 2023-10-14 (×3): 120 mg via ORAL
  Filled 2023-10-12 (×3): qty 1

## 2023-10-12 MED ORDER — POTASSIUM CHLORIDE 10 MEQ/100ML IV SOLN
10.0000 meq | INTRAVENOUS | Status: AC
  Administered 2023-10-12 – 2023-10-13 (×4): 10 meq via INTRAVENOUS
  Filled 2023-10-12 (×4): qty 100

## 2023-10-12 NOTE — ED Notes (Signed)
 Per ENDOtool pt to remain at 10Units/hr

## 2023-10-12 NOTE — Progress Notes (Addendum)
 Pharmacy Antibiotic Note  Pamela Lane is a 63 y.o. female admitted on 10/12/2023 with sepsis.  Pharmacy has been consulted for Cefepime & Vancomycin dosing.  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  Crcl 33.5 ml/min  Pt given Vancomycin 2000 mg total for Loading dose. - will adjust to Vancomycin 1000 mg IV Q 24 hrs. Goal AUC 400-550. Expected AUC: 474 Cmin 13.1 SCr used: 1.27, Vd used: 0.72  Pharmacy will continue to follow and will adjust abx dosing as warranted.  Temp (24hrs), Avg:98 F (36.7 C), Min:97.8 F (36.6 C), Max:98.2 F (36.8 C)   Recent Labs  Lab 10/12/23 0215 10/12/23 0347 10/12/23 0706  WBC 17.0*  --   --   CREATININE 2.06*  --  1.76*  LATICACIDVEN 3.0* 3.5* 3.9*    Estimated Creatinine Clearance: 33.5 mL/min (A) (by C-G formula based on SCr of 1.76 mg/dL (H)).    No Known Allergies  Antimicrobials this admission: 4/02 Cefepime >>  4/02 Vancomycin >>  Microbiology results: 4/02 BCx: NGTD 4/02 MRSA:   Thank you for allowing pharmacy to be a part of this patient's care.  Bettey Costa, PharmD Clinical Pharmacist 10/12/2023 12:10 PM

## 2023-10-12 NOTE — H&P (Signed)
 NAME:  Pamela Lane, MRN:  [id number], DOB:  [birth year], LOS: 0 ADMISSION DATE:  10/12/2023, CONSULTATION DATE:  10/12/23 REFERRING MD:  Dr. Elesa Massed, CHIEF COMPLAINT:  Respiratory Distress   History of Present Illness:  63 yo F presenting to [facility] ED from home in respiratory distress.  History obtained per chart review and patient bedside report. Patient was in her normal state of health until 10/09/23 when she started feeling "rough". She endorses dyspnea, chest discomfort with coughing, chills, headache, fatigue, poor PO intake, generalized abdominal pain/ nausea/ non bloody bilious emesis and diarrhea. She denies dysuria. She reports taking all her medication as prescribed, and admits to using cocaine on 10/11/23. She denies ETOH use or any other recreational substance use recently.  EMS reported patient hypoxic at 80% on room air, administered albuterol and duo neb x 2.  ED course: Upon arrival patient alert and responsive, tachycardic and tachypneic with otherwise stable vitals on room air. Labs consistent with DKA, as well as lactic acidosis and leukocytosis. Imaging unrevealing. Medications given: 3 L LR bolus, toradol, albuterol, cefepime/ flagyl/ vancomycin, solu medrol, K+ supplementation Initial Vitals: 97.8, 25, 123, 137/74 & 100% on RA Significant labs: (Labs/ Imaging personally reviewed) I, Cheryll Cockayne Rust-Chester, AGACNP-BC, personally viewed and interpreted this ECG. EKG Interpretation: Date: 10/12/23, EKG Time: 02:12, Rate: 127, Rhythm: ST, QRS Axis:  normal, Intervals: prolonged Qtc, ST/T Wave abnormalities: none, Narrative Interpretation: ST with prolonged Qtc Chemistry: Na+: 127, K+: 4.9, BUN/Cr.: 41/ 2.06, Serum CO2/ AG: <7/ not calc., T.Bili: 1.6 Hematology: WBC: 17, Hgb: 14.6,  Troponin: 6, BNP: 75.3, Lactic/ PCT: 3.0 > 3.5/ pending, COVID-19 & Influenza A/B: negative VBG: 6.95/ 24/ 51/ 5.3  CXR 10/12/23: no active disease CT head wo contrast 10/12/23: normal head CT  PCCM  consulted for admission due to severe DKA and severe metabolic acidosis.  Pertinent  Medical History  T2DM with recurrent DKA HTN Asthma polysubstance abuse Anxiety & Depression Bipolar disorder CKD Stage 3 SVT C.Diff colitis Significant Hospital Events: Including procedures, antibiotic start and stop dates in addition to other pertinent events   10/12/23: Admit to ICU with recurrent DKA and severe metabolic acidosis requiring bicarbonate supplementation as well as AKI on CKD and suspected sepsis.  Interim History / Subjective:  Patient alert and responsive. Plan of care discussed, all questions and concerns answered at this time.  Objective   Blood pressure 137/74, pulse (!) 123, temperature 97.8 F (36.6 C), resp. rate (!) 25, height 5\' 2"  (1.575 m), weight 84.8 kg, SpO2 100%.       No intake or output data in the 24 hours ending 10/12/23 0403 Filed Weights   10/12/23 0158  Weight: 84.8 kg    Examination: General: Adult female, acutely ill, lying in bed, NAD HEENT: MM pink/moist, anicteric, atraumatic, neck supple Neuro: A&O x 4, able to follow commands, PERRL +3, MAE CV: s1s2 RRR, ST on monitor, no r/m/g Pulm: Regular, non labored on RA , breath sounds clear-BUL & diminished-BLL GI: soft, rounded, non tender, bs x 4 Skin: no rashes/lesions noted Extremities: warm/dry, pulses + 2 R/P, no edema noted  Resolved Hospital Problem list     Assessment & Plan:  Severe Diabetic ketoacidosis  Severe Metabolic Acidosis PMHx: T2DM with recurrent DKA - DKA protocol initiated - sodium bicarbonate infusion ordered - Aggressive IV hydration, when blood glucose falls below 250 add D5 to IV fluids - Insulin drip ordered per Endo tool protocol - Strict I/O's: alert provider if UOP < 0.5  mL/kg/hr - Q 4 BMP, Q 8 beta-hydroxy. closely monitor potassium, replace electrolytes PRN - monitor blood glucose every 1 hour, per Endo tool protocol - trend serum CO2/ AG/ Lactic, VBG, f/u A1C -  Diabetes coordinator consult - Resume home insulin regiment once appropriate  Suspected Sepsis due to unknown etiology Initial interventions/workup included: 3 L of NS/LR & Cefepime/ Vancomycin - Supplemental oxygen as needed, to maintain SpO2 > 90% - f/u cultures, trend lactic/ PCT - Daily CBC, monitor WBC/ fever curve - IV antibiotics: cefepime & vancomycin  Acute Kidney Injury  Pseudo-hyponatremia Baseline Cr: 0.7-0.9, Cr on admission: 2.06 - Strict I/O's: alert provider if UOP < 0.5 mL/kg/hr -  IVF hydration per DKA protocol as above - Daily BMP, replace electrolytes PRN - Avoid nephrotoxic agents as able, ensure adequate renal perfusion - consider nephrology consultation PRN  - consider renal US  Acute Hypoxic Respiratory Failure secondary to Asthma- improved PMHx: Asthma - Supplemental O2 to maintain SpO2 > 90% - Ensure adequate pulmonary hygiene  - bronchodilators PRN, consider additional solu medrol PRN  HTN HLD PMHx: SVT - continuous cardiac monitoring - continue outpatient Diltiazem - PRN hydralazine for SBP > 170 - maintain MAP > 65  Polysubstance Abuse Bipolar disorder Anxiety & Depression - cessation counseling provided - continue outpatient Wellbutrin  Best Practice (right click and "Reselect all SmartList Selections" daily)  Diet/type: NPO w/ oral meds DVT prophylaxis prophylactic heparin  Pressure ulcer(s): N/A GI prophylaxis: N/A Lines: N/A Foley:  N/A Code Status:  full code Last date of multidisciplinary goals of care discussion [10/12/23]  Labs   CBC: Recent Labs  Lab 10/12/23 0215  WBC 17.0*  NEUTROABS 13.7*  HGB 14.6  HCT 44.5  MCV 91.4  PLT 348    Basic Metabolic Panel: Recent Labs  Lab 10/12/23 0215  NA 127*  K 4.9  CL 95*  CO2 <7*  GLUCOSE 769*  BUN 41*  CREATININE 2.06*  CALCIUM 9.0  MG 2.4   GFR: Estimated Creatinine Clearance: 28.6 mL/min (A) (by C-G formula based on SCr of 2.06 mg/dL (H)). Recent Labs  Lab  10/12/23 0215  WBC 17.0*  LATICACIDVEN 3.0*    Liver Function Tests: Recent Labs  Lab 10/12/23 0215  AST 13*  ALT 13  ALKPHOS 119  BILITOT 1.6*  PROT 8.4*  ALBUMIN 4.6   No results for input(s): "LIPASE", "AMYLASE" in the last 168 hours. No results for input(s): "AMMONIA" in the last 168 hours.  ABG    Component Value Date/Time   PHART 7.34 (L) 10/08/2020 0805   PCO2ART 40 10/08/2020 0805   PO2ART 78 (L) 10/08/2020 0805   HCO3 5.3 (L) 10/12/2023 0215   TCO2 15 01/12/2016 2315   ACIDBASEDEF 25.7 (H) 10/12/2023 0215   O2SAT 78.3 10/12/2023 0215     Coagulation Profile: Recent Labs  Lab 10/12/23 0215  INR 1.2    Cardiac Enzymes: No results for input(s): "CKTOTAL", "CKMB", "CKMBINDEX", "TROPONINI" in the last 168 hours.  HbA1C: Hgb A1c MFr Bld  Date/Time Value Ref Range Status  04/11/2023 03:28 AM >15.5 (H) 4.8 - 5.6 % Final    Comment:    (NOTE)         Prediabetes: 5.7 - 6.4         Diabetes: >6.4         Glycemic control for adults with diabetes: <7.0   03/14/2022 01:35 AM 13.0 (H) 4.8 - 5.6 % Final    Comment:    (NOTE) Pre  diabetes:          5.7%-6.4%  Diabetes:              >6.4%  Glycemic control for   <7.0% adults with diabetes     CBG: Recent Labs  Lab 10/12/23 0232  GLUCAP >600*    Review of Systems: Positives in BOLD  Gen: Denies fever, chills, weight change, fatigue, night sweats HEENT: Denies blurred vision, double vision, hearing loss, tinnitus, sinus congestion, rhinorrhea, sore throat, neck stiffness, dysphagia PULM: Denies shortness of breath, cough, sputum production, hemoptysis, wheezing CV: Denies chest pain, edema, orthopnea, paroxysmal nocturnal dyspnea, palpitations GI: Denies abdominal pain, nausea, vomiting, diarrhea, hematochezia, melena, constipation, change in bowel habits GU: Denies dysuria, hematuria, polyuria, oliguria, urethral discharge Endocrine: Denies hot or cold intolerance, polyuria, polyphagia or appetite  change Derm: Denies rash, dry skin, scaling or peeling skin change Heme: Denies easy bruising, bleeding, bleeding gums Neuro: Denies headache, numbness, weakness, slurred speech, loss of memory or consciousness  Past Medical History:  She,  has a past medical history of Asthma, Clostridium difficile colitis, Diabetes mellitus without complication (HCC), Hypertension, and Mental disorder.   Surgical History:   Past Surgical History:  Procedure Laterality Date   ABDOMINAL HYSTERECTOMY       Social History:   reports that she has been smoking cigarettes. She has never used smokeless tobacco. She reports that she does not currently use alcohol. She reports current drug use. Drugs: Marijuana and Cocaine.   Family History:  Her family history includes Hypertension in her mother.   Allergies No Known Allergies   Home Medications  Prior to Admission medications   Medication Sig Start Date End Date Taking? Authorizing Provider  Accu-Chek Softclix Lancets lancets Use 3 (three) times daily to check blood sugar 09/16/23   Marcelino Duster, MD  atorvastatin (LIPITOR) 40 MG tablet Take 1 tablet (40 mg total) by mouth at bedtime. 09/16/23 10/16/23  Marcelino Duster, MD  Blood Glucose Monitoring Suppl (BLOOD GLUCOSE MONITOR SYSTEM) w/Device KIT Use 3 (three) times daily to check blood sugar. 09/16/23   Marcelino Duster, MD  buPROPion (WELLBUTRIN SR) 150 MG 12 hr tablet Take 1 tablet (150 mg total) by mouth 2 (two) times daily. 09/16/23 10/16/23  Marcelino Duster, MD  diltiazem (CARDIZEM CD) 120 MG 24 hr capsule Take 1 capsule (120 mg total) by mouth daily. 09/16/23 10/16/23  Marcelino Duster, MD  gabapentin (NEURONTIN) 300 MG capsule Take 1 capsule (300 mg total) by mouth 2 (two) times daily. 09/16/23 10/16/23  Marcelino Duster, MD  Glucose Blood (BLOOD GLUCOSE TEST STRIPS) STRP Use 3 (three) times daily to check blood sugar. 09/16/23   Marcelino Duster, MD   Dextromethorphan-guaiFENesin 20-200 MG/20ML LIQD Take 10 mLs by mouth every 4 (four) hours as needed. 09/16/23   Marcelino Duster, MD  insulin aspart (NOVOLOG) 100 UNIT/ML FlexPen Inject 10 Units into the skin 3 (three) times daily with meals. Patient taking differently: Inject 15 Units into the skin 3 (three) times daily with meals. 09/16/23   Marcelino Duster, MD  insulin glargine (LANTUS) 100 UNIT/ML Solostar Pen Inject 25 Units into the skin 2 (two) times daily. Patient taking differently: Inject 55 Units into the skin daily. 09/16/23 10/16/23  Marcelino Duster, MD  Insulin Pen Needle 31G X 8 MM MISC Use 5 times daily with Lantus and Novolog 09/16/23   Marcelino Duster, MD  Multiple Vitamin (MULTIVITAMIN WITH MINERALS) TABS tablet Take 1 tablet by mouth daily. 09/16/23   Marcelino Duster, MD  omeprazole (PRILOSEC) 20 MG capsule Take 1 capsule (20 mg total) by mouth daily. 09/16/23 10/16/23  Marcelino Duster, MD  sitaGLIPtin (JANUVIA) 100 MG tablet Take 1 tablet (100 mg total) by mouth daily. 09/16/23 10/16/23  Marcelino Duster, MD     Critical care time: 68 minutes       Betsey Holiday, AGACNP-BC Acute Care Nurse Practitioner [facility]   [phone] / [phone] Please see Amion for details.

## 2023-10-12 NOTE — Progress Notes (Signed)
 Pharmacy Antibiotic Note  MIKEILA BURGEN is a 63 y.o. female admitted on 10/12/2023 with sepsis.  Pharmacy has been consulted for Cefepime & Vancomycin dosing.  Plan: Cefepime 2 gm q24hr per indication & renal fxn.  Pt given Vancomycin 2000 mg once. Vancomycin 750 mg IV Q 24 hrs. Goal AUC 400-550. Expected AUC: 534.4 SCr used: 2.06  Pharmacy will continue to follow and will adjust abx dosing whenever warranted.  Temp (24hrs), Avg:97.8 F (36.6 C), Min:97.8 F (36.6 C), Max:97.8 F (36.6 C)   Recent Labs  Lab 10/12/23 0215 10/12/23 0347  WBC 17.0*  --   CREATININE 2.06*  --   LATICACIDVEN 3.0* 3.5*    Estimated Creatinine Clearance: 28.6 mL/min (A) (by C-G formula based on SCr of 2.06 mg/dL (H)).    No Known Allergies  Antimicrobials this admission: 4/02 Cefepime >>  4/02 Vancomycin >>  Microbiology results: 4/02 BCx: Pending  Thank you for allowing pharmacy to be a part of this patient's care.  Otelia Sergeant, PharmD, [facility] 10/12/2023 5:10 AM

## 2023-10-12 NOTE — Progress Notes (Signed)
 Elink monitoring for the code sepsis protocol.

## 2023-10-12 NOTE — Progress Notes (Signed)
 CODE SEPSIS - PHARMACY COMMUNICATION  **Broad Spectrum Antibiotics should be administered within 1 hour of Sepsis diagnosis**  Time Code Sepsis Called/Page Received: 0159  Antibiotics Ordered: Cefepime & Vancomycin  Time of 1st antibiotic administration: 0224  Otelia Sergeant, PharmD, [facility] 10/12/2023 2:06 AM

## 2023-10-12 NOTE — Progress Notes (Signed)
 PHARMACY CONSULT NOTE - ELECTROLYTES  Pharmacy Consult for Electrolyte Monitoring and Replacement   Recent Labs: Height: 5\' 2"  (157.5 cm) Weight: 84.8 kg (187 lb) IBW/kg (Calculated) : 50.1 Estimated Creatinine Clearance: 33.5 mL/min (A) (by C-G formula based on SCr of 1.76 mg/dL (H)). Potassium (mmol/L)  Date Value  10/12/2023 4.3   Magnesium (mg/dL)  Date Value  21/30/8657 2.4   Calcium (mg/dL)  Date Value  84/69/6295 9.4   Albumin (g/dL)  Date Value  28/41/3244 4.6   Phosphorus (mg/dL)  Date Value  07/14/7251 2.8   Sodium (mmol/L)  Date Value  10/12/2023 135     Assessment  Pamela Lane is a 63 y.o. female presenting with recurrent DKA . PMH significant for T2DM with recurrent DKA,HTN, Asthma, polysubstance abuse, Anxiety & Depression, Bipolar disorder, CKD Stage 3, SVT,C.Diff colitis. Pharmacy has been consulted to monitor and replace electrolytes.  Diet: CLD MIVF: LR @ 125 mL/hr, sodium bicarb drip Pertinent medications:    Goal of Therapy: Electrolytes WNL  Plan:  No electrolyte replacement at this time Check BMP, Mg, Phos with AM labs  Thank you for allowing pharmacy to be a part of this patient's care.  Angelique Blonder, PharmD Clinical Pharmacist 10/12/2023 8:46 AM

## 2023-10-12 NOTE — Inpatient Diabetes Management (Signed)
 Inpatient Diabetes Program Recommendations  AACE/ADA: New Consensus Statement on Inpatient Glycemic Control (2015)  Target Ranges:  Prepandial:   less than 140 mg/dL      Peak postprandial:   less than 180 mg/dL (1-2 hours)      Critically ill patients:  140 - 180 mg/dL    Latest Reference Range & Units 04/11/23 03:28  Hemoglobin A1C 4.8 - 5.6 % >15.5 (H)  >398 mg/dl    Latest Reference Range & Units 10/12/23 02:15  Beta-Hydroxybutyric Acid 0.05 - 0.27 mmol/L >8.00 (H)    Latest Reference Range & Units 10/12/23 02:15  Sodium 135 - 145 mmol/L 127 (L)  Potassium 3.5 - 5.1 mmol/L 4.9  Chloride 98 - 111 mmol/L 95 (L)  CO2 22 - 32 mmol/L <7 (L)  Glucose 70 - 99 mg/dL 638 (HH)  BUN 8 - 23 mg/dL 41 (H)  Creatinine 7.56 - 1.00 mg/dL 4.33 (H)  Calcium 8.9 - 10.3 mg/dL 9.0  Anion gap 5 - 15  NOT CALCULATED    Latest Reference Range & Units 10/12/23 02:32 10/12/23 04:14 10/12/23 04:58 10/12/23 05:47 10/12/23 06:46  Glucose-Capillary 70 - 99 mg/dL >295 (HH)  IV Insulin Drip Started at 0346 >600 (HH) 573 (HH) >600 (HH) 388 (H)     Admit to ICU with recurrent DKA and severe metabolic acidosis as well as AKI on CKD and suspected sepsis  Pt admits to using cocaine on 10/11/23   History: DM  Home DM Meds: Novolog 15 units TID with meals       Lantus 55 units daily       Januvia 100 mg daily  Current Orders: IV Insulin Drip     Pt well known to the Inpatient Diabetes Team Has been counseled by the Diabetes RN Sept 2024 and again January 2025 Continues to use Cocaine Pt went for Diabetes Self-Management Education 09/21/2023     --Will follow patient during hospitalization--  Ambrose Finland RN, MSN, CDCES Diabetes Coordinator Inpatient Glycemic Control Team Team Pager: [phone] (8a-5p)

## 2023-10-12 NOTE — ED Triage Notes (Signed)
 Pt to ED via EMS from home, pt has had sob x2 days. Per ems pts room air sats 80%, pt was given 2 albuterol and 1 duoneb by ems. Pt denies pain, states she has had cough fever and chills as well.

## 2023-10-12 NOTE — ED Provider Notes (Signed)
 [facility] Provider Note    Event Date/Time   First MD Initiated Contact with Patient 10/12/23 0157     (approximate)   History   Respiratory Distress   HPI  Pamela Lane is a 63 y.o. female with history of asthma, tobacco use, hypertension, diabetes with medical noncompliance, crack cocaine use who presents to the emergency department with shortness of breath, respiratory distress.  Patient reports 2 to 3 days of productive cough, congestion, subjective fevers and chills.  EMS found her sats to be 80% on room air.  She does not wear oxygen chronically.  They gave her breathing treatments with some relief in symptoms.  She was tachycardic, tachypneic with borderline low blood pressure.  Afebrile.  Patient has been admitted multiple times previously for DKA.  She reports she has not taken her diabetes medication in days.  States this is because of her crack cocaine use.   History provided by patient, EMS.    Past Medical History:  Diagnosis Date   Asthma    Clostridium difficile colitis    Diabetes mellitus without complication (HCC)    Hypertension    Mental disorder    pt reports 'I have all of them mental disorders'    Past Surgical History:  Procedure Laterality Date   ABDOMINAL HYSTERECTOMY      MEDICATIONS:  Prior to Admission medications   Medication Sig Start Date End Date Taking? Authorizing Provider  Accu-Chek Softclix Lancets lancets Use 3 (three) times daily to check blood sugar 09/16/23   Marcelino Duster, MD  atorvastatin (LIPITOR) 40 MG tablet Take 1 tablet (40 mg total) by mouth at bedtime. 09/16/23 10/16/23  Marcelino Duster, MD  Blood Glucose Monitoring Suppl (BLOOD GLUCOSE MONITOR SYSTEM) w/Device KIT Use 3 (three) times daily to check blood sugar. 09/16/23   Marcelino Duster, MD  buPROPion (WELLBUTRIN SR) 150 MG 12 hr tablet Take 1 tablet (150 mg total) by mouth 2 (two) times daily. 09/16/23 10/16/23  Marcelino Duster, MD  diltiazem (CARDIZEM CD) 120 MG 24 hr capsule Take 1 capsule (120 mg total) by mouth daily. 09/16/23 10/16/23  Marcelino Duster, MD  gabapentin (NEURONTIN) 300 MG capsule Take 1 capsule (300 mg total) by mouth 2 (two) times daily. 09/16/23 10/16/23  Marcelino Duster, MD  Glucose Blood (BLOOD GLUCOSE TEST STRIPS) STRP Use 3 (three) times daily to check blood sugar. 09/16/23   Marcelino Duster, MD  Dextromethorphan-guaiFENesin 20-200 MG/20ML LIQD Take 10 mLs by mouth every 4 (four) hours as needed. 09/16/23   Marcelino Duster, MD  insulin aspart (NOVOLOG) 100 UNIT/ML FlexPen Inject 10 Units into the skin 3 (three) times daily with meals. Patient taking differently: Inject 15 Units into the skin 3 (three) times daily with meals. 09/16/23   Marcelino Duster, MD  insulin glargine (LANTUS) 100 UNIT/ML Solostar Pen Inject 25 Units into the skin 2 (two) times daily. Patient taking differently: Inject 55 Units into the skin daily. 09/16/23 10/16/23  Marcelino Duster, MD  Insulin Pen Needle 31G X 8 MM MISC Use 5 times daily with Lantus and Novolog 09/16/23   Marcelino Duster, MD  Multiple Vitamin (MULTIVITAMIN WITH MINERALS) TABS tablet Take 1 tablet by mouth daily. 09/16/23   Marcelino Duster, MD  omeprazole (PRILOSEC) 20 MG capsule Take 1 capsule (20 mg total) by mouth daily. 09/16/23 10/16/23  Marcelino Duster, MD  sitaGLIPtin (JANUVIA) 100 MG tablet Take 1 tablet (100 mg total) by mouth daily. 09/16/23 10/16/23  Marcelino Duster, MD  Physical Exam   Triage Vital Signs: ED Triage Vitals  Encounter Vitals Group     BP 10/12/23 0217 137/74     Systolic BP Percentile --      Diastolic BP Percentile --      Pulse Rate 10/12/23 0211 (!) 123     Resp 10/12/23 0211 (!) 25     Temp 10/12/23 0211 97.8 F (36.6 C)     Temp src --      SpO2 10/12/23 0211 100 %     Weight 10/12/23 0158 187 lb (84.8 kg)     Height 10/12/23 0158 5\' 2"  (1.575 m)     Head Circumference  --      Peak Flow --      Pain Score 10/12/23 0157 0     Pain Loc --      Pain Education --      Exclude from Growth Chart --     Most recent vital signs: Vitals:   10/12/23 0530 10/12/23 0630  BP: 136/64 135/63  Pulse: (!) 118 (!) 112  Resp: 17 (!) 26  Temp:    SpO2: 100% 99%    CONSTITUTIONAL: Alert, responds appropriately to questions.  Chronically ill-appearing HEAD: Normocephalic, atraumatic EYES: Conjunctivae clear, pupils appear equal, sclera nonicteric ENT: normal nose; dry mucous membranes NECK: Supple, normal ROM CARD: Regular and tachycardic; S1 and S2 appreciated RESP: Patient is tachypneic with diffuse inspiratory next very wheezing, no rhonchi or rales, speaking full sentences, no hypoxia at rest on room air currently ABD/GI: Non-distended; soft, non-tender, no rebound, no guarding, no peritoneal signs BACK: The back appears normal EXT: Normal ROM in all joints; no deformity noted, no edema, no calf tenderness or calf swelling SKIN: Normal color for age and race; warm; no rash on exposed skin NEURO: Moves all extremities equally, normal speech PSYCH: The patient's mood and manner are appropriate.   ED Results / Procedures / Treatments   LABS: (all labs ordered are listed, but only abnormal results are displayed) Labs Reviewed  LACTIC ACID, PLASMA - Abnormal; Notable for the following components:      Result Value   Lactic Acid, Venous 3.0 (*)    All other components within normal limits  LACTIC ACID, PLASMA - Abnormal; Notable for the following components:   Lactic Acid, Venous 3.5 (*)    All other components within normal limits  COMPREHENSIVE METABOLIC PANEL WITH GFR - Abnormal; Notable for the following components:   Sodium 127 (*)    Chloride 95 (*)    CO2 <7 (*)    Glucose, Bld 769 (*)    BUN 41 (*)    Creatinine, Ser 2.06 (*)    Total Protein 8.4 (*)    AST 13 (*)    Total Bilirubin 1.6 (*)    GFR, Estimated 27 (*)    All other components  within normal limits  CBC WITH DIFFERENTIAL/PLATELET - Abnormal; Notable for the following components:   WBC 17.0 (*)    Neutro Abs 13.7 (*)    Monocytes Absolute 1.1 (*)    Abs Immature Granulocytes 0.17 (*)    All other components within normal limits  PROTIME-INR - Abnormal; Notable for the following components:   Prothrombin Time 15.4 (*)    All other components within normal limits  URINALYSIS, W/ REFLEX TO CULTURE (INFECTION SUSPECTED) - Abnormal; Notable for the following components:   Color, Urine STRAW (*)    APPearance CLEAR (*)    Glucose, UA >=  500 (*)    Hgb urine dipstick SMALL (*)    Ketones, ur 20 (*)    Protein, ur 30 (*)    All other components within normal limits  BLOOD GAS, VENOUS - Abnormal; Notable for the following components:   pH, Ven 6.95 (*)    pCO2, Ven 24 (*)    pO2, Ven 51 (*)    Bicarbonate 5.3 (*)    Acid-base deficit 25.7 (*)    All other components within normal limits  URINE DRUG SCREEN, QUALITATIVE ([facility] ONLY) - Abnormal; Notable for the following components:   Cocaine Metabolite,Ur [place] POSITIVE (*)    All other components within normal limits  BETA-HYDROXYBUTYRIC ACID - Abnormal; Notable for the following components:   Beta-Hydroxybutyric Acid >8.00 (*)    All other components within normal limits  CBG MONITORING, ED - Abnormal; Notable for the following components:   Glucose-Capillary >600 (*)    All other components within normal limits  CBG MONITORING, ED - Abnormal; Notable for the following components:   Glucose-Capillary >600 (*)    All other components within normal limits  CBG MONITORING, ED - Abnormal; Notable for the following components:   Glucose-Capillary 573 (*)    All other components within normal limits  CBG MONITORING, ED - Abnormal; Notable for the following components:   Glucose-Capillary >600 (*)    All other components within normal limits  CBG MONITORING, ED - Abnormal; Notable for the following components:    Glucose-Capillary 388 (*)    All other components within normal limits  RESP PANEL BY RT-PCR (RSV, FLU A&B, COVID)  RVPGX2  CULTURE, BLOOD (ROUTINE X 2)  CULTURE, BLOOD (ROUTINE X 2)  APTT  BRAIN NATRIURETIC PEPTIDE  MAGNESIUM  BASIC METABOLIC PANEL WITH GFR  BASIC METABOLIC PANEL WITH GFR  BASIC METABOLIC PANEL WITH GFR  BASIC METABOLIC PANEL WITH GFR  BASIC METABOLIC PANEL WITH GFR  BETA-HYDROXYBUTYRIC ACID  BETA-HYDROXYBUTYRIC ACID  BETA-HYDROXYBUTYRIC ACID  BETA-HYDROXYBUTYRIC ACID  BETA-HYDROXYBUTYRIC ACID  BLOOD GAS, VENOUS  BLOOD GAS, VENOUS  HEMOGLOBIN A1C  LACTIC ACID, PLASMA  LACTIC ACID, PLASMA  PHOSPHORUS  PROCALCITONIN  LIPASE, BLOOD  TROPONIN I (HIGH SENSITIVITY)     EKG:    Date: 10/12/2023 2:12 AM  Rate: 127  Rhythm: Sinus tachycardia  QRS Axis: normal  Intervals: Prolonged QT interval 526 ms  ST/T Wave abnormalities: normal  Conduction Disutrbances: none  Narrative Interpretation: Sinus tachycardia, rate related changes    RADIOLOGY: My personal review and interpretation of imaging: Chest x-ray clear.  CT head unremarkable.  I have personally reviewed all radiology reports.   CT HEAD WO CONTRAST ( ) Result Date: 10/12/2023 CLINICAL DATA:  New onset headache EXAM: CT HEAD WITHOUT CONTRAST TECHNIQUE: Contiguous axial images were obtained from the base of the skull through the vertex without intravenous contrast. RADIATION DOSE REDUCTION: This exam was performed according to the departmental dose-optimization program which includes automated exposure control, adjustment of the mA and/or kV according to patient size and/or use of iterative reconstruction technique. COMPARISON:  03/13/2022 FINDINGS: Brain: No mass,hemorrhage or extra-axial collection. Normal appearance of the parenchyma and CSF spaces. Vascular: No hyperdense vessel or unexpected vascular calcification. Skull: The visualized skull base, calvarium and extracranial soft tissues are normal.  Sinuses/Orbits: No fluid levels or advanced mucosal thickening of the visualized paranasal sinuses. No mastoid or middle ear effusion. Normal orbits. Other: None. IMPRESSION: Normal head CT. Electronically Signed   By: Deatra Robinson M.D.   On: 10/12/2023 03:16  DG Chest Port 1 View Result Date: 10/12/2023 CLINICAL DATA:  Sepsis EXAM: PORTABLE CHEST 1 VIEW COMPARISON:  None Available. FINDINGS: The heart size and mediastinal contours are within normal limits. Both lungs are clear. The visualized skeletal structures are unremarkable. IMPRESSION: No active disease. Electronically Signed   By: Helyn Numbers M.D.   On: 10/12/2023 02:22     PROCEDURES:  Critical Care performed: Yes, see critical care procedure note(s)   CRITICAL CARE Performed by: Baxter Hire Natally Ribera   Total critical care time: 40 minutes  Critical care time was exclusive of separately billable procedures and treating other patients.  Critical care was necessary to treat or prevent imminent or life-threatening deterioration.  Critical care was time spent personally by me on the following activities: development of treatment plan with patient and/or surrogate as well as nursing, discussions with consultants, evaluation of patient's response to treatment, examination of patient, obtaining history from patient or surrogate, ordering and performing treatments and interventions, ordering and review of laboratory studies, ordering and review of radiographic studies, pulse oximetry and re-evaluation of patient's condition.   Marland Kitchen1-3 Lead EKG Interpretation  Performed by: Gustavo Meditz, Layla Maw, DO Authorized by: Katilin Raynes, Layla Maw, DO     Interpretation: abnormal     ECG rate:  127   ECG rate assessment: tachycardic     Rhythm: sinus tachycardia     Ectopy: none     Conduction: normal       IMPRESSION / MDM / ASSESSMENT AND PLAN / ED COURSE  I reviewed the triage vital signs and the nursing notes.    Patient here with shortness of  breath, wheezing, respiratory failure, hypoxia.  The patient is on the cardiac monitor to evaluate for evidence of arrhythmia and/or significant heart rate changes.   DIFFERENTIAL DIAGNOSIS (includes but not limited to):   Asthma exacerbation, pneumonia, viral URI, CHF, pneumothorax, ACS, PE   Patient's presentation is most consistent with acute presentation with potential threat to life or bodily function.   PLAN: Will obtain labs, cultures, COVID and flu swab, chest x-ray.  Will continue breathing treatments, Solu-Medrol.   MEDICATIONS GIVEN IN ED: Medications  insulin regular, human (MYXREDLIN) 100 units/ 100 mL infusion (8.5 Units/hr Intravenous Rate/Dose Change 10/12/23 0646)  lactated ringers infusion ( Intravenous New Bag/Given 10/12/23 0343)  dextrose 5 % in lactated ringers infusion (0 mLs Intravenous Hold 10/12/23 0343)  dextrose 50 % solution 0-50 mL (has no administration in time range)  docusate sodium (COLACE) capsule 100 mg (has no administration in time range)  polyethylene glycol (MIRALAX / GLYCOLAX) packet 17 g (has no administration in time range)  heparin injection 5,000 Units (5,000 Units Subcutaneous Given 10/12/23 0545)  sodium bicarbonate 150 mEq in sterile water 1,150 mL infusion ( Intravenous New Bag/Given 10/12/23 0545)  ipratropium-albuterol (DUONEB) 0.5-2.5 (3) MG/3ML nebulizer solution 3 mL (has no administration in time range)  vancomycin (VANCOREADY) IVPB 750 mg/150 mL (has no administration in time range)  ceFEPIme (MAXIPIME) 2 g in sodium chloride 0.9 % 100 mL IVPB (has no administration in time range)  buPROPion (WELLBUTRIN SR) 12 hr tablet 150 mg (has no administration in time range)  diltiazem (CARDIZEM CD) 24 hr capsule 120 mg (has no administration in time range)  atorvastatin (LIPITOR) tablet 40 mg (has no administration in time range)  pantoprazole (PROTONIX) EC tablet 40 mg (has no administration in time range)  lactated ringers bolus 1,000 mL (0 mLs  Intravenous Stopped 10/12/23 0334)  vancomycin (VANCOCIN) IVPB 1000 mg/200  mL premix (0 mg Intravenous Stopped 10/12/23 0355)  ceFEPIme (MAXIPIME) 2 g in sodium chloride 0.9 % 100 mL IVPB (0 g Intravenous Stopped 10/12/23 0256)  methylPREDNISolone sodium succinate (SOLU-MEDROL) 125 mg/2 mL injection 125 mg (125 mg Intravenous Given 10/12/23 0222)  albuterol (PROVENTIL) (2.5 MG/3ML) 0.083% nebulizer solution 5 mg (5 mg Nebulization Given 10/12/23 0255)  lactated ringers bolus 1,000 mL (0 mLs Intravenous Stopped 10/12/23 0343)  ketorolac (TORADOL) 30 MG/ML injection 30 mg (30 mg Intravenous Given 10/12/23 0252)  lactated ringers bolus 1,000 mL (0 mLs Intravenous Stopped 10/12/23 0515)  potassium chloride 10 mEq in 100 mL IVPB (0 mEq Intravenous Stopped 10/12/23 0545)  sodium bicarbonate injection 100 mEq (100 mEq Intravenous Given 10/12/23 0541)  vancomycin (VANCOCIN) IVPB 1000 mg/200 mL premix (0 mg Intravenous Stopped 10/12/23 0641)     ED COURSE: Patient's VBG shows significant metabolic acidosis.  Patient likely in DKA.  Blood sugar is greater than 600.  Will continue aggressive hydration and start insulin infusion.  She reports she has not been taking her insulin as prescribed due to her history of illicit drug use.  Urine shows ketonuria but no infection.  Drug screen positive for cocaine.    Lactic elevated at 3.0 which is likely multifactorial from albuterol use, respiratory failure and also possible sepsis.  She does have a leukocytosis of 17,000 with left shift.  She is getting broad-spectrum antibiotics and 30 mL/kg IV fluid bolus.  Chest x-ray shows no pneumonia when reviewed and interpreted by myself and the radiologist.  COVID, flu and RSV negative.  Normal electrolytes.  Negative troponin and BNP.  Renal function also elevated creatinine greater than 2.  She is getting aggressively hydrated.  Given pH less than 7, will discuss with ICU for admission.  Patient was also complaining of significant  headache and moaning in pain.  Given her crack cocaine use, CT head was obtained which shows no acute abnormality.  Given Tylenol for discomfort and now resting comfortably.   CONSULTS: Discussed with Cheryll Cockayne Andrey Farmer, NP with critical care who will admit patient.   OUTSIDE RECORDS REVIEWED: Reviewed most recent admission for DKA.       FINAL CLINICAL IMPRESSION(S) / ED DIAGNOSES   Final diagnoses:  Exacerbation of intermittent asthma, unspecified asthma severity  Acute respiratory failure with hypoxia (HCC)  Diabetic ketoacidosis without coma associated with type 2 diabetes mellitus (HCC)  AKI (acute kidney injury) (HCC)  Substance use disorder     Rx / DC Orders   ED Discharge Orders     None        Note:  This document was prepared using Dragon voice recognition software and may include unintentional dictation errors.   Jazzmin Newbold, Layla Maw, DO 10/12/23 [phone]

## 2023-10-13 DIAGNOSIS — N179 Acute kidney failure, unspecified: Secondary | ICD-10-CM | POA: Diagnosis not present

## 2023-10-13 DIAGNOSIS — J9601 Acute respiratory failure with hypoxia: Secondary | ICD-10-CM | POA: Diagnosis not present

## 2023-10-13 DIAGNOSIS — E1110 Type 2 diabetes mellitus with ketoacidosis without coma: Secondary | ICD-10-CM | POA: Diagnosis not present

## 2023-10-13 LAB — GLUCOSE, CAPILLARY
Glucose-Capillary: 111 mg/dL — ABNORMAL HIGH (ref 70–99)
Glucose-Capillary: 153 mg/dL — ABNORMAL HIGH (ref 70–99)
Glucose-Capillary: 158 mg/dL — ABNORMAL HIGH (ref 70–99)
Glucose-Capillary: 170 mg/dL — ABNORMAL HIGH (ref 70–99)
Glucose-Capillary: 217 mg/dL — ABNORMAL HIGH (ref 70–99)
Glucose-Capillary: 269 mg/dL — ABNORMAL HIGH (ref 70–99)
Glucose-Capillary: 375 mg/dL — ABNORMAL HIGH (ref 70–99)

## 2023-10-13 LAB — PHOSPHORUS: Phosphorus: 2.9 mg/dL (ref 2.5–4.6)

## 2023-10-13 LAB — RENAL FUNCTION PANEL
Albumin: 3 g/dL — ABNORMAL LOW (ref 3.5–5.0)
Anion gap: 7 (ref 5–15)
BUN: 19 mg/dL (ref 8–23)
CO2: 18 mmol/L — ABNORMAL LOW (ref 22–32)
Calcium: 8.6 mg/dL — ABNORMAL LOW (ref 8.9–10.3)
Chloride: 110 mmol/L (ref 98–111)
Creatinine, Ser: 0.91 mg/dL (ref 0.44–1.00)
GFR, Estimated: >60 mL/min (ref >60)
Glucose, Bld: 283 mg/dL — ABNORMAL HIGH (ref 70–99)
Phosphorus: 1.3 mg/dL — ABNORMAL LOW (ref 2.5–4.6)
Potassium: 4.6 mmol/L (ref 3.5–5.1)
Sodium: 135 mmol/L (ref 135–145)

## 2023-10-13 LAB — BLOOD GAS, VENOUS
Bicarbonate: 16.7 mmol/L — ABNORMAL LOW (ref 20.0–28.0)
O2 Saturation: 59.7 mmol/L — ABNORMAL HIGH (ref 0.0–2.0)
Patient temperature: 37
Patient temperature: 59.7 %
pCO2, Ven: 34 mmHg — ABNORMAL LOW (ref 44–60)
pH, Ven: 7.3 (ref 7.25–7.43)
pO2, Ven: 31 mmol/L — CL (ref 32–45)

## 2023-10-13 LAB — CBC
HCT: 30.5 % — ABNORMAL LOW (ref 36.0–46.0)
Hemoglobin: 11.1 g/dL — ABNORMAL LOW (ref 12.0–15.0)
MCH: 30.4 pg (ref 26.0–34.0)
MCHC: 36.4 g/dL — ABNORMAL HIGH (ref 30.0–36.0)
MCV: 83.6 fL (ref 80.0–100.0)
Platelets: 224 10*3/uL (ref 150–400)
RBC: 3.65 MIL/uL — ABNORMAL LOW (ref 3.87–5.11)
RDW: 12.9 % (ref 11.5–15.5)
WBC: 7.5 10*3/uL (ref 4.0–10.5)
nRBC: 0 % (ref 0.0–0.2)

## 2023-10-13 LAB — MAGNESIUM: Magnesium: 1.9 mg/dL (ref 1.7–2.4)

## 2023-10-13 LAB — BETA-HYDROXYBUTYRIC ACID: Beta-Hydroxybutyric Acid: 0.41 mmol/L — ABNORMAL HIGH (ref 0.05–0.27)

## 2023-10-13 MED ORDER — K PHOS MONO-SOD PHOS DI & MONO 155-852-130 MG PO TABS
500.0000 mg | ORAL_TABLET | ORAL | Status: AC
  Administered 2023-10-13 (×5): 500 mg via ORAL
  Filled 2023-10-13 (×6): qty 2

## 2023-10-13 MED ORDER — INSULIN ASPART 100 UNIT/ML IJ SOLN: 4.0000 [IU] | Freq: Three times a day (TID) | INTRAMUSCULAR | Status: DC

## 2023-10-13 MED ORDER — INSULIN ASPART 100 UNIT/ML IJ SOLN: 0.0000 [IU] | Freq: Three times a day (TID) | INTRAMUSCULAR | Status: DC

## 2023-10-13 MED ORDER — INSULIN GLARGINE-YFGN 100 UNIT/ML ~~LOC~~ SOLN
20.0000 [IU] | Freq: Once | SUBCUTANEOUS | Status: AC
  Administered 2023-10-13: 20 [IU] via SUBCUTANEOUS
  Filled 2023-10-13: qty 0.2

## 2023-10-13 MED ORDER — INSULIN ASPART 100 UNIT/ML IJ SOLN: 0.0000 [IU] | Freq: Every day | INTRAMUSCULAR | Status: DC

## 2023-10-13 MED ORDER — INSULIN ASPART 100 UNIT/ML IJ SOLN
12.0000 [IU] | Freq: Once | INTRAMUSCULAR | Status: AC
  Administered 2023-10-13: 12 [IU] via SUBCUTANEOUS
  Filled 2023-10-13: qty 1

## 2023-10-13 MED ORDER — SODIUM CHLORIDE 0.9 % IV SOLN
2.0000 g | Freq: Three times a day (TID) | INTRAVENOUS | Status: DC
  Filled 2023-10-13: qty 12.5

## 2023-10-13 MED ORDER — INSULIN ASPART 100 UNIT/ML IJ SOLN
7.0000 [IU] | Freq: Three times a day (TID) | INTRAMUSCULAR | Status: DC
  Administered 2023-10-13 – 2023-10-14 (×5): 7 [IU] via SUBCUTANEOUS
  Filled 2023-10-13 (×5): qty 1

## 2023-10-13 MED ORDER — INSULIN GLARGINE-YFGN 100 UNIT/ML ~~LOC~~ SOLN
45.0000 [IU] | Freq: Every day | SUBCUTANEOUS | Status: DC
Start: 2023-10-13 — End: 2023-10-14
  Administered 2023-10-13 – 2023-10-14 (×2): 45 [IU] via SUBCUTANEOUS
  Filled 2023-10-13 (×2): qty 0.45

## 2023-10-13 MED ORDER — INSULIN ASPART 100 UNIT/ML IJ SOLN
0.0000 [IU] | INTRAMUSCULAR | Status: DC
  Administered 2023-10-13: 3 [IU] via SUBCUTANEOUS
  Administered 2023-10-13: 5 [IU] via SUBCUTANEOUS
  Administered 2023-10-13: 15 [IU] via SUBCUTANEOUS
  Administered 2023-10-13: 3 [IU] via SUBCUTANEOUS
  Administered 2023-10-14: 8 [IU] via SUBCUTANEOUS
  Administered 2023-10-14 (×2): 3 [IU] via SUBCUTANEOUS
  Administered 2023-10-14: 15 [IU] via SUBCUTANEOUS
  Filled 2023-10-13 (×8): qty 1

## 2023-10-13 NOTE — TOC Initial Note (Signed)
 Transition of Care [facility]) - Initial/Assessment Note    Patient Details  Name: Pamela Lane MRN: [id number] Date of Birth: 1960/07/28  [facility]) CM/SW Contact:    Margarito Liner, LCSW Phone Number: 10/13/2023, 11:38 AM  Clinical Narrative: Readmission prevention screen complete. CSW met with patient. No supports at bedside. CSW introduced role and explained that discharge planning would be discussed. PCP office is the [facility]. She does not see a specific provider there. She uses Medicaid Transportation to get to appointments. She uses the [facility] No issues obtaining medications. Patient lives at home with her boyfriend. No home health or DME use prior to admission. No further concerns. CSW will continue to follow patient for support and facilitate return home once stable. A friend will transport her home at discharge.                 Expected Discharge Plan: Home/Self Care Barriers to Discharge: Continued Medical Work up   Patient Goals and CMS Choice            Expected Discharge Plan and Services     Post Acute Care Choice: NA Living arrangements for the past 2 months: Single Family Home                                      Prior Living Arrangements/Services Living arrangements for the past 2 months: Single Family Home Lives with:: Significant Other Patient language and need for interpreter reviewed:: Yes Do you feel safe going back to the place where you live?: Yes      Need for Family Participation in Patient Care: Yes (Comment) Care giver support system in place?: Yes (comment)   Criminal Activity/Legal Involvement Pertinent to Current Situation/Hospitalization: No - Comment as needed  Activities of Daily Living   ADL Screening (condition at time of admission) Independently performs ADLs?: No Does the patient have a NEW difficulty with bathing/dressing/toileting/self-feeding that is expected to last >3 days?: Yes  (Initiates electronic notice to provider for possible OT consult) Does the patient have a NEW difficulty with getting in/out of bed, walking, or climbing stairs that is expected to last >3 days?: Yes (Initiates electronic notice to provider for possible PT consult) Does the patient have a NEW difficulty with communication that is expected to last >3 days?: No Is the patient deaf or have difficulty hearing?: No Does the patient have difficulty seeing, even when wearing glasses/contacts?: No Does the patient have difficulty concentrating, remembering, or making decisions?: No  Permission Sought/Granted                  Emotional Assessment Appearance:: Appears stated age Attitude/Demeanor/Rapport: Engaged, Gracious Affect (typically observed): Accepting, Appropriate, Calm, Pleasant Orientation: : Oriented to Self, Oriented to Place, Oriented to  Time, Oriented to Situation Alcohol / Substance Use: Not Applicable Psych Involvement: No (comment)  Admission diagnosis:  DKA (diabetic ketoacidosis) (HCC) [E11.10] Acute respiratory failure with hypoxia (HCC) [J96.01] AKI (acute kidney injury) (HCC) [N17.9] Diabetic ketoacidosis without coma associated with type 2 diabetes mellitus (HCC) [E11.10] Substance use disorder [F19.90] Exacerbation of intermittent asthma, unspecified asthma severity [J45.21] Patient Active Problem List   Diagnosis Date Noted   Acute respiratory failure with hypoxia (HCC) 10/12/2023   Sepsis (HCC) 07/28/2023   Nausea, vomiting and diarrhea 08/21/2022   Chronic kidney disease, stage 3a (HCC) 08/21/2022   Polysubstance abuse (HCC) 08/21/2022  Anxiety and depression 08/21/2022   UTI (urinary tract infection) 08/21/2022   Severe sepsis (HCC) 03/13/2022   SIRS (systemic inflammatory response syndrome) (HCC) 01/11/2021   Hypotension 01/11/2021   Abdominal pain 01/11/2021   Chest pain 01/11/2021   HLD (hyperlipidemia) 01/11/2021   Asthma 01/11/2021   GERD  (gastroesophageal reflux disease) 01/11/2021   Hyperkalemia 01/11/2021   Alcohol abuse 01/11/2021   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   Acute metabolic encephalopathy 01/11/2021   Cocaine abuse (HCC)    Acute respiratory distress    SVT (supraventricular tachycardia) (HCC)    Leukocytosis    Hypokalemia    Hypophosphatemia    DKA (diabetic ketoacidosis) (HCC) 08/27/2020   Tobacco abuse 10/04/2019   DKA, type 2 (HCC) 10/02/2019   Essential hypertension 10/02/2019   Bipolar disorder (HCC) 01/17/2019   Acute pancreatitis 05/15/2016   AKI (acute kidney injury) (HCC)    Diabetic ketoacidosis without coma associated with type 2 diabetes mellitus (HCC)    Hyperglycemia due to type 2 diabetes mellitus (HCC) 01/13/2016   Acute UTI 01/13/2016   DKA (diabetic ketoacidosis) (HCC) 01/08/2016   Hyponatremia 01/08/2016   Dehydration 01/08/2016   Urinary tract infection 01/08/2016   Upper abdominal pain 01/08/2016   Nausea & vomiting 01/08/2016   PCP:  [facility]:   [facility] - [place], [place] - [address] [place] [address] Phone: [phone] Fax: [phone]  [facility] [address] [place] [address] Phone: [phone] Fax: [phone]     Social Drivers of Health (SDOH) Social History: SDOH Screenings   Food Insecurity: No Food Insecurity (10/12/2023)  Housing: Low Risk  (10/12/2023)  Transportation Needs: No Transportation Needs (10/12/2023)  Utilities: Not At Risk (10/12/2023)  Depression (PHQ2-9): Low Risk  (09/21/2023)  Social Connections: Unknown (10/12/2023)  Recent Concern: Social Connections - Moderately Isolated (09/12/2023)  Tobacco Use: High Risk (10/12/2023)   SDOH Interventions:     Readmission Risk Interventions    10/13/2023   11:37 AM 09/15/2023    9:56 AM 04/11/2023    2:42 PM  Readmission Risk Prevention Plan   Transportation Screening Complete Complete Complete  PCP or Specialist Appt within 3-5 Days  Complete Complete  HRI or Home Care Consult  Complete   Social Work Consult for Recovery Care Planning/Counseling  Complete Complete  Palliative Care Screening  Not Applicable Not Applicable  Medication Review Oceanographer) Complete Referral to Pharmacy Complete  PCP or Specialist appointment within 3-5 days of discharge Complete    SW Recovery Care/Counseling Consult Complete    Palliative Care Screening Not Applicable    Skilled Nursing Facility Not Applicable

## 2023-10-13 NOTE — Plan of Care (Signed)
  Problem: Fluid Volume: Goal: Ability to maintain a balanced intake and output will improve Outcome: Progressing   Problem: Skin Integrity: Goal: Risk for impaired skin integrity will decrease Outcome: Progressing   Problem: Cardiac: Goal: Ability to maintain an adequate cardiac output will improve Outcome: Progressing   Problem: Respiratory: Goal: Will regain and/or maintain adequate ventilation Outcome: Progressing   Problem: Coping: Goal: Ability to adjust to condition or change in health will improve Outcome: Not Progressing   Problem: Health Behavior/Discharge Planning: Goal: Ability to identify and utilize available resources and services will improve Outcome: Not Progressing Goal: Ability to manage health-related needs will improve Outcome: Not Progressing

## 2023-10-13 NOTE — Progress Notes (Signed)
 PROGRESS NOTE    Pamela Lane  ZOX:[id number] DOB: [birth year] DOA: 10/12/2023 PCP: [facility]  Chief Complaint  Patient presents with   Respiratory Distress    Hospital Course:  Pamela Lane is 63 y.o. female with insulin-dependent type 2 diabetes, hypertension, asthma, polysubstance abuse, anxiety, depression, bipolar disorder, CKD stage III, history of C. difficile colitis, history of SVT, multiple prior admissions for DKA.  She presents on this admission with DKA and severe metabolic acidosis requiring bicarb supplementation as well as AKI on CKD.  She was initially admitted to the ICU where she was started on insulin drip.  Anion gap resolved and patient was transferred to [facility] service 4/3.   Patient admits to cocaine use as recently as 4/1.  Subjective: On evaluation today patient is alert and oriented.  We had extensive discussion regarding her difficulty managing diabetes outpatient.  She reports that she is not sure why she cannot take her insulin.  She says the insulin is free and cost is not a barrier.  She reports that sometimes she feels depressed and just does not feel like it.  We also discussed the importance of abstinence from cocaine and she endorses understanding.  She reports she is going to work on finding alternative activities to keep her busy.    Objective: Vitals:   10/13/23 0600 10/13/23 0700 10/13/23 0800 10/13/23 0900  BP: 130/72 120/66 130/65 134/64  Pulse: 95 97 96   Resp: 17 16 (!) 22 (!) 31  Temp:   98.6 F (37 C)   TempSrc:   Oral   SpO2: 100% 98% 98%   Weight:      Height:        Intake/Output Summary (Last 24 hours) at 10/13/2023 0912 Last data filed at 10/13/2023 0830 Gross per 24 hour  Intake 5110.5 ml  Output 225 ml  Net 4885.5 ml   Filed Weights   10/12/23 0158 10/13/23 0355  Weight: 84.8 kg 78.4 kg    Examination: General exam: Appears calm and comfortable, NAD  Respiratory system: No work of breathing,  symmetric chest wall expansion Cardiovascular system: S1 & S2 heard, RRR.  Gastrointestinal system: Abdomen is nondistended, soft and nontender.  Neuro: Alert and oriented. No focal neurological deficits. Extremities: Symmetric, expected ROM Skin: No rashes, lesions Psychiatry: Demonstrates appropriate judgement and insight. Mood & affect appropriate for situation.   Assessment & Plan:  Principal Problem:   DKA (diabetic ketoacidosis) (HCC) Active Problems:   AKI (acute kidney injury) (HCC)   Acute respiratory failure with hypoxia (HCC)    Diabetic ketoacidosis Metabolic acidosis Type 2 diabetes, insulin-dependent, uncontrolled with recurrent DKA - Initially managed on insulin drip in the ICU.  Has been transitioned off now - Anion gap is closed - Continue carb controlled diet - Continue sliding scale insulin and titrate up as tolerated - Continue to titrate basal/bolus - Diabetes educator consulted - Hemoglobin A1c 14.3% -- Extensive discussion regarding the importance of adherence  Sepsis ruled out - Concern for sepsis on arrival.  Received IV fluids and cefepime and vancomycin.  Procalcitonin less than 1, no leukocytosis, has been afebrile.  Blood cultures remain negative, respiratory viral panel negative.  Chest x-ray unremarkable.  Will discontinue further antibiotics - Blood cultures negative to date, follow - Trend CBC  Hypophosphatemia - Replace as needed  Polysubstance abuse - UDS positive for cocaine - Counseled extensively on the importance of abstinence.  DVT prophylaxis: Heparin   Code Status: Full Code Family  Communication:  Discussed directly with patient Disposition:  Inpatient still hospitalized for insulin titration, will discharge to home hopefully tomorrow. PT eval pending. May need HH   Consultants:    Procedures:    Antimicrobials:  Anti-infectives (From admission, onward)    Start     Dose/Rate Route Frequency Ordered Stop   10/13/23 1000   ceFEPIme (MAXIPIME) 2 g in sodium chloride 0.9 % 100 mL IVPB        2 g 200 mL/hr over 30 Minutes Intravenous Every 8 hours 10/13/23 0842     10/13/23 0600  vancomycin (VANCOCIN) IVPB 1000 mg/200 mL premix        1,000 mg 200 mL/hr over 60 Minutes Intravenous Every 24 hours 10/12/23 1210     10/13/23 0000  vancomycin (VANCOREADY) IVPB 750 mg/150 mL  Status:  Discontinued        750 mg 150 mL/hr over 60 Minutes Intravenous Every 24 hours 10/12/23 0518 10/12/23 1210   10/12/23 2300  ceFEPIme (MAXIPIME) 2 g in sodium chloride 0.9 % 100 mL IVPB  Status:  Discontinued        2 g 200 mL/hr over 30 Minutes Intravenous Every 24 hours 10/12/23 0520 10/12/23 0836   10/12/23 1400  ceFEPIme (MAXIPIME) 2 g in sodium chloride 0.9 % 100 mL IVPB  Status:  Discontinued        2 g 200 mL/hr over 30 Minutes Intravenous Every 12 hours 10/12/23 0836 10/13/23 0842   10/12/23 0520  ceFEPIme (MAXIPIME) 2 g in sodium chloride 0.9 % 100 mL IVPB  Status:  Discontinued        2 g 200 mL/hr over 30 Minutes Intravenous Every 24 hours 10/12/23 0518 10/12/23 0520   10/12/23 0515  vancomycin (VANCOCIN) IVPB 1000 mg/200 mL premix        1,000 mg 200 mL/hr over 60 Minutes Intravenous  Once 10/12/23 0514 10/12/23 0641   10/12/23 0200  vancomycin (VANCOCIN) IVPB 1000 mg/200 mL premix        1,000 mg 200 mL/hr over 60 Minutes Intravenous  Once 10/12/23 0158 10/12/23 0355   10/12/23 0200  ceFEPIme (MAXIPIME) 2 g in sodium chloride 0.9 % 100 mL IVPB        2 g 200 mL/hr over 30 Minutes Intravenous  Once 10/12/23 0158 10/12/23 0256       Data Reviewed: I have personally reviewed following labs and imaging studies CBC: Recent Labs  Lab 10/12/23 0215 10/13/23 0323  WBC 17.0* 7.5  NEUTROABS 13.7*  --   HGB 14.6 11.1*  HCT 44.5 30.5*  MCV 91.4 83.6  PLT 348 224   Basic Metabolic Panel: Recent Labs  Lab 10/12/23 0215 10/12/23 0706 10/12/23 0930 10/12/23 1423 10/12/23 1921 10/12/23 2206 10/13/23 0323  NA  127* 135 135 135 136 135 135  K 4.9 4.3 3.8 4.0 3.7 3.2* 4.6  CL 95* 104 108 111 111 111 110  CO2 <7* 10* 15* 18* 19* 19* 18*  GLUCOSE 769* 381* 237* 273* 153* 183* 283*  BUN 41* 36* 32* 27* 24* 21 19  CREATININE 2.06* 1.76* 1.27* 0.98 0.92 0.87 0.91  CALCIUM 9.0 9.4 8.9 8.7* 8.9 8.8* 8.6*  MG 2.4  --   --   --   --   --  1.9  PHOS  --  2.8  --   --   --   --  1.3*   GFR: Estimated Creatinine Clearance: 62.1 mL/min (by C-G formula based on SCr of  0.91 mg/dL). Liver Function Tests: Recent Labs  Lab 10/12/23 0215 10/13/23 0323  AST 13*  --   ALT 13  --   ALKPHOS 119  --   BILITOT 1.6*  --   PROT 8.4*  --   ALBUMIN 4.6 3.0*   CBG: Recent Labs  Lab 10/12/23 2221 10/13/23 0023 10/13/23 0105 10/13/23 0331 10/13/23 0729  GLUCAP 167* 111* 170* 269* 375*    Recent Results (from the past 240 hours)  Resp panel by RT-PCR (RSV, Flu A&B, Covid) Anterior Nasal Swab     Status: None   Collection Time: 10/12/23  2:12 AM   Specimen: Anterior Nasal Swab  Result Value Ref Range Status   SARS Coronavirus 2 by RT PCR NEGATIVE NEGATIVE Final    Comment: (NOTE) SARS-CoV-2 target nucleic acids are NOT DETECTED.  The SARS-CoV-2 RNA is generally detectable in upper respiratory specimens during the acute phase of infection. The lowest concentration of SARS-CoV-2 viral copies this assay can detect is 138 copies/mL. A negative result does not preclude SARS-Cov-2 infection and should not be used as the sole basis for treatment or other patient management decisions. A negative result may occur with  improper specimen collection/handling, submission of specimen other than nasopharyngeal swab, presence of viral mutation(s) within the areas targeted by this assay, and inadequate number of viral copies(<138 copies/mL). A negative result must be combined with clinical observations, patient history, and epidemiological information. The expected result is Negative.  Fact Sheet for Patients:   BloggerCourse.com  Fact Sheet for Healthcare Providers:  SeriousBroker.it  This test is no t yet approved or cleared by the [place] FDA and  has been authorized for detection and/or diagnosis of SARS-CoV-2 by FDA under an Emergency Use Authorization (EUA). This EUA will remain  in effect (meaning this test can be used) for the duration of the COVID-19 declaration under Section 564(b)(1) of the Act, 21 U.S.C.section 360bbb-3(b)(1), unless the authorization is terminated  or revoked sooner.       Influenza A by PCR NEGATIVE NEGATIVE Final   Influenza B by PCR NEGATIVE NEGATIVE Final    Comment: (NOTE) The Xpert Xpress SARS-CoV-2/FLU/RSV plus assay is intended as an aid in the diagnosis of influenza from Nasopharyngeal swab specimens and should not be used as a sole basis for treatment. Nasal washings and aspirates are unacceptable for Xpert Xpress SARS-CoV-2/FLU/RSV testing.  Fact Sheet for Patients: BloggerCourse.com  Fact Sheet for Healthcare Providers: SeriousBroker.it  This test is not yet approved or cleared by the [place] FDA and has been authorized for detection and/or diagnosis of SARS-CoV-2 by FDA under an Emergency Use Authorization (EUA). This EUA will remain in effect (meaning this test can be used) for the duration of the COVID-19 declaration under Section 564(b)(1) of the Act, 21 U.S.C. section 360bbb-3(b)(1), unless the authorization is terminated or revoked.     Resp Syncytial Virus by PCR NEGATIVE NEGATIVE Final    Comment: (NOTE) Fact Sheet for Patients: BloggerCourse.com  Fact Sheet for Healthcare Providers: SeriousBroker.it  This test is not yet approved or cleared by the [place] FDA and has been authorized for detection and/or diagnosis of SARS-CoV-2 by FDA under an Emergency Use  Authorization (EUA). This EUA will remain in effect (meaning this test can be used) for the duration of the COVID-19 declaration under Section 564(b)(1) of the Act, 21 U.S.C. section 360bbb-3(b)(1), unless the authorization is terminated or revoked.  Performed at [facility], [address], [place], [place] [address]  Blood Culture (routine x 2)     Status: None (Preliminary result)   Collection Time: 10/12/23  2:15 AM   Specimen: BLOOD RIGHT ARM  Result Value Ref Range Status   Specimen Description BLOOD RIGHT ARM  Final   Special Requests   Final    BOTTLES DRAWN AEROBIC AND ANAEROBIC Blood Culture adequate volume   Culture   Final    NO GROWTH < 12 HOURS Performed at [facility], [address] [place], [place] [address]    Report Status PENDING  Incomplete  Blood Culture (routine x 2)     Status: None (Preliminary result)   Collection Time: 10/12/23  7:20 AM   Specimen: BLOOD  Result Value Ref Range Status   Specimen Description BLOOD RIGHT HAND  Final   Special Requests   Final    BOTTLES DRAWN AEROBIC ONLY Blood Culture results may not be optimal due to an inadequate volume of blood received in culture bottles   Culture   Final    NO GROWTH <12 HOURS Performed at [facility], [address] [place], [place] [address]    Report Status PENDING  Incomplete  MRSA Next Gen by PCR, Nasal     Status: None   Collection Time: 10/12/23 10:33 AM   Specimen: Nasal Mucosa; Nasal Swab  Result Value Ref Range Status   MRSA by PCR Next Gen NOT DETECTED NOT DETECTED Final    Comment: (NOTE) The GeneXpert MRSA Assay (FDA approved for NASAL specimens only), is one component of a comprehensive MRSA colonization surveillance program. It is not intended to diagnose MRSA infection nor to guide or monitor treatment for MRSA infections. Test performance is not FDA approved in patients less than 8 years old. Performed at [facility], [address] [place], [place] [address]   MRSA Next Gen by PCR, Nasal     Status: None   Collection Time: 10/12/23 12:34 PM   Specimen: Nasal Mucosa; Nasal Swab  Result Value Ref Range Status   MRSA by PCR Next Gen NOT DETECTED NOT DETECTED Final    Comment: (NOTE) The GeneXpert MRSA Assay (FDA approved for NASAL specimens only), is one component of a comprehensive MRSA colonization surveillance program. It is not intended to diagnose MRSA infection nor to guide or monitor treatment for MRSA infections. Test performance is not FDA approved in patients less than 78 years old. Performed at [facility], [address], [place], [place] [address]      Radiology Studies: CT HEAD WO CONTRAST ( ) Result Date: 10/12/2023 CLINICAL DATA:  New onset headache EXAM: CT HEAD WITHOUT CONTRAST TECHNIQUE: Contiguous axial images were obtained from the base of the skull through the vertex without intravenous contrast. RADIATION DOSE REDUCTION: This exam was performed according to the departmental dose-optimization program which includes automated exposure control, adjustment of the mA and/or kV according to patient size and/or use of iterative reconstruction technique. COMPARISON:  03/13/2022 FINDINGS: Brain: No mass,hemorrhage or extra-axial collection. Normal appearance of the parenchyma and CSF spaces. Vascular: No hyperdense vessel or unexpected vascular calcification. Skull: The visualized skull base, calvarium and extracranial soft tissues are normal. Sinuses/Orbits: No fluid levels or advanced mucosal thickening of the visualized paranasal sinuses. No mastoid or middle ear effusion. Normal orbits. Other: None. IMPRESSION: Normal head CT. Electronically Signed   By: Deatra Robinson M.D.   On: 10/12/2023 03:16   DG Chest Port 1 View Result Date: 10/12/2023 CLINICAL DATA:  Sepsis EXAM: PORTABLE  CHEST 1 VIEW COMPARISON:  None Available. FINDINGS: The heart size and mediastinal contours are within  normal limits. Both lungs are clear. The visualized skeletal structures are unremarkable. IMPRESSION: No active disease. Electronically Signed   By: Helyn Numbers M.D.   On: 10/12/2023 02:22    Scheduled Meds:  atorvastatin  40 mg Oral QHS   buPROPion  150 mg Oral BID   Chlorhexidine Gluconate Cloth  6 each Topical Daily   diltiazem  120 mg Oral Daily   heparin  5,000 Units Subcutaneous Q8H   insulin aspart  0-15 Units Subcutaneous Q4H   insulin aspart  7 Units Subcutaneous TID WC   insulin glargine-yfgn  45 Units Subcutaneous Daily   pantoprazole  40 mg Oral Daily   phosphorus  500 mg Oral Q4H   Continuous Infusions:  ceFEPime (MAXIPIME) IV     vancomycin Stopped (10/13/23 4782)     LOS: 1 day  MDM: Patient is high risk for one or more organ failure.  They necessitate ongoing hospitalization for continued IV therapies and subsequent lab monitoring. Total time spent interpreting labs and vitals, coordinating care amongst consultants and care team members, directly assessing and discussing care with the patient and/or family: 55 min    Debarah Crape, DO [facility]  To contact the attending physician between 7A-7P please use Epic Chat. To contact the covering physician during after hours 7P-7A, please review Amion.   10/13/2023, 9:12 AM   *This document has been created with the assistance of dictation software. Please excuse typographical errors. *

## 2023-10-13 NOTE — Plan of Care (Signed)
  Problem: Cardiac: Goal: Ability to maintain an adequate cardiac output will improve Outcome: Progressing   Problem: Health Behavior/Discharge Planning: Goal: Ability to identify and utilize available resources and services will improve Outcome: Progressing Goal: Ability to manage health-related needs will improve Outcome: Progressing   Problem: Metabolic: Goal: Ability to maintain appropriate glucose levels will improve Outcome: Progressing

## 2023-10-13 NOTE — Progress Notes (Signed)
 PHARMACY CONSULT NOTE - ELECTROLYTES  Pharmacy Consult for Electrolyte Monitoring and Replacement   Recent Labs: Height: 5\' 2"  (157.5 cm) Weight: 78.4 kg (172 lb 13.5 oz) IBW/kg (Calculated) : 50.1 Estimated Creatinine Clearance: 62.1 mL/min (by C-G formula based on SCr of 0.91 mg/dL). Potassium (mmol/L)  Date Value  10/13/2023 4.6   Magnesium (mg/dL)  Date Value  16/04/9603 1.9   Calcium (mg/dL)  Date Value  54/03/8118 8.6 (L)   Albumin (g/dL)  Date Value  14/78/2956 3.0 (L)   Phosphorus (mg/dL)  Date Value  21/30/8657 1.3 (L)   Sodium (mmol/L)  Date Value  10/13/2023 135     Assessment  ASAIAH HUNNICUTT is a 63 y.o. female presenting with recurrent DKA . PMH significant for T2DM with recurrent DKA,HTN, Asthma, polysubstance abuse, Anxiety & Depression, Bipolar disorder, CKD Stage 3, SVT,C.Diff colitis. Pharmacy has been consulted to monitor and replace electrolytes.  Diet: CLD MIVF: LR @ 125 mL/hr, sodium bicarb drip Pertinent medications:    Goal of Therapy: Electrolytes WNL  Plan:  500 mg phosphorous po x 5 per NP (each dose contains phosphorus 16 mMol, potassium 2.2 mEq. ) Recheck phosphorous at 2000 (NP order) Check BMP, Mg, Phos with AM labs  Thank you for allowing pharmacy to be a part of this patient's care.  [name], PharmD Clinical Pharmacist 10/13/2023 7:11 AM

## 2023-10-14 DIAGNOSIS — E1110 Type 2 diabetes mellitus with ketoacidosis without coma: Secondary | ICD-10-CM | POA: Diagnosis not present

## 2023-10-14 DIAGNOSIS — N179 Acute kidney failure, unspecified: Secondary | ICD-10-CM | POA: Diagnosis not present

## 2023-10-14 DIAGNOSIS — J9601 Acute respiratory failure with hypoxia: Secondary | ICD-10-CM | POA: Diagnosis not present

## 2023-10-14 LAB — RENAL FUNCTION PANEL
Albumin: 3.3 g/dL — ABNORMAL LOW (ref 3.5–5.0)
Anion gap: 8 (ref 5–15)
BUN: 15 mg/dL (ref 8–23)
CO2: 21 mmol/L — ABNORMAL LOW (ref 22–32)
Calcium: 8.6 mg/dL — ABNORMAL LOW (ref 8.9–10.3)
Chloride: 107 mmol/L (ref 98–111)
Creatinine, Ser: 0.95 mg/dL (ref 0.44–1.00)
GFR, Estimated: >60 mL/min (ref >60)
Glucose, Bld: 200 mg/dL — ABNORMAL HIGH (ref 70–99)
Phosphorus: 3.6 mg/dL (ref 2.5–4.6)
Potassium: 3.4 mmol/L — ABNORMAL LOW (ref 3.5–5.1)
Sodium: 136 mmol/L (ref 135–145)

## 2023-10-14 LAB — GLUCOSE, CAPILLARY
Glucose-Capillary: 169 mg/dL — ABNORMAL HIGH (ref 70–99)
Glucose-Capillary: 186 mg/dL — ABNORMAL HIGH (ref 70–99)
Glucose-Capillary: 258 mg/dL — ABNORMAL HIGH (ref 70–99)
Glucose-Capillary: 356 mg/dL — ABNORMAL HIGH (ref 70–99)

## 2023-10-14 LAB — MAGNESIUM: Magnesium: 1.9 mg/dL (ref 1.7–2.4)

## 2023-10-14 MED ORDER — GABAPENTIN 300 MG PO CAPS: 300.0000 mg | ORAL_CAPSULE | Freq: Three times a day (TID) | ORAL | 0 refills | Status: DC

## 2023-10-14 MED ORDER — INSULIN GLARGINE 100 UNIT/ML SOLOSTAR PEN: 55.0000 [IU] | PEN_INJECTOR | Freq: Every day | SUBCUTANEOUS | 0 refills | Status: AC

## 2023-10-14 NOTE — Progress Notes (Signed)
 1348 Pt d/c [facility] called, pt given taxi voucher. [facility]. IV removed

## 2023-10-14 NOTE — Inpatient Diabetes Management (Signed)
 Inpatient Diabetes Program Recommendations  AACE/ADA: New Consensus Statement on Inpatient Glycemic Control   Target Ranges:  Prepandial:   less than 140 mg/dL      Peak postprandial:   less than 180 mg/dL (1-2 hours)      Critically ill patients:  140 - 180 mg/dL    Latest Reference Range & Units 10/13/23 07:29 10/13/23 11:18 10/13/23 16:12 10/13/23 20:53 10/14/23 00:44 10/14/23 04:32  Glucose-Capillary 70 - 99 mg/dL 409 (H) 811 (H) 914 (H) 158 (H) 258 (H) 169 (H)   Review of Glycemic Control  Diabetes history: DM2 Outpatient Diabetes medications: Lantus 55 units daily, Novolog 15 units TID with meals, Januvia 100 mg daily Current orders for Inpatient glycemic control: Semgle 45 units daily, Novolog 7 units TID with meals, Novolog 0-15 units Q4H  Inpatient Diabetes Program Recommendations:    Insulin: Please consider increasing Semglee to 50 units daily, increasing meal coverage to Novolog 10 units TID with meals, and changing CBGs to AC&HS and Novolog correction to 0-15 units AC&HS.  Thanks, Orlando Penner, RN, MSN, CDCES Diabetes Coordinator Inpatient Diabetes Program [phone] (Team Pager from 8am to 5pm)

## 2023-10-14 NOTE — Evaluation (Signed)
 Physical Therapy Evaluation Patient Details Name: Pamela Lane MRN: [id number] DOB: [birth year] Today's Date: 10/14/2023  History of Present Illness  Pt is 63 y.o. female with insulin-dependent type 2 diabetes, hypertension, asthma, polysubstance abuse, anxiety, depression, bipolar disorder, CKD stage III, history of C. difficile colitis, history of SVT, multiple prior admissions for DKA.  She presents on this admission with DKA and severe metabolic acidosis requiring bicarb supplementation as well as AKI on CKD.  She was initially admitted to the ICU where she was started on insulin drip.  Anion gap resolved and patient was transferred to [facility] service 4/3.   Patient admits to cocaine use as recently as 4/1.  MD assessment includes diabetic ketoacidosis, metabolic acidosis, hypophosphatemia, and polysubstance abuse.   Clinical Impression  Pt was pleasant and motivated to participate during the session and put forth good effort throughout. Pt Ind with all functional tasks per below with good control and stability throughout.  Pt reported no adverse symptoms during the session with SpO2 and HR WNL throughout on room air. No skilled PT needs identified.  Will complete PT orders at this time but will reassess pt pending a change in status upon receipt of new PT orders.           If plan is discharge home, recommend the following: Assist for transportation   Can travel by private vehicle        Equipment Recommendations None recommended by PT  Recommendations for Other Services       Functional Status Assessment Patient has not had a recent decline in their functional status     Precautions / Restrictions Precautions Precautions: None Restrictions Weight Bearing Restrictions Per Provider Order: No      Mobility  Bed Mobility Overal bed mobility: Independent                  Transfers Overall transfer level: Independent                 General transfer comment:  Good control and stability    Ambulation/Gait Ambulation/Gait assistance: Independent Gait Distance (Feet): 200 Feet Assistive device: None Gait Pattern/deviations: WFL(Within Functional Limits) Gait velocity: WFL     General Gait Details: Very good stability including during 180 deg turns, navigating tight spaces, and with start/stops  Stairs Stairs: Yes Stairs assistance: Modified independent (Device/Increase time) Stair Management: Two rails, Alternating pattern Number of Stairs: 4 General stair comments: Good speed, control, and stability throughout  Wheelchair Mobility     Tilt Bed    Modified Rankin (Stroke Patients Only)       Balance Overall balance assessment: No apparent balance deficits (not formally assessed)                                           Pertinent Vitals/Pain Pain Assessment Pain Assessment: No/denies pain    Home Living Family/patient expects to be discharged to:: Private residence Living Arrangements: Spouse/significant other Available Help at Discharge: Family Type of Home: House Home Access: Stairs to enter Entrance Stairs-Rails: Right;Left;Can reach both Secretary/administrator of Steps: 5   Home Layout: One level Home Equipment: None Additional Comments: Pt lives with partner who works 3rd shift    Prior Function Prior Level of Function : Independent/Modified Independent             Mobility Comments: Ind amb without an AD community  distances, no recent fall history ADLs Comments: Indep with IADLs/ADLs     Extremity/Trunk Assessment   Upper Extremity Assessment Upper Extremity Assessment: Overall WFL for tasks assessed    Lower Extremity Assessment Lower Extremity Assessment: Overall WFL for tasks assessed       Communication   Communication Communication: No apparent difficulties    Cognition Arousal: Alert Behavior During Therapy: WFL for tasks assessed/performed   PT - Cognitive  impairments: No apparent impairments                         Following commands: Intact       Cueing       General Comments      Exercises     Assessment/Plan    PT Assessment Patient does not need any further PT services  PT Problem List         PT Treatment Interventions      PT Goals (Current goals can be found in the Care Plan section)  Acute Rehab PT Goals PT Goal Formulation: All assessment and education complete, DC therapy    Frequency       Co-evaluation               AM-PAC PT "6 Clicks" Mobility  Outcome Measure Help needed turning from your back to your side while in a flat bed without using bedrails?: None Help needed moving from lying on your back to sitting on the side of a flat bed without using bedrails?: None Help needed moving to and from a bed to a chair (including a wheelchair)?: None Help needed standing up from a chair using your arms (e.g., wheelchair or bedside chair)?: None Help needed to walk in hospital room?: None Help needed climbing 3-5 steps with a railing? : None 6 Click Score: 24    End of Session Equipment Utilized During Treatment: Gait belt Activity Tolerance: Patient tolerated treatment well Patient left: in chair;with call bell/phone within reach Nurse Communication: Mobility status;Other (comment) (Nsg notified that pt in chair with chair alarm in place but not activated per pt stating that she has been ind with toileting in her room) PT Visit Diagnosis: Difficulty in walking, not elsewhere classified (R26.2)    Time: 1610-9604 PT Time Calculation (min) (ACUTE ONLY): 18 min   Charges:   PT Evaluation $PT Eval Low Complexity: 1 Low   PT General Charges $$ ACUTE PT VISIT: 1 Visit        D. Scott Chandni Gagan PT, DPT 10/14/23, 10:52 AM

## 2023-10-14 NOTE — Hospital Course (Signed)
 Pamela Lane is 63 y.o. female with insulin-dependent type 2 diabetes, hypertension, asthma, polysubstance abuse, anxiety, depression, bipolar disorder, CKD stage IIIa, history of C. difficile colitis, history of SVT, multiple prior admissions for DKA.  She presents on this admission with DKA and severe metabolic acidosis requiring bicarb supplementation as well as AKI on CKD.  She was initially admitted to the ICU where she was started on insulin drip.  Anion gap resolved and patient was transferred to [facility] service 4/3.   Infectious workup was performed but ultimately negative.  UDS was positive for cocaine and patient endorses use on the day of arrival.  We had extensive discussion about the importance of cessation.  We also discussed adherence to insulin and diabetic regimen as well as the importance of close outpatient follow-up with primary care.  She endorses understanding.  Patient reports that she is going to make the major life changes moving forward.   Diabetic ketoacidosis Metabolic acidosis Type 2 diabetes, insulin-dependent, uncontrolled with recurrent DKA - Initially managed on insulin drip in the ICU.  Has been transitioned off now - Anion gap is closed - Continue carb controlled diet -Continue with basal/bolus insulin.  Titrate up as tolerated. - Diabetes educator consulted - Hemoglobin A1c 14.3% -- Extensive discussion regarding the importance of adherence.  Needs close outpatient follow-up with PCP   Sepsis ruled out - Concern for sepsis on arrival.  Received IV fluids and cefepime and vancomycin.  Procalcitonin less than 1, no leukocytosis, has been afebrile.  Blood cultures remain negative, respiratory viral panel negative.  Chest x-ray unremarkable.  No further antibiotics  - Blood cultures negative to date   Hypophosphatemia - Replace as needed   Polysubstance abuse - UDS positive for cocaine - Counseled extensively on the importance of abstinence.  AKI - AKI  resolved.  Patient carries a diagnosis of CKD though her creatinine has resolved to normal with no evidence of GFR dysfunction.  Unclear if she had true CKD or AKI in the past.  Will continue to monitor at future visits with PCP to better clarify this diagnosis

## 2023-10-14 NOTE — Discharge Summary (Signed)
 Physician Discharge Summary   Patient: Pamela Lane MRN: [id number] DOB: Mar 20, 1961  Admit date:     10/12/2023  Discharge date: 10/14/23  Discharge Physician: Debarah Crape   PCP: [facility]   Recommendations at discharge:   Follow up primary care physician for diabetes management and kidney function monitoring.  Discharge Diagnoses: Principal Problem:   DKA (diabetic ketoacidosis) (HCC) Active Problems:   AKI (acute kidney injury) (HCC)   Acute respiratory failure with hypoxia (HCC)  Resolved Problems:   * No resolved hospital problems. [facility] Course: Pamela Lane is 63 y.o. female with insulin-dependent type 2 diabetes, hypertension, asthma, polysubstance abuse, anxiety, depression, bipolar disorder, CKD stage IIIa, history of C. difficile colitis, history of SVT, multiple prior admissions for DKA.  She presents on this admission with DKA and severe metabolic acidosis requiring bicarb supplementation as well as AKI on CKD.  She was initially admitted to the ICU where she was started on insulin drip.  Anion gap resolved and patient was transferred to [facility] service 4/3.   Infectious workup was performed but ultimately negative.  UDS was positive for cocaine and patient endorses use on the day of arrival.  We had extensive discussion about the importance of cessation.  We also discussed adherence to insulin and diabetic regimen as well as the importance of close outpatient follow-up with primary care.  She endorses understanding.  Patient reports that she is going to make the major life changes moving forward.   Diabetic ketoacidosis Metabolic acidosis Type 2 diabetes, insulin-dependent, uncontrolled with recurrent DKA - Initially managed on insulin drip in the ICU.  Has been transitioned off now - Anion gap is closed - Continue carb controlled diet -Continue with basal/bolus insulin.  Titrate up as tolerated. - Diabetes educator consulted -  Hemoglobin A1c 14.3% -- Extensive discussion regarding the importance of adherence.  Needs close outpatient follow-up with PCP   Sepsis ruled out - Concern for sepsis on arrival.  Received IV fluids and cefepime and vancomycin.  Procalcitonin less than 1, no leukocytosis, has been afebrile.  Blood cultures remain negative, respiratory viral panel negative.  Chest x-ray unremarkable.  No further antibiotics  - Blood cultures negative to date   Hypophosphatemia - Replace as needed   Polysubstance abuse - UDS positive for cocaine - Counseled extensively on the importance of abstinence.  AKI - AKI resolved.  Patient carries a diagnosis of CKD though her creatinine has resolved to normal with no evidence of GFR dysfunction.  Unclear if she had true CKD or AKI in the past.  Will continue to monitor at future visits with PCP to better clarify this diagnosis  Neuropathy Continue gabapentin, increase at discharge.  BMI 30 - Outpatient follow up for lifestyle modification and risk factor management   Consultants: PCCM  Disposition: Home Diet recommendation:  Discharge Diet Orders (From admission, onward)     Start     Ordered   10/14/23 0000  Diet general        10/14/23 1309           CARB CONTROL DIET   DISCHARGE MEDICATION: Allergies as of 10/14/2023   No Known Allergies      Medication List     TAKE these medications    Accu-Chek Softclix Lancets lancets Use 3 (three) times daily to check blood sugar   atorvastatin 40 MG tablet Commonly known as: LIPITOR Take 1 tablet (40 mg total) by mouth at bedtime.   Blood  Glucose Monitor System w/Device Kit Use 3 (three) times daily to check blood sugar.   BLOOD GLUCOSE TEST STRIPS Strp Use 3 (three) times daily to check blood sugar.   buPROPion 150 MG 12 hr tablet Commonly known as: WELLBUTRIN SR Take 1 tablet (150 mg total) by mouth 2 (two) times daily.   Dextromethorphan-guaiFENesin 20-200 MG/20ML Liqd Take 10 mLs  by mouth every 4 (four) hours as needed.   diltiazem 120 MG 24 hr capsule Commonly known as: CARDIZEM CD Take 1 capsule (120 mg total) by mouth daily.   gabapentin 300 MG capsule Commonly known as: NEURONTIN Take 1 capsule (300 mg total) by mouth 3 (three) times daily. What changed: when to take this   insulin aspart 100 UNIT/ML FlexPen Commonly known as: NOVOLOG Inject 10 Units into the skin 3 (three) times daily with meals. What changed: how much to take   insulin glargine 100 UNIT/ML Solostar Pen Commonly known as: LANTUS Inject 55 Units into the skin daily.   Insulin Pen Needle 31G X 8 MM Misc Use 5 times daily with Lantus and Novolog   multivitamin with minerals Tabs tablet Take 1 tablet by mouth daily.   omeprazole 20 MG capsule Commonly known as: PRILOSEC Take 1 capsule (20 mg total) by mouth daily.   sitaGLIPtin 100 MG tablet Commonly known as: Januvia Take 1 tablet (100 mg total) by mouth daily.        Discharge Exam: Filed Weights   10/12/23 0158 10/13/23 0355 10/14/23 0715  Weight: 84.8 kg 78.4 kg 75.4 kg   Constitutional:  Normal appearance. Non toxic-appearing.  HENT: Head Normocephalic and atraumatic.  Mucous membranes are moist.  Eyes:  Extraocular intact. Conjunctivae normal. Pupils are equal, round, and reactive to light.  Cardiovascular: Rate and Rhythm: Normal rate and regular rhythm.  Pulmonary: Non labored, symmetric rise of chest wall.  Musculoskeletal:  Normal range of motion.  Skin: warm and dry. not jaundiced.  Neurological: No focal deficit present. alert. Oriented. Psychiatric: Mood and Affect congruent.    Condition at discharge: Stable  The results of significant diagnostics from this hospitalization (including imaging, microbiology, ancillary and laboratory) are listed below for reference.   Imaging Studies: CT HEAD WO CONTRAST ( ) Result Date: 10/12/2023 CLINICAL DATA:  New onset headache EXAM: CT HEAD WITHOUT CONTRAST  TECHNIQUE: Contiguous axial images were obtained from the base of the skull through the vertex without intravenous contrast. RADIATION DOSE REDUCTION: This exam was performed according to the departmental dose-optimization program which includes automated exposure control, adjustment of the mA and/or kV according to patient size and/or use of iterative reconstruction technique. COMPARISON:  03/13/2022 FINDINGS: Brain: No mass,hemorrhage or extra-axial collection. Normal appearance of the parenchyma and CSF spaces. Vascular: No hyperdense vessel or unexpected vascular calcification. Skull: The visualized skull base, calvarium and extracranial soft tissues are normal. Sinuses/Orbits: No fluid levels or advanced mucosal thickening of the visualized paranasal sinuses. No mastoid or middle ear effusion. Normal orbits. Other: None. IMPRESSION: Normal head CT. Electronically Signed   By: Deatra Robinson M.D.   On: 10/12/2023 03:16   DG Chest Port 1 View Result Date: 10/12/2023 CLINICAL DATA:  Sepsis EXAM: PORTABLE CHEST 1 VIEW COMPARISON:  None Available. FINDINGS: The heart size and mediastinal contours are within normal limits. Both lungs are clear. The visualized skeletal structures are unremarkable. IMPRESSION: No active disease. Electronically Signed   By: Helyn Numbers M.D.   On: 10/12/2023 02:22    Microbiology: Results for orders placed or  performed during the hospital encounter of 10/12/23  Resp panel by RT-PCR (RSV, Flu A&B, Covid) Anterior Nasal Swab     Status: None   Collection Time: 10/12/23  2:12 AM   Specimen: Anterior Nasal Swab  Result Value Ref Range Status   SARS Coronavirus 2 by RT PCR NEGATIVE NEGATIVE Final    Comment: (NOTE) SARS-CoV-2 target nucleic acids are NOT DETECTED.  The SARS-CoV-2 RNA is generally detectable in upper respiratory specimens during the acute phase of infection. The lowest concentration of SARS-CoV-2 viral copies this assay can detect is 138 copies/mL. A  negative result does not preclude SARS-Cov-2 infection and should not be used as the sole basis for treatment or other patient management decisions. A negative result may occur with  improper specimen collection/handling, submission of specimen other than nasopharyngeal swab, presence of viral mutation(s) within the areas targeted by this assay, and inadequate number of viral copies(<138 copies/mL). A negative result must be combined with clinical observations, patient history, and epidemiological information. The expected result is Negative.  Fact Sheet for Patients:  BloggerCourse.com  Fact Sheet for Healthcare Providers:  SeriousBroker.it  This test is no t yet approved or cleared by the [place] FDA and  has been authorized for detection and/or diagnosis of SARS-CoV-2 by FDA under an Emergency Use Authorization (EUA). This EUA will remain  in effect (meaning this test can be used) for the duration of the COVID-19 declaration under Section 564(b)(1) of the Act, 21 U.S.C.section 360bbb-3(b)(1), unless the authorization is terminated  or revoked sooner.       Influenza A by PCR NEGATIVE NEGATIVE Final   Influenza B by PCR NEGATIVE NEGATIVE Final    Comment: (NOTE) The Xpert Xpress SARS-CoV-2/FLU/RSV plus assay is intended as an aid in the diagnosis of influenza from Nasopharyngeal swab specimens and should not be used as a sole basis for treatment. Nasal washings and aspirates are unacceptable for Xpert Xpress SARS-CoV-2/FLU/RSV testing.  Fact Sheet for Patients: BloggerCourse.com  Fact Sheet for Healthcare Providers: SeriousBroker.it  This test is not yet approved or cleared by the [place] FDA and has been authorized for detection and/or diagnosis of SARS-CoV-2 by FDA under an Emergency Use Authorization (EUA). This EUA will remain in effect (meaning this test can  be used) for the duration of the COVID-19 declaration under Section 564(b)(1) of the Act, 21 U.S.C. section 360bbb-3(b)(1), unless the authorization is terminated or revoked.     Resp Syncytial Virus by PCR NEGATIVE NEGATIVE Final    Comment: (NOTE) Fact Sheet for Patients: BloggerCourse.com  Fact Sheet for Healthcare Providers: SeriousBroker.it  This test is not yet approved or cleared by the [place] FDA and has been authorized for detection and/or diagnosis of SARS-CoV-2 by FDA under an Emergency Use Authorization (EUA). This EUA will remain in effect (meaning this test can be used) for the duration of the COVID-19 declaration under Section 564(b)(1) of the Act, 21 U.S.C. section 360bbb-3(b)(1), unless the authorization is terminated or revoked.  Performed at [facility], [address] [place], [place] [address]   Blood Culture (routine x 2)     Status: None (Preliminary result)   Collection Time: 10/12/23  2:15 AM   Specimen: BLOOD RIGHT ARM  Result Value Ref Range Status   Specimen Description BLOOD RIGHT ARM  Final   Special Requests   Final    BOTTLES DRAWN AEROBIC AND ANAEROBIC Blood Culture adequate volume   Culture   Final    NO GROWTH  2 DAYS Performed at [facility], [address] [place], [place] [address]    Report Status PENDING  Incomplete  Blood Culture (routine x 2)     Status: None (Preliminary result)   Collection Time: 10/12/23  7:20 AM   Specimen: BLOOD  Result Value Ref Range Status   Specimen Description BLOOD RIGHT HAND  Final   Special Requests   Final    BOTTLES DRAWN AEROBIC ONLY Blood Culture results may not be optimal due to an inadequate volume of blood received in culture bottles   Culture   Final    NO GROWTH 2 DAYS Performed at [facility], [address], [place], [place] [address]    Report Status PENDING  Incomplete  MRSA Next Gen by PCR,  Nasal     Status: None   Collection Time: 10/12/23 10:33 AM   Specimen: Nasal Mucosa; Nasal Swab  Result Value Ref Range Status   MRSA by PCR Next Gen NOT DETECTED NOT DETECTED Final    Comment: (NOTE) The GeneXpert MRSA Assay (FDA approved for NASAL specimens only), is one component of a comprehensive MRSA colonization surveillance program. It is not intended to diagnose MRSA infection nor to guide or monitor treatment for MRSA infections. Test performance is not FDA approved in patients less than 19 years old. Performed at [facility], [address] [place], [place] [address]   MRSA Next Gen by PCR, Nasal     Status: None   Collection Time: 10/12/23 12:34 PM   Specimen: Nasal Mucosa; Nasal Swab  Result Value Ref Range Status   MRSA by PCR Next Gen NOT DETECTED NOT DETECTED Final    Comment: (NOTE) The GeneXpert MRSA Assay (FDA approved for NASAL specimens only), is one component of a comprehensive MRSA colonization surveillance program. It is not intended to diagnose MRSA infection nor to guide or monitor treatment for MRSA infections. Test performance is not FDA approved in patients less than 75 years old. Performed at [facility], [address] [place], [place] [address]     Labs: CBC: Recent Labs  Lab 10/12/23 0215 10/13/23 0323  WBC 17.0* 7.5  NEUTROABS 13.7*  --   HGB 14.6 11.1*  HCT 44.5 30.5*  MCV 91.4 83.6  PLT 348 224   Basic Metabolic Panel: Recent Labs  Lab 10/12/23 0215 10/12/23 0706 10/12/23 0930 10/12/23 1423 10/12/23 1921 10/12/23 2206 10/13/23 0323 10/13/23 2036 10/14/23 0556  NA 127* 135   < > 135 136 135 135  --  136  K 4.9 4.3   < > 4.0 3.7 3.2* 4.6  --  3.4*  CL 95* 104   < > 111 111 111 110  --  107  CO2 <7* 10*   < > 18* 19* 19* 18*  --  21*  GLUCOSE 769* 381*   < > 273* 153* 183* 283*  --  200*  BUN 41* 36*   < > 27* 24* 21 19  --  15  CREATININE 2.06* 1.76*   < > 0.98 0.92 0.87 0.91  --  0.95  CALCIUM  9.0 9.4   < > 8.7* 8.9 8.8* 8.6*  --  8.6*  MG 2.4  --   --   --   --   --  1.9  --  1.9  PHOS  --  2.8  --   --   --   --  1.3* 2.9 3.6   < > = values  in this interval not displayed.   Liver Function Tests: Recent Labs  Lab 10/12/23 0215 10/13/23 0323 10/14/23 0556  AST 13*  --   --   ALT 13  --   --   ALKPHOS 119  --   --   BILITOT 1.6*  --   --   PROT 8.4*  --   --   ALBUMIN 4.6 3.0* 3.3*   CBG: Recent Labs  Lab 10/13/23 2053 10/14/23 0044 10/14/23 0432 10/14/23 0743 10/14/23 1207  GLUCAP 158* 258* 169* 356* 186*    Discharge time spent: 31 minutes.  Signed: Debarah Crape, DO [facility] 10/14/2023

## 2023-10-15 NOTE — Progress Notes (Signed)
  Progress Note   Date: 10/14/2023  Patient Name: Pamela Lane        MRN#: [id number]  Clarification of diagnosis: It appears patient does not have CKD

## 2023-10-17 LAB — CULTURE, BLOOD (ROUTINE X 2)
Culture: NO GROWTH
Culture: NO GROWTH
Special Requests: ADEQUATE

## 2023-10-31 ENCOUNTER — Encounter: Payer: MEDICAID | Attending: Internal Medicine | Admitting: Dietician

## 2023-10-31 ENCOUNTER — Encounter: Payer: Self-pay | Admitting: Dietician

## 2023-10-31 VITALS — Wt 177.7 lb

## 2023-10-31 DIAGNOSIS — E1122 Type 2 diabetes mellitus with diabetic chronic kidney disease: Secondary | ICD-10-CM | POA: Insufficient documentation

## 2023-10-31 DIAGNOSIS — Z794 Long term (current) use of insulin: Secondary | ICD-10-CM | POA: Insufficient documentation

## 2023-10-31 NOTE — Patient Instructions (Signed)
 For each meal, eat a high protein food (meat, fish, eggs, peanut butter, beans, yogurt) + a vegetable + a small portion of a starch (rice, bread, noodles, potatoes, corn) Keep up the walking, great jobe Drink ONLY sugar free/ zero sugar drinks Keep taking insulin  as prescribed Check blood sugar every day first thing in the morning, and again about 2 hours after a meal or whenever Dr recommends.

## 2023-12-11 ENCOUNTER — Other Ambulatory Visit: Payer: Self-pay

## 2023-12-11 ENCOUNTER — Emergency Department: Payer: MEDICAID

## 2023-12-11 ENCOUNTER — Encounter: Payer: Self-pay | Admitting: Emergency Medicine

## 2023-12-11 ENCOUNTER — Inpatient Hospital Stay: Payer: MEDICAID

## 2023-12-11 ENCOUNTER — Inpatient Hospital Stay
Admission: EM | Admit: 2023-12-11 | Discharge: 2023-12-15 | DRG: 208 | Disposition: A | Discharge status: Home / Self Care | Payer: MEDICAID | Attending: Internal Medicine | Admitting: Internal Medicine

## 2023-12-11 DIAGNOSIS — F419 Anxiety disorder, unspecified: Secondary | ICD-10-CM | POA: Diagnosis present

## 2023-12-11 DIAGNOSIS — F259 Schizoaffective disorder, unspecified: Secondary | ICD-10-CM | POA: Diagnosis present

## 2023-12-11 DIAGNOSIS — J45909 Unspecified asthma, uncomplicated: Secondary | ICD-10-CM | POA: Diagnosis present

## 2023-12-11 DIAGNOSIS — G9341 Metabolic encephalopathy: Secondary | ICD-10-CM | POA: Diagnosis not present

## 2023-12-11 DIAGNOSIS — F1420 Cocaine dependence, uncomplicated: Secondary | ICD-10-CM | POA: Diagnosis present

## 2023-12-11 DIAGNOSIS — I959 Hypotension, unspecified: Secondary | ICD-10-CM | POA: Diagnosis not present

## 2023-12-11 DIAGNOSIS — E871 Hypo-osmolality and hyponatremia: Secondary | ICD-10-CM | POA: Diagnosis present

## 2023-12-11 DIAGNOSIS — E878 Other disorders of electrolyte and fluid balance, not elsewhere classified: Secondary | ICD-10-CM | POA: Insufficient documentation

## 2023-12-11 DIAGNOSIS — Z6831 Body mass index (BMI) 31.0-31.9, adult: Secondary | ICD-10-CM | POA: Diagnosis not present

## 2023-12-11 DIAGNOSIS — E1122 Type 2 diabetes mellitus with diabetic chronic kidney disease: Secondary | ICD-10-CM | POA: Diagnosis present

## 2023-12-11 DIAGNOSIS — E0810 Diabetes mellitus due to underlying condition with ketoacidosis without coma: Secondary | ICD-10-CM | POA: Diagnosis not present

## 2023-12-11 DIAGNOSIS — N170 Acute kidney failure with tubular necrosis: Secondary | ICD-10-CM | POA: Diagnosis present

## 2023-12-11 DIAGNOSIS — J9602 Acute respiratory failure with hypercapnia: Secondary | ICD-10-CM | POA: Diagnosis present

## 2023-12-11 DIAGNOSIS — E861 Hypovolemia: Secondary | ICD-10-CM | POA: Diagnosis present

## 2023-12-11 DIAGNOSIS — F1010 Alcohol abuse, uncomplicated: Secondary | ICD-10-CM | POA: Diagnosis present

## 2023-12-11 DIAGNOSIS — J9612 Chronic respiratory failure with hypercapnia: Secondary | ICD-10-CM

## 2023-12-11 DIAGNOSIS — D72829 Elevated white blood cell count, unspecified: Secondary | ICD-10-CM | POA: Diagnosis not present

## 2023-12-11 DIAGNOSIS — F1410 Cocaine abuse, uncomplicated: Secondary | ICD-10-CM | POA: Diagnosis present

## 2023-12-11 DIAGNOSIS — R531 Weakness: Secondary | ICD-10-CM

## 2023-12-11 DIAGNOSIS — Z90710 Acquired absence of both cervix and uterus: Secondary | ICD-10-CM

## 2023-12-11 DIAGNOSIS — R4586 Emotional lability: Secondary | ICD-10-CM | POA: Diagnosis not present

## 2023-12-11 DIAGNOSIS — F17210 Nicotine dependence, cigarettes, uncomplicated: Secondary | ICD-10-CM | POA: Diagnosis present

## 2023-12-11 DIAGNOSIS — E785 Hyperlipidemia, unspecified: Secondary | ICD-10-CM | POA: Diagnosis present

## 2023-12-11 DIAGNOSIS — E669 Obesity, unspecified: Secondary | ICD-10-CM | POA: Insufficient documentation

## 2023-12-11 DIAGNOSIS — J9601 Acute respiratory failure with hypoxia: Principal | ICD-10-CM | POA: Diagnosis present

## 2023-12-11 DIAGNOSIS — E875 Hyperkalemia: Secondary | ICD-10-CM | POA: Diagnosis not present

## 2023-12-11 DIAGNOSIS — E1111 Type 2 diabetes mellitus with ketoacidosis with coma: Secondary | ICD-10-CM | POA: Diagnosis present

## 2023-12-11 DIAGNOSIS — E1110 Type 2 diabetes mellitus with ketoacidosis without coma: Secondary | ICD-10-CM | POA: Diagnosis present

## 2023-12-11 DIAGNOSIS — E66811 Obesity, class 1: Secondary | ICD-10-CM | POA: Diagnosis present

## 2023-12-11 DIAGNOSIS — N1831 Chronic kidney disease, stage 3a: Secondary | ICD-10-CM | POA: Diagnosis present

## 2023-12-11 DIAGNOSIS — R0902 Hypoxemia: Secondary | ICD-10-CM

## 2023-12-11 DIAGNOSIS — G928 Other toxic encephalopathy: Secondary | ICD-10-CM | POA: Diagnosis present

## 2023-12-11 DIAGNOSIS — R579 Shock, unspecified: Secondary | ICD-10-CM | POA: Diagnosis present

## 2023-12-11 DIAGNOSIS — E876 Hypokalemia: Secondary | ICD-10-CM | POA: Diagnosis not present

## 2023-12-11 DIAGNOSIS — F319 Bipolar disorder, unspecified: Secondary | ICD-10-CM | POA: Diagnosis present

## 2023-12-11 DIAGNOSIS — I2489 Other forms of acute ischemic heart disease: Secondary | ICD-10-CM | POA: Diagnosis present

## 2023-12-11 DIAGNOSIS — J9600 Acute respiratory failure, unspecified whether with hypoxia or hypercapnia: Secondary | ICD-10-CM | POA: Diagnosis not present

## 2023-12-11 DIAGNOSIS — J690 Pneumonitis due to inhalation of food and vomit: Secondary | ICD-10-CM | POA: Diagnosis present

## 2023-12-11 DIAGNOSIS — Z794 Long term (current) use of insulin: Secondary | ICD-10-CM

## 2023-12-11 DIAGNOSIS — Z91148 Patient's other noncompliance with medication regimen for other reason: Secondary | ICD-10-CM

## 2023-12-11 DIAGNOSIS — Z1152 Encounter for screening for COVID-19: Secondary | ICD-10-CM | POA: Diagnosis not present

## 2023-12-11 DIAGNOSIS — R7989 Other specified abnormal findings of blood chemistry: Secondary | ICD-10-CM | POA: Diagnosis not present

## 2023-12-11 DIAGNOSIS — I10 Essential (primary) hypertension: Secondary | ICD-10-CM

## 2023-12-11 DIAGNOSIS — Z7984 Long term (current) use of oral hypoglycemic drugs: Secondary | ICD-10-CM

## 2023-12-11 DIAGNOSIS — G929 Unspecified toxic encephalopathy: Secondary | ICD-10-CM | POA: Diagnosis not present

## 2023-12-11 DIAGNOSIS — Z79899 Other long term (current) drug therapy: Secondary | ICD-10-CM

## 2023-12-11 DIAGNOSIS — N179 Acute kidney failure, unspecified: Secondary | ICD-10-CM | POA: Diagnosis not present

## 2023-12-11 DIAGNOSIS — R0602 Shortness of breath: Secondary | ICD-10-CM | POA: Diagnosis present

## 2023-12-11 DIAGNOSIS — T50905A Adverse effect of unspecified drugs, medicaments and biological substances, initial encounter: Secondary | ICD-10-CM | POA: Diagnosis present

## 2023-12-11 DIAGNOSIS — I129 Hypertensive chronic kidney disease with stage 1 through stage 4 chronic kidney disease, or unspecified chronic kidney disease: Secondary | ICD-10-CM | POA: Diagnosis present

## 2023-12-11 DIAGNOSIS — Z8619 Personal history of other infectious and parasitic diseases: Secondary | ICD-10-CM

## 2023-12-11 DIAGNOSIS — D751 Secondary polycythemia: Secondary | ICD-10-CM | POA: Diagnosis present

## 2023-12-11 DIAGNOSIS — Z8249 Family history of ischemic heart disease and other diseases of the circulatory system: Secondary | ICD-10-CM

## 2023-12-11 LAB — URINE DRUG SCREEN, QUALITATIVE (ARMC ONLY)
Amphetamines, Ur Screen: NOT DETECTED
Barbiturates, Ur Screen: NOT DETECTED
Benzodiazepine, Ur Scrn: NOT DETECTED
Cannabinoid 50 Ng, Ur ~~LOC~~: NOT DETECTED
Cocaine Metabolite,Ur ~~LOC~~: POSITIVE — AB
MDMA (Ecstasy)Ur Screen: NOT DETECTED
Methadone Scn, Ur: NOT DETECTED
Opiate, Ur Screen: NOT DETECTED
Phencyclidine (PCP) Ur S: NOT DETECTED
Tricyclic, Ur Screen: NOT DETECTED

## 2023-12-11 LAB — CBC WITH DIFFERENTIAL/PLATELET
Abs Immature Granulocytes: 0.05 10*3/uL (ref 0.00–0.07)
Basophils Absolute: 0.1 10*3/uL (ref 0.0–0.1)
Basophils Relative: 0 %
Eosinophils Absolute: 0.1 10*3/uL (ref 0.0–0.5)
Eosinophils Relative: 1 %
HCT: 52.9 % — ABNORMAL HIGH (ref 36.0–46.0)
Hemoglobin: 16.7 g/dL — ABNORMAL HIGH (ref 12.0–15.0)
Immature Granulocytes: 0 %
Lymphocytes Relative: 15 %
Lymphs Abs: 2 10*3/uL (ref 0.7–4.0)
MCH: 30.3 pg (ref 26.0–34.0)
MCHC: 31.6 g/dL (ref 30.0–36.0)
MCV: 95.8 fL (ref 80.0–100.0)
Monocytes Absolute: 0.8 10*3/uL (ref 0.1–1.0)
Monocytes Relative: 6 %
Neutro Abs: 9.8 10*3/uL — ABNORMAL HIGH (ref 1.7–7.7)
Neutrophils Relative %: 78 %
Platelets: 348 10*3/uL (ref 150–400)
RBC: 5.52 MIL/uL — ABNORMAL HIGH (ref 3.87–5.11)
RDW: 12.6 % (ref 11.5–15.5)
WBC: 12.8 10*3/uL — ABNORMAL HIGH (ref 4.0–10.5)
nRBC: 0 % (ref 0.0–0.2)

## 2023-12-11 LAB — BASIC METABOLIC PANEL WITH GFR
Anion gap: 16 — ABNORMAL HIGH (ref 5–15)
Anion gap: 14 (ref 5–15)
Anion gap: 9 (ref 5–15)
Anion gap: 7 (ref 5–15)
BUN: 30 mg/dL — ABNORMAL HIGH (ref 8–23)
BUN: 31 mg/dL — ABNORMAL HIGH (ref 8–23)
BUN: 28 mg/dL — ABNORMAL HIGH (ref 8–23)
BUN: 27 mg/dL — ABNORMAL HIGH (ref 8–23)
CO2: 15 mmol/L — ABNORMAL LOW (ref 22–32)
CO2: 19 mmol/L — ABNORMAL LOW (ref 22–32)
CO2: 24 mmol/L (ref 22–32)
CO2: 26 mmol/L (ref 22–32)
Calcium: 8.6 mg/dL — ABNORMAL LOW (ref 8.9–10.3)
Calcium: 8.5 mg/dL — ABNORMAL LOW (ref 8.9–10.3)
Calcium: 8.2 mg/dL — ABNORMAL LOW (ref 8.9–10.3)
Calcium: 8.3 mg/dL — ABNORMAL LOW (ref 8.9–10.3)
Chloride: 107 mmol/L (ref 98–111)
Chloride: 106 mmol/L (ref 98–111)
Chloride: 104 mmol/L (ref 98–111)
Chloride: 106 mmol/L (ref 98–111)
Creatinine, Ser: 2.07 mg/dL — ABNORMAL HIGH (ref 0.44–1.00)
Creatinine, Ser: 1.59 mg/dL — ABNORMAL HIGH (ref 0.44–1.00)
Creatinine, Ser: 1.27 mg/dL — ABNORMAL HIGH (ref 0.44–1.00)
Creatinine, Ser: 1.15 mg/dL — ABNORMAL HIGH (ref 0.44–1.00)
GFR, Estimated: 27 mL/min — ABNORMAL LOW (ref >60)
GFR, Estimated: 37 mL/min — ABNORMAL LOW (ref >60)
GFR, Estimated: 48 mL/min — ABNORMAL LOW (ref >60)
GFR, Estimated: 54 mL/min — ABNORMAL LOW (ref >60)
Glucose, Bld: 366 mg/dL — ABNORMAL HIGH (ref 70–99)
Glucose, Bld: 225 mg/dL — ABNORMAL HIGH (ref 70–99)
Glucose, Bld: 266 mg/dL — ABNORMAL HIGH (ref 70–99)
Glucose, Bld: 152 mg/dL — ABNORMAL HIGH (ref 70–99)
Potassium: 4.2 mmol/L (ref 3.5–5.1)
Potassium: 3.6 mmol/L (ref 3.5–5.1)
Potassium: 3.7 mmol/L (ref 3.5–5.1)
Potassium: 3.1 mmol/L — ABNORMAL LOW (ref 3.5–5.1)
Sodium: 138 mmol/L (ref 135–145)
Sodium: 139 mmol/L (ref 135–145)
Sodium: 137 mmol/L (ref 135–145)
Sodium: 139 mmol/L (ref 135–145)

## 2023-12-11 LAB — BLOOD GAS, VENOUS
Acid-base deficit: 26.5 mmol/L — ABNORMAL HIGH (ref 0.0–2.0)
Acid-base deficit: 22.1 mmol/L — ABNORMAL HIGH (ref 0.0–2.0)
Acid-base deficit: 11.6 mmol/L — ABNORMAL HIGH (ref 0.0–2.0)
Acid-base deficit: 8.1 mmol/L — ABNORMAL HIGH (ref 0.0–2.0)
Acid-base deficit: 3 mmol/L — ABNORMAL HIGH (ref 0.0–2.0)
Bicarbonate: 5.4 mmol/L — ABNORMAL LOW (ref 20.0–28.0)
Bicarbonate: 9.7 mmol/L — ABNORMAL LOW (ref 20.0–28.0)
Bicarbonate: 14.5 mmol/L — ABNORMAL LOW (ref 20.0–28.0)
Bicarbonate: 17 mmol/L — ABNORMAL LOW (ref 20.0–28.0)
Bicarbonate: 21.1 mmol/L (ref 20.0–28.0)
O2 Saturation: 68.8 %
O2 Saturation: 93.5 %
O2 Saturation: 82 %
O2 Saturation: 78.4 %
O2 Saturation: 73.3 %
Patient temperature: 37
Patient temperature: 37
Patient temperature: 37
Patient temperature: 37
Patient temperature: 37
pCO2, Ven: 27 mmHg — ABNORMAL LOW (ref 44–60)
pCO2, Ven: 44 mmHg (ref 44–60)
pCO2, Ven: 33 mmHg — ABNORMAL LOW (ref 44–60)
pCO2, Ven: 33 mmHg — ABNORMAL LOW (ref 44–60)
pCO2, Ven: 34 mmHg — ABNORMAL LOW (ref 44–60)
pH, Ven: <6.95 — CL (ref 7.25–7.43)
pH, Ven: 6.95 — CL (ref 7.25–7.43)
pH, Ven: 7.25 (ref 7.25–7.43)
pH, Ven: 7.32 (ref 7.25–7.43)
pH, Ven: 7.4 (ref 7.25–7.43)
pO2, Ven: 46 mmHg — ABNORMAL HIGH (ref 32–45)
pO2, Ven: 76 mmHg — ABNORMAL HIGH (ref 32–45)
pO2, Ven: 45 mmHg (ref 32–45)
pO2, Ven: 43 mmHg (ref 32–45)
pO2, Ven: 37 mmHg (ref 32–45)

## 2023-12-11 LAB — URINALYSIS, W/ REFLEX TO CULTURE (INFECTION SUSPECTED)
Bacteria, UA: NONE SEEN
Bilirubin Urine: NEGATIVE
Glucose, UA: >=500 mg/dL — AB
Hgb urine dipstick: NEGATIVE
Ketones, ur: 80 mg/dL — AB
Leukocytes,Ua: NEGATIVE
Nitrite: NEGATIVE
Protein, ur: 100 mg/dL — AB
Specific Gravity, Urine: 1.02 (ref 1.005–1.030)
pH: 8 (ref 5.0–8.0)

## 2023-12-11 LAB — COMPREHENSIVE METABOLIC PANEL WITH GFR
ALT: 15 U/L (ref 0–44)
ALT: 17 U/L (ref 0–44)
AST: 15 U/L (ref 15–41)
AST: 20 U/L (ref 15–41)
Albumin: 4.7 g/dL (ref 3.5–5.0)
Albumin: 4 g/dL (ref 3.5–5.0)
Alkaline Phosphatase: 130 U/L — ABNORMAL HIGH (ref 38–126)
Alkaline Phosphatase: 119 U/L (ref 38–126)
Anion gap: 19 — ABNORMAL HIGH (ref 5–15)
BUN: 27 mg/dL — ABNORMAL HIGH (ref 8–23)
BUN: 30 mg/dL — ABNORMAL HIGH (ref 8–23)
CO2: <7 mmol/L — ABNORMAL LOW (ref 22–32)
CO2: 14 mmol/L — ABNORMAL LOW (ref 22–32)
Calcium: 9.5 mg/dL (ref 8.9–10.3)
Calcium: 9 mg/dL (ref 8.9–10.3)
Chloride: 99 mmol/L (ref 98–111)
Chloride: 104 mmol/L (ref 98–111)
Creatinine, Ser: 2.01 mg/dL — ABNORMAL HIGH (ref 0.44–1.00)
Creatinine, Ser: 2.37 mg/dL — ABNORMAL HIGH (ref 0.44–1.00)
GFR, Estimated: 28 mL/min — ABNORMAL LOW (ref >60)
GFR, Estimated: 23 mL/min — ABNORMAL LOW (ref >60)
Glucose, Bld: 683 mg/dL (ref 70–99)
Glucose, Bld: 550 mg/dL (ref 70–99)
Potassium: 7 mmol/L (ref 3.5–5.1)
Potassium: 4.1 mmol/L (ref 3.5–5.1)
Sodium: 132 mmol/L — ABNORMAL LOW (ref 135–145)
Sodium: 137 mmol/L (ref 135–145)
Total Bilirubin: 1.4 mg/dL — ABNORMAL HIGH (ref 0.0–1.2)
Total Bilirubin: 1.8 mg/dL — ABNORMAL HIGH (ref 0.0–1.2)
Total Protein: 9 g/dL — ABNORMAL HIGH (ref 6.5–8.1)
Total Protein: 7.8 g/dL (ref 6.5–8.1)

## 2023-12-11 LAB — GLUCOSE, CAPILLARY
Glucose-Capillary: >600 mg/dL (ref 70–99)
Glucose-Capillary: 552 mg/dL (ref 70–99)
Glucose-Capillary: 571 mg/dL (ref 70–99)
Glucose-Capillary: 541 mg/dL (ref 70–99)
Glucose-Capillary: 482 mg/dL — ABNORMAL HIGH (ref 70–99)
Glucose-Capillary: 344 mg/dL — ABNORMAL HIGH (ref 70–99)
Glucose-Capillary: 344 mg/dL — ABNORMAL HIGH (ref 70–99)
Glucose-Capillary: 320 mg/dL — ABNORMAL HIGH (ref 70–99)
Glucose-Capillary: 251 mg/dL — ABNORMAL HIGH (ref 70–99)
Glucose-Capillary: 223 mg/dL — ABNORMAL HIGH (ref 70–99)
Glucose-Capillary: 218 mg/dL — ABNORMAL HIGH (ref 70–99)
Glucose-Capillary: 232 mg/dL — ABNORMAL HIGH (ref 70–99)
Glucose-Capillary: 226 mg/dL — ABNORMAL HIGH (ref 70–99)
Glucose-Capillary: 246 mg/dL — ABNORMAL HIGH (ref 70–99)
Glucose-Capillary: 221 mg/dL — ABNORMAL HIGH (ref 70–99)
Glucose-Capillary: 196 mg/dL — ABNORMAL HIGH (ref 70–99)
Glucose-Capillary: 162 mg/dL — ABNORMAL HIGH (ref 70–99)
Glucose-Capillary: 145 mg/dL — ABNORMAL HIGH (ref 70–99)

## 2023-12-11 LAB — CBG MONITORING, ED
Glucose-Capillary: >600 mg/dL (ref 70–99)
Glucose-Capillary: >600 mg/dL (ref 70–99)
Glucose-Capillary: >600 mg/dL (ref 70–99)

## 2023-12-11 LAB — CBC
HCT: 44 % (ref 36.0–46.0)
Hemoglobin: 14.7 g/dL (ref 12.0–15.0)
MCH: 30.9 pg (ref 26.0–34.0)
MCHC: 33.4 g/dL (ref 30.0–36.0)
MCV: 92.4 fL (ref 80.0–100.0)
Platelets: 347 10*3/uL (ref 150–400)
RBC: 4.76 MIL/uL (ref 3.87–5.11)
RDW: 12.7 % (ref 11.5–15.5)
WBC: 25 10*3/uL — ABNORMAL HIGH (ref 4.0–10.5)
nRBC: 0 % (ref 0.0–0.2)

## 2023-12-11 LAB — BETA-HYDROXYBUTYRIC ACID
Beta-Hydroxybutyric Acid: >8 mmol/L — ABNORMAL HIGH (ref 0.05–0.27)
Beta-Hydroxybutyric Acid: 5.91 mmol/L — ABNORMAL HIGH (ref 0.05–0.27)
Beta-Hydroxybutyric Acid: 3.69 mmol/L — ABNORMAL HIGH (ref 0.05–0.27)
Beta-Hydroxybutyric Acid: 1.74 mmol/L — ABNORMAL HIGH (ref 0.05–0.27)
Beta-Hydroxybutyric Acid: 0.19 mmol/L (ref 0.05–0.27)

## 2023-12-11 LAB — PROTIME-INR
INR: 1.2 (ref 0.8–1.2)
Prothrombin Time: 14.9 s (ref 11.4–15.2)

## 2023-12-11 LAB — RESP PANEL BY RT-PCR (RSV, FLU A&B, COVID)  RVPGX2
Influenza A by PCR: NEGATIVE
Influenza B by PCR: NEGATIVE
Resp Syncytial Virus by PCR: NEGATIVE
SARS Coronavirus 2 by RT PCR: NEGATIVE

## 2023-12-11 LAB — BRAIN NATRIURETIC PEPTIDE: B Natriuretic Peptide: 114.2 pg/mL — ABNORMAL HIGH (ref 0.0–100.0)

## 2023-12-11 LAB — TROPONIN I (HIGH SENSITIVITY)
Troponin I (High Sensitivity): 16 ng/L (ref <18)
Troponin I (High Sensitivity): 45 ng/L — ABNORMAL HIGH (ref <18)

## 2023-12-11 LAB — STREP PNEUMONIAE URINARY ANTIGEN: Strep Pneumo Urinary Antigen: NEGATIVE

## 2023-12-11 LAB — LIPASE, BLOOD: Lipase: 23 U/L (ref 11–51)

## 2023-12-11 LAB — MRSA NEXT GEN BY PCR, NASAL: MRSA by PCR Next Gen: NOT DETECTED

## 2023-12-11 LAB — LACTIC ACID, PLASMA
Lactic Acid, Venous: 3.9 mmol/L (ref 0.5–1.9)
Lactic Acid, Venous: 2.1 mmol/L (ref 0.5–1.9)

## 2023-12-11 MED ORDER — ETOMIDATE 2 MG/ML IV SOLN
INTRAVENOUS | Status: AC | PRN
  Administered 2023-12-11: 20 mg via INTRAVENOUS

## 2023-12-11 MED ORDER — INSULIN ASPART 100 UNIT/ML IV SOLN
10.0000 [IU] | Freq: Once | INTRAVENOUS | Status: AC
  Administered 2023-12-11: 10 [IU] via INTRAVENOUS
  Filled 2023-12-11: qty 0.1

## 2023-12-11 MED ORDER — STERILE WATER FOR INJECTION IJ SOLN
INTRAMUSCULAR | Status: AC
  Filled 2023-12-11: qty 10

## 2023-12-11 MED ORDER — POTASSIUM CHLORIDE 10 MEQ/100ML IV SOLN
10.0000 meq | INTRAVENOUS | Status: AC
  Administered 2023-12-11 (×2): 10 meq via INTRAVENOUS
  Filled 2023-12-11 (×2): qty 100

## 2023-12-11 MED ORDER — VECURONIUM BROMIDE 10 MG IV SOLR: 10.0000 mg | Freq: Once | INTRAVENOUS | Status: AC

## 2023-12-11 MED ORDER — PROPOFOL 1000 MG/100ML IV EMUL
0.0000 ug/kg/min | INTRAVENOUS | Status: DC
  Administered 2023-12-11: 60 ug/kg/min via INTRAVENOUS
  Administered 2023-12-11: 80 ug/kg/min via INTRAVENOUS
  Administered 2023-12-11 (×3): 60 ug/kg/min via INTRAVENOUS
  Administered 2023-12-11: 5 ug/kg/min via INTRAVENOUS
  Administered 2023-12-12: 60 ug/kg/min via INTRAVENOUS
  Administered 2023-12-12: 65 ug/kg/min via INTRAVENOUS
  Filled 2023-12-11 (×7): qty 100

## 2023-12-11 MED ORDER — SODIUM CHLORIDE 0.9% FLUSH
10.0000 mL | Freq: Two times a day (BID) | INTRAVENOUS | Status: DC
  Administered 2023-12-11 – 2023-12-12 (×2): 10 mL
  Administered 2023-12-12: 30 mL
  Administered 2023-12-13 – 2023-12-15 (×4): 10 mL

## 2023-12-11 MED ORDER — PROPOFOL 1000 MG/100ML IV EMUL: 0.0000 ug/kg/min | INTRAVENOUS | Status: DC

## 2023-12-11 MED ORDER — SUCCINYLCHOLINE CHLORIDE 20 MG/ML IJ SOLN
INTRAMUSCULAR | Status: AC | PRN
  Administered 2023-12-11: 200 mg via INTRAVENOUS

## 2023-12-11 MED ORDER — METOPROLOL TARTRATE 5 MG/5ML IV SOLN
5.0000 mg | Freq: Once | INTRAVENOUS | Status: AC
  Administered 2023-12-11: 5 mg via INTRAVENOUS

## 2023-12-11 MED ORDER — ORAL CARE MOUTH RINSE: 15.0000 mL | OROMUCOSAL | Status: DC | PRN

## 2023-12-11 MED ORDER — MIDAZOLAM HCL 2 MG/2ML IJ SOLN
INTRAMUSCULAR | Status: AC | PRN
  Administered 2023-12-11: 2 mg via INTRAVENOUS

## 2023-12-11 MED ORDER — SODIUM BICARBONATE 8.4 % IV SOLN
50.0000 meq | Freq: Once | INTRAVENOUS | Status: AC
  Administered 2023-12-11: 50 meq via INTRAVENOUS

## 2023-12-11 MED ORDER — ADENOSINE 6 MG/2ML IV SOLN
6.0000 mg | Freq: Once | INTRAVENOUS | Status: AC
  Administered 2023-12-11: 6 mg via INTRAVENOUS

## 2023-12-11 MED ORDER — METOPROLOL TARTRATE 5 MG/5ML IV SOLN: 2.5000 mg | INTRAVENOUS | Status: AC

## 2023-12-11 MED ORDER — ADENOSINE 6 MG/2ML IV SOLN
INTRAVENOUS | Status: AC
  Filled 2023-12-11: qty 2

## 2023-12-11 MED ORDER — INSULIN REGULAR(HUMAN) IN NACL 100-0.9 UT/100ML-% IV SOLN
INTRAVENOUS | Status: DC
  Administered 2023-12-11 (×2): 7 [IU]/h via INTRAVENOUS
  Filled 2023-12-11: qty 100

## 2023-12-11 MED ORDER — MIDAZOLAM HCL 2 MG/2ML IJ SOLN
2.0000 mg | INTRAMUSCULAR | Status: DC | PRN
  Administered 2023-12-11 (×2): 2 mg via INTRAVENOUS
  Filled 2023-12-11 (×2): qty 2

## 2023-12-11 MED ORDER — ADENOSINE 12 MG/4ML IV SOLN
INTRAVENOUS | Status: AC
  Administered 2023-12-11: 6 mg via INTRAVENOUS
  Filled 2023-12-11: qty 4

## 2023-12-11 MED ORDER — SODIUM CHLORIDE 0.9% FLUSH: 10.0000 mL | INTRAVENOUS | Status: DC | PRN

## 2023-12-11 MED ORDER — PANTOPRAZOLE SODIUM 40 MG IV SOLR
40.0000 mg | Freq: Every day | INTRAVENOUS | Status: DC
  Administered 2023-12-11 – 2023-12-12 (×2): 40 mg via INTRAVENOUS
  Filled 2023-12-11 (×2): qty 10

## 2023-12-11 MED ORDER — VECURONIUM BROMIDE 10 MG IV SOLR
INTRAVENOUS | Status: AC
  Administered 2023-12-11: 10 mg via INTRAVENOUS
  Filled 2023-12-11: qty 10

## 2023-12-11 MED ORDER — HEPARIN SODIUM (PORCINE) 5000 UNIT/ML IJ SOLN
5000.0000 [IU] | Freq: Three times a day (TID) | INTRAMUSCULAR | Status: DC
  Administered 2023-12-11 – 2023-12-13 (×7): 5000 [IU] via SUBCUTANEOUS
  Filled 2023-12-11 (×6): qty 1

## 2023-12-11 MED ORDER — SODIUM BICARBONATE 8.4 % IV SOLN
50.0000 meq | Freq: Once | INTRAVENOUS | Status: AC
  Administered 2023-12-11: 50 meq via INTRAVENOUS
  Filled 2023-12-11: qty 50

## 2023-12-11 MED ORDER — ORAL CARE MOUTH RINSE
15.0000 mL | OROMUCOSAL | Status: DC
  Administered 2023-12-11 – 2023-12-12 (×16): 15 mL via OROMUCOSAL

## 2023-12-11 MED ORDER — STERILE WATER FOR INJECTION IV SOLN
INTRAVENOUS | Status: DC
  Filled 2023-12-11 (×2): qty 150
  Filled 2023-12-11 (×2): qty 1000
  Filled 2023-12-11: qty 150

## 2023-12-11 MED ORDER — CHLORHEXIDINE GLUCONATE CLOTH 2 % EX PADS
6.0000 | MEDICATED_PAD | Freq: Every day | CUTANEOUS | Status: DC
  Administered 2023-12-11 – 2023-12-15 (×4): 6 via TOPICAL

## 2023-12-11 MED ORDER — CALCIUM GLUCONATE 10 % IV SOLN
1.0000 g | Freq: Once | INTRAVENOUS | Status: AC
  Administered 2023-12-11: 1 g via INTRAVENOUS
  Filled 2023-12-11: qty 10

## 2023-12-11 MED ORDER — DEXTROSE 50 % IV SOLN: 0.0000 mL | INTRAVENOUS | Status: DC | PRN

## 2023-12-11 MED ORDER — FENTANYL 2500MCG IN NS 250ML (10MCG/ML) PREMIX INFUSION
0.0000 ug/h | INTRAVENOUS | Status: DC
  Administered 2023-12-11: 25 ug/h via INTRAVENOUS
  Filled 2023-12-11 (×2): qty 250

## 2023-12-11 MED ORDER — POLYETHYLENE GLYCOL 3350 17 G PO PACK: 17.0000 g | PACK | Freq: Every day | ORAL | Status: DC | PRN

## 2023-12-11 MED ORDER — CALCIUM CHLORIDE 10 % IV SOLN
INTRAVENOUS | Status: AC
  Filled 2023-12-11: qty 10

## 2023-12-11 MED ORDER — PHENYLEPHRINE 80 MCG/ML (10ML) SYRINGE FOR IV PUSH (FOR BLOOD PRESSURE SUPPORT)
PREFILLED_SYRINGE | INTRAVENOUS | Status: AC
  Administered 2023-12-11: 200 ug
  Filled 2023-12-11: qty 10

## 2023-12-11 MED ORDER — SODIUM CHLORIDE 0.9 % IV SOLN
500.0000 mg | INTRAVENOUS | Status: DC
  Administered 2023-12-11: 500 mg via INTRAVENOUS
  Filled 2023-12-11 (×3): qty 5

## 2023-12-11 MED ORDER — SODIUM BICARBONATE 4.2 % IV SOLN
50.0000 meq | Freq: Once | INTRAVENOUS | Status: DC
  Filled 2023-12-11: qty 100

## 2023-12-11 MED ORDER — LACTATED RINGERS IV BOLUS
20.0000 mL/kg | Freq: Once | INTRAVENOUS | Status: AC
  Administered 2023-12-11: 1566 mL via INTRAVENOUS

## 2023-12-11 MED ORDER — FAMOTIDINE 20 MG PO TABS: 20.0000 mg | ORAL_TABLET | Freq: Two times a day (BID) | ORAL | Status: DC

## 2023-12-11 MED ORDER — PROPOFOL 10 MG/ML IV BOLUS
INTRAVENOUS | Status: AC | PRN
  Administered 2023-12-11: 10 mg via INTRAVENOUS

## 2023-12-11 MED ORDER — DOCUSATE SODIUM 50 MG/5ML PO LIQD: 100.0000 mg | Freq: Two times a day (BID) | ORAL | Status: DC | PRN

## 2023-12-11 MED ORDER — CALCIUM GLUCONATE-NACL 1-0.675 GM/50ML-% IV SOLN
INTRAVENOUS | Status: AC
  Filled 2023-12-11: qty 50

## 2023-12-11 MED ORDER — SODIUM CHLORIDE 0.9 % IV SOLN
2.0000 g | INTRAVENOUS | Status: DC
  Administered 2023-12-11: 2 g via INTRAVENOUS
  Filled 2023-12-11 (×2): qty 20

## 2023-12-11 MED ORDER — INSULIN ASPART 100 UNIT/ML IJ SOLN
10.0000 [IU] | Freq: Once | INTRAMUSCULAR | Status: AC
  Administered 2023-12-11: 10 [IU] via INTRAVENOUS

## 2023-12-11 MED ORDER — SODIUM CHLORIDE 0.9 % IV SOLN
250.0000 mL | INTRAVENOUS | Status: DC
  Administered 2023-12-11: 250 mL via INTRAVENOUS

## 2023-12-11 MED ORDER — VASOPRESSIN 20 UNITS/100 ML INFUSION FOR SHOCK
0.0300 [IU]/min | INTRAVENOUS | Status: DC
Start: 2023-12-11 — End: 2023-12-11
  Filled 2023-12-11: qty 100

## 2023-12-11 MED ORDER — SODIUM BICARBONATE 8.4 % IV SOLN
INTRAVENOUS | Status: AC
  Administered 2023-12-11: 50 meq via INTRAVENOUS
  Filled 2023-12-11: qty 50

## 2023-12-11 MED ORDER — METOPROLOL TARTRATE 5 MG/5ML IV SOLN
INTRAVENOUS | Status: AC
  Administered 2023-12-11: 2.5 mg via INTRAVENOUS
  Filled 2023-12-11: qty 5

## 2023-12-11 MED ORDER — SODIUM CHLORIDE 0.9 % IV SOLN: INTRAVENOUS | Status: DC

## 2023-12-11 MED ORDER — DEXTROSE IN LACTATED RINGERS 5 % IV SOLN
INTRAVENOUS | Status: DC
Start: 2023-12-11 — End: 2023-12-12

## 2023-12-11 MED ORDER — POTASSIUM CHLORIDE 10 MEQ/100ML IV SOLN
10.0000 meq | INTRAVENOUS | Status: AC
  Administered 2023-12-11 – 2023-12-12 (×5): 10 meq via INTRAVENOUS
  Filled 2023-12-11 (×5): qty 100

## 2023-12-11 NOTE — ED Triage Notes (Signed)
 Pt arrived via [facility] from home, with reports of shortness of breath, NRB started by FD, c/o sob x several hours, lethargic, BP systolic 90s, HR in 190s,   125mg  IM Solumedrol  Pt placed on CPAP  1 Albuterol  and 2 Duonebs

## 2023-12-11 NOTE — ED Notes (Signed)
 Pt was given another 200 joules sync

## 2023-12-11 NOTE — Progress Notes (Signed)
 eLink Physician-Brief Progress Note Patient Name: Pamela Lane DOB: 23-Feb-1961 MRN: [id number]   Date of Service  12/11/2023  HPI/Events of Note  63 y.o. female brought to the ED via EMS from home with a chief complaint of respiratory distress.  Patient with a history of asthma not on home oxygen, diabetes with history of DKA, hypertension, substance use disorder who reports shortness of breath in the setting of  cocaine use complicated by SVT requiring multiple defibrillation and  push dose medications.  Now, the patient has stable vital on fentanyl , propofol , and insulin  infusion.  Results show severe metabolic acidemia with acute kidney injury, hyperkalemia, and leukocytosis, and polycythemia  eICU Interventions  Maintain phenylephrine  vasopressin as needed.    Aggressively replete the patient with crystalloid infusion, insulin , and DKA protocol  Would likely benefit from some bicarb given severely reduced pH.  Repeat set of labs at this time.  Avoid metoprolol  pushes, utilize verapamil as needed instead  Consider empiric antibiotics and cultures  DVT prophylaxis with heparin  GI prophylaxis with famotidine      Intervention Category Evaluation Type: New Patient Evaluation  Pamela Lane 12/11/2023, 6:33 AM

## 2023-12-11 NOTE — ED Notes (Signed)
 Pt was given 200j sync defib

## 2023-12-11 NOTE — Progress Notes (Signed)
 Transported pt to ICU bed #7 on vent with no issues to report. RT will cont to follow.

## 2023-12-11 NOTE — ED Notes (Signed)
 Pty was given another 200j sync defib with Nellie Banas, NP at the bedside

## 2023-12-11 NOTE — ED Notes (Signed)
 Pt was sync 200 joules with Nellie Banas, NP at the bedside

## 2023-12-11 NOTE — ED Provider Notes (Signed)
 [facility] Provider Note    Event Date/Time   First MD Initiated Contact with Patient 12/11/23 [phone]     (approximate)   History   Respiratory Distress   HPI  Level V caveat: Limited by lethargy and distress  Pamela Lane is a 63 y.o. female brought to the ED via EMS from home with a chief complaint of respiratory distress.  Patient with a history of asthma not on home oxygen, diabetes with history of DKA, hypertension, substance use disorder who reports shortness of breath for several hours.  Received 125 mg IM Solu-Medrol  by EMS.  Arrives to the ED on CPAP with silent chest, fatigue and lethargy.  Rest of history is not obtainable secondary to patient's severe distress.     Past Medical History   Past Medical History:  Diagnosis Date   Asthma    Clostridium difficile colitis    Diabetes mellitus without complication (HCC)    Hypertension    Mental disorder    pt reports 'I have all of them mental disorders'     Active Problem List   Patient Active Problem List   Diagnosis Date Noted   Acute hypoxic on chronic hypercapnic respiratory failure (HCC) 12/11/2023   Acute respiratory failure with hypoxia (HCC) 10/12/2023   Sepsis (HCC) 07/28/2023   Nausea, vomiting and diarrhea 08/21/2022   Chronic kidney disease, stage 3a (HCC) 08/21/2022   Polysubstance abuse (HCC) 08/21/2022   Anxiety and depression 08/21/2022   UTI (urinary tract infection) 08/21/2022   Severe sepsis (HCC) 03/13/2022   SIRS (systemic inflammatory response syndrome) (HCC) 01/11/2021   Hypotension 01/11/2021   Abdominal pain 01/11/2021   Chest pain 01/11/2021   HLD (hyperlipidemia) 01/11/2021   Asthma 01/11/2021   GERD (gastroesophageal reflux disease) 01/11/2021   Hyperkalemia 01/11/2021   Alcohol abuse 01/11/2021   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   Acute metabolic encephalopathy 01/11/2021   Cocaine abuse (HCC)    Acute  respiratory distress    SVT (supraventricular tachycardia) (HCC)    Leukocytosis    Hypokalemia    Hypophosphatemia    DKA (diabetic ketoacidosis) (HCC) 08/27/2020   Tobacco abuse 10/04/2019   DKA, type 2 (HCC) 10/02/2019   Essential hypertension 10/02/2019   Bipolar disorder (HCC) 01/17/2019   Acute pancreatitis 05/15/2016   AKI (acute kidney injury) (HCC)    Diabetic ketoacidosis without coma associated with type 2 diabetes mellitus (HCC)    Hyperglycemia due to type 2 diabetes mellitus (HCC) 01/13/2016   Acute UTI 01/13/2016   DKA (diabetic ketoacidosis) (HCC) 01/08/2016   Hyponatremia 01/08/2016   Dehydration 01/08/2016   Urinary tract infection 01/08/2016   Upper abdominal pain 01/08/2016   Nausea & vomiting 01/08/2016     Past Surgical History   Past Surgical History:  Procedure Laterality Date   ABDOMINAL HYSTERECTOMY       Home Medications   Prior to Admission medications   Medication Sig Start Date End Date Taking? Authorizing Provider  Accu-Chek Softclix Lancets lancets Use 3 (three) times daily to check blood sugar 09/16/23   Aisha Hove, MD  atorvastatin  (LIPITOR) 40 MG tablet Take 1 tablet (40 mg total) by mouth at bedtime. 09/16/23 10/16/23  Aisha Hove, MD  Blood Glucose Monitoring Suppl (BLOOD GLUCOSE MONITOR SYSTEM) w/Device KIT Use 3 (three) times daily to check blood sugar. 09/16/23   Aisha Hove, MD  buPROPion  (WELLBUTRIN  SR) 150 MG 12 hr tablet Take 1 tablet (150 mg total) by  mouth 2 (two) times daily. 09/16/23 10/16/23  Aisha Hove, MD  Dextromethorphan -guaiFENesin  20-200 MG/20ML LIQD Take 10 mLs by mouth every 4 (four) hours as needed. 09/16/23   Aisha Hove, MD  diltiazem  (CARDIZEM  CD) 120 MG 24 hr capsule Take 120 mg by mouth daily. 09/16/23   [provider]  gabapentin  (NEURONTIN ) 300 MG capsule Take 1 capsule (300 mg total) by mouth 3 (three) times daily. 10/14/23 11/13/23  Dezii, Alexandra, DO  Glucose  Blood (BLOOD GLUCOSE TEST STRIPS) STRP Use 3 (three) times daily to check blood sugar. 09/16/23   Aisha Hove, MD  insulin  aspart (NOVOLOG ) 100 UNIT/ML FlexPen Inject 10 Units into the skin 3 (three) times daily with meals. Patient not taking: Reported on 10/31/2023 09/16/23   Aisha Hove, MD  insulin  glargine (LANTUS ) 100 UNIT/ML Solostar Pen Inject 55 Units into the skin daily. 10/14/23 11/13/23  Dezii, Alexandra, DO  Insulin  Pen Needle 31G X 8 MM MISC Use 5 times daily with Lantus  and Novolog  09/16/23   Sreeram, Narendranath, MD  Multiple Vitamin (MULTIVITAMIN WITH MINERALS) TABS tablet Take 1 tablet by mouth daily. 09/16/23   Aisha Hove, MD  omeprazole  (PRILOSEC ) 20 MG capsule Take 1 capsule (20 mg total) by mouth daily. 09/16/23 10/16/23  Aisha Hove, MD  sitaGLIPtin  (JANUVIA ) 100 MG tablet Take 1 tablet (100 mg total) by mouth daily. 09/16/23 10/16/23  Aisha Hove, MD     Allergies  Patient has no known allergies.   Family History   Family History  Problem Relation Age of Onset   Hypertension Mother      Physical Exam  Triage Vital Signs: ED Triage Vitals  Encounter Vitals Group     BP 12/11/23 0355 133/83     Systolic BP Percentile --      Diastolic BP Percentile --      Pulse Rate 12/11/23 0346 98     Resp 12/11/23 0346 (!) 30     Temp --      Temp src --      SpO2 12/11/23 0355 97 %     Weight 12/11/23 0354 172 lb 9.6 oz (78.3 kg)     Height 12/11/23 0354 5\' 2"  (1.575 m)     Head Circumference --      Peak Flow --      Pain Score --      Pain Loc --      Pain Education --      Exclude from Growth Chart --     Updated Vital Signs: BP (!) 145/95   Pulse (!) 167   Temp 98.6 F (37 C) (Rectal)   Resp (!) 27   Ht 5\' 2"  (1.575 m)   Wt 78.3 kg   SpO2 99%   BMI 31.57 kg/m    General: Awake, severe distress.  CV:  Tachycardic.  Good peripheral perfusion.  Resp:  Increased effort.  Silent chest. Abd:  Nontender.  No distention.   Other:  Bilateral calves are supple and nontender.   ED Results / Procedures / Treatments  Labs (all labs ordered are listed, but only abnormal results are displayed) Labs Reviewed  BLOOD GAS, VENOUS - Abnormal; Notable for the following components:      Result Value   pH, Ven <6.95 (*)    pCO2, Ven 27 (*)    pO2, Ven 46 (*)    Bicarbonate 5.4 (*)    Acid-base deficit 26.5 (*)    All other components within normal limits  COMPREHENSIVE  METABOLIC PANEL WITH GFR - Abnormal; Notable for the following components:   Sodium 132 (*)    Potassium 7.0 (*)    CO2 <7 (*)    Glucose, Bld 683 (*)    BUN 27 (*)    Creatinine, Ser 2.01 (*)    Total Protein 9.0 (*)    Alkaline Phosphatase 130 (*)    Total Bilirubin 1.4 (*)    GFR, Estimated 28 (*)    All other components within normal limits  CBC WITH DIFFERENTIAL/PLATELET - Abnormal; Notable for the following components:   WBC 12.8 (*)    RBC 5.52 (*)    Hemoglobin 16.7 (*)    HCT 52.9 (*)    Neutro Abs 9.8 (*)    All other components within normal limits  URINALYSIS, W/ REFLEX TO CULTURE (INFECTION SUSPECTED) - Abnormal; Notable for the following components:   Color, Urine STRAW (*)    APPearance CLEAR (*)    Glucose, UA >=500 (*)    Ketones, ur 80 (*)    Protein, ur 100 (*)    All other components within normal limits  BRAIN NATRIURETIC PEPTIDE - Abnormal; Notable for the following components:   B Natriuretic Peptide 114.2 (*)    All other components within normal limits  CBG MONITORING, ED - Abnormal; Notable for the following components:   Glucose-Capillary >600 (*)    All other components within normal limits  RESP PANEL BY RT-PCR (RSV, FLU A&B, COVID)  [id number]  CULTURE, BLOOD (ROUTINE X 2)  CULTURE, BLOOD (ROUTINE X 2)  URINE CULTURE  MRSA NEXT GEN BY PCR, NASAL  LACTIC ACID, PLASMA  LACTIC ACID, PLASMA  URINE DRUG SCREEN, QUALITATIVE ([facility] ONLY)  LIPASE, BLOOD  BASIC METABOLIC PANEL WITH GFR  LEGIONELLA PNEUMOPHILA  SEROGP 1 UR AG  STREP PNEUMONIAE URINARY ANTIGEN  TROPONIN I (HIGH SENSITIVITY)  TROPONIN I (HIGH SENSITIVITY)     EKG  ED ECG REPORT I, Darnell Stimson J, the attending physician, personally viewed and interpreted this ECG.   Date: 12/11/2023  EKG Time: 0351  Rate: 178  Rhythm: sinus tachycardia  Axis: RAD  Intervals:none  ST&T Change: Nonspecific    RADIOLOGY I have independently visualized and interpreted patient's imaging study as well as noted the radiology interpretation:  Chest x-ray: No acute cardiopulmonary process  Official radiology report(s): DG Abdomen 1 View Result Date: 12/11/2023 CLINICAL DATA:  63 year old female with shortness of breath, intubated. Enteric tube placed. EXAM: ABDOMEN - 1 VIEW COMPARISON:  CT Abdomen and Pelvis 09/12/2023. FINDINGS: Portable AP supine view at 0443 hours. Enteric tube terminates in the stomach, side hole the level of the gastric fundus. Visible bowel-gas pattern within normal limits. Visible lungs are clear. Left chest pacer/resuscitation pads. No acute osseous abnormality identified. IMPRESSION: Satisfactory enteric tube placement into the stomach. Electronically Signed   By: Marlise Simpers M.D.   On: 12/11/2023 05:28   DG Chest Port 1 View Result Date: 12/11/2023 CLINICAL DATA:  63 year old female with shortness of breath, intubated. EXAM: PORTABLE CHEST 1 VIEW COMPARISON:  Portable chest 10/12/2023 and earlier. FINDINGS: Portable AP supine view at 0442 hours. Endotracheal tube visible in the midline and appears to terminates between the clavicles and carina, other external artifact obscuring the tip. Left chest pacer/resuscitation pads in place. An enteric tube courses to the left upper quadrant in the side hole is visible at the level of the gastric fundus. Large lung volumes. Allowing for portable technique the lungs are clear. Normal cardiac size and mediastinal  contours. Mild to moderate gaseous distension of the stomach. No acute osseous  abnormality identified. IMPRESSION: 1. Satisfactory endotracheal and enteric tube placement. 2. No acute cardiopulmonary abnormality. Electronically Signed   By: Marlise Simpers M.D.   On: 12/11/2023 05:27     PROCEDURES:  Critical Care performed: Yes, see critical care procedure note(s) CRITICAL CARE Performed by: Norlene Beavers   Total critical care time: 60 minutes  Critical care time was exclusive of separately billable procedures and treating other patients.  Critical care was necessary to treat or prevent imminent or life-threatening deterioration.  Critical care was time spent personally by me on the following activities: development of treatment plan with patient and/or surrogate as well as nursing, discussions with consultants, evaluation of patient's response to treatment, examination of patient, obtaining history from patient or surrogate, ordering and performing treatments and interventions, ordering and review of laboratory studies, ordering and review of radiographic studies, pulse oximetry and re-evaluation of patient's condition.   Aaron Aas1-3 Lead EKG Interpretation  Performed by: Norlene Beavers, MD Authorized by: Norlene Beavers, MD     Interpretation: abnormal     ECG rate:  189   ECG rate assessment: tachycardic     Rhythm: sinus tachycardia     Ectopy: none     Conduction: normal   Comments:     Patient placed on cardiac monitor to evaluate for arrhythmias Procedure Name: Intubation Date/Time: 12/11/2023 4:03 AM  Performed by: Norlene Beavers, MDPre-anesthesia Checklist: Patient identified, Emergency Drugs available, Suction available and Patient being monitored Oxygen Delivery Method: Non-rebreather mask Preoxygenation: Pre-oxygenation with 100% oxygen Induction Type: Rapid sequence Ventilation: Mask ventilation without difficulty Laryngoscope Size: Glidescope and 3 Tube size: 7.5 mm Number of attempts: 1 Airway Equipment and Method: Rigid stylet       MEDICATIONS ORDERED  IN ED: Medications  etomidate  (AMIDATE ) injection (20 mg Intravenous Given 12/11/23 0350)  succinylcholine (ANECTINE) injection (200 mg Intravenous Given 12/11/23 0351)  propofol  (DIPRIVAN ) 1000 MG/100ML infusion (40 mcg/kg/min  78.3 kg Intravenous Rate/Dose Change 12/11/23 0452)  propofol  (DIPRIVAN ) 10 mg/mL bolus/IV push (10 mg Intravenous Given 12/11/23 0421)  fentaNYL  in NS (27mcg/ml) infusion-PREMIX (100 mcg/hr Intravenous Rate/Dose Change 12/11/23 0534)  midazolam  (VERSED ) injection (2 mg Intravenous Given 12/11/23 0414)  calcium  gluconate inj 10% (1 g) URGENT USE ONLY! (has no administration in time range)  adenosine  (ADENOCARD ) 6 MG/2ML injection (has no administration in time range)  adenosine  (ADENOCARD ) 12 MG/4ML injection (has no administration in time range)  midazolam  (VERSED ) injection 2 mg (2 mg Intravenous Given 12/11/23 0530)  docusate (COLACE) 50 MG/5ML liquid 100 mg (has no administration in time range)  polyethylene glycol (MIRALAX  / GLYCOLAX ) packet 17 g (has no administration in time range)  heparin  injection 5,000 Units (has no administration in time range)  famotidine  (PEPCID ) tablet 20 mg (has no administration in time range)  0.9 %  sodium chloride  infusion (has no administration in time range)  vasopressin (PITRESSIN) 20 Units in 100 mL (0.2 unit/mL) infusion-*FOR SHOCK* (has no administration in time range)  sodium bicarbonate  injection 50 mEq (has no administration in time range)  adenosine  (ADENOCARD ) 6 MG/2ML injection 6 mg (6 mg Intravenous Given 12/11/23 0507)  insulin  aspart (novoLOG ) injection 10 Units (10 Units Intravenous Given 12/11/23 0531)  sodium bicarbonate  injection 50 mEq (50 mEq Intravenous Given 12/11/23 0529)     IMPRESSION / MDM / ASSESSMENT AND PLAN / ED COURSE  I reviewed the triage vital signs and the nursing  notes.                             63 year old female presenting in respiratory failure with hypoxia. Differential includes, but is  not limited to, viral syndrome, bronchitis including COPD exacerbation, pneumonia, reactive airway disease including asthma, CHF including exacerbation with or without pulmonary/interstitial edema, pneumothorax, ACS, thoracic trauma, and pulmonary embolism.  I personally reviewed patient's records and noted a hospitalization 10/12/2023 for asthma, DKA, respiratory failure with hypoxia, AKI, substance use disorder.  Patient's presentation is most consistent with acute presentation with potential threat to life or bodily function.  The patient is on the cardiac monitor to evaluate for evidence of arrhythmia and/or significant heart rate changes.  Patient arrives fatigued with silent chest.  Intubated immediately for airway protection.  Met sepsis criteria per EMS.  Will obtain ED sepsis workup, chest x-ray.  Anticipate hospitalization.  Clinical Course as of 12/11/23 0537  Sun Dec 11, 2023  0358 Spoke with CCU intensivist [place] who will evaluate patient in the emergency department for admission. [JS]  0506 Heart rate 176, EKG now shows SVT.  CCU intensivist at bedside who plans to give adenosine .  Chart review shows patient with cocaine abuse, history of DKA.  FSBS greater than 600. [JS]  [id number] Patient hyperkalemic with potassium of 7, glucose 683 with anion gap unable to calculate.  AKI creatinine 2 which is elevated from prior.  CCU intensivist ordering hyperkalemia cocktail.  Adenosine  worked temporarily, patient was cardioverted by CCU intensivist. [JS]    Clinical Course User Index [JS] Norlene Beavers, MD     FINAL CLINICAL IMPRESSION(S) / ED DIAGNOSES   Final diagnoses:  Acute respiratory failure with hypoxia (HCC)  Hypoxia  Diabetic ketoacidosis with coma associated with type 2 diabetes mellitus (HCC)  AKI (acute kidney injury) (HCC)  Hyperkalemia     Rx / DC Orders   ED Discharge Orders     None        Note:  This document was prepared using Dragon voice recognition software  and may include unintentional dictation errors.   Adeola Dennen J, MD 12/11/23 [phone]

## 2023-12-11 NOTE — Progress Notes (Signed)
 Patient arrived to unit; was transferred to ICU bed;  unfortunately wet from previously cardioversion.  NP needed to place CVC line asap.

## 2023-12-11 NOTE — ED Notes (Signed)
 Pt given another 200 joules sync defib

## 2023-12-11 NOTE — ED Notes (Signed)
 Pamela Lane, Hospitalist gave Epinepphrine 1 push dose

## 2023-12-11 NOTE — ED Notes (Signed)
Metoprolol 2.5 mg IV given

## 2023-12-11 NOTE — ED Notes (Signed)
 Fall bundle currently in place.

## 2023-12-11 NOTE — Consult Note (Signed)
 PHARMACY CONSULT NOTE - ELECTROLYTES  Pharmacy Consult for Electrolyte Monitoring and Replacement   Recent Labs: Height: 5\' 2"  (157.5 cm) Weight: 78 kg (172 lb) IBW/kg (Calculated) : 50.1 Estimated Creatinine Clearance: 49.1 mL/min (A) (by C-G formula based on SCr of 1.15 mg/dL (H)). Potassium (mmol/L)  Date Value  12/11/2023 3.1 (L)   Magnesium  (mg/dL)  Date Value  78/29/5621 1.9   Calcium  (mg/dL)  Date Value  30/86/5784 8.3 (L)   Albumin (g/dL)  Date Value  69/62/9528 4.0   Phosphorus (mg/dL)  Date Value  41/32/4401 3.6   Sodium (mmol/L)  Date Value  12/11/2023 139    Assessment  Pamela Lane is a 63 y.o. female presenting with shortness of breath. PMH significant for DKA, HTN, HLD, asthma, anxiety, depression, and polysubstance abuse. Pharmacy has been consulted to monitor and replace electrolytes.  Diet: NPO MIVF: NS @ 125 mL/hr Pertinent medications: insulin  gtt  Goal of Therapy: Electrolytes WNL  Plan:  K 3.1: patient still on insulin  gtt. Ordered KCl 10 mEq IV x 5. Continue BMP q4h checks and replace electrolytes as indicated Check BMP, Mg, Phos with AM labs  Thank you for allowing pharmacy to be a part of this patient's care.  Coretta Dexter, PharmD, [facility] 12/11/2023 10:43 PM

## 2023-12-11 NOTE — Inpatient Diabetes Management (Signed)
 Inpatient Diabetes Program Recommendations  AACE/ADA: New Consensus Statement on Inpatient Glycemic Control (2015)  Target Ranges:  Prepandial:   less than 140 mg/dL      Peak postprandial:   less than 180 mg/dL (1-2 hours)      Critically ill patients:  140 - 180 mg/dL   Lab Results  Component Value Date   GLUCAP 571 (HH) 12/11/2023   HGBA1C 14.3 (H) 10/12/2023    History: DM   Home DM Meds: Novolog  10 units TID with meals                             Lantus  55 units daily                             Januvia  100 mg daily   Current Orders: IV Insulin  Drip   Pt well known to the Inpatient Diabetes Team Has been counseled by the Diabetes RN Sept 2024 and again January 2025 Continues to use Cocaine Pt went for Diabetes Self-Management Education 09/21/2023  Will follow while hospitalized.  Thank you, Rashawn Rolon E. Daylin Eads, RN, MSN, CDCES  Diabetes Coordinator Inpatient Glycemic Control Team Team Pager [phone] (8am-5pm) 12/11/2023 8:44 AM

## 2023-12-11 NOTE — ED Notes (Signed)
 1st of blood cultures drawn by [facility]

## 2023-12-11 NOTE — Consult Note (Signed)
 PHARMACY CONSULT NOTE - ELECTROLYTES  Pharmacy Consult for Electrolyte Monitoring and Replacement   Recent Labs: Height: 5\' 2"  (157.5 cm) Weight: 78 kg (172 lb) IBW/kg (Calculated) : 50.1 Estimated Creatinine Clearance: 28.1 mL/min (A) (by C-G formula based on SCr of 2.01 mg/dL (H)). Potassium (mmol/L)  Date Value  12/11/2023 7.0 (HH)   Magnesium  (mg/dL)  Date Value  40/98/1191 1.9   Calcium  (mg/dL)  Date Value  47/82/9562 9.5   Albumin (g/dL)  Date Value  13/02/6577 4.7   Phosphorus (mg/dL)  Date Value  46/96/2952 3.6   Sodium (mmol/L)  Date Value  12/11/2023 132 (L)    Assessment  Pamela Lane is a 63 y.o. female presenting with shortness of breath. PMH significant for DKA, HTN, HLD, asthma, anxiety, depression, and polysubstance abuse. Pharmacy has been consulted to monitor and replace electrolytes.  Diet: NPO MIVF: NS @ 125 mL/hr Pertinent medications: insulin  gtt  Goal of Therapy: Electrolytes WNL  Plan:  K 7.0: patient currently asymptomatic and on insulin  gtt. Will defer further pharmacological therapy currently Continue BMP q4h checks and replace electrolytes as indicated Check BMP, Mg, Phos with AM labs  Thank you for allowing pharmacy to be a part of this patient's care.  Camya Haydon A Skyy Mcknight, PharmD Clinical Pharmacist 12/11/2023 7:05 AM

## 2023-12-11 NOTE — ED Notes (Signed)
 Called CCMD spoke with Sonya to place the pt on monitoring.

## 2023-12-11 NOTE — Plan of Care (Incomplete)
 The patient remains on AR-ICU as of time of writing. The patient remains intubated and sedated. The patient continues to receive infusions of insulin , propofol , fentanyl , bicarbonate, and D5LR. The patienet has a LIJ CVC with CVP monitoring. Foley catheter, OGT, and PIVs remain in place and in use. Q 1 hour CBG checks; insulin  titratrtion per [employer]. Patient's admission profile completed (as much as was possible) overnight by this RN. Oral care performed q 2 hours per protocol. 2200 hours BMP resulted with K+ = 3.1; Pharmacist Elana Grayer notified by this RN of the results so as to obtain K replacement; [name], NP also notified of the results (anion gap closed, beta-hydroxy WNL).    Problem: Education: Goal: Ability to describe self-care measures that may prevent or decrease complications (Diabetes Survival Skills Education) will improve Outcome: Progressing Goal: Individualized Educational Video(s) Outcome: Progressing   Problem: Coping: Goal: Ability to adjust to condition or change in health will improve Outcome: Progressing   Problem: Fluid Volume: Goal: Ability to maintain a balanced intake and output will improve Outcome: Progressing   Problem: Health Behavior/Discharge Planning: Goal: Ability to identify and utilize available resources and services will improve Outcome: Progressing Goal: Ability to manage health-related needs will improve Outcome: Progressing   Problem: Metabolic: Goal: Ability to maintain appropriate glucose levels will improve Outcome: Progressing   Problem: Nutritional: Goal: Maintenance of adequate nutrition will improve Outcome: Progressing Goal: Progress toward achieving an optimal weight will improve Outcome: Progressing   Problem: Skin Integrity: Goal: Risk for impaired skin integrity will decrease Outcome: Progressing   Problem: Tissue Perfusion: Goal: Adequacy of tissue perfusion will improve Outcome: Progressing   Problem:  Education: Goal: Ability to describe self-care measures that may prevent or decrease complications (Diabetes Survival Skills Education) will improve Outcome: Progressing Goal: Individualized Educational Video(s) Outcome: Progressing   Problem: Cardiac: Goal: Ability to maintain an adequate cardiac output will improve Outcome: Progressing   Problem: Health Behavior/Discharge Planning: Goal: Ability to identify and utilize available resources and services will improve Outcome: Progressing Goal: Ability to manage health-related needs will improve Outcome: Progressing   Problem: Fluid Volume: Goal: Ability to achieve a balanced intake and output will improve Outcome: Progressing   Problem: Metabolic: Goal: Ability to maintain appropriate glucose levels will improve Outcome: Progressing   Problem: Nutritional: Goal: Maintenance of adequate nutrition will improve Outcome: Progressing Goal: Maintenance of adequate weight for body size and type will improve Outcome: Progressing   Problem: Respiratory: Goal: Will regain and/or maintain adequate ventilation Outcome: Progressing   Problem: Urinary Elimination: Goal: Ability to achieve and maintain adequate renal perfusion and functioning will improve Outcome: Progressing   Problem: Education: Goal: Knowledge of General Education information will improve Description: Including pain rating scale, medication(s)/side effects and non-pharmacologic comfort measures Outcome: Progressing   Problem: Health Behavior/Discharge Planning: Goal: Ability to manage health-related needs will improve Outcome: Progressing   Problem: Clinical Measurements: Goal: Ability to maintain clinical measurements within normal limits will improve Outcome: Progressing Goal: Will remain free from infection Outcome: Progressing Goal: Diagnostic test results will improve Outcome: Progressing Goal: Respiratory complications will improve Outcome:  Progressing Goal: Cardiovascular complication will be avoided Outcome: Progressing   Problem: Activity: Goal: Risk for activity intolerance will decrease Outcome: Progressing   Problem: Nutrition: Goal: Adequate nutrition will be maintained Outcome: Progressing   Problem: Coping: Goal: Level of anxiety will decrease Outcome: Progressing   Problem: Elimination: Goal: Will not experience complications related to bowel motility Outcome: Progressing Goal: Will not experience complications related to  urinary retention Outcome: Progressing   Problem: Pain Managment: Goal: General experience of comfort will improve and/or be controlled Outcome: Progressing   Problem: Safety: Goal: Ability to remain free from injury will improve Outcome: Progressing   Problem: Skin Integrity: Goal: Risk for impaired skin integrity will decrease Outcome: Progressing   Problem: Activity: Goal: Ability to tolerate increased activity will improve Outcome: Progressing   Problem: Respiratory: Goal: Ability to maintain a clear airway and adequate ventilation will improve Outcome: Progressing   Problem: Role Relationship: Goal: Method of communication will improve Outcome: Progressing

## 2023-12-11 NOTE — H&P (Signed)
 NAME:  Pamela Lane, MRN:  [id number], DOB:  July 24, 1960, LOS: 0 ADMISSION DATE:  12/11/2023, CONSULTATION DATE:  12/11/23 REFERRING MD: Meredith Stalls CHIEF COMPLAINT:  shortness of breath   HPI  64 y.o female with significant PMH of uncontrolled diabetes mellitus, recurrent DKA, hypertension, hyperlipidemia, asthma, anxiety and depression, bipolar disorder, CKD stage III, SVT, C. difficile colitis and polysubstance abuse who presented to the ED with chief complaints of breath and hypoxia with a glucose reading of high per EMS    ED Course: Initial vital signs showed HR of 180 beats/minute, BP133/83 mm Hg, the RR 30 breaths/minute, and the oxygen saturation 98% on vent and a temperature of 98.78F (37C).  Pertinent Labs/Diagnostics Findings: Na+/ K+: 132/7.0 Glucose: 683 BUN/Cr.:27/2.01  WBC: 12.8 K/L  UDS +cocaine UA+ketones Beta Hydroxy elevated COVID PCR: Negative,  VBG: pO2 46; pCO2 27; pH 6.95;  HCO3 5.4, %O2 Sat 68.8.  CXR> see results Medication administered in the EX:[id number] went into svt sustaining rates upto 190"s with unstable BP, received Adenosine  6mg , Defibrillated x 10 and received 2 amps of sodium Bicarb, calcum gluconate, IV insulin  10 units x 2 and IV Metoprolol  with rate improvement to NSR in the 90'-100s. Disposition:ICU  Past Medical History  uncontrolled diabetes mellitus, recurrent DKA, hypertension, hyperlipidemia, asthma, anxiety and depression, bipolar disorder, CKD stage III, SVT, C. difficile colitis and polysubstance abuse   Significant Hospital Events   6/1: Admit to ICU with Acute respiratory failure requiring intubation and found to have DKA in the setting of medication non-compliance and cocaine use.   Consults:  Diabetes coordinator   Procedures:  12/11/23: Intubation 12/11/23:Left internal jugular central Line placement  Significant Diagnostic Tests:  6/1: Chest Xray> IMPRESSION: 1. Satisfactory endotracheal and enteric tube placement. 2. No acute  cardiopulmonary abnormality. Interim History / Subjective:      Micro Data:  6/1: SARS-CoV-2 PCR> negative 6/1: Influenza PCR> negative 6/1: Blood culture x2> 6/1: MRSA PCR>>  6/1: Strep pneumo urinary antigen> 6/1: Legionella urinary antigen>  Antimicrobials:  Azithromycin 6/1> Ceftriaxone  6/1>  OBJECTIVE   Blood pressure 117/68, pulse (!) 105, temperature 98.6 F (37 C), temperature source Rectal, resp. rate 18, height 5\' 2"  (1.575 m), weight 78 kg, SpO2 99%.    Vent Mode: PRVC FiO2 (%):  [70 %] 70 % Set Rate:  [18 bmp] 18 bmp Vt Set:  [400 mL] 400 mL PEEP:  [5 cmH20] 5 cmH20  No intake or output data in the 24 hours ending 12/11/23 0616 Filed Weights   12/11/23 0354 12/11/23 0607  Weight: 78.3 kg 78 kg     Physical Examination  GENERAL: 64 year-old critically ill patient lying in the bed intubated and sedated EYES: PEERLA. No scleral icterus. Extraocular muscles intact.  HEENT: Head atraumatic, normocephalic. Oropharynx and nasopharynx clear.  NECK:  No JVD, supple  LUNGS: Decreased breath sounds bilaterally.  No use of accessory muscles of respiration.  CARDIOVASCULAR: S1, S2 normal. No murmurs, rubs, or gallops.  ABDOMEN: Soft, NTND EXTREMITIES: No swelling or erythema.  Capillary refill < 3 seconds in all extremities. Pulses palpable distally. NEUROLOGIC: The patient is intubated and sedated . No focal neurological deficit appreciated. Cranial nerves are intact.  SKIN: No obvious rash, lesion, or ulcer. Warm to touch Labs/imaging that I havepersonally reviewed  (right click and "Reselect all SmartList Selections" daily)     Labs   CBC: Recent Labs  Lab 12/11/23 0349  WBC 12.8*  NEUTROABS 9.8*  HGB 16.7*  HCT 52.9*  MCV 95.8  PLT 348    Basic Metabolic Panel: Recent Labs  Lab 12/11/23 0349  NA 132*  K 7.0*  CL 99  CO2 <7*  GLUCOSE 683*  BUN 27*  CREATININE 2.01*  CALCIUM  9.5   GFR: Estimated Creatinine Clearance: 28.1 mL/min (A) (by C-G  formula based on SCr of 2.01 mg/dL (H)). Recent Labs  Lab 12/11/23 0349  WBC 12.8*    Liver Function Tests: Recent Labs  Lab 12/11/23 0349  AST 15  ALT 15  ALKPHOS 130*  BILITOT 1.4*  PROT 9.0*  ALBUMIN 4.7   No results for input(s): "LIPASE", "AMYLASE" in the last 168 hours. No results for input(s): "AMMONIA" in the last 168 hours.  ABG    Component Value Date/Time   PHART 7.34 (L) 10/08/2020 0805   PCO2ART 40 10/08/2020 0805   PO2ART 78 (L) 10/08/2020 0805   HCO3 5.4 (L) 12/11/2023 0430   TCO2 15 01/12/2016 2315   ACIDBASEDEF 26.5 (H) 12/11/2023 0430   O2SAT 68.8 12/11/2023 0430     Coagulation Profile: No results for input(s): "INR", "PROTIME" in the last 168 hours.  Cardiac Enzymes: No results for input(s): "CKTOTAL", "CKMB", "CKMBINDEX", "TROPONINI" in the last 168 hours.  HbA1C: Hgb A1c MFr Bld  Date/Time Value Ref Range Status  10/12/2023 07:07 AM 14.3 (H) 4.8 - 5.6 % Final    Comment:    (NOTE) Pre diabetes:          5.7%-6.4%  Diabetes:              >6.4%  Glycemic control for   <7.0% adults with diabetes   04/11/2023 03:28 AM >15.5 (H) 4.8 - 5.6 % Final    Comment:    (NOTE)         Prediabetes: 5.7 - 6.4         Diabetes: >6.4         Glycemic control for adults with diabetes: <7.0     CBG: Recent Labs  Lab 12/11/23 0457 12/11/23 0543 12/11/23 0603  GLUCAP >600* >600* >600*    Review of Systems:   Unable to be obtained secondary to the patient's intubated and sedated status.   Past Medical History  She,  has a past medical history of Asthma, Clostridium difficile colitis, Diabetes mellitus without complication (HCC), Hypertension, and Mental disorder.   Surgical History    Past Surgical History:  Procedure Laterality Date   ABDOMINAL HYSTERECTOMY       Social History   reports that she has been smoking cigarettes. She has never used smokeless tobacco. She reports that she does not currently use alcohol. She reports current  drug use. Drugs: Marijuana and Cocaine.   Family History   Her family history includes Hypertension in her mother.   Allergies No Known Allergies   Home Medications  Prior to Admission medications   Medication Sig Start Date End Date Taking? Authorizing Provider  Accu-Chek Softclix Lancets lancets Use 3 (three) times daily to check blood sugar 09/16/23   Aisha Hove, MD  atorvastatin  (LIPITOR) 40 MG tablet Take 1 tablet (40 mg total) by mouth at bedtime. 09/16/23 10/16/23  Aisha Hove, MD  Blood Glucose Monitoring Suppl (BLOOD GLUCOSE MONITOR SYSTEM) w/Device KIT Use 3 (three) times daily to check blood sugar. 09/16/23   Aisha Hove, MD  buPROPion  (WELLBUTRIN  SR) 150 MG 12 hr tablet Take 1 tablet (150 mg total) by mouth 2 (two) times daily. 09/16/23 10/16/23  Aisha Hove, MD  Dextromethorphan -guaiFENesin   20-200 MG/20ML LIQD Take 10 mLs by mouth every 4 (four) hours as needed. 09/16/23   Aisha Hove, MD  diltiazem  (CARDIZEM  CD) 120 MG 24 hr capsule Take 120 mg by mouth daily. 09/16/23   [provider]  gabapentin  (NEURONTIN ) 300 MG capsule Take 1 capsule (300 mg total) by mouth 3 (three) times daily. 10/14/23 11/13/23  Dezii, Alexandra, DO  Glucose Blood (BLOOD GLUCOSE TEST STRIPS) STRP Use 3 (three) times daily to check blood sugar. 09/16/23   Aisha Hove, MD  insulin  aspart (NOVOLOG ) 100 UNIT/ML FlexPen Inject 10 Units into the skin 3 (three) times daily with meals. Patient not taking: Reported on 10/31/2023 09/16/23   Aisha Hove, MD  insulin  glargine (LANTUS ) 100 UNIT/ML Solostar Pen Inject 55 Units into the skin daily. 10/14/23 11/13/23  Dezii, Alexandra, DO  Insulin  Pen Needle 31G X 8 MM MISC Use 5 times daily with Lantus  and Novolog  09/16/23   Sreeram, Narendranath, MD  Multiple Vitamin (MULTIVITAMIN WITH MINERALS) TABS tablet Take 1 tablet by mouth daily. 09/16/23   Aisha Hove, MD  omeprazole  (PRILOSEC ) 20 MG capsule Take 1  capsule (20 mg total) by mouth daily. 09/16/23 10/16/23  Aisha Hove, MD  sitaGLIPtin  (JANUVIA ) 100 MG tablet Take 1 tablet (100 mg total) by mouth daily. 09/16/23 10/16/23  Aisha Hove, MD  Scheduled Meds:  adenosine        adenosine        calcium  gluconate  1 g Intravenous Once   famotidine   20 mg Per Tube BID   heparin   5,000 Units Subcutaneous Q8H   metoprolol  tartrate  5 mg Intravenous Once   sodium bicarbonate   50 mEq Intravenous Once   Continuous Infusions:  sodium chloride      sodium chloride      dextrose  5% lactated ringers      fentaNYL  infusion INTRAVENOUS 100 mcg/hr (12/11/23 0534)   insulin  7 Units/hr (12/11/23 3664)   lactated ringers      propofol  (DIPRIVAN ) infusion 40 mcg/kg/min (12/11/23 0452)   vasopressin     PRN Meds:.adenosine , adenosine , dextrose , docusate, etomidate , midazolam , midazolam , midazolam , polyethylene glycol, propofol , succinylcholine  Active Hospital Problem list   See systems below  Assessment & Plan:  #Acute Hypoxic Respiratory Failure in the setting of severe Metabolic Acidosis -full vent support -Wean PEEP and FiO2 for sats greater than 90% -pulmonary hygiene -VAP Bundle -SBT once parameters met -Follow chest x-ray, ABG prn.  -prn fentanyl , versed  for RASS -1   #Diabetic Ketoacidosis ?Noncompliance and Cocaine use Hx of controlled T2DM: Home medsLantus 25 units bid, Januvia  100 mg daily, Novolog  15 units tid with meals, Xigduo  04-999 mg daily -UA negative, Blood  and urine Cultures for infection pending -EKG  shows no ischemia -Lipase for pancreatitis -Received Insulin  (regular) 0.1u/kg (~10 units x 10) IV x1. Continue Insulin  drip, DKA protocol -Keep NPO -Glucose: q1h to titrate insulin  -Lab monitoring: q2-4h BMP+Phosphorus -Aggressive volume repletion  -Diabetes coordinator consult    #Acute Kidney Injury #Severe Anion Gap Metabolic Acidosis with Lactic Acidosis #Hyponatremia- likely pseudohyponatremia in the  setting of DKA correct for hyperglycemia #Hyperkalemia corrected with sodium bicarb, calcium  gluconate and Insulin  -Monitor I&O's / urinary output -Follow BMP -Ensure adequate renal perfusion -Avoid nephrotoxic agents as able -Give additional 2 amps of sodium Bicarb for total of 4 amps, will start Bicarb gtt if no improvent -Replace electrolytes as indicated   Leukocytosis likely reactive in the setting of DKA No obvious source of infection, chest x-ray and UA negative -F/u cultures, trend lactic/ PCT -Monitor  WBC/ fever curve -Start Ceftriaxone  and Azithromycin empirically pending sepsis work up [employer] for MAP goal >65 -Strict I/O's   #Essential hypertension #HLD (hyperlipidemia) -Lipitor -IV hydralazine  as needed -DIlt-XR, lisinopril    #Hx SVT (supraventricular tachycardia): HR  --> 190 sustaining with hemodynamic instability On Dilt-XR S/p cardioversion with minimal improvement therefore Metoprolol  was given 5mg  with immediate return to NSR.   #Polysubstance abuse, tobacco abuse, cocaine abuse, alcohol abuse and marijuana: -Needs counseling about importance of quitting substance -Nicotine  patch -CIWA protocol -UDS pending   #Bipolar disorder and Anxiety and depression -Continue home medications    Best practice:  Diet:  NPO Pain/Anxiety/Delirium protocol (if indicated): Yes (RASS goal -1) VAP protocol (if indicated): Yes DVT prophylaxis: Subcutaneous Heparin  GI prophylaxis: H2B Glucose control:  Insulin  gtt Central venous access:  Yes, and it is still needed Arterial line:  N/A Foley:  Yes, and it is still needed Mobility:  bed rest  PT consulted: N/A Last date of multidisciplinary goals of care discussion [no family at bedside] Code Status:  full code Disposition: ICU   = Goals of Care = Code Status Order: FULL  Primary Emergency Contact: Dennis,Latonya Wishes to pursue full aggressive treatment and intervention options, including CPR and intubation,  but goals of care will be addressed on going with family if that should become necessary.   Critical care time: 45 minutes        [name] DNP, CCRN, FNP-C, AGACNP-BC Acute Care & Family Nurse Practitioner [facility] PCCM on call pager [phone]

## 2023-12-11 NOTE — Procedures (Signed)
 CENTRAL VENOUS CATHETER INSERTION PROCEDURE NOTE  Pamela Lane  [id number]  Apr 14, 1961  Date:12/11/23  Time:8:42 AM   Provider Performing:Pamela Lane   Procedure: Insertion of Non-tunneled Central Venous [id number]) with US  guidance (56213)   Indication(s) Medication administration and Difficult access  Consent Risks of the procedure as well as the alternatives and risks of each were explained to the patient and/or caregiver.  Consent for the procedure was obtained and is signed in the bedside chart  Anesthesia Topical only with 1% lidocaine    Timeout Verified patient identification, verified procedure, site/side was marked, verified correct patient position, special equipment/implants available, medications/allergies/relevant history reviewed, required imaging and test results available.  Sterile Technique Maximal sterile technique including full sterile barrier drape, hand hygiene, sterile gown, sterile gloves, mask, hair covering, sterile ultrasound probe cover (if used).  Procedure Description Area of catheter insertion was cleaned with chlorhexidine  and draped in sterile fashion.  With real-time ultrasound guidance a central venous catheter was placed into the left internal jugular vein. Nonpulsatile blood flow and easy flushing noted in all ports.  The catheter was sutured in place and sterile dressing applied.  Complications/Tolerance None; patient tolerated the procedure well. Chest X-ray is ordered to verify placement for internal jugular or subclavian cannulation.   Chest x-ray is not ordered for femoral cannulation.  EBL Minimal  Specimen(s) None   Pamela Arthurs, DNP, CCRN, FNP-C, AGACNP-BC Acute Care & Family Nurse Practitioner  [facility]  See Amion for personal pager PCCM on call pager [phone] until 7 am

## 2023-12-11 NOTE — Plan of Care (Signed)
  Problem: Education: Goal: Ability to describe self-care measures that may prevent or decrease complications (Diabetes Survival Skills Education) will improve Outcome: Not Progressing Goal: Individualized Educational Video(s) Outcome: Not Progressing   Problem: Coping: Goal: Ability to adjust to condition or change in health will improve Outcome: Not Progressing   Problem: Fluid Volume: Goal: Ability to maintain a balanced intake and output will improve Outcome: Not Progressing   Problem: Health Behavior/Discharge Planning: Goal: Ability to identify and utilize available resources and services will improve Outcome: Not Progressing Goal: Ability to manage health-related needs will improve Outcome: Not Progressing   Problem: Metabolic: Goal: Ability to maintain appropriate glucose levels will improve Outcome: Not Progressing   Problem: Nutritional: Goal: Maintenance of adequate nutrition will improve Outcome: Not Progressing Goal: Progress toward achieving an optimal weight will improve Outcome: Not Progressing   Problem: Skin Integrity: Goal: Risk for impaired skin integrity will decrease Outcome: Not Progressing   Problem: Tissue Perfusion: Goal: Adequacy of tissue perfusion will improve Outcome: Not Progressing   Problem: Education: Goal: Ability to describe self-care measures that may prevent or decrease complications (Diabetes Survival Skills Education) will improve Outcome: Not Progressing Goal: Individualized Educational Video(s) Outcome: Not Progressing   Problem: Cardiac: Goal: Ability to maintain an adequate cardiac output will improve Outcome: Not Progressing   Problem: Health Behavior/Discharge Planning: Goal: Ability to identify and utilize available resources and services will improve Outcome: Not Progressing Goal: Ability to manage health-related needs will improve Outcome: Not Progressing   Problem: Fluid Volume: Goal: Ability to achieve a  balanced intake and output will improve Outcome: Not Progressing   Problem: Metabolic: Goal: Ability to maintain appropriate glucose levels will improve Outcome: Not Progressing   Problem: Nutritional: Goal: Maintenance of adequate nutrition will improve Outcome: Not Progressing Goal: Maintenance of adequate weight for body size and type will improve Outcome: Not Progressing   Problem: Respiratory: Goal: Will regain and/or maintain adequate ventilation Outcome: Not Progressing   Problem: Urinary Elimination: Goal: Ability to achieve and maintain adequate renal perfusion and functioning will improve Outcome: Not Progressing   Problem: Education: Goal: Knowledge of General Education information will improve Description: Including pain rating scale, medication(s)/side effects and non-pharmacologic comfort measures Outcome: Not Progressing   Problem: Health Behavior/Discharge Planning: Goal: Ability to manage health-related needs will improve Outcome: Not Progressing   Problem: Clinical Measurements: Goal: Ability to maintain clinical measurements within normal limits will improve Outcome: Not Progressing Goal: Will remain free from infection Outcome: Not Progressing Goal: Diagnostic test results will improve Outcome: Not Progressing Goal: Respiratory complications will improve Outcome: Not Progressing Goal: Cardiovascular complication will be avoided Outcome: Not Progressing   Problem: Activity: Goal: Risk for activity intolerance will decrease Outcome: Not Progressing   Problem: Nutrition: Goal: Adequate nutrition will be maintained Outcome: Not Progressing   Problem: Coping: Goal: Level of anxiety will decrease Outcome: Not Progressing   Problem: Elimination: Goal: Will not experience complications related to bowel motility Outcome: Not Progressing Goal: Will not experience complications related to urinary retention Outcome: Not Progressing   Problem: Pain  Managment: Goal: General experience of comfort will improve and/or be controlled Outcome: Not Progressing   Problem: Safety: Goal: Ability to remain free from injury will improve Outcome: Not Progressing   Problem: Skin Integrity: Goal: Risk for impaired skin integrity will decrease Outcome: Not Progressing

## 2023-12-12 ENCOUNTER — Other Ambulatory Visit: Payer: Self-pay

## 2023-12-12 ENCOUNTER — Inpatient Hospital Stay: Payer: MEDICAID

## 2023-12-12 DIAGNOSIS — G929 Unspecified toxic encephalopathy: Secondary | ICD-10-CM

## 2023-12-12 DIAGNOSIS — E1110 Type 2 diabetes mellitus with ketoacidosis without coma: Secondary | ICD-10-CM

## 2023-12-12 DIAGNOSIS — I959 Hypotension, unspecified: Secondary | ICD-10-CM

## 2023-12-12 DIAGNOSIS — J9600 Acute respiratory failure, unspecified whether with hypoxia or hypercapnia: Secondary | ICD-10-CM

## 2023-12-12 LAB — CBC
HCT: 33.4 % — ABNORMAL LOW (ref 36.0–46.0)
Hemoglobin: 11.8 g/dL — ABNORMAL LOW (ref 12.0–15.0)
MCH: 30.3 pg (ref 26.0–34.0)
MCHC: 35.3 g/dL (ref 30.0–36.0)
MCV: 85.6 fL (ref 80.0–100.0)
Platelets: 187 10*3/uL (ref 150–400)
RBC: 3.9 MIL/uL (ref 3.87–5.11)
RDW: 12.5 % (ref 11.5–15.5)
WBC: 10 10*3/uL (ref 4.0–10.5)
nRBC: 0 % (ref 0.0–0.2)

## 2023-12-12 LAB — BLOOD GAS, VENOUS
Acid-Base Excess: 5.4 mmol/L — ABNORMAL HIGH (ref 0.0–2.0)
Bicarbonate: 29.9 mmol/L — ABNORMAL HIGH (ref 20.0–28.0)
O2 Saturation: 74.8 %
Patient temperature: 37
pCO2, Ven: 42 mmHg — ABNORMAL LOW (ref 44–60)
pH, Ven: 7.46 — ABNORMAL HIGH (ref 7.25–7.43)
pO2, Ven: 41 mmHg (ref 32–45)

## 2023-12-12 LAB — BASIC METABOLIC PANEL WITH GFR
Anion gap: 9 (ref 5–15)
Anion gap: 6 (ref 5–15)
BUN: 23 mg/dL (ref 8–23)
BUN: 21 mg/dL (ref 8–23)
CO2: 28 mmol/L (ref 22–32)
CO2: 30 mmol/L (ref 22–32)
Calcium: 8.2 mg/dL — ABNORMAL LOW (ref 8.9–10.3)
Calcium: 8.2 mg/dL — ABNORMAL LOW (ref 8.9–10.3)
Chloride: 104 mmol/L (ref 98–111)
Chloride: 104 mmol/L (ref 98–111)
Creatinine, Ser: 1.08 mg/dL — ABNORMAL HIGH (ref 0.44–1.00)
Creatinine, Ser: 1.03 mg/dL — ABNORMAL HIGH (ref 0.44–1.00)
GFR, Estimated: 58 mL/min — ABNORMAL LOW (ref >60)
GFR, Estimated: >60 mL/min (ref >60)
Glucose, Bld: 107 mg/dL — ABNORMAL HIGH (ref 70–99)
Glucose, Bld: 155 mg/dL — ABNORMAL HIGH (ref 70–99)
Potassium: 3.5 mmol/L (ref 3.5–5.1)
Potassium: 2.8 mmol/L — ABNORMAL LOW (ref 3.5–5.1)
Sodium: 141 mmol/L (ref 135–145)
Sodium: 140 mmol/L (ref 135–145)

## 2023-12-12 LAB — LACTIC ACID, PLASMA: Lactic Acid, Venous: 1.3 mmol/L (ref 0.5–1.9)

## 2023-12-12 LAB — PHOSPHORUS
Phosphorus: 1.6 mg/dL — ABNORMAL LOW (ref 2.5–4.6)
Phosphorus: 2.6 mg/dL (ref 2.5–4.6)

## 2023-12-12 LAB — URINE CULTURE: Culture: NO GROWTH

## 2023-12-12 LAB — GLUCOSE, CAPILLARY
Glucose-Capillary: 104 mg/dL — ABNORMAL HIGH (ref 70–99)
Glucose-Capillary: 131 mg/dL — ABNORMAL HIGH (ref 70–99)
Glucose-Capillary: 131 mg/dL — ABNORMAL HIGH (ref 70–99)
Glucose-Capillary: 131 mg/dL — ABNORMAL HIGH (ref 70–99)
Glucose-Capillary: 161 mg/dL — ABNORMAL HIGH (ref 70–99)
Glucose-Capillary: 163 mg/dL — ABNORMAL HIGH (ref 70–99)
Glucose-Capillary: 165 mg/dL — ABNORMAL HIGH (ref 70–99)
Glucose-Capillary: 169 mg/dL — ABNORMAL HIGH (ref 70–99)
Glucose-Capillary: 171 mg/dL — ABNORMAL HIGH (ref 70–99)
Glucose-Capillary: 177 mg/dL — ABNORMAL HIGH (ref 70–99)
Glucose-Capillary: 183 mg/dL — ABNORMAL HIGH (ref 70–99)
Glucose-Capillary: 198 mg/dL — ABNORMAL HIGH (ref 70–99)
Glucose-Capillary: 221 mg/dL — ABNORMAL HIGH (ref 70–99)
Glucose-Capillary: 98 mg/dL (ref 70–99)

## 2023-12-12 LAB — MAGNESIUM: Magnesium: 1.8 mg/dL (ref 1.7–2.4)

## 2023-12-12 LAB — POTASSIUM: Potassium: 2.9 mmol/L — ABNORMAL LOW (ref 3.5–5.1)

## 2023-12-12 LAB — TROPONIN I (HIGH SENSITIVITY): Troponin I (High Sensitivity): 61 ng/L — ABNORMAL HIGH (ref <18)

## 2023-12-12 LAB — CALCIUM, IONIZED: Calcium, Ionized, Serum: 4.7 mg/dL (ref 4.5–5.6)

## 2023-12-12 MED ORDER — HYDRALAZINE HCL 20 MG/ML IJ SOLN
INTRAMUSCULAR | Status: AC
  Administered 2023-12-12: 10 mg via INTRAVENOUS
  Filled 2023-12-12: qty 1

## 2023-12-12 MED ORDER — PHENOL 1.4 % MT LIQD
1.0000 | OROMUCOSAL | Status: DC | PRN
  Administered 2023-12-12 – 2023-12-14 (×3): 1 via OROMUCOSAL
  Filled 2023-12-12: qty 177

## 2023-12-12 MED ORDER — INSULIN GLARGINE-YFGN 100 UNIT/ML ~~LOC~~ SOLN
40.0000 [IU] | Freq: Every day | SUBCUTANEOUS | Status: DC
  Administered 2023-12-12 – 2023-12-13 (×2): 40 [IU] via SUBCUTANEOUS
  Filled 2023-12-12 (×2): qty 0.4

## 2023-12-12 MED ORDER — IPRATROPIUM-ALBUTEROL 0.5-2.5 (3) MG/3ML IN SOLN
3.0000 mL | Freq: Two times a day (BID) | RESPIRATORY_TRACT | Status: DC
  Administered 2023-12-13 – 2023-12-15 (×4): 3 mL via RESPIRATORY_TRACT
  Filled 2023-12-12 (×5): qty 3

## 2023-12-12 MED ORDER — METHYLPREDNISOLONE SODIUM SUCC 40 MG IJ SOLR
40.0000 mg | Freq: Two times a day (BID) | INTRAMUSCULAR | Status: AC
  Administered 2023-12-12 – 2023-12-13 (×3): 40 mg via INTRAVENOUS
  Filled 2023-12-12 (×3): qty 1

## 2023-12-12 MED ORDER — POTASSIUM PHOSPHATES 15 MMOLE/5ML IV SOLN
20.0000 mmol | Freq: Once | INTRAVENOUS | Status: AC
  Administered 2023-12-12: 20 mmol via INTRAVENOUS
  Filled 2023-12-12: qty 6.67

## 2023-12-12 MED ORDER — HYDRALAZINE HCL 20 MG/ML IJ SOLN: 10.0000 mg | INTRAMUSCULAR | Status: DC | PRN

## 2023-12-12 MED ORDER — INSULIN ASPART 100 UNIT/ML IJ SOLN
0.0000 [IU] | INTRAMUSCULAR | Status: DC
  Administered 2023-12-12: 2 [IU] via SUBCUTANEOUS
  Administered 2023-12-12: 3 [IU] via SUBCUTANEOUS
  Administered 2023-12-12: 5 [IU] via SUBCUTANEOUS
  Administered 2023-12-12: 2 [IU] via SUBCUTANEOUS
  Administered 2023-12-13 (×2): 3 [IU] via SUBCUTANEOUS
  Filled 2023-12-12 (×6): qty 1

## 2023-12-12 MED ORDER — DEXMEDETOMIDINE HCL IN NACL 400 MCG/100ML IV SOLN
0.0000 ug/kg/h | INTRAVENOUS | Status: DC
  Administered 2023-12-12: 0.4 ug/kg/h via INTRAVENOUS
  Filled 2023-12-12: qty 100

## 2023-12-12 MED ORDER — ORAL CARE MOUTH RINSE: 15.0000 mL | OROMUCOSAL | Status: DC | PRN

## 2023-12-12 MED ORDER — MAGNESIUM SULFATE 2 GM/50ML IV SOLN
2.0000 g | Freq: Once | INTRAVENOUS | Status: AC
  Administered 2023-12-12: 2 g via INTRAVENOUS
  Filled 2023-12-12: qty 50

## 2023-12-12 MED ORDER — POTASSIUM CHLORIDE 10 MEQ/50ML IV SOLN
10.0000 meq | INTRAVENOUS | Status: AC
  Administered 2023-12-12 (×4): 10 meq via INTRAVENOUS
  Filled 2023-12-12 (×4): qty 50

## 2023-12-12 MED ORDER — IPRATROPIUM-ALBUTEROL 0.5-2.5 (3) MG/3ML IN SOLN
3.0000 mL | Freq: Four times a day (QID) | RESPIRATORY_TRACT | Status: DC | PRN
  Administered 2023-12-13: 3 mL via RESPIRATORY_TRACT

## 2023-12-12 MED ORDER — SODIUM CHLORIDE 0.9 % IV SOLN: INTRAVENOUS | Status: AC | PRN

## 2023-12-12 MED ORDER — IPRATROPIUM-ALBUTEROL 0.5-2.5 (3) MG/3ML IN SOLN
3.0000 mL | Freq: Four times a day (QID) | RESPIRATORY_TRACT | Status: DC
  Administered 2023-12-12: 3 mL via RESPIRATORY_TRACT
  Filled 2023-12-12: qty 3

## 2023-12-12 MED ORDER — NOREPINEPHRINE 4 MG/250ML-% IV SOLN
0.0000 ug/min | INTRAVENOUS | Status: DC
  Administered 2023-12-12: 4 ug/min via INTRAVENOUS
  Filled 2023-12-12: qty 250

## 2023-12-12 NOTE — Procedures (Signed)
 Extubation Procedure Note  Patient Details:   Name: Pamela Lane DOB: 1961-06-26 MRN: [id number]   Airway Documentation:    Vent end date: 12/12/23 Vent end time: 1540   Evaluation  O2 sats: stable throughout Complications: No apparent complications Patient did tolerate procedure well. Bilateral Breath Sounds: Clear   Yes able to cough and speak.  Arna Better Edye Hainline 12/12/2023, 3:45 PM

## 2023-12-12 NOTE — Consult Note (Signed)
 PHARMACY CONSULT NOTE - ELECTROLYTES  Pharmacy Consult for Electrolyte Monitoring and Replacement   Recent Labs: Height: 5\' 2"  (157.5 cm) Weight: 76.9 kg (169 lb 8.5 oz) IBW/kg (Calculated) : 50.1 Estimated Creatinine Clearance: 51.8 mL/min (A) (by C-G formula based on SCr of 1.08 mg/dL (H)). Potassium (mmol/L)  Date Value  12/12/2023 3.5   Magnesium  (mg/dL)  Date Value  16/04/9603 1.8   Calcium  (mg/dL)  Date Value  54/03/8118 8.2 (L)   Albumin (g/dL)  Date Value  14/78/2956 4.0   Phosphorus (mg/dL)  Date Value  21/30/8657 1.6 (L)   Sodium (mmol/L)  Date Value  12/12/2023 141    Assessment  Pamela Lane is a 63 y.o. female presenting with shortness of breath. PMH significant for DKA, HTN, HLD, asthma, anxiety, depression, and polysubstance abuse. Pharmacy has been consulted to monitor and replace electrolytes.  Diet: NPO MIVF: Fluids per hyperglycemic crisis protocol Pertinent medications: Insulin  gtt  Goal of Therapy: Electrolytes WNL  Plan:  K 3.5, Phos 1.6; potassium phosphate  20 mmol IV x 1 Mg 1.8, magnesium  sulfate 2 g IV x 1 Continue to follow along - anticipate transition to [place] insulin  regimen shortly  Thank you for allowing pharmacy to be a part of this patient's care.  Page Boast 12/12/2023 8:28 AM

## 2023-12-12 NOTE — Progress Notes (Signed)
 Attempted to contact pts daughter Vicky Grange via telephone to inform her Ms. Marchio has been successfully extubated.  However, she did not answer the telephone left HIPAA appropriate voicemail instructing her to return my phone call.  Janey Meek, AGNP  Pulmonary/Critical Care Pager [phone] (please enter 7 digits) PCCM Consult Pager [phone] (please enter 7 digits)

## 2023-12-12 NOTE — Progress Notes (Signed)
 Patient alert and able to follow commands.  Dana NP and RT present, patient extubated without complications to room air.  Patient able to cough and answer questions appropriately.  Will continue to monitor

## 2023-12-12 NOTE — Inpatient Diabetes Management (Signed)
 Inpatient Diabetes Program Recommendations  AACE/ADA: New Consensus Statement on Inpatient Glycemic Control   Target Ranges:  Prepandial:   less than 140 mg/dL      Peak postprandial:   less than 180 mg/dL (1-2 hours)      Critically ill patients:  140 - 180 mg/dL    Latest Reference Range & Units 12/12/23 00:05 12/12/23 02:00 12/12/23 04:07 12/12/23 04:43 12/12/23 05:31 12/12/23 06:36 12/12/23 07:25  Glucose-Capillary 70 - 99 mg/dL 284 (H) 132 (H) 98 440 (H) 165 (H) 183 (H) 198 (H)    Latest Reference Range & Units 12/11/23 03:49  CO2 22 - 32 mmol/L <7 (L)  Glucose 70 - 99 mg/dL 102 (HH)  BUN 8 - 23 mg/dL 27 (H)  Creatinine 7.25 - 1.00 mg/dL 3.66 (H)  Calcium  8.9 - 10.3 mg/dL 9.5  Anion gap 5 - 15  NOT CALCULATED    Latest Reference Range & Units 12/11/23 07:15 12/11/23 09:50 12/11/23 13:21 12/11/23 17:34 12/11/23 22:02  Beta-Hydroxybutyric Acid 0.05 - 0.27 mmol/L >8.00 (H) 5.91 (H) 3.69 (H) 1.74 (H) 0.19   Review of Glycemic Control  Diabetes history: DM2 Outpatient Diabetes medications: Lantus  55 units daily, Novolog  15 units TID with meals, Januvia  100 mg daily Current orders for Inpatient glycemic control: IV insulin    Inpatient Diabetes Program Recommendations:     Insulin : At time of transition from IV to SQ insulin , please consider increasing Semglee  to 40 units daily, CBGs Q4H, Novolog  0-15 units Q4H, and when patient starts eating will need meal coverage.  NOTE: Patient is well known to inpatient diabetes team. Patient was most recently inpatient 10/12/23-10/14/23. Patient admitted on 12/11/23 with acute respiratory failure, DKA, and AKI; patient has hx of polysubstance abuse (cocaine, alcohol, and marijuana) which likely impacts patient's ability to self manage DM. Patient's initial glucose 683 mg/dl and was started on IV insulin .   Thanks, Beacher Limerick, RN, MSN, CDCES Diabetes Coordinator Inpatient Diabetes Program [phone] (Team Pager from 8am to 5pm)

## 2023-12-12 NOTE — Progress Notes (Signed)
 NAME:  Pamela Lane, MRN:  [id number], DOB:  05-24-1961, LOS: 1 ADMISSION DATE:  12/11/2023, CONSULTATION DATE: 12/11/2023  History of Present Illness:  63 y.o female with significant PMH of uncontrolled diabetes mellitus, recurrent DKA, hypertension, hyperlipidemia, asthma, anxiety and depression, bipolar disorder, CKD stage III, SVT, C. difficile colitis and polysubstance abuse who presented to the ED with chief complaints of breath and hypoxia with a glucose reading of high per EMS    ED Course: Initial vital signs showed HR of 180 beats/minute, BP133/83 mm Hg, the RR 30 breaths/minute, and the oxygen saturation 98% on vent and a temperature of 98.41F (37C).  Pertinent Labs/Diagnostics Findings: Na+/ K+: 132/7.0 Glucose: 683 BUN/Cr.:27/2.01  WBC: 12.8 K/L  UDS +cocaine UA+ketones Beta Hydroxy elevated COVID PCR: Negative,  VBG: pO2 46; pCO2 27; pH 6.95;  HCO3 5.4, %O2 Sat 68.8.  CXR> see results Medication administered in the VH:QIONGEX went into svt sustaining rates upto 190"s with unstable BP, received Adenosine  6mg , Defibrillated x 10 and received 2 amps of sodium Bicarb, calcum gluconate, IV insulin  10 units x 2 and IV Metoprolol  with rate improvement to NSR in the 90'-100s. Disposition:ICU  Pertinent  Medical History  Uncontrolled diabetes mellitus, recurrent DKA, hypertension, hyperlipidemia, asthma, anxiety and depression, bipolar disorder, CKD stage III, SVT, C. difficile colitis and polysubstance abuse   Micro Data:  MRSA PCR 06/01>>negative  Resp panel by RT-PCR 06/01>>negative  Urine 06/01>> Blood x2 06/01>> Strep pneum ur antigen 06/01>>negative   Significant Hospital Events: Including procedures, antibiotic start and stop dates in addition to other pertinent events   06/01: Admit to ICU with Acute respiratory failure requiring intubation and found to have DKA in the setting of medication non-compliance and cocaine use.  06/02: Pt remains mechanically intubated  on minimal support.  During WUA pt agitated requiring resedation with propofol /fentanyl  gtts.  Will attempt to wean off propofol  gtt and start precedex  gtt for delirium during WUA   Interim History / Subjective:  As outlined above   Objective    Blood pressure (!) 147/66, pulse (!) 110, temperature 98.4 F (36.9 C), temperature source Axillary, resp. rate 20, height 5\' 2"  (1.575 m), weight 76.9 kg, SpO2 100%. CVP:  [3 mmHg-11 mmHg] 6 mmHg  Vent Mode: PSV;CPAP FiO2 (%):  [30 %-40 %] 30 % Set Rate:  [20 bmp-25 bmp] 20 bmp Vt Set:  [430 mL-450 mL] 430 mL PEEP:  [5 cmH20] 5 cmH20 Pressure Support:  [5 cmH20] 5 cmH20 Plateau Pressure:  [14 cmH20-15 cmH20] 15 cmH20   Intake/Output Summary (Last 24 hours) at 12/12/2023 0809 Last data filed at 12/12/2023 0700 Gross per 24 hour  Intake 8276.41 ml  Output 1545 ml  Net 6731.41 ml   Filed Weights   12/11/23 0354 12/11/23 0607 12/12/23 0400  Weight: 78.3 kg 78 kg 76.9 kg    Examination: General: Acutely-ill appearing female, NAD mechanically intubated  HENT: Supple, no JVD  Lungs: Clear throughout, even, non labored Cardiovascular: NSR, s1s2, no m/r/g, 2+ radial/2+ distal pulses, no edema  Abdomen: +BS x4, soft, obese, non tender  Extremities: Normal bulk and tone Neuro: Sedated, not following commands, PERRL GU: Indwelling foley catheter draining yellow urine   Resolved problem list  Metabolic acidosis   Assessment and Plan   #Acute toxic metabolic encephalopathy  #Cocaine abuse  #Mechanical intubation pain/discomfort  - Correct metabolic derangements  - Maintain RASS goal 0 to -1 - PAD protocol to maintain RASS goal: Will attempt to wean off propofol  gtt  and start precedex  gtt to assist with delirium and continue fentanyl  gtt  - WUA daily  - Polysubstance abuse cessation counseling once extubated and mentation back to baseline   #Hypotension likely sedation related  #Elevated troponin likely secondary to demand ischemia  Hx:  Essential HTN  - Continuous telemetry monitoring  - Will recheck troponin  - Hold outpatient cardizem  for now  - Prn levophed  gtt to maintain map >65  #Acute respiratory failure secondary to severe metabolic acidosis  #Possible aspiration  #Mechanical intubation  Hx: Asthma  - Full vent support for now: vent settings reviewed and established  - Continue lung protective strategies - Maintain plateau pressures less than 30 cm H2O - VAP bundle implemented  - Intermittent CXR and ABG's  - SBT once all parameters met - Prn bronchodilator therapy   #Mild acute kidney injury likely secondary ATN  #Hypophosphatemia  #Hypomagnesia  #Lactic acidosis  - Trend BMP  - Recheck lactic acid  - Replace electrolytes as indicated  - Strict I&O's - Avoid nephrotoxic agents as able   #Leukocytosis~resolved without obvious signs of infection  - Trend WBC and monitor fever curve  - Follow cultures  - With no signs of infection will discontinue abx, however if pt becomes febrile and develops leukocytosis will restart abx   #Diabetic ketoacidosis  #Type II diabetes mellitus  - Will transition off insulin  gtt and start SSI/scheduled semglee  40 units daily - Follow hyper/hypoglycemic protocol  - Target CBG's 140 to 180 - Diabetes coordinator consulted appreciate input   Best Practice (right click and "Reselect all SmartList Selections" daily)   Diet/type: NPO; if unable to extubate today will start TF's  DVT prophylaxis prophylactic heparin   Pressure ulcer(s): N/A GI prophylaxis: PPI Lines: Central line Foley:  Yes, and it is still needed Code Status:  full code Last date of multidisciplinary goals of care discussion [12/12/2023]  06/02: Attempted to contact pts daughter Vicky Grange via telephone, however the number was out of service  Labs   CBC: Recent Labs  Lab 12/11/23 0349 12/11/23 0715 12/12/23 0439  WBC 12.8* 25.0* 10.0  NEUTROABS 9.8*  --   --   HGB 16.7* 14.7 11.8*  HCT  52.9* 44.0 33.4*  MCV 95.8 92.4 85.6  PLT 348 347 187    Basic Metabolic Panel: Recent Labs  Lab 12/11/23 0950 12/11/23 1321 12/11/23 1734 12/11/23 2202 12/12/23 0439  NA 138 139 137 139 141  K 4.2 3.6 3.7 3.1* 3.5  CL 107 106 104 106 104  CO2 15* 19* 24 26 28   GLUCOSE 366* 225* 266* 152* 107*  BUN 30* 31* 28* 27* 23  CREATININE 2.07* 1.59* 1.27* 1.15* 1.08*  CALCIUM  8.6* 8.5* 8.2* 8.3* 8.2*  MG  --   --   --   --  1.8  PHOS  --   --   --   --  1.6*   GFR: Estimated Creatinine Clearance: 51.8 mL/min (A) (by C-G formula based on SCr of 1.08 mg/dL (H)). Recent Labs  Lab 12/11/23 0349 12/11/23 0715 12/11/23 0950 12/12/23 0439  WBC 12.8* 25.0*  --  10.0  LATICACIDVEN  --  3.9* 2.1*  --     Liver Function Tests: Recent Labs  Lab 12/11/23 0349 12/11/23 0715  AST 15 20  ALT 15 17  ALKPHOS 130* 119  BILITOT 1.4* 1.8*  PROT 9.0* 7.8  ALBUMIN 4.7 4.0   Recent Labs  Lab 12/11/23 0715  LIPASE 23   No results for  input(s): "AMMONIA" in the last 168 hours.  ABG    Component Value Date/Time   PHART 7.34 (L) 10/08/2020 0805   PCO2ART 40 10/08/2020 0805   PO2ART 78 (L) 10/08/2020 0805   HCO3 29.9 (H) 12/12/2023 0050   TCO2 15 01/12/2016 2315   ACIDBASEDEF 3.0 (H) 12/11/2023 1526   O2SAT 74.8 12/12/2023 0050     Coagulation Profile: Recent Labs  Lab 12/11/23 0715  INR 1.2    Cardiac Enzymes: No results for input(s): "CKTOTAL", "CKMB", "CKMBINDEX", "TROPONINI" in the last 168 hours.  HbA1C: Hgb A1c MFr Bld  Date/Time Value Ref Range Status  10/12/2023 07:07 AM 14.3 (H) 4.8 - 5.6 % Final    Comment:    (NOTE) Pre diabetes:          5.7%-6.4%  Diabetes:              >6.4%  Glycemic control for   <7.0% adults with diabetes   04/11/2023 03:28 AM >15.5 (H) 4.8 - 5.6 % Final    Comment:    (NOTE)         Prediabetes: 5.7 - 6.4         Diabetes: >6.4         Glycemic control for adults with diabetes: <7.0     CBG: Recent Labs  Lab  12/12/23 0407 12/12/23 0443 12/12/23 0531 12/12/23 0636 12/12/23 0725  GLUCAP 98 104* 165* 183* 198*    Review of Systems:   Unable to assess pt mechanically intubated  Past Medical History:  She,  has a past medical history of Asthma, Clostridium difficile colitis, Diabetes mellitus without complication (HCC), Hypertension, and Mental disorder.   Surgical History:   Past Surgical History:  Procedure Laterality Date   ABDOMINAL HYSTERECTOMY       Social History:   reports that she has been smoking cigarettes. She has never used smokeless tobacco. She reports that she does not currently use alcohol. She reports current drug use. Drugs: Marijuana and Cocaine.   Family History:  Her family history includes Hypertension in her mother.   Allergies No Known Allergies   Home Medications  Prior to Admission medications   Medication Sig Start Date End Date Taking? Authorizing Provider  atorvastatin  (LIPITOR) 40 MG tablet Take 1 tablet (40 mg total) by mouth at bedtime. 09/16/23 12/11/23 Yes Aisha Hove, MD  buPROPion  (WELLBUTRIN  SR) 150 MG 12 hr tablet Take 1 tablet (150 mg total) by mouth 2 (two) times daily. 09/16/23 12/11/23 Yes Aisha Hove, MD  diltiazem  (CARDIZEM  CD) 120 MG 24 hr capsule Take 120 mg by mouth daily. 09/16/23  Yes [provider]  gabapentin  (NEURONTIN ) 300 MG capsule Take 1 capsule (300 mg total) by mouth 3 (three) times daily. 10/14/23 12/11/23 Yes Dezii, Alexandra, DO  insulin  glargine (LANTUS ) 100 UNIT/ML Solostar Pen Inject 55 Units into the skin daily. 10/14/23 12/11/23 Yes Dezii, Alexandra, DO  Accu-Chek Softclix Lancets lancets Use 3 (three) times daily to check blood sugar 09/16/23   Aisha Hove, MD  Blood Glucose Monitoring Suppl (BLOOD GLUCOSE MONITOR SYSTEM) w/Device KIT Use 3 (three) times daily to check blood sugar. 09/16/23   Aisha Hove, MD  Dextromethorphan -guaiFENesin  20-200 MG/20ML LIQD Take 10 mLs by mouth every 4  (four) hours as needed. 09/16/23   Aisha Hove, MD  Glucose Blood (BLOOD GLUCOSE TEST STRIPS) STRP Use 3 (three) times daily to check blood sugar. 09/16/23   Aisha Hove, MD  insulin  aspart (NOVOLOG ) 100 UNIT/ML FlexPen Inject 10  Units into the skin 3 (three) times daily with meals. Patient not taking: Reported on 10/31/2023 09/16/23   Aisha Hove, MD  Insulin  Pen Needle 31G X 8 MM MISC Use 5 times daily with Lantus  and Novolog  09/16/23   Aisha Hove, MD  Multiple Vitamin (MULTIVITAMIN WITH MINERALS) TABS tablet Take 1 tablet by mouth daily. 09/16/23   Aisha Hove, MD  omeprazole  (PRILOSEC ) 20 MG capsule Take 1 capsule (20 mg total) by mouth daily. 09/16/23 10/16/23  Aisha Hove, MD  sitaGLIPtin  (JANUVIA ) 100 MG tablet Take 1 tablet (100 mg total) by mouth daily. 09/16/23 10/16/23  Aisha Hove, MD     Critical care time: 40 minutes      Janey Meek, AGNP  Pulmonary/Critical Care Pager [phone] (please enter 7 digits) PCCM Consult Pager [phone] (please enter 7 digits)

## 2023-12-12 NOTE — Progress Notes (Signed)
 Initial Nutrition Assessment  DOCUMENTATION CODES:   Obesity unspecified  INTERVENTION:   Vital 1.2@50ml /hr- Initiate at 23ml/hr and increase by 10ml/hr q 8 hours until goal rate is reached.   ProSource TF 20- Give 60ml daily via tube, each supplement provides 80kcal and 20g of protein.   Free water  flushes 30ml q4 hours to maintain tube patency   Regimen provides 1529kcal/day, 110g/day protein and 1146ml/day of free water .   Pt at high refeed risk; recommend monitor potassium, magnesium  and phosphorus labs daily until stable  Daily weights   NUTRITION DIAGNOSIS:   Inadequate oral intake related to inability to eat (pt sedated and ventilated) as evidenced by NPO status.  GOAL:   Provide needs based on ASPEN/SCCM guidelines  MONITOR:   Vent status, Labs, Weight trends, Skin, I & O's  REASON FOR ASSESSMENT:   Ventilator    ASSESSMENT:   63 y/o female with h/o drug and etoh abuse, bipolar disorder, HTN, HLD, GERD, SVT, asthma, CKD III, anxiety, depression and C diff who is admitted with DKA, aspiration and AKI.  Pt sedated and ventilated. OGT in place to LIS with output. Plan is for possible extubation today. Will plan to initiate tube feeds if pt does not extubate. Pt is likely at high refeed risk. Per chart, pt is down 8lbs(5%) over the past 5 weeks; this is not significant weight loss. Pt is down ~3lbs since admission. Pt +7.0L on her I & Os.   Medications reviewed and include: heparin , insulin , protonix , levophed , Kphos  Labs reviewed: K 2.8(L), creat 1.03(H), P 1.6(L), Mg 1.8 wnl Cbgs- 171, 131, 169, 198, 183, 165 x 24 hrs  AIC 14.3(H)- 4/2  Patient is currently intubated on ventilator support MV: 7.4 L/min Temp (24hrs), Avg:98.3 F (36.8 C), Min:97.1 F (36.2 C), Max:99.4 F (37.4 C)  MAP >82mmHg   UOP-   NUTRITION - FOCUSED PHYSICAL EXAM:  Flowsheet Row Most Recent Value  Orbital Region No depletion  Upper Arm Region No depletion   Thoracic and Lumbar Region No depletion  Buccal Region No depletion  Temple Region Moderate depletion  Clavicle Bone Region Moderate depletion  Clavicle and Acromion Bone Region Moderate depletion  Scapular Bone Region No depletion  Dorsal Hand No depletion  Patellar Region Moderate depletion  Anterior Thigh Region Moderate depletion  Posterior Calf Region Moderate depletion  Edema (RD Assessment) None  Hair Reviewed  Eyes Reviewed  Mouth Reviewed  Skin Reviewed  Nails Reviewed   Diet Order:   Diet Order             Diet NPO time specified  Diet effective now                  EDUCATION NEEDS:   No education needs have been identified at this time  Skin:  Skin Assessment: Reviewed RN Assessment  Last BM:  pta  Height:   Ht Readings from Last 1 Encounters:  12/11/23 5\' 2"  (1.575 m)    Weight:   Wt Readings from Last 1 Encounters:  12/12/23 76.9 kg    Ideal Body Weight:  50 kg  BMI:  Body mass index is 31.01 kg/m.  Estimated Nutritional Needs:   Kcal:  1498kcal/day  Protein:  >100g/day  Fluid:  1.6-1.8L/day  Torrance Freestone MS, RD, LDN If unable to be reached, please send secure chat to "RD inpatient" available from 8:00a-4:00p daily

## 2023-12-12 NOTE — Plan of Care (Signed)
  Problem: Fluid Volume: Goal: Ability to maintain a balanced intake and output will improve Outcome: Progressing   Problem: Metabolic: Goal: Ability to maintain appropriate glucose levels will improve Outcome: Progressing   Problem: Skin Integrity: Goal: Risk for impaired skin integrity will decrease Outcome: Progressing   Problem: Metabolic: Goal: Ability to maintain appropriate glucose levels will improve Outcome: Progressing   Problem: Clinical Measurements: Goal: Will remain free from infection Outcome: Progressing Goal: Diagnostic test results will improve Outcome: Progressing Goal: Respiratory complications will improve Outcome: Progressing Goal: Cardiovascular complication will be avoided Outcome: Progressing   Problem: Elimination: Goal: Will not experience complications related to urinary retention Outcome: Progressing   Problem: Respiratory: Goal: Ability to maintain a clear airway and adequate ventilation will improve Outcome: Progressing

## 2023-12-12 NOTE — Plan of Care (Signed)
 The patient remains on AR-ICU as of time of writing. The patient has been extubated to room air. The patient is awake, alert, and oriented to person and place; assessment of time and situation are limited by the patient's level of cooperation and willingness to converse. That patient has become increasingly agitated throughout the night: often cursing and yelling at staff. The patient has been weaned off of Levophed  for now. LIJ TLC CVC remains in place. The patient remains NPO. Yale Swallow Screen was not performed by nursing overnight due to the patient's obvious vocal hoarseness, difficulty clearing secretions, and general unwillingness to cooperate with staff: I do not feel it is safe to proceed with YSS at this time and for these reasons. Chloraseptic spray ordered per standing orders for throat discomfort. Q 4 hours CBGs with SSI. The patient is receiving multiple doses of potassium replacement via CVC overnight. Daily CHG bath documented as having been given on prior shift. The patient reported pain in her hips overnight; secure chat sent to [name], NP (see new orders). Foley catheter removed at 0515 hours per protocol.    Problem: Education: Goal: Ability to describe self-care measures that may prevent or decrease complications (Diabetes Survival Skills Education) will improve Outcome: Progressing Goal: Individualized Educational Video(s) Outcome: Progressing   Problem: Coping: Goal: Ability to adjust to condition or change in health will improve Outcome: Progressing   Problem: Fluid Volume: Goal: Ability to maintain a balanced intake and output will improve Outcome: Progressing   Problem: Health Behavior/Discharge Planning: Goal: Ability to identify and utilize available resources and services will improve Outcome: Progressing Goal: Ability to manage health-related needs will improve Outcome: Progressing   Problem: Metabolic: Goal: Ability to maintain appropriate glucose  levels will improve Outcome: Progressing   Problem: Nutritional: Goal: Maintenance of adequate nutrition will improve Outcome: Progressing Goal: Progress toward achieving an optimal weight will improve Outcome: Progressing   Problem: Skin Integrity: Goal: Risk for impaired skin integrity will decrease Outcome: Progressing   Problem: Tissue Perfusion: Goal: Adequacy of tissue perfusion will improve Outcome: Progressing   Problem: Education: Goal: Ability to describe self-care measures that may prevent or decrease complications (Diabetes Survival Skills Education) will improve Outcome: Progressing Goal: Individualized Educational Video(s) Outcome: Progressing   Problem: Cardiac: Goal: Ability to maintain an adequate cardiac output will improve Outcome: Progressing   Problem: Health Behavior/Discharge Planning: Goal: Ability to identify and utilize available resources and services will improve Outcome: Progressing Goal: Ability to manage health-related needs will improve Outcome: Progressing   Problem: Fluid Volume: Goal: Ability to achieve a balanced intake and output will improve Outcome: Progressing   Problem: Metabolic: Goal: Ability to maintain appropriate glucose levels will improve Outcome: Progressing   Problem: Nutritional: Goal: Maintenance of adequate nutrition will improve Outcome: Progressing Goal: Maintenance of adequate weight for body size and type will improve Outcome: Progressing   Problem: Urinary Elimination: Goal: Ability to achieve and maintain adequate renal perfusion and functioning will improve Outcome: Progressing   Problem: Education: Goal: Knowledge of General Education information will improve Description: Including pain rating scale, medication(s)/side effects and non-pharmacologic comfort measures Outcome: Progressing   Problem: Health Behavior/Discharge Planning: Goal: Ability to manage health-related needs will improve Outcome:  Progressing   Problem: Clinical Measurements: Goal: Ability to maintain clinical measurements within normal limits will improve Outcome: Progressing Goal: Will remain free from infection Outcome: Progressing Goal: Diagnostic test results will improve Outcome: Progressing Goal: Respiratory complications will improve Outcome: Progressing Goal: Cardiovascular complication will be avoided  Outcome: Progressing   Problem: Activity: Goal: Risk for activity intolerance will decrease Outcome: Progressing   Problem: Nutrition: Goal: Adequate nutrition will be maintained Outcome: Progressing   Problem: Coping: Goal: Level of anxiety will decrease Outcome: Progressing   Problem: Elimination: Goal: Will not experience complications related to bowel motility Outcome: Progressing Goal: Will not experience complications related to urinary retention Outcome: Progressing   Problem: Pain Managment: Goal: General experience of comfort will improve and/or be controlled Outcome: Progressing   Problem: Safety: Goal: Ability to remain free from injury will improve Outcome: Progressing   Problem: Skin Integrity: Goal: Risk for impaired skin integrity will decrease Outcome: Progressing   Problem: Activity: Goal: Ability to tolerate increased activity will improve Outcome: Progressing   Problem: Respiratory: Goal: Ability to maintain a clear airway and adequate ventilation will improve Outcome: Progressing   Problem: Role Relationship: Goal: Method of communication will improve Outcome: Progressing

## 2023-12-13 LAB — CBC
HCT: 31.3 % — ABNORMAL LOW (ref 36.0–46.0)
Hemoglobin: 10.9 g/dL — ABNORMAL LOW (ref 12.0–15.0)
MCH: 30.5 pg (ref 26.0–34.0)
MCHC: 34.8 g/dL (ref 30.0–36.0)
MCV: 87.7 fL (ref 80.0–100.0)
Platelets: 156 10*3/uL (ref 150–400)
RBC: 3.57 MIL/uL — ABNORMAL LOW (ref 3.87–5.11)
RDW: 12.5 % (ref 11.5–15.5)
WBC: 9.1 10*3/uL (ref 4.0–10.5)
nRBC: 0 % (ref 0.0–0.2)

## 2023-12-13 LAB — BASIC METABOLIC PANEL WITH GFR
Anion gap: 8 (ref 5–15)
BUN: 14 mg/dL (ref 8–23)
CO2: 28 mmol/L (ref 22–32)
Calcium: 8 mg/dL — ABNORMAL LOW (ref 8.9–10.3)
Chloride: 103 mmol/L (ref 98–111)
Creatinine, Ser: 0.92 mg/dL (ref 0.44–1.00)
GFR, Estimated: >60 mL/min (ref >60)
Glucose, Bld: 186 mg/dL — ABNORMAL HIGH (ref 70–99)
Potassium: 3.6 mmol/L (ref 3.5–5.1)
Sodium: 139 mmol/L (ref 135–145)

## 2023-12-13 LAB — LEGIONELLA PNEUMOPHILA SEROGP 1 UR AG: L. pneumophila Serogp 1 Ur Ag: NEGATIVE

## 2023-12-13 LAB — GLUCOSE, CAPILLARY
Glucose-Capillary: 168 mg/dL — ABNORMAL HIGH (ref 70–99)
Glucose-Capillary: 171 mg/dL — ABNORMAL HIGH (ref 70–99)
Glucose-Capillary: 394 mg/dL — ABNORMAL HIGH (ref 70–99)
Glucose-Capillary: 342 mg/dL — ABNORMAL HIGH (ref 70–99)
Glucose-Capillary: 332 mg/dL — ABNORMAL HIGH (ref 70–99)

## 2023-12-13 LAB — MAGNESIUM: Magnesium: 2 mg/dL (ref 1.7–2.4)

## 2023-12-13 LAB — PHOSPHORUS: Phosphorus: 3.8 mg/dL (ref 2.5–4.6)

## 2023-12-13 MED ORDER — INSULIN ASPART 100 UNIT/ML IJ SOLN
3.0000 [IU] | Freq: Three times a day (TID) | INTRAMUSCULAR | Status: DC
  Administered 2023-12-13 (×2): 3 [IU] via SUBCUTANEOUS
  Filled 2023-12-13 (×2): qty 1

## 2023-12-13 MED ORDER — THIAMINE HCL 100 MG PO TABS
100.0000 mg | ORAL_TABLET | Freq: Every day | ORAL | Status: DC
  Administered 2023-12-14 – 2023-12-15 (×2): 100 mg via ORAL
  Filled 2023-12-13 (×4): qty 1

## 2023-12-13 MED ORDER — FOLIC ACID 1 MG PO TABS
1.0000 mg | ORAL_TABLET | Freq: Every day | ORAL | Status: DC
  Administered 2023-12-14 – 2023-12-15 (×2): 1 mg via ORAL
  Filled 2023-12-13 (×2): qty 1

## 2023-12-13 MED ORDER — INSULIN GLARGINE-YFGN 100 UNIT/ML ~~LOC~~ SOLN: 45.0000 [IU] | Freq: Every day | SUBCUTANEOUS | Status: DC

## 2023-12-13 MED ORDER — ENOXAPARIN SODIUM 40 MG/0.4ML IJ SOSY
40.0000 mg | PREFILLED_SYRINGE | INTRAMUSCULAR | Status: DC
  Administered 2023-12-13 – 2023-12-14 (×2): 40 mg via SUBCUTANEOUS
  Filled 2023-12-13 (×4): qty 0.4

## 2023-12-13 MED ORDER — ENSURE PLUS HIGH PROTEIN PO LIQD
237.0000 mL | Freq: Two times a day (BID) | ORAL | Status: DC
  Administered 2023-12-13: 237 mL via ORAL

## 2023-12-13 MED ORDER — KETOROLAC TROMETHAMINE 30 MG/ML IJ SOLN
30.0000 mg | Freq: Once | INTRAMUSCULAR | Status: AC
  Administered 2023-12-13: 30 mg via INTRAVENOUS
  Filled 2023-12-13: qty 1

## 2023-12-13 MED ORDER — PREDNISONE 20 MG PO TABS
40.0000 mg | ORAL_TABLET | Freq: Every day | ORAL | Status: DC
  Administered 2023-12-14: 40 mg via ORAL
  Filled 2023-12-13: qty 2

## 2023-12-13 MED ORDER — INSULIN ASPART 100 UNIT/ML IJ SOLN
0.0000 [IU] | Freq: Three times a day (TID) | INTRAMUSCULAR | Status: DC
  Administered 2023-12-13: 15 [IU] via SUBCUTANEOUS
  Administered 2023-12-13: 20 [IU] via SUBCUTANEOUS
  Filled 2023-12-13 (×2): qty 1

## 2023-12-13 MED ORDER — ONDANSETRON HCL 4 MG/2ML IJ SOLN
4.0000 mg | Freq: Four times a day (QID) | INTRAMUSCULAR | Status: DC | PRN
  Administered 2023-12-13: 4 mg via INTRAVENOUS
  Filled 2023-12-13: qty 2

## 2023-12-13 MED ORDER — INSULIN GLARGINE-YFGN 100 UNIT/ML ~~LOC~~ SOLN
8.0000 [IU] | Freq: Once | SUBCUTANEOUS | Status: AC
  Administered 2023-12-13: 8 [IU] via SUBCUTANEOUS
  Filled 2023-12-13: qty 0.08

## 2023-12-13 MED ORDER — DILTIAZEM HCL ER COATED BEADS 120 MG PO CP24
120.0000 mg | ORAL_CAPSULE | Freq: Every day | ORAL | Status: DC
  Administered 2023-12-14 – 2023-12-15 (×2): 120 mg via ORAL
  Filled 2023-12-13 (×2): qty 1

## 2023-12-13 MED ORDER — INSULIN ASPART 100 UNIT/ML IJ SOLN: 0.0000 [IU] | Freq: Three times a day (TID) | INTRAMUSCULAR | Status: DC

## 2023-12-13 MED ORDER — PANTOPRAZOLE SODIUM 40 MG PO TBEC
40.0000 mg | DELAYED_RELEASE_TABLET | Freq: Every day | ORAL | Status: DC
  Administered 2023-12-14: 40 mg via ORAL
  Filled 2023-12-13 (×2): qty 1

## 2023-12-13 MED ORDER — INSULIN GLARGINE-YFGN 100 UNIT/ML ~~LOC~~ SOLN: 40.0000 [IU] | Freq: Every day | SUBCUTANEOUS | Status: DC

## 2023-12-13 MED ORDER — INSULIN ASPART 100 UNIT/ML IJ SOLN: 3.0000 [IU] | Freq: Three times a day (TID) | INTRAMUSCULAR | Status: DC

## 2023-12-13 MED ORDER — POTASSIUM CHLORIDE CRYS ER 20 MEQ PO TBCR
40.0000 meq | EXTENDED_RELEASE_TABLET | Freq: Once | ORAL | Status: AC
  Administered 2023-12-13: 40 meq via ORAL
  Filled 2023-12-13: qty 2

## 2023-12-13 MED ORDER — ENSURE MAX PROTEIN PO LIQD
11.0000 [oz_av] | Freq: Two times a day (BID) | ORAL | Status: DC
  Administered 2023-12-13 – 2023-12-15 (×4): 11 [oz_av] via ORAL
  Filled 2023-12-13: qty 330

## 2023-12-13 MED ORDER — INSULIN ASPART 100 UNIT/ML IJ SOLN
0.0000 [IU] | Freq: Three times a day (TID) | INTRAMUSCULAR | Status: DC
  Administered 2023-12-13: 15 [IU] via SUBCUTANEOUS
  Administered 2023-12-14: 11 [IU] via SUBCUTANEOUS
  Administered 2023-12-14: 15 [IU] via SUBCUTANEOUS
  Administered 2023-12-14: 4 [IU] via SUBCUTANEOUS
  Administered 2023-12-14: 15 [IU] via SUBCUTANEOUS
  Administered 2023-12-15: 20 [IU] via SUBCUTANEOUS
  Filled 2023-12-13 (×6): qty 1

## 2023-12-13 MED ORDER — INSULIN ASPART 100 UNIT/ML IJ SOLN: 0.0000 [IU] | Freq: Every day | INTRAMUSCULAR | Status: DC

## 2023-12-13 MED ORDER — ADULT MULTIVITAMIN W/MINERALS CH
1.0000 | ORAL_TABLET | Freq: Every day | ORAL | Status: DC
  Administered 2023-12-14: 1 via ORAL
  Filled 2023-12-13: qty 1

## 2023-12-13 NOTE — Progress Notes (Signed)
 Nutrition Follow Up Note   DOCUMENTATION CODES:   Obesity unspecified  INTERVENTION:   Ensure Max protein supplement po BID, each supplement provides 150kcal and 30g of protein.  MVI, folic acid  and thiamine  po daily   Pt remains at high refeed risk; recommend monitor potassium, magnesium  and phosphorus labs daily until stable  Daily weights   NUTRITION DIAGNOSIS:   Inadequate oral intake related to inability to eat (pt sedated and ventilated) as evidenced by NPO status. -resolved   GOAL:   Patient will meet greater than or equal to 90% of their needs -not met   MONITOR:   PO intake, Supplement acceptance, Labs, Weight trends, I & O's, Skin  ASSESSMENT:   63 y/o female with h/o drug and etoh abuse, bipolar disorder, HTN, HLD, GERD, SVT, asthma, CKD III, anxiety, depression and C diff who is admitted with DKA, aspiration and AKI.  Pt extubated yesterday and initiated on a diet. Pt ate 100% of her breakfast this morning. Pt is ordered for Ensure Plus; RD will change this to Ensure Max in setting of hyperglycemia. Pt remains at refeed risk. Per chart, pt is down ~5lbs since admission. Pt +7.2L on her I & Os.   Medications reviewed and include: lovenox , heparin , insulin , solu-medrol , protonix , prednisone  Labs reviewed: K 3.6 wnl, P 3.8 wnl, Mg 2.0 wnl Hgb 10.9(L), Hct 31.3(L) Cbgs- 394, 171, 168 x 24 hrs   UOP-   Diet Order:   Diet Order             Diet Carb Modified Fluid consistency: Thin; Room service appropriate? Yes  Diet effective now                  EDUCATION NEEDS:   No education needs have been identified at this time  Skin:  Skin Assessment: Reviewed RN Assessment  Last BM:  6/3- type 6  Height:   Ht Readings from Last 1 Encounters:  12/11/23 5\' 2"  (1.575 m)    Weight:   Wt Readings from Last 1 Encounters:  12/13/23 76.1 kg    Ideal Body Weight:  50 kg  BMI:  Body mass index is 30.69 kg/m.  Estimated Nutritional Needs:    Kcal:  1700-1900kcal/day  Protein:  85-95g/day  Fluid:  1.7-1.9L/day  Torrance Freestone MS, RD, LDN If unable to be reached, please send secure chat to "RD inpatient" available from 8:00a-4:00p daily

## 2023-12-13 NOTE — Consult Note (Signed)
 PHARMACY CONSULT NOTE - ELECTROLYTES  Pharmacy Consult for Electrolyte Monitoring and Replacement   Recent Labs: Height: 5\' 2"  (157.5 cm) Weight: 76.1 kg (167 lb 12.3 oz) IBW/kg (Calculated) : 50.1 Estimated Creatinine Clearance: 60.6 mL/min (by C-G formula based on SCr of 0.92 mg/dL). Potassium (mmol/L)  Date Value  12/13/2023 3.6   Magnesium  (mg/dL)  Date Value  03/47/4259 2.0   Calcium  (mg/dL)  Date Value  56/38/7564 8.0 (L)   Albumin (g/dL)  Date Value  33/29/5188 4.0   Phosphorus (mg/dL)  Date Value  41/66/0630 3.8   Sodium (mmol/L)  Date Value  12/13/2023 139    Assessment  Pamela Lane is a 63 y.o. female presenting with shortness of breath. PMH significant for DKA, HTN, HLD, asthma, anxiety, depression, and polysubstance abuse. Pharmacy has been consulted to monitor and replace electrolytes.  Diet: NPO MIVF: N/A Pertinent medications: N/A  Goal of Therapy: Electrolytes WNL  Plan:  K 3.6, give Kcl 40 mEq PO x 1 if able to pass swallow screen Continue to follow along  Thank you for allowing pharmacy to be a part of this patient's care.  Page Boast 12/13/2023 7:43 AM

## 2023-12-13 NOTE — Discharge Instructions (Signed)

## 2023-12-13 NOTE — Progress Notes (Signed)
 NAME:  DELCIA SPITZLEY, MRN:  [id number], DOB:  [birth year], LOS: 2 ADMISSION DATE:  12/11/2023, CONSULTATION DATE: 12/11/2023  History of Present Illness:  63 y.o female with significant PMH of uncontrolled diabetes mellitus, recurrent DKA, hypertension, hyperlipidemia, asthma, anxiety and depression, bipolar disorder, CKD stage III, SVT, C. difficile colitis and polysubstance abuse who presented to the ED with chief complaints of breath and hypoxia with a glucose reading of high per EMS    ED Course: Initial vital signs showed HR of 180 beats/minute, BP133/83 mm Hg, the RR 30 breaths/minute, and the oxygen saturation 98% on vent and a temperature of 98.74F (37C).  Pertinent Labs/Diagnostics Findings: Na+/ K+: 132/7.0 Glucose: 683 BUN/Cr.:27/2.01  WBC: 12.8 K/L  UDS +cocaine UA+ketones Beta Hydroxy elevated COVID PCR: Negative,  VBG: pO2 46; pCO2 27; pH 6.95;  HCO3 5.4, %O2 Sat 68.8.  CXR> see results Medication administered in the GM:WNUUVOZ went into svt sustaining rates upto 190"s with unstable BP, received Adenosine  6mg , Defibrillated x 10 and received 2 amps of sodium Bicarb, calcum gluconate, IV insulin  10 units x 2 and IV Metoprolol  with rate improvement to NSR in the 90'-100s. Disposition:ICU  Pertinent  Medical History  Uncontrolled diabetes mellitus, recurrent DKA, hypertension, hyperlipidemia, asthma, anxiety and depression, bipolar disorder, CKD stage III, SVT, C. difficile colitis and polysubstance abuse   Micro Data:  MRSA PCR 06/01>>negative  Resp panel by RT-PCR 06/01>>negative  Urine 06/01>> Blood x2 06/01>> Strep pneum ur antigen 06/01>>negative   Significant Hospital Events: Including procedures, antibiotic start and stop dates in addition to other pertinent events   06/01: Admit to ICU with Acute respiratory failure requiring intubation and found to have DKA in the setting of medication non-compliance and cocaine use.  06/02: Pt successfully extubated to  RA 06/03: Pt intermittently agitated overnight.  Pt now calm stable on RA and stated she now wants help with polysubstance abuse.  V[facility] will transfer to stepdown unit and if she remains stable will transfer service to [facility] to assume care on 06/04  Interim History / Subjective:  As outlined above   Objective    Blood pressure (!) 152/108, pulse 95, temperature 98.6 F (37 C), temperature source Axillary, resp. rate 11, height 5\' 2"  (1.575 m), weight 76.1 kg, SpO2 97%. CVP:  [2 mmHg-12 mmHg] 12 mmHg  Vent Mode: PSV;CPAP FiO2 (%):  [25 %] 25 % PEEP:  [5 cmH20] 5 cmH20 Pressure Support:  [5 cmH20] 5 cmH20   Intake/Output Summary (Last 24 hours) at 12/13/2023 0735 Last data filed at 12/13/2023 0700 Gross per 24 hour  Intake 1559.7 ml  Output 830 ml  Net 729.7 ml   Filed Weights   12/11/23 0607 12/12/23 0400 12/13/23 0500  Weight: 78 kg 76.9 kg 76.1 kg    Examination: General: Acutely-ill appearing female, NAD on RA HENT: Supple, no JVD  Lungs: Faint rhonchi throughout, even, non labored Cardiovascular: NSR, s1s2, no m/r/g, 2+ radial/2+ distal pulses, no edema  Abdomen: +BS x4, soft, obese, non tender  Extremities: Normal bulk and tone Neuro: Alert and oriented, following commands, PERRLA  GU: Foley removed   Resolved problem list  Metabolic acidosis  Mechanical intubation  Lactic acidosis   Assessment and Plan   #Acute toxic metabolic encephalopathy  #Cocaine abuse  - Correct metabolic derangements  - Polysubstance abuse cessation counseling - TOC consulted pt requesting assistance with polysubstance abuse cessation   #Hypotension likely sedation related~resolved  #Elevated troponin likely secondary to demand ischemia  Hx: Essential HTN  -  Continuous telemetry monitoring  - Hold outpatient cardizem  for now  - Maintain map 65 or higher   #Acute respiratory failure #Possible aspiration  Hx: Asthma  - Supplemental O2 for dyspnea and/or hypoxia  - Maintain O2 sats 92%  or higher  - Scheduled and prn bronchodilator therapy  - IV steroids - Intermittent CXR and ABG's - Aggressive pulmonary hygiene   #Mild acute kidney injury likely secondary ATN~resolved   #Hypophosphatemia  #Hypomagnesia   - Trend BMP  - Replace electrolytes as indicated  - Strict I&O's - Avoid nephrotoxic agents as able   #Leukocytosis~resolved without obvious signs of infection  - Trend WBC and monitor fever curve  - Follow cultures  - With no signs of infection will discontinue abx, however if pt becomes febrile and develops leukocytosis will restart abx   #Diabetic ketoacidosis  #Type II diabetes mellitus  - Continue SSI and scheduled semglee   - Follow hyper/hypoglycemic protocol  - Target CBG's 140 to 180 - Diabetes coordinator consulted appreciate input   Best Practice (right click and "Reselect all SmartList Selections" daily)   Diet/type: Once able to pass bedside swallow will order diet  DVT prophylaxis prophylactic heparin   Pressure ulcer(s): N/A GI prophylaxis: PPI Lines: Central line if pt remains stable will discontinue today  Foley:  N/A Code Status:  full code Last date of multidisciplinary goals of care discussion [12/13/2023]  06/03: Updated pts daughter Vicky Grange via telephone regarding pts condition and current plan of care.  All questions were answered.  Also updated pt at bedside  Labs   CBC: Recent Labs  Lab 12/11/23 0349 12/11/23 0715 12/12/23 0439 12/13/23 0427  WBC 12.8* 25.0* 10.0 9.1  NEUTROABS 9.8*  --   --   --   HGB 16.7* 14.7 11.8* 10.9*  HCT 52.9* 44.0 33.4* 31.3*  MCV 95.8 92.4 85.6 87.7  PLT 348 347 187 156    Basic Metabolic Panel: Recent Labs  Lab 12/11/23 1734 12/11/23 2202 12/12/23 0439 12/12/23 0944 12/12/23 1739 12/13/23 0427  NA 137 139 141 140  --  139  K 3.7 3.1* 3.5 2.8* 2.9* 3.6  CL 104 106 104 104  --  103  CO2 24 26 28 30   --  28  GLUCOSE 266* 152* 107* 155*  --  186*  BUN 28* 27* 23 21  --  14   CREATININE 1.27* 1.15* 1.08* 1.03*  --  0.92  CALCIUM  8.2* 8.3* 8.2* 8.2*  --  8.0*  MG  --   --  1.8  --   --  2.0  PHOS  --   --  1.6*  --  2.6 3.8   GFR: Estimated Creatinine Clearance: 60.6 mL/min (by C-G formula based on SCr of 0.92 mg/dL). Recent Labs  Lab 12/11/23 0349 12/11/23 0715 12/11/23 0950 12/12/23 0439 12/12/23 0943 12/13/23 0427  WBC 12.8* 25.0*  --  10.0  --  9.1  LATICACIDVEN  --  3.9* 2.1*  --  1.3  --     Liver Function Tests: Recent Labs  Lab 12/11/23 0349 12/11/23 0715  AST 15 20  ALT 15 17  ALKPHOS 130* 119  BILITOT 1.4* 1.8*  PROT 9.0* 7.8  ALBUMIN 4.7 4.0   Recent Labs  Lab 12/11/23 0715  LIPASE 23   No results for input(s): "AMMONIA" in the last 168 hours.  ABG    Component Value Date/Time   PHART 7.34 (L) 10/08/2020 0805   PCO2ART 40 10/08/2020 0805   PO2ART  78 (L) 10/08/2020 0805   HCO3 29.9 (H) 12/12/2023 0050   TCO2 15 01/12/2016 2315   ACIDBASEDEF 3.0 (H) 12/11/2023 1526   O2SAT 74.8 12/12/2023 0050     Coagulation Profile: Recent Labs  Lab 12/11/23 0715  INR 1.2    Cardiac Enzymes: No results for input(s): "CKTOTAL", "CKMB", "CKMBINDEX", "TROPONINI" in the last 168 hours.  HbA1C: Hgb A1c MFr Bld  Date/Time Value Ref Range Status  10/12/2023 07:07 AM 14.3 (H) 4.8 - 5.6 % Final    Comment:    (NOTE) Pre diabetes:          5.7%-6.4%  Diabetes:              >6.4%  Glycemic control for   <7.0% adults with diabetes   04/11/2023 03:28 AM >15.5 (H) 4.8 - 5.6 % Final    Comment:    (NOTE)         Prediabetes: 5.7 - 6.4         Diabetes: >6.4         Glycemic control for adults with diabetes: <7.0     CBG: Recent Labs  Lab 12/12/23 1522 12/12/23 1951 12/12/23 2314 12/13/23 0317 12/13/23 0722  GLUCAP 221* 131* 131* 168* 171*    Review of Systems:   Unable to assess pt mechanically intubated  Past Medical History:  She,  has a past medical history of Asthma, Clostridium difficile colitis, Diabetes  mellitus without complication (HCC), Hypertension, and Mental disorder.   Surgical History:   Past Surgical History:  Procedure Laterality Date   ABDOMINAL HYSTERECTOMY       Social History:   reports that she has been smoking cigarettes. She has never used smokeless tobacco. She reports that she does not currently use alcohol. She reports current drug use. Drugs: Marijuana and Cocaine.   Family History:  Her family history includes Hypertension in her mother.   Allergies No Known Allergies   Home Medications  Prior to Admission medications   Medication Sig Start Date End Date Taking? Authorizing Provider  atorvastatin  (LIPITOR) 40 MG tablet Take 1 tablet (40 mg total) by mouth at bedtime. 09/16/23 12/11/23 Yes Aisha Hove, MD  buPROPion  (WELLBUTRIN  SR) 150 MG 12 hr tablet Take 1 tablet (150 mg total) by mouth 2 (two) times daily. 09/16/23 12/11/23 Yes Aisha Hove, MD  diltiazem  (CARDIZEM  CD) 120 MG 24 hr capsule Take 120 mg by mouth daily. 09/16/23  Yes [provider]  gabapentin  (NEURONTIN ) 300 MG capsule Take 1 capsule (300 mg total) by mouth 3 (three) times daily. 10/14/23 12/11/23 Yes Dezii, Alexandra, DO  insulin  glargine (LANTUS ) 100 UNIT/ML Solostar Pen Inject 55 Units into the skin daily. 10/14/23 12/11/23 Yes Dezii, Alexandra, DO  Accu-Chek Softclix Lancets lancets Use 3 (three) times daily to check blood sugar 09/16/23   Aisha Hove, MD  Blood Glucose Monitoring Suppl (BLOOD GLUCOSE MONITOR SYSTEM) w/Device KIT Use 3 (three) times daily to check blood sugar. 09/16/23   Aisha Hove, MD  Dextromethorphan -guaiFENesin  20-200 MG/20ML LIQD Take 10 mLs by mouth every 4 (four) hours as needed. 09/16/23   Aisha Hove, MD  Glucose Blood (BLOOD GLUCOSE TEST STRIPS) STRP Use 3 (three) times daily to check blood sugar. 09/16/23   Aisha Hove, MD  insulin  aspart (NOVOLOG ) 100 UNIT/ML FlexPen Inject 10 Units into the skin 3 (three) times daily  with meals. Patient not taking: Reported on 10/31/2023 09/16/23   Aisha Hove, MD  Insulin  Pen Needle 31G X 8 MM MISC  Use 5 times daily with Lantus  and Novolog  09/16/23   Aisha Hove, MD  Multiple Vitamin (MULTIVITAMIN WITH MINERALS) TABS tablet Take 1 tablet by mouth daily. 09/16/23   Aisha Hove, MD  omeprazole  (PRILOSEC ) 20 MG capsule Take 1 capsule (20 mg total) by mouth daily. 09/16/23 10/16/23  Aisha Hove, MD  sitaGLIPtin  (JANUVIA ) 100 MG tablet Take 1 tablet (100 mg total) by mouth daily. 09/16/23 10/16/23  Aisha Hove, MD     Critical care time: 35 minutes      Janey Meek, AGNP  Pulmonary/Critical Care Pager [phone] (please enter 7 digits) PCCM Consult Pager [phone] (please enter 7 digits)

## 2023-12-13 NOTE — Evaluation (Signed)
 Physical Therapy Evaluation Patient Details Name: Pamela Lane MRN: [id number] DOB: 31-Oct-1960 Today's Date: 12/13/2023  History of Present Illness  40 F with history of insulin  dependent diabetes and crack cocaine abuse who presented with AMS secondary to DKA.  Clinical Impression  Pt admitted with above diagnosis. Pt currently with functional limitations due to the deficits listed below (see PT Problem List). Pt received supine in bed agreeable to PT. Reports PTA being indep with gait and ADL's. Does not drive and relies on transportation to get to appointments.   To date, pt is independent with bed mobility needing HHA+1 to stand at bedside. Pt with acute gait deviations with mild R/L drifting and occasional staggering ambulating in hallway needing regular HHA and CGA for support. HR elevating to 126 BPM with mobility thus deferring further gait returning to room sitting EOB. Dicussed need for RW due to acute balance/gait abnormality but pt adamantly declining need for DME. HR returns to low 100's at rest with all needs in reach. Pt will benefit from f/u skilled PT services to address increased falls risk from acute admission.      If plan is discharge home, recommend the following: A little help with walking and/or transfers;Assistance with cooking/housework;Assist for transportation;Help with stairs or ramp for entrance   Can travel by private vehicle        Equipment Recommendations Rolling walker (2 wheels)  Recommendations for Other Services       Functional Status Assessment Patient has had a recent decline in their functional status and demonstrates the ability to make significant improvements in function in a reasonable and predictable amount of time.     Precautions / Restrictions Precautions Precautions: Fall Recall of Precautions/Restrictions: Impaired Restrictions Weight Bearing Restrictions Per Provider Order: No      Mobility  Bed Mobility Overal bed  mobility: Independent               Patient Response: Cooperative  Transfers Overall transfer level: Needs assistance Equipment used: 1 person hand held assist Transfers: Sit to/from Stand Sit to Stand: Contact guard assist           General transfer comment: HHA+1 with pt keeping arm wrapped around PT's hip    Ambulation/Gait Ambulation/Gait assistance: Contact guard assist Gait Distance (Feet): 80 Feet   Gait Pattern/deviations: Step-through pattern, Drifts right/left, Narrow base of support, Staggering right, Staggering left       General Gait Details: Pt unsteady with R/L drifting needing regualr CGA and HHA for gait. LImited in distance due to HR  Stairs            Wheelchair Mobility     Tilt Bed Tilt Bed Patient Response: Cooperative  Modified Rankin (Stroke Patients Only)       Balance Overall balance assessment: Needs assistance Sitting-balance support: No upper extremity supported, Feet supported Sitting balance-Leahy Scale: Good     Standing balance support: Single extremity supported, During functional activity Standing balance-Leahy Scale: Poor Standing balance comment: needs at least SUE support at bedside                             Pertinent Vitals/Pain Pain Assessment Pain Assessment: No/denies pain    Home Living Family/patient expects to be discharged to:: Private residence Living Arrangements: Spouse/significant other Available Help at Discharge: Family;Available PRN/intermittently Type of Home: House Home Access: Stairs to enter Entrance Stairs-Rails: Right;Left;Can reach both Entrance Stairs-Number of Steps: 3  Home Layout: One level Home Equipment: None      Prior Function Prior Level of Function : Independent/Modified Independent               ADLs Comments: relies on transportation     Extremity/Trunk Assessment   Upper Extremity Assessment Upper Extremity Assessment: Overall WFL for  tasks assessed    Lower Extremity Assessment Lower Extremity Assessment: Generalized weakness       Communication   Communication Communication: No apparent difficulties    Cognition Arousal: Alert Behavior During Therapy: WFL for tasks assessed/performed   PT - Cognitive impairments: No apparent impairments                       PT - Cognition Comments: does not appear aware of acute balance deficits Following commands: Intact       Cueing       General Comments General comments (skin integrity, edema, etc.): HR 112 at rest, 123-126 BPM with gait    Exercises Other Exercises Other Exercises: dicussed acute balance deficits and benefits of RW for stability and reduced falls risk.   Assessment/Plan    PT Assessment Patient needs continued PT services  PT Problem List Decreased strength;Decreased mobility;Decreased balance       PT Treatment Interventions DME instruction;Therapeutic exercise;Gait training;Balance training;Stair training;Neuromuscular re-education;Functional mobility training;Therapeutic activities;Patient/family education    PT Goals (Current goals can be found in the Care Plan section)  Acute Rehab PT Goals Patient Stated Goal: to go to drug rehab PT Goal Formulation: With patient Time For Goal Achievement: 12/27/23 Potential to Achieve Goals: Fair    Frequency Min 2X/week     Co-evaluation               AM-PAC PT "6 Clicks" Mobility  Outcome Measure Help needed turning from your back to your side while in a flat bed without using bedrails?: None Help needed moving from lying on your back to sitting on the side of a flat bed without using bedrails?: None Help needed moving to and from a bed to a chair (including a wheelchair)?: A Little Help needed standing up from a chair using your arms (e.g., wheelchair or bedside chair)?: A Little Help needed to walk in hospital room?: A Little Help needed climbing 3-5 steps with a  railing? : A Little 6 Click Score: 20    End of Session   Activity Tolerance: Patient tolerated treatment well Patient left: with bed alarm set (seated EOB) Nurse Communication: Mobility status PT Visit Diagnosis: Unsteadiness on feet (R26.81);Muscle weakness (generalized) (M62.81);Difficulty in walking, not elsewhere classified (R26.2)    Time: 0932-3557 PT Time Calculation (min) (ACUTE ONLY): 13 min   Charges:   PT Evaluation $PT Eval Low Complexity: 1 Low   PT General Charges $$ ACUTE PT VISIT: 1 Visit         Marc Senior. Fairly IV, PT, DPT Physical Therapist- [place]  [facility] 12/13/2023, 3:12 PM

## 2023-12-14 DIAGNOSIS — J9602 Acute respiratory failure with hypercapnia: Secondary | ICD-10-CM

## 2023-12-14 DIAGNOSIS — J9601 Acute respiratory failure with hypoxia: Principal | ICD-10-CM

## 2023-12-14 DIAGNOSIS — F1410 Cocaine abuse, uncomplicated: Secondary | ICD-10-CM

## 2023-12-14 DIAGNOSIS — R4586 Emotional lability: Secondary | ICD-10-CM | POA: Diagnosis not present

## 2023-12-14 DIAGNOSIS — E669 Obesity, unspecified: Secondary | ICD-10-CM

## 2023-12-14 DIAGNOSIS — G9341 Metabolic encephalopathy: Secondary | ICD-10-CM

## 2023-12-14 DIAGNOSIS — R7989 Other specified abnormal findings of blood chemistry: Secondary | ICD-10-CM

## 2023-12-14 DIAGNOSIS — E0810 Diabetes mellitus due to underlying condition with ketoacidosis without coma: Secondary | ICD-10-CM

## 2023-12-14 DIAGNOSIS — E878 Other disorders of electrolyte and fluid balance, not elsewhere classified: Secondary | ICD-10-CM

## 2023-12-14 DIAGNOSIS — E876 Hypokalemia: Secondary | ICD-10-CM

## 2023-12-14 LAB — BASIC METABOLIC PANEL WITH GFR
Anion gap: 10 (ref 5–15)
BUN: 18 mg/dL (ref 8–23)
CO2: 23 mmol/L (ref 22–32)
Calcium: 8.1 mg/dL — ABNORMAL LOW (ref 8.9–10.3)
Chloride: 103 mmol/L (ref 98–111)
Creatinine, Ser: 0.9 mg/dL (ref 0.44–1.00)
GFR, Estimated: >60 mL/min (ref >60)
Glucose, Bld: 337 mg/dL — ABNORMAL HIGH (ref 70–99)
Potassium: 4.1 mmol/L (ref 3.5–5.1)
Sodium: 136 mmol/L (ref 135–145)

## 2023-12-14 LAB — CBC
HCT: 31.5 % — ABNORMAL LOW (ref 36.0–46.0)
Hemoglobin: 10.7 g/dL — ABNORMAL LOW (ref 12.0–15.0)
MCH: 29.9 pg (ref 26.0–34.0)
MCHC: 34 g/dL (ref 30.0–36.0)
MCV: 88 fL (ref 80.0–100.0)
Platelets: 165 10*3/uL (ref 150–400)
RBC: 3.58 MIL/uL — ABNORMAL LOW (ref 3.87–5.11)
RDW: 12.5 % (ref 11.5–15.5)
WBC: 7.5 10*3/uL (ref 4.0–10.5)
nRBC: 0 % (ref 0.0–0.2)

## 2023-12-14 LAB — GLUCOSE, CAPILLARY
Glucose-Capillary: 332 mg/dL — ABNORMAL HIGH (ref 70–99)
Glucose-Capillary: 297 mg/dL — ABNORMAL HIGH (ref 70–99)
Glucose-Capillary: 327 mg/dL — ABNORMAL HIGH (ref 70–99)
Glucose-Capillary: 303 mg/dL — ABNORMAL HIGH (ref 70–99)
Glucose-Capillary: 180 mg/dL — ABNORMAL HIGH (ref 70–99)

## 2023-12-14 LAB — PHOSPHORUS: Phosphorus: 3.5 mg/dL (ref 2.5–4.6)

## 2023-12-14 LAB — MAGNESIUM: Magnesium: 2.2 mg/dL (ref 1.7–2.4)

## 2023-12-14 MED ORDER — HYDROXYZINE HCL 10 MG PO TABS
10.0000 mg | ORAL_TABLET | Freq: Three times a day (TID) | ORAL | Status: DC | PRN
  Administered 2023-12-14 – 2023-12-15 (×3): 10 mg via ORAL
  Filled 2023-12-14 (×4): qty 1

## 2023-12-14 MED ORDER — ATORVASTATIN CALCIUM 20 MG PO TABS
40.0000 mg | ORAL_TABLET | Freq: Every day | ORAL | Status: DC
  Administered 2023-12-14 – 2023-12-15 (×2): 40 mg via ORAL
  Filled 2023-12-14 (×2): qty 2

## 2023-12-14 MED ORDER — BUPROPION HCL ER (SR) 150 MG PO TB12
150.0000 mg | ORAL_TABLET | Freq: Two times a day (BID) | ORAL | Status: DC
  Administered 2023-12-14 – 2023-12-15 (×3): 150 mg via ORAL
  Filled 2023-12-14 (×3): qty 1

## 2023-12-14 MED ORDER — INSULIN ASPART 100 UNIT/ML IJ SOLN
5.0000 [IU] | Freq: Three times a day (TID) | INTRAMUSCULAR | Status: DC
  Administered 2023-12-14 (×3): 5 [IU] via SUBCUTANEOUS
  Filled 2023-12-14 (×3): qty 1

## 2023-12-14 MED ORDER — INSULIN ASPART 100 UNIT/ML IJ SOLN
10.0000 [IU] | Freq: Once | INTRAMUSCULAR | Status: AC
  Administered 2023-12-14: 10 [IU] via SUBCUTANEOUS
  Filled 2023-12-14: qty 1

## 2023-12-14 MED ORDER — GABAPENTIN 300 MG PO CAPS
300.0000 mg | ORAL_CAPSULE | Freq: Three times a day (TID) | ORAL | Status: DC
  Administered 2023-12-14 – 2023-12-15 (×3): 300 mg via ORAL
  Filled 2023-12-14 (×3): qty 1

## 2023-12-14 MED ORDER — ACETAMINOPHEN 325 MG PO TABS
650.0000 mg | ORAL_TABLET | Freq: Four times a day (QID) | ORAL | Status: DC | PRN
  Administered 2023-12-14: 650 mg via ORAL
  Filled 2023-12-14: qty 2

## 2023-12-14 MED ORDER — INSULIN GLARGINE-YFGN 100 UNIT/ML ~~LOC~~ SOLN
55.0000 [IU] | Freq: Every day | SUBCUTANEOUS | Status: DC
  Administered 2023-12-14 – 2023-12-15 (×2): 55 [IU] via SUBCUTANEOUS
  Filled 2023-12-14 (×2): qty 0.55

## 2023-12-14 MED ORDER — OXYCODONE HCL 5 MG PO TABS
5.0000 mg | ORAL_TABLET | Freq: Four times a day (QID) | ORAL | Status: DC | PRN
  Administered 2023-12-14 – 2023-12-15 (×2): 5 mg via ORAL
  Filled 2023-12-14 (×2): qty 1

## 2023-12-14 NOTE — Assessment & Plan Note (Signed)
 BMI 31.73

## 2023-12-14 NOTE — Assessment & Plan Note (Signed)
 Secondary to drug use

## 2023-12-14 NOTE — Progress Notes (Signed)
 Introduced patient to role of Statistician. Multiple family members present in room--intake questions deferred at this time.

## 2023-12-14 NOTE — TOC Initial Note (Signed)
 Transition of Care [facility]) - Initial/Assessment Note    Patient Details  Name: Pamela Lane MRN: [id number] Date of Birth: 09/03/1960  Transition of Care [employer]) CM/SW Contact:    Crayton Docker, RN 12/14/2023, 10:33 AM  Clinical Narrative:                  CM to patient's room regarding TOC screening assessment. CM introduced case management role and discharge care planning process. Patient verbalized understanding and agreement with screening interview. CM and patient discussed therapy recommendations. Patient states she is going to rehab and she cannot return home. Patient states she does not feel safe because of "Drugs, Drugs, Drugs. And if I go home, I will return dead."  CM provided Medicare.gov list of home health agencies and SNFs to review.   CM secure message to Sam Creighton, [employer] regarding PT recommendations for rolling walker for delivery to bedside.  CM unsuccessful with home health agencies due to not accepting patient's insurance. CM will alert Dr. Clelia Current.  Expected Discharge Plan: Home w Home Health Services Barriers to Discharge: Continued Medical Work up   Patient Goals and CMS Choice    SNF     Expected Discharge Plan and Services    Home with home health PT DME: rolling walker   Living arrangements for the past 2 months: Single Family Home                  Prior Living Arrangements/Services Living arrangements for the past 2 months: Single Family Home Lives with:: Spouse Patient language and need for interpreter reviewed:: No Do you feel safe going back to the place where you live?: No   "I don't want to go home." When asking why "Drugs, Drugs, Drugs. I am going to die if I don't get help."  Need for Family Participation in Patient Care: Yes (Comment) Care giver support system in place?: Yes (comment)   Criminal Activity/Legal Involvement Pertinent to Current Situation/Hospitalization: No - Comment as needed  Activities of Daily Living   ADL  Screening (condition at time of admission) Independently performs ADLs?: No Does the patient have a NEW difficulty with bathing/dressing/toileting/self-feeding that is expected to last >3 days?: Yes (Initiates electronic notice to provider for possible OT consult) Does the patient have a NEW difficulty with getting in/out of bed, walking, or climbing stairs that is expected to last >3 days?: Yes (Initiates electronic notice to provider for possible PT consult) Does the patient have a NEW difficulty with communication that is expected to last >3 days?: No Is the patient deaf or have difficulty hearing?: No Does the patient have difficulty seeing, even when wearing glasses/contacts?: No Does the patient have difficulty concentrating, remembering, or making decisions?: Yes  Permission Sought/Granted Permission sought to share information with : Case Manager, Family Supports Permission granted to share information with : Yes, Verbal Permission Granted  Share Information with NAME: Latonya Dennis(Daughter)/John Pettaway(husband)     Permission granted to share info w Relationship: yes  Permission granted to share info w Contact Information: yes  Emotional Assessment Appearance:: Appears older than stated age Attitude/Demeanor/Rapport: Engaged Affect (typically observed): Calm Orientation: : Oriented to Self, Oriented to Place, Oriented to  Time, Oriented to Situation Alcohol / Substance Use: Illicit Drugs, Tobacco Use (per chart reveiw, crack cocaine abuse, marijuana use and cigarette use)    Admission diagnosis:  Hyperkalemia [E87.5] Hypoxia [R09.02] Acute respiratory failure with hypoxia (HCC) [J96.01] AKI (acute kidney injury) (HCC) [N17.9] Diabetic ketoacidosis with coma  associated with type 2 diabetes mellitus (HCC) [E11.11] Acute hypoxic on chronic hypercapnic respiratory failure (HCC) [J96.01, J96.12] Patient Active Problem List   Diagnosis Date Noted   Acute hypoxic on chronic  hypercapnic respiratory failure (HCC) 12/11/2023   Acute respiratory failure with hypoxia (HCC) 10/12/2023   Sepsis (HCC) 07/28/2023   Nausea, vomiting and diarrhea 08/21/2022   Chronic kidney disease, stage 3a (HCC) 08/21/2022   Polysubstance abuse (HCC) 08/21/2022   Anxiety and depression 08/21/2022   UTI (urinary tract infection) 08/21/2022   Severe sepsis (HCC) 03/13/2022   SIRS (systemic inflammatory response syndrome) (HCC) 01/11/2021   Hypotension 01/11/2021   Abdominal pain 01/11/2021   Chest pain 01/11/2021   HLD (hyperlipidemia) 01/11/2021   Asthma 01/11/2021   GERD (gastroesophageal reflux disease) 01/11/2021   Hyperkalemia 01/11/2021   Alcohol abuse 01/11/2021   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   Acute metabolic encephalopathy 01/11/2021   Cocaine abuse (HCC)    Acute respiratory distress    SVT (supraventricular tachycardia) (HCC)    Leukocytosis    Hypokalemia    Hypophosphatemia    DKA (diabetic ketoacidosis) (HCC) 08/27/2020   Tobacco abuse 10/04/2019   DKA, type 2 (HCC) 10/02/2019   Essential hypertension 10/02/2019   Bipolar disorder (HCC) 01/17/2019   Acute pancreatitis 05/15/2016   AKI (acute kidney injury) (HCC)    Diabetic ketoacidosis without coma associated with type 2 diabetes mellitus (HCC)    Hyperglycemia due to type 2 diabetes mellitus (HCC) 01/13/2016   Acute UTI 01/13/2016   DKA (diabetic ketoacidosis) (HCC) 01/08/2016   Hyponatremia 01/08/2016   Dehydration 01/08/2016   Urinary tract infection 01/08/2016   Upper abdominal pain 01/08/2016   Nausea & vomiting 01/08/2016   PCP:  [facility]:   [facility] - [place], [place] - [address] [place] [address] Phone: [phone] Fax: [phone]  [facility] [address] [place] [address] Phone: [phone] Fax: [phone]     Social  Drivers of Health (SDOH) Social History: SDOH Screenings   Food Insecurity: No Food Insecurity (10/12/2023)  Housing: Low Risk  (10/12/2023)  Transportation Needs: Patient Unable To Answer (12/11/2023)  Utilities: Not At Risk (10/12/2023)  Depression (PHQ2-9): Low Risk  (09/21/2023)  Social Connections: Unknown (10/12/2023)  Recent Concern: Social Connections - Moderately Isolated (09/12/2023)  Tobacco Use: High Risk (12/11/2023)   SDOH Interventions:     Readmission Risk Interventions    10/13/2023   11:37 AM 09/15/2023    9:56 AM 04/11/2023    2:42 PM  Readmission Risk Prevention Plan  Transportation Screening Complete Complete Complete  PCP or Specialist Appt within 3-5 Days  Complete Complete  HRI or Home Care Consult  Complete   Social Work Consult for Recovery Care Planning/Counseling  Complete Complete  Palliative Care Screening  Not Applicable Not Applicable  Medication Review [employer]) Complete Referral to Pharmacy Complete  PCP or Specialist appointment within 3-5 days of discharge Complete    SW Recovery Care/Counseling Consult Complete    Palliative Care Screening Not Applicable    Skilled Nursing Facility Not Applicable
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
## 2023-12-14 NOTE — Assessment & Plan Note (Signed)
 Patient's mood better today.  She had a good conversation with psychiatrist yesterday.  She will continue Wellbutrin  for now.  Consider mood stabilizer as outpatient.

## 2023-12-14 NOTE — Assessment & Plan Note (Signed)
 Resolved

## 2023-12-14 NOTE — Assessment & Plan Note (Signed)
 Crack cocaine abuse.  Patient to look into rehabs at home.  Patient states that her sister is going to come into town tomorrow to help support her.  Patient confident that she will go home and stay away from the drugs.

## 2023-12-14 NOTE — Plan of Care (Signed)

## 2023-12-14 NOTE — Hospital Course (Signed)
 63 y.o female with significant PMH of uncontrolled diabetes mellitus, recurrent DKA, hypertension, hyperlipidemia, asthma, anxiety and depression, bipolar disorder, CKD stage III, SVT, C. difficile colitis and polysubstance abuse who presented to the ED with chief complaints of breath and hypoxia with a glucose reading of high per EMS    ED Course: Initial vital signs showed HR of 180 beats/minute, BP133/83 mm Hg, the RR 30 breaths/minute, and the oxygen saturation 98% on vent and a temperature of 98.107F (37C).  Pertinent Labs/Diagnostics Findings: Na+/ K+: 132/7.0 Glucose: 683 BUN/Cr.:27/2.01  WBC: 12.8 K/L  UDS +cocaine UA+ketones Beta Hydroxy elevated COVID PCR: Negative,  VBG: pO2 46; pCO2 27; pH 6.95;  HCO3 5.4, %O2 Sat 68.8.  CXR> see results Medication administered in the ZO:XWRUEAV went into svt sustaining rates upto 190"s with unstable BP, received Adenosine  6mg , Defibrillated x 10 and received 2 amps of sodium Bicarb, calcum gluconate, IV insulin  10 units x 2 and IV Metoprolol  with rate improvement to NSR in the 90'-100s. Disposition:ICU  06/01: Admit to ICU with Acute respiratory failure requiring intubation and found to have DKA in the setting of medication non-compliance and cocaine use.  06/02: Pt successfully extubated to RA 06/03: Pt intermittently agitated overnight.  Pt now calm stable on RA and stated she now wants help with polysubstance abuse.  [facility] will transfer to stepdown unit and if she remains stable will transfer service to [facility] to assume care on 06/04 6/4.  Medical team took over.  Patient crying while I was in the room with that she is going to end up dead if she goes home secondary to going back on drugs.  Psychiatry consultation.  TOC to give information about rehabs. 6/5.  Patient's mood much better today.  Seen by psychiatry yesterday.  Patient confident that she is going to do better at home.  She states that she has all of her usual medications except for  gabapentin .  Gabapentin  refilled.

## 2023-12-14 NOTE — Assessment & Plan Note (Signed)
 Initial hyperkalemia than hypokalemia, hypophosphatemia and hypomagnesemia.

## 2023-12-14 NOTE — Progress Notes (Signed)
 Progress Note   Patient: Pamela Lane FAO:[id number] DOB: [birth year] DOA: 12/11/2023     3 DOS: the patient was seen and examined on 12/14/2023   Brief hospital course: 63 y.o female with significant PMH of uncontrolled diabetes mellitus, recurrent DKA, hypertension, hyperlipidemia, asthma, anxiety and depression, bipolar disorder, CKD stage III, SVT, C. difficile colitis and polysubstance abuse who presented to the ED with chief complaints of breath and hypoxia with a glucose reading of high per EMS    ED Course: Initial vital signs showed HR of 180 beats/minute, BP133/83 mm Hg, the RR 30 breaths/minute, and the oxygen saturation 98% on vent and a temperature of 98.36F (37C).  Pertinent Labs/Diagnostics Findings: Na+/ K+: 132/7.0 Glucose: 683 BUN/Cr.:27/2.01  WBC: 12.8 K/L  UDS +cocaine UA+ketones Beta Hydroxy elevated COVID PCR: Negative,  VBG: pO2 46; pCO2 27; pH 6.95;  HCO3 5.4, %O2 Sat 68.8.  CXR> see results Medication administered in the ON:[id number] went into svt sustaining rates upto 190"s with unstable BP, received Adenosine  6mg , Defibrillated x 10 and received 2 amps of sodium Bicarb, calcum gluconate, IV insulin  10 units x 2 and IV Metoprolol  with rate improvement to NSR in the 90'-100s. Disposition:ICU  06/01: Admit to ICU with Acute respiratory failure requiring intubation and found to have DKA in the setting of medication non-compliance and cocaine use.  06/02: Pt successfully extubated to RA 06/03: Pt intermittently agitated overnight.  Pt now calm stable on RA and stated she now wants help with polysubstance abuse.  VSS will transfer to stepdown unit and if she remains stable will transfer service to [facility] to assume care on 06/04 6/4.  Medical team took over.  Patient crying while I was in the room with that she is going to end up dead if she goes home secondary to going back on drugs.  Psychiatry consultation.  TOC to give information about rehabs.  Assessment and Plan: *  Acute respiratory failure with hypoxia and hypercapnia (HCC) Patient extubated on 6/2.  Now on room air.  DKA (diabetic ketoacidosis) (HCC) Discontinue steroids.  Patient currently on 5 units plus sliding scale short acting insulin  and Semglee  insulin  55 units daily.  Labile mood Patient crying today.  Will get psychiatric consultation.  Patient states that she will end up dead if she goes home.  She states she will go back to drug use.  TOC did give her some resources to look into rehabs.  Cocaine abuse (HCC) Crack cocaine abuse.  Patient interested in rehab.  Obesity (BMI 30-39.9) BMI 30.69  Electrolyte abnormality Initial hyperkalemia than hypokalemia, hypophosphatemia and hypomagnesemia.  Pseudohyponatremia Secondary to DKA  Acute metabolic encephalopathy Secondary to drug use  Hypokalemia Earlier in hospital course.  Initial hyperkalemia.  AKI (acute kidney injury) (HCC) Resolved        Subjective: Patient crying in front of me.  Does not want to go home because she is can go back to drugs.  States she is can end up dead if she does go home.  Family looking into rehabs.  TOC gave some information about rehabs.  Physical Exam: Vitals:   12/13/23 2017 12/14/23 0002 12/14/23 0422 12/14/23 0739  BP:  122/78 (!) 141/68 (!) 163/95  Pulse:   92 95  Resp:    18  Temp:  98.3 F (36.8 C) 97.6 F (36.4 C) 98.3 F (36.8 C)  TempSrc:      SpO2: 95% 96% 94% 97%  Weight:      Height:  Physical Exam HENT:     Head: Normocephalic.     Mouth/Throat:     Pharynx: No oropharyngeal exudate.  Eyes:     General: Lids are normal.     Conjunctiva/sclera: Conjunctivae normal.  Cardiovascular:     Rate and Rhythm: Normal rate and regular rhythm.     Heart sounds: Normal heart sounds, S1 normal and S2 normal.  Pulmonary:     Breath sounds: No decreased breath sounds, wheezing, rhonchi or rales.  Abdominal:     Palpations: Abdomen is soft.     Tenderness: There is no  abdominal tenderness.  Musculoskeletal:     Right lower leg: No swelling.     Left lower leg: No swelling.  Skin:    General: Skin is warm.     Findings: No rash.  Neurological:     Mental Status: She is alert.  Psychiatric:        Mood and Affect: Affect is labile.     Data Reviewed: Creatinine 0.91, sodium 136, potassium 4.1, hemoglobin 10.7, platelet count 165  Family Communication: Left message for daughter  Disposition: Status is: Inpatient We will get psychiatric consultation today  Planned Discharge Destination: Home    Time spent: 28 minutes  Author: Verla Glaze, MD 12/14/2023 11:40 AM  For on call review [email or web address].

## 2023-12-14 NOTE — Assessment & Plan Note (Signed)
 Patient extubated on 6/2.  Now on room air.

## 2023-12-14 NOTE — Assessment & Plan Note (Addendum)
 Earlier in hospital course.  Initial hyperkalemia.

## 2023-12-14 NOTE — Assessment & Plan Note (Signed)
 Discontinue steroids.  Patient currently on 5 units plus sliding scale short acting insulin  and Semglee  insulin  55 units daily.

## 2023-12-14 NOTE — Progress Notes (Signed)
 Mobility Specialist - Progress Note   12/14/23 1203  Mobility  Activity Ambulated with assistance in hallway;Ambulated with assistance in room  Level of Assistance Standby assist, set-up cues, supervision of patient - no hands on  Assistive Device None  Distance Ambulated (ft) 200 ft  Activity Response Tolerated well  Mobility visit 1 Mobility  Mobility Specialist Start Time (ACUTE ONLY) 1058  Mobility Specialist Stop Time (ACUTE ONLY) 1109  Mobility Specialist Time Calculation (min) (ACUTE ONLY) 11 min   Mobility Specialist - Progress Note   Pre-mobility: HR, BP, SpO2 During mobility: HR, BP, SpO2 Post-mobility: HR, BP, SPO2

## 2023-12-14 NOTE — Assessment & Plan Note (Signed)
 Secondary to DKA.  Sodium normalized upon discharge

## 2023-12-15 DIAGNOSIS — J9601 Acute respiratory failure with hypoxia: Secondary | ICD-10-CM | POA: Diagnosis not present

## 2023-12-15 DIAGNOSIS — E0810 Diabetes mellitus due to underlying condition with ketoacidosis without coma: Secondary | ICD-10-CM | POA: Diagnosis not present

## 2023-12-15 DIAGNOSIS — R4586 Emotional lability: Secondary | ICD-10-CM | POA: Diagnosis not present

## 2023-12-15 DIAGNOSIS — N179 Acute kidney failure, unspecified: Secondary | ICD-10-CM

## 2023-12-15 DIAGNOSIS — F1410 Cocaine abuse, uncomplicated: Secondary | ICD-10-CM | POA: Diagnosis not present

## 2023-12-15 DIAGNOSIS — R531 Weakness: Secondary | ICD-10-CM

## 2023-12-15 LAB — MAGNESIUM: Magnesium: 2.1 mg/dL (ref 1.7–2.4)

## 2023-12-15 LAB — CBC
HCT: 33 % — ABNORMAL LOW (ref 36.0–46.0)
Hemoglobin: 11.3 g/dL — ABNORMAL LOW (ref 12.0–15.0)
MCH: 30.3 pg (ref 26.0–34.0)
MCHC: 34.2 g/dL (ref 30.0–36.0)
MCV: 88.5 fL (ref 80.0–100.0)
Platelets: 176 10*3/uL (ref 150–400)
RBC: 3.73 MIL/uL — ABNORMAL LOW (ref 3.87–5.11)
RDW: 12.5 % (ref 11.5–15.5)
WBC: 6 10*3/uL (ref 4.0–10.5)
nRBC: 0 % (ref 0.0–0.2)

## 2023-12-15 LAB — PHOSPHORUS: Phosphorus: 3.4 mg/dL (ref 2.5–4.6)

## 2023-12-15 LAB — BASIC METABOLIC PANEL WITH GFR
Anion gap: 9 (ref 5–15)
BUN: 18 mg/dL (ref 8–23)
CO2: 24 mmol/L (ref 22–32)
Calcium: 8.3 mg/dL — ABNORMAL LOW (ref 8.9–10.3)
Chloride: 108 mmol/L (ref 98–111)
Creatinine, Ser: 0.77 mg/dL (ref 0.44–1.00)
GFR, Estimated: >60 mL/min (ref >60)
Glucose, Bld: 111 mg/dL — ABNORMAL HIGH (ref 70–99)
Potassium: 3.6 mmol/L (ref 3.5–5.1)
Sodium: 141 mmol/L (ref 135–145)

## 2023-12-15 LAB — GLUCOSE, CAPILLARY: Glucose-Capillary: 353 mg/dL — ABNORMAL HIGH (ref 70–99)

## 2023-12-15 MED ORDER — OXYCODONE HCL 5 MG PO TABS: 5.0000 mg | ORAL_TABLET | Freq: Four times a day (QID) | ORAL | 0 refills | Status: AC | PRN

## 2023-12-15 MED ORDER — GABAPENTIN 300 MG PO CAPS: 300.0000 mg | ORAL_CAPSULE | Freq: Three times a day (TID) | ORAL | 0 refills | Status: AC

## 2023-12-15 MED ORDER — INSULIN ASPART 100 UNIT/ML IJ SOLN: 10.0000 [IU] | Freq: Three times a day (TID) | INTRAMUSCULAR | Status: DC

## 2023-12-15 MED ORDER — INSULIN ASPART 100 UNIT/ML FLEXPEN: 10.0000 [IU] | PEN_INJECTOR | Freq: Three times a day (TID) | SUBCUTANEOUS | Status: AC

## 2023-12-15 NOTE — Progress Notes (Signed)
 Received MD order to discharge patient to home, I reviewed discharge instructions, homes meds, prescriptions and follow up appointment with patient and patient verbalized understanding.

## 2023-12-15 NOTE — TOC Transition Note (Signed)
 Transition of Care [facility]) - Discharge Note   Patient Details  Name: Pamela Lane MRN: [id number] Date of Birth: 1961-06-29  Transition of [facility] CM/SW Contact:  Crayton Docker, RN 12/15/2023, 10:53 AM   Clinical Narrative:      Discharge orders noted for home/self care. Per Dr. Clelia Current, ambulatory referral to Physical Therapy placed. Private transportation arranged. DME: rolling walker delivered to bedside per [employer].   Final next level of care: OP Rehab Barriers to Discharge: No Barriers Identified   Patient Goals and CMS Choice    OP Rehab  Discharge Placement     OP Rehab DME: Rolling Walker          Discharge Plan and Services Additional resources added to the After Visit Summary for     Social Drivers of Health (SDOH) Interventions SDOH Screenings   Food Insecurity: No Food Insecurity (10/12/2023)  Housing: Low Risk  (10/12/2023)  Transportation Needs: Patient Unable To Answer (12/11/2023)  Utilities: Not At Risk (10/12/2023)  Depression (PHQ2-9): Low Risk  (09/21/2023)  Social Connections: Unknown (10/12/2023)  Recent Concern: Social Connections - Moderately Isolated (09/12/2023)  Tobacco Use: High Risk (12/11/2023)     Readmission Risk Interventions    10/13/2023   11:37 AM 09/15/2023    9:56 AM 04/11/2023    2:42 PM  Readmission Risk Prevention Plan  Transportation Screening Complete Complete Complete  PCP or Specialist Appt within 3-5 Days  Complete Complete  HRI or Home Care Consult  Complete   Social Work Consult for Recovery Care Planning/Counseling  Complete Complete  Palliative Care Screening  Not Applicable Not Applicable  Medication Review [employer]) Complete Referral to Pharmacy Complete  PCP or Specialist appointment within 3-5 days of discharge Complete    SW Recovery Care/Counseling Consult Complete    Palliative Care Screening Not Applicable    Skilled Nursing Facility Not Applicable

## 2023-12-15 NOTE — Inpatient Diabetes Management (Addendum)
 Inpatient Diabetes Program Recommendations  AACE/ADA: New Consensus Statement on Inpatient Glycemic Control   Target Ranges:  Prepandial:   less than 140 mg/dL      Peak postprandial:   less than 180 mg/dL (1-2 hours)      Critically ill patients:  140 - 180 mg/dL    Latest Reference Range & Units 12/14/23 07:40 12/14/23 11:27 12/14/23 16:26 12/14/23 20:56  Glucose-Capillary 70 - 99 mg/dL 098 (H) 119 (H) 147 (H) 180 (H)   Review of Glycemic Control  Diabetes history: DM2 Outpatient Diabetes medications: Lantus  55 units daily, Novolog  15 units TID with meals, Januvia  100 mg daily Current orders for Inpatient glycemic control: Semgele 55 units daily, Novolog  0-20 units AC&HS, Novolog  5 units TID with meals  Inpatient Diabetes Program Recommendations:    Insulin : Please consider increasing meal coverage to Novolog  10 units TID with meals.  Thanks, Beacher Limerick, RN, MSN, CDCES Diabetes Coordinator Inpatient Diabetes Program [phone] (Team Pager from 8am to 5pm)

## 2023-12-15 NOTE — Discharge Summary (Signed)
 Physician Discharge Summary   Patient: Pamela Lane MRN: [id number] DOB: [birth year]  Admit date:     12/11/2023  Discharge date: 12/15/23  Discharge Physician: Verla Glaze   PCP: [facility]   Recommendations at discharge:   Follow-up PCP 5 days  Discharge Diagnoses: Principal Problem:   Acute respiratory failure with hypoxia and hypercapnia (HCC) Active Problems:   DKA (diabetic ketoacidosis) (HCC)   Labile mood   Cocaine abuse (HCC)   AKI (acute kidney injury) (HCC)   Hypokalemia   Acute metabolic encephalopathy   Pseudohyponatremia   Electrolyte abnormality   Obesity (BMI 30-39.9)    Hospital Course: 63 y.o female with significant PMH of uncontrolled diabetes mellitus, recurrent DKA, hypertension, hyperlipidemia, asthma, anxiety and depression, bipolar disorder, CKD stage III, SVT, C. difficile colitis and polysubstance abuse who presented to the ED with chief complaints of breath and hypoxia with a glucose reading of high per EMS    ED Course: Initial vital signs showed HR of 180 beats/minute, BP133/83 mm Hg, the RR 30 breaths/minute, and the oxygen saturation 98% on vent and a temperature of 98.48F (37C).  Pertinent Labs/Diagnostics Findings: Na+/ K+: 132/7.0 Glucose: 683 BUN/Cr.:27/2.01  WBC: 12.8 K/L  UDS +cocaine UA+ketones Beta Hydroxy elevated COVID PCR: Negative,  VBG: pO2 46; pCO2 27; pH 6.95;  HCO3 5.4, %O2 Sat 68.8.  CXR> see results Medication administered in the WU:[id number] went into svt sustaining rates upto 190"s with unstable BP, received Adenosine  6mg , Defibrillated x 10 and received 2 amps of sodium Bicarb, calcum gluconate, IV insulin  10 units x 2 and IV Metoprolol  with rate improvement to NSR in the 90'-100s. Disposition:ICU  06/01: Admit to ICU with Acute respiratory failure requiring intubation and found to have DKA in the setting of medication non-compliance and cocaine use.  06/02: Pt successfully extubated to  RA 06/03: Pt intermittently agitated overnight.  Pt now calm stable on RA and stated she now wants help with polysubstance abuse.  VSS will transfer to stepdown unit and if she remains stable will transfer service to [facility] to assume care on 06/04 6/4.  Medical team took over.  Patient crying while I was in the room with that she is going to end up dead if she goes home secondary to going back on drugs.  Psychiatry consultation.  TOC to give information about rehabs. 6/5.  Patient's mood much better today.  Seen by psychiatry yesterday.  Patient confident that she is going to do better at home.  She states that she has all of her usual medications except for gabapentin .  Gabapentin  refilled.  Assessment and Plan: * Acute respiratory failure with hypoxia and hypercapnia (HCC) Patient extubated on 6/2.  Now on room air.  This problem has resolved.  DKA (diabetic ketoacidosis) (HCC) Discontinue steroids.  Patient currently on 10 units short acting insulin  and Semglee  insulin  55 units daily.  Patient states that she has all of her diabetic supplies and insulin  at home.  Labile mood Patient's mood better today.  She had a good conversation with psychiatrist yesterday.  She will continue Wellbutrin  for now.  Consider mood stabilizer as outpatient.  Cocaine abuse (HCC) Crack cocaine abuse.  Patient to look into rehabs at home.  Patient states that her sister is going to come into town tomorrow to help support her.  Patient confident that she will go home and stay away from the drugs.  Obesity (BMI 30-39.9) BMI 31.73  Electrolyte abnormality Initial hyperkalemia than hypokalemia, hypophosphatemia and  hypomagnesemia.  Pseudohyponatremia Secondary to DKA.  Sodium normalized upon discharge  Acute metabolic encephalopathy Secondary to drug use  Hypokalemia Earlier in hospital course.  Initial hyperkalemia.  AKI (acute kidney injury) [facility]) Resolved         Consultants: Patient was on the  critical care service initially.  Psychiatry. Procedures performed: Intubation and extubation Disposition: Home Diet recommendation:  Cardiac and Carb modified diet DISCHARGE MEDICATION: Allergies as of 12/15/2023   No Known Allergies      Medication List     TAKE these medications    Accu-Chek Softclix Lancets lancets Use 3 (three) times daily to check blood sugar   atorvastatin  40 MG tablet Commonly known as: LIPITOR Take 1 tablet (40 mg total) by mouth at bedtime.   Blood Glucose Monitor System w/Device Kit Use 3 (three) times daily to check blood sugar.   BLOOD GLUCOSE TEST STRIPS Strp Use 3 (three) times daily to check blood sugar.   buPROPion  150 MG 12 hr tablet Commonly known as: WELLBUTRIN  SR Take 1 tablet (150 mg total) by mouth 2 (two) times daily.   Dextromethorphan -guaiFENesin  20-200 MG/20ML Liqd Take 10 mLs by mouth every 4 (four) hours as needed.   diltiazem  120 MG 24 hr capsule Commonly known as: CARDIZEM  CD Take 120 mg by mouth daily.   gabapentin  300 MG capsule Commonly known as: NEURONTIN  Take 1 capsule (300 mg total) by mouth 3 (three) times daily.   insulin  aspart 100 UNIT/ML FlexPen Commonly known as: NOVOLOG  Inject 10 Units into the skin 3 (three) times daily with meals.   insulin  glargine 100 UNIT/ML Solostar Pen Commonly known as: LANTUS  Inject 55 Units into the skin daily.   Insulin  Pen Needle 31G X 8 MM Misc Use 5 times daily with Lantus  and Novolog    multivitamin with minerals Tabs tablet Take 1 tablet by mouth daily.   omeprazole  20 MG capsule Commonly known as: PRILOSEC  Take 1 capsule (20 mg total) by mouth daily.   oxyCODONE  5 MG immediate release tablet Commonly known as: Oxy IR/ROXICODONE  Take 1 tablet (5 mg total) by mouth every 6 (six) hours as needed for severe pain (pain score 7-10).   sitaGLIPtin  100 MG tablet Commonly known as: Januvia  Take 1 tablet (100 mg total) by mouth daily.               Durable  Medical Equipment  (From admission, onward)           Start     Ordered   12/14/23 1021  For home use only DME Walker rolling  Once       Question Answer Comment  Walker: With 5 Inch Wheels   Patient needs a walker to treat with the following condition Generalized weakness      12/14/23 1020            Follow-up Information     [facility] Follow up.   Why: 06/04---Per Sam Creighton, will process rolling walker order for bedside delivery Contact information: [address] [place] [address] [phone]         [facility]. Go on 12/20/2023.   Specialty: General Practice Why: @3 :00pm Contact information: [address] [place] [address] [phone]                Discharge Exam: Filed Weights   12/12/23 0400 12/13/23 0500 12/15/23 0500  Weight: 76.9 kg 76.1 kg 78.7 kg   Physical Exam HENT:  Head: Normocephalic.     Mouth/Throat:     Pharynx: No oropharyngeal exudate.  Eyes:     General: Lids are normal.     Conjunctiva/sclera: Conjunctivae normal.  Cardiovascular:     Rate and Rhythm: Normal rate and regular rhythm.     Heart sounds: Normal heart sounds, S1 normal and S2 normal.  Pulmonary:     Breath sounds: No decreased breath sounds, wheezing, rhonchi or rales.  Abdominal:     Palpations: Abdomen is soft.     Tenderness: There is no abdominal tenderness.  Musculoskeletal:     Right lower leg: No swelling.     Left lower leg: No swelling.  Skin:    General: Skin is warm.     Findings: No rash.  Neurological:     Mental Status: She is alert.      Condition at discharge: stable  The results of significant diagnostics from this hospitalization (including imaging, microbiology, ancillary and laboratory) are listed below for reference.   Imaging Studies: DG Chest Port 1 View Result Date: 12/12/2023 CLINICAL DATA:  Acute respiratory failure EXAM: PORTABLE CHEST 1 VIEW COMPARISON:   Chest x-ray 12/14/2023 FINDINGS: Left-sided central venous catheter tip projects over the distal SVC. The heart size and mediastinal contours are within normal limits. Both lungs are clear. The visualized skeletal structures are unremarkable. IMPRESSION: No active disease. Electronically Signed   By: Tyron Gallon M.D.   On: 12/12/2023 17:20   DG Chest 1 View Result Date: 12/11/2023 CLINICAL DATA:  [id number].  Encounter for central line placement. EXAM: CHEST  1 VIEW COMPARISON:  Portable chest today at 4:42 a.m. FINDINGS: 7:27 a.m. left IJ central line terminates at the superior cavoatrial junction. No pneumothorax. ETT tip is 4 cm from the carina, NGT passes well into the stomach but the side hole and tip are not filmed. There are overlying electrical pads and telemetry leads. The lungs are clear. The cardiomediastinal silhouette and vascular pattern are normal. There is no substantial pleural effusion. No acute osseous findings. IMPRESSION: 1. New left IJ central line terminates at the superior cavoatrial junction. No pneumothorax. 2. ETT tip 4 cm from the carina. 3. NGT passes well into the stomach but the side hole and tip are not filmed. 4. No acute radiographic chest findings. Electronically Signed   By: Denman Fischer M.D.   On: 12/11/2023 07:37   DG Abdomen 1 View Result Date: 12/11/2023 CLINICAL DATA:  63 year old female with shortness of breath, intubated. Enteric tube placed. EXAM: ABDOMEN - 1 VIEW COMPARISON:  CT Abdomen and Pelvis 09/12/2023. FINDINGS: Portable AP supine view at 0443 hours. Enteric tube terminates in the stomach, side hole the level of the gastric fundus. Visible bowel-gas pattern within normal limits. Visible lungs are clear. Left chest pacer/resuscitation pads. No acute osseous abnormality identified. IMPRESSION: Satisfactory enteric tube placement into the stomach. Electronically Signed   By: Marlise Simpers M.D.   On: 12/11/2023 05:28   DG Chest Port 1 View Result Date:  12/11/2023 CLINICAL DATA:  63 year old female with shortness of breath, intubated. EXAM: PORTABLE CHEST 1 VIEW COMPARISON:  Portable chest 10/12/2023 and earlier. FINDINGS: Portable AP supine view at 0442 hours. Endotracheal tube visible in the midline and appears to terminates between the clavicles and carina, other external artifact obscuring the tip. Left chest pacer/resuscitation pads in place. An enteric tube courses to the left upper quadrant in the side hole is visible at the level of the gastric fundus. Large lung volumes. Allowing  for portable technique the lungs are clear. Normal cardiac size and mediastinal contours. Mild to moderate gaseous distension of the stomach. No acute osseous abnormality identified. IMPRESSION: 1. Satisfactory endotracheal and enteric tube placement. 2. No acute cardiopulmonary abnormality. Electronically Signed   By: Marlise Simpers M.D.   On: 12/11/2023 05:27    Microbiology: Results for orders placed or performed during the hospital encounter of 12/11/23  Culture, blood (Routine x 2)     Status: None (Preliminary result)   Collection Time: 12/11/23  3:49 AM   Specimen: BLOOD  Result Value Ref Range Status   Specimen Description BLOOD A-LINE DRAW  Final   Special Requests   Final    BOTTLES DRAWN AEROBIC AND ANAEROBIC Blood Culture adequate volume   Culture   Final    NO GROWTH 4 DAYS Performed at [facility], [address] [place], [place] [address]    Report Status PENDING  Incomplete  Resp panel by RT-PCR (RSV, Flu A&B, Covid) Anterior Nasal Swab     Status: None   Collection Time: 12/11/23  4:00 AM   Specimen: Anterior Nasal Swab  Result Value Ref Range Status   SARS Coronavirus 2 by RT PCR NEGATIVE NEGATIVE Final    Comment: (NOTE) SARS-CoV-2 target nucleic acids are NOT DETECTED.  The SARS-CoV-2 RNA is generally detectable in upper respiratory specimens during the acute phase of infection. The lowest concentration of SARS-CoV-2 viral copies  this assay can detect is 138 copies/mL. A negative result does not preclude SARS-Cov-2 infection and should not be used as the sole basis for treatment or other patient management decisions. A negative result may occur with  improper specimen collection/handling, submission of specimen other than nasopharyngeal swab, presence of viral mutation(s) within the areas targeted by this assay, and inadequate number of viral copies(<138 copies/mL). A negative result must be combined with clinical observations, patient history, and epidemiological information. The expected result is Negative.  Fact Sheet for Patients:  BloggerCourse.com  Fact Sheet for Healthcare Providers:  SeriousBroker.it  This test is no t yet approved or cleared by the [place]  FDA and  has been authorized for detection and/or diagnosis of SARS-CoV-2 by FDA under an Emergency Use Authorization (EUA). This EUA will remain  in effect (meaning this test can be used) for the duration of the COVID-19 declaration under Section 564(b)(1) of the Act, 21 U.S.C.section 360bbb-3(b)(1), unless the authorization is terminated  or revoked sooner.       Influenza A by PCR NEGATIVE NEGATIVE Final   Influenza B by PCR NEGATIVE NEGATIVE Final    Comment: (NOTE) The Xpert Xpress SARS-CoV-2/FLU/RSV plus assay is intended as an aid in the diagnosis of influenza from Nasopharyngeal swab specimens and should not be used as a sole basis for treatment. Nasal washings and aspirates are unacceptable for Xpert Xpress SARS-CoV-2/FLU/RSV testing.  Fact Sheet for Patients: BloggerCourse.com  Fact Sheet for Healthcare Providers: SeriousBroker.it  This test is not yet approved or cleared by the [place]  FDA and has been authorized for detection and/or diagnosis of SARS-CoV-2 by FDA under an Emergency Use Authorization (EUA). This EUA  will remain in effect (meaning this test can be used) for the duration of the COVID-19 declaration under Section 564(b)(1) of the Act, 21 U.S.C. section 360bbb-3(b)(1), unless the authorization is terminated or revoked.     Resp Syncytial Virus by PCR NEGATIVE NEGATIVE Final    Comment: (NOTE) Fact Sheet for Patients: BloggerCourse.com  Fact Sheet for Healthcare Providers: SeriousBroker.it  This test is not yet approved or cleared by the [place]  FDA and has been authorized for detection and/or diagnosis of SARS-CoV-2 by FDA under an Emergency Use Authorization (EUA). This EUA will remain in effect (meaning this test can be used) for the duration of the COVID-19 declaration under Section 564(b)(1) of the Act, 21 U.S.C. section 360bbb-3(b)(1), unless the authorization is terminated or revoked.  Performed at [facility], [address] [place], [place] [address]   Culture, blood (Routine x 2)     Status: None (Preliminary result)   Collection Time: 12/11/23  4:11 AM   Specimen: BLOOD  Result Value Ref Range Status   Specimen Description BLOOD BLOOD RIGHT HAND  Final   Special Requests   Final    AEROBIC BOTTLE ONLY Blood Culture results may not be optimal due to an inadequate volume of blood received in culture bottles   Culture   Final    NO GROWTH 4 DAYS Performed at [facility], [address], [place], [place] [address]    Report Status PENDING  Incomplete  Urine Culture     Status: None   Collection Time: 12/11/23  4:26 AM   Specimen: Urine, Catheterized  Result Value Ref Range Status   Specimen Description   Final    URINE, CATHETERIZED Performed at [facility], [address], [place], [place] [address]    Special Requests   Final    NONE Performed at [facility], [address] [place], [place] [address]    Culture   Final    NO GROWTH Performed at [facility], [address], [place], [place] [address]    Report Status 12/12/2023 FINAL  Final  MRSA Next Gen by PCR, Nasal     Status: None   Collection Time: 12/11/23  6:42 AM   Specimen: Nasal Mucosa; Nasal Swab  Result Value Ref Range Status   MRSA by PCR Next Gen NOT DETECTED NOT DETECTED Final    Comment: (NOTE) The GeneXpert MRSA Assay (FDA approved for NASAL specimens only), is one component of a comprehensive MRSA colonization surveillance program. It is not intended to diagnose MRSA infection nor to guide or monitor treatment for MRSA infections. Test performance is not FDA approved in patients less than 33 years old. Performed at [facility], [address] [place], [place] [address]     Labs: CBC: Recent Labs  Lab 12/11/23 [phone] 12/11/23 0715 12/12/23 0439 12/13/23 0427 12/14/23 0325 12/15/23 0218  WBC 12.8* 25.0* 10.0 9.1 7.5 6.0  NEUTROABS 9.8*  --   --   --   --   --   HGB 16.7* 14.7 11.8* 10.9* 10.7* 11.3*  HCT 52.9* 44.0 33.4* 31.3* 31.5* 33.0*  MCV 95.8 92.4 85.6 87.7 88.0 88.5  PLT 348 347 187 156 165 176   Basic Metabolic Panel: Recent Labs  Lab 12/12/23 0439 12/12/23 0944 12/12/23 1739 12/13/23 0427 12/14/23 0325 12/15/23 0218  NA 141 140  --  139 136 141  K 3.5 2.8* 2.9* 3.6 4.1 3.6  CL 104 104  --  103 103 108  CO2 28 30  --  28 23 24   GLUCOSE 107* 155*  --  186* 337* 111*  BUN 23 21  --  14 18 18   CREATININE 1.08* 1.03*  --  0.92 0.90 0.77  CALCIUM  8.2* 8.2*  --  8.0* 8.1* 8.3*  MG 1.8  --   --  2.0  2.2 2.1  PHOS 1.6*  --  2.6 3.8 3.5 3.4   Liver Function Tests: Recent Labs  Lab 12/11/23 0349 12/11/23 0715  AST 15 20  ALT 15 17  ALKPHOS 130* 119  BILITOT 1.4* 1.8*  PROT 9.0* 7.8  ALBUMIN 4.7 4.0   CBG: Recent Labs  Lab 12/14/23 0740 12/14/23 1127 12/14/23 1626 12/14/23 2056 12/15/23 0757  GLUCAP 297* 327* 303* 180* 353*    Discharge time spent: greater than 30 minutes.  Signed: Verla Glaze,  MD [facility] 12/15/2023

## 2023-12-15 NOTE — Plan of Care (Signed)
 Patient remains free from any signs of acute distress.  Has verbally denied any pain/discomfort.  Remains free from any noted signs of associated symptoms.  Patient observed ambulating on hospital unit, without use of an assistive device.  Patient has remained free from falls.  No additional medical interventions required at this time.  Patient to continue to be monitored by hospital staff until discharged.

## 2023-12-16 LAB — CULTURE, BLOOD (ROUTINE X 2)
Culture: NO GROWTH
Culture: NO GROWTH
Special Requests: ADEQUATE

## 2023-12-22 ENCOUNTER — Encounter: Payer: MEDICAID | Attending: Internal Medicine | Admitting: Dietician

## 2023-12-22 DIAGNOSIS — E1122 Type 2 diabetes mellitus with diabetic chronic kidney disease: Secondary | ICD-10-CM | POA: Insufficient documentation

## 2023-12-22 DIAGNOSIS — Z794 Long term (current) use of insulin: Secondary | ICD-10-CM | POA: Diagnosis present

## 2023-12-22 NOTE — Patient Instructions (Signed)
 Keep eating a meal or snack every 3-5 hours during the day.  Make sure to eat a protein food with each meal, even if you don't eat meat. Fish such as tuna, eggs, cheese, yogurt, and peanut butter are good protein foods. Beans also count as protein and starch at the same time  Try plain or vanilla yogurt in a big container which is less costly than individual cups. Add some frozen or fresh fruit to the yogurt for a snack or a small meal. When reading food labels, check the total carbohydrate grams. Make sure that all foods you eat at a meal add up to no more than 45 grams, but eat at least 30 grams with each meal.  Choose low sodium canned foods

## 2023-12-22 NOTE — Progress Notes (Signed)
 Diabetes Self-Management Education  Visit Type:  Follow-up  Appt. Start Time: 1025 Appt. End Time: 1100  12/22/2023  Pamela Lane, identified by name and date of birth, is a 63 y.o. female with a diagnosis of Diabetes:  .   ASSESSMENT  There were no vitals taken for this visit. There is no height or weight on file to calculate BMI.    Diabetes Self-Management Education - 12/22/23 1000       Complications   How often do you check your blood sugar? 1-2 times/day    Fasting Blood glucose range (mg/dL) 952-841;32-440;102-725;>366   95-high   Postprandial Blood glucose range (mg/dL) 44-034   74-QVZD     Dietary Intake   Breakfast egg sandwich on toast, eats 1/2    Snack (morning) finish sandwich    Lunch prepared salad with deli meat and cheese    Snack (afternoon) peanut butter crackers (packaged)    Dinner hamburger; manwich sometimes with veg  / fries   veg    Snack (evening) pkg crackers or small portion chips    Beverage(s) water , green tea      Activity / Exercise   Activity / Exercise Type Light (walking / raking leaves)    How many days per week do you exercise? 7    How many minutes per day do you exercise? 15    Total minutes per week of exercise 105      Patient Education   Healthy Eating Plate Method;Meal timing in regards to the patients' current diabetes medication.;Food label reading, portion sizes and measuring food.    Being Active Identified with patient nutritional and/or medication changes necessary with exercise.      Individualized Goals (developed by patient)   Nutrition General guidelines for healthy choices and portions discussed    Physical Activity 15 minutes per day    Medications take my medication as prescribed      Post-Education Assessment   Patient understands the diabetes disease and treatment process. Comprehends key points    Patient understands incorporating nutritional management into lifestyle. Comprehends key points     Patient undertands incorporating physical activity into lifestyle. Comprehends key points    Patient understands using medications safely. Comphrehends key points    Patient understands monitoring blood glucose, interpreting and using results Comprehends key points    Patient understands prevention, detection, and treatment of acute complications. Comprehends key points    Patient understands prevention, detection, and treatment of chronic complications. Comprehends key points    Patient understands how to develop strategies to address psychosocial issues. Comprehends key points    Patient understands how to develop strategies to promote Lane/change behavior. Comprehends key points      Outcomes   Program Status Completed          Learning Objective:  Patient will have a greater understanding of diabetes self-management. Patient education plan is to attend individual and/or group sessions per assessed needs and concerns.   Plan:   Patient Instructions  Keep eating a meal or snack every 3-5 hours during the day.  Make sure to eat a protein food with each meal, even if you don't eat meat. Fish such as tuna, eggs, cheese, yogurt, and peanut butter are good protein foods. Beans also count as protein and starch at the same time  Try plain or vanilla yogurt in a big container which is less costly than individual cups. Add some frozen or fresh fruit to the yogurt for a snack  or a small meal. When reading food labels, check the total carbohydrate grams. Make sure that all foods you eat at a meal add up to no more than 45 grams, but eat at least 30 grams with each meal.  Choose low sodium canned foods   Expected Outcomes:  Demonstrated interest in learning. Expect positive outcomes  If problems or questions, patient to contact team via:  Phone  Future DSME appointment: - PRN (she reports PCP visits every 2 weeks at this time)
